# Patient Record
Sex: Female | Born: 1965 | Race: White | Hispanic: No | Marital: Married | Smoking: Never smoker
Health system: Southern US, Community
[De-identification: ages and names within clinical notes are randomized; demographics above are authoritative.]

## PROBLEM LIST (undated history)

## (undated) DIAGNOSIS — I10 Essential (primary) hypertension: Secondary | ICD-10-CM

## (undated) DIAGNOSIS — L259 Unspecified contact dermatitis, unspecified cause: Secondary | ICD-10-CM

## (undated) DIAGNOSIS — T4145XA Adverse effect of unspecified anesthetic, initial encounter: Secondary | ICD-10-CM

## (undated) DIAGNOSIS — Z8042 Family history of malignant neoplasm of prostate: Secondary | ICD-10-CM

## (undated) DIAGNOSIS — Z923 Personal history of irradiation: Secondary | ICD-10-CM

## (undated) DIAGNOSIS — L719 Rosacea, unspecified: Secondary | ICD-10-CM

## (undated) DIAGNOSIS — C801 Malignant (primary) neoplasm, unspecified: Secondary | ICD-10-CM

## (undated) DIAGNOSIS — R109 Unspecified abdominal pain: Secondary | ICD-10-CM

## (undated) DIAGNOSIS — N949 Unspecified condition associated with female genital organs and menstrual cycle: Secondary | ICD-10-CM

## (undated) DIAGNOSIS — M545 Low back pain, unspecified: Secondary | ICD-10-CM

## (undated) DIAGNOSIS — E782 Mixed hyperlipidemia: Secondary | ICD-10-CM

## (undated) DIAGNOSIS — F411 Generalized anxiety disorder: Secondary | ICD-10-CM

## (undated) DIAGNOSIS — Z808 Family history of malignant neoplasm of other organs or systems: Secondary | ICD-10-CM

## (undated) DIAGNOSIS — E7800 Pure hypercholesterolemia, unspecified: Secondary | ICD-10-CM

## (undated) DIAGNOSIS — Z9221 Personal history of antineoplastic chemotherapy: Secondary | ICD-10-CM

## (undated) DIAGNOSIS — K219 Gastro-esophageal reflux disease without esophagitis: Secondary | ICD-10-CM

## (undated) DIAGNOSIS — Z800 Family history of malignant neoplasm of digestive organs: Secondary | ICD-10-CM

## (undated) DIAGNOSIS — R319 Hematuria, unspecified: Secondary | ICD-10-CM

## (undated) DIAGNOSIS — T8859XA Other complications of anesthesia, initial encounter: Secondary | ICD-10-CM

## (undated) HISTORY — DX: Unspecified contact dermatitis, unspecified cause: L25.9

## (undated) HISTORY — PX: DIAGNOSTIC LAPAROSCOPY: SUR761

## (undated) HISTORY — DX: Family history of malignant neoplasm of prostate: Z80.42

## (undated) HISTORY — DX: Rosacea, unspecified: L71.9

## (undated) HISTORY — DX: Hematuria, unspecified: R31.9

## (undated) HISTORY — DX: Family history of malignant neoplasm of other organs or systems: Z80.8

## (undated) HISTORY — DX: Mixed hyperlipidemia: E78.2

## (undated) HISTORY — DX: Unspecified condition associated with female genital organs and menstrual cycle: N94.9

## (undated) HISTORY — DX: Essential (primary) hypertension: I10

## (undated) HISTORY — PX: MASTECTOMY: SHX3

## (undated) HISTORY — DX: Generalized anxiety disorder: F41.1

## (undated) HISTORY — PX: UTERINE FIBROID SURGERY: SHX826

## (undated) HISTORY — DX: Pure hypercholesterolemia, unspecified: E78.00

## (undated) HISTORY — PX: LAPAROSCOPIC APPENDECTOMY: SUR753

## (undated) HISTORY — PX: EYE SURGERY: SHX253

## (undated) HISTORY — DX: Family history of malignant neoplasm of digestive organs: Z80.0

## (undated) HISTORY — DX: Unspecified abdominal pain: R10.9

---

## 2002-03-14 ENCOUNTER — Ambulatory Visit (HOSPITAL_COMMUNITY): Admission: RE | Admit: 2002-03-14 | Discharge: 2002-03-14 | Payer: Self-pay | Admitting: Obstetrics and Gynecology

## 2002-03-14 ENCOUNTER — Encounter (INDEPENDENT_AMBULATORY_CARE_PROVIDER_SITE_OTHER): Payer: Self-pay | Admitting: Specialist

## 2003-01-18 ENCOUNTER — Other Ambulatory Visit: Admission: RE | Admit: 2003-01-18 | Discharge: 2003-01-18 | Payer: Self-pay | Admitting: Obstetrics and Gynecology

## 2003-12-27 ENCOUNTER — Inpatient Hospital Stay (HOSPITAL_COMMUNITY): Admission: AD | Admit: 2003-12-27 | Discharge: 2003-12-29 | Payer: Self-pay | Admitting: Obstetrics and Gynecology

## 2003-12-30 ENCOUNTER — Inpatient Hospital Stay (HOSPITAL_COMMUNITY): Admission: AD | Admit: 2003-12-30 | Discharge: 2004-01-03 | Payer: Self-pay | Admitting: Obstetrics and Gynecology

## 2003-12-31 ENCOUNTER — Encounter (INDEPENDENT_AMBULATORY_CARE_PROVIDER_SITE_OTHER): Payer: Self-pay | Admitting: Specialist

## 2004-01-19 ENCOUNTER — Other Ambulatory Visit: Admission: RE | Admit: 2004-01-19 | Discharge: 2004-01-19 | Payer: Self-pay | Admitting: Obstetrics and Gynecology

## 2005-02-26 ENCOUNTER — Other Ambulatory Visit: Admission: RE | Admit: 2005-02-26 | Discharge: 2005-02-26 | Payer: Self-pay | Admitting: Obstetrics and Gynecology

## 2006-01-27 ENCOUNTER — Observation Stay (HOSPITAL_COMMUNITY): Admission: EM | Admit: 2006-01-27 | Discharge: 2006-01-28 | Payer: Self-pay | Admitting: Emergency Medicine

## 2006-01-27 ENCOUNTER — Encounter (INDEPENDENT_AMBULATORY_CARE_PROVIDER_SITE_OTHER): Payer: Self-pay | Admitting: *Deleted

## 2009-10-25 ENCOUNTER — Emergency Department (HOSPITAL_BASED_OUTPATIENT_CLINIC_OR_DEPARTMENT_OTHER): Admission: EM | Admit: 2009-10-25 | Discharge: 2009-10-25 | Payer: Self-pay | Admitting: Emergency Medicine

## 2010-08-18 ENCOUNTER — Encounter: Payer: Self-pay | Admitting: Emergency Medicine

## 2010-11-13 ENCOUNTER — Ambulatory Visit
Admission: RE | Admit: 2010-11-13 | Discharge: 2010-11-13 | Disposition: A | Discharge status: Home / Self Care | Payer: BC Managed Care – PPO | Source: Ambulatory Visit | Attending: Family Medicine | Admitting: Family Medicine

## 2010-11-13 ENCOUNTER — Other Ambulatory Visit: Payer: Self-pay | Admitting: Family Medicine

## 2010-11-13 DIAGNOSIS — S161XXA Strain of muscle, fascia and tendon at neck level, initial encounter: Secondary | ICD-10-CM

## 2010-12-13 NOTE — Op Note (Signed)
Claire Guzman, Claire Guzman                      ACCOUNT NO.:  [id number]   MEDICAL RECORD NO.:  [id number]                   PATIENT TYPE:  INP   LOCATION:  9305                                 FACILITY:  [facility]   PHYSICIAN:  Randye Lobo, M.D.                DATE OF BIRTH:  11/28/1965   DATE OF PROCEDURE:  12/31/2003  DATE OF DISCHARGE:                                 OPERATIVE REPORT   PREOPERATIVE DIAGNOSES:  1. Intrauterine gestation at 32 plus six weeks.  2. Preterm labor, failed tocolysis.  3. Homero Fellers breech presentation.  4. Chronic hypertension.  5. Oligohydramnios.   POSTOPERATIVE DIAGNOSES:  1. Intrauterine gestation at 32 plus six weeks.  2. Preterm labor, failed tocolysis.  3. Homero Fellers breech presentation.  4. Chronic hypertension.  5. Oligohydramnios.   PROCEDURE:  Primary low segment transverse cesarean section, with T incision  extension both superiorly and inferiorly.   SURGEON:  Randye Lobo, M.D.   ANESTHESIA:  Spinal with __________, serofluorane inhalation, 3% Nesacaine  intraperitoneal bath.   IV FLUIDS:  2200 cc Ringer's lactate.   ESTIMATED BLOOD LOSS:  1000 cc.   URINE OUTPUT:  900 cc.   COMPLICATIONS:  None.   INDICATIONS FOR PROCEDURE:  The patient is a 45 year old gravida 3, para 0-0-  2-0 Caucasian female who was admitted on December 30, 2003 with uterine  contractions.  The patient had been recently discharged from the [facility] on December 29, 2003, at which time she had treatment for  preterm labor and a course of betamethasone.  The patient's antepartum  course had also been significant for chronic hypertension, for which she was  receiving Aldomet and she also had advanced maternal age status; declining  amniocentesis.  On admission, her cervix was noted to be closed and she was  having contractions every 4-10 mins.  The patient did receive terbutaline  subcutaneously and then oral Procardia, which did not lessen her  contractions.  She was therefore admitted for magnesium sulfate.  The  patient did have an OB ultrasound documenting an estimated fetal weight of  2180 to 2306 g, consistent with a 90-95th percentile.  The amniotic fluid  index was noted to be 7.9, and the fetal presentation was noted to be frank  breech.  The patient's group B strep culture from her prior admission was  noted to be negative.   The patient was treated with magnesium sulfate and Stadol for control of her  contractions.  At one point the magnesium sulfate needed to be decreased due  to the patient's respiratory symptoms.  The patient was also treated with  ampicillin intravenously for antibiotic prophylaxis due to the preterm  status.   The patient's contractions did stabilize, but returned again on December 31, 2003; she developed bloody show.  The patient was checked and her cervix was  noted to be 3-4 cm dilated and 100% effaced,  with the membranes intact.  The  breech presentation was confirmed by ultrasound examination.  The patient  was diagnosed with preterm labor, which had failed tocolysis.  A plan was  then made to proceed at this time with a primary cesarean section -- after  the risks, benefits and alternatives were discussed with the patient and her  husband.  They chose to proceed.   FINDINGS:  A viable female infant was delivered at 67.  The position was  frank breech.  The Apgar's were 6 at one minute and 8 at five minutes.  The  weight was later noted to be 4 pounds 1 ounce.  The amniotic fluid was  clear.  The uterus, tubes and ovaries were normal.  The lower uterine  segment was noted to be thick and the baby had the breech low in the pelvis.  The baby was escorted to the newborn nursery in vigorous condition, and did  not require intubation.  The cord pH was 7.31.   The patient will need cesarean section and avoidance of labor for future  deliveries.   SPECIMENS:  The placenta was sent to  pathology.   DESCRIPTION OF PROCEDURE:  The patient was reidentified and her antenatal  _________ was taken down to the operating room.  The patient did receive a  spinal anesthetic and was then placed in the supine position with a left  lateral tilt.  The abdomen was sterilely prepped and a Foley catheter was  placed in the bladder.  She was sterilely draped.   A Pfannenstiel skin incision was created sharply with the scalpel.  This was  carried down to the fascia using monopolar cautery.  The fascia was incised  in the midline with a scalpel, and the incision was extended bilaterally  using the Mayo scissors.  Hemostasis was created in the subcutaneous tissue  with monopolar cautery.  Then the rectus muscles were dissected off of the  overlying fascia, and the rectus muscles were then sharply divided in the  midline.  The parietoperitoneum was grasped with hemostat clamps and was  entered sharply.  The incision was extended cranially and caudally, and a  small extension was also created in the peritoneum off to the right hand  side.   The lower uterine segment was exposed with a bladder retractor, and a  bladder flap was created.  A transverse lower uterine segment incision was  performed sharply with a scalpel.  The myometrium was noted to be quite  thick in this region.  The uterine cavity was entered bluntly and the  incision was extended bilaterally in an upward fashion, using the bandage  scissors.  A hand was inserted through the uterine incision and the breech  was noted to be low, and the uterus was contracting around the fetus.  The  uterine incision was extended superiorly in a T-shaped fashion, using the  bandage scissors.  This did not provide adequate uterine relaxation, and so  the uterine incision was extended inferiorly similarly with the T-incision,  while nitroglycerin 400 mcg sublingually were administered by the anesthesia team.  The breech was then elevated up  through the uterine incision and each  of the legs were delivered, followed by each of the arms which were rotated  across the torso.  The head was delivered last.  The nares and mouth were  suctioned and the cord was doubly clamped and cut; the newborn was carried  over to the awaiting pediatricians.  A cord  pH and cord blood were obtained,  and the patient did receive Ancef 1 g intravenously.  The placenta was  manually extracted and was sent to pathology.   The uterus was exteriorized for its closure.  The superior T-extension was  closed with a running, locked suture of #1 chromic.  The inferior T  extension was similarly closed with a running, locked closure of #1 chromic.  The transverse incision was closed, again with a running, locked suture of  #1 chromic.  The T-incision superiorly and the transverse portion of the  incisions were each closed with a double-layered closure of #1 chromic in an  embrocating fashion.  This provided good hemostasis.   When an attempt was made to return the uterus to the peritoneal cavity, the  patient's rectus muscles were very contracted making it difficult to do  this.  The rectus muscles bilaterally needed to be partially divided with  the monopolar cautery, to gain additional access and ultimately serofluorane  was also administered as inhalation.  The uterus was successfully returned  to the peritoneal cavity.  The patient was having some discomfort and the  __________ spinal was supplemented with 3% Nesacaine as an intraperitoneal  bath.   Blood and clots were suctioned from the peritoneal cavity, and the abdomen  was then closed.  The peritoneum was closed with a running suture of 3-0  Vicryl.  The rectus muscles were reapproximated with figure-of-eight and  simple sutures of #1 chromic.  The rectus muscles were then brought together  in the midline with interrupted sutures of #1 chromic.   The fascia was closed as a running suture of 0  Vicryl.  The subcutaneous  tissue was irrigated and suctioned, and made hemostatic with monopolar  cautery.  Interrupted sutures of 3-0 plain were placed in the subcutaneous  layer, and the skin was closed with staples.  A sterile pressure bandage was  placed over this.   The patient was escorted to the recovery room in stable and awake condition.  There were no complications to the procedure.  All sponge, needle and  instrument counts were correct.                                               Randye Lobo, M.D.    BES/MEDQ  D:  12/31/2003  T:  01/01/2004  Job:  [id number]

## 2010-12-13 NOTE — Discharge Summary (Signed)
Claire Guzman, Claire Guzman                      ACCOUNT NO.:  [id number]   MEDICAL RECORD NO.:  [id number]                   PATIENT TYPE:  INP   LOCATION:  9157                                 FACILITY:  [facility]   PHYSICIAN:  Randye Lobo, M.D.                DATE OF BIRTH:  Nov 17, 1965   DATE OF ADMISSION:  12/27/2003  DATE OF DISCHARGE:  12/29/2003                                 DISCHARGE SUMMARY   FINAL DIAGNOSES:  1. Intrauterine pregnancy at [redacted] weeks gestation.  2. Preterm labor.  3. Chronic hypertension.  4. Rubella nonimmune.  5. Advanced maternal age.   This 45 year old G3 P0-0-2-0 presents at [redacted] weeks gestation to the office  with lower abdominal and back pain.  On exam contractions were noted to be  about every 3-4 minutes and the cervix was about fingertip dilated, 80%  effaced, at a -1 station.  The patient at this point was sent to the [facility] for further evaluation.  She was started on subcu terbutaline and  IV fluids.  At this point the patient was still contracting.  She was  admitted for IV magnesium sulfate, betamethasone protocol, and to be  monitored.  The patient also has a history of chronic hypertension.  Blood  pressure upon admission was 165/90.  Group B strep cultures were obtained at  this point.  The patient was started on IV magnesium sulfate 2 g/hour and  Stadol for pain control.  By hospital day #2 the patient's contractions had  slowed down and she was able to be weaned off her magnesium sulfate.  She  was started on oral terbutaline 2.5 mg.  Blood pressures were all normal at  this point.  She had a good fetal heart rate with accelerations and she was  felt ready for discharge.  She had received her two doses of betamethasone.  She was sent home on hospital day #3 with her terbutaline 2.5 mg one q.4h.,  told to continue her usual Aldomet 250 t.i.d., preterm labor precautions  were reviewed.  She was to follow up in the office in 4 days; of  course, to  call with any increased contractions, pain, or any other complaints.   LABORATORY DATA ON DISCHARGE:  The patient had a hemoglobin of 11.4, white  blood cell count of 13.3.     Leilani Able, P.A.-C.                Randye Lobo, M.D.    MB/MEDQ  D:  01/22/2004  T:  01/22/2004  Job:  [id number]

## 2010-12-13 NOTE — Op Note (Signed)
NAMEESLI, CLEMENTS                      ACCOUNT NO.:  [id number]   MEDICAL RECORD NO.:  [id number]                   PATIENT TYPE:  AMB   LOCATION:  SDC                                  FACILITY:  [facility]   PHYSICIAN:  Mark E. Dareen Piano, M.D.              DATE OF BIRTH:  Dec 14, 1965   DATE OF PROCEDURE:  03/14/2002  DATE OF DISCHARGE:                                 OPERATIVE REPORT   PREOPERATIVE DIAGNOSES:  1. Dysmenorrhea.  2. Pelvic pain.  3. Dyspareunia.  4. Menorrhagia.  5. History of infertility.   POSTOPERATIVE DIAGNOSES:  1. Dysmenorrhea.  2. Pelvic pain.  3. Dyspareunia.  4. Menorrhagia.  5. History of infertility.  6. Endometriosis.   PROCEDURES:  1. Hysteroscopy.  2. Dilation and curettage.  3. Diagnostic laparoscopy with laser ablation of endometriosis and removal     of peritoneal implants.  4. Chromotubation.   ANESTHESIA:  General endotracheal.   SURGEON:  Mark E. Dareen Piano, M.D.   Threasa HeadsMarland Kitchen  Henderson Cloud.   MEDICATIONS:  Antibiotic, Ancef 1g.   ESTIMATED BLOOD LOSS:  Minimal.   COMPLICATIONS:  None.   SPECIMENS:  Peritoneum with endometriosis of anterior cul-de-sac sent to  pathology.   FINDINGS:  The patient had normal-appearing liver and gallbladder.  The  appendix appeared to be normal.  In the anterior cul-de-sac there were two  areas of endometriosis.  There was also endometriosis on the anterior fundal  uterine surface.  Fallopian tubes are normal bilaterally.  The ovaries were  normal bilaterally.  The patient had endometriosis in her posterior cul-de-  sac, mostly involving her left uterosacral ligament and pelvic side wall.  The right pelvic side wall appeared to have no evidence of endometriosis.  The ureters were seen on both sides and appear to be functioning normally.  The spleen was not visualized.   PROCEDURE:  The patient was taken to the operating room where she was placed  in a dorsal supine position and general anesthetic  was administered without  complications.  She was then placed in a dorsal lithotomy position and  prepped with Hibiclens.  Her bladder was straight drained with a red rubber  catheter.  A single-tooth tenaculum was applied to the anterior cervical  lip.  The patient was draped in the usual fashion for this procedure.  A  sterile speculum was placed in the vagina.  The cervical os was then  serially dilated to a 59 [place].  The hysteroscope was then advanced through  the endocervical canal which appeared to be normal and entering the uterine  cavity both ostia were visualized.  There was no evidence of any adhesions  or fibroids.  The uterus appeared to be somewhat arcuate; however, no septum  or findings consistent with a bicornuate uterus were identified.  At this  point, the scope was removed and dilation curettage was performed with  specimen sent to pathology.  Endometrial curettings were  obtained.  At this  point, attention was then turned to the umbilicus where the umbilicus was  injected with 0.25% Marcaine.  A vertical skin incision was made.  A Veress  needle was placed in the peritoneal cavity.  Unacceptable pressures were  obtained when the gas was turned on, therefore, an open laparoscopy was  performed.  The fascia was grasped and entered with the Mayos.  Parietal  peritoneum was entered sharply, 0 Vicryl sutures were placed in the fascia,  and the Hasson cannula then placed in the peritoneal cavity.  Three liters  of carbon dioxide was then placed in the peritoneal cavity.  The patient  then had scope placed and findings were noted as above.  At this point, the  patient was then placed in Trendelenburg.  A 5-mm port was placed in the  suprapubic region under direct visualization.  The YAG laser was then set up  at 15 watts continuous and the scalpel tip used to treat the area in the  anterior cul-de-sac.  The peritoneum was grasped and circumscribed with the  laser.  The  specimen was then handed off.  The implants on the serosal  surface of the fundus of the uterus were then treated also with the laser.  Next, attention was then turned to the posterior cul-de-sac where the left  pelvic side wall and uterosacral ligaments were treated with the round tip  at 15 watts.  The endometriosis was all vaporized in these areas.  There was  a small peritoneal window near the right uterosacral ligaments.  No obvious  endometriosis was seen in this area.  The left ovary was also somewhat  difficult to flip to see the undersurface.  However, no adhesions or  endometriosis were identified.  At this point, the procedure was concluded,  instruments removed, the pneumoperitoneum was released, and the fascia was  closed with interrupted 0 Vicryl sutures.  The skin was closed with 4-0  Vicryl suture.  Suprapubic site was closed with Dermabond.  At this point,  the patient was extubated and taken to the recovery room in stable  condition.  Sponge, instrument, lap, and needle counts were correct x1.  The  patient was discharged to home.  She will be sent home with Tylox to take  p.r.n.  She will be expected to follow up in the office in three weeks.                                                  Mark E. Dareen Piano, M.D.    MEA/MEDQ  D:  03/14/2002  T:  03/15/2002  Job:  [id number]

## 2010-12-13 NOTE — Op Note (Signed)
Claire Guzman, Claire Guzman            ACCOUNT NO.:  [id number]   MEDICAL RECORD NO.:  [id number]          PATIENT TYPE:  INP   LOCATION:  0098                         [facility]ACILITY:  [facility]   PHYSICIAN:  Maisie Fus A. Cornett, M.D.DATE OF BIRTH:  [birth year]   DATE OF PROCEDURE:  01/27/2006  DATE OF DISCHARGE:                                 OPERATIVE REPORT   PREOPERATIVE DIAGNOSIS:  Acute appendicitis.   POSTOPERATIVE DIAGNOSIS:  Acute appendicitis.   PROCEDURE:  Laparoscopic appendectomy.   SURGEON:  Harriette Bouillon, MD   ANESTHESIA:  General endotracheal anesthesia with 10 mL of 0.25% Sensorcaine  local with epinephrine.   ESTIMATED BLOOD LOSS:  20 mL.   SPECIMEN:  Appendix.   INDICATION FOR PROCEDURE:  The patient is a 45 year old female with right  lower quadrant pain.  CT scan revealed appendicitis.  I recommend a  laparoscopic appendectomy after examining her and reviewing her studies and  she agreed to proceed.  Risks were discussed as well as potential  complications and long-term outcomes and recovery expectations.  She voiced  her understanding and agreed to proceed.   DESCRIPTION OF PROCEDURE:  The patient was brought to the operating room and  placed supine.  After induction of general endotracheal anesthesia, the  abdomen was prepped and draped in a sterile fashion.  A 1-cm supraumbilical  incision was made.  Dissection was carried down to her fascia.  Her fascia  was opened with a scalpel blade.  I placed my finger through the fascia and  pushed through the peritoneal lining into the abdominal cavity.  A  pursestring suture of 0 Vicryl was placed and a 12-mm Hasson cannula was  placed under direct vision.  Pneumoperitoneum was created at 15 mmHg with  CO2 and a laparoscope was placed.  Laparoscopy was performed, no evidence of  solid or hollow organ injury with insertion of this port.  An 11-mm port was  placed in the right mid-abdomen under direct vision with local  anesthesia.  A 5-mm port was placed in between the umbilicus and pubic symphysis under  direct vision.  The cecum was identified and the patient was placed in  reversed Trendelenburg and rolled to her left.  The appendix was identified  and showed signs of early acute appendicitis.  The tip of the appendix was  grasped and the mesentery of the appendix was taken down with the harmonic  scalpel all way down to the base at this junction of the appendix and cecum.  A GIA 45 stapling device was then placed across the base of the appendix,  fired and the appendix was amputated and it was placed in an EndoCatch bag  and passed off the field.  The base the appendix was examined and found to  be hemostatic and the staples were in place with good closure of the  appendiceal base.  Irrigation was used and was suctioned out.  No signs of  some hollow or solid organ injury at this point in time.  Ports were  subsequently removed with no signs of port site bleeding and the CO2 was  allowed to  escape.  The umbilical port was then closed with a pursestring  suture of 0 Vicryl;  4-0 Monocryl was used to close all skin incisions.  All  final counts of sponge, needle and instruments were found be correct at this  portion of the  case.  The appendix was sent to Pathology for evaluation.  Steri-Strips and  dry dressings were applied.  The patient tolerated the procedure well and  was taken to Recovery in satisfactory condition.  All counts were found to  be correct x2.      Thomas A. Cornett, M.D.  Electronically Signed     TAC/MEDQ  D:  01/27/2006  T:  01/27/2006  Job:  [id number]

## 2010-12-13 NOTE — Discharge Summary (Signed)
Claire Guzman, Claire Guzman                      ACCOUNT NO.:  [id number]   MEDICAL RECORD NO.:  [id number]                   PATIENT TYPE:  INP   LOCATION:  9305                                 FACILITY:  [facility]   PHYSICIAN:  Malva Limes, M.D.                 DATE OF BIRTH:  11-26-1965   DATE OF ADMISSION:  12/30/2003  DATE OF DISCHARGE:  01/03/2004                                 DISCHARGE SUMMARY   FINAL DIAGNOSES:  1. Intrauterine pregnancy at 32 and six-sevenths weeks gestation.  2. Preterm labor.  3. Failed tocolysis.  4. Homero Fellers breech presentation.  5. Chronic hypertension.  6. Oligohydramnios.  7. Advanced maternal age.   PROCEDURE:  Primary low transverse cesarean section with T incision  extension both superiorly and inferiorly.  Surgeon:  Dr. Conley Simmonds.  Complications:  None.   This 45 year old G3 P0-0-2-0 presents on December 30, 2003 with contractions.  The patient had been recently discharged from the hospital on June 3 where  she was treated with preterm labor and had received already a course of  betamethasone.  The patient's antepartum course had also been complicated  for her chronic hypertension which was stable on Aldomet 250 mg t.i.d.  The  patient also had advanced maternal age but did decline amniocentesis.  The  patient also had a nonimmune rubella status, declining amniocentesis.  Upon  admission the patient's cervix was noted to be closed but she was  contracting every 4-10 minutes.  She received subcu terbutaline then oral  Procardia which did not help her contractions and therefore she was admitted  for magnesium sulfate. The patient did have an OB ultrasound which did show  a fetal weight consistent with the 90-95th percentile for this dating and  also had an AFI of 7.9.  Fetal presentation was also shown to be frank  breech.  The patient had a negative group B strep culture which had just  been performed at her last hospital admission.  She was treated  with  magnesium sulfate and Stadol for contractions but at one point the magnesium  sulfate had to be decreased secondary to the patient's respiratory symptoms.  The patient was also started on ampicillin IV for antibiotic prophylaxis.  The patient's contractions stabilized but returned again on June 5 and she  also developed some bloody show.  Upon checking her cervix it was about 3-4  cm, 100% effaced, and breech presentation was confirmed again by ultrasound.  The patient was diagnosed with preterm labor which had failed tocolysis and  at this point a decision was made to proceed with a cesarean section.  The  patient was taken to the operating room by Dr. Conley Simmonds where a primary  low transverse cesarean section was performed with the delivery of a 4-pound  1-ounce female infant with Apgars of 6 and 8.  During delivery the breech  presentation was noted to be low and  the uterus was contracting around the  fetus.  At this point the uterine incision was extended superiorly in a T  fashion and this did not provide adequate uterine relaxation so it was also  incised and extended inferiorly similarly with a T incision.  At this point  the legs were able to be delivered followed by the arms and the torso and  the head last.  The baby was handed off to the pediatrician team waiting and  the rest of the delivery went without complications.  The patient's  postoperative course was benign.  She did have some postoperative anemia and  was started on iron.  A discussion was held with the patient and her husband  regarding the T incision performed at the cesarean section, that all future  pregnancies would need to be by a cesarean section.  The patient was felt  ready for discharge on postoperative day #3.  She was sent home on a regular  diet, told to decrease activities, told to continue her prenatal vitamins  and iron, was given Tylox one to two q.4h. as needed for pain, was to follow  up in  the office in 4 weeks, of course was to call with any increased pain  or fevers.   LABORATORY DATA ON DISCHARGE:  The patient had a hemoglobin of 8.6, a white  blood cell count of 14.5.     Leilani Able, P.A.-C.                Malva Limes, M.D.    MB/MEDQ  D:  01/22/2004  T:  01/22/2004  Job:  [id number]

## 2010-12-13 NOTE — Consult Note (Signed)
NAMENICLOE, FRONTERA            ACCOUNT NO.:  [id number]   MEDICAL RECORD NO.:  [id number]          PATIENT TYPE:  EMS   LOCATION:  ED                           FACILITY:  [facility]   PHYSICIAN:  Thomas A. Cornett, M.D.DATE OF BIRTH:  02/09/1966   DATE OF CONSULTATION:  01/27/2006  DATE OF DISCHARGE:                                   CONSULTATION   CHIEF COMPLAINT:  Abdominal pain.   HISTORY OF PRESENT ILLNESS:  The patient is a 45 year old female with a 1-  day history of periumbilical, now right lower quadrant abdominal pain.  The  pain is a 7/10 in intensity with no radiation.  She has discomfort in her  right lower quadrant when you press on her left lower quadrant.  It is  associated with nausea, no vomiting.  She was seen earlier today by her  gynecologist and an ultrasound revealed fluid in her pelvis and a left  ovarian cyst.  However, she came to the emergency room though because her  pain worsened and she also had an elevation in her white count.  I was asked  to see the patient at the request of Dr. Denton Lank on consultation for  abdominal pain.   PAST MEDICAL HISTORY:  Hypertension.   PAST SURGICAL HISTORY:  C-section and laparoscopy.   ALLERGIES:  None.   FAMILY HISTORY:  Positive for hypertension.   MEDICATIONS:  Toprol XL 50 mg a day.   DRUG ALLERGIES:  None.   SOCIAL HISTORY:  Married.  No tobacco use.   REVIEW OF SYSTEMS:  Negative times 10; otherwise, as above.   PHYSICAL EXAMINATION:  Temperature 98.4, pulse 82, blood pressure 129/79,  respiratory rate 20.  GENERAL APPEARANCE:  White female in no apparent distress.  HEENT:  Extraocular movements are intact.  Pupils are equal, round, and  reactive to light.  Oropharynx is clear.  No evidence of scleral icterus.  NECK:  Supple and nontender without mass or thyromegaly.  No JVD.  CHEST:  Clear to auscultation.  Chest wall motion is normal.  CARDIOVASCULAR:  Regular rate and rhythm without rub, murmur, or  gallop.  Pulses and extremities are 2+ and extremities are warm.  ABDOMEN:  Tender at McBurney's point with rebound and guarding.  No mass.  Otherwise, the abdomen is soft.  EXTREMITIES:  Muscle tone is normal.  Range of motion is normal.  NEUROLOGICAL EXAMINATION:  Motor and sensory function are grossly intact.  Cranial nerves 2-12 are grossly intact.   LABORATORY STUDIES:  Abdominal CT scan reveals a roughly 2-cm left ovarian  cyst.  There is thickening and dilatation of the appendix consistent with  early appendicitis.  White count is 13,000.  Urine pregnancy test is  negative.  Hemoglobin is 13, hematocrit is 37, platelet count is 224,000.  Sodium 137, potassium 3.3, chloride 105, carbon dioxide 24, glucose 115,  BUN, creatinine 0.6.  Urinalysis is otherwise negative.   IMPRESSION:  Acute appendicitis.   PLAN:  I recommended a laparoscopic appendectomy with her for this.  She  understands and agrees to proceed.  A consent was obtained and risks  were  discussed including bleeding, infection, injury to small bowel, large bowel,  and other intra-abdominal organs.  Risks also were discussed of injury to  abdominal wall with bleeding and hernia formation.  She has no questions and  understands the need for possible conversion.  We will do this as soon as  possible.      Thomas A. Cornett, M.D.  Electronically Signed     TAC/MEDQ  D:  01/27/2006  T:  01/27/2006  Job:  [id number]   cc:   Trudi Ida. Denton Lank, M.D.  Fax: [phone]

## 2011-05-20 ENCOUNTER — Emergency Department (HOSPITAL_COMMUNITY)
Admission: EM | Admit: 2011-05-20 | Discharge: 2011-05-20 | Disposition: A | Discharge status: Home / Self Care | Payer: BC Managed Care – PPO | Attending: Emergency Medicine | Admitting: Emergency Medicine

## 2011-05-20 ENCOUNTER — Emergency Department (HOSPITAL_COMMUNITY): Payer: BC Managed Care – PPO

## 2011-05-20 DIAGNOSIS — M549 Dorsalgia, unspecified: Secondary | ICD-10-CM | POA: Insufficient documentation

## 2011-05-20 DIAGNOSIS — Z79899 Other long term (current) drug therapy: Secondary | ICD-10-CM | POA: Insufficient documentation

## 2011-05-20 DIAGNOSIS — M4120 Other idiopathic scoliosis, site unspecified: Secondary | ICD-10-CM | POA: Insufficient documentation

## 2011-05-20 DIAGNOSIS — I10 Essential (primary) hypertension: Secondary | ICD-10-CM | POA: Insufficient documentation

## 2011-05-20 LAB — CBC
MCH: 31.6 pg (ref 26.0–34.0)
MCV: 88.5 fL (ref 78.0–100.0)
RDW: 11.9 % (ref 11.5–15.5)
WBC: 8.6 10*3/uL (ref 4.0–10.5)

## 2011-05-20 LAB — URINALYSIS, ROUTINE W REFLEX MICROSCOPIC
Ketones, ur: NEGATIVE mg/dL
Leukocytes, UA: NEGATIVE
Protein, ur: NEGATIVE mg/dL
Specific Gravity, Urine: 1.008 (ref 1.005–1.030)
pH: 5.5 (ref 5.0–8.0)

## 2011-05-20 LAB — URINE MICROSCOPIC-ADD ON

## 2011-05-20 LAB — BASIC METABOLIC PANEL
CO2: 26 mEq/L (ref 19–32)
Creatinine, Ser: 0.54 mg/dL (ref 0.50–1.10)
GFR calc non Af Amer: >90 mL/min (ref >90)
Glucose, Bld: 97 mg/dL (ref 70–99)
Potassium: 3.3 mEq/L — ABNORMAL LOW (ref 3.5–5.1)
Sodium: 139 mEq/L (ref 135–145)

## 2011-05-20 LAB — DIFFERENTIAL
Basophils Relative: 0 % (ref 0–1)
Lymphs Abs: 2.5 10*3/uL (ref 0.7–4.0)
Neutro Abs: 5.6 10*3/uL (ref 1.7–7.7)
Neutrophils Relative %: 65 % (ref 43–77)

## 2012-05-10 ENCOUNTER — Other Ambulatory Visit: Payer: Self-pay | Admitting: Family Medicine

## 2012-05-10 DIAGNOSIS — R109 Unspecified abdominal pain: Secondary | ICD-10-CM

## 2012-05-17 ENCOUNTER — Ambulatory Visit
Admission: RE | Admit: 2012-05-17 | Discharge: 2012-05-17 | Disposition: A | Discharge status: Home / Self Care | Payer: BC Managed Care – PPO | Source: Ambulatory Visit | Attending: Family Medicine | Admitting: Family Medicine

## 2012-05-17 DIAGNOSIS — R109 Unspecified abdominal pain: Secondary | ICD-10-CM

## 2013-03-16 ENCOUNTER — Emergency Department (HOSPITAL_COMMUNITY)
Admission: EM | Admit: 2013-03-16 | Discharge: 2013-03-16 | Disposition: A | Discharge status: Home / Self Care | Payer: Self-pay | Attending: Emergency Medicine | Admitting: Emergency Medicine

## 2013-03-16 ENCOUNTER — Encounter (HOSPITAL_COMMUNITY): Payer: Self-pay

## 2013-03-16 ENCOUNTER — Emergency Department (HOSPITAL_COMMUNITY): Payer: Self-pay

## 2013-03-16 DIAGNOSIS — Z9889 Other specified postprocedural states: Secondary | ICD-10-CM | POA: Insufficient documentation

## 2013-03-16 DIAGNOSIS — R51 Headache: Secondary | ICD-10-CM | POA: Insufficient documentation

## 2013-03-16 DIAGNOSIS — F411 Generalized anxiety disorder: Secondary | ICD-10-CM | POA: Insufficient documentation

## 2013-03-16 DIAGNOSIS — R319 Hematuria, unspecified: Secondary | ICD-10-CM | POA: Insufficient documentation

## 2013-03-16 DIAGNOSIS — N898 Other specified noninflammatory disorders of vagina: Secondary | ICD-10-CM | POA: Insufficient documentation

## 2013-03-16 DIAGNOSIS — Z79899 Other long term (current) drug therapy: Secondary | ICD-10-CM | POA: Insufficient documentation

## 2013-03-16 DIAGNOSIS — F329 Major depressive disorder, single episode, unspecified: Secondary | ICD-10-CM | POA: Insufficient documentation

## 2013-03-16 DIAGNOSIS — I10 Essential (primary) hypertension: Secondary | ICD-10-CM | POA: Insufficient documentation

## 2013-03-16 DIAGNOSIS — Z3202 Encounter for pregnancy test, result negative: Secondary | ICD-10-CM | POA: Insufficient documentation

## 2013-03-16 DIAGNOSIS — R011 Cardiac murmur, unspecified: Secondary | ICD-10-CM | POA: Insufficient documentation

## 2013-03-16 DIAGNOSIS — J3489 Other specified disorders of nose and nasal sinuses: Secondary | ICD-10-CM | POA: Insufficient documentation

## 2013-03-16 DIAGNOSIS — F3289 Other specified depressive episodes: Secondary | ICD-10-CM | POA: Insufficient documentation

## 2013-03-16 DIAGNOSIS — R109 Unspecified abdominal pain: Secondary | ICD-10-CM

## 2013-03-16 DIAGNOSIS — K5900 Constipation, unspecified: Secondary | ICD-10-CM | POA: Insufficient documentation

## 2013-03-16 DIAGNOSIS — Z8742 Personal history of other diseases of the female genital tract: Secondary | ICD-10-CM | POA: Insufficient documentation

## 2013-03-16 DIAGNOSIS — R1031 Right lower quadrant pain: Secondary | ICD-10-CM | POA: Insufficient documentation

## 2013-03-16 LAB — URINE MICROSCOPIC-ADD ON

## 2013-03-16 LAB — CBC WITH DIFFERENTIAL/PLATELET
Basophils Absolute: 0 10*3/uL (ref 0.0–0.1)
Basophils Relative: 0 % (ref 0–1)
Eosinophils Absolute: 0.1 10*3/uL (ref 0.0–0.7)
Eosinophils Relative: 2 % (ref 0–5)
HCT: 37.9 % (ref 36.0–46.0)
Hemoglobin: 13.7 g/dL (ref 12.0–15.0)
Lymphocytes Relative: 26 % (ref 12–46)
Lymphs Abs: 2.3 10*3/uL (ref 0.7–4.0)
MCH: 32.1 pg (ref 26.0–34.0)
MCHC: 36.1 g/dL — ABNORMAL HIGH (ref 30.0–36.0)
MCV: 88.8 fL (ref 78.0–100.0)
Monocytes Absolute: 0.6 10*3/uL (ref 0.1–1.0)
Monocytes Relative: 7 % (ref 3–12)
Neutro Abs: 5.9 10*3/uL (ref 1.7–7.7)
Neutrophils Relative %: 66 % (ref 43–77)
Platelets: 212 10*3/uL (ref 150–400)
RBC: 4.27 MIL/uL (ref 3.87–5.11)
RDW: 12.3 % (ref 11.5–15.5)
WBC: 9 10*3/uL (ref 4.0–10.5)

## 2013-03-16 LAB — WET PREP, GENITAL
Clue Cells Wet Prep HPF POC: NONE SEEN
Trich, Wet Prep: NONE SEEN
Yeast Wet Prep HPF POC: NONE SEEN

## 2013-03-16 LAB — POCT I-STAT, CHEM 8
Calcium, Ion: 1.09 mmol/L — ABNORMAL LOW (ref 1.12–1.23)
Chloride: 105 mEq/L (ref 96–112)
Creatinine, Ser: 0.7 mg/dL (ref 0.50–1.10)
Glucose, Bld: 105 mg/dL — ABNORMAL HIGH (ref 70–99)
Potassium: 3.3 mEq/L — ABNORMAL LOW (ref 3.5–5.1)

## 2013-03-16 LAB — URINALYSIS, ROUTINE W REFLEX MICROSCOPIC
Glucose, UA: NEGATIVE mg/dL
Ketones, ur: NEGATIVE mg/dL
Nitrite: NEGATIVE
Specific Gravity, Urine: 1.017 (ref 1.005–1.030)
Urobilinogen, UA: 0.2 mg/dL (ref 0.0–1.0)

## 2013-03-16 MED ORDER — MORPHINE SULFATE 4 MG/ML IJ SOLN
4.0000 mg | Freq: Once | INTRAMUSCULAR | Status: AC
  Administered 2013-03-16: 4 mg via INTRAVENOUS
  Filled 2013-03-16: qty 1

## 2013-03-16 NOTE — ED Provider Notes (Signed)
Medical screening examination/treatment/procedure(s) were conducted as a shared visit with non-physician practitioner(s) and myself.  I personally evaluated the patient during the encounter and agree with physical exam and plan of care.  Patient is a 47 year old female with a history of prior ovarian cysts, hypertension who presents to the emergency department with acute onset of right-sided pelvic pain that started at 1:30 today. She reports she was seen by her primary care physician he reports there was microscopic blood in her urine sent her to the ED. She states that her pain is worse with movement. She ports feels different than her prior ovarian cyst. She denies any fever, nausea, vomiting or diarrhea. No dysuria or gross hematuria. No vaginal bleeding or discharge. On exam, patient is tender to palpation in the right pelvic region and suprapubically. There is no peritoneal signs. Labs are unremarkable. Urinalysis to show a small amount of blood. Will perform pelvic exam and transvaginal ultrasound.  10:34 PM  Pt feels better, asking to eat.  He has been hemodynamically stable. Her urine does show a small amount of hemoglobin but no other sign of infection. Transvaginal ultrasound showed uterine fibroids. Pelvic exam is otherwise unremarkable. Will likely have patient followup in the ED or with her PCP in 24 hours for reevaluation.  Able to tolerate po.  Layla Maw Jahking Lesser, DO 03/16/13 2316

## 2013-03-16 NOTE — ED Notes (Signed)
Pt c/o RLQ pain since 1:30, states saw PCP, states blood in urine, no n/v/d or urinary difficulty

## 2013-03-16 NOTE — Progress Notes (Signed)
Patient reports her pcp is Dr. Docia Chuck of [facility].

## 2013-03-16 NOTE — ED Provider Notes (Signed)
CSN: [id number]     Arrival date & time 03/16/13  1734 History     First MD Initiated Contact with Patient 03/16/13 1834     Chief Complaint  Patient presents with  . Abdominal Pain    HPI  Claire Guzman is a 47 year old female with a PMH of ovarian cysts, endometriosis, anxiety and HTN who presents to the ED for evaluation of abdominal pain.  Patient states that 1:30 PM today she suddenly developed right lower quadrant abdominal pain.  She states that she was at work at the time and her pain has been getting progressively worse since then. She states that her pain radiates to the middle lower abdomen. She describes her pain as a cramping sensation. Her pain is constant with intermittent increases in pain.  She states that movement makes her pain worse.  Resting improves her pain.  She tried taking a suppository because she has been constipated.  She had a bowel movement with no relief in her pain.  She also took a Sudafed last night for a recent sinus infection.  She states she also has a headache due to her sinus infection.  Her pain currently is a 5/10 but was previously a 10/10.  She denies any fever, chills, change in appetite/activity, nausea, emesis, diarrhea, vaginal discharge, vaginal bleeding, dysuria, hematuria, back pain, or vaginal pain.  She states she is due for her menstrual period tomorrow.  Previous abdominal surgeries include a laparoscopic surgery for endometriosis, appendectomy, and cesarean section.  She was seen by her PCP today for a sinus infection and was sent to the ED for her abdominal pain.  He did a urine sample, which was positive for hematuria.     History reviewed. No pertinent past medical history. Past Surgical History  Procedure Laterality Date  . Cesarean section    . Appendectomy     No family history on file. History  Substance Use Topics  . Smoking status: Never Smoker   . Smokeless tobacco: Not on file  . Alcohol Use: No   OB History   Grav  Para Term Preterm Abortions TAB SAB Ect Mult Living                 Review of Systems  Constitutional: Negative for fever, chills, activity change, appetite change and fatigue.  HENT: Positive for congestion and sinus pressure. Negative for ear pain, nosebleeds, sore throat, rhinorrhea, neck pain and neck stiffness.   Eyes: Negative for visual disturbance.  Respiratory: Negative for cough and shortness of breath.   Cardiovascular: Negative for chest pain, palpitations and leg swelling.  Gastrointestinal: Positive for abdominal pain and constipation. Negative for nausea, vomiting, diarrhea, blood in stool and anal bleeding.  Endocrine: Negative for polyuria.  Genitourinary: Negative for dysuria, hematuria, flank pain, vaginal bleeding, vaginal discharge, difficulty urinating, genital sores, vaginal pain and pelvic pain.  Musculoskeletal: Negative for back pain.  Skin: Negative for wound.  Neurological: Positive for headaches. Negative for dizziness, syncope, weakness and light-headedness.    Allergies  Codeine and Tramadol  Home Medications   Current Outpatient Rx  Name  Route  Sig  Dispense  Refill  . ALPRAZolam (XANAX) 0.25 MG tablet   Oral   Take 0.25 mg by mouth at bedtime as needed for sleep or anxiety.         Marland Kitchen atenolol (TENORMIN) 50 MG tablet   Oral   Take 50 mg by mouth at bedtime.         Marland Kitchen  lisinopril (PRINIVIL,ZESTRIL) 5 MG tablet   Oral   Take 5 mg by mouth daily as needed.         . norgestimate-ethinyl estradiol (ORTHO-CYCLEN,SPRINTEC,PREVIFEM) 0.25-35 MG-MCG tablet   Oral   Take 1 tablet by mouth daily.         . pseudoephedrine (SUDAFED) 30 MG tablet   Oral   Take 30 mg by mouth every 4 (four) hours as needed for congestion.          BP 153/62  Pulse 72  Temp(Src) 98.4 F (36.9 C) (Oral)  SpO2 100%  LMP 02/14/2013  Filed Vitals:   03/16/13 1753 03/16/13 2136  BP: 153/62 122/66  Pulse: 72 87  Temp: 98.4 F (36.9 C) 98.8 F (37.1 C)   TempSrc: Oral Oral  Resp:  20  SpO2: 100% 97%    Physical Exam  Nursing note and vitals reviewed. Constitutional: She is oriented to person, place, and time. She appears well-developed and well-nourished. No distress.  HENT:  Head: Normocephalic and atraumatic.  Right Ear: External ear normal.  Left Ear: External ear normal.  Nose: Nose normal.  Mouth/Throat: Oropharynx is clear and moist. No oropharyngeal exudate.  Eyes: Conjunctivae are normal. Pupils are equal, round, and reactive to light. Right eye exhibits no discharge. Left eye exhibits no discharge.  Neck: Normal range of motion. Neck supple.  Cardiovascular: Normal rate, regular rhythm and intact distal pulses.  Exam reveals no gallop and no friction rub.   Murmur heard. Grade 1 holosystolic murmur  Pulmonary/Chest: Effort normal and breath sounds normal. No respiratory distress. She has no wheezes. She has no rales. She exhibits no tenderness.  Abdominal: Soft. Bowel sounds are normal. She exhibits no distension and no mass. There is tenderness. There is guarding. There is no rebound.  RLQ and middle lower abdominal pain to palpation  Genitourinary: Vaginal discharge found.  No CMT.  Minimal amount of thin clear discharge present in the vaginal vault.  Right adnexal tenderness.  No left adnexal tenderness.    Musculoskeletal: Normal range of motion. She exhibits no edema and no tenderness.  Neurological: She is alert and oriented to person, place, and time.  Skin: Skin is warm and dry. She is not diaphoretic.    ED Course   Procedures (including critical care time)  Labs Reviewed  WET PREP, GENITAL - Abnormal; Notable for the following:    WBC, Wet Prep HPF POC FEW (*)    All other components within normal limits  CBC WITH DIFFERENTIAL - Abnormal; Notable for the following:    MCHC 36.1 (*)    All other components within normal limits  URINALYSIS, ROUTINE W REFLEX MICROSCOPIC - Abnormal; Notable for the following:     Hgb urine dipstick SMALL (*)    All other components within normal limits  URINE MICROSCOPIC-ADD ON - Abnormal; Notable for the following:    Bacteria, UA FEW (*)    All other components within normal limits  POCT I-STAT, CHEM 8 - Abnormal; Notable for the following:    Potassium 3.3 (*)    Glucose, Bld 105 (*)    Calcium, Ion 1.09 (*)    All other components within normal limits  GC/CHLAMYDIA PROBE AMP  URINE CULTURE  POCT PREGNANCY, URINE   Results for orders placed during the hospital encounter of 03/16/13  WET PREP, GENITAL      Result Value Range   Yeast Wet Prep HPF POC NONE SEEN  NONE SEEN   Trich, Wet  Prep NONE SEEN  NONE SEEN   Clue Cells Wet Prep HPF POC NONE SEEN  NONE SEEN   WBC, Wet Prep HPF POC FEW (*) NONE SEEN  CBC WITH DIFFERENTIAL      Result Value Range   WBC 9.0  4.0 - 10.5 K/uL   RBC 4.27  3.87 - 5.11 MIL/uL   Hemoglobin 13.7  12.0 - 15.0 g/dL   HCT 09.8  11.9 - 14.7 %   MCV 88.8  78.0 - 100.0 fL   MCH 32.1  26.0 - 34.0 pg   MCHC 36.1 (*) 30.0 - 36.0 g/dL   RDW 82.9  56.2 - 13.0 %   Platelets 212  150 - 400 K/uL   Neutrophils Relative % 66  43 - 77 %   Neutro Abs 5.9  1.7 - 7.7 K/uL   Lymphocytes Relative 26  12 - 46 %   Lymphs Abs 2.3  0.7 - 4.0 K/uL   Monocytes Relative 7  3 - 12 %   Monocytes Absolute 0.6  0.1 - 1.0 K/uL   Eosinophils Relative 2  0 - 5 %   Eosinophils Absolute 0.1  0.0 - 0.7 K/uL   Basophils Relative 0  0 - 1 %   Basophils Absolute 0.0  0.0 - 0.1 K/uL  URINALYSIS, ROUTINE W REFLEX MICROSCOPIC      Result Value Range   Color, Urine YELLOW  YELLOW   APPearance CLEAR  CLEAR   Specific Gravity, Urine 1.017  1.005 - 1.030   pH 7.0  5.0 - 8.0   Glucose, UA NEGATIVE  NEGATIVE mg/dL   Hgb urine dipstick SMALL (*) NEGATIVE   Bilirubin Urine NEGATIVE  NEGATIVE   Ketones, ur NEGATIVE  NEGATIVE mg/dL   Protein, ur NEGATIVE  NEGATIVE mg/dL   Urobilinogen, UA 0.2  0.0 - 1.0 mg/dL   Nitrite NEGATIVE  NEGATIVE   Leukocytes, UA  NEGATIVE  NEGATIVE  URINE MICROSCOPIC-ADD ON      Result Value Range   Squamous Epithelial / LPF RARE  RARE   WBC, UA 0-2  <3 WBC/hpf   RBC / HPF 0-2  <3 RBC/hpf   Bacteria, UA FEW (*) RARE  POCT I-STAT, CHEM 8      Result Value Range   Sodium 141  135 - 145 mEq/L   Potassium 3.3 (*) 3.5 - 5.1 mEq/L   Chloride 105  96 - 112 mEq/L   BUN 9  6 - 23 mg/dL   Creatinine, Ser 8.65  0.50 - 1.10 mg/dL   Glucose, Bld 784 (*) 70 - 99 mg/dL   Calcium, Ion 6.96 (*) 1.12 - 1.23 mmol/L   TCO2 24  0 - 100 mmol/L   Hemoglobin 13.3  12.0 - 15.0 g/dL   HCT 29.5  28.4 - 13.2 %  POCT PREGNANCY, URINE      Result Value Range   Preg Test, Ur NEGATIVE  NEGATIVE       [place] Art/Ven Flow Abd Pelv Doppler (Final result)  Result time: 03/16/13 22:08:56    Final result by Rad Results In Interface (03/16/13 22:08:56)    Narrative:   *RADIOLOGY REPORT*  Clinical Data: Abdominal pain.  TRANSABDOMINAL AND TRANSVAGINAL ULTRASOUND OF PELVIS DOPPLER ULTRASOUND OF OVARIES  Technique: Both transabdominal and transvaginal ultrasound examinations of the pelvis were performed. Transabdominal technique was performed for global imaging of the pelvis including uterus, ovaries, adnexal regions, and pelvic cul-de-sac.  It was necessary to proceed with endovaginal exam following the  transabdominal exam to visualize the ovaries.  Color and duplex Doppler ultrasound was utilized to evaluate blood flow to the ovaries.  Comparison: CT abdomen and pelvis 01/26/2006.  FINDINGS  Uterus: The uterus is anteverted and measures about 8 x 3.8 x 5.2 cm. There is a focal hypoechoic lesion in the posterior subserosal region of the uterine fundus consistent with fibroid. This measures about 12 mm diameter. There are small Nabothian cysts in the cervix.  Endometrium: Endometrial stripe thickness is normal, measuring about 4.6 mm. No abnormal endometrial fluid collections are visualized.  Right ovary: Right ovary measures  3.4 x 2.4 x 2.2 cm. No abnormal adnexal masses. Flow is demonstrated in the right ovary on color flow Doppler imaging.  Left ovary: Left ovary measures 3.4 x 2.2 x 2.6 cm. Visualization is somewhat limited due to adjacent bowel loops. No abnormal adnexal masses are demonstrated. Flow is demonstrated in the left ovary on color flow Doppler imaging.  Pulsed Doppler evaluation demonstrates normal low-resistance arterial and venous waveforms in both ovaries.  No free pelvic fluid collections.  IMPRESSION:  Uterine fibroid. Uterus and ovaries are otherwise unremarkable.  No sonographic evidence for ovarian torsion.   Original Report Authenticated By: Burman Nieves, M.D.     No results found. 1. Abdominal pain     MDM  MARNAE MADANI is a 47 year old female with a PMH of ovarian cysts, endometriosis, anxiety and HTN who presents to the ED for evaluation of abdominal pain.  Urine pregnancy, urinalysis, i-STAT 8, and CBC ordered to further evaluate.    Rechecks  7:40 PM = spoke with patient who states that she has no allergic or adverse reactions to morphine. She states "I got a lot of it when I was delivering my child." 8:37 PM = patient states that she feels much better after morphine.  GC and wet mount sent after pelvic.   9:40 PM = Patient resting comfortably.  States pain is well controlled.  Asking for ice chips which were not given until scan results are back.   10:45 PM = Repeat abdominal exam reveals improved tenderness in the RLQ and middle lower abdomen, however, mild tenderness is present.  States "I feel much better" and declines CT scan at this time.   11:10 PM = Patient able to get dressed and walk around the room without difficulty or increased pain.   11:15 PM = Patient eating crackers and soda with no increase in pain.  Feels she is ready for discharge.    Etiology of abdominal pain unclear at this time.  Ultrasound performed in the emergency department was  negative for ovarian torsion or cysts.  Urine will be sent for culture. Urine negative for urinary tract infection at this time.  Wet mount negative.  Gonorrhea and Chlamydia cultures pending. The patient had improvements in her abdominal pain throughout her ED visit.  She states that she felt well enough for discharge and declined CT scan at this time.  Patient was able to tolerate oral food and fluids before discharge.  Patient will be driven home by her husband who is present in the emergency room and will help observe her overnight.  Patient was instructed to rest and drink fluids.  She was instructed return to the emergency department if she has any repeated emesis, worsening/change in her abdominal pain, fever, chest pain, shortness of breath, or other concerns. She is instructed to call her primary care provider tomorrow for a follow-up visit.  Patient was  in agreement with discharge and plan.  Patient will be discharged home in the care of her husband.    Final impressions: 1. Abdominal pain    Claire Ee Jamaurie Bernier PA-C   Jillyn [place], [place] 03/17/13 0210

## 2013-03-17 ENCOUNTER — Telehealth (HOSPITAL_COMMUNITY): Payer: Self-pay | Admitting: Emergency Medicine

## 2013-03-18 LAB — URINE CULTURE

## 2013-03-19 ENCOUNTER — Telehealth (HOSPITAL_COMMUNITY): Payer: Self-pay | Admitting: Emergency Medicine

## 2013-08-08 ENCOUNTER — Other Ambulatory Visit: Payer: Self-pay | Admitting: Obstetrics and Gynecology

## 2014-02-08 ENCOUNTER — Encounter: Payer: Self-pay | Admitting: *Deleted

## 2014-09-07 ENCOUNTER — Other Ambulatory Visit: Payer: Self-pay | Admitting: Obstetrics and Gynecology

## 2014-09-08 LAB — CYTOLOGY - PAP

## 2015-09-27 ENCOUNTER — Other Ambulatory Visit: Payer: Self-pay | Admitting: Obstetrics and Gynecology

## 2015-09-28 LAB — CYTOLOGY - PAP

## 2015-10-29 DIAGNOSIS — M79605 Pain in left leg: Secondary | ICD-10-CM | POA: Diagnosis not present

## 2015-10-29 DIAGNOSIS — S7012XA Contusion of left thigh, initial encounter: Secondary | ICD-10-CM | POA: Diagnosis not present

## 2015-10-31 DIAGNOSIS — M79652 Pain in left thigh: Secondary | ICD-10-CM | POA: Diagnosis not present

## 2015-11-01 ENCOUNTER — Other Ambulatory Visit: Payer: Self-pay | Admitting: Family Medicine

## 2015-11-01 ENCOUNTER — Ambulatory Visit
Admission: RE | Admit: 2015-11-01 | Discharge: 2015-11-01 | Disposition: A | Discharge status: Home / Self Care | Payer: BLUE CROSS/BLUE SHIELD | Source: Ambulatory Visit | Attending: Family Medicine | Admitting: Family Medicine

## 2015-11-01 DIAGNOSIS — M179 Osteoarthritis of knee, unspecified: Secondary | ICD-10-CM | POA: Diagnosis not present

## 2015-11-01 DIAGNOSIS — M25562 Pain in left knee: Secondary | ICD-10-CM

## 2015-11-07 DIAGNOSIS — M25562 Pain in left knee: Secondary | ICD-10-CM | POA: Diagnosis not present

## 2015-11-21 DIAGNOSIS — D225 Melanocytic nevi of trunk: Secondary | ICD-10-CM | POA: Diagnosis not present

## 2015-11-21 DIAGNOSIS — L821 Other seborrheic keratosis: Secondary | ICD-10-CM | POA: Diagnosis not present

## 2015-11-21 DIAGNOSIS — D485 Neoplasm of uncertain behavior of skin: Secondary | ICD-10-CM | POA: Diagnosis not present

## 2015-11-21 DIAGNOSIS — L812 Freckles: Secondary | ICD-10-CM | POA: Diagnosis not present

## 2015-12-07 DIAGNOSIS — F419 Anxiety disorder, unspecified: Secondary | ICD-10-CM | POA: Diagnosis not present

## 2015-12-07 DIAGNOSIS — R002 Palpitations: Secondary | ICD-10-CM | POA: Diagnosis not present

## 2016-01-03 DIAGNOSIS — N390 Urinary tract infection, site not specified: Secondary | ICD-10-CM | POA: Diagnosis not present

## 2016-01-03 DIAGNOSIS — R309 Painful micturition, unspecified: Secondary | ICD-10-CM | POA: Diagnosis not present

## 2016-01-04 ENCOUNTER — Encounter: Payer: Self-pay | Admitting: Internal Medicine

## 2016-01-04 ENCOUNTER — Ambulatory Visit (INDEPENDENT_AMBULATORY_CARE_PROVIDER_SITE_OTHER): Payer: BLUE CROSS/BLUE SHIELD | Admitting: Internal Medicine

## 2016-01-04 VITALS — BP 136/82 | HR 63 | Ht 64.0 in | Wt 150.2 lb

## 2016-01-04 DIAGNOSIS — R002 Palpitations: Secondary | ICD-10-CM

## 2016-01-04 NOTE — Progress Notes (Signed)
Cardiology Office Note   Date:  01/04/2016   ID:  Claire Guzman, DOB [birth year], MRN [id number]  PCP:  Lujean Amel, MD  Cardiologist:   Dorris Carnes, MD    Pt referred by Dr Dorthy Cooler for palpitations    History of Present Illness: Claire Guzman is a 50 y.o. female with a history of palpitations   Pt says episodes are worse when lies down.  Heart skips then gets going   Episodes may her want to cough   A little better over the fast few wks    Walks dog  Gets a little more tired than usual Had heart palpitations in 20s   Dx HTn at 30   Giving out a little more than usual Memory is terrible       Outpatient Prescriptions Prior to Visit  Medication Sig Dispense Refill  . ALPRAZolam (XANAX) 0.25 MG tablet Take 0.25 mg by mouth at bedtime as needed for sleep or anxiety.    Marland Kitchen atenolol (TENORMIN) 50 MG tablet Take 50 mg by mouth at bedtime.    . norgestimate-ethinyl estradiol (ORTHO-CYCLEN,SPRINTEC,PREVIFEM) 0.25-35 MG-MCG tablet Take 1 tablet by mouth daily.    Marland Kitchen lisinopril (PRINIVIL,ZESTRIL) 5 MG tablet Take 5 mg by mouth daily as needed. Reported on 01/04/2016    . pseudoephedrine (SUDAFED) 30 MG tablet Take 30 mg by mouth every 4 (four) hours as needed for congestion. Reported on 01/04/2016     No facility-administered medications prior to visit.     Allergies:   Codeine; Metrogel; Other; Paxil; Prednisone; Tramadol; and Zomig   Past Medical History  Diagnosis Date  . Essential hypertension, benign   . Mixed hyperlipidemia   . Abdominal pain, unspecified site   . Contact dermatitis and other eczema, due to unspecified cause   . Unspecified symptom associated with female genital organs   . Hematuria, unspecified   . Anxiety state, unspecified   . Rosacea     Past Surgical History  Procedure Laterality Date  . Cesarean section    . Laparoscopic appendectomy       Social History:  The patient  reports that she has never smoked. She does not have any  smokeless tobacco history on file. She reports that she does not drink alcohol.   Family History:  The patient's family history includes Cancer in her paternal grandfather; Heart attack in her maternal grandfather and maternal grandmother; Heart failure in her mother; Stroke in her paternal grandmother.    ROS:  Please see the history of present illness. All other systems are reviewed and  Negative to the above problem except as noted.    PHYSICAL EXAM: VS:  BP 136/82 mmHg  Pulse 63  Ht 5\' 4"  (1.626 m)  Wt 150 lb 3.2 oz (68.13 kg)  BMI 25.77 kg/m2  GEN: Well nourished, well developed, in no acute distress HEENT: normal Neck: no JVD, carotid bruits, or masses Cardiac: RRR; no murmurs, rubs, or gallops,no edema  Respiratory:  clear to auscultation bilaterally, normal work of breathing GI: soft, nontender, nondistended, + BS  No hepatomegaly  MS: no deformity Moving all extremities   Skin: warm and dry, no rash Neuro:  Strength and sensation are intact Psych: euthymic mood, full affect   EKG:  EKG is ordered today.  SR 63 bpm     Lipid Panel No results found for: CHOL, TRIG, HDL, CHOLHDL, VLDL, LDLCALC, LDLDIRECT    Wt Readings from Last 3 Encounters:  01/04/16 150 lb 3.2 oz (  68.13 kg)      ASSESSMENT AND PLAN:  1  Palpitations Will set up for event monitor  Get echo  2  Fatigue / SOB  Set up for echo  Keep doing activites as toelrated     Signed, Dorris Carnes, MD  01/04/2016 12:07 PM    [name], [name], [place]  [address] Phone: [phone]; Fax: [phone]

## 2016-01-04 NOTE — Patient Instructions (Signed)
Medication Instructions:  The current medical regimen is effective;  continue present plan and medications.  Testing/Procedures: Your physician has requested that you have an echocardiogram. Echocardiography is a painless test that uses sound waves to create images of your heart. It provides your doctor with information about the size and shape of your heart and how well your heart's chambers and valves are working. This procedure takes approximately one hour. There are no restrictions for this procedure.  Your physician has recommended that you wear an event monitor for 30 days. Event monitors are medical devices that record the heart's electrical activity. Doctors most often us these monitors to diagnose arrhythmias. Arrhythmias are problems with the speed or rhythm of the heartbeat. The monitor is a small, portable device. You can wear one while you do your normal daily activities. This is usually used to diagnose what is causing palpitations/syncope (passing out).  Follow-Up: Follow up will be based on the results of the above testing.  Thank you for choosing [facility]!!     

## 2016-01-07 NOTE — Progress Notes (Signed)
OV note faxed to Dr. Dorthy Cooler on 01/05/16 accordin to routing history.

## 2016-01-17 DIAGNOSIS — I809 Phlebitis and thrombophlebitis of unspecified site: Secondary | ICD-10-CM | POA: Diagnosis not present

## 2016-01-18 ENCOUNTER — Other Ambulatory Visit: Payer: Self-pay | Admitting: Family Medicine

## 2016-01-18 DIAGNOSIS — I809 Phlebitis and thrombophlebitis of unspecified site: Secondary | ICD-10-CM

## 2016-01-22 ENCOUNTER — Telehealth: Payer: Self-pay | Admitting: Internal Medicine

## 2016-01-22 NOTE — Telephone Encounter (Signed)
Pt wants to reschedule echo test for tomorrow please.

## 2016-01-23 ENCOUNTER — Other Ambulatory Visit (HOSPITAL_COMMUNITY): Payer: BLUE CROSS/BLUE SHIELD

## 2016-02-12 ENCOUNTER — Other Ambulatory Visit: Payer: Self-pay

## 2016-02-12 ENCOUNTER — Ambulatory Visit (HOSPITAL_COMMUNITY): Payer: BLUE CROSS/BLUE SHIELD | Attending: Internal Medicine

## 2016-02-12 DIAGNOSIS — E785 Hyperlipidemia, unspecified: Secondary | ICD-10-CM | POA: Insufficient documentation

## 2016-02-12 DIAGNOSIS — R002 Palpitations: Secondary | ICD-10-CM | POA: Diagnosis not present

## 2016-02-12 DIAGNOSIS — I119 Hypertensive heart disease without heart failure: Secondary | ICD-10-CM | POA: Diagnosis not present

## 2016-02-12 DIAGNOSIS — Z8249 Family history of ischemic heart disease and other diseases of the circulatory system: Secondary | ICD-10-CM | POA: Insufficient documentation

## 2016-02-12 MED ORDER — PERFLUTREN LIPID MICROSPHERE
1.0000 mL | INTRAVENOUS | Status: AC | PRN
  Administered 2016-02-12: 1 mL via INTRAVENOUS

## 2016-02-20 DIAGNOSIS — Z0000 Encounter for general adult medical examination without abnormal findings: Secondary | ICD-10-CM | POA: Diagnosis not present

## 2016-02-20 DIAGNOSIS — Z13220 Encounter for screening for lipoid disorders: Secondary | ICD-10-CM | POA: Diagnosis not present

## 2016-02-29 ENCOUNTER — Ambulatory Visit (INDEPENDENT_AMBULATORY_CARE_PROVIDER_SITE_OTHER): Payer: BLUE CROSS/BLUE SHIELD | Admitting: Podiatry

## 2016-02-29 ENCOUNTER — Encounter: Payer: Self-pay | Admitting: Podiatry

## 2016-02-29 ENCOUNTER — Ambulatory Visit (INDEPENDENT_AMBULATORY_CARE_PROVIDER_SITE_OTHER): Payer: BLUE CROSS/BLUE SHIELD

## 2016-02-29 VITALS — BP 140/80 | HR 67 | Resp 16 | Ht 64.0 in | Wt 152.0 lb

## 2016-02-29 DIAGNOSIS — M79672 Pain in left foot: Secondary | ICD-10-CM

## 2016-02-29 DIAGNOSIS — M79671 Pain in right foot: Secondary | ICD-10-CM

## 2016-02-29 DIAGNOSIS — M779 Enthesopathy, unspecified: Secondary | ICD-10-CM | POA: Diagnosis not present

## 2016-02-29 DIAGNOSIS — M2040 Other hammer toe(s) (acquired), unspecified foot: Secondary | ICD-10-CM

## 2016-02-29 MED ORDER — TRIAMCINOLONE ACETONIDE 10 MG/ML IJ SUSP
10.0000 mg | Freq: Once | INTRAMUSCULAR | Status: AC
  Administered 2016-02-29: 10 mg

## 2016-02-29 NOTE — Progress Notes (Signed)
   Subjective:    Patient ID: Claire Guzman, female    DOB: 1965-09-28, 50 y.o.   MRN: [id number]  HPI Chief Complaint  Patient presents with  . Foot Pain    Left foot; plantar forefoot; x2 months  . Toe Pain    Bilateral; 2nd toes going over 3rd toes      Review of Systems  Musculoskeletal: Positive for arthralgias and gait problem.  All other systems reviewed and are negative.      Objective:   Physical Exam        Assessment & Plan:

## 2016-03-02 NOTE — Progress Notes (Signed)
Subjective:     Patient ID: Claire Guzman, female   DOB: Nov 23, 1965, 50 y.o.   MRN: [id number]  HPI patient has developed pain in the left forefoot and states there is been movement of the toe left over right second digit   Review of Systems  All other systems reviewed and are negative.      Objective:   Physical Exam  Constitutional: She is oriented to person, place, and time.  Cardiovascular: Intact distal pulses.   Musculoskeletal: Normal range of motion.  Neurological: She is oriented to person, place, and time.  Skin: Skin is warm.  Nursing note and vitals reviewed.  neurovascular status intact muscle strength adequate range of motion within normal limits with patient found to have discomfort in the left second metatarsophalangeal joint with fluid buildup and moderate dorsal and medial dislocation of the second digit. The right also has mild dislocation but not to the same degree and there is found to be good digital perfusion and patient is well oriented 3     Assessment:     Inflammatory changes consistent with capsulitis second MPJ left with possibility for flexor plate injury    Plan:     H&P condition reviewed and explained. At this point I wanted to focus on the second MPJ and I did a careful forefoot injection and then after appropriate numbness I aspirated the second MPJ getting out of small amount of clear fluid and injected with a half cc of dexamethasone Kenalog and applied thick plantar padding. May require other treatment depending on response  X-rays of both feet indicates there is slight abnormal appearance to the second digit with slight medial and dorsal dislocation of the digit at the MPJ

## 2016-03-07 ENCOUNTER — Ambulatory Visit: Payer: BLUE CROSS/BLUE SHIELD | Admitting: Podiatry

## 2016-03-14 ENCOUNTER — Encounter: Payer: Self-pay | Admitting: Podiatry

## 2016-03-14 ENCOUNTER — Ambulatory Visit (INDEPENDENT_AMBULATORY_CARE_PROVIDER_SITE_OTHER): Payer: BLUE CROSS/BLUE SHIELD | Admitting: Podiatry

## 2016-03-14 DIAGNOSIS — M779 Enthesopathy, unspecified: Secondary | ICD-10-CM

## 2016-03-14 DIAGNOSIS — M2040 Other hammer toe(s) (acquired), unspecified foot: Secondary | ICD-10-CM | POA: Diagnosis not present

## 2016-03-14 MED ORDER — DICLOFENAC SODIUM 75 MG PO TBEC: 75.0000 mg | DELAYED_RELEASE_TABLET | Freq: Two times a day (BID) | ORAL | 2 refills | Status: DC

## 2016-03-17 NOTE — Progress Notes (Signed)
Subjective:     Patient ID: Claire Guzman, female   DOB: 1965/09/22, 50 y.o.   MRN: [id number]  Patient presents stating that she is improving but still having some discomfort     Review of Systems     Objective:   Physical Exam Neurovascular status intact muscle strength adequate range of motion within normal limits with patient found to have continued discomfort in the offending area with moderate pain when I palpated    Assessment:     Tendinitis still present but improved    Plan:     Advised on physical therapy discussed digital deformity and possibility for correction one point in the future and utilized soft-type shoes to try to keep pressure off. Reappoint as needed

## 2016-04-10 ENCOUNTER — Ambulatory Visit: Payer: BLUE CROSS/BLUE SHIELD | Admitting: Podiatry

## 2016-04-22 DIAGNOSIS — B009 Herpesviral infection, unspecified: Secondary | ICD-10-CM | POA: Diagnosis not present

## 2016-04-22 DIAGNOSIS — R399 Unspecified symptoms and signs involving the genitourinary system: Secondary | ICD-10-CM | POA: Diagnosis not present

## 2016-05-21 DIAGNOSIS — F411 Generalized anxiety disorder: Secondary | ICD-10-CM | POA: Diagnosis not present

## 2016-05-21 DIAGNOSIS — R110 Nausea: Secondary | ICD-10-CM | POA: Diagnosis not present

## 2016-05-21 DIAGNOSIS — R42 Dizziness and giddiness: Secondary | ICD-10-CM | POA: Diagnosis not present

## 2016-05-31 ENCOUNTER — Other Ambulatory Visit: Payer: Self-pay | Admitting: Podiatry

## 2016-06-02 NOTE — Telephone Encounter (Signed)
Pt needs an appt prior to future refills. 

## 2016-07-01 ENCOUNTER — Encounter: Payer: Self-pay | Admitting: Podiatry

## 2016-07-01 ENCOUNTER — Ambulatory Visit (INDEPENDENT_AMBULATORY_CARE_PROVIDER_SITE_OTHER): Payer: BLUE CROSS/BLUE SHIELD | Admitting: Podiatry

## 2016-07-01 ENCOUNTER — Ambulatory Visit (INDEPENDENT_AMBULATORY_CARE_PROVIDER_SITE_OTHER): Payer: BLUE CROSS/BLUE SHIELD

## 2016-07-01 ENCOUNTER — Other Ambulatory Visit: Payer: Self-pay | Admitting: Podiatry

## 2016-07-01 VITALS — BP 146/88 | HR 60 | Resp 14

## 2016-07-01 DIAGNOSIS — R52 Pain, unspecified: Secondary | ICD-10-CM

## 2016-07-01 DIAGNOSIS — M79671 Pain in right foot: Secondary | ICD-10-CM | POA: Diagnosis not present

## 2016-07-01 DIAGNOSIS — S93401A Sprain of unspecified ligament of right ankle, initial encounter: Secondary | ICD-10-CM | POA: Diagnosis not present

## 2016-07-01 NOTE — Progress Notes (Signed)
   Subjective:    Patient ID: Claire Guzman, female    DOB: 27-Sep-1965, 50 y.o.   MRN: [id number]  HPI    This patient presents today describing a one-day history of twisting the right foot and ankle when walking down the steps. She describes catching herself and did not actually fall. She said that the time the injury the foot and ankle was not uncomfortable, however, later on that night she noticed some discomfort around the lateral ankle and lower leg. Patient has existing pain medicine, hydrocodone which she took which did reduce the discomfort. She does recall some several months ago when walking twisting the right ankle, however, the symptoms resolved almost immediately after the injury  Patient has a pending appointment with Dr. Paulla Dolly for pain in the left forefoot in around the second MPJ area. 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      Objective:   Physical Exam  Orientated 3  Vascular: No calf edema or calf tenderness bilaterally No peripheral edema bilaterally DP and PT pulses 2/4 bilaterally Capillary reflex immediate bilaterally  Neurological: Ankle reflex equal and reactive bilaterally Vibratory sensation intact bilaterally Sensation to 10 g monofilament wire intact bilaterally  Dermatological: No open skin lesions bilaterally No ecchymosis, erythema bilaterally  Musculoskeletal: Palpable tenderness anterior talofibular ligament right. No pain or crepitus on range of motion of ankles bilaterally Mild tenderness on inversion of subtalar joint right Manual motor testing: Dorsi flexion, plantar flexion, inversion, eversion 5/5 bilaterally  X-ray examination right ankle dated 07/01/2016  Intact bony structure without fracture and/or dislocation Ankle  mortise appears adequate No increased soft tissue density  Radiographic impression: No acute bony abnormality noted in the weightbearing x-ray of the right ankle dated 07/01/2016       Assessment & Plan:   Assessment: Right lateral ankle sprain  Plan: Reviewed the results of x-ray exam today informed patient that she has a sprain in the lateral right ankle. Dispensed ankle stabilizer to wear and right ankle in athletic style shoe and ongoing daily basis until all symptoms resolve  Follow-up for left forefoot pain with Dr. Paulla Dolly at patient's request

## 2016-07-01 NOTE — Patient Instructions (Signed)
Please wear the ankle stabilizer on your right ankle in an athletic style shoe on ongoing continuous daily basis until all the pain ends. Reduce your standing walking if possible next 1-3 days   Ankle Sprain Introduction An ankle sprain is a stretch or tear in one of the tough tissues (ligaments) in your ankle. Follow these instructions at home:  Rest your ankle.  Take over-the-counter and prescription medicines only as told by your doctor.  For 2-3 days, keep your ankle higher than the level of your heart (elevated) as much as possible.  If directed, put ice on the area:  Put ice in a plastic bag.  Place a towel between your skin and the bag.  Leave the ice on for 20 minutes, 2-3 times a day.  If you were given a brace:  Wear it as told.  Take it off to shower or bathe.  Try not to move your ankle much, but wiggle your toes from time to time. This helps to prevent swelling.  If you were given an elastic bandage (dressing):  Take it off when you shower or bathe.  Try not to move your ankle much, but wiggle your toes from time to time. This helps to prevent swelling.  Adjust the bandage to make it more comfortable if it feels too tight.  Loosen the bandage if you lose feeling in your foot, your foot tingles, or your foot gets cold and blue.  If you have crutches, use them as told by your doctor. Continue to use them until you can walk without feeling pain in your ankle. Contact a doctor if:  Your bruises or swelling are quickly getting worse.  Your pain does not get better after you take medicine. Get help right away if:  You cannot feel your toes or foot.  Your toes or your foot looks blue.  You have very bad pain that gets worse. This information is not intended to replace advice given to you by your health care provider. Make sure you discuss any questions you have with your health care provider. Document Released: 12/31/2007 Document Revised: 12/20/2015  Document Reviewed: 02/13/2015  2017 Elsevier

## 2016-07-02 ENCOUNTER — Ambulatory Visit: Payer: BLUE CROSS/BLUE SHIELD | Admitting: Podiatry

## 2016-07-31 ENCOUNTER — Other Ambulatory Visit: Payer: Self-pay | Admitting: Podiatry

## 2016-08-06 DIAGNOSIS — M5481 Occipital neuralgia: Secondary | ICD-10-CM | POA: Diagnosis not present

## 2016-10-09 ENCOUNTER — Other Ambulatory Visit: Payer: Self-pay | Admitting: Obstetrics and Gynecology

## 2016-10-09 DIAGNOSIS — Z124 Encounter for screening for malignant neoplasm of cervix: Secondary | ICD-10-CM | POA: Diagnosis not present

## 2016-10-09 DIAGNOSIS — Z1231 Encounter for screening mammogram for malignant neoplasm of breast: Secondary | ICD-10-CM | POA: Diagnosis not present

## 2016-10-09 DIAGNOSIS — Z01419 Encounter for gynecological examination (general) (routine) without abnormal findings: Secondary | ICD-10-CM | POA: Diagnosis not present

## 2016-10-09 DIAGNOSIS — Z6826 Body mass index (BMI) 26.0-26.9, adult: Secondary | ICD-10-CM | POA: Diagnosis not present

## 2016-10-09 DIAGNOSIS — Z113 Encounter for screening for infections with a predominantly sexual mode of transmission: Secondary | ICD-10-CM | POA: Diagnosis not present

## 2016-10-11 LAB — CYTOLOGY - PAP

## 2016-11-26 DIAGNOSIS — R350 Frequency of micturition: Secondary | ICD-10-CM | POA: Diagnosis not present

## 2016-11-26 DIAGNOSIS — N951 Menopausal and female climacteric states: Secondary | ICD-10-CM | POA: Diagnosis not present

## 2016-11-26 DIAGNOSIS — R5382 Chronic fatigue, unspecified: Secondary | ICD-10-CM | POA: Diagnosis not present

## 2017-03-25 DIAGNOSIS — N926 Irregular menstruation, unspecified: Secondary | ICD-10-CM | POA: Diagnosis not present

## 2017-03-26 DIAGNOSIS — R102 Pelvic and perineal pain: Secondary | ICD-10-CM | POA: Diagnosis not present

## 2017-03-26 DIAGNOSIS — Z6826 Body mass index (BMI) 26.0-26.9, adult: Secondary | ICD-10-CM | POA: Diagnosis not present

## 2017-04-11 DIAGNOSIS — S9032XA Contusion of left foot, initial encounter: Secondary | ICD-10-CM | POA: Diagnosis not present

## 2017-05-21 DIAGNOSIS — N840 Polyp of corpus uteri: Secondary | ICD-10-CM | POA: Diagnosis not present

## 2017-06-17 DIAGNOSIS — J069 Acute upper respiratory infection, unspecified: Secondary | ICD-10-CM | POA: Diagnosis not present

## 2017-06-17 DIAGNOSIS — J029 Acute pharyngitis, unspecified: Secondary | ICD-10-CM | POA: Diagnosis not present

## 2017-06-24 DIAGNOSIS — I10 Essential (primary) hypertension: Secondary | ICD-10-CM | POA: Diagnosis not present

## 2017-06-24 DIAGNOSIS — R05 Cough: Secondary | ICD-10-CM | POA: Diagnosis not present

## 2017-06-24 DIAGNOSIS — F321 Major depressive disorder, single episode, moderate: Secondary | ICD-10-CM | POA: Diagnosis not present

## 2017-07-16 ENCOUNTER — Ambulatory Visit
Admission: RE | Admit: 2017-07-16 | Discharge: 2017-07-16 | Disposition: A | Discharge status: Home / Self Care | Payer: BLUE CROSS/BLUE SHIELD | Source: Ambulatory Visit | Attending: Family Medicine | Admitting: Family Medicine

## 2017-07-16 ENCOUNTER — Other Ambulatory Visit: Payer: Self-pay | Admitting: Family Medicine

## 2017-07-16 DIAGNOSIS — R05 Cough: Secondary | ICD-10-CM

## 2017-07-16 DIAGNOSIS — R059 Cough, unspecified: Secondary | ICD-10-CM

## 2017-07-27 DIAGNOSIS — J392 Other diseases of pharynx: Secondary | ICD-10-CM | POA: Diagnosis not present

## 2017-07-27 DIAGNOSIS — R05 Cough: Secondary | ICD-10-CM | POA: Diagnosis not present

## 2017-07-27 DIAGNOSIS — F458 Other somatoform disorders: Secondary | ICD-10-CM | POA: Diagnosis not present

## 2017-07-27 DIAGNOSIS — K219 Gastro-esophageal reflux disease without esophagitis: Secondary | ICD-10-CM | POA: Diagnosis not present

## 2017-08-13 DIAGNOSIS — K219 Gastro-esophageal reflux disease without esophagitis: Secondary | ICD-10-CM | POA: Diagnosis not present

## 2017-08-13 DIAGNOSIS — J029 Acute pharyngitis, unspecified: Secondary | ICD-10-CM | POA: Diagnosis not present

## 2017-10-09 DIAGNOSIS — M65342 Trigger finger, left ring finger: Secondary | ICD-10-CM | POA: Diagnosis not present

## 2018-01-20 DIAGNOSIS — I10 Essential (primary) hypertension: Secondary | ICD-10-CM | POA: Diagnosis not present

## 2018-01-20 DIAGNOSIS — Z0000 Encounter for general adult medical examination without abnormal findings: Secondary | ICD-10-CM | POA: Diagnosis not present

## 2018-01-20 DIAGNOSIS — F411 Generalized anxiety disorder: Secondary | ICD-10-CM | POA: Diagnosis not present

## 2018-02-24 DIAGNOSIS — Z1231 Encounter for screening mammogram for malignant neoplasm of breast: Secondary | ICD-10-CM | POA: Diagnosis not present

## 2018-02-24 DIAGNOSIS — Z124 Encounter for screening for malignant neoplasm of cervix: Secondary | ICD-10-CM | POA: Diagnosis not present

## 2018-02-24 DIAGNOSIS — Z6828 Body mass index (BMI) 28.0-28.9, adult: Secondary | ICD-10-CM | POA: Diagnosis not present

## 2018-02-24 DIAGNOSIS — Z01419 Encounter for gynecological examination (general) (routine) without abnormal findings: Secondary | ICD-10-CM | POA: Diagnosis not present

## 2018-02-26 DIAGNOSIS — E876 Hypokalemia: Secondary | ICD-10-CM | POA: Diagnosis not present

## 2018-02-26 DIAGNOSIS — R42 Dizziness and giddiness: Secondary | ICD-10-CM | POA: Diagnosis not present

## 2018-03-02 ENCOUNTER — Other Ambulatory Visit: Payer: Self-pay | Admitting: Obstetrics and Gynecology

## 2018-03-02 DIAGNOSIS — N6310 Unspecified lump in the right breast, unspecified quadrant: Secondary | ICD-10-CM

## 2018-03-04 ENCOUNTER — Other Ambulatory Visit: Payer: Self-pay | Admitting: Obstetrics and Gynecology

## 2018-03-04 ENCOUNTER — Ambulatory Visit
Admission: RE | Admit: 2018-03-04 | Discharge: 2018-03-04 | Disposition: A | Discharge status: Home / Self Care | Payer: BLUE CROSS/BLUE SHIELD | Source: Ambulatory Visit | Attending: Obstetrics and Gynecology | Admitting: Obstetrics and Gynecology

## 2018-03-04 DIAGNOSIS — N6310 Unspecified lump in the right breast, unspecified quadrant: Secondary | ICD-10-CM

## 2018-03-04 DIAGNOSIS — N6312 Unspecified lump in the right breast, upper inner quadrant: Secondary | ICD-10-CM | POA: Diagnosis not present

## 2018-03-04 DIAGNOSIS — R922 Inconclusive mammogram: Secondary | ICD-10-CM | POA: Diagnosis not present

## 2018-03-04 DIAGNOSIS — R599 Enlarged lymph nodes, unspecified: Secondary | ICD-10-CM

## 2018-03-08 ENCOUNTER — Ambulatory Visit
Admission: RE | Admit: 2018-03-08 | Discharge: 2018-03-08 | Disposition: A | Discharge status: Home / Self Care | Payer: BLUE CROSS/BLUE SHIELD | Source: Ambulatory Visit | Attending: Obstetrics and Gynecology | Admitting: Obstetrics and Gynecology

## 2018-03-08 DIAGNOSIS — R599 Enlarged lymph nodes, unspecified: Secondary | ICD-10-CM

## 2018-03-08 DIAGNOSIS — C50211 Malignant neoplasm of upper-inner quadrant of right female breast: Secondary | ICD-10-CM | POA: Diagnosis not present

## 2018-03-08 DIAGNOSIS — N6310 Unspecified lump in the right breast, unspecified quadrant: Secondary | ICD-10-CM

## 2018-03-08 DIAGNOSIS — N6312 Unspecified lump in the right breast, upper inner quadrant: Secondary | ICD-10-CM | POA: Diagnosis not present

## 2018-03-08 DIAGNOSIS — R590 Localized enlarged lymph nodes: Secondary | ICD-10-CM | POA: Diagnosis not present

## 2018-03-08 DIAGNOSIS — C773 Secondary and unspecified malignant neoplasm of axilla and upper limb lymph nodes: Secondary | ICD-10-CM | POA: Diagnosis not present

## 2018-03-09 ENCOUNTER — Telehealth: Payer: Self-pay | Admitting: Hematology

## 2018-03-09 NOTE — Telephone Encounter (Signed)
Patient returned call to confirm afternoon [facility] appointment for 8/21, packet will be emailed to patient

## 2018-03-10 ENCOUNTER — Encounter: Payer: Self-pay | Admitting: *Deleted

## 2018-03-10 DIAGNOSIS — Z170 Estrogen receptor positive status [ER+]: Secondary | ICD-10-CM | POA: Insufficient documentation

## 2018-03-10 DIAGNOSIS — C50211 Malignant neoplasm of upper-inner quadrant of right female breast: Secondary | ICD-10-CM

## 2018-03-14 ENCOUNTER — Encounter: Payer: Self-pay | Admitting: General Surgery

## 2018-03-15 NOTE — Progress Notes (Addendum)
[place]   [phone] Fax:([phone]) [place] Note   Patient Care Team: Lujean Amel, MD as PCP - General (Family Medicine) Fanny Skates, MD as Consulting Physician (General Surgery) Truitt Merle, MD as Consulting Physician (Hematology) Eppie Gibson, MD as Attending Physician (Radiation Oncology) 03/17/2018  CHIEF COMPLAINTS/PURPOSE OF CONSULTATION:  Newly diagnosed stage IB breast cancer  Oncology History   Cancer Staging Malignant neoplasm of upper-inner quadrant of right breast in female, estrogen receptor positive ([place]) Staging form: Breast, AJCC 8th Edition - Clinical stage from 03/08/2018: Stage IB (cT2, cN1, cM0, G3, ER+, PR+, HER2+) - Signed by Truitt Merle, MD on 03/16/2018       Malignant neoplasm of upper-inner quadrant of right breast in female, estrogen receptor positive ([place])   02/24/2018 Mammogram    screening mammogram on 02/24/2018 that was benign    03/04/2018 Mammogram    diagnostic mammogram on 03/04/2018 due to a palpable mass on her right breast. Diagnostic mammogram, breast US revealed and biopsy revealed suspicious mass in the right breast at 2:30 at the palpable site of concern and one suspicious right axillary LN.    03/08/2018 Cancer Staging    Staging form: Breast, AJCC 8th Edition - Clinical stage from 03/08/2018: Stage IB (cT2, cN1, cM0, G3, ER+, PR+, HER2+) - Signed by Truitt Merle, MD on 03/16/2018    03/08/2018 Receptors her2    Her2 positive, ER 90% PR 5% and Ki67 60%    03/08/2018 Pathology Results    Diagnosis 1. Breast, right, needle core biopsy, 2:30 - INVASIVE DUCTAL CARCINOMA, GRADE 3. - LYMPHOVASCULAR SPACE INVOLVEMENT BY TUMOR. 2. Lymph node, needle/core biopsy, right axilla - METASTATIC CARCINOMA INVOLVING SCANT LYMPHOID TISSUE.    03/10/2018 Initial Diagnosis    Malignant neoplasm of upper-inner quadrant of right breast in female, estrogen receptor positive ([place])     HISTORY OF PRESENTING  ILLNESS:  Claire Guzman 52 y.o. female is here because of newly diagnosed stage IB breast cancer. She had a screening mammogram on 02/24/2018 that was benign. She later had a diagnostic mammogram on 03/04/2018 due to a palpable mass on her right breast. Diagnostic mammogram, breast US revealed and biopsy revealed suspicious mass in the right breast at 2:30 at the palpable site of concern and one suspicious right axillary LN. Biopsy revealed invasive breast cancer with metastasis to one LN.  Today, she is here with her family members. She is feeling well. She complains of smelling smoke and ammonia at home. She denies having sinus problems. She also complains of dizziness that's been there for some time. She denies headaches, or change in vision. She has tinnitus, that she thinks is getting worse.  She denies new breast symptoms. She denies pain or weight changes. No abdmonial pain or discomfort.  She has HTN for which she takes medications. She takes Effexor for depression and anxiety.   Grandfather had colon cancer. Father had prostate. Mother had melanoma. Uncles had colon cancer.  She works in Press [employer].    GYN HISTORY  Menarchal: 15 LMP: 02/15/2018, irregular Contraceptive: She is on Levonorgestrel-ethinyl estradiol. HRT: None GP: 1 child, age 70 at first delivery    MEDICAL HISTORY:  Past Medical History:  Diagnosis Date  . Abdominal pain, unspecified site   . Anxiety state, unspecified   . Contact dermatitis and other eczema, due to unspecified cause   . Essential hypertension, benign   . Hematuria, unspecified   . Hypertension   . Mixed  hyperlipidemia   . Rosacea   . Unspecified symptom associated with female genital organs     SURGICAL HISTORY: Past Surgical History:  Procedure Laterality Date  . CESAREAN SECTION    . LAPAROSCOPIC APPENDECTOMY      SOCIAL HISTORY: Social History   Socioeconomic History  . Marital status: Married    Spouse name: Not on file  .  Number of children: Not on file  . Years of education: Not on file  . Highest education level: Not on file  Occupational History  . Not on file  Social Needs  . Financial resource strain: Not on file  . Food insecurity:    Worry: Not on file    Inability: Not on file  . Transportation needs:    Medical: Not on file    Non-medical: Not on file  Tobacco Use  . Smoking status: Never Smoker  . Smokeless tobacco: Never Used  Substance and Sexual Activity  . Alcohol use: No  . Drug use: Never  . Sexual activity: Not on file  Lifestyle  . Physical activity:    Days per week: Not on file    Minutes per session: Not on file  . Stress: Not on file  Relationships  . Social connections:    Talks on phone: Not on file    Gets together: Not on file    Attends religious service: Not on file    Active member of club or organization: Not on file    Attends meetings of clubs or organizations: Not on file    Relationship status: Not on file  . Intimate partner violence:    Fear of current or ex partner: Not on file    Emotionally abused: Not on file    Physically abused: Not on file    Forced sexual activity: Not on file  Other Topics Concern  . Not on file  Social History Narrative  . Not on file    FAMILY HISTORY: Family History  Problem Relation Age of Onset  . Heart failure Mother   . Melanoma Mother   . Prostate cancer Father 70  . Heart attack Maternal Grandmother   . Heart attack Maternal Grandfather   . Colon cancer Maternal Grandfather   . Stroke Paternal Grandmother   . Cancer Paternal Grandfather   . Hypertension Unknown   . Colon cancer Other   . Colon cancer Other     ALLERGIES:  is allergic to codeine; metrogel [metronidazole]; other; paxil [paroxetine hcl]; prednisone; tramadol; and zomig [zolmitriptan].  MEDICATIONS:  Current Outpatient Medications  Medication Sig Dispense Refill  . ALPRAZolam (XANAX) 0.25 MG tablet Take 0.25 mg by mouth at bedtime as  needed for sleep or anxiety.    Marland Kitchen atenolol (TENORMIN) 50 MG tablet Take 50 mg by mouth at bedtime.    . diclofenac (VOLTAREN) 75 MG EC tablet TAKE 1 TABLET(75 MG) BY MOUTH TWICE DAILY 50 tablet 0  . levonorgestrel-ethinyl estradiol (VIENVA) 0.1-20 MG-MCG tablet Take 1 tablet by mouth daily.     No current facility-administered medications for this visit.     REVIEW OF SYSTEMS:   Constitutional: Denies fevers, chills or abnormal night sweats Eyes: Denies blurriness of vision, double vision or watery eyes Ears, nose, mouth, throat, and face: Denies mucositis or sore throat Respiratory: Denies cough, dyspnea or wheezes Cardiovascular: Denies palpitation, chest discomfort or lower extremity swelling Gastrointestinal:  Denies nausea, heartburn or change in bowel habits Skin: Denies abnormal skin rashes Lymphatics: Denies new  lymphadenopathy or easy bruising Neurological:Denies numbness, tingling or new weaknesses Breast: (+) palpable mass in her right breast Behavioral/Psych: Mood is stable, no new changes  All other systems were reviewed with the patient and are negative.  PHYSICAL EXAMINATION:  ECOG PERFORMANCE STATUS: 0 - Asymptomatic  Vitals:   03/17/18 1230  BP: 123/62  Pulse: 63  Resp: 18  Temp: 98.4 F (36.9 C)  SpO2: 99%   Filed Weights   03/17/18 1230  Weight: 163 lb 6.4 oz (74.1 kg)    GENERAL:alert, no distress and comfortable SKIN: skin color, texture, turgor are normal, no rashes or significant lesions EYES: normal, conjunctiva are pink and non-injected, sclera clear OROPHARYNX:no exudate, no erythema and lips, buccal mucosa, and tongue normal  NECK: supple, thyroid normal size, non-tender, without nodularity LYMPH:  no palpable lymphadenopathy in the cervical or inguinal (+) 1.5 cm right axillary LN LUNGS: clear to auscultation and percussion with normal breathing effort HEART: regular rate & rhythm and no murmurs and no lower extremity edema ABDOMEN:abdomen  soft, non-tender and normal bowel sounds Musculoskeletal:no cyanosis of digits and no clubbing  Breast: (+) biopsy site healing well. 2.5cm x 5cm in 3 o'clock position lump on her right breast. No nipple retraction or discharge. No skin changes. PSYCH: alert & oriented x 3 with fluent speech NEURO: no focal motor/sensory deficits  LABORATORY DATA:  I have reviewed the data as listed CBC Latest Ref Rng & Units 03/17/2018 03/16/2013 03/16/2013  WBC 3.9 - 10.3 K/uL 6.3 - 9.0  Hemoglobin 11.6 - 15.9 g/dL 13.6 13.3 13.7  Hematocrit 34.8 - 46.6 % 39.5 39.0 37.9  Platelets 145 - 400 K/uL 226 - 212    CMP Latest Ref Rng & Units 03/17/2018 03/16/2013 05/20/2011  Glucose 70 - 99 mg/dL 95 105(H) 97  BUN 6 - 20 mg/dL _0 Creatinine 0.44 - 1.00 mg/dL 0.70 0.70 0.54  Sodium 135 - 145 mmol/L 142 141 139  Potassium 3.5 - 5.1 mmol/L 3.3(L) 3.3(L) 3.3(L)  Chloride 98 - 111 mmol/L 105 105 104  CO2 22 - 32 mmol/L 29 - 26  Calcium 8.9 - 10.3 mg/dL 9.0 - 9.5  Total Protein 6.5 - 8.1 g/dL 7.3 - -  Total Bilirubin 0.3 - 1.2 mg/dL 0.5 - -  Alkaline Phos 38 - 126 U/L 84 - -  AST 15 - 41 U/L 17 - -  ALT 0 - 44 U/L 11 - -    PATHOLOGY:   03/08/2018 Surgical Pathology ADDITIONAL INFORMATION: 1. PROGNOSTIC INDICATORS Results: IMMUNOHISTOCHEMICAL AND MORPHOMETRIC ANALYSIS PERFORMED MANUALLY The tumor cells are positive for Her2 (3+). Estrogen Receptor: 90%, POSITIVE, STRONG STAINING INTENSITY Progesterone Receptor: 5%, POSITIVE, STRONG STAINING INTENSITY Proliferation Marker Ki67: 60% REFERENCE RANGE ESTROGEN RECEPTOR NEGATIVE 0% POSITIVE =>1% REFERENCE RANGE PROGESTERONE RECEPTOR NEGATIVE 0% POSITIVE =>1% All controls stained appropriately Diagnosis 1. Breast, right, needle core biopsy, 2:30 - INVASIVE DUCTAL CARCINOMA, GRADE 3. - LYMPHOVASCULAR SPACE INVOLVEMENT BY TUMOR. 2. Lymph node, needle/core biopsy, right axilla - METASTATIC CARCINOMA INVOLVING SCANT LYMPHOID TISSUE. Synoptic  Report 1. Breast prognostic profile will be performed. Dr. Vic Ripper agrees. Called to [facility] on 03/09/18. (JDP:gt, 03/09/18) Specimen Comment 1. Formalin 1:30 PM; right breast mass 2. Axillary adenopathy Specimen(s) Obtained: 1. Breast, right, needle core biopsy, 2:30 2. Lymph node, needle/core biopsy, right axilla Specimen Clinical Information 1. R/O IMC 2. R/O axillary metastasis Gross 1. Received in formalin (time in formalin 1:30 p.m., cold ischemic time unknown) are two cores of  soft tan yellow tissue measuring 1.3 and 1.8 cm in length and each 0.2 cm in diameter. The specimen is submitted in toto. 2. Received in formalin (time in formalin 1:30 p.m., cold ischemic time unknown) is a 1.3 x 0.2 cm core of soft hemorrhagic tissue and a separate 0.2 cm soft tan yellow tissue fragment. The specimen is submitted in toto. (GP:gt, 03/09/18) Stain(s) used in Diagnosis: The following stain(s) were used in diagnosing the case: ER-ACIS, PR-ACIS, Her2 by IHC, KI-67-ACIS. The control(s) stained appropriately.   RADIOGRAPHIC STUDIES: I have personally reviewed the radiological images as listed and agreed with the findings in the report.   03/04/2018 Diagnostic Mammogram and Breast US IMPRESSION: 1. There is a suspicious mass in the right breast at 2:30 at the palpable site of concern.  2.  There is 1 suspicious lymph node in the right axilla.  02/24/2018 Screening Mammogram   ASSESSMENT & PLAN:  Claire Guzman is a 51 y.o. lovely female with a history of   1.Malignant neoplasm of upper-inner quadrant of right breast in female, invasive ductal carcinoma, cT2N1M0 stage IB, ER+/PR+/HER2+, G3  -She presented with a palpable right breast mass on exam.  She had had a screening mammogram on 02/24/2018 that was negative Breast US revealed a and biopsy revealed a 2.1 cm mass right breast, and one suspicious right axillary LN. -Biopsy revealed invasive breast cancer with  metastasis to one LN. -Immunohistochemistry revealed that the tumor is Her2 positive, ER 90% PR 5% and Ki67 60%. -We discussed the risk of cancer recurrence after completed surgical resection.  Due to her positive lymph nodes, HER-2 positive disease, her risk of recurrence is high.  I recommend neoadjuvant or adjuvant chemotherapy with TCHP (docetaxel, carboplatin, Herceptin and pejeta every 3 weeks for total of 6 weeks, followed by maintenance Herceptin and Perjeta or adjuvant TDM1). -We discussed the benefits of neoadjuvant chemotherapy, which can potentially downstage her breast tumor and lymph nodes, she may be a candidate for targeted lymph node dissection after neoadjuvant chemotherapy.  We are able to tell if she is responding to chemo or not if we do it before surgery, and if she has significant residual disease after neoadjuvant chemotherapy, she will be a candidate for adjuvant TDM 1.  -Dr. Dalbert Batman and I all feel neoadjuvant is a better approach for her disease, and patient is agreeable. --Chemotherapy consent: Side effects including but does not not limited to, fatigue, nausea, vomiting, diarrhea, hair loss, neuropathy, fluid retention, renal and kidney dysfunction, neutropenic fever, needed for blood transfusion, bleeding, reversible congestive heart failure, were discussed with patient in great detail. She agrees to proceed. -The goal of chemotherapy is curative. -She was seen by radiation oncologist Dr. Isidore Moos today, she will need adjuvant breast and axillary radiation. -I recommend MRI before and after chemotherapy to evaluate her response to chemotherapy. -Due to her node positive disease, I recommend CT chest, abdomen and pelvis with contrast, and a bone scan for staging. -Due to her abnormal smelling lately, I recommend a brain MRI with and without contrast to rule out brain metastasis. -She will start neoadjuvant chemo TCHP every 3 weeks in 2 weeks.  -port placement by Dr. Dalbert Batman in the  next 2 weeks -I will schedule for chemo class in 1-2 weeks. -Labs, flush, f/u and chemo in week of 9/4 -CT CAP W contrast, bone scan and brain MRI in 1-2 weeks -She is on birth control pill, I recommend her stop.  Contraceptive options were discussed with patient  and her husband, I recommend him to use condom -The risk of infertility from chemotherapy were discussed with patient, she does not plan to have more children.  2.Genetics  -She has no family history of breast cancer, due to her relatively young age of breast cancer, she is a candidate for genetic testing.  She is interested, will refer her.  3. Anxiety  -She has history of anxiety, currently on Xanax as needed  4. HTN -She will continue atenolol.  We we will monitor her blood pressure closely during the chemotherapy, and adjust her medication as needed.  Plan: -MRI before and after neoadjuvant chemotherapy with TCHP  -CT CAP w contrast, bone scan and brain MRI in 1-2 weeks. -Echo in 1-2 weeks -Lab, flush, f/u and chemo in  With TCHP week of 9/4 -Genetics referral -I will call in Zofran, Compazine, EMLA cream, and dexamethasone for her.  She had skin rash and itchiness from prednisone in the past, I will reduce her dexamethasone to 4 mg twice daily, the day before, and daily to for 2 days after chemotherapy.  All questions were answered. The patient knows to call the clinic with any problems, questions or concerns. I spent 55 minutes counseling the patient face to face. The total time spent in the appointment was 70 minutes and more than 50% was on counseling.     Truitt Merle, MD 03/17/2018 5:48 PM  I, Carmon Sails am acting as scribe for Dr. Truitt Merle.  I have reviewed the above documentation for accuracy and completeness, and I agree with the above.

## 2018-03-16 ENCOUNTER — Encounter: Payer: Self-pay | Admitting: Genetics

## 2018-03-17 ENCOUNTER — Other Ambulatory Visit: Payer: Self-pay | Admitting: Hematology

## 2018-03-17 ENCOUNTER — Other Ambulatory Visit: Payer: Self-pay | Admitting: General Surgery

## 2018-03-17 ENCOUNTER — Ambulatory Visit: Payer: BLUE CROSS/BLUE SHIELD | Admitting: Physical Therapy

## 2018-03-17 ENCOUNTER — Ambulatory Visit
Admission: RE | Admit: 2018-03-17 | Discharge: 2018-03-17 | Disposition: A | Discharge status: Home / Self Care | Payer: BLUE CROSS/BLUE SHIELD | Source: Ambulatory Visit | Attending: Radiation Oncology | Admitting: Radiation Oncology

## 2018-03-17 ENCOUNTER — Other Ambulatory Visit: Payer: Self-pay | Admitting: *Deleted

## 2018-03-17 ENCOUNTER — Other Ambulatory Visit: Payer: Self-pay

## 2018-03-17 ENCOUNTER — Inpatient Hospital Stay: Payer: BLUE CROSS/BLUE SHIELD

## 2018-03-17 ENCOUNTER — Encounter: Payer: Self-pay | Admitting: Physical Therapy

## 2018-03-17 ENCOUNTER — Ambulatory Visit: Payer: BLUE CROSS/BLUE SHIELD | Attending: General Surgery | Admitting: Physical Therapy

## 2018-03-17 ENCOUNTER — Inpatient Hospital Stay: Payer: BLUE CROSS/BLUE SHIELD | Attending: Hematology | Admitting: Hematology

## 2018-03-17 ENCOUNTER — Encounter: Payer: Self-pay | Admitting: Hematology

## 2018-03-17 ENCOUNTER — Encounter: Payer: Self-pay | Admitting: Radiation Oncology

## 2018-03-17 VITALS — BP 123/62 | HR 63 | Temp 98.4°F | Resp 18 | Ht 64.0 in | Wt 163.4 lb

## 2018-03-17 DIAGNOSIS — C773 Secondary and unspecified malignant neoplasm of axilla and upper limb lymph nodes: Secondary | ICD-10-CM

## 2018-03-17 DIAGNOSIS — Z800 Family history of malignant neoplasm of digestive organs: Secondary | ICD-10-CM | POA: Diagnosis not present

## 2018-03-17 DIAGNOSIS — I10 Essential (primary) hypertension: Secondary | ICD-10-CM | POA: Diagnosis not present

## 2018-03-17 DIAGNOSIS — Z170 Estrogen receptor positive status [ER+]: Secondary | ICD-10-CM | POA: Insufficient documentation

## 2018-03-17 DIAGNOSIS — C50211 Malignant neoplasm of upper-inner quadrant of right female breast: Secondary | ICD-10-CM

## 2018-03-17 DIAGNOSIS — F458 Other somatoform disorders: Secondary | ICD-10-CM | POA: Diagnosis not present

## 2018-03-17 DIAGNOSIS — Z808 Family history of malignant neoplasm of other organs or systems: Secondary | ICD-10-CM | POA: Diagnosis not present

## 2018-03-17 DIAGNOSIS — Z793 Long term (current) use of hormonal contraceptives: Secondary | ICD-10-CM | POA: Diagnosis not present

## 2018-03-17 DIAGNOSIS — R293 Abnormal posture: Secondary | ICD-10-CM | POA: Diagnosis not present

## 2018-03-17 DIAGNOSIS — F329 Major depressive disorder, single episode, unspecified: Secondary | ICD-10-CM | POA: Diagnosis not present

## 2018-03-17 DIAGNOSIS — Z9049 Acquired absence of other specified parts of digestive tract: Secondary | ICD-10-CM | POA: Diagnosis not present

## 2018-03-17 DIAGNOSIS — Z8042 Family history of malignant neoplasm of prostate: Secondary | ICD-10-CM | POA: Diagnosis not present

## 2018-03-17 LAB — CMP (CANCER CENTER ONLY)
ALBUMIN: 3.9 g/dL (ref 3.5–5.0)
ALK PHOS: 84 U/L (ref 38–126)
ALT: 11 U/L (ref 0–44)
AST: 17 U/L (ref 15–41)
Anion gap: 8 (ref 5–15)
BILIRUBIN TOTAL: 0.5 mg/dL (ref 0.3–1.2)
BUN: 10 mg/dL (ref 6–20)
CO2: 29 mmol/L (ref 22–32)
Calcium: 9 mg/dL (ref 8.9–10.3)
Chloride: 105 mmol/L (ref 98–111)
Creatinine: 0.7 mg/dL (ref 0.44–1.00)
GFR, Est AFR Am: >60 mL/min (ref >60)
GFR, Estimated: >60 mL/min (ref >60)
GLUCOSE: 95 mg/dL (ref 70–99)
Potassium: 3.3 mmol/L — ABNORMAL LOW (ref 3.5–5.1)
SODIUM: 142 mmol/L (ref 135–145)
TOTAL PROTEIN: 7.3 g/dL (ref 6.5–8.1)

## 2018-03-17 LAB — CBC WITH DIFFERENTIAL (CANCER CENTER ONLY)
BASOS ABS: 0 10*3/uL (ref 0.0–0.1)
BASOS PCT: 1 %
Eosinophils Absolute: 0.1 10*3/uL (ref 0.0–0.5)
Eosinophils Relative: 2 %
HEMATOCRIT: 39.5 % (ref 34.8–46.6)
HEMOGLOBIN: 13.6 g/dL (ref 11.6–15.9)
Lymphocytes Relative: 28 %
Lymphs Abs: 1.8 10*3/uL (ref 0.9–3.3)
MCH: 31.6 pg (ref 25.1–34.0)
MCHC: 34.3 g/dL (ref 31.5–36.0)
MCV: 91.9 fL (ref 79.5–101.0)
Monocytes Absolute: 0.5 10*3/uL (ref 0.1–0.9)
Monocytes Relative: 8 %
NEUTROS ABS: 3.9 10*3/uL (ref 1.5–6.5)
NEUTROS PCT: 61 %
Platelet Count: 226 10*3/uL (ref 145–400)
RBC: 4.3 MIL/uL (ref 3.70–5.45)
RDW: 12.4 % (ref 11.2–14.5)
WBC Count: 6.3 10*3/uL (ref 3.9–10.3)

## 2018-03-17 MED ORDER — LIDOCAINE-PRILOCAINE 2.5-2.5 % EX CREA: TOPICAL_CREAM | CUTANEOUS | 3 refills | Status: DC

## 2018-03-17 MED ORDER — PROCHLORPERAZINE MALEATE 10 MG PO TABS: 10.0000 mg | ORAL_TABLET | Freq: Four times a day (QID) | ORAL | 1 refills | Status: DC | PRN

## 2018-03-17 MED ORDER — DEXAMETHASONE 4 MG PO TABS: 4.0000 mg | ORAL_TABLET | Freq: Two times a day (BID) | ORAL | 1 refills | Status: DC

## 2018-03-17 MED ORDER — ONDANSETRON HCL 8 MG PO TABS: 8.0000 mg | ORAL_TABLET | Freq: Two times a day (BID) | ORAL | 1 refills | Status: DC | PRN

## 2018-03-17 NOTE — Therapy (Signed)
[place], [place], [address] Phone: [phone]   Fax:  [phone]  Physical Therapy Evaluation  Patient Details  Name: Claire Guzman MRN: [id number] Date of Birth: 1966-02-25 Referring Provider: Dr. Fanny Skates   Encounter Date: 03/17/2018  PT End of Session - 03/17/18 1448    Visit Number  1    Number of Visits  1    PT Start Time  1350    PT Stop Time  1425    PT Time Calculation (min)  35 min    Activity Tolerance  Patient tolerated treatment well    Behavior During Therapy  [facility] for tasks assessed/performed       Past Medical History:  Diagnosis Date  . Abdominal pain, unspecified site   . Anxiety state, unspecified   . Contact dermatitis and other eczema, due to unspecified cause   . Essential hypertension, benign   . Hematuria, unspecified   . Hypertension   . Mixed hyperlipidemia   . Rosacea   . Unspecified symptom associated with female genital organs     Past Surgical History:  Procedure Laterality Date  . CESAREAN SECTION    . LAPAROSCOPIC APPENDECTOMY      There were no vitals filed for this visit.   Subjective Assessment - 03/17/18 1442    Subjective  Patient reports she is here today to be seen by her medical team for her newly diagnosed right breast cancer.    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 It measures 2.1 cm and is located in the upper inner quadrant with a Ki67 of 60%. She has a known positive axillary node.    Patient Stated Goals  Reduce lymphedema risk and learn post op shoulder ROM HEP    Currently in Pain?  No/denies         [facility] PT Assessment - 03/17/18 0001      Assessment   Medical Diagnosis  Right breast cancer    Referring Provider  Dr. Fanny Skates    Onset Date/Surgical Date  03/04/18    Hand  Dominance  Right    Prior Therapy  none      Precautions   Precautions  Other (comment)    Precaution Comments  active cancer      Restrictions   Weight Bearing Restrictions  No      Balance Screen   Has the patient fallen in the past 6 months  No    Has the patient had a decrease in activity level because of a fear of falling?   No    Is the patient reluctant to leave their home because of a fear of falling?   No      Home Environment   Living Environment  Private residence    Living Arrangements  Spouse/significant other;Children   Husband and 93 y.o. daughter   Available Help at Discharge  Family      Prior Function   Level of Independence  Independent    Vocation  Full time employment    Set designer at [employer]; lifts, carries heavy coats    Leisure  She walks her dogs several times per day      Cognition   Overall Cognitive Status  Within Functional Limits for tasks assessed      Posture/Postural Control  Posture/Postural Control  Postural limitations    Postural Limitations  Rounded Shoulders;Forward head      ROM / Strength   AROM / PROM / Strength  AROM;Strength      AROM   AROM Assessment Site  Shoulder;Cervical    Right/Left Shoulder  Right;Left    Right Shoulder Extension  50 Degrees    Right Shoulder Flexion  146 Degrees    Right Shoulder ABduction  155 Degrees    Right Shoulder Internal Rotation  51 Degrees    Right Shoulder External Rotation  86 Degrees    Left Shoulder Extension  48 Degrees    Left Shoulder Flexion  135 Degrees    Left Shoulder ABduction  147 Degrees    Left Shoulder Internal Rotation  50 Degrees    Left Shoulder External Rotation  90 Degrees    Cervical Flexion  WNL    Cervical Extension  25% limited    Cervical - Right Side Bend  WNL    Cervical - Left Side Bend  WNL    Cervical - Right Rotation  WNL    Cervical - Left Rotation  WNL      Strength   Overall Strength  Within functional limits for tasks  performed        LYMPHEDEMA/ONCOLOGY QUESTIONNAIRE - 03/17/18 1446      Type   Cancer Type  Right breast cancer      Lymphedema Assessments   Lymphedema Assessments  Upper extremities      Right Upper Extremity Lymphedema   10 cm Proximal to Olecranon Process  29.8 cm    Olecranon Process  26.1 cm    10 cm Proximal to Ulnar Styloid Process  25.2 cm    Just Proximal to Ulnar Styloid Process  18.5 cm    Across Hand at [employer]  19.2 cm    At [place] of 2nd Digit  6.9 cm      Left Upper Extremity Lymphedema   10 cm Proximal to Olecranon Process  29.6 cm    Olecranon Process  26.3 cm    10 cm Proximal to Ulnar Styloid Process  24.5 cm    Just Proximal to Ulnar Styloid Process  18.3 cm    Across Hand at [employer]  18.6 cm    At [place] of 2nd Digit  6.8 cm             Objective measurements completed on examination: See above findings.         Patient was instructed today in a home exercise program today for post op shoulder range of motion. These included active assist shoulder flexion in sitting, scapular retraction, wall walking with shoulder abduction, and hands behind head external rotation.  She was encouraged to do these twice a day, holding 3 seconds and repeating 5 times when permitted by her physician.         PT Education - 03/17/18 1448    Education Details  Lymphedema risk reduction and post op shoulder ROM HEP    Person(s) Educated  Patient;Spouse;Parent(s)    Methods  Demonstration;Handout    Comprehension  Returned demonstration;Verbalized understanding           Breast Clinic Goals - 03/17/18 1454      Patient will be able to verbalize understanding of pertinent lymphedema risk reduction practices relevant to her diagnosis specifically related to skin care.   Time  1    Period  Days  Status  Achieved      Patient will be able to return demonstrate and/or verbalize understanding of the post-op home exercise program related to  regaining shoulder range of motion.   Time  1    Period  Days    Status  Achieved      Patient will be able to verbalize understanding of the importance of attending the postoperative After Breast Cancer Class for further lymphedema risk reduction education and therapeutic exercise.   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 It measures 2.1 cm and is located in the upper inner quadrant with a Ki67 of 60%. She has a known positive axillary node. Her multidisciplinary medical team met prior to her assessments to determine a recommended treatment plan. She is planning to have neoadjuvant chemotherapy followed by a breast surgery (mastectomy or lumpectomy) with targeted axillary node dissection, radiation, and anti-estrogen therapy. She will benefit from post op PT to regain shoulder ROM and reduce lymphedema risk.    History and Personal Factors relevant to plan of care:  Unknown extent of disease; positive axillary node; anxiety    Clinical Presentation  Evolving    Clinical Presentation due to:  Unknown extent of disease until staging scans are performed    Clinical Decision Making  Moderate    Rehab Potential  Excellent    Clinical Impairments Affecting Rehab Potential  None    PT Frequency  One time visit    PT Treatment/Interventions  ADLs/Self Care Home Management;Therapeutic exercise;Patient/family education    PT Next Visit Plan  Will reassess post op if MD refers her    PT Home Exercise Plan  Post op shoulder ROM HEP    Consulted and Agree with Plan of Care  Patient;Family member/caregiver    Family Member Consulted  Husband and mother       Patient will benefit from skilled therapeutic intervention in order to improve the following deficits and impairments:  Postural dysfunction, Decreased range of  motion, Decreased strength, Impaired UE functional use, Pain, Decreased knowledge of precautions  Visit Diagnosis: Malignant neoplasm of upper-inner quadrant of right breast in female, estrogen receptor positive ([place]) - Plan: PT plan of care cert/re-cert  Abnormal posture - Plan: PT plan of care cert/re-cert   Patient will follow up at outpatient cancer rehab 3-4 weeks following surgery.  If the patient requires physical therapy at that time, a specific plan will be dictated and sent to the referring physician for approval. The patient was educated today on appropriate basic range of motion exercises to begin post operatively and the importance of attending the After Breast Cancer class following surgery.  Patient was educated today on lymphedema risk reduction practices as it pertains to recommendations that will benefit the patient immediately following surgery.  She verbalized good understanding.    Problem List Patient Active Problem List   Diagnosis Date Noted  . Malignant neoplasm of upper-inner quadrant of right breast in female, estrogen receptor positive ([place]) 03/10/2018   Annia Friendly, PT 03/17/18 2:57 PM  [place], [place], [address] Phone: [phone]   Fax:  [phone]  Name: Claire Guzman MRN: [id number] Date of Birth: 03/26/1966

## 2018-03-17 NOTE — Progress Notes (Signed)
START ON PATHWAY REGIMEN - Breast     A cycle is every 21 days:     Pertuzumab      Pertuzumab      Trastuzumab-xxxx      Trastuzumab-xxxx      Carboplatin      Docetaxel   **Always confirm dose/schedule in your pharmacy ordering system**  Patient Characteristics: Preoperative or Nonsurgical Candidate (Clinical Staging), Neoadjuvant Therapy followed by Surgery, Invasive Disease, Chemotherapy, HER2 Positive, ER Positive Therapeutic Status: Preoperative or Nonsurgical Candidate (Clinical Staging) AJCC M Category: cM0 AJCC Grade: G3 Breast Surgical Plan: Neoadjuvant Therapy followed by Surgery ER Status: Positive (+) AJCC 8 Stage Grouping: IB HER2 Status: Positive (+) AJCC T Category: cT2 AJCC N Category: cN1 PR Status: Positive (+) Intent of Therapy: Curative Intent, Discussed with Patient 

## 2018-03-17 NOTE — Patient Instructions (Signed)

## 2018-03-17 NOTE — Progress Notes (Addendum)
Radiation Oncology         [phone] ________________________________  Initial inpatient Consultation  Name: Claire Guzman MRN: [id number]  Date: 03/17/2018  DOB: [birth year]  [id number], [place]ib[place], MD  Fanny Skates, MD   REFERRING PHYSICIAN: Fanny Skates, MD  DIAGNOSIS:    ICD-10-CM   1. Malignant neoplasm of upper-inner quadrant of right breast in female, estrogen receptor positive ([place]) C50.211    Z17.0   Cancer Staging Malignant neoplasm of upper-inner quadrant of right breast in female, estrogen receptor positive ([place]) Staging form: Breast, AJCC 8th Edition - Clinical stage from 03/08/2018: Stage IB (cT2, cN1, cM0, G3, ER+, PR+, HER2+) - Signed by Truitt Merle, MD on 03/16/2018   CHIEF COMPLAINT: Here to discuss management of right breast cancer  HISTORY OF PRESENT ILLNESS::Claire Guzman is a 52 y.o. female who presented with right breast palpable mass upon a physical exam by her gynecologist, but due to posterior position of the mass, no breast abnormality was appreciated on mammography.  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  ER status: 90% positive; PR status 5% positive, Her2 status positive; Grade 3.  These findings were noted in the right breast at 2:30.  The right axillary node was positive for metastatic carcinoma   On review of systems she reports no symptoms other than the palpable breast mass  She has been caring for her husband since he had a traumatic accident  PREVIOUS RADIATION THERAPY: No  PAST MEDICAL HISTORY:  has a past medical history of Abdominal pain, unspecified site, Anxiety state, unspecified, Contact dermatitis and other eczema, due to unspecified cause, Essential hypertension, benign, Hematuria, unspecified, Hypertension, Mixed hyperlipidemia, Rosacea, and Unspecified symptom associated with female genital organs.     PAST SURGICAL HISTORY: Past Surgical History:  Procedure Laterality Date  . CESAREAN SECTION    . LAPAROSCOPIC APPENDECTOMY      FAMILY HISTORY: family history includes Cancer in her paternal grandfather; Colon cancer in her maternal grandfather, other, and other; Heart attack in her maternal grandfather and maternal grandmother; Heart failure in her mother; Hypertension in her unknown relative; Melanoma in her mother; Prostate cancer (age of onset: 54) in her father; Stroke in her paternal grandmother.  SOCIAL HISTORY:  reports that she has never smoked. She has never used smokeless tobacco. She reports that she does not drink alcohol or use drugs.  ALLERGIES: Codeine; Metrogel [metronidazole]; Other; Paxil [paroxetine hcl]; Prednisone; Tramadol; and Zomig [zolmitriptan]  MEDICATIONS:  Current Outpatient Medications  Medication Sig Dispense Refill  . ALPRAZolam (XANAX) 0.25 MG tablet Take 0.25 mg by mouth at bedtime as needed for sleep or anxiety.    Marland Kitchen atenolol (TENORMIN) 50 MG tablet Take 50 mg by mouth at bedtime.    Marland Kitchen dexamethasone (DECADRON) 4 MG tablet Take 1 tablet (4 mg total) by mouth 2 (two) times daily. Start the day before Taxotere. Take once the day after, then 2 times a day x 2d. 30 tablet 1  . diclofenac (VOLTAREN) 75 MG EC tablet TAKE 1 TABLET(75 MG) BY MOUTH TWICE DAILY 50 tablet 0  . levonorgestrel-ethinyl estradiol (VIENVA) 0.1-20 MG-MCG tablet Take 1 tablet by mouth daily.    Marland Kitchen lidocaine-prilocaine (EMLA) cream Apply to affected area once 30 g 3  . ondansetron (ZOFRAN) 8 MG tablet Take 1 tablet (8 mg total) by mouth 2 (two) times daily as needed (Nausea or  vomiting). Begin 4 days after chemotherapy. 30 tablet 1  . prochlorperazine (COMPAZINE) 10 MG tablet Take 1 tablet (10 mg total) by mouth every 6 (six) hours as needed (Nausea or vomiting). 30 tablet 1   No current facility-administered medications for this encounter.     REVIEW OF SYSTEMS: A 10+ POINT REVIEW OF  SYSTEMS WAS OBTAINED including neurology, dermatology, psychiatry, cardiac, respiratory, lymph, extremities, GI, GU, Musculoskeletal, constitutional, breasts, reproductive, HEENT.  All pertinent positives are noted in the HPI.  All others are negative.   PHYSICAL EXAM:  Vitals with BMI 03/17/2018  Height 5' 4"   Weight 163 lbs 6 oz  BMI 03.70  Systolic 488  Diastolic 62  Pulse 63  Respirations 18   General: Alert and oriented, in no acute distress HEENT: Head is normocephalic. Extraocular movements are intact. Oropharynx is clear. Neck: Neck is supple, no palpable cervical or supraclavicular lymphadenopathy. Heart: Regular in rate and rhythm with no murmurs, rubs, or gallops. Chest: Clear to auscultation bilaterally, with no rhonchi, wheezes, or rales. Abdomen: Soft, nontender, nondistended, with no rigidity or guarding. Extremities: No cyanosis or edema. Lymphatics: see Neck Exam Skin: No concerning lesions. Musculoskeletal: symmetric strength and muscle tone throughout. Neurologic: Cranial nerves II through XII are grossly intact. No obvious focalities. Speech is fluent. Coordination is intact. Psychiatric: Judgment and insight are intact. Affect is appropriate. Breasts: Large palpable mass at the 3 o'clock position of the right breast.  This is fairly central and several centimeters in dimension, likely enlarged in part due to biopsy. No other palpable masses appreciated in the breasts or axillae    ECOG = 0  0 - Asymptomatic (Fully active, able to carry on all predisease activities without restriction)  1 - Symptomatic but completely ambulatory (Restricted in physically strenuous activity but ambulatory and able to carry out work of a light or sedentary nature. For example, light housework, office work)  2 - Symptomatic, <50% in bed during the day (Ambulatory and capable of all self care but unable to carry out any work activities. Up and about more than 50% of waking hours)  3 -  Symptomatic, >50% in bed, but not bedbound (Capable of only limited self-care, confined to bed or chair 50% or more of waking hours)  4 - Bedbound (Completely disabled. Cannot carry on any self-care. Totally confined to bed or chair)  5 - Death   Eustace Pen MM, Creech RH, Tormey DC, et al. [phone]). "Toxicity and response criteria of the [facility]". [place] Oncol. 5 (6): 649-55   LABORATORY DATA:  Lab Results  Component Value Date   WBC 6.3 03/17/2018   HGB 13.6 03/17/2018   HCT 39.5 03/17/2018   MCV 91.9 03/17/2018   PLT 226 03/17/2018   CMP     Component Value Date/Time   NA 142 03/17/2018 1217   K 3.3 (L) 03/17/2018 1217   CL 105 03/17/2018 1217   CO2 29 03/17/2018 1217   GLUCOSE 95 03/17/2018 1217   BUN 10 03/17/2018 1217   CREATININE 0.70 03/17/2018 1217   CALCIUM 9.0 03/17/2018 1217   PROT 7.3 03/17/2018 1217   ALBUMIN 3.9 03/17/2018 1217   AST 17 03/17/2018 1217   ALT 11 03/17/2018 1217   ALKPHOS 84 03/17/2018 1217   BILITOT 0.5 03/17/2018 1217   GFRNONAA >60 03/17/2018 1217   GFRAA >60 03/17/2018 1217         RADIOGRAPHY: US Breast Ltd Uni Right Inc Axilla  Result Date: 03/04/2018  CLINICAL DATA:  52 year old female presenting for evaluation of a palpable lump in the right breast. EXAM: DIGITAL DIAGNOSTIC RIGHT MAMMOGRAM WITH CAD AND TOMO ULTRASOUND RIGHT BREAST COMPARISON:  Previous exam(s). ACR Breast Density Category c: The breast tissue is heterogeneously dense, which may obscure small masses. FINDINGS: No suspicious mammographic findings are seen on the spot compression tomosynthesis images through the palpable area of concern. Mammographic images were processed with CAD. On physical exam, there is a firm palpable mass deep and slightly medial to the right nipple. Ultrasound of the right breast at 2:30, 1 cm from the nipple demonstrates an oval hypoechoic mass with indistinct margins measuring 2.0 x 1.6 x 2.1 cm. This is abutting the chest  wall. There is a thickened lymph node in the right axilla with cortex measuring approximately 6 mm. IMPRESSION: 1. There is a suspicious mass in the right breast at 2:30 at the palpable site of concern. 2.  There is 1 suspicious lymph node in the right axilla. RECOMMENDATION: 1. Ultrasound guided biopsy is recommended for the right breast mass and for the right axillary lymph node. This has been scheduled for 03/08/2018 at 12:45 p.m. 2. If the pathology results indicate malignancy, MRI should be considered to evaluate for involvement of the chest wall given the far posterior positioning of the mass. I have discussed the findings and recommendations with the patient. Results were also provided in writing at the conclusion of the visit. If applicable, a reminder letter will be sent to the patient regarding the next appointment. BI-RADS CATEGORY  4: Suspicious. Electronically Signed   By: Ammie Ferrier M.D.   On: 03/04/2018 11:00   Mm Diag Breast Tomo Uni Right  Result Date: 03/04/2018 CLINICAL DATA:  52 year old female presenting for evaluation of a palpable lump in the right breast. EXAM: DIGITAL DIAGNOSTIC RIGHT MAMMOGRAM WITH CAD AND TOMO ULTRASOUND RIGHT BREAST COMPARISON:  Previous exam(s). ACR Breast Density Category c: The breast tissue is heterogeneously dense, which may obscure small masses. FINDINGS: No suspicious mammographic findings are seen on the spot compression tomosynthesis images through the palpable area of concern. Mammographic images were processed with CAD. On physical exam, there is a firm palpable mass deep and slightly medial to the right nipple. Ultrasound of the right breast at 2:30, 1 cm from the nipple demonstrates an oval hypoechoic mass with indistinct margins measuring 2.0 x 1.6 x 2.1 cm. This is abutting the chest wall. There is a thickened lymph node in the right axilla with cortex measuring approximately 6 mm. IMPRESSION: 1. There is a suspicious mass in the right breast at  2:30 at the palpable site of concern. 2.  There is 1 suspicious lymph node in the right axilla. RECOMMENDATION: 1. Ultrasound guided biopsy is recommended for the right breast mass and for the right axillary lymph node. This has been scheduled for 03/08/2018 at 12:45 p.m. 2. If the pathology results indicate malignancy, MRI should be considered to evaluate for involvement of the chest wall given the far posterior positioning of the mass. I have discussed the findings and recommendations with the patient. Results were also provided in writing at the conclusion of the visit. If applicable, a reminder letter will be sent to the patient regarding the next appointment. BI-RADS CATEGORY  4: Suspicious. Electronically Signed   By: Ammie Ferrier M.D.   On: 03/04/2018 11:00   [place] Axillary Node Core Biopsy Right  Addendum Date: 03/10/2018   ADDENDUM REPORT: 03/09/2018 15:37 ADDENDUM: Pathology revealed GRADE III INVASIVE  DUCTAL CARCINOMA, LYMPHOVASCULAR SPACE INVOLVEMENT BY TUMOR of the Right breast, 2:30 o'clock. METASTATIC CARCINOMA INVOLVING SCANT LYMPHOID TISSUE of the Right axilla. This was found to be concordant by Dr. Lillia Mountain. Pathology results were discussed with the patient by telephone. The patient reported doing well after the biopsies with tenderness at the sites. Post biopsy instructions and care were reviewed and questions were answered. The patient was encouraged to call [facility] for any additional concerns. The patient was referred to [facility] on March 17, 2018. Bilateral breast MRI should be considered to evaluate for involvement of the chest wall given the far posterior positioning of the mass and density. Pathology results reported by Terie Purser, RN on 03/09/2018. Electronically Signed   By: Lillia Mountain M.D.   On: 03/09/2018 15:37   Result Date: 03/10/2018 CLINICAL DATA:  Palpable mass  in the 2:30 region of the right breast and right axillary adenopathy. EXAM: ULTRASOUND GUIDED CORE NEEDLE BIOPSIES OF A RIGHT AXILLARY NODE AND A MASS IN THE 2:30 REGION OF THE RIGHT BREAST COMPARISON:  Previous exam(s). FINDINGS: I met with the patient and we discussed the procedure of ultrasound-guided biopsy, including benefits and alternatives. We discussed the high likelihood of a successful procedure. We discussed the risks of the procedure, including infection, bleeding, tissue injury, clip migration, and inadequate sampling. Informed written consent was given. The usual time-out protocol was performed immediately prior to the procedure. Using sterile technique and 1% lidocaine and 1% lidocaine with epinephrine is local anesthetic, under direct ultrasound visualization, a 14 gauge spring-loaded device was used to perform biopsy of a mass in the 2:30 region of the right breast using a medial to lateral approach. At the conclusion of the procedure a ribbon shaped tissue marker clip was deployed into the biopsy cavity. Follow up 2 view mammogram was performed and dictated separately. I met with the patient and we discussed the procedure of ultrasound-guided biopsy, including benefits and alternatives. We discussed the high likelihood of a successful procedure. We discussed the risks of the procedure, including infection, bleeding, tissue injury, clip migration, and inadequate sampling. Informed written consent was given. The usual time-out protocol was performed immediately prior to the procedure. Using sterile technique and 1% lidocaine and 1% lidocaine with epinephrine as local anesthetic, under direct ultrasound visualization, a 14 gauge spring-loaded device was used to perform biopsy of a right axillary lymph node using a lateral to medial approach. At the conclusion of the procedure a HydroMARK tissue marker clip was deployed into the biopsy cavity. Follow up 2 view mammogram was performed and dictated  separately. IMPRESSION: Ultrasound guided biopsies of the right breast and right axilla. No apparent complications. Electronically Signed: By: Lillia Mountain M.D. On: 03/08/2018 13:51   Mm Clip Placement Right  Result Date: 03/08/2018 CLINICAL DATA:  Status post ultrasound-guided core biopsies of a mass in the right breast at 2:30 and a right axillary lymph node. EXAM: DIAGNOSTIC RIGHT MAMMOGRAM POST ULTRASOUND BIOPSIES OF THE RIGHT BREAST AND RIGHT AXILLA COMPARISON:  Previous exam(s). FINDINGS: Mammographic images were obtained following ultrasound guided biopsies of a mass in the 2:30 region of the right breast and right axilla. Mammographic images do not show the clip in the mass at 2:30 or in the axilla likely secondary to the far posterior locations of both biopsied areas. IMPRESSION: Status post ultrasound-guided core biopsies of a mass in the 2:30 region of the right  breast and right axilla with pathology pending. Final Assessment: Post Procedure Mammograms for Marker Placement Electronically Signed   By: Lillia Mountain M.D.   On: 03/08/2018 14:15   [place] Rt Breast Bx W Loc Dev 1st Lesion Img Bx Spec US Guide  Addendum Date: 03/10/2018   ADDENDUM REPORT: 03/09/2018 15:37 ADDENDUM: Pathology revealed GRADE III INVASIVE DUCTAL CARCINOMA, LYMPHOVASCULAR SPACE INVOLVEMENT BY TUMOR of the Right breast, 2:30 o'clock. METASTATIC CARCINOMA INVOLVING SCANT LYMPHOID TISSUE of the Right axilla. This was found to be concordant by Dr. Lillia Mountain. Pathology results were discussed with the patient by telephone. The patient reported doing well after the biopsies with tenderness at the sites. Post biopsy instructions and care were reviewed and questions were answered. The patient was encouraged to call [facility] for any additional concerns. The patient was referred to [facility] at [facility] on March 17, 2018. Bilateral breast MRI  should be considered to evaluate for involvement of the chest wall given the far posterior positioning of the mass and density. Pathology results reported by Terie Purser, RN on 03/09/2018. Electronically Signed   By: Lillia Mountain M.D.   On: 03/09/2018 15:37   Result Date: 03/10/2018 CLINICAL DATA:  Palpable mass in the 2:30 region of the right breast and right axillary adenopathy. EXAM: ULTRASOUND GUIDED CORE NEEDLE BIOPSIES OF A RIGHT AXILLARY NODE AND A MASS IN THE 2:30 REGION OF THE RIGHT BREAST COMPARISON:  Previous exam(s). FINDINGS: I met with the patient and we discussed the procedure of ultrasound-guided biopsy, including benefits and alternatives. We discussed the high likelihood of a successful procedure. We discussed the risks of the procedure, including infection, bleeding, tissue injury, clip migration, and inadequate sampling. Informed written consent was given. The usual time-out protocol was performed immediately prior to the procedure. Using sterile technique and 1% lidocaine and 1% lidocaine with epinephrine is local anesthetic, under direct ultrasound visualization, a 14 gauge spring-loaded device was used to perform biopsy of a mass in the 2:30 region of the right breast using a medial to lateral approach. At the conclusion of the procedure a ribbon shaped tissue marker clip was deployed into the biopsy cavity. Follow up 2 view mammogram was performed and dictated separately. I met with the patient and we discussed the procedure of ultrasound-guided biopsy, including benefits and alternatives. We discussed the high likelihood of a successful procedure. We discussed the risks of the procedure, including infection, bleeding, tissue injury, clip migration, and inadequate sampling. Informed written consent was given. The usual time-out protocol was performed immediately prior to the procedure. Using sterile technique and 1% lidocaine and 1% lidocaine with epinephrine as local anesthetic, under  direct ultrasound visualization, a 14 gauge spring-loaded device was used to perform biopsy of a right axillary lymph node using a lateral to medial approach. At the conclusion of the procedure a HydroMARK tissue marker clip was deployed into the biopsy cavity. Follow up 2 view mammogram was performed and dictated separately. IMPRESSION: Ultrasound guided biopsies of the right breast and right axilla. No apparent complications. Electronically Signed: By: Lillia Mountain M.D. On: 03/08/2018 13:51      IMPRESSION/PLAN: Right breast cancer with lymph node involvement  She will undergo an MRI to further stage her breasts.  She will also undergo systemic staging scans  She will undergo Port-A-Cath placement and genetic counseling.  The consensus by her breast cancer team is that she will benefit from neoadjuvant  chemotherapy.  Breast conserving surgery will follow if feasible, otherwise mastectomy.  It was a pleasure meeting the patient today. We discussed the risks, benefits, and side effects of radiotherapy. I recommend radiotherapy to the right breast and regional nodes to reduce her risk of locoregional recurrence by 2/3.  We discussed that radiation would take approximately 6-7 weeks to complete and that I would give the patient a few weeks to heal following surgery before starting treatment planning. We spoke about acute effects including skin irritation and fatigue as well as much less common late effects including internal organ injury or irritation. We spoke about the latest technology that is used to minimize the risk of late effects for patients undergoing radiotherapy to the breast or chest wall. No guarantees of treatment were given. The patient is enthusiastic about proceeding with treatment. I look forward to participating in the patient's care.  I will await her referral back to me for postoperative follow-up and eventual CT simulation/treatment  planning.  __________________________________________   Eppie Gibson, MD

## 2018-03-18 ENCOUNTER — Encounter: Payer: Self-pay | Admitting: General Practice

## 2018-03-18 NOTE — Progress Notes (Signed)
[facility] Psychosocial Distress Screening Spiritual Care  Met with Claire Guzman and husband Claire Guzman in [facility] to introduce [facility] team/resources, reviewing distress screen per protocol.  The patient scored a 5 on the Psychosocial Distress Thermometer which indicates moderate distress. Also assessed for distress and other psychosocial needs.   ONCBCN DISTRESS SCREENING 03/18/2018  Screening Type Initial Screening  Distress experienced in past week (1-10) 5  Family Problem type Other (comment)  Referral to support programs Yes    Claire Guzman is understandably feeling anxiety about facing chemo and long tx process. She Designer[employer] referral for Nutritional therapist. Encouraged her to consider Breast Cancer Support Group and other programming resources as means for coping, making meaning, and enjoying creative distraction.   Follow up needed: Yes.  Claire Guzman also welcomes f/u in infusion, so I plan to connect with her when she is on campus. She also knows to contact Team anytime, but please page if immediate needs arise or circumstances change. Thank you.   [place], [place], [facility] Pager [phone] Voicemail [phone]

## 2018-03-19 ENCOUNTER — Telehealth: Payer: Self-pay

## 2018-03-19 ENCOUNTER — Telehealth: Payer: Self-pay | Admitting: *Deleted

## 2018-03-19 ENCOUNTER — Encounter: Payer: Self-pay | Admitting: *Deleted

## 2018-03-19 NOTE — Telephone Encounter (Signed)
She is currently scheduled to start chemo 9/10, I will see if we can move her chemo up. Thanks   Truitt Merle MD

## 2018-03-19 NOTE — Telephone Encounter (Signed)
Patient calls stating she received a call from Dr. Darrel Hoover office for appointment for her [facility] placement for 9/9.  She is concerned because no appointments have been made to start her chemotherapy. She thought it was going to start the week prior.   Explained to her I will review with Dr. Burr Medico and call her back.   Her [phone]

## 2018-03-19 NOTE — Telephone Encounter (Signed)
Spoke to pt concerning Claire Guzman from 8.21.19. Denies questions or concerns regarding dx or treatment care plan. Encourage pt to call with needs. Discussed placed orders for pt to start chemo on 9/10 after port placement on 9/9. Received verbal understanding.

## 2018-03-20 ENCOUNTER — Ambulatory Visit (HOSPITAL_COMMUNITY)
Admission: RE | Admit: 2018-03-20 | Discharge: 2018-03-20 | Disposition: A | Discharge status: Home / Self Care | Payer: BLUE CROSS/BLUE SHIELD | Source: Ambulatory Visit | Attending: Hematology | Admitting: Hematology

## 2018-03-20 DIAGNOSIS — Z170 Estrogen receptor positive status [ER+]: Secondary | ICD-10-CM | POA: Insufficient documentation

## 2018-03-20 DIAGNOSIS — C50211 Malignant neoplasm of upper-inner quadrant of right female breast: Secondary | ICD-10-CM | POA: Insufficient documentation

## 2018-03-20 DIAGNOSIS — C50919 Malignant neoplasm of unspecified site of unspecified female breast: Secondary | ICD-10-CM | POA: Diagnosis not present

## 2018-03-20 MED ORDER — GADOBENATE DIMEGLUMINE 529 MG/ML IV SOLN
15.0000 mL | Freq: Once | INTRAVENOUS | Status: AC | PRN
  Administered 2018-03-20: 15 mL via INTRAVENOUS

## 2018-03-22 ENCOUNTER — Encounter: Payer: Self-pay | Admitting: Licensed Clinical Social Worker

## 2018-03-22 ENCOUNTER — Telehealth: Payer: Self-pay | Admitting: Hematology

## 2018-03-22 ENCOUNTER — Telehealth: Payer: Self-pay

## 2018-03-22 NOTE — Telephone Encounter (Signed)
Patient has been added to infusion log for treatment week of 9/3/ patient has been notified as well. Per 8/26 staff message

## 2018-03-22 NOTE — Telephone Encounter (Signed)
-----   Message from Truitt Merle, MD sent at 03/21/2018 10:52 AM EDT ----- Please let pt know the MRI result, thanks   Truitt Merle  03/21/2018

## 2018-03-22 NOTE — Telephone Encounter (Signed)
Spoke with patient per Dr. Burr Medico notified her MRI Brain results are negative, patient verbalized an understanding, and appreciated my call.

## 2018-03-23 ENCOUNTER — Telehealth: Payer: Self-pay

## 2018-03-23 ENCOUNTER — Encounter: Payer: Self-pay | Admitting: Licensed Clinical Social Worker

## 2018-03-23 ENCOUNTER — Inpatient Hospital Stay: Payer: BLUE CROSS/BLUE SHIELD | Admitting: *Deleted

## 2018-03-23 ENCOUNTER — Inpatient Hospital Stay (HOSPITAL_BASED_OUTPATIENT_CLINIC_OR_DEPARTMENT_OTHER): Payer: BLUE CROSS/BLUE SHIELD | Admitting: Licensed Clinical Social Worker

## 2018-03-23 ENCOUNTER — Inpatient Hospital Stay: Payer: BLUE CROSS/BLUE SHIELD

## 2018-03-23 DIAGNOSIS — Z800 Family history of malignant neoplasm of digestive organs: Secondary | ICD-10-CM

## 2018-03-23 DIAGNOSIS — Z8042 Family history of malignant neoplasm of prostate: Secondary | ICD-10-CM

## 2018-03-23 DIAGNOSIS — C50211 Malignant neoplasm of upper-inner quadrant of right female breast: Secondary | ICD-10-CM

## 2018-03-23 DIAGNOSIS — Z170 Estrogen receptor positive status [ER+]: Secondary | ICD-10-CM

## 2018-03-23 DIAGNOSIS — Z808 Family history of malignant neoplasm of other organs or systems: Secondary | ICD-10-CM

## 2018-03-23 NOTE — Telephone Encounter (Signed)
Spoke with patient denies fever or chills, right breast discomfort, is taking Ibuprofen, per Cira Rue NP instructed her to use warm compresses to right breast 3 to 4 times a day for 10 to 15 minutes at a time, call back if she develops fever, chills, reddish in the breast.   Patient verbalized an understanding.

## 2018-03-23 NOTE — Progress Notes (Signed)
REFERRING PROVIDER: Truitt Merle, MD [place], [place] [address]  PRIMARY PROVIDER:  Lujean Amel, MD  PRIMARY REASON FOR VISIT:  1. Malignant neoplasm of upper-inner quadrant of right breast in female, estrogen receptor positive ([place])   2. Family history of colon cancer   3. Family history of prostate cancer   4. Family history of melanoma      HISTORY OF PRESENT ILLNESS:   Claire Guzman, a 52 y.o. female, was seen for a [place] cancer genetics consultation at the request of Claire Guzman due to a personal and family history of cancer.  Claire Guzman presents to clinic today to discuss the possibility of a hereditary predisposition to cancer, genetic testing, and to further clarify her future cancer risks, as well as potential cancer risks for family members.   In 2019, at the age of 36, Claire Guzman was diagnosed with right breast cancer, IDC, ER+/PR+/Her2+. This is being treated with neoadjuvant chemotherapy, surgery to follow.  CANCER HISTORY:  Oncology History   Cancer Staging Malignant neoplasm of upper-inner quadrant of right breast in female, estrogen receptor positive ([place]) Staging form: Breast, AJCC 8th Edition - Clinical stage from 03/08/2018: Stage IB (cT2, cN1, cM0, G3, ER+, PR+, HER2+) - Signed by Claire Merle, MD on 03/16/2018       Malignant neoplasm of upper-inner quadrant of right breast in female, estrogen receptor positive ([place])   02/24/2018 Mammogram    screening mammogram on 02/24/2018 that was benign    03/04/2018 Mammogram    diagnostic mammogram on 03/04/2018 due to a palpable mass on her right breast. Diagnostic mammogram, breast US revealed and biopsy revealed suspicious mass in the right breast at 2:30 at the palpable site of concern and one suspicious right axillary LN.    03/08/2018 Cancer Staging    Staging form: Breast, AJCC 8th Edition - Clinical stage from 03/08/2018: Stage IB (cT2, cN1, cM0, G3, ER+, PR+, HER2+) - Signed by Claire Merle, MD on  03/16/2018    03/08/2018 Receptors her2    Her2 positive, ER 90% PR 5% and Ki67 60%    03/08/2018 Pathology Results    Diagnosis 1. Breast, right, needle core biopsy, 2:30 - INVASIVE DUCTAL CARCINOMA, GRADE 3. - LYMPHOVASCULAR SPACE INVOLVEMENT BY TUMOR. 2. Lymph node, needle/core biopsy, right axilla - METASTATIC CARCINOMA INVOLVING SCANT LYMPHOID TISSUE.    03/10/2018 Initial Diagnosis    Malignant neoplasm of upper-inner quadrant of right breast in female, estrogen receptor positive ([place])    03/30/2018 -  Chemotherapy    The patient had PALONOSETRON HCL INJECTION 0.25 MG/5ML, 0.25 mg, Intravenous,  Once, 0 of 6 cycles pegfilgrastim-cbqv (UDENYCA) injection 6 mg, 6 mg, Subcutaneous, Once, 0 of 6 cycles trastuzumab (HERCEPTIN) 588 mg in sodium chloride 0.9 % 250 mL chemo infusion, 8 mg/kg, Intravenous,  Once, 0 of 6 cycles CARBOplatin (PARAPLATIN) in sodium chloride 0.9 % 100 mL chemo infusion, , Intravenous,  Once, 0 of 6 cycles DOCEtaxel (TAXOTERE) 140 mg in sodium chloride 0.9 % 250 mL chemo infusion, 75 mg/m2, Intravenous,  Once, 0 of 6 cycles pertuzumab (PERJETA) 840 mg in sodium chloride 0.9 % 250 mL chemo infusion, 840 mg, Intravenous, Once, 0 of 6 cycles FOSAPREPITANT 150MG + DEXAMETHASONE INFUSION [facility], , Intravenous,  Once, 0 of 6 cycles  for chemotherapy treatment.       HORMONAL RISK FACTORS:  Menarche was at age 48.  First live birth at age 2.  OCP use for approximately 30 years.  Ovaries intact:  yes.  Hysterectomy: no.   Menopausal status: premenopausal.  HRT use: 0 years. Colonoscopy: no; not examined. Mammogram within the last year: yes. Number of breast biopsies: 1.  Past Medical History:  Diagnosis Date  . Abdominal pain, unspecified site   . Anxiety state, unspecified   . Contact dermatitis and other eczema, due to unspecified cause   . Essential hypertension, benign   . Family history of colon cancer   . Family history of melanoma   . Family history of  prostate cancer   . Hematuria, unspecified   . Hypertension   . Mixed hyperlipidemia   . Rosacea   . Unspecified symptom associated with female genital organs     Past Surgical History:  Procedure Laterality Date  . CESAREAN SECTION    . LAPAROSCOPIC APPENDECTOMY      Social History   Socioeconomic History  . Marital status: Married    Spouse name: Not on file  . Number of children: Not on file  . Years of education: Not on file  . Highest education level: Not on file  Occupational History  . Not on file  Social Needs  . Financial resource strain: Not on file  . Food insecurity:    Worry: Not on file    Inability: Not on file  . Transportation needs:    Medical: Not on file    Non-medical: Not on file  Tobacco Use  . Smoking status: Never Smoker  . Smokeless tobacco: Never Used  Substance and Sexual Activity  . Alcohol use: No  . Drug use: Never  . Sexual activity: Not on file  Lifestyle  . Physical activity:    Days per week: Not on file    Minutes per session: Not on file  . Stress: Not on file  Relationships  . Social connections:    Talks on phone: Not on file    Gets together: Not on file    Attends religious service: Not on file    Active member of club or organization: Not on file    Attends meetings of clubs or organizations: Not on file    Relationship status: Not on file  Other Topics Concern  . Not on file  Social History Narrative  . Not on file     FAMILY HISTORY:  We obtained a detailed, 4-generation family history.  Significant diagnoses are listed below: Family History  Problem Relation Age of Onset  . Heart failure Mother   . Melanoma Mother 63       has had several removed over the past ten years  . Prostate cancer Father 39  . Heart attack Maternal Grandmother   . Heart attack Maternal Grandfather        possible cancer, unknown type  . Stroke Paternal Grandmother   . Colon cancer Paternal Grandfather        dx upper 20s  .  Hypertension Unknown   . Heart Problems Maternal Aunt        d.60  . Heart Problems Maternal Uncle        d.60  . Colon cancer Paternal Aunt        dx 55s  . Colon cancer Paternal Uncle        dx 55s   Claire Guzman has one daughter, age 58. She has a sister, age 82, who has a son age 29.   Ms. Colledge's father was diagnosed with prostate cancer at age 5, he is living at 53  and doing well. He has two sisters, and two brothers. One of his sisters was diagnosed with colon cancer in her 51's and passed away in her 92's. His other sister is living in her 31's. One of his brothers was diagnosed with colon cancer in his 80's and passed away at 72. Ms. Novoa has 8 paternal cousins, no cancer history. Ms. Brundidge's paternal grandfather was diagnosed with colon cancer in his upper 1's and passed away in his upper 110's. Her paternal grandmother died in her 4's of a stroke.   Ms. Convery's mother has had several melanomas since age 78. These have been located on her head, neck, face and back. She is living at age 39. She has a sister and a brother, both passed away at age 64 of heart issues. Ms. Scherger has 7 maternal cousins. One of them she believes had cancer and died in her 40's but she does not know what type of cancer. The other cousins are cancer free. Ms. Soden's maternal grandfather also possibly had cancer but she does not know what type, he died in his 56's. Ms. Genova's maternal grandmother died at 74.  Ms. Bade is unaware of previous family history of genetic testing for hereditary cancer risks. Patient's maternal ancestors are of [place] descent, and paternal ancestors are of [place] descent. There is no reported Ashkenazi Jewish ancestry. There is no known consanguinity.  GENETIC COUNSELING ASSESSMENT: Claire Guzman is a 52 y.o. female with a personal and family history which is somewhat suggestive of a Hereditary Cancer Predisposition Syndrome. We, therefore, discussed and  recommended the following at today's visit.   DISCUSSION: We discussed that about 5-10% of breast cancer cases are hereditary with most cases due to BRCA mutations.  Other genes associated with hereditary breast cancer cases include ATM, CHEK2 and PALB2.  We reviewed the characteristics, features and inheritance patterns of hereditary cancer syndromes. We also discussed genetic testing, including the appropriate family members to test, the process of testing, insurance coverage and turn-around-time for results. We discussed the implications of a negative, positive and/or variant of uncertain significant result. We recommended (patient) pursue genetic testing for the [facility] Common Hereditary Cancers Panel.  The Common Hereditary Cancers Panel offered by [facility] includes sequencing and/or deletion duplication testing of the following 47 genes: APC, ATM, AXIN2, BARD1, BMPR1A, BRCA1, BRCA2, BRIP1, CDH1, CDKN2A (p14ARF), CDKN2A (p16INK4a), CKD4, CHEK2, CTNNA1, DICER1, EPCAM (Deletion/duplication testing only), GREM1 (promoter region deletion/duplication testing only), KIT, MEN1, MLH1, MSH2, MSH3, MSH6, MUTYH, NBN, NF1, NHTL1, PALB2, PDGFRA, PMS2, POLD1, POLE, PTEN, RAD50, RAD51C, RAD51D, SDHB, SDHC, SDHD, SMAD4, SMARCA4. STK11, TP53, TSC1, TSC2, and VHL.  The following genes were evaluated for sequence changes only: SDHA and HOXB13 c.251G>A variant only.  We discussed that if she is found to have a mutation in one of these genes, it may impact surgical decisions, and alter future medical management recommendations such as increased cancer screenings and consideration of risk reducing surgeries.  A positive result could also have implications for the patient's family members.  A Negative result would mean we were unable to identify a hereditary component to her cancer, but does not rule out the possibility of a hereditary basis for her cancer.  There could be mutations that are undetectable by current technology,  or in genes not yet tested or identified to increase cancer risk.    We discussed the potential to find a Variant of Uncertain Significance or VUS.  These are variants that have not yet  been identified as pathogenic or benign, and it is unknown if this variant is associated with increased cancer risk or if this is a normal finding.  Most VUS's are reclassified to benign or likely benign.   It should not be used to make medical management decisions. With time, we suspect the lab will determine the significance of any VUS's identified if any.   Based on Ms. Costen's personal and family history of cancer, she meets medical criteria for genetic testing. Given her three paternal relatives with colon cancer, she meets Lynch syndrome criteria for testing. Despite that she meets criteria, she may still have an out of pocket cost. The lab will let her know what her out of pocket cost is, if any. Ms. Montalban expressed that her family is low on money at the moment, and said she may wish to cancel it depending on how much it costs and pursue testing at a later time.  PLAN: After considering the risks, benefits, and limitations, Ms. Biswas  provided informed consent to pursue genetic testing and the blood sample was sent to [facility] for analysis of the Common Hereditary Cancers Panel. Results should be available within approximately 2-3 weeks' time, at which point they will be disclosed by telephone to Ms. Fodera, as will any additional recommendations warranted by these results. Ms. Mote will receive a summary of her genetic counseling visit and a copy of her results once available. This information will also be available in Epic. We encouraged Ms. Choo to remain in contact with cancer genetics annually so that we can continuously update the family history and inform her of any changes in cancer genetics and testing that may be of benefit for her family. Ms. Lehrmann's questions were answered to her  satisfaction today. Our contact information was provided should additional questions or concerns arise.  Based on Ms. Dung's family history, we recommended her paternal relatives have genetic counseling and testing as they meet Lynch syndrome criteria as well. Ms. Khatib will let us know if we can be of any assistance in coordinating genetic counseling and/or testing for this family member.   Lastly, we encouraged Ms. Beier to remain in contact with cancer genetics annually so that we can continuously update the family history and inform her of any changes in cancer genetics and testing that may be of benefit for this family.   Ms.  Laningham's questions were answered to her satisfaction today. Our contact information was provided should additional questions or concerns arise. Thank you for the referral and allowing [place] to share in the care of your patient.   Epimenio Foot, MS Genetic Counselor [email or web address] Phone: [phone]  The patient was seen for a total of 40 minutes in face-to-face genetic counseling.This patient was discussed with Drs. Magrinat, Lindi Adie and/or Burr Guzman who agrees with the above.

## 2018-03-23 NOTE — Telephone Encounter (Signed)
Patient calls stating that since last night she has been having intermittent sharp pains in right breast.  Attempted to call back back left voice message for return call.

## 2018-03-24 ENCOUNTER — Telehealth: Payer: Self-pay

## 2018-03-24 ENCOUNTER — Encounter: Payer: Self-pay | Admitting: *Deleted

## 2018-03-24 ENCOUNTER — Inpatient Hospital Stay: Payer: BLUE CROSS/BLUE SHIELD

## 2018-03-24 ENCOUNTER — Other Ambulatory Visit: Payer: Self-pay | Admitting: Hematology

## 2018-03-24 ENCOUNTER — Encounter (HOSPITAL_COMMUNITY)
Admission: RE | Admit: 2018-03-24 | Discharge: 2018-03-24 | Disposition: A | Discharge status: Home / Self Care | Payer: BLUE CROSS/BLUE SHIELD | Source: Ambulatory Visit | Attending: Hematology | Admitting: Hematology

## 2018-03-24 ENCOUNTER — Ambulatory Visit (HOSPITAL_COMMUNITY)
Admission: RE | Admit: 2018-03-24 | Discharge: 2018-03-24 | Disposition: A | Discharge status: Home / Self Care | Payer: BLUE CROSS/BLUE SHIELD | Source: Ambulatory Visit | Attending: Hematology | Admitting: Hematology

## 2018-03-24 ENCOUNTER — Other Ambulatory Visit: Payer: Self-pay | Admitting: General Surgery

## 2018-03-24 DIAGNOSIS — E785 Hyperlipidemia, unspecified: Secondary | ICD-10-CM | POA: Diagnosis not present

## 2018-03-24 DIAGNOSIS — Z170 Estrogen receptor positive status [ER+]: Secondary | ICD-10-CM

## 2018-03-24 DIAGNOSIS — I10 Essential (primary) hypertension: Secondary | ICD-10-CM | POA: Diagnosis not present

## 2018-03-24 DIAGNOSIS — C50211 Malignant neoplasm of upper-inner quadrant of right female breast: Secondary | ICD-10-CM

## 2018-03-24 DIAGNOSIS — C50919 Malignant neoplasm of unspecified site of unspecified female breast: Secondary | ICD-10-CM | POA: Diagnosis not present

## 2018-03-24 MED ORDER — POTASSIUM CHLORIDE CRYS ER 20 MEQ PO TBCR: 20.0000 meq | EXTENDED_RELEASE_TABLET | Freq: Two times a day (BID) | ORAL | 1 refills | Status: DC

## 2018-03-24 MED ORDER — TECHNETIUM TC 99M MEDRONATE IV KIT
20.0000 | PACK | Freq: Once | INTRAVENOUS | Status: AC | PRN
  Administered 2018-03-24: 19 via INTRAVENOUS

## 2018-03-24 MED ORDER — PERFLUTREN LIPID MICROSPHERE
INTRAVENOUS | Status: AC
  Filled 2018-03-24: qty 10

## 2018-03-24 MED ORDER — IOHEXOL 300 MG/ML  SOLN
100.0000 mL | Freq: Once | INTRAMUSCULAR | Status: AC | PRN
  Administered 2018-03-24: 100 mL via INTRAVENOUS

## 2018-03-24 NOTE — Progress Notes (Signed)
  Echocardiogram 2D Echocardiogram has been performed.  Claire Guzman 03/24/2018, 11:20 AM

## 2018-03-24 NOTE — Telephone Encounter (Signed)
[facility] called back and stated patient has preop on 9/5 and port placement on 9/9. I requested they contact this patient again regarding these appointments. They will reach back out to the patient.

## 2018-03-24 NOTE — Progress Notes (Unsigned)
kcl

## 2018-03-24 NOTE — Telephone Encounter (Signed)
Called Dr. Darrel Hoover office spoke with Luellen Pucker regarding patient has not heard anything about when the port will be placed.   She will check into it and call the patient. I requested she leave me a message as well.

## 2018-03-25 ENCOUNTER — Encounter: Payer: Self-pay | Admitting: *Deleted

## 2018-03-25 ENCOUNTER — Ambulatory Visit (HOSPITAL_COMMUNITY)
Admission: RE | Admit: 2018-03-25 | Discharge: 2018-03-25 | Disposition: A | Discharge status: Home / Self Care | Payer: BLUE CROSS/BLUE SHIELD | Source: Ambulatory Visit | Attending: Hematology | Admitting: Hematology

## 2018-03-25 DIAGNOSIS — C50919 Malignant neoplasm of unspecified site of unspecified female breast: Secondary | ICD-10-CM | POA: Diagnosis not present

## 2018-03-25 DIAGNOSIS — C50211 Malignant neoplasm of upper-inner quadrant of right female breast: Secondary | ICD-10-CM

## 2018-03-25 DIAGNOSIS — N6313 Unspecified lump in the right breast, lower outer quadrant: Secondary | ICD-10-CM | POA: Diagnosis not present

## 2018-03-25 DIAGNOSIS — Z170 Estrogen receptor positive status [ER+]: Secondary | ICD-10-CM | POA: Diagnosis not present

## 2018-03-25 DIAGNOSIS — C773 Secondary and unspecified malignant neoplasm of axilla and upper limb lymph nodes: Secondary | ICD-10-CM | POA: Diagnosis not present

## 2018-03-25 DIAGNOSIS — N6311 Unspecified lump in the right breast, upper outer quadrant: Secondary | ICD-10-CM | POA: Diagnosis not present

## 2018-03-25 MED ORDER — GADOBENATE DIMEGLUMINE 529 MG/ML IV SOLN
15.0000 mL | Freq: Once | INTRAVENOUS | Status: AC | PRN
  Administered 2018-03-25: 15 mL via INTRAVENOUS

## 2018-03-26 ENCOUNTER — Other Ambulatory Visit: Payer: Self-pay | Admitting: Hematology

## 2018-03-26 ENCOUNTER — Telehealth: Payer: Self-pay | Admitting: Hematology

## 2018-03-26 ENCOUNTER — Ambulatory Visit: Payer: BLUE CROSS/BLUE SHIELD

## 2018-03-26 DIAGNOSIS — C50211 Malignant neoplasm of upper-inner quadrant of right female breast: Secondary | ICD-10-CM

## 2018-03-26 DIAGNOSIS — Z170 Estrogen receptor positive status [ER+]: Secondary | ICD-10-CM

## 2018-03-26 NOTE — Telephone Encounter (Signed)
I called patient, and discussed her recent echocardiogram, CT scan, bone scan, and bilateral breast MRI findings with her in detail.  She has noticed slightly growths of her right breast mass, no other new complaints.  She denies any significant abdominal pain, cramps, nausea, or other GI symptoms, but she has mild constipation.  CT scan showed possible colitis, she is asymptomatic. Will monitor clinically. No evidence of distant metastasis on the staging scan.    Discussed her breast MRI showed additional satellite lesions next to the known breast cancer in her right breast, and Dr. Dalbert Batman recommended additional biopsy under MRI.  She is agreeable, I placed her biopsy orders.  Plan to do the biopsy next week.  She is scheduled to start first cycle chemotherapy next Tuesday, and I will see her back a week after her chemo, to discuss her biopsy results, and for toxicity checkup.  She voiced good understanding about the plan, and knows to call me if she has further questions.  Truitt Merle  03/26/2018

## 2018-03-30 ENCOUNTER — Other Ambulatory Visit: Payer: Self-pay

## 2018-03-30 ENCOUNTER — Encounter: Payer: Self-pay | Admitting: *Deleted

## 2018-03-30 ENCOUNTER — Inpatient Hospital Stay: Payer: BLUE CROSS/BLUE SHIELD | Attending: Hematology

## 2018-03-30 ENCOUNTER — Inpatient Hospital Stay: Payer: BLUE CROSS/BLUE SHIELD

## 2018-03-30 ENCOUNTER — Other Ambulatory Visit: Payer: Self-pay | Admitting: Hematology

## 2018-03-30 VITALS — BP 148/78 | HR 65 | Temp 98.4°F | Resp 17

## 2018-03-30 DIAGNOSIS — Z5112 Encounter for antineoplastic immunotherapy: Secondary | ICD-10-CM | POA: Insufficient documentation

## 2018-03-30 DIAGNOSIS — Z5189 Encounter for other specified aftercare: Secondary | ICD-10-CM | POA: Insufficient documentation

## 2018-03-30 DIAGNOSIS — C50211 Malignant neoplasm of upper-inner quadrant of right female breast: Secondary | ICD-10-CM

## 2018-03-30 DIAGNOSIS — Z170 Estrogen receptor positive status [ER+]: Secondary | ICD-10-CM

## 2018-03-30 DIAGNOSIS — Z5111 Encounter for antineoplastic chemotherapy: Secondary | ICD-10-CM | POA: Insufficient documentation

## 2018-03-30 LAB — CMP (CANCER CENTER ONLY)
ALK PHOS: 86 U/L (ref 38–126)
ALT: 18 U/L (ref 0–44)
AST: 18 U/L (ref 15–41)
Albumin: 4.1 g/dL (ref 3.5–5.0)
Anion gap: 12 (ref 5–15)
BILIRUBIN TOTAL: 0.5 mg/dL (ref 0.3–1.2)
BUN: 13 mg/dL (ref 6–20)
CALCIUM: 9.4 mg/dL (ref 8.9–10.3)
CO2: 23 mmol/L (ref 22–32)
CREATININE: 0.73 mg/dL (ref 0.44–1.00)
Chloride: 106 mmol/L (ref 98–111)
GFR, Est AFR Am: >60 mL/min (ref >60)
GFR, Estimated: >60 mL/min (ref >60)
GLUCOSE: 149 mg/dL — AB (ref 70–99)
Potassium: 2.9 mmol/L — CL (ref 3.5–5.1)
SODIUM: 141 mmol/L (ref 135–145)
Total Protein: 7.3 g/dL (ref 6.5–8.1)

## 2018-03-30 LAB — CBC WITH DIFFERENTIAL (CANCER CENTER ONLY)
BASOS PCT: 0 %
Basophils Absolute: 0 10*3/uL (ref 0.0–0.1)
EOS ABS: 0 10*3/uL (ref 0.0–0.5)
Eosinophils Relative: 0 %
HCT: 39.6 % (ref 34.8–46.6)
Hemoglobin: 13.9 g/dL (ref 11.6–15.9)
Lymphocytes Relative: 6 %
Lymphs Abs: 0.9 10*3/uL (ref 0.9–3.3)
MCH: 31.9 pg (ref 25.1–34.0)
MCHC: 35.1 g/dL (ref 31.5–36.0)
MCV: 90.8 fL (ref 79.5–101.0)
MONO ABS: 0.4 10*3/uL (ref 0.1–0.9)
MONOS PCT: 3 %
Neutro Abs: 11.9 10*3/uL — ABNORMAL HIGH (ref 1.5–6.5)
Neutrophils Relative %: 91 %
Platelet Count: 236 10*3/uL (ref 145–400)
RBC: 4.36 MIL/uL (ref 3.70–5.45)
RDW: 12.3 % (ref 11.2–14.5)
WBC Count: 13.2 10*3/uL — ABNORMAL HIGH (ref 3.9–10.3)

## 2018-03-30 MED ORDER — SODIUM CHLORIDE 0.9 % IV SOLN
840.0000 mg | Freq: Once | INTRAVENOUS | Status: AC
  Administered 2018-03-30: 840 mg via INTRAVENOUS
  Filled 2018-03-30: qty 28

## 2018-03-30 MED ORDER — SODIUM CHLORIDE 0.9 % IV SOLN
Freq: Once | INTRAVENOUS | Status: AC
  Administered 2018-03-30: 17:00:00 via INTRAVENOUS
  Filled 2018-03-30: qty 5

## 2018-03-30 MED ORDER — POTASSIUM CHLORIDE CRYS ER 20 MEQ PO TBCR
EXTENDED_RELEASE_TABLET | ORAL | Status: AC
  Filled 2018-03-30: qty 1

## 2018-03-30 MED ORDER — SODIUM CHLORIDE 0.9 % IV SOLN
740.0000 mg | Freq: Once | INTRAVENOUS | Status: AC
  Administered 2018-03-30: 740 mg via INTRAVENOUS
  Filled 2018-03-30: qty 74

## 2018-03-30 MED ORDER — ACETAMINOPHEN 325 MG PO TABS
ORAL_TABLET | ORAL | Status: AC
  Filled 2018-03-30: qty 2

## 2018-03-30 MED ORDER — SODIUM CHLORIDE 0.9 % IV SOLN: 740.4000 mg | Freq: Once | INTRAVENOUS | Status: DC

## 2018-03-30 MED ORDER — PALONOSETRON HCL INJECTION 0.25 MG/5ML
0.2500 mg | Freq: Once | INTRAVENOUS | Status: AC
  Administered 2018-03-30: 0.25 mg via INTRAVENOUS

## 2018-03-30 MED ORDER — PALONOSETRON HCL INJECTION 0.25 MG/5ML
INTRAVENOUS | Status: AC
  Filled 2018-03-30: qty 5

## 2018-03-30 MED ORDER — POTASSIUM CHLORIDE CRYS ER 20 MEQ PO TBCR
20.0000 meq | EXTENDED_RELEASE_TABLET | Freq: Once | ORAL | Status: AC
  Administered 2018-03-30: 20 meq via ORAL

## 2018-03-30 MED ORDER — ACETAMINOPHEN 325 MG PO TABS
650.0000 mg | ORAL_TABLET | Freq: Once | ORAL | Status: AC
  Administered 2018-03-30: 650 mg via ORAL

## 2018-03-30 MED ORDER — POTASSIUM CHLORIDE 10 MEQ/100ML IV SOLN
10.0000 meq | Freq: Once | INTRAVENOUS | Status: AC
  Administered 2018-03-30: 10 meq via INTRAVENOUS
  Filled 2018-03-30: qty 100

## 2018-03-30 MED ORDER — TRASTUZUMAB CHEMO 150 MG IV SOLR
600.0000 mg | Freq: Once | INTRAVENOUS | Status: AC
  Administered 2018-03-30: 600 mg via INTRAVENOUS
  Filled 2018-03-30: qty 28.57

## 2018-03-30 MED ORDER — POTASSIUM CHLORIDE 10 MEQ/100ML IV SOLN
10.0000 meq | INTRAVENOUS | Status: AC
  Administered 2018-03-30: 10 meq via INTRAVENOUS
  Filled 2018-03-30: qty 100

## 2018-03-30 MED ORDER — DIPHENHYDRAMINE HCL 25 MG PO CAPS
ORAL_CAPSULE | ORAL | Status: AC
  Filled 2018-03-30: qty 2

## 2018-03-30 MED ORDER — SODIUM CHLORIDE 0.9 % IV SOLN
Freq: Once | INTRAVENOUS | Status: AC
  Administered 2018-03-30: 09:00:00 via INTRAVENOUS
  Filled 2018-03-30: qty 250

## 2018-03-30 MED ORDER — SODIUM CHLORIDE 0.9 % IV SOLN
75.0000 mg/m2 | Freq: Once | INTRAVENOUS | Status: AC
  Administered 2018-03-30: 140 mg via INTRAVENOUS
  Filled 2018-03-30: qty 14

## 2018-03-30 MED ORDER — DIPHENHYDRAMINE HCL 25 MG PO CAPS
50.0000 mg | ORAL_CAPSULE | Freq: Once | ORAL | Status: AC
  Administered 2018-03-30: 50 mg via ORAL

## 2018-03-30 NOTE — Progress Notes (Signed)
Informed Claire Cruz RN Infusion of orders for IV potassium and PO during her visit, instruct patient to increase potassium to twice daily.

## 2018-03-30 NOTE — Patient Instructions (Signed)
[facility] Discharge Instructions for Patients Receiving Chemotherapy  Today you received the following chemotherapy agents Taxol, Carbo, Herceptin, Perjeta  To help prevent nausea and vomiting after your treatment, we encourage you to take your nausea medication as prescribed.   If you develop nausea and vomiting that is not controlled by your nausea medication, call the clinic.   BELOW ARE SYMPTOMS THAT SHOULD BE REPORTED IMMEDIATELY:  *FEVER GREATER THAN 100.5 F  *CHILLS WITH OR WITHOUT FEVER  NAUSEA AND VOMITING THAT IS NOT CONTROLLED WITH YOUR NAUSEA MEDICATION  *UNUSUAL SHORTNESS OF BREATH  *UNUSUAL BRUISING OR BLEEDING  TENDERNESS IN MOUTH AND THROAT WITH OR WITHOUT PRESENCE OF ULCERS  *URINARY PROBLEMS  *BOWEL PROBLEMS  UNUSUAL RASH Items with * indicate a potential emergency and should be followed up as soon as possible.  Feel free to call the clinic should you have any questions or concerns. The clinic phone number is [phone].  Please show the [facility] at check-in to the Emergency Department and triage nurse.  (Herceptin) Trastuzumab injection for infusion What is this medicine? TRASTUZUMAB (tras TOO zoo mab) is a monoclonal antibody. It is used to treat breast cancer and stomach cancer. This medicine may be used for other purposes; ask your health care provider or pharmacist if you have questions. COMMON BRAND NAME(S): Herceptin What should I tell my health care provider before I take this medicine? They need to know if you have any of these conditions: -heart disease -heart failure -lung or breathing disease, like asthma -an unusual or allergic reaction to trastuzumab, benzyl alcohol, or other medications, foods, dyes, or preservatives -pregnant or trying to get pregnant -breast-feeding How should I use this medicine? This drug is given as an infusion into a vein. It is administered in a hospital or clinic by a specially  trained health care professional. Talk to your pediatrician regarding the use of this medicine in children. This medicine is not approved for use in children. Overdosage: If you think you have taken too much of this medicine contact a poison control center or emergency room at once. NOTE: This medicine is only for you. Do not share this medicine with others. What if I miss a dose? It is important not to miss a dose. Call your doctor or health care professional if you are unable to keep an appointment. What may interact with this medicine? This medicine may interact with the following medications: -certain types of chemotherapy, such as daunorubicin, doxorubicin, epirubicin, and idarubicin This list may not describe all possible interactions. Give your health care provider a list of all the medicines, herbs, non-prescription drugs, or dietary supplements you use. Also tell them if you smoke, drink alcohol, or use illegal drugs. Some items may interact with your medicine. What should I watch for while using this medicine? Visit your doctor for checks on your progress. Report any side effects. Continue your course of treatment even though you feel ill unless your doctor tells you to stop. Call your doctor or health care professional for advice if you get a fever, chills or sore throat, or other symptoms of a cold or flu. Do not treat yourself. Try to avoid being around people who are sick. You may experience fever, chills and shaking during your first infusion. These effects are usually mild and can be treated with other medicines. Report any side effects during the infusion to your health care professional. Fever and chills usually do not happen with later infusions. Do  not become pregnant while taking this medicine or for 7 months after stopping it. Women should inform their doctor if they wish to become pregnant or think they might be pregnant. Women of child-bearing potential will need to have a  negative pregnancy test before starting this medicine. There is a potential for serious side effects to an unborn child. Talk to your health care professional or pharmacist for more information. Do not breast-feed an infant while taking this medicine or for 7 months after stopping it. Women must use effective birth control with this medicine. What side effects may I notice from receiving this medicine? Side effects that you should report to your doctor or health care professional as soon as possible: -allergic reactions like skin rash, itching or hives, swelling of the face, lips, or tongue -chest pain or palpitations -cough -dizziness -feeling faint or lightheaded, falls -fever -general ill feeling or flu-like symptoms -signs of worsening heart failure like breathing problems; swelling in your legs and feet -unusually weak or tired Side effects that usually do not require medical attention (report to your doctor or health care professional if they continue or are bothersome): -bone pain -changes in taste -diarrhea -joint pain -nausea/vomiting -weight loss This list may not describe all possible side effects. Call your doctor for medical advice about side effects. You may report side effects to FDA at 1-800-FDA-1088. Where should I keep my medicine? This drug is given in a hospital or clinic and will not be stored at home. NOTE: This sheet is a summary. It may not cover all possible information. If you have questions about this medicine, talk to your doctor, pharmacist, or health care provider.  2018 Elsevier/Gold Standard (2016-07-08 14:37:52)  (Perjeta) Pertuzumab injection What is this medicine? PERTUZUMAB (per TOOZ ue mab) is a monoclonal antibody. It is used to treat breast cancer. This medicine may be used for other purposes; ask your health care provider or pharmacist if you have questions. COMMON BRAND NAME(S): PERJETA What should I tell my health care provider before I take  this medicine? They need to know if you have any of these conditions: -heart disease -heart failure -high blood pressure -history of irregular heart beat -recent or ongoing radiation therapy -an unusual or allergic reaction to pertuzumab, other medicines, foods, dyes, or preservatives -pregnant or trying to get pregnant -breast-feeding How should I use this medicine? This medicine is for infusion into a vein. It is given by a health care professional in a hospital or clinic setting. Talk to your pediatrician regarding the use of this medicine in children. Special care may be needed. Overdosage: If you think you have taken too much of this medicine contact a poison control center or emergency room at once. NOTE: This medicine is only for you. Do not share this medicine with others. What if I miss a dose? It is important not to miss your dose. Call your doctor or health care professional if you are unable to keep an appointment. What may interact with this medicine? Interactions are not expected. Give your health care provider a list of all the medicines, herbs, non-prescription drugs, or dietary supplements you use. Also tell them if you smoke, drink alcohol, or use illegal drugs. Some items may interact with your medicine. This list may not describe all possible interactions. Give your health care provider a list of all the medicines, herbs, non-prescription drugs, or dietary supplements you use. Also tell them if you smoke, drink alcohol, or use illegal drugs. Some items  may interact with your medicine. What should I watch for while using this medicine? Your condition will be monitored carefully while you are receiving this medicine. Report any side effects. Continue your course of treatment even though you feel ill unless your doctor tells you to stop. Do not become pregnant while taking this medicine or for 7 months after stopping it. Women should inform their doctor if they wish to become  pregnant or think they might be pregnant. Women of child-bearing potential will need to have a negative pregnancy test before starting this medicine. There is a potential for serious side effects to an unborn child. Talk to your health care professional or pharmacist for more information. Do not breast-feed an infant while taking this medicine or for 7 months after stopping it. Women must use effective birth control with this medicine. Call your doctor or health care professional for advice if you get a fever, chills or sore throat, or other symptoms of a cold or flu. Do not treat yourself. Try to avoid being around people who are sick. You may experience fever, chills, and headache during the infusion. Report any side effects during the infusion to your health care professional. What side effects may I notice from receiving this medicine? Side effects that you should report to your doctor or health care professional as soon as possible: -breathing problems -chest pain or palpitations -dizziness -feeling faint or lightheaded -fever or chills -skin rash, itching or hives -sore throat -swelling of the face, lips, or tongue -swelling of the legs or ankles -unusually weak or tired Side effects that usually do not require medical attention (report to your doctor or health care professional if they continue or are bothersome): -diarrhea -hair loss -nausea, vomiting -tiredness This list may not describe all possible side effects. Call your doctor for medical advice about side effects. You may report side effects to FDA at 1-800-FDA-1088. Where should I keep my medicine? This drug is given in a hospital or clinic and will not be stored at home. NOTE: This sheet is a summary. It may not cover all possible information. If you have questions about this medicine, talk to your doctor, pharmacist, or health care provider.  2018 Elsevier/Gold Standard (2015-08-16 12:08:50)  (Taxol) Paclitaxel  injection What is this medicine? PACLITAXEL (PAK li TAX el) is a chemotherapy drug. It targets fast dividing cells, like cancer cells, and causes these cells to die. This medicine is used to treat ovarian cancer, breast cancer, and other cancers. This medicine may be used for other purposes; ask your health care provider or pharmacist if you have questions. COMMON BRAND NAME(S): Onxol, Taxol What should I tell my health care provider before I take this medicine? They need to know if you have any of these conditions: -blood disorders -irregular heartbeat -infection (especially a virus infection such as chickenpox, cold sores, or herpes) -liver disease -previous or ongoing radiation therapy -an unusual or allergic reaction to paclitaxel, alcohol, polyoxyethylated castor oil, other chemotherapy agents, other medicines, foods, dyes, or preservatives -pregnant or trying to get pregnant -breast-feeding How should I use this medicine? This drug is given as an infusion into a vein. It is administered in a hospital or clinic by a specially trained health care professional. Talk to your pediatrician regarding the use of this medicine in children. Special care may be needed. Overdosage: If you think you have taken too much of this medicine contact a poison control center or emergency room at once. NOTE: This medicine is  only for you. Do not share this medicine with others. What if I miss a dose? It is important not to miss your dose. Call your doctor or health care professional if you are unable to keep an appointment. What may interact with this medicine? Do not take this medicine with any of the following medications: -disulfiram -metronidazole This medicine may also interact with the following medications: -cyclosporine -diazepam -ketoconazole -medicines to increase blood counts like filgrastim, pegfilgrastim, sargramostim -other chemotherapy drugs like cisplatin, doxorubicin, epirubicin,  etoposide, teniposide, vincristine -quinidine -testosterone -vaccines -verapamil Talk to your doctor or health care professional before taking any of these medicines: -acetaminophen -aspirin -ibuprofen -ketoprofen -naproxen This list may not describe all possible interactions. Give your health care provider a list of all the medicines, herbs, non-prescription drugs, or dietary supplements you use. Also tell them if you smoke, drink alcohol, or use illegal drugs. Some items may interact with your medicine. What should I watch for while using this medicine? Your condition will be monitored carefully while you are receiving this medicine. You will need important blood work done while you are taking this medicine. This medicine can cause serious allergic reactions. To reduce your risk you will need to take other medicine(s) before treatment with this medicine. If you experience allergic reactions like skin rash, itching or hives, swelling of the face, lips, or tongue, tell your doctor or health care professional right away. In some cases, you may be given additional medicines to help with side effects. Follow all directions for their use. This drug may make you feel generally unwell. This is not uncommon, as chemotherapy can affect healthy cells as well as cancer cells. Report any side effects. Continue your course of treatment even though you feel ill unless your doctor tells you to stop. Call your doctor or health care professional for advice if you get a fever, chills or sore throat, or other symptoms of a cold or flu. Do not treat yourself. This drug decreases your body's ability to fight infections. Try to avoid being around people who are sick. This medicine may increase your risk to bruise or bleed. Call your doctor or health care professional if you notice any unusual bleeding. Be careful brushing and flossing your teeth or using a toothpick because you may get an infection or bleed more  easily. If you have any dental work done, tell your dentist you are receiving this medicine. Avoid taking products that contain aspirin, acetaminophen, ibuprofen, naproxen, or ketoprofen unless instructed by your doctor. These medicines may hide a fever. Do not become pregnant while taking this medicine. Women should inform their doctor if they wish to become pregnant or think they might be pregnant. There is a potential for serious side effects to an unborn child. Talk to your health care professional or pharmacist for more information. Do not breast-feed an infant while taking this medicine. Men are advised not to father a child while receiving this medicine. This product may contain alcohol. Ask your pharmacist or healthcare provider if this medicine contains alcohol. Be sure to tell all healthcare providers you are taking this medicine. Certain medicines, like metronidazole and disulfiram, can cause an unpleasant reaction when taken with alcohol. The reaction includes flushing, headache, nausea, vomiting, sweating, and increased thirst. The reaction can last from 30 minutes to several hours. What side effects may I notice from receiving this medicine? Side effects that you should report to your doctor or health care professional as soon as possible: -allergic reactions like  skin rash, itching or hives, swelling of the face, lips, or tongue -low blood counts - This drug may decrease the number of white blood cells, red blood cells and platelets. You may be at increased risk for infections and bleeding. -signs of infection - fever or chills, cough, sore throat, pain or difficulty passing urine -signs of decreased platelets or bleeding - bruising, pinpoint red spots on the skin, black, tarry stools, nosebleeds -signs of decreased red blood cells - unusually weak or tired, fainting spells, lightheadedness -breathing problems -chest pain -high or low blood pressure -mouth sores -nausea and  vomiting -pain, swelling, redness or irritation at the injection site -pain, tingling, numbness in the hands or feet -slow or irregular heartbeat -swelling of the ankle, feet, hands Side effects that usually do not require medical attention (report to your doctor or health care professional if they continue or are bothersome): -bone pain -complete hair loss including hair on your head, underarms, pubic hair, eyebrows, and eyelashes -changes in the color of fingernails -diarrhea -loosening of the fingernails -loss of appetite -muscle or joint pain -red flush to skin -sweating This list may not describe all possible side effects. Call your doctor for medical advice about side effects. You may report side effects to FDA at 1-800-FDA-1088. Where should I keep my medicine? This drug is given in a hospital or clinic and will not be stored at home. NOTE: This sheet is a summary. It may not cover all possible information. If you have questions about this medicine, talk to your doctor, pharmacist, or health care provider.  2018 Elsevier/Gold Standard (2015-05-15 19:58:00)  (Carbo) Carboplatin injection What is this medicine? CARBOPLATIN (KAR boe pla tin) is a chemotherapy drug. It targets fast dividing cells, like cancer cells, and causes these cells to die. This medicine is used to treat ovarian cancer and many other cancers. This medicine may be used for other purposes; ask your health care provider or pharmacist if you have questions. COMMON BRAND NAME(S): Paraplatin What should I tell my health care provider before I take this medicine? They need to know if you have any of these conditions: -blood disorders -hearing problems -kidney disease -recent or ongoing radiation therapy -an unusual or allergic reaction to carboplatin, cisplatin, other chemotherapy, other medicines, foods, dyes, or preservatives -pregnant or trying to get pregnant -breast-feeding How should I use this  medicine? This drug is usually given as an infusion into a vein. It is administered in a hospital or clinic by a specially trained health care professional. Talk to your pediatrician regarding the use of this medicine in children. Special care may be needed. Overdosage: If you think you have taken too much of this medicine contact a poison control center or emergency room at once. NOTE: This medicine is only for you. Do not share this medicine with others. What if I miss a dose? It is important not to miss a dose. Call your doctor or health care professional if you are unable to keep an appointment. What may interact with this medicine? -medicines for seizures -medicines to increase blood counts like filgrastim, pegfilgrastim, sargramostim -some antibiotics like amikacin, gentamicin, neomycin, streptomycin, tobramycin -vaccines Talk to your doctor or health care professional before taking any of these medicines: -acetaminophen -aspirin -ibuprofen -ketoprofen -naproxen This list may not describe all possible interactions. Give your health care provider a list of all the medicines, herbs, non-prescription drugs, or dietary supplements you use. Also tell them if you smoke, drink alcohol, or use illegal drugs.  Some items may interact with your medicine. What should I watch for while using this medicine? Your condition will be monitored carefully while you are receiving this medicine. You will need important blood work done while you are taking this medicine. This drug may make you feel generally unwell. This is not uncommon, as chemotherapy can affect healthy cells as well as cancer cells. Report any side effects. Continue your course of treatment even though you feel ill unless your doctor tells you to stop. In some cases, you may be given additional medicines to help with side effects. Follow all directions for their use. Call your doctor or health care professional for advice if you get a  fever, chills or sore throat, or other symptoms of a cold or flu. Do not treat yourself. This drug decreases your body's ability to fight infections. Try to avoid being around people who are sick. This medicine may increase your risk to bruise or bleed. Call your doctor or health care professional if you notice any unusual bleeding. Be careful brushing and flossing your teeth or using a toothpick because you may get an infection or bleed more easily. If you have any dental work done, tell your dentist you are receiving this medicine. Avoid taking products that contain aspirin, acetaminophen, ibuprofen, naproxen, or ketoprofen unless instructed by your doctor. These medicines may hide a fever. Do not become pregnant while taking this medicine. Women should inform their doctor if they wish to become pregnant or think they might be pregnant. There is a potential for serious side effects to an unborn child. Talk to your health care professional or pharmacist for more information. Do not breast-feed an infant while taking this medicine. What side effects may I notice from receiving this medicine? Side effects that you should report to your doctor or health care professional as soon as possible: -allergic reactions like skin rash, itching or hives, swelling of the face, lips, or tongue -signs of infection - fever or chills, cough, sore throat, pain or difficulty passing urine -signs of decreased platelets or bleeding - bruising, pinpoint red spots on the skin, black, tarry stools, nosebleeds -signs of decreased red blood cells - unusually weak or tired, fainting spells, lightheadedness -breathing problems -changes in hearing -changes in vision -chest pain -high blood pressure -low blood counts - This drug may decrease the number of white blood cells, red blood cells and platelets. You may be at increased risk for infections and bleeding. -nausea and vomiting -pain, swelling, redness or irritation at the  injection site -pain, tingling, numbness in the hands or feet -problems with balance, talking, walking -trouble passing urine or change in the amount of urine Side effects that usually do not require medical attention (report to your doctor or health care professional if they continue or are bothersome): -hair loss -loss of appetite -metallic taste in the mouth or changes in taste This list may not describe all possible side effects. Call your doctor for medical advice about side effects. You may report side effects to FDA at 1-800-FDA-1088. Where should I keep my medicine? This drug is given in a hospital or clinic and will not be stored at home. NOTE: This sheet is a summary. It may not cover all possible information. If you have questions about this medicine, talk to your doctor, pharmacist, or health care provider.  2018 Elsevier/Gold Standard (2007-10-19 14:38:05)

## 2018-04-01 ENCOUNTER — Inpatient Hospital Stay: Payer: BLUE CROSS/BLUE SHIELD

## 2018-04-01 VITALS — BP 148/80 | HR 73 | Temp 98.5°F | Resp 18

## 2018-04-01 DIAGNOSIS — C50211 Malignant neoplasm of upper-inner quadrant of right female breast: Secondary | ICD-10-CM

## 2018-04-01 DIAGNOSIS — Z170 Estrogen receptor positive status [ER+]: Secondary | ICD-10-CM

## 2018-04-01 DIAGNOSIS — Z5111 Encounter for antineoplastic chemotherapy: Secondary | ICD-10-CM | POA: Diagnosis not present

## 2018-04-01 DIAGNOSIS — Z5112 Encounter for antineoplastic immunotherapy: Secondary | ICD-10-CM | POA: Diagnosis not present

## 2018-04-01 DIAGNOSIS — Z5189 Encounter for other specified aftercare: Secondary | ICD-10-CM | POA: Diagnosis not present

## 2018-04-01 MED ORDER — PEGFILGRASTIM-CBQV 6 MG/0.6ML ~~LOC~~ SOSY
PREFILLED_SYRINGE | SUBCUTANEOUS | Status: AC
  Filled 2018-04-01: qty 0.6

## 2018-04-01 MED ORDER — PEGFILGRASTIM-CBQV 6 MG/0.6ML ~~LOC~~ SOSY
6.0000 mg | PREFILLED_SYRINGE | Freq: Once | SUBCUTANEOUS | Status: AC
  Administered 2018-04-01: 6 mg via SUBCUTANEOUS

## 2018-04-01 NOTE — Progress Notes (Signed)
Pt.. tolerated injection well, No further problems or concerns noted. 

## 2018-04-01 NOTE — Patient Instructions (Signed)
Pegfilgrastim injection What is this medicine? PEGFILGRASTIM (PEG fil gra stim) is a long-acting granulocyte colony-stimulating factor that stimulates the growth of neutrophils, a type of white blood cell important in the body's fight against infection. It is used to reduce the incidence of fever and infection in patients with certain types of cancer who are receiving chemotherapy that affects the bone marrow, and to increase survival after being exposed to high doses of radiation. This medicine may be used for other purposes; ask your health care provider or pharmacist if you have questions. COMMON BRAND NAME(S): Neulasta What should I tell my health care provider before I take this medicine? They need to know if you have any of these conditions: -kidney disease -latex allergy -ongoing radiation therapy -sickle cell disease -skin reactions to acrylic adhesives (On-Body Injector only) -an unusual or allergic reaction to pegfilgrastim, filgrastim, other medicines, foods, dyes, or preservatives -pregnant or trying to get pregnant -breast-feeding How should I use this medicine? This medicine is for injection under the skin. If you get this medicine at home, you will be taught how to prepare and give the pre-filled syringe or how to use the On-body Injector. Refer to the patient Instructions for Use for detailed instructions. Use exactly as directed. Tell your healthcare provider immediately if you suspect that the On-body Injector may not have performed as intended or if you suspect the use of the On-body Injector resulted in a missed or partial dose. It is important that you put your used needles and syringes in a special sharps container. Do not put them in a trash can. If you do not have a sharps container, call your pharmacist or healthcare provider to get one. Talk to your pediatrician regarding the use of this medicine in children. While this drug may be prescribed for selected conditions,  precautions do apply. Overdosage: If you think you have taken too much of this medicine contact a poison control center or emergency room at once. NOTE: This medicine is only for you. Do not share this medicine with others. What if I miss a dose? It is important not to miss your dose. Call your doctor or health care professional if you miss your dose. If you miss a dose due to an On-body Injector failure or leakage, a new dose should be administered as soon as possible using a single prefilled syringe for manual use. What may interact with this medicine? Interactions have not been studied. Give your health care provider a list of all the medicines, herbs, non-prescription drugs, or dietary supplements you use. Also tell them if you smoke, drink alcohol, or use illegal drugs. Some items may interact with your medicine. This list may not describe all possible interactions. Give your health care provider a list of all the medicines, herbs, non-prescription drugs, or dietary supplements you use. Also tell them if you smoke, drink alcohol, or use illegal drugs. Some items may interact with your medicine. What should I watch for while using this medicine? You may need blood work done while you are taking this medicine. If you are going to need a MRI, CT scan, or other procedure, tell your doctor that you are using this medicine (On-Body Injector only). What side effects may I notice from receiving this medicine? Side effects that you should report to your doctor or health care professional as soon as possible: -allergic reactions like skin rash, itching or hives, swelling of the face, lips, or tongue -dizziness -fever -pain, redness, or irritation at site   where injected -pinpoint red spots on the skin -red or dark-brown urine -shortness of breath or breathing problems -stomach or side pain, or pain at the shoulder -swelling -tiredness -trouble passing urine or change in the amount of urine Side  effects that usually do not require medical attention (report to your doctor or health care professional if they continue or are bothersome): -bone pain -muscle pain This list may not describe all possible side effects. Call your doctor for medical advice about side effects. You may report side effects to FDA at 1-800-FDA-1088. Where should I keep my medicine? Keep out of the reach of children. Store pre-filled syringes in a refrigerator between 2 and 8 degrees C (36 and 46 degrees F). Do not freeze. Keep in carton to protect from light. Throw away this medicine if it is left out of the refrigerator for more than 48 hours. Throw away any unused medicine after the expiration date. NOTE: This sheet is a summary. It may not cover all possible information. If you have questions about this medicine, talk to your doctor, pharmacist, or health care provider.  2018 [employer] (2016-07-10 12:58:03)  

## 2018-04-02 ENCOUNTER — Telehealth: Payer: Self-pay | Admitting: *Deleted

## 2018-04-02 ENCOUNTER — Other Ambulatory Visit: Payer: Self-pay | Admitting: Hematology

## 2018-04-02 MED ORDER — HYDROCODONE-ACETAMINOPHEN 5-325 MG PO TABS: 1.0000 | ORAL_TABLET | Freq: Four times a day (QID) | ORAL | 0 refills | Status: DC | PRN

## 2018-04-02 NOTE — Telephone Encounter (Signed)
Spoke with patient she is not taking Claritin but will start.  She would like something stronger for pain that she could take at night to sleep Send into [place], [place]

## 2018-04-02 NOTE — Telephone Encounter (Signed)
This is probably related to the neulasta injection, Malachy Mood could you call her back and make sure she is taking Claritin, OK to use tylenol or ibuprofen,  and let me know if she needs other pain medication. Thanks   Truitt Merle MD

## 2018-04-02 NOTE — Telephone Encounter (Signed)
Claire Guzman [phone]) called reporting pain.  "Pain started Tuesday night to tailbone, extreme lower back and spine, down legs to front to my knees.  Hurts more with movement.  I'm walking kind of wobbly.  Trying to go to work now so I took Tylenol at 8:00 am.  Tramadol last night at bedtime and Xanax because Tramadol affects my sleep.  I did not take the Dexamethasone because it gives me the shakes and turns me red."  Expecting return call with any provider orders or instructions.  Confirmed preferred pharmacy as [facility], [id number].

## 2018-04-05 ENCOUNTER — Inpatient Hospital Stay: Payer: BLUE CROSS/BLUE SHIELD

## 2018-04-05 ENCOUNTER — Telehealth: Payer: Self-pay

## 2018-04-05 ENCOUNTER — Encounter: Payer: Self-pay | Admitting: Hematology

## 2018-04-05 ENCOUNTER — Other Ambulatory Visit: Payer: Self-pay

## 2018-04-05 ENCOUNTER — Encounter (HOSPITAL_BASED_OUTPATIENT_CLINIC_OR_DEPARTMENT_OTHER): Payer: Self-pay

## 2018-04-05 ENCOUNTER — Inpatient Hospital Stay (HOSPITAL_BASED_OUTPATIENT_CLINIC_OR_DEPARTMENT_OTHER): Payer: BLUE CROSS/BLUE SHIELD | Admitting: Hematology

## 2018-04-05 VITALS — BP 151/79 | HR 73 | Temp 98.7°F | Resp 16 | Ht 64.0 in | Wt 160.6 lb

## 2018-04-05 DIAGNOSIS — R918 Other nonspecific abnormal finding of lung field: Secondary | ICD-10-CM

## 2018-04-05 DIAGNOSIS — C50211 Malignant neoplasm of upper-inner quadrant of right female breast: Secondary | ICD-10-CM

## 2018-04-05 DIAGNOSIS — M25552 Pain in left hip: Secondary | ICD-10-CM

## 2018-04-05 DIAGNOSIS — Z170 Estrogen receptor positive status [ER+]: Secondary | ICD-10-CM

## 2018-04-05 DIAGNOSIS — F419 Anxiety disorder, unspecified: Secondary | ICD-10-CM

## 2018-04-05 DIAGNOSIS — Z5112 Encounter for antineoplastic immunotherapy: Secondary | ICD-10-CM | POA: Diagnosis not present

## 2018-04-05 DIAGNOSIS — R1310 Dysphagia, unspecified: Secondary | ICD-10-CM

## 2018-04-05 DIAGNOSIS — I10 Essential (primary) hypertension: Secondary | ICD-10-CM

## 2018-04-05 DIAGNOSIS — Z5111 Encounter for antineoplastic chemotherapy: Secondary | ICD-10-CM | POA: Diagnosis not present

## 2018-04-05 DIAGNOSIS — M25551 Pain in right hip: Secondary | ICD-10-CM

## 2018-04-05 DIAGNOSIS — R21 Rash and other nonspecific skin eruption: Secondary | ICD-10-CM

## 2018-04-05 DIAGNOSIS — R070 Pain in throat: Secondary | ICD-10-CM

## 2018-04-05 DIAGNOSIS — Z5189 Encounter for other specified aftercare: Secondary | ICD-10-CM | POA: Diagnosis not present

## 2018-04-05 LAB — CBC WITH DIFFERENTIAL (CANCER CENTER ONLY)
Basophils Absolute: 0 10*3/uL (ref 0.0–0.1)
Basophils Relative: 0 %
EOS PCT: 3 %
Eosinophils Absolute: 0.1 10*3/uL (ref 0.0–0.5)
HCT: 37.9 % (ref 34.8–46.6)
HEMOGLOBIN: 13.5 g/dL (ref 11.6–15.9)
LYMPHS ABS: 1.7 10*3/uL (ref 0.9–3.3)
LYMPHS PCT: 64 %
MCH: 32 pg (ref 25.1–34.0)
MCHC: 35.6 g/dL (ref 31.5–36.0)
MCV: 89.8 fL (ref 79.5–101.0)
Monocytes Absolute: 0.3 10*3/uL (ref 0.1–0.9)
Monocytes Relative: 13 %
Neutro Abs: 0.5 10*3/uL — ABNORMAL LOW (ref 1.5–6.5)
Neutrophils Relative %: 20 %
PLATELETS: 164 10*3/uL (ref 145–400)
RBC: 4.22 MIL/uL (ref 3.70–5.45)
RDW: 11.8 % (ref 11.2–14.5)
WBC: 2.7 10*3/uL — AB (ref 3.9–10.3)

## 2018-04-05 LAB — CMP (CANCER CENTER ONLY)
ALT: 17 U/L (ref 0–44)
AST: 17 U/L (ref 15–41)
Albumin: 3.9 g/dL (ref 3.5–5.0)
Alkaline Phosphatase: 81 U/L (ref 38–126)
Anion gap: 10 (ref 5–15)
BUN: 10 mg/dL (ref 6–20)
CHLORIDE: 100 mmol/L (ref 98–111)
CO2: 30 mmol/L (ref 22–32)
Calcium: 9.5 mg/dL (ref 8.9–10.3)
Creatinine: 0.7 mg/dL (ref 0.44–1.00)
GFR, Est AFR Am: >60 mL/min (ref >60)
Glucose, Bld: 101 mg/dL — ABNORMAL HIGH (ref 70–99)
Potassium: 3.3 mmol/L — ABNORMAL LOW (ref 3.5–5.1)
SODIUM: 140 mmol/L (ref 135–145)
Total Bilirubin: 0.7 mg/dL (ref 0.3–1.2)
Total Protein: 6.9 g/dL (ref 6.5–8.1)

## 2018-04-05 MED ORDER — ALPRAZOLAM 0.25 MG PO TABS: 0.2500 mg | ORAL_TABLET | Freq: Every evening | ORAL | 0 refills | Status: DC | PRN

## 2018-04-05 MED ORDER — TRAMADOL HCL 50 MG PO TABS: 50.0000 mg | ORAL_TABLET | Freq: Two times a day (BID) | ORAL | 0 refills | Status: DC | PRN

## 2018-04-05 NOTE — Telephone Encounter (Signed)
Spoke with patient per Dr. Burr Medico cancelling breast biopsy Dr. Dalbert Batman in agreement.  [facility] to cancel biopsy for 04/07/18.

## 2018-04-05 NOTE — Progress Notes (Signed)
[facility]   [phone] Fax:([phone]) [phone]   Clinic Follow Up Note   Patient Care Team: Lujean Amel, MD as PCP - General (Family Medicine) Fanny Skates, MD as Consulting Physician (General Surgery) Truitt Merle, MD as Consulting Physician (Hematology) Eppie Gibson, MD as Attending Physician (Radiation Oncology)   Date of Service:  04/05/2018  CHIEF COMPLAINTS:  F/u for stage IB breast cancer  Oncology History   Cancer Staging Malignant neoplasm of upper-inner quadrant of right breast in female, estrogen receptor positive ([place]) Staging form: Breast, AJCC 8th Edition - Clinical stage from 03/08/2018: Stage IB (cT2, cN1, cM0, G3, ER+, PR+, HER2+) - Signed by Truitt Merle, MD on 03/16/2018       Malignant neoplasm of upper-inner quadrant of right breast in female, estrogen receptor positive ([place])   02/24/2018 Mammogram    screening mammogram on 02/24/2018 that was benign    03/04/2018 Mammogram    diagnostic mammogram on 03/04/2018 due to a palpable mass on her right breast. Diagnostic mammogram, breast US revealed and biopsy revealed suspicious mass in the right breast at 2:30 at the palpable site of concern and one suspicious right axillary LN.    03/08/2018 Cancer Staging    Staging form: Breast, AJCC 8th Edition - Clinical stage from 03/08/2018: Stage IB (cT2, cN1, cM0, G3, ER+, PR+, HER2+) - Signed by Truitt Merle, MD on 03/16/2018    03/08/2018 Receptors her2    Her2 positive, ER 90% PR 5% and Ki67 60%    03/08/2018 Pathology Results    Diagnosis 1. Breast, right, needle core biopsy, 2:30 - INVASIVE DUCTAL CARCINOMA, GRADE 3. - LYMPHOVASCULAR SPACE INVOLVEMENT BY TUMOR. 2. Lymph node, needle/core biopsy, right axilla - METASTATIC CARCINOMA INVOLVING SCANT LYMPHOID TISSUE.    03/10/2018 Initial Diagnosis    Malignant neoplasm of upper-inner quadrant of right breast in female, estrogen receptor positive ([place])    03/20/2018 Imaging    MRI brain  03/20/18 IMPRESSION: No evidence of metastatic disease.  Normal appearance of brain. Slightly heterogeneous marrow pattern of the upper cervical spine, nonspecific but likely benign, especially in the setting of early stage disease.     03/24/2018 Echocardiogram    Baseline ECHO 03/24/18 Study Conclusions - Left ventricle: The cavity size was normal. Wall thickness was   increased in a pattern of mild LVH. Systolic function was normal.   The estimated ejection fraction was in the range of 60% to 65%.   Wall motion was normal; there were no regional wall motion   abnormalities. Doppler parameters are consistent with abnormal   left ventricular relaxation (grade 1 diastolic dysfunction). - Right ventricle: The cavity size was mildly dilated. Wall   thickness was normal.    03/25/2018 Imaging    MRI Breast B/l 03/25/18 IMPRESSION: 1. The patient's primary malignancy in the posterior right breast measures 3.2 x 2 x 2.6 cm with apparent invasion of the underlying pectoralis muscle. Numerous surrounding abnormal enhancing masses, consistent with satellite lesions, extend from 11 o'clock in the right breast into the inferolateral right breast with a total span of suspected disease measuring 4.5 x 4.6 x 6.4 cm in AP, transverse, and craniocaudal dimension. The suspected extent of disease involves the superior and inferolateral quadrants. The primary malignancy also extends just across midline into the superior medial quadrant. 2. No MRI evidence of malignancy in the left breast. 3. Known metastatic noted in the right axilla.    03/25/2018 Imaging    Whole body bone scan 03/25/18 IMPRESSION:  No scintigraphic evidence skeletal metastasis.    03/25/2018 Imaging    CT CAP W Contrast 03/25/18 IMPRESSION: 1. Mass within the deep aspect of the right breast along the pectoralis muscle compatible with primary breast malignancy. 2. There is suggestion of two additional possible masses within  the lateral aspect of the right breast versus nodular appearing breast tissue. Recommend dedicated evaluation with bilateral breast MRI. 3. Mildly thickened right axillary lymph node compatible with metastatic adenopathy, recently biopsied. 4. Multiple bilateral pulmonary nodules are indeterminate, potentially sequelae of prior infectious/inflammatory process. Recommend attention on follow-up. Consider follow-up CT in 6 months. 5. Portal venous gas is demonstrated within the left hepatic lobe. Additionally, there is wall thickening of the cecum and ascending colon with small amount of gas within the pericolonic vasculature. Constellation of findings is indeterminate in etiology however may be secondary to colitis at this location with considerations including infectious, inflammatory or ischemic etiologies. Correlate for recent procedure. If patient is not up-to-date for colonic screening, recommend further evaluation with colonoscopy to exclude the possibility of colonic mass within the cecum/ascending colon. 6. These results were called by telephone at the time of interpretation on 03/25/2018 at 9:31 am to Dr. Truitt Merle , who verbally acknowledged these results.    03/30/2018 -  Chemotherapy    Neoadjuvant TCHP every 3 weeks starting 03/30/18     HISTORY OF PRESENTING ILLNESS:  Claire Guzman 52 y.o. female is here because of newly diagnosed stage IB breast cancer. She had a screening mammogram on 02/24/2018 that was benign. She later had a diagnostic mammogram on 03/04/2018 due to a palpable mass on her right breast. Diagnostic mammogram, breast US revealed and biopsy revealed suspicious mass in the right breast at 2:30 at the palpable site of concern and one suspicious right axillary LN. Biopsy revealed invasive breast cancer with metastasis to one LN.  Today, she is here with her family members. She is feeling well. She complains of smelling smoke and ammonia at home. She denies having  sinus problems. She also complains of dizziness that's been there for some time. She denies headaches, or change in vision. She has tinnitus, that she thinks is getting worse.  She denies new breast symptoms. She denies pain or weight changes. No abdmonial pain or discomfort.  She has HTN for which she takes medications. She takes Effexor for depression and anxiety.   Grandfather had colon cancer. Father had prostate. Mother had melanoma. Uncles had colon cancer.  She works in Press [employer].    GYN HISTORY  Menarchal: 15 LMP: 02/15/2018, irregular Contraceptive: She is on Levonorgestrel-ethinyl estradiol. HRT: None GP: 1 child, age 41 at first delivery     CURRENT THERAPY  Neoadjuvant TCHP every 3 weeks starting 03/30/18   INTERVAL HISTORY  Claire Guzman is here for a follow up after first infusion of TCPH. She presents to the clinic today alone. She experienced bilateral hip pain that radiates to her legs after her first injection. The pain was very bad to the point where she was crying. She took Tramadol. She is also having a red rash on her back. Her scalp now is more sensitive. She denies being under the sun. She states that she uses EMLA cream.  She reports having throat pain and dysphagia. She is still able to eat and swallow, but it's becoming difficult. She complains of indigestion and abdominal heaviness. She gets constipated sometimes. No diarrhea. She feels dizzy in crowded places, but is not claustrophobic.  MEDICAL HISTORY:  Past Medical History:  Diagnosis Date  . Abdominal pain, unspecified site   . Anxiety state, unspecified   . Cancer [facility])    R breast cancer  . Contact dermatitis and other eczema, due to unspecified cause   . Essential hypertension, benign   . Family history of colon cancer   . Family history of melanoma   . Family history of prostate cancer   . GERD (gastroesophageal reflux disease)   . Hematuria, unspecified   . Hypertension   . Lower back  pain   . Mixed hyperlipidemia   . Rosacea   . Unspecified symptom associated with female genital organs     SURGICAL HISTORY: Past Surgical History:  Procedure Laterality Date  . CESAREAN SECTION    . DIAGNOSTIC LAPAROSCOPY    . LAPAROSCOPIC APPENDECTOMY    . UTERINE FIBROID SURGERY      SOCIAL HISTORY: Social History   Socioeconomic History  . Marital status: Married    Spouse name: Not on file  . Number of children: Not on file  . Years of education: Not on file  . Highest education level: Not on file  Occupational History  . Not on file  Social Needs  . Financial resource strain: Not on file  . Food insecurity:    Worry: Not on file    Inability: Not on file  . Transportation needs:    Medical: Not on file    Non-medical: Not on file  Tobacco Use  . Smoking status: Never Smoker  . Smokeless tobacco: Never Used  Substance and Sexual Activity  . Alcohol use: No  . Drug use: Never  . Sexual activity: Not on file  Lifestyle  . Physical activity:    Days per week: Not on file    Minutes per session: Not on file  . Stress: Not on file  Relationships  . Social connections:    Talks on phone: Not on file    Gets together: Not on file    Attends religious service: Not on file    Active member of club or organization: Not on file    Attends meetings of clubs or organizations: Not on file    Relationship status: Not on file  . Intimate partner violence:    Fear of current or ex partner: Not on file    Emotionally abused: Not on file    Physically abused: Not on file    Forced sexual activity: Not on file  Other Topics Concern  . Not on file  Social History Narrative  . Not on file    FAMILY HISTORY: Family History  Problem Relation Age of Onset  . Heart failure Mother   . Melanoma Mother 45       has had several removed over the past ten years  . Prostate cancer Father 74  . Heart attack Maternal Grandmother   . Heart attack Maternal Grandfather         possible cancer, unknown type  . Stroke Paternal Grandmother   . Colon cancer Paternal Grandfather        dx upper 38s  . Hypertension Unknown   . Heart Problems Maternal Aunt        d.60  . Heart Problems Maternal Uncle        d.60  . Colon cancer Paternal Aunt        dx 52s  . Colon cancer Paternal Uncle        dx  70s    ALLERGIES:  is allergic to metrogel [metronidazole]; other; paxil [paroxetine hcl]; prednisone; tramadol; and zomig [zolmitriptan].  MEDICATIONS:  Current Outpatient Medications  Medication Sig Dispense Refill  . ALPRAZolam (XANAX) 0.25 MG tablet Take 1 tablet (0.25 mg total) by mouth at bedtime as needed for sleep or anxiety. 30 tablet 0  . atenolol (TENORMIN) 50 MG tablet Take 50 mg by mouth at bedtime.    Marland Kitchen dexamethasone (DECADRON) 4 MG tablet Take 1 tablet (4 mg total) by mouth 2 (two) times daily. Start the day before Taxotere. Take once the day after, then 2 times a day x 2d. 30 tablet 1  . lidocaine-prilocaine (EMLA) cream Apply to affected area once 30 g 3  . omeprazole (PRILOSEC) 20 MG capsule Take 20 mg by mouth daily.    . ondansetron (ZOFRAN) 8 MG tablet Take 1 tablet (8 mg total) by mouth 2 (two) times daily as needed (Nausea or vomiting). Begin 4 days after chemotherapy. 30 tablet 1  . potassium chloride SA (K-DUR,KLOR-CON) 20 MEQ tablet Take 1 tablet (20 mEq total) by mouth 2 (two) times daily. 30 tablet 1  . prochlorperazine (COMPAZINE) 10 MG tablet Take 1 tablet (10 mg total) by mouth every 6 (six) hours as needed (Nausea or vomiting). 30 tablet 1  . traMADol (ULTRAM) 50 MG tablet Take 1 tablet (50 mg total) by mouth every 12 (twelve) hours as needed. 30 tablet 0  . HYDROcodone-acetaminophen (NORCO/VICODIN) 5-325 MG tablet Take 1 tablet by mouth every 6 (six) hours as needed for severe pain. (Patient not taking: Reported on 04/05/2018) 20 tablet 0   No current facility-administered medications for this visit.     REVIEW OF SYSTEMS:    Constitutional: Denies fevers, chills or abnormal night sweats (+) dizziness in crowded areas  Eyes: Denies blurriness of vision, double vision or watery eyes Ears, nose, mouth, throat, and face: Denies mucositis (+) soar throat Respiratory: Denies cough, dyspnea or wheezes Cardiovascular: Denies palpitation, chest discomfort or lower extremity swelling Gastrointestinal: No diarrhea  (+) nausea, GERD, constipation  Skin: (+) skin rash on back (+) sensitive scalp  Lymphatics: Denies new lymphadenopathy or easy bruising Neurological:Denies numbness, tingling or new weaknesses Breast: (+) palpable mass in her right breast MSK: (+) bilateral hip pain that radiates to legs Behavioral/Psych: Mood is stable, no new changes  All other systems were reviewed with the patient and are negative.  PHYSICAL EXAMINATION:  ECOG PERFORMANCE STATUS: 1  Vitals:   04/05/18 1415  BP: (!) 151/79  Pulse: 73  Resp: 16  Temp: 98.7 F (37.1 C)  SpO2: 100%   Filed Weights   04/05/18 1415  Weight: 160 lb 9.6 oz (72.8 kg)    GENERAL:alert, no distress and comfortable SKIN: skin color, texture, turgor are normal, (+) red rash on her back EYES: normal, conjunctiva are pink and non-injected, sclera clear OROPHARYNX:no exudate, no erythema and lips, buccal mucosa, and tongue normal  NECK: supple, thyroid normal size, non-tender, without nodularity LYMPH:  no palpable lymphadenopathy in the cervical or inguinal (+) 1.5 cm right axillary LN LUNGS: clear to auscultation and percussion with normal breathing effort HEART: regular rate & rhythm and no murmurs and no lower extremity edema ABDOMEN:abdomen soft, non-tender and normal bowel sounds Musculoskeletal:no cyanosis of digits and no clubbing  Breast: A papable mass 2.5cm x 5cm in 3 o'clock position on her right breast. No nipple retraction or discharge. No skin changes. PSYCH: alert & oriented x 3 with fluent speech NEURO:  no focal motor/sensory  deficits  LABORATORY DATA:  I have reviewed the data as listed CBC Latest Ref Rng & Units 04/05/2018 03/30/2018 03/17/2018  WBC 3.9 - 10.3 K/uL 2.7(L) 13.2(H) 6.3  Hemoglobin 11.6 - 15.9 g/dL 13.5 13.9 13.6  Hematocrit 34.8 - 46.6 % 37.9 39.6 39.5  Platelets 145 - 400 K/uL 164 236 226    CMP Latest Ref Rng & Units 04/05/2018 03/30/2018 03/17/2018  Glucose 70 - 99 mg/dL 101(H) 149(H) 95  BUN 6 - 20 mg/dL _0 Creatinine 0.44 - 1.00 mg/dL 0.70 0.73 0.70  Sodium 135 - 145 mmol/L 140 141 142  Potassium 3.5 - 5.1 mmol/L 3.3(L) 2.9(LL) 3.3(L)  Chloride 98 - 111 mmol/L 100 106 105  CO2 22 - 32 mmol/L _1 Calcium 8.9 - 10.3 mg/dL 9.5 9.4 9.0  Total Protein 6.5 - 8.1 g/dL 6.9 7.3 7.3  Total Bilirubin 0.3 - 1.2 mg/dL 0.7 0.5 0.5  Alkaline Phos 38 - 126 U/L 81 86 84  AST 15 - 41 U/L _2 ALT 0 - 44 U/L _3 PATHOLOGY:   03/08/2018 Surgical Pathology ADDITIONAL INFORMATION: 1. PROGNOSTIC INDICATORS Results: IMMUNOHISTOCHEMICAL AND MORPHOMETRIC ANALYSIS PERFORMED MANUALLY The tumor cells are positive for Her2 (3+). Estrogen Receptor: 90%, POSITIVE, STRONG STAINING INTENSITY Progesterone Receptor: 5%, POSITIVE, STRONG STAINING INTENSITY Proliferation Marker Ki67: 60% REFERENCE RANGE ESTROGEN RECEPTOR NEGATIVE 0% POSITIVE =>1% REFERENCE RANGE PROGESTERONE RECEPTOR NEGATIVE 0% POSITIVE =>1% All controls stained appropriately Diagnosis 1. Breast, right, needle core biopsy, 2:30 - INVASIVE DUCTAL CARCINOMA, GRADE 3. - LYMPHOVASCULAR SPACE INVOLVEMENT BY TUMOR. 2. Lymph node, needle/core biopsy, right axilla - METASTATIC CARCINOMA INVOLVING SCANT LYMPHOID TISSUE. Synoptic Report 1. Breast prognostic profile will be performed. Dr. Vic Ripper agrees. Called to [facility] on 03/09/18. (JDP:gt, 03/09/18) Specimen Comment 1. Formalin 1:30 PM; right breast mass 2. Axillary adenopathy Specimen(s) Obtained: 1. Breast, right, needle core biopsy, 2:30 2.  Lymph node, needle/core biopsy, right axilla Specimen Clinical Information 1. R/O IMC 2. R/O axillary metastasis Gross 1. Received in formalin (time in formalin 1:30 p.m., cold ischemic time unknown) are two cores of soft tan yellow tissue measuring 1.3 and 1.8 cm in length and each 0.2 cm in diameter. The specimen is submitted in toto. 2. Received in formalin (time in formalin 1:30 p.m., cold ischemic time unknown) is a 1.3 x 0.2 cm core of soft hemorrhagic tissue and a separate 0.2 cm soft tan yellow tissue fragment. The specimen is submitted in toto. (GP:gt, 03/09/18) Stain(s) used in Diagnosis: The following stain(s) were used in diagnosing the case: ER-ACIS, PR-ACIS, Her2 by IHC, KI-67-ACIS. The control(s) stained appropriately.   PROCEDURES  Baseline ECHO 03/24/18 Study Conclusions - Left ventricle: The cavity size was normal. Wall thickness was   increased in a pattern of mild LVH. Systolic function was normal.   The estimated ejection fraction was in the range of 60% to 65%.   Wall motion was normal; there were no regional wall motion   abnormalities. Doppler parameters are consistent with abnormal   left ventricular relaxation (grade 1 diastolic dysfunction). - Right ventricle: The cavity size was mildly dilated. Wall   thickness was normal.  RADIOGRAPHIC STUDIES: I have personally reviewed the radiological images as listed and agreed with the findings in the report.   MRI Breast B/l 03/25/18 IMPRESSION: 1. The patient's primary malignancy in the posterior right breast measures 3.2 x 2 x 2.6 cm with apparent  invasion of the underlying pectoralis muscle. Numerous surrounding abnormal enhancing masses, consistent with satellite lesions, extend from 11 o'clock in the right breast into the inferolateral right breast with a total span of suspected disease measuring 4.5 x 4.6 x 6.4 cm in AP, transverse, and craniocaudal dimension. The suspected extent of disease involves the  superior and inferolateral quadrants. The primary malignancy also extends just across midline into the superior medial quadrant. 2. No MRI evidence of malignancy in the left breast. 3. Known metastatic noted in the right axilla.    Whole body bone scan 03/25/18 IMPRESSION: No scintigraphic evidence skeletal metastasis.   CT CAP W Contrast 03/25/18 IMPRESSION: 1. Mass within the deep aspect of the right breast along the pectoralis muscle compatible with primary breast malignancy. 2. There is suggestion of two additional possible masses within the lateral aspect of the right breast versus nodular appearing breast tissue. Recommend dedicated evaluation with bilateral breast MRI. 3. Mildly thickened right axillary lymph node compatible with metastatic adenopathy, recently biopsied. 4. Multiple bilateral pulmonary nodules are indeterminate, potentially sequelae of prior infectious/inflammatory process. Recommend attention on follow-up. Consider follow-up CT in 6 months. 5. Portal venous gas is demonstrated within the left hepatic lobe. Additionally, there is wall thickening of the cecum and ascending colon with small amount of gas within the pericolonic vasculature. Constellation of findings is indeterminate in etiology however may be secondary to colitis at this location with considerations including infectious, inflammatory or ischemic etiologies. Correlate for recent procedure. If patient is not up-to-date for colonic screening, recommend further evaluation with colonoscopy to exclude the possibility of colonic mass within the cecum/ascending colon. 6. These results were called by telephone at the time of interpretation on 03/25/2018 at 9:31 am to Dr. Truitt Merle , who verbally acknowledged these results.    MRI brain 03/20/18 IMPRESSION: No evidence of metastatic disease.  Normal appearance of brain. Slightly heterogeneous marrow pattern of the upper cervical spine, nonspecific  but likely benign, especially in the setting of early stage disease.    03/04/2018 Diagnostic Mammogram and Breast US IMPRESSION: 1. There is a suspicious mass in the right breast at 2:30 at the palpable site of concern.  2.  There is 1 suspicious lymph node in the right axilla.  02/24/2018 Screening Mammogram   ASSESSMENT & PLAN:  Claire Guzman is a 52 y.o. lovely female with a history of   1.Malignant neoplasm of upper-inner quadrant of right breast in female, invasive ductal carcinoma, cT2N1M0 stage IB, ER+/PR+/HER2+, G3  -She previously presented with a palpable right breast mass on exam.  She had had a screening mammogram on 02/24/2018 that was negative Breast US revealed a and biopsy revealed a 2.1 cm mass right breast, and one suspicious right axillary LN. -Biopsy revealed invasive breast cancer with metastasis to one LN. -Immunohistochemistry revealed that the tumor is Her2 positive, ER 90% PR 5% and Ki67 60%. -We previously discussed the risk of cancer recurrence after completed surgical resection. Due to her positive lymph nodes, HER-2 positive disease, her risk of recurrence is high. I previously recommended neoadjuvant or adjuvant chemotherapy with TCHP (docetaxel, carboplatin, Herceptin and pejeta every 3 weeks for total of 6 weeks, followed by maintenance Herceptin and Perjeta or adjuvant (TDM1). --Her 02/2018 staging scans showed no evidence of distant metastasis, with multiple indeterminate pulmonary nodules, colonic wall thickning, we will follow-up on that.  -her breast MRI showed additional satellite masses spanning 4.5 x 4.6 x 6.4 cm in right breast, left breast is  benign. I reviewed this with pt today.  She is scheduled to have additional right breast mass biopsy this week.  However patient prefers mastectomy anyway, so I will cancel her additional biopsy.  Dr. Tito Dine agrees with it.  -Her baseline ECHO from 03/24/18 showed adequate EF at 60-65% -She started neoadjuvant  chemo TCHP every 3 weeks on 03/30/18 -She tolerated the chemotherapy moderately well, but is complaining of body aches, hip pin, skin rash, soar throat, and indigestion, overall manageable.  We reviewed the management of the side effects. -Labs reviewed, CBC showed Hg 13.5, WBC 2.7K with neutrophil 0.5, Platelets 164K. CMP showed K 3.3.  We reviewed precautions for neutropenic fever -port placement by Dr. Dalbert Batman on 04/13/18   2.Genetics  -She has no family history of breast cancer, due to her relatively young age of breast cancer, she is a candidate for genetic testing. She expressed interest and underwent testing on 03/23/18. Results are pending.   3. Anxiety  -She has history of anxiety, currently on Xanax as needed  4. HTN -She will continue atenolol. We we will monitor her blood pressure closely during the chemotherapy, and adjust her medication as needed.  5. Skin rash -Red rash on her back -Mainly related to Herceptin and pejeta -She denies being in the sun -Recommend her to use over-the-counter hydrocortisone cream, if gets worse, I will prescribe clindamycin gel  6. Body pain -Secondary to chemotherapy and Neulasta -She will use tramadol as needed for severe pain  7.  Bilateral small lung nodules -Her restaging CT scan showed bilateral multiple small lung nodules, 3 to 4 mm, indeterminate, likely benign.  She is a never smoker -Plan to repeat CT chest without contrast in 6 months  Plan: -I will cancel her additional right breast biopsy  -port placement on 04/13/2018 -Add f/u on 9/25, I will see her in infusion room    All questions were answered. The patient knows to call the clinic with any problems, questions or concerns. I spent 20 minutes counseling the patient face to face. The total time spent in the appointment was 25 minutes and more than 50% was on counseling.  Dierdre Searles Dweik am acting as scribe for Dr. Truitt Merle.  I have reviewed the above documentation for accuracy  and completeness, and I agree with the above.      Truitt Merle, MD 04/05/2018 5:38 PM

## 2018-04-05 NOTE — Telephone Encounter (Signed)
-----   Message from Truitt Merle, MD sent at 04/05/2018  3:57 PM EDT ----- Claire Guzman,  Could you let pt know that we will cancel her breast biopsy, Dr. Dalbert Batman has agreed. Please also call breast center to cancer the biopsy appointment  Thanks  Krista Blue

## 2018-04-06 ENCOUNTER — Ambulatory Visit: Payer: BLUE CROSS/BLUE SHIELD

## 2018-04-06 ENCOUNTER — Telehealth: Payer: Self-pay

## 2018-04-06 NOTE — Telephone Encounter (Signed)
Per Dr. Burr Medico instructed patient to use Imodium for diarrhea (as previously instructed), take Tramadol for pain, Dr. Burr Medico will probably decrease her dosage for next chemo treatment.   Patient verbalized an understanding and knows to call back if symptoms worsen.

## 2018-04-06 NOTE — Telephone Encounter (Signed)
Patient calls stating had a terrible night, severe diarrhea, body aches from hell, skin hurts, nose bleed (which has stopped).    Needs advice on what to do?  Her [phone]

## 2018-04-07 ENCOUNTER — Telehealth: Payer: Self-pay

## 2018-04-07 ENCOUNTER — Telehealth: Payer: Self-pay | Admitting: *Deleted

## 2018-04-07 ENCOUNTER — Other Ambulatory Visit: Payer: Self-pay | Admitting: Hematology

## 2018-04-07 ENCOUNTER — Other Ambulatory Visit: Payer: BLUE CROSS/BLUE SHIELD

## 2018-04-07 MED ORDER — DOXYCYCLINE HYCLATE 100 MG PO TABS: 100.0000 mg | ORAL_TABLET | Freq: Two times a day (BID) | ORAL | 0 refills | Status: DC

## 2018-04-07 MED ORDER — TRIAMCINOLONE ACETONIDE 0.5 % EX OINT: 1.0000 "application " | TOPICAL_OINTMENT | Freq: Two times a day (BID) | CUTANEOUS | 1 refills | Status: DC

## 2018-04-07 NOTE — Telephone Encounter (Signed)
Spoke to patient per Dr. Burr Medico, rash is also on neck, chest and back.  Dr. Burr Medico will send in antibiotic Doxycycline and high powered Hydrocortisone cream to [facility].  Instructed her to start using right away and call if symptoms do not improve.  Patient verbalized an understanding.

## 2018-04-07 NOTE — Telephone Encounter (Signed)
"  Claire Guzman ([phone])[id number] to notify Dr. Burr Medico the trash has progressed.  Clindamycin is not working.  Rash is on my scalp, canot put a brush to it.  Just washed hair but can't tell a difference yet.  I also have a spot on my left eye that's bothersome at night.  My mouth feels like it's all chewed up by a blender.  I see redness."     Recommended mouth rinse gargles.  Reports using Biotene.

## 2018-04-09 ENCOUNTER — Encounter (HOSPITAL_BASED_OUTPATIENT_CLINIC_OR_DEPARTMENT_OTHER)
Admission: RE | Admit: 2018-04-09 | Discharge: 2018-04-09 | Disposition: A | Discharge status: Home / Self Care | Payer: BLUE CROSS/BLUE SHIELD | Source: Ambulatory Visit | Attending: General Surgery | Admitting: General Surgery

## 2018-04-09 DIAGNOSIS — F419 Anxiety disorder, unspecified: Secondary | ICD-10-CM | POA: Insufficient documentation

## 2018-04-09 DIAGNOSIS — C50211 Malignant neoplasm of upper-inner quadrant of right female breast: Secondary | ICD-10-CM | POA: Diagnosis not present

## 2018-04-09 DIAGNOSIS — Z9049 Acquired absence of other specified parts of digestive tract: Secondary | ICD-10-CM | POA: Diagnosis not present

## 2018-04-09 DIAGNOSIS — I10 Essential (primary) hypertension: Secondary | ICD-10-CM | POA: Diagnosis not present

## 2018-04-09 DIAGNOSIS — Z01818 Encounter for other preprocedural examination: Secondary | ICD-10-CM | POA: Insufficient documentation

## 2018-04-09 DIAGNOSIS — Z808 Family history of malignant neoplasm of other organs or systems: Secondary | ICD-10-CM | POA: Insufficient documentation

## 2018-04-09 DIAGNOSIS — Z800 Family history of malignant neoplasm of digestive organs: Secondary | ICD-10-CM | POA: Insufficient documentation

## 2018-04-09 DIAGNOSIS — L719 Rosacea, unspecified: Secondary | ICD-10-CM | POA: Diagnosis not present

## 2018-04-09 DIAGNOSIS — Z8249 Family history of ischemic heart disease and other diseases of the circulatory system: Secondary | ICD-10-CM | POA: Insufficient documentation

## 2018-04-09 LAB — BASIC METABOLIC PANEL
Anion gap: 11 (ref 5–15)
BUN: 9 mg/dL (ref 6–20)
CALCIUM: 9 mg/dL (ref 8.9–10.3)
CO2: 27 mmol/L (ref 22–32)
CREATININE: 0.66 mg/dL (ref 0.44–1.00)
Chloride: 102 mmol/L (ref 98–111)
Glucose, Bld: 95 mg/dL (ref 70–99)
Potassium: 3.7 mmol/L (ref 3.5–5.1)
SODIUM: 140 mmol/L (ref 135–145)

## 2018-04-09 NOTE — H&P (Signed)
Claire Guzman Location: [facility] Patient #: [id number] DOB: [birth year] Undefined / Language: [name] / Race: White Female       History of Present Illness        This is a 52 year old female. She is seen in the [facility] today by Dr. Burr Medico, Dr. Isidore Moos, and me. She was referred by Dr. Miquel Dunn with the [facility] for evaluation and management of locally advanced cancer of the right breast. Claire Guzman is her gynecologist. Dr. Mechele Collin is her PCP. Her husband and other family members are with her today.      She has no prior breast problems. She and Dr. Ouida Sills felt a lump on the right side. She went for bilateral mammograms which were category 1. That did not correlate and so she had an ultrasound which showed a 2.1 cm mass in the right breast, 2:30 position, 1 cm for the next nipple, far posterior area. There was also a suspicious lymph node. 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 Positive lymphovascular invasion. The lymph node also had metastatic breast cancer. ER 90%. PR 5%, Ki-67 60%, HER-2 positive. She has done well but thinks the lump is a little bit larger       Past history reveals laparoscopic appendectomy, C-section, hypertension, rosacea, anxiety Family history reveals colon cancer in a paternal grandfather, paternal aunt, paternal uncle. Mother and father are living and reasonably well. No breast or ovarian cancer in the family. There is some prostate cancer in the family. Social history reveals she is married. They have a 52 year old daughter. Lives in [place]. Denies alcohol or tobacco. She works at [employer] for [employer].      All of [place] had a very long talk with her. Her tumor feels to be almost 3 or 4 cm in size and is very central just at the areolar margin right breast upper outer. It does not appear to be fixed to the chest wall or the skin. However, Her breasts are small. At this time we  could not do a lumpectomy. Because of the size of the tumor and the high-grade nature of the tumor I have advised neoadjuvant chemotherapy to be followed in a few months by definitive breast surgery. Most likely this will be a mastectomy but I will leave the door open for breast conservation. Following neoadjuvant therapy her axilla can be managed with a targeted axillary dissection.      I described the techniques of lumpectomy, mastectomy, targeted axillary dissection, immediate or delayed breast reconstruction. I have told her that she will need a Port-A-Cath and a discussed the indications details techniques and risks of that with her and her family. The cancer center asked that we leave the port accessed and they will try to start the chemotherapy the next day   Breast MRI is scheduled on August 29 She is referred for genetic counseling Staging scans will be scheduled Port-A-Cath insertion will be scheduled   Addendum Note Dr Dorise Bullion at [facility] recommend biopsy of the most superior satellite lesion seen on image 125 of the MRI. AND biopsy of the inferolateral satellite lesion seen on image 182 to prove extent of disease with MRI guidance. While I was OOT Dr. Burr Medico kindly called patient , discussed MRI and CT/bone scan Further biopsies to be scheduled Chemo to start 9/3 and biopsies soon thereafter and port soon thereafter.  Addendum Note Port a cath scheduled 04/13/2018.  Addendum Note She has decided  on mastectomy, so further biopsies have been cancelled. Genetic testing blood draw done 03/23/2018   Past Surgical History  Appendectomy  Breast Biopsy  Right. Cesarean Section - 1   Diagnostic Studies History  Colonoscopy  never Mammogram  1-3 years ago  Medication History  Medications Reconciled  Social History Caffeine use  Carbonated beverages, Coffee, Tea. No alcohol use  No drug use  Tobacco use  Never smoker.  Family History Heart Disease   Mother. Melanoma  Mother.  Pregnancy / Birth History  Age at menarche  75 years. Contraceptive History  Oral contraceptives. Gravida  2 Irregular periods  Maternal age  6-35 Para  1  Other Problems  General anesthesia - complications  High blood pressure     Review of Systems ( General Not Present- Appetite Loss, Chills, Fatigue, Fever, Night Sweats, Weight Gain and Weight Loss. HEENT Not Present- Earache, Hearing Loss, Hoarseness, Nose Bleed, Oral Ulcers, Ringing in the Ears, Seasonal Allergies, Sinus Pain, Sore Throat, Visual Disturbances, Wears glasses/contact lenses and Yellow Eyes. Respiratory Not Present- Bloody sputum, Chronic Cough, Difficulty Breathing, Snoring and Wheezing. Breast Present- Nipple Discharge. Not Present- Breast Mass, Breast Pain and Skin Changes. Cardiovascular Not Present- Chest Pain, Difficulty Breathing Lying Down, Leg Cramps, Palpitations, Rapid Heart Rate, Shortness of Breath and Swelling of Extremities. Gastrointestinal Present- Abdominal Pain and Constipation. Not Present- Bloating, Bloody Stool, Change in Bowel Habits, Chronic diarrhea, Difficulty Swallowing, Excessive gas, Gets full quickly at meals, Hemorrhoids, Indigestion, Nausea, Rectal Pain and Vomiting. Female Genitourinary Not Present- Frequency, Nocturia, Painful Urination, Pelvic Pain and Urgency. Musculoskeletal Present- Back Pain. Not Present- Joint Pain, Joint Stiffness, Muscle Pain, Muscle Weakness and Swelling of Extremities. Neurological Present- Fainting. Not Present- Decreased Memory, Headaches, Numbness, Seizures, Tingling, Tremor, Trouble walking and Weakness. Psychiatric Not Present- Anxiety, Bipolar, Change in Sleep Pattern, Depression, Fearful and Frequent crying. Endocrine Present- Heat Intolerance. Not Present- Cold Intolerance, Excessive Hunger, Hair Changes, Hot flashes and New Diabetes. Hematology Not Present- Blood Thinners, Easy Bruising, Excessive bleeding, Gland  problems, HIV and Persistent Infections.   Physical Exam General Mental Status-Alert. General Appearance-Consistent with stated age. Hydration-Well hydrated. Voice-Normal.  Head and Neck Head-normocephalic, atraumatic with no lesions or palpable masses. Trachea-midline. Thyroid Gland Characteristics - normal size and consistency.  Eye Eyeball - Bilateral-Extraocular movements intact. Sclera/Conjunctiva - Bilateral-No scleral icterus.  Chest and Lung Exam Chest and lung exam reveals -quiet, even and easy respiratory effort with no use of accessory muscles and on auscultation, normal breath sounds, no adventitious sounds and normal vocal resonance. Inspection Chest Wall - Normal. Back - normal.  Breast Note: There is a 3-1/2-4 cm mobile mass in the right breast starting at the areolar margin at the 2:30 position extending medially. There is some ecchymoses. Some of this may or may not be hematoma. There is no other mass in either breast. There is no palpable axillary mass. Breasts are small and anterior to posterior dimension is not that great.   Cardiovascular Cardiovascular examination reveals -normal heart sounds, regular rate and rhythm with no murmurs and normal pedal pulses bilaterally.  Abdomen Inspection Inspection of the abdomen reveals - No Hernias. Skin - Scar - Note: Laparoscopic trochars scars from appendectomy. Pfannenstiel incision. Palpation/Percussion Palpation and Percussion of the abdomen reveal - Soft, Non Tender, No Rebound tenderness, No Rigidity (guarding) and No hepatosplenomegaly. Auscultation Auscultation of the abdomen reveals - Bowel sounds normal.  Neurologic Neurologic evaluation reveals -alert and oriented x 3 with no impairment of recent or remote memory. Mental  Status-Normal.  Musculoskeletal Normal Exam - Left-Upper Extremity Strength Normal and Lower Extremity Strength Normal. Normal Exam - Right-Upper  Extremity Strength Normal and Lower Extremity Strength Normal.  Lymphatic Head & Neck  General Head & Neck Lymphatics: Bilateral - Description - Normal. Axillary  General Axillary Region: Bilateral - Description - Normal. Tenderness - Non Tender. Femoral & Inguinal  Generalized Femoral & Inguinal Lymphatics: Bilateral - Description - Normal. Tenderness - Non Tender.    Assessment & Plan  PRIMARY CANCER OF UPPER INNER QUADRANT OF RIGHT FEMALE BREAST (C50.211)    Your recent imaging studies and biopsies show a cancer in the right breast, upper inner quadrant and also cancer in one of your right axillary lymph nodes This is a high-grade invasive ductal carcinoma The tumor is estrogen receptor positive and HER-2 positive  The tumor is relatively large compared to the size of your breast We are unable to do a lumpectomy at this point in time due to the size of the tumor You might need a mastectomy regardless  You'll be scheduled for bilateral breast MRI so that we can better assess the extent of your tumor and to make sure that the chest wall muscles were not involved you'll be scheduled for genetic counseling  I believe that you would benefit from having chemotherapy first and surgery a few months from now That is called neoadjuvant chemotherapy We would need to insert a Port-A-Cath, and we discussed the indications, techniques and numerous risk of that surgery today  Dr. Burr Medico will schedule you for bone scan and CT scans and echocardiogram  You and Dr. Burr Medico have decided to go ahead with chemotherapy, so we will schedule you for Port-A-Cath insertion  PRIMARY BREAST CANCER WITH METASTASIS TO MOVABLE IPSILATERAL LEVEL 1 OR 2 AXILLARY LYMPH NODES (N1) (C50.919) HYPERTENSION, ESSENTIAL (I10) HISTORY OF LAPAROSCOPIC APPENDECTOMY (Z90.49) FAMILY HISTORY OF COLON CANCER (Z80.0)    Edsel Petrin. Dalbert Batman, M.D., [facility] General and Minimally invasive  Surgery Breast and Colorectal Surgery Office:   [phone] Pager:   [phone]

## 2018-04-09 NOTE — Progress Notes (Signed)
Ensure pre surgery drink given with instructions to complete by 0830 dos, pt verbalized understanding. 

## 2018-04-11 ENCOUNTER — Other Ambulatory Visit: Payer: Self-pay | Admitting: Hematology

## 2018-04-11 DIAGNOSIS — Z170 Estrogen receptor positive status [ER+]: Secondary | ICD-10-CM

## 2018-04-11 DIAGNOSIS — C50211 Malignant neoplasm of upper-inner quadrant of right female breast: Secondary | ICD-10-CM

## 2018-04-12 ENCOUNTER — Ambulatory Visit: Payer: Self-pay | Admitting: Licensed Clinical Social Worker

## 2018-04-12 ENCOUNTER — Telehealth: Payer: Self-pay | Admitting: Licensed Clinical Social Worker

## 2018-04-12 ENCOUNTER — Encounter: Payer: Self-pay | Admitting: Licensed Clinical Social Worker

## 2018-04-12 ENCOUNTER — Encounter (HOSPITAL_BASED_OUTPATIENT_CLINIC_OR_DEPARTMENT_OTHER): Payer: Self-pay

## 2018-04-12 DIAGNOSIS — Z1379 Encounter for other screening for genetic and chromosomal anomalies: Secondary | ICD-10-CM | POA: Insufficient documentation

## 2018-04-12 DIAGNOSIS — C50211 Malignant neoplasm of upper-inner quadrant of right female breast: Secondary | ICD-10-CM

## 2018-04-12 DIAGNOSIS — Z170 Estrogen receptor positive status [ER+]: Secondary | ICD-10-CM

## 2018-04-12 DIAGNOSIS — Z800 Family history of malignant neoplasm of digestive organs: Secondary | ICD-10-CM

## 2018-04-12 DIAGNOSIS — Z808 Family history of malignant neoplasm of other organs or systems: Secondary | ICD-10-CM

## 2018-04-12 DIAGNOSIS — Z8042 Family history of malignant neoplasm of prostate: Secondary | ICD-10-CM

## 2018-04-12 NOTE — Progress Notes (Signed)
HPI:  Claire Guzman was previously seen in the [place] clinic on 8/272019 due to a recent diagnosis of breast cancer, family history of colon cancer, and concerns regarding a hereditary predisposition to cancer. Please refer to our prior cancer genetics clinic note for more information regarding Claire Guzman's medical, social and family histories, and our assessment and recommendations, at the time. Claire Guzman's recent genetic test results were disclosed to her, as well as recommendations warranted by these results. These results and recommendations are discussed in more detail below.  CANCER HISTORY:  Oncology History   Cancer Staging Malignant neoplasm of upper-inner quadrant of right breast in female, estrogen receptor positive ([place]) Staging form: Breast, AJCC 8th Edition - Clinical stage from 03/08/2018: Stage IB (cT2, cN1, cM0, G3, ER+, PR+, HER2+) - Signed by Truitt Merle, MD on 03/16/2018       Malignant neoplasm of upper-inner quadrant of right breast in female, estrogen receptor positive ([place])   02/24/2018 Mammogram    screening mammogram on 02/24/2018 that was benign    03/04/2018 Mammogram    diagnostic mammogram on 03/04/2018 due to a palpable mass on her right breast. Diagnostic mammogram, breast US revealed and biopsy revealed suspicious mass in the right breast at 2:30 at the palpable site of concern and one suspicious right axillary LN.    03/08/2018 Cancer Staging    Staging form: Breast, AJCC 8th Edition - Clinical stage from 03/08/2018: Stage IB (cT2, cN1, cM0, G3, ER+, PR+, HER2+) - Signed by Truitt Merle, MD on 03/16/2018    03/08/2018 Receptors her2    Her2 positive, ER 90% PR 5% and Ki67 60%    03/08/2018 Pathology Results    Diagnosis 1. Breast, right, needle core biopsy, 2:30 - INVASIVE DUCTAL CARCINOMA, GRADE 3. - LYMPHOVASCULAR SPACE INVOLVEMENT BY TUMOR. 2. Lymph node, needle/core biopsy, right axilla - METASTATIC CARCINOMA INVOLVING SCANT LYMPHOID TISSUE.    03/10/2018 Initial Diagnosis    Malignant neoplasm of upper-inner quadrant of right breast in female, estrogen receptor positive ([place])    03/20/2018 Imaging    MRI brain 03/20/18 IMPRESSION: No evidence of metastatic disease.  Normal appearance of brain. Slightly heterogeneous marrow pattern of the upper cervical spine, nonspecific but likely benign, especially in the setting of early stage disease.     03/24/2018 Echocardiogram    Baseline ECHO 03/24/18 Study Conclusions - Left ventricle: The cavity size was normal. Wall thickness was   increased in a pattern of mild LVH. Systolic function was normal.   The estimated ejection fraction was in the range of 60% to 65%.   Wall motion was normal; there were no regional wall motion   abnormalities. Doppler parameters are consistent with abnormal   left ventricular relaxation (grade 1 diastolic dysfunction). - Right ventricle: The cavity size was mildly dilated. Wall   thickness was normal.    03/25/2018 Imaging    MRI Breast B/l 03/25/18 IMPRESSION: 1. The patient's primary malignancy in the posterior right breast measures 3.2 x 2 x 2.6 cm with apparent invasion of the underlying pectoralis muscle. Numerous surrounding abnormal enhancing masses, consistent with satellite lesions, extend from 11 o'clock in the right breast into the inferolateral right breast with a total span of suspected disease measuring 4.5 x 4.6 x 6.4 cm in AP, transverse, and craniocaudal dimension. The suspected extent of disease involves the superior and inferolateral quadrants. The primary malignancy also extends just across midline into the superior medial quadrant. 2. No MRI evidence of malignancy in  the left breast. 3. Known metastatic noted in the right axilla.    03/25/2018 Imaging    Whole body bone scan 03/25/18 IMPRESSION: No scintigraphic evidence skeletal metastasis.    03/25/2018 Imaging    CT CAP W Contrast 03/25/18 IMPRESSION: 1. Mass within the  deep aspect of the right breast along the pectoralis muscle compatible with primary breast malignancy. 2. There is suggestion of two additional possible masses within the lateral aspect of the right breast versus nodular appearing breast tissue. Recommend dedicated evaluation with bilateral breast MRI. 3. Mildly thickened right axillary lymph node compatible with metastatic adenopathy, recently biopsied. 4. Multiple bilateral pulmonary nodules are indeterminate, potentially sequelae of prior infectious/inflammatory process. Recommend attention on follow-up. Consider follow-up CT in 6 months. 5. Portal venous gas is demonstrated within the left hepatic lobe. Additionally, there is wall thickening of the cecum and ascending colon with small amount of gas within the pericolonic vasculature. Constellation of findings is indeterminate in etiology however may be secondary to colitis at this location with considerations including infectious, inflammatory or ischemic etiologies. Correlate for recent procedure. If patient is not up-to-date for colonic screening, recommend further evaluation with colonoscopy to exclude the possibility of colonic mass within the cecum/ascending colon. 6. These results were called by telephone at the time of interpretation on 03/25/2018 at 9:31 am to Dr. Truitt Merle , who verbally acknowledged these results.    03/30/2018 -  Chemotherapy    Neoadjuvant TCHP every 3 weeks starting 03/30/18     Genetic Testing    Negative genetic testing on the [facility] Common Hereditary Cancers Panel. The Common Hereditary Cancers Panel offered by [facility] includes sequencing and/or deletion duplication testing of the following 47 genes: APC, ATM, AXIN2, BARD1, BMPR1A, BRCA1, BRCA2, BRIP1, CDH1, CDKN2A (p14ARF), CDKN2A (p16INK4a), CKD4, CHEK2, CTNNA1, DICER1, EPCAM (Deletion/duplication testing only), GREM1 (promoter region deletion/duplication testing only), KIT, MEN1, MLH1, MSH2, MSH3, MSH6,  MUTYH, NBN, NF1, NHTL1, PALB2, PDGFRA, PMS2, POLD1, POLE, PTEN, RAD50, RAD51C, RAD51D, SDHB, SDHC, SDHD, SMAD4, SMARCA4. STK11, TP53, TSC1, TSC2, and VHL.  The following genes were evaluated for sequence changes only: SDHA and HOXB13 c.251G>A variant only.   Genetic testing did detect a Variant of Unknown Significance (VUS) in the POLD1 gene called c.985C>T (p.Pro329Ser). At this time, it is unknown if this variant is associated with increased cancer risk or if this is a normal finding, but most variants such as this get reclassified to being inconsequential. It should not be used to make medical management decisions.   The report date is 04/09/2018.      FAMILY HISTORY:  We obtained a detailed, 4-generation family history.  Significant diagnoses are listed below: Family History  Problem Relation Age of Onset  . Heart failure Mother   . Melanoma Mother 30       has had several removed over the past ten years  . Prostate cancer Father 38  . Heart attack Maternal Grandmother   . Heart attack Maternal Grandfather        possible cancer, unknown type  . Stroke Paternal Grandmother   . Colon cancer Paternal Grandfather        dx upper 80s  . Hypertension Unknown   . Heart Problems Maternal Aunt        d.60  . Heart Problems Maternal Uncle        d.60  . Colon cancer Paternal Aunt        dx 73s  . Colon cancer Paternal Uncle  dx 18s   Claire Guzman has one daughter, age 27. She has a sister, age 26, who has a son age 72.   Claire Guzman's father was diagnosed with prostate cancer at age 69, he is living at 76 and doing well. He has two sisters, and two brothers. One of his sisters was diagnosed with colon cancer in her 73's and passed away in her 51's. His other sister is living in her 32's. One of his brothers was diagnosed with colon cancer in his 41's and passed away at 60. Claire Guzman has 8 paternal cousins, no cancer history. Claire Guzman's paternal grandfather was diagnosed with  colon cancer in his upper 62's and passed away in his upper 40's. Her paternal grandmother died in her 23's of a stroke.   Claire Guzman's mother has had several melanomas since age 90. These have been located on her head, neck, face and back. She is living at age 54. She has a sister and a brother, both passed away at age 66 of heart issues. Claire Guzman has 7 maternal cousins. One of them she believes had cancer and died in her 61's but she does not know what type of cancer. The other cousins are cancer free. Claire Guzman's maternal grandfather also possibly had cancer but she does not know what type, he died in his 30's. Claire Guzman's maternal grandmother died at 79.  Claire Guzman is unaware of previous family history of genetic testing for hereditary cancer risks. Patient's maternal ancestors are of [place] descent, and paternal ancestors are of [place] descent. There is no reported Ashkenazi Jewish ancestry. There is no known consanguinity.  GENETIC TEST RESULTS: Genetic testing performed through [facility]'s Common Hereditary Cancers Panel reported out on 04/12/2018 showed no pathogenic mutations. The Common Hereditary Cancers Panel offered by [facility] includes sequencing and/or deletion duplication testing of the following 47 genes: APC, ATM, AXIN2, BARD1, BMPR1A, BRCA1, BRCA2, BRIP1, CDH1, CDKN2A (p14ARF), CDKN2A (p16INK4a), CKD4, CHEK2, CTNNA1, DICER1, EPCAM (Deletion/duplication testing only), GREM1 (promoter region deletion/duplication testing only), KIT, MEN1, MLH1, MSH2, MSH3, MSH6, MUTYH, NBN, NF1, NHTL1, PALB2, PDGFRA, PMS2, POLD1, POLE, PTEN, RAD50, RAD51C, RAD51D, SDHB, SDHC, SDHD, SMAD4, SMARCA4. STK11, TP53, TSC1, TSC2, and VHL.  The following genes were evaluated for sequence changes only: SDHA and HOXB13 c.251G>A variant only.  A variant of uncertain significance (VUS) in a gene called POLD1 was also noted.   The test report will be scanned into EPIC and will be located under the Molecular  Pathology section of the Results Review tab. A portion of the result report is included below for reference.    We discussed with Claire Guzman that because current genetic testing is not perfect, it is possible there may be a gene mutation in one of these genes that current testing cannot detect, but that chance is small.  We also discussed, that there could be another gene that has not yet been discovered, or that we have not yet tested, that is responsible for the cancer diagnoses in the family. It is also possible there is a hereditary cause for the cancer in the family that Claire Guzman did not inherit and therefore was not identified in her testing.  Therefore, it is important to remain in touch with cancer genetics in the future so that we can continue to offer Claire Guzman the most up to date genetic testing.   Regarding the VUS in POLD1: At this time, it is unknown if this variant is associated with increased cancer  risk or if this is a normal finding, but most variants such as this get reclassified to being inconsequential. It should not be used to make medical management decisions. With time, we suspect the lab will determine the significance of this variant, if any. If we do learn more about it, we will try to contact Claire Guzman to discuss it further. However, it is important to stay in touch with [place] periodically and keep the address and phone number up to date.  ADDITIONAL GENETIC TESTING: While Claire Guzman has had fairly extensive testing, there are other genes that are associated with increased cancer risk that can be analyzed. The laboratories that offer this testing look at these additional genes via a hereditary cancer gene panel. Should Claire Guzman wish to pursue additional genetic testing, we are happy to discuss and coordinate this testing, at any time.    CANCER SCREENING RECOMMENDATIONS: Claire Guzman's test result is considered negative (normal).  This means that we have not identified a  hereditary cause for her personal history of breast cancer and family history of colon cancer at this time.   This normal indicates that it is unlikely Ms. Muska has an increased risk of cancer due to a mutation in one of these genes. While reassuring, this does not definitively rule out a hereditary predisposition to cancer. It is still possible that there could be genetic mutations that are undetectable by current technology, or genetic mutations in genes that have not been tested or identified to increase cancer risk.  Therefore, it is recommended she continue to follow the cancer management and screening guidelines provided by her oncology and primary healthcare provider. An individual's cancer risk is not determined by genetic test results alone.  Overall cancer risk assessment includes additional factors such as personal medical history, family history, etc.  These should be used to make a personalized plan for cancer prevention and surveillance.    RECOMMENDATIONS FOR FAMILY MEMBERS:  Relatives in this family might be at some increased risk of developing cancer, over the general population risk, simply due to the family history of cancer.  We recommended women in this family have a yearly mammogram beginning at age 69, or 68 years younger than the earliest onset of cancer, an annual clinical breast exam, and perform monthly breast self-exams. Women in this family should also have a gynecological exam as recommended by their primary provider. All family members should have a colonoscopy by age 42 (or as directed by their doctors).  All family members should inform their physicians about the family history of cancer so their doctors can make the most appropriate screening recommendations for them.   It is also possible there is a hereditary cause for the cancer in Ms. Shamblin's family that she did not inherit and therefore was not identified in her.  We recommended her paternal cousins (the children of  her relatives with colon cancer history), have genetic counseling and testing. Ms. Bergevin will let us know if we can be of any assistance in coordinating genetic counseling and/or testing for these family members.    FOLLOW-UP: Lastly, we discussed with Ms. Lux that cancer genetics is a rapidly advancing field and it is possible that new genetic tests will be appropriate for her and/or her family members in the future. We encouraged her to remain in contact with cancer genetics on an annual basis so we can update her personal and family histories and let her know of advances in cancer genetics that may  benefit this family.   Our contact number was provided. Ms. Kanno's questions were answered to her satisfaction, and she knows she is welcome to call us at anytime with additional questions or concerns.   Epimenio Foot, MS Genetic Counselor [email or web address] Phone: [phone]

## 2018-04-12 NOTE — Telephone Encounter (Signed)
Revealed negative genetic testing.  Revealed that a VUS in POLD1 was identified.   This normal result is reassuring and indicates that it is unlikely Claire Guzman's cancer is due to a hereditary cause.  It is unlikely that there is an increased risk of another cancer due to a mutation in one of these genes.  However, genetic testing is not perfect, and cannot definitively rule out a hereditary cause.  It will be important for her to keep in contact with genetics to learn if any additional testing may be needed in the future.

## 2018-04-13 ENCOUNTER — Encounter (HOSPITAL_BASED_OUTPATIENT_CLINIC_OR_DEPARTMENT_OTHER): Payer: Self-pay

## 2018-04-13 ENCOUNTER — Ambulatory Visit (HOSPITAL_BASED_OUTPATIENT_CLINIC_OR_DEPARTMENT_OTHER): Payer: BLUE CROSS/BLUE SHIELD | Admitting: Anesthesiology

## 2018-04-13 ENCOUNTER — Ambulatory Visit (HOSPITAL_BASED_OUTPATIENT_CLINIC_OR_DEPARTMENT_OTHER)
Admission: RE | Admit: 2018-04-13 | Discharge: 2018-04-13 | Disposition: A | Discharge status: Home / Self Care | Payer: BLUE CROSS/BLUE SHIELD | Source: Ambulatory Visit | Attending: General Surgery | Admitting: General Surgery

## 2018-04-13 ENCOUNTER — Encounter (HOSPITAL_BASED_OUTPATIENT_CLINIC_OR_DEPARTMENT_OTHER)
Admission: RE | Disposition: A | Discharge status: Home / Self Care | Payer: Self-pay | Source: Ambulatory Visit | Attending: General Surgery

## 2018-04-13 ENCOUNTER — Other Ambulatory Visit: Payer: Self-pay

## 2018-04-13 ENCOUNTER — Ambulatory Visit (HOSPITAL_COMMUNITY): Payer: BLUE CROSS/BLUE SHIELD

## 2018-04-13 DIAGNOSIS — C50211 Malignant neoplasm of upper-inner quadrant of right female breast: Secondary | ICD-10-CM | POA: Insufficient documentation

## 2018-04-13 DIAGNOSIS — C50911 Malignant neoplasm of unspecified site of right female breast: Secondary | ICD-10-CM | POA: Diagnosis not present

## 2018-04-13 DIAGNOSIS — C773 Secondary and unspecified malignant neoplasm of axilla and upper limb lymph nodes: Secondary | ICD-10-CM | POA: Insufficient documentation

## 2018-04-13 DIAGNOSIS — Z452 Encounter for adjustment and management of vascular access device: Secondary | ICD-10-CM | POA: Diagnosis not present

## 2018-04-13 DIAGNOSIS — Z95828 Presence of other vascular implants and grafts: Secondary | ICD-10-CM

## 2018-04-13 DIAGNOSIS — Z170 Estrogen receptor positive status [ER+]: Secondary | ICD-10-CM | POA: Diagnosis not present

## 2018-04-13 DIAGNOSIS — Z79899 Other long term (current) drug therapy: Secondary | ICD-10-CM | POA: Diagnosis not present

## 2018-04-13 DIAGNOSIS — Z800 Family history of malignant neoplasm of digestive organs: Secondary | ICD-10-CM | POA: Insufficient documentation

## 2018-04-13 DIAGNOSIS — K219 Gastro-esophageal reflux disease without esophagitis: Secondary | ICD-10-CM | POA: Diagnosis not present

## 2018-04-13 DIAGNOSIS — Z419 Encounter for procedure for purposes other than remedying health state, unspecified: Secondary | ICD-10-CM

## 2018-04-13 DIAGNOSIS — F419 Anxiety disorder, unspecified: Secondary | ICD-10-CM | POA: Diagnosis not present

## 2018-04-13 DIAGNOSIS — I10 Essential (primary) hypertension: Secondary | ICD-10-CM | POA: Diagnosis not present

## 2018-04-13 HISTORY — DX: Gastro-esophageal reflux disease without esophagitis: K21.9

## 2018-04-13 HISTORY — DX: Malignant (primary) neoplasm, unspecified: C80.1

## 2018-04-13 HISTORY — DX: Low back pain: M54.5

## 2018-04-13 HISTORY — PX: PORTACATH PLACEMENT: SHX2246

## 2018-04-13 HISTORY — DX: Low back pain, unspecified: M54.50

## 2018-04-13 SURGERY — INSERTION, TUNNELED CENTRAL VENOUS DEVICE, WITH PORT
Anesthesia: General | Site: Chest | Laterality: Left

## 2018-04-13 MED ORDER — CHLORHEXIDINE GLUCONATE CLOTH 2 % EX PADS: 6.0000 | MEDICATED_PAD | Freq: Once | CUTANEOUS | Status: DC

## 2018-04-13 MED ORDER — PROMETHAZINE HCL 25 MG/ML IJ SOLN: 6.2500 mg | INTRAMUSCULAR | Status: DC | PRN

## 2018-04-13 MED ORDER — LACTATED RINGERS IV SOLN
INTRAVENOUS | Status: DC
  Administered 2018-04-13 (×2): via INTRAVENOUS

## 2018-04-13 MED ORDER — CELECOXIB 200 MG PO CAPS
200.0000 mg | ORAL_CAPSULE | ORAL | Status: AC
  Administered 2018-04-13: 200 mg via ORAL

## 2018-04-13 MED ORDER — PROPOFOL 10 MG/ML IV BOLUS
INTRAVENOUS | Status: DC | PRN
  Administered 2018-04-13: 150 mg via INTRAVENOUS

## 2018-04-13 MED ORDER — PROPOFOL 10 MG/ML IV BOLUS
INTRAVENOUS | Status: AC
  Filled 2018-04-13: qty 20

## 2018-04-13 MED ORDER — BUPIVACAINE-EPINEPHRINE (PF) 0.5% -1:200000 IJ SOLN
INTRAMUSCULAR | Status: DC | PRN
  Administered 2018-04-13: 10 mL

## 2018-04-13 MED ORDER — CELECOXIB 200 MG PO CAPS
ORAL_CAPSULE | ORAL | Status: AC
  Filled 2018-04-13: qty 1

## 2018-04-13 MED ORDER — ACETAMINOPHEN 500 MG PO TABS
1000.0000 mg | ORAL_TABLET | ORAL | Status: AC
  Administered 2018-04-13: 1000 mg via ORAL

## 2018-04-13 MED ORDER — HEPARIN SOD (PORK) LOCK FLUSH 100 UNIT/ML IV SOLN
INTRAVENOUS | Status: DC | PRN
  Administered 2018-04-13: 500 [IU]

## 2018-04-13 MED ORDER — ONDANSETRON HCL 4 MG/2ML IJ SOLN
INTRAMUSCULAR | Status: DC | PRN
  Administered 2018-04-13: 4 mg via INTRAVENOUS

## 2018-04-13 MED ORDER — DEXAMETHASONE SODIUM PHOSPHATE 10 MG/ML IJ SOLN
INTRAMUSCULAR | Status: AC
  Filled 2018-04-13: qty 4

## 2018-04-13 MED ORDER — ONDANSETRON HCL 4 MG/2ML IJ SOLN
INTRAMUSCULAR | Status: AC
  Filled 2018-04-13: qty 12

## 2018-04-13 MED ORDER — FENTANYL CITRATE (PF) 100 MCG/2ML IJ SOLN
INTRAMUSCULAR | Status: AC
  Filled 2018-04-13: qty 2

## 2018-04-13 MED ORDER — SCOPOLAMINE 1 MG/3DAYS TD PT72: 1.0000 | MEDICATED_PATCH | Freq: Once | TRANSDERMAL | Status: DC | PRN

## 2018-04-13 MED ORDER — ACETAMINOPHEN 500 MG PO TABS
ORAL_TABLET | ORAL | Status: AC
  Filled 2018-04-13: qty 2

## 2018-04-13 MED ORDER — HYDROCODONE-ACETAMINOPHEN 5-325 MG PO TABS: 1.0000 | ORAL_TABLET | Freq: Four times a day (QID) | ORAL | 0 refills | Status: DC | PRN

## 2018-04-13 MED ORDER — MIDAZOLAM HCL 2 MG/2ML IJ SOLN
1.0000 mg | INTRAMUSCULAR | Status: DC | PRN
  Administered 2018-04-13: 2 mg via INTRAVENOUS

## 2018-04-13 MED ORDER — MIDAZOLAM HCL 2 MG/2ML IJ SOLN
INTRAMUSCULAR | Status: AC
  Filled 2018-04-13: qty 2

## 2018-04-13 MED ORDER — FENTANYL CITRATE (PF) 100 MCG/2ML IJ SOLN
50.0000 ug | INTRAMUSCULAR | Status: DC | PRN
  Administered 2018-04-13: 100 ug via INTRAVENOUS

## 2018-04-13 MED ORDER — LIDOCAINE 2% (20 MG/ML) 5 ML SYRINGE
INTRAMUSCULAR | Status: AC
  Filled 2018-04-13: qty 25

## 2018-04-13 MED ORDER — CEFAZOLIN SODIUM-DEXTROSE 2-4 GM/100ML-% IV SOLN
INTRAVENOUS | Status: AC
  Filled 2018-04-13: qty 100

## 2018-04-13 MED ORDER — EPHEDRINE 5 MG/ML INJ
INTRAVENOUS | Status: AC
  Filled 2018-04-13: qty 20

## 2018-04-13 MED ORDER — GABAPENTIN 300 MG PO CAPS
300.0000 mg | ORAL_CAPSULE | ORAL | Status: AC
  Administered 2018-04-13: 300 mg via ORAL

## 2018-04-13 MED ORDER — CEFAZOLIN SODIUM-DEXTROSE 2-4 GM/100ML-% IV SOLN
2.0000 g | INTRAVENOUS | Status: AC
  Administered 2018-04-13: 2 g via INTRAVENOUS

## 2018-04-13 MED ORDER — FENTANYL CITRATE (PF) 100 MCG/2ML IJ SOLN
25.0000 ug | INTRAMUSCULAR | Status: DC | PRN
  Administered 2018-04-13: 50 ug via INTRAVENOUS

## 2018-04-13 MED ORDER — OXYCODONE HCL 5 MG PO TABS: 5.0000 mg | ORAL_TABLET | Freq: Once | ORAL | Status: DC | PRN

## 2018-04-13 MED ORDER — GABAPENTIN 300 MG PO CAPS
ORAL_CAPSULE | ORAL | Status: AC
  Filled 2018-04-13: qty 1

## 2018-04-13 MED ORDER — HEPARIN (PORCINE) IN NACL 2-0.9 UNITS/ML
INTRAMUSCULAR | Status: AC | PRN
  Administered 2018-04-13: 20 mL

## 2018-04-13 MED ORDER — LIDOCAINE HCL (CARDIAC) PF 100 MG/5ML IV SOSY
PREFILLED_SYRINGE | INTRAVENOUS | Status: DC | PRN
  Administered 2018-04-13: 60 mg via INTRAVENOUS

## 2018-04-13 MED ORDER — OXYCODONE HCL 5 MG/5ML PO SOLN: 5.0000 mg | Freq: Once | ORAL | Status: DC | PRN

## 2018-04-13 SURGICAL SUPPLY — 52 items
ADH SKN CLS APL DERMABOND .7 (GAUZE/BANDAGES/DRESSINGS) ×1
APL SKNCLS STERI-STRIP NONHPOA (GAUZE/BANDAGES/DRESSINGS)
BAG DECANTER FOR FLEXI CONT (MISCELLANEOUS) ×2 IMPLANT
BENZOIN TINCTURE PRP APPL 2/3 (GAUZE/BANDAGES/DRESSINGS) IMPLANT
BLADE HEX COATED 2.75 (ELECTRODE) ×2 IMPLANT
BLADE SURG 15 STRL LF DISP TIS (BLADE) ×1 IMPLANT
BLADE SURG 15 STRL SS (BLADE) ×2
CANISTER SUCT 1200ML W/VALVE (MISCELLANEOUS) IMPLANT
CHLORAPREP W/TINT 26ML (MISCELLANEOUS) ×2 IMPLANT
COVER BACK TABLE 60X90IN (DRAPES) ×2 IMPLANT
COVER MAYO STAND STRL (DRAPES) ×2 IMPLANT
COVER PROBE 5X48 (MISCELLANEOUS)
DECANTER SPIKE VIAL GLASS SM (MISCELLANEOUS) IMPLANT
DERMABOND ADVANCED (GAUZE/BANDAGES/DRESSINGS) ×1
DERMABOND ADVANCED .7 DNX12 (GAUZE/BANDAGES/DRESSINGS) ×1 IMPLANT
DRAPE C-ARM 42X72 X-RAY (DRAPES) ×2 IMPLANT
DRAPE LAPAROSCOPIC ABDOMINAL (DRAPES) ×2 IMPLANT
DRAPE UTILITY XL STRL (DRAPES) ×2 IMPLANT
DRSG TEGADERM 2-3/8X2-3/4 SM (GAUZE/BANDAGES/DRESSINGS) IMPLANT
DRSG TEGADERM 4X10 (GAUZE/BANDAGES/DRESSINGS) IMPLANT
DRSG TEGADERM 4X4.75 (GAUZE/BANDAGES/DRESSINGS) IMPLANT
ELECT REM PT RETURN 9FT ADLT (ELECTROSURGICAL) ×2
ELECTRODE REM PT RTRN 9FT ADLT (ELECTROSURGICAL) ×1 IMPLANT
GAUZE SPONGE 4X4 12PLY STRL LF (GAUZE/BANDAGES/DRESSINGS) IMPLANT
GLOVE BIO SURGEON STRL SZ7 (GLOVE) ×2 IMPLANT
GLOVE EUDERMIC 7 POWDERFREE (GLOVE) ×2 IMPLANT
GOWN STRL REUS W/ TWL LRG LVL3 (GOWN DISPOSABLE) ×1 IMPLANT
GOWN STRL REUS W/ TWL XL LVL3 (GOWN DISPOSABLE) ×1 IMPLANT
GOWN STRL REUS W/TWL LRG LVL3 (GOWN DISPOSABLE) ×2
GOWN STRL REUS W/TWL XL LVL3 (GOWN DISPOSABLE) ×2
IV CATH PLACEMENT UNIT 16 GA (IV SOLUTION) IMPLANT
IV KIT MINILOC 20X1 SAFETY (NEEDLE) IMPLANT
KIT CVR 48X5XPRB PLUP LF (MISCELLANEOUS) IMPLANT
KIT PORT POWER 8FR ISP CVUE (Port) ×2 IMPLANT
NEEDLE BLUNT 17GA (NEEDLE) IMPLANT
NEEDLE HYPO 22GX1.5 SAFETY (NEEDLE) ×2 IMPLANT
NEEDLE HYPO 25X1 1.5 SAFETY (NEEDLE) ×2 IMPLANT
PACK BASIN DAY SURGERY FS (CUSTOM PROCEDURE TRAY) ×2 IMPLANT
PENCIL BUTTON HOLSTER BLD 10FT (ELECTRODE) ×2 IMPLANT
SET SHEATH INTRODUCER 10FR (MISCELLANEOUS) IMPLANT
SHEATH COOK PEEL AWAY SET 9F (SHEATH) IMPLANT
SLEEVE SCD COMPRESS KNEE MED (MISCELLANEOUS) ×2 IMPLANT
STRIP CLOSURE SKIN 1/2X4 (GAUZE/BANDAGES/DRESSINGS) IMPLANT
SUT MNCRL AB 4-0 PS2 18 (SUTURE) ×2 IMPLANT
SUT PROLENE 2 0 CT2 30 (SUTURE) ×2 IMPLANT
SUT VICRYL 3-0 CR8 SH (SUTURE) ×2 IMPLANT
SYR 10ML LL (SYRINGE) ×2 IMPLANT
SYR 5ML LUER SLIP (SYRINGE) ×2 IMPLANT
TOWEL GREEN STERILE FF (TOWEL DISPOSABLE) ×4 IMPLANT
TOWEL OR NON WOVEN STRL DISP B (DISPOSABLE) ×2 IMPLANT
TUBE CONNECTING 20X1/4 (TUBING) IMPLANT
YANKAUER SUCT BULB TIP NO VENT (SUCTIONS) IMPLANT

## 2018-04-13 NOTE — Anesthesia Preprocedure Evaluation (Addendum)
Anesthesia Evaluation  Patient identified by MRN, date of birth, ID band Patient awake    Reviewed: Allergy & Precautions, NPO status , Patient's Chart, lab work & pertinent test results, reviewed documented beta blocker date and time   History of Anesthesia Complications Negative for: history of anesthetic complications  Airway Mallampati: II  TM Distance: <3 FB Neck ROM: Full    Dental  (+) Dental Advisory Given, Teeth Intact   Pulmonary neg pulmonary ROS,    breath sounds clear to auscultation       Cardiovascular hypertension, Pt. on medications and Pt. on home beta blockers (-) angina Rhythm:Regular Rate:Normal     Neuro/Psych Anxiety negative neurological ROS     GI/Hepatic Neg liver ROS, GERD  Medicated and Controlled,  Endo/Other  negative endocrine ROS  Renal/GU negative Renal ROS  negative genitourinary   Musculoskeletal  Low back pain    Abdominal   Peds  Hematology  Neutropenia    Anesthesia Other Findings   Reproductive/Obstetrics  Breast cancer                            Anesthesia Physical Anesthesia Plan  ASA: II  Anesthesia Plan: General   Post-op Pain Management:    Induction: Intravenous  PONV Risk Score and Plan: 3 and Treatment may vary due to age or medical condition, Ondansetron, Dexamethasone and Midazolam  Airway Management Planned: LMA  Additional Equipment: None  Intra-op Plan:   Post-operative Plan: Extubation in OR  Informed Consent: I have reviewed the patients History and Physical, chart, labs and discussed the procedure including the risks, benefits and alternatives for the proposed anesthesia with the patient or authorized representative who has indicated his/her understanding and acceptance.   Dental advisory given  Plan Discussed with: CRNA and Anesthesiologist  Anesthesia Plan Comments:        Anesthesia Quick Evaluation

## 2018-04-13 NOTE — Interval H&P Note (Signed)
History and Physical Interval Note:  04/13/2018 11:37 AM  Claire Guzman  has presented today for surgery, with the diagnosis of RIGHT BREAST CANCER  The various methods of treatment have been discussed with the patient and family. After consideration of risks, benefits and other options for treatment, the patient has consented to  Procedure(s): INSERTION PORT-A-CATH WITH [place] (N/A) as a surgical intervention .  The patient's history has been reviewed, patient examined, no change in status, stable for surgery.  I have reviewed the patient's chart and labs.  Questions were answered to the patient's satisfaction.     Adin Hector

## 2018-04-13 NOTE — Transfer of Care (Signed)
Immediate Anesthesia Transfer of Care Note  Patient: Claire Guzman  Procedure(s) Performed: INSERTION PORT-A-CATH WITH [place] (Left Chest)  Patient Location: PACU  Anesthesia Type:General  Level of Consciousness: drowsy and patient cooperative  Airway & Oxygen Therapy: Patient Spontanous Breathing and Patient connected to face mask oxygen  Post-op Assessment: Report given to RN and Post -op Vital signs reviewed and stable  Post vital signs: Reviewed and stable  Last Vitals:  Vitals Value Taken Time  BP 138/77 04/13/2018 12:54 PM  Temp    Pulse 89 04/13/2018 12:55 PM  Resp 15 04/13/2018 12:55 PM  SpO2 100 % 04/13/2018 12:55 PM  Vitals shown include unvalidated device data.  Last Pain:  Vitals:   04/13/18 1114  TempSrc: Oral         Complications: No apparent anesthesia complications

## 2018-04-13 NOTE — Op Note (Signed)
Patient Name:           Claire Guzman   Date of Surgery:        04/13/2018  Pre op Diagnosis:      Cancer right breast  Post op Diagnosis:    Cancer right breast  Procedure:                 Insertion PowerPort [place] 8 French tunneled venous vascular access device                                      Use of fluoroscopy for guidance and positioning  Surgeon:                     Edsel Petrin. Dalbert Batman, M.D., FACS  Assistant:                      OR staff  Operative Indications: This is a 52 year old female with recently diagnosed cancer of the right breast.  Dr. Burr Medico and Dr. Isidore Moos are involved in her care. She was referred by Dr. Miquel Dunn with the [facility] for evaluation and management of locally advanced cancer of the right breast. Freda Munro is her gynecologist. Dr. Mechele Collin is her PCP.        She and Dr. Ouida Sills felt a lump on the right side. She went for bilateral mammograms which were category 1. That did not correlate and so she had an ultrasound which showed a 2.1 cm mass in the right breast, 2:30 position, 1 cm for the next nipple, far posterior area. There was also a suspicious lymph node. 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 Positive lymphovascular invasion. The lymph node also had metastatic breast cancer. ER 90%. PR 5%, Ki-67 60%, HER-2 positive.       No breast or ovarian cancer in the family. Her tumor feels to be almost 3 or 4 cm in size and is very central just at the areolar margin right breast upper outer. It does not appear to be fixed to the chest wall or the skin. However, Her breasts are small. At this time we could not do a lumpectomy. Because of the size of the tumor and the high-grade nature of the tumor I have advised neoadjuvant chemotherapy to be followed in a few months by definitive breast surgery. Most likely this will be a mastectomy but I will leave the door open for breast conservation.  Following neoadjuvant therapy her axilla can be managed with a targeted axillary dissection.       Genetic testing is negative       She has already had one cycle of chemotherapy.       There were some satellite lesions that were going to be biopsied but she decided on mastectomy so no further biopsies are going to be done. I have told her that she will need a Port-A-Cath and a discussed the indications details techniques and risks of that with her and her family.  Operative Findings:       The port was placed through the left subclavian vein without difficulty.  C arm imaging following the procedure showed good positioning of the catheter tip in the superior vena cava near the right atrial junction.  The port and catheter flushed easily and had blood excellent blood return.  Procedure in  Detail:          Following the induction of general LMA anesthesia the patient was positioned with a small roll behind her shoulders and her arms tucked at her sides.  The neck and chest was prepped and draped in a sterile fashion.  Intravenous antibiotics were given.  Surgical timeout was performed.  1% Xylocaine with epinephrine was used as a local infiltration anesthetic.     A left subclavian venipuncture was performed.  Blood return was obtained with a single pass.  The wire threaded easily.  A small incision was made at the wire insertion site.  Using fluoroscopy the wire was seen to be in the superior vena cava in good position.  A transverse incision was made below the midpoint of the clavicle.  A subcutaneous pocket was created.  The catheter was passed from the wire insertion site to the port pocket site using a tunneling device.  I drew a template on the chest wall using fluoroscopy as a guide.  Using this template I measured the catheter and cut it 24 cm in length.  The catheter was secured to the port with the locking device and the port and catheter flushed with concentrated heparin.  The port was sutured to  the pectoralis fascia with Prolene sutures.  The dilator and peel-away sheath was inserted over the guidewire without difficulty.  The wire and dilator were removed.  The catheter was threaded through the peel-away sheath and the peel-away sheath removed.  I had excellent blood return and the catheter flushed well.  Fluoroscopy was performed and showed good positioning of the catheter through the subclavian vein and down into the superior vena cava near the right atrial junction.  There was no deformity of the catheter anywhere along its course.  I had excellent blood return.  The port and catheter was flushed with concentrated heparin.  The subcutaneous tissue was closed with 3-0 Vicryl sutures and the skin incisions closed with subcuticular 4-0 Monocryl and Dermabond.  The patient tolerated the procedure well and was taken to PACU in stable condition.  EBL 10 cc.  Counts correct.  Complications none.    Addendum: I logged onto the [employer] and reviewed her prescription medication history.     Edsel Petrin. Dalbert Batman, M.D., FACS General and Minimally Invasive Surgery Breast and Colorectal Surgery  04/13/2018 12:54 PM

## 2018-04-13 NOTE — Discharge Instructions (Signed)
PORT-A-CATH: POST OP INSTRUCTIONS  Always review your discharge instruction sheet given to you by the facility where your surgery was performed.   1. A prescription for pain medication may be given to you upon discharge. Take your pain medication as prescribed, if needed. If narcotic pain medicine is not needed, then you make take acetaminophen (Tylenol) or ibuprofen (Advil) as needed.  2. Take your usually prescribed medications unless otherwise directed. 3. If you need a refill on your pain medication, please contact our office. All narcotic pain medicine now requires a paper prescription.  Phoned in and fax refills are no longer allowed by law.  Prescriptions will not be filled after 5 pm or on weekends.  4. You should follow a light diet for the remainder of the day after your procedure. 5. Most patients will experience some mild swelling and/or bruising in the area of the incision. It may take several days to resolve. 6. It is common to experience some constipation if taking pain medication after surgery. Increasing fluid intake and taking a stool softener (such as Colace) will usually help or prevent this problem from occurring. A mild laxative (Milk of Magnesia or Miralax) should be taken according to package directions if there are no bowel movements after 48 hours.  7. Unless discharge instructions indicate otherwise, you may remove your bandages 48 hours after surgery, and you may shower at that time. You may have steri-strips (small white skin tapes) in place directly over the incision.  These strips should be left on the skin for 7-10 days.  If your surgeon used Dermabond (skin glue) on the incision, you may shower in 24 hours.  The glue will flake off over the next 2-3 weeks.  8. If your port is left accessed at the end of surgery (needle left in port), the dressing cannot get wet and should only by changed by a healthcare professional. When the port is no longer accessed (when the  needle has been removed), follow step 7.   9. ACTIVITIES:  Limit activity involving your arms for the next 72 hours. Do no strenuous exercise or activity for 1 week. You may drive when you are no longer taking prescription pain medication, you can comfortably wear a seatbelt, and you can maneuver your car. 10.You may need to see your doctor in the office for a follow-up appointment.  Please       check with your doctor.  11.When you receive a new Port-a-Cath, you will get a product guide and        ID card.  Please keep them in case you need them.  WHEN TO CALL YOUR DOCTOR [phone]): 1. Fever over 101.0 2. Chills 3. Continued bleeding from incision 4. Increased redness and tenderness at the site 5. Shortness of breath, difficulty breathing   The clinic staff is available to answer your questions during regular business hours. Please dont hesitate to call and ask to speak to one of the nurses or medical assistants for clinical concerns. If you have a medical emergency, go to the nearest emergency room or call 911.  A surgeon from [facility] is always on call at the hospital.     For further information, please visit [email or web address]    Call Dr. Darrel Hoover office and speak to his nurse.  She will schedule you for a preop a plastic surgical consultation.    Post Anesthesia Home Care Instructions  No TYLENOL until after 530pm 04/13/18.  Activity: Get plenty  of rest for the remainder of the day. A responsible individual must stay with you for 24 hours following the procedure.  For the next 24 hours, DO NOT: -Drive a car -Paediatric nurse -Drink alcoholic beverages -Take any medication unless instructed by your physician -Make any legal decisions or sign important papers.  Meals: Start with liquid foods such as gelatin or soup. Progress to regular foods as tolerated. Avoid greasy, spicy, heavy foods. If nausea and/or vomiting occur, drink only clear  liquids until the nausea and/or vomiting subsides. Call your physician if vomiting continues.  Special Instructions/Symptoms: Your throat may feel dry or sore from the anesthesia or the breathing tube placed in your throat during surgery. If this causes discomfort, gargle with warm salt water. The discomfort should disappear within 24 hours.  If you had a scopolamine patch placed behind your ear for the management of post- operative nausea and/or vomiting:  1. The medication in the patch is effective for 72 hours, after which it should be removed.  Wrap patch in a tissue and discard in the trash. Wash hands thoroughly with soap and water. 2. You may remove the patch earlier than 72 hours if you experience unpleasant side effects which may include dry mouth, dizziness or visual disturbances. 3. Avoid touching the patch. Wash your hands with soap and water after contact with the patch.

## 2018-04-13 NOTE — Anesthesia Procedure Notes (Signed)
Procedure Name: LMA Insertion Date/Time: 04/13/2018 12:10 PM Performed by: Signe Colt, CRNA Pre-anesthesia Checklist: Patient identified, Emergency Drugs available, Suction available and Patient being monitored Patient Re-evaluated:Patient Re-evaluated prior to induction Oxygen Delivery Method: Circle system utilized Preoxygenation: Pre-oxygenation with 100% oxygen Induction Type: IV induction Ventilation: Mask ventilation without difficulty LMA: LMA inserted LMA Size: 4.0 Number of attempts: 1 Airway Equipment and Method: Bite block Placement Confirmation: positive ETCO2 Tube secured with: Tape Dental Injury: Teeth and Oropharynx as per pre-operative assessment

## 2018-04-13 NOTE — Anesthesia Postprocedure Evaluation (Signed)
Anesthesia Post Note  Patient: Claire Guzman  Procedure(s) Performed: INSERTION PORT-A-CATH WITH [place] (Left Chest)     Patient location during evaluation: PACU Anesthesia Type: General Level of consciousness: awake and alert and oriented Pain management: pain level controlled Vital Signs Assessment: post-procedure vital signs reviewed and stable Respiratory status: spontaneous breathing, nonlabored ventilation and respiratory function stable Cardiovascular status: blood pressure returned to baseline and stable Postop Assessment: no apparent nausea or vomiting Anesthetic complications: no    Last Vitals:  Vitals:   04/13/18 1315 04/13/18 1330  BP: 137/73 (!) 144/103  Pulse: 75 80  Resp: 11 12  Temp:    SpO2: 100% 99%    Last Pain:  Vitals:   04/13/18 1300  TempSrc:   PainSc: 0-No pain                 Dorene Bruni A.

## 2018-04-15 ENCOUNTER — Encounter (HOSPITAL_BASED_OUTPATIENT_CLINIC_OR_DEPARTMENT_OTHER): Payer: Self-pay | Admitting: General Surgery

## 2018-04-17 ENCOUNTER — Other Ambulatory Visit: Payer: Self-pay | Admitting: Hematology

## 2018-04-17 DIAGNOSIS — Z170 Estrogen receptor positive status [ER+]: Secondary | ICD-10-CM

## 2018-04-17 DIAGNOSIS — C50211 Malignant neoplasm of upper-inner quadrant of right female breast: Secondary | ICD-10-CM

## 2018-04-17 NOTE — Progress Notes (Signed)
[place]   Telephone:([phone]) [phone] [phone]) [phone]   Clinic Follow Up Note   Patient Care Team: Lujean Amel, MD as PCP - General (Family Medicine) Fanny Skates, MD as Consulting Physician (General Surgery) Truitt Merle, MD as Consulting Physician (Hematology) Eppie Gibson, MD as Attending Physician (Radiation Oncology)   Date of Service:  04/21/2018  CHIEF COMPLAINTS:  F/u for stage IB breast cancer  Oncology History   Cancer Staging Malignant neoplasm of upper-inner quadrant of right breast in female, estrogen receptor positive (Early) Staging form: Breast, AJCC 8th Edition - Clinical stage from 03/08/2018: Stage IB (cT2, cN1, cM0, G3, ER+, PR+, HER2+) - Signed by Truitt Merle, MD on 03/16/2018       Malignant neoplasm of upper-inner quadrant of right breast in female, estrogen receptor positive ([place])   02/24/2018 Mammogram    screening mammogram on 02/24/2018 that was benign    03/04/2018 Mammogram    diagnostic mammogram on 03/04/2018 due to a palpable mass on her right breast. Diagnostic mammogram, breast US revealed and biopsy revealed suspicious mass in the right breast at 2:30 at the palpable site of concern and one suspicious right axillary LN.    03/08/2018 Cancer Staging    Staging form: Breast, AJCC 8th Edition - Clinical stage from 03/08/2018: Stage IB (cT2, cN1, cM0, G3, ER+, PR+, HER2+) - Signed by Truitt Merle, MD on 03/16/2018    03/08/2018 Receptors her2    Her2 positive, ER 90% PR 5% and Ki67 60%    03/08/2018 Pathology Results    Diagnosis 1. Breast, right, needle core biopsy, 2:30 - INVASIVE DUCTAL CARCINOMA, GRADE 3. - LYMPHOVASCULAR SPACE INVOLVEMENT BY TUMOR. 2. Lymph node, needle/core biopsy, right axilla - METASTATIC CARCINOMA INVOLVING SCANT LYMPHOID TISSUE.    03/10/2018 Initial Diagnosis    Malignant neoplasm of upper-inner quadrant of right breast in female, estrogen receptor positive ([place])    03/20/2018 Imaging    MRI brain  03/20/18 IMPRESSION: No evidence of metastatic disease.  Normal appearance of brain. Slightly heterogeneous marrow pattern of the upper cervical spine, nonspecific but likely benign, especially in the setting of early stage disease.     03/24/2018 Echocardiogram    Baseline ECHO 03/24/18 Study Conclusions - Left ventricle: The cavity size was normal. Wall thickness was   increased in a pattern of mild LVH. Systolic function was normal.   The estimated ejection fraction was in the range of 60% to 65%.   Wall motion was normal; there were no regional wall motion   abnormalities. Doppler parameters are consistent with abnormal   left ventricular relaxation (grade 1 diastolic dysfunction). - Right ventricle: The cavity size was mildly dilated. Wall   thickness was normal.    03/25/2018 Imaging    MRI Breast B/l 03/25/18 IMPRESSION: 1. The patient's primary malignancy in the posterior right breast measures 3.2 x 2 x 2.6 cm with apparent invasion of the underlying pectoralis muscle. Numerous surrounding abnormal enhancing masses, consistent with satellite lesions, extend from 11 o'clock in the right breast into the inferolateral right breast with a total span of suspected disease measuring 4.5 x 4.6 x 6.4 cm in AP, transverse, and craniocaudal dimension. The suspected extent of disease involves the superior and inferolateral quadrants. The primary malignancy also extends just across midline into the superior medial quadrant. 2. No MRI evidence of malignancy in the left breast. 3. Known metastatic noted in the right axilla.    03/25/2018 Imaging    Whole body bone scan 03/25/18 IMPRESSION:  No scintigraphic evidence skeletal metastasis.    03/25/2018 Imaging    CT CAP W Contrast 03/25/18 IMPRESSION: 1. Mass within the deep aspect of the right breast along the pectoralis muscle compatible with primary breast malignancy. 2. There is suggestion of two additional possible masses within  the lateral aspect of the right breast versus nodular appearing breast tissue. Recommend dedicated evaluation with bilateral breast MRI. 3. Mildly thickened right axillary lymph node compatible with metastatic adenopathy, recently biopsied. 4. Multiple bilateral pulmonary nodules are indeterminate, potentially sequelae of prior infectious/inflammatory process. Recommend attention on follow-up. Consider follow-up CT in 6 months. 5. Portal venous gas is demonstrated within the left hepatic lobe. Additionally, there is wall thickening of the cecum and ascending colon with small amount of gas within the pericolonic vasculature. Constellation of findings is indeterminate in etiology however may be secondary to colitis at this location with considerations including infectious, inflammatory or ischemic etiologies. Correlate for recent procedure. If patient is not up-to-date for colonic screening, recommend further evaluation with colonoscopy to exclude the possibility of colonic mass within the cecum/ascending colon. 6. These results were called by telephone at the time of interpretation on 03/25/2018 at 9:31 am to Dr. Truitt Merle , who verbally acknowledged these results.    03/30/2018 -  Chemotherapy    Neoadjuvant TCHP every 3 weeks starting 03/30/18     Genetic Testing    Negative genetic testing on the [facility] Common Hereditary Cancers Panel. The Common Hereditary Cancers Panel offered by [facility] includes sequencing and/or deletion duplication testing of the following 47 genes: APC, ATM, AXIN2, BARD1, BMPR1A, BRCA1, BRCA2, BRIP1, CDH1, CDKN2A (p14ARF), CDKN2A (p16INK4a), CKD4, CHEK2, CTNNA1, DICER1, EPCAM (Deletion/duplication testing only), GREM1 (promoter region deletion/duplication testing only), KIT, MEN1, MLH1, MSH2, MSH3, MSH6, MUTYH, NBN, NF1, NHTL1, PALB2, PDGFRA, PMS2, POLD1, POLE, PTEN, RAD50, RAD51C, RAD51D, SDHB, SDHC, SDHD, SMAD4, SMARCA4. STK11, TP53, TSC1, TSC2, and VHL.  The following  genes were evaluated for sequence changes only: SDHA and HOXB13 c.251G>A variant only.   Genetic testing did detect a Variant of Unknown Significance (VUS) in the POLD1 gene called c.985C>T (p.Pro329Ser). At this time, it is unknown if this variant is associated with increased cancer risk or if this is a normal finding, but most variants such as this get reclassified to being inconsequential. It should not be used to make medical management decisions.   The report date is 04/09/2018.     HISTORY OF PRESENTING ILLNESS:  Claire Guzman 52 y.o. female is here because of newly diagnosed stage IB breast cancer. She had a screening mammogram on 02/24/2018 that was benign. She later had a diagnostic mammogram on 03/04/2018 due to a palpable mass on her right breast. Diagnostic mammogram, breast US revealed and biopsy revealed suspicious mass in the right breast at 2:30 at the palpable site of concern and one suspicious right axillary LN. Biopsy revealed invasive breast cancer with metastasis to one LN.  Today, she is here with her family members. She is feeling well. She complains of smelling smoke and ammonia at home. She denies having sinus problems. She also complains of dizziness that's been there for some time. She denies headaches, or change in vision. She has tinnitus, that she thinks is getting worse.  She denies new breast symptoms. She denies pain or weight changes. No abdmonial pain or discomfort.  She has HTN for which she takes medications. She takes Effexor for depression and anxiety.   Grandfather had colon cancer. Father had prostate. Mother had  melanoma. Uncles had colon cancer.  She works in Press [employer].    GYN HISTORY  Menarchal: 15 LMP: 02/15/2018, irregular Contraceptive: She is on Levonorgestrel-ethinyl estradiol. HRT: None GP: 1 child, age 27 at first delivery     CURRENT THERAPY: Neoadjuvant TCHP every 3 weeks starting 03/30/18, plan for 6 cycles    INTERVAL HISTORY   Claire Guzman is here for a follow up. Her genetic testing revealed no pathologic mutations. I saw the patient at [facility] with Dr. Dalbert Batman and Dr. Isidore Moos on 04/09/2018. Today, she is here at the infusion room with her family member.  She feels burning at the port site. She wants a lower dose as she thinks she cannot handle higher doses. She states that her skin was peeling at her chest, back, shoulders, scalp and face. She tried antibiotics and hydrocodone cream for relief. She also had generalized malaise and diarrhea. She also started having memory and vision loss.   She can still feel her breast lump. She also complains of having gum pain and bleeding. She reports that she recently had an episode dysuria, urinary incontinence, and incomplete emptying of the bladder.  She relates that to some of the drinks she had.  It is becoming hard for her to work full time and she has taken.   MEDICAL HISTORY:  Past Medical History:  Diagnosis Date  . Abdominal pain, unspecified site   . Anxiety state, unspecified   . Cancer [facility])    R breast cancer  . Contact dermatitis and other eczema, due to unspecified cause   . Essential hypertension, benign   . Family history of colon cancer   . Family history of melanoma   . Family history of prostate cancer   . GERD (gastroesophageal reflux disease)   . Hematuria, unspecified   . Hypertension   . Lower back pain   . Mixed hyperlipidemia   . Rosacea   . Unspecified symptom associated with female genital organs     SURGICAL HISTORY: Past Surgical History:  Procedure Laterality Date  . CESAREAN SECTION    . DIAGNOSTIC LAPAROSCOPY    . LAPAROSCOPIC APPENDECTOMY    . PORTACATH PLACEMENT Left 04/13/2018   Procedure: INSERTION PORT-A-CATH WITH [place];  Surgeon: Fanny Skates, MD;  Location: [facility];  Service: General;  Laterality: Left;  . UTERINE FIBROID SURGERY      SOCIAL HISTORY: Social History   Socioeconomic History  .  Marital status: Married    Spouse name: Not on file  . Number of children: Not on file  . Years of education: Not on file  . Highest education level: Not on file  Occupational History  . Not on file  Social Needs  . Financial resource strain: Not on file  . Food insecurity:    Worry: Not on file    Inability: Not on file  . Transportation needs:    Medical: Not on file    Non-medical: Not on file  Tobacco Use  . Smoking status: Never Smoker  . Smokeless tobacco: Never Used  Substance and Sexual Activity  . Alcohol use: No  . Drug use: Never  . Sexual activity: Not on file  Lifestyle  . Physical activity:    Days per week: Not on file    Minutes per session: Not on file  . Stress: Not on file  Relationships  . Social connections:    Talks on phone: Not on file    Gets together: Not on  file    Attends religious service: Not on file    Active member of club or organization: Not on file    Attends meetings of clubs or organizations: Not on file    Relationship status: Not on file  . Intimate partner violence:    Fear of current or ex partner: Not on file    Emotionally abused: Not on file    Physically abused: Not on file    Forced sexual activity: Not on file  Other Topics Concern  . Not on file  Social History Narrative  . Not on file    FAMILY HISTORY: Family History  Problem Relation Age of Onset  . Heart failure Mother   . Melanoma Mother 75       has had several removed over the past ten years  . Prostate cancer Father 69  . Heart attack Maternal Grandmother   . Heart attack Maternal Grandfather        possible cancer, unknown type  . Stroke Paternal Grandmother   . Colon cancer Paternal Grandfather        dx upper 22s  . Hypertension Unknown   . Heart Problems Maternal Aunt        d.60  . Heart Problems Maternal Uncle        d.60  . Colon cancer Paternal Aunt        dx 14s  . Colon cancer Paternal Uncle        dx 68s    ALLERGIES:  is allergic  to metrogel [metronidazole]; other; paxil [paroxetine hcl]; prednisone; tramadol; and zomig [zolmitriptan].  MEDICATIONS:  Current Outpatient Medications  Medication Sig Dispense Refill  . ALPRAZolam (XANAX) 0.5 MG tablet Take 1 tablet (0.5 mg total) by mouth at bedtime as needed for anxiety. 30 tablet 0  . atenolol (TENORMIN) 50 MG tablet Take 50 mg by mouth at bedtime.    Marland Kitchen dexamethasone (DECADRON) 4 MG tablet Take 1 tablet (4 mg total) by mouth 2 (two) times daily. Start the day before Taxotere. Take once the day after, then 2 times a day x 2d. 30 tablet 1  . doxycycline (VIBRA-TABS) 100 MG tablet Take 1 tablet (100 mg total) by mouth 2 (two) times daily. 60 tablet 0  . HYDROcodone-acetaminophen (NORCO) 5-325 MG tablet Take 1-2 tablets by mouth every 6 (six) hours as needed for moderate pain or severe pain. 20 tablet 0  . HYDROcodone-acetaminophen (NORCO/VICODIN) 5-325 MG tablet Take 1 tablet by mouth every 6 (six) hours as needed for severe pain. (Patient not taking: Reported on 04/05/2018) 20 tablet 0  . lidocaine-prilocaine (EMLA) cream Apply to affected area once 30 g 3  . omeprazole (PRILOSEC) 20 MG capsule Take 20 mg by mouth daily.    . ondansetron (ZOFRAN) 8 MG tablet Take 1 tablet (8 mg total) by mouth 2 (two) times daily as needed (Nausea or vomiting). Begin 4 days after chemotherapy. 30 tablet 1  . potassium chloride SA (K-DUR,KLOR-CON) 20 MEQ tablet Take 1 tablet (20 mEq total) by mouth 2 (two) times daily. 30 tablet 1  . prochlorperazine (COMPAZINE) 10 MG tablet TAKE 1 TABLET BY MOUTH EVERY 6 HOURS AS NEEDED FOR NAUSEA OR VOMITING 30 tablet 0  . traMADol (ULTRAM) 50 MG tablet Take 1 tablet (50 mg total) by mouth every 12 (twelve) hours as needed. 30 tablet 0  . triamcinolone ointment (KENALOG) 0.5 % Apply 1 application topically 2 (two) times daily. 30 g 1  . venlafaxine (EFFEXOR) 37.5  MG tablet Take 37.5 mg by mouth 2 (two) times daily.     No current facility-administered  medications for this visit.     REVIEW OF SYSTEMS:  Constitutional: Denies fevers, chills or abnormal night sweats (+) hair loss Eyes: Denies blurriness of vision, double vision or watery eyes Ears, nose, mouth, throat, and face: (+) mucositis, gum pain and bleeding Respiratory: Denies cough, dyspnea or wheezes Cardiovascular: Denies palpitation, chest discomfort or lower extremity swelling Gastrointestinal: No constipation or blood in stool (+) diarrhea GU: (+) episode dysuria, urinary incontinence, and incomplete emptying of the bladder.  Skin: (+) skin rash on back, shoulders, scalp, and face, near resolved now  Lymphatics: Denies new lymphadenopathy or easy bruising Neurological:Denies numbness, tingling or new weaknesses Breast: (+) palpable mass in her right breast MSK: No new joint pain or myalgia Behavioral/Psych: Mood is stable, no new changes  All other systems were reviewed with the patient and are negative.  PHYSICAL EXAMINATION:  ECOG PERFORMANCE STATUS: 1 Initals: BP: 146/73 Pulse 71 RR 18 GENERAL:alert, no distress and comfortable SKIN: skin color, texture, turgor are normal, (+) mild diffuse skin erythema on her back, shoulder, upper chest, no other significant skin rashes. EYES: normal, conjunctiva are pink and non-injected, sclera clear OROPHARYNX:no exudate, no erythema and lips, buccal mucosa, and tongue normal  NECK: supple, thyroid normal size, non-tender, without nodularity LYMPH:  no palpable lymphadenopathy in the cervical or inguinal  LUNGS: clear to auscultation and percussion with normal breathing effort HEART: regular rate & rhythm and no murmurs and no lower extremity edema ABDOMEN:abdomen soft, non-tender and normal bowel sounds Musculoskeletal:no cyanosis of digits and no clubbing  Breast: deferred today PSYCH: alert & oriented x 3 with fluent speech NEURO: no focal motor/sensory deficits  LABORATORY DATA:  I have reviewed the data as listed CBC  Latest Ref Rng & Units 04/21/2018 04/05/2018 03/30/2018  WBC 3.9 - 10.3 K/uL 6.9 2.7(L) 13.2(H)  Hemoglobin 11.6 - 15.9 g/dL 12.8 13.5 13.9  Hematocrit 34.8 - 46.6 % 36.8 37.9 39.6  Platelets 145 - 400 K/uL 225 164 236    CMP Latest Ref Rng & Units 04/21/2018 04/09/2018 04/05/2018  Glucose 70 - 99 mg/dL 124(H) 95 101(H)  BUN 6 - 20 mg/dL '10 9 10  '$ Creatinine 0.44 - 1.00 mg/dL 0.70 0.66 0.70  Sodium 135 - 145 mmol/L 143 140 140  Potassium 3.5 - 5.1 mmol/L 3.2(L) 3.7 3.3(L)  Chloride 98 - 111 mmol/L 109 102 100  CO2 22 - 32 mmol/L '22 27 30  '$ Calcium 8.9 - 10.3 mg/dL 9.2 9.0 9.5  Total Protein 6.5 - 8.1 g/dL 7.0 - 6.9  Total Bilirubin 0.3 - 1.2 mg/dL 0.5 - 0.7  Alkaline Phos 38 - 126 U/L 109 - 81  AST 15 - 41 U/L 27 - 17  ALT 0 - 44 U/L 28 - 17    PATHOLOGY:   03/17/2018 Molecular Pathology   03/08/2018 Surgical Pathology ADDITIONAL INFORMATION: 1. PROGNOSTIC INDICATORS Results: IMMUNOHISTOCHEMICAL AND MORPHOMETRIC ANALYSIS PERFORMED MANUALLY The tumor cells are positive for Her2 (3+). Estrogen Receptor: 90%, POSITIVE, STRONG STAINING INTENSITY Progesterone Receptor: 5%, POSITIVE, STRONG STAINING INTENSITY Proliferation Marker Ki67: 60% REFERENCE RANGE ESTROGEN RECEPTOR NEGATIVE 0% POSITIVE =>1% REFERENCE RANGE PROGESTERONE RECEPTOR NEGATIVE 0% POSITIVE =>1% All controls stained appropriately Diagnosis 1. Breast, right, needle core biopsy, 2:30 - INVASIVE DUCTAL CARCINOMA, GRADE 3. - LYMPHOVASCULAR SPACE INVOLVEMENT BY TUMOR. 2. Lymph node, needle/core biopsy, right axilla - METASTATIC CARCINOMA INVOLVING SCANT LYMPHOID TISSUE. Synoptic Report 1. Breast prognostic  profile will be performed. Dr. Vic Ripper agrees. Called to [facility] on 03/09/18. (JDP:gt, 03/09/18) Specimen Comment 1. Formalin 1:30 PM; right breast mass 2. Axillary adenopathy Specimen(s) Obtained: 1. Breast, right, needle core biopsy, 2:30 2. Lymph node, needle/core biopsy, right  axilla Specimen Clinical Information 1. R/O IMC 2. R/O axillary metastasis Gross 1. Received in formalin (time in formalin 1:30 p.m., cold ischemic time unknown) are two cores of soft tan yellow tissue measuring 1.3 and 1.8 cm in length and each 0.2 cm in diameter. The specimen is submitted in toto. 2. Received in formalin (time in formalin 1:30 p.m., cold ischemic time unknown) is a 1.3 x 0.2 cm core of soft hemorrhagic tissue and a separate 0.2 cm soft tan yellow tissue fragment. The specimen is submitted in toto. (GP:gt, 03/09/18) Stain(s) used in Diagnosis: The following stain(s) were used in diagnosing the case: ER-ACIS, PR-ACIS, Her2 by IHC, KI-67-ACIS. The control(s) stained appropriately.   PROCEDURES  Baseline ECHO 03/24/18 Study Conclusions - Left ventricle: The cavity size was normal. Wall thickness was   increased in a pattern of mild LVH. Systolic function was normal.   The estimated ejection fraction was in the range of 60% to 65%.   Wall motion was normal; there were no regional wall motion   abnormalities. Doppler parameters are consistent with abnormal   left ventricular relaxation (grade 1 diastolic dysfunction). - Right ventricle: The cavity size was mildly dilated. Wall   thickness was normal.  RADIOGRAPHIC STUDIES: I have personally reviewed the radiological images as listed and agreed with the findings in the report.  MRI Breast B/l 03/25/18 IMPRESSION: 1. The patient's primary malignancy in the posterior right breast measures 3.2 x 2 x 2.6 cm with apparent invasion of the underlying pectoralis muscle. Numerous surrounding abnormal enhancing masses, consistent with satellite lesions, extend from 11 o'clock in the right breast into the inferolateral right breast with a total span of suspected disease measuring 4.5 x 4.6 x 6.4 cm in AP, transverse, and craniocaudal dimension. The suspected extent of disease involves the superior and inferolateral quadrants.  The primary malignancy also extends just across midline into the superior medial quadrant. 2. No MRI evidence of malignancy in the left breast. 3. Known metastatic noted in the right axilla.  Whole body bone scan 03/25/18 IMPRESSION: No scintigraphic evidence skeletal metastasis.  CT CAP W Contrast 03/25/18 IMPRESSION: 1. Mass within the deep aspect of the right breast along the pectoralis muscle compatible with primary breast malignancy. 2. There is suggestion of two additional possible masses within the lateral aspect of the right breast versus nodular appearing breast tissue. Recommend dedicated evaluation with bilateral breast MRI. 3. Mildly thickened right axillary lymph node compatible with metastatic adenopathy, recently biopsied. 4. Multiple bilateral pulmonary nodules are indeterminate, potentially sequelae of prior infectious/inflammatory process. Recommend attention on follow-up. Consider follow-up CT in 6 months. 5. Portal venous gas is demonstrated within the left hepatic lobe. Additionally, there is wall thickening of the cecum and ascending colon with small amount of gas within the pericolonic vasculature. Constellation of findings is indeterminate in etiology however may be secondary to colitis at this location with considerations including infectious, inflammatory or ischemic etiologies. Correlate for recent procedure. If patient is not up-to-date for colonic screening, recommend further evaluation with colonoscopy to exclude the possibility of colonic mass within the cecum/ascending colon. 6. These results were called by telephone at the time of interpretation on 03/25/2018 at 9:31 am to Dr. Truitt Merle , who  verbally acknowledged these results.  MRI brain 03/20/18 IMPRESSION: No evidence of metastatic disease.  Normal appearance of brain. Slightly heterogeneous marrow pattern of the upper cervical spine, nonspecific but likely benign, especially in the setting of  early stage disease.  03/04/2018 Diagnostic Mammogram and Breast US IMPRESSION: 1. There is a suspicious mass in the right breast at 2:30 at the palpable site of concern.  2.  There is 1 suspicious lymph node in the right axilla.  02/24/2018 Screening Mammogram   ASSESSMENT & PLAN:  Claire Guzman is a 52 y.o. lovely female with a history of   1.Malignant neoplasm of upper-inner quadrant of right breast in female, invasive ductal carcinoma, cT2N1M0 stage IB, ER+/PR+/HER2+, G3  -She previously presented with a palpable right breast mass on exam.  She had had a screening mammogram on 02/24/2018 that was negative Breast US revealed a and biopsy revealed a 2.1 cm mass right breast, and one suspicious right axillary LN. -Biopsy revealed invasive breast cancer with metastasis to one LN. -Immunohistochemistry revealed that the tumor is Her2 positive, ER 90% PR 5% and Ki67 60%. -her risk of recurrence is high. I previously recommended neoadjuvant or adjuvant chemotherapy with TCHP (docetaxel, carboplatin, Herceptin and pejeta every 3 weeks for total of 6 weeks, followed by maintenance Herceptin and Perjeta or adjuvant (TDM1). --Her 02/2018 staging scans showed no evidence of distant metastasis, with multiple indeterminate pulmonary nodules, colonic wall thickning, we will follow-up on that.  -her breast MRI showed additional satellite masses spanning 4.5 x 4.6 x 6.4 cm in right breast, left breast is benign. I reviewed this with pt previously.  She is scheduled to have additional right breast mass biopsy this week.  However patient prefers mastectomy anyway, so I canceled her additional biopsy.  Dr. Tito Dine agreed with it.  -Her baseline ECHO from 03/24/18 showed adequate EF at 60-65% -She started neoadjuvant chemo TCHP every 3 weeks on 03/30/18 -She tolerated the first cycle chemotherapy poorly with significant body aches, hip pain, skin rash, soar throat, and indigestion, and severe diarrhea which  improved with Imodium.  She has recovered very well.  We again reviewed the management of the side effects. -Labs reviewed, CBC is WNLs. CMP showed K 3.2.  -Due to her tolerance issue, I will decrease her carboplatin to AUC 5, and continue the current full dose of docetaxel, Herceptin and pejeta -I advised her to use antibiotics and hydro cream for relief. I advised her to use imodium for her diarrhea.  -Lab, flush and f/u with Lacie or me in a week    2.Genetics  -She has no family history of breast cancer, due to her relatively young age of breast cancer, she is a candidate for genetic testing. She expressed interest -Genetic testing performed through [facility]'s Common Hereditary Cancers Panel reported out on 04/12/2018 showed no pathogenic mutations. The Common Hereditary Cancers Panel offered by [facility] includes sequencing and/or deletion duplication testing of the following 47 genes: APC, ATM, AXIN2, BARD1, BMPR1A, BRCA1, BRCA2, BRIP1, CDH1, CDKN2A (p14ARF), CDKN2A (p16INK4a), CKD4, CHEK2, CTNNA1, DICER1, EPCAM (Deletion/duplication testing only), GREM1 (promoter region deletion/duplication testing only), KIT, MEN1, MLH1, MSH2, MSH3, MSH6, MUTYH, NBN, NF1, NHTL1, PALB2, PDGFRA, PMS2, POLD1, POLE, PTEN, RAD50, RAD51C, RAD51D, SDHB, SDHC, SDHD, SMAD4, SMARCA4. STK11, TP53, TSC1, TSC2, and VHL.  The following genes were evaluated for sequence changes only: SDHA and HOXB13 c.251G>A variant only. A variant of uncertain significance (VUS) in a gene called POLD1 was also noted.   3. Anxiety  -  She has history of anxiety, currently on Xanax as needed  4. HTN -She will continue atenolol. We we will monitor her blood pressure closely during the chemotherapy, and adjust her medication as needed.  5. Skin rash -Red rash on her back, upper chest, face and scalp, likely related to Herceptin and pejeta -She denies being in the sun -She tried over-the-counter hydrocortisone cream, did not help much, I called  in doxycycline, which cleared the rash. -She still complains of skin rash and peeling on her back, shoulder, face and scalp. Resolved now    6. Body pain -Secondary to chemotherapy and Neulasta -She will use tramadol and norco as needed for severe pain  7.  Bilateral small lung nodules -Her restaging CT scan showed bilateral multiple small lung nodules, 3 to 4 mm, indeterminate, likely benign.  She is a never smoker -Plan to repeat CT chest without contrast in 6 months  Plan: -I will refill Xanax -She has recovered well from cycle 1 chemotherapy, her labs reviewed, adequate for treatment, will proceed second cycle 2 TCH P today, with reduced couple dose to AUC 5 -Lab, flush and f/u with Lacie or me next Thursday or Friday or 10/7 -cycle 3 chemo in 3 weeks    All questions were answered. The patient knows to call the clinic with any problems, questions or concerns. I spent 20 minutes counseling the patient face to face. The total time spent in the appointment was 25 minutes and more than 50% was on counseling.  Dierdre Searles Dweik am acting as scribe for Dr. Truitt Merle.  I have reviewed the above documentation for accuracy and completeness, and I agree with the above.      Truitt Merle, MD 04/21/2018

## 2018-04-21 ENCOUNTER — Inpatient Hospital Stay: Payer: BLUE CROSS/BLUE SHIELD

## 2018-04-21 ENCOUNTER — Inpatient Hospital Stay (HOSPITAL_BASED_OUTPATIENT_CLINIC_OR_DEPARTMENT_OTHER): Payer: BLUE CROSS/BLUE SHIELD | Admitting: Hematology

## 2018-04-21 ENCOUNTER — Encounter: Payer: Self-pay | Admitting: Hematology

## 2018-04-21 VITALS — BP 146/73 | HR 71 | Temp 98.0°F | Resp 18

## 2018-04-21 DIAGNOSIS — Z5111 Encounter for antineoplastic chemotherapy: Secondary | ICD-10-CM | POA: Diagnosis not present

## 2018-04-21 DIAGNOSIS — C50211 Malignant neoplasm of upper-inner quadrant of right female breast: Secondary | ICD-10-CM

## 2018-04-21 DIAGNOSIS — K1379 Other lesions of oral mucosa: Secondary | ICD-10-CM

## 2018-04-21 DIAGNOSIS — M791 Myalgia, unspecified site: Secondary | ICD-10-CM | POA: Diagnosis not present

## 2018-04-21 DIAGNOSIS — Z5112 Encounter for antineoplastic immunotherapy: Secondary | ICD-10-CM | POA: Diagnosis not present

## 2018-04-21 DIAGNOSIS — I10 Essential (primary) hypertension: Secondary | ICD-10-CM

## 2018-04-21 DIAGNOSIS — R918 Other nonspecific abnormal finding of lung field: Secondary | ICD-10-CM

## 2018-04-21 DIAGNOSIS — Z5189 Encounter for other specified aftercare: Secondary | ICD-10-CM | POA: Diagnosis not present

## 2018-04-21 DIAGNOSIS — Z170 Estrogen receptor positive status [ER+]: Secondary | ICD-10-CM

## 2018-04-21 DIAGNOSIS — F419 Anxiety disorder, unspecified: Secondary | ICD-10-CM

## 2018-04-21 DIAGNOSIS — R21 Rash and other nonspecific skin eruption: Secondary | ICD-10-CM

## 2018-04-21 LAB — CBC WITH DIFFERENTIAL (CANCER CENTER ONLY)
BASOS ABS: 0.1 10*3/uL (ref 0.0–0.1)
BASOS PCT: 1 %
EOS ABS: 0 10*3/uL (ref 0.0–0.5)
EOS PCT: 1 %
HEMATOCRIT: 36.8 % (ref 34.8–46.6)
Hemoglobin: 12.8 g/dL (ref 11.6–15.9)
Lymphocytes Relative: 22 %
Lymphs Abs: 1.5 10*3/uL (ref 0.9–3.3)
MCH: 31.8 pg (ref 25.1–34.0)
MCHC: 34.7 g/dL (ref 31.5–36.0)
MCV: 91.7 fL (ref 79.5–101.0)
Monocytes Absolute: 0.5 10*3/uL (ref 0.1–0.9)
Monocytes Relative: 7 %
NEUTROS ABS: 4.7 10*3/uL (ref 1.5–6.5)
Neutrophils Relative %: 69 %
PLATELETS: 225 10*3/uL (ref 145–400)
RBC: 4.02 MIL/uL (ref 3.70–5.45)
RDW: 12.8 % (ref 11.2–14.5)
WBC Count: 6.9 10*3/uL (ref 3.9–10.3)

## 2018-04-21 LAB — CMP (CANCER CENTER ONLY)
ALBUMIN: 3.8 g/dL (ref 3.5–5.0)
ALT: 28 U/L (ref 0–44)
ANION GAP: 12 (ref 5–15)
AST: 27 U/L (ref 15–41)
Alkaline Phosphatase: 109 U/L (ref 38–126)
BUN: 10 mg/dL (ref 6–20)
CHLORIDE: 109 mmol/L (ref 98–111)
CO2: 22 mmol/L (ref 22–32)
Calcium: 9.2 mg/dL (ref 8.9–10.3)
Creatinine: 0.7 mg/dL (ref 0.44–1.00)
GFR, Est AFR Am: >60 mL/min (ref >60)
GFR, Estimated: >60 mL/min (ref >60)
GLUCOSE: 124 mg/dL — AB (ref 70–99)
POTASSIUM: 3.2 mmol/L — AB (ref 3.5–5.1)
SODIUM: 143 mmol/L (ref 135–145)
Total Bilirubin: 0.5 mg/dL (ref 0.3–1.2)
Total Protein: 7 g/dL (ref 6.5–8.1)

## 2018-04-21 MED ORDER — SODIUM CHLORIDE 0.9 % IV SOLN
Freq: Once | INTRAVENOUS | Status: AC
  Administered 2018-04-21: 10:00:00 via INTRAVENOUS
  Filled 2018-04-21: qty 250

## 2018-04-21 MED ORDER — DIPHENHYDRAMINE HCL 25 MG PO CAPS
50.0000 mg | ORAL_CAPSULE | Freq: Once | ORAL | Status: AC
  Administered 2018-04-21: 50 mg via ORAL

## 2018-04-21 MED ORDER — SODIUM CHLORIDE 0.9% FLUSH
10.0000 mL | INTRAVENOUS | Status: DC | PRN
  Administered 2018-04-21: 10 mL
  Filled 2018-04-21: qty 10

## 2018-04-21 MED ORDER — HEPARIN SOD (PORK) LOCK FLUSH 100 UNIT/ML IV SOLN
500.0000 [IU] | Freq: Once | INTRAVENOUS | Status: AC | PRN
  Administered 2018-04-21: 500 [IU]
  Filled 2018-04-21: qty 5

## 2018-04-21 MED ORDER — DIPHENHYDRAMINE HCL 25 MG PO CAPS
ORAL_CAPSULE | ORAL | Status: AC
  Filled 2018-04-21: qty 2

## 2018-04-21 MED ORDER — PALONOSETRON HCL INJECTION 0.25 MG/5ML
INTRAVENOUS | Status: AC
  Filled 2018-04-21: qty 5

## 2018-04-21 MED ORDER — ACETAMINOPHEN 325 MG PO TABS
ORAL_TABLET | ORAL | Status: AC
  Filled 2018-04-21: qty 2

## 2018-04-21 MED ORDER — PALONOSETRON HCL INJECTION 0.25 MG/5ML
0.2500 mg | Freq: Once | INTRAVENOUS | Status: AC
  Administered 2018-04-21: 0.25 mg via INTRAVENOUS

## 2018-04-21 MED ORDER — SODIUM CHLORIDE 0.9 % IV SOLN
75.0000 mg/m2 | Freq: Once | INTRAVENOUS | Status: AC
  Administered 2018-04-21: 140 mg via INTRAVENOUS
  Filled 2018-04-21: qty 14

## 2018-04-21 MED ORDER — ACETAMINOPHEN 325 MG PO TABS
650.0000 mg | ORAL_TABLET | Freq: Once | ORAL | Status: AC
  Administered 2018-04-21: 650 mg via ORAL

## 2018-04-21 MED ORDER — SODIUM CHLORIDE 0.9 % IV SOLN
Freq: Once | INTRAVENOUS | Status: AC
  Administered 2018-04-21: 11:00:00 via INTRAVENOUS
  Filled 2018-04-21: qty 5

## 2018-04-21 MED ORDER — SODIUM CHLORIDE 0.9 % IV SOLN
420.0000 mg | Freq: Once | INTRAVENOUS | Status: AC
  Administered 2018-04-21: 420 mg via INTRAVENOUS
  Filled 2018-04-21: qty 14

## 2018-04-21 MED ORDER — ALPRAZOLAM 0.5 MG PO TABS: 0.5000 mg | ORAL_TABLET | Freq: Every evening | ORAL | 0 refills | Status: DC | PRN

## 2018-04-21 MED ORDER — TRASTUZUMAB CHEMO 150 MG IV SOLR
450.0000 mg | Freq: Once | INTRAVENOUS | Status: AC
  Administered 2018-04-21: 450 mg via INTRAVENOUS
  Filled 2018-04-21: qty 21.43

## 2018-04-21 MED ORDER — SODIUM CHLORIDE 0.9 % IV SOLN
611.5000 mg | Freq: Once | INTRAVENOUS | Status: AC
  Administered 2018-04-21: 610 mg via INTRAVENOUS
  Filled 2018-04-21: qty 61

## 2018-04-21 NOTE — Progress Notes (Signed)
Pt complained of itchiness in area of tegaderm that is covering her accessed port. Some redness in the area was noted. Pt state she did scratch the area some.  Tegaderm removed. Site cleaned with alcohol swabs and skin prep applied. Opsite applied to area.  Pt declined to have a physician look at skin area, stating she thinks it's because her skin is still healing in the area after her port was place.  Will continue to monitor patient.   Pt with K 3.2. Per Dr. Burr Medico, pt to double current home dose.

## 2018-04-21 NOTE — Patient Instructions (Signed)
[facility] Discharge Instructions for Patients Receiving Chemotherapy  Today you received the following chemotherapy agents Taxol, Carbo, Herceptin, Perjeta  To help prevent nausea and vomiting after your treatment, we encourage you to take your nausea medication as prescribed.   If you develop nausea and vomiting that is not controlled by your nausea medication, call the clinic.   BELOW ARE SYMPTOMS THAT SHOULD BE REPORTED IMMEDIATELY:  *FEVER GREATER THAN 100.5 F  *CHILLS WITH OR WITHOUT FEVER  NAUSEA AND VOMITING THAT IS NOT CONTROLLED WITH YOUR NAUSEA MEDICATION  *UNUSUAL SHORTNESS OF BREATH  *UNUSUAL BRUISING OR BLEEDING  TENDERNESS IN MOUTH AND THROAT WITH OR WITHOUT PRESENCE OF ULCERS  *URINARY PROBLEMS  *BOWEL PROBLEMS  UNUSUAL RASH Items with * indicate a potential emergency and should be followed up as soon as possible.  Feel free to call the clinic should you have any questions or concerns. The clinic phone number is [phone].  Please show the CHEMO ALERT CARD at check-in to the Emergency Department and triage nurse.   

## 2018-04-21 NOTE — Patient Instructions (Signed)
Implanted Port Home Guide An implanted port is a type of central line that is placed under the skin. Central lines are used to provide IV access when treatment or nutrition needs to be given through a person's veins. Implanted ports are used for long-term IV access. An implanted port may be placed because:  You need IV medicine that would be irritating to the small veins in your hands or arms.  You need long-term IV medicines, such as antibiotics.  You need IV nutrition for a long period.  You need frequent blood draws for lab tests.  You need dialysis.  Implanted ports are usually placed in the chest area, but they can also be placed in the upper arm, the abdomen, or the leg. An implanted port has two main parts:  Reservoir. The reservoir is round and will appear as a small, raised area under your skin. The reservoir is the part where a needle is inserted to give medicines or draw blood.  Catheter. The catheter is a thin, flexible tube that extends from the reservoir. The catheter is placed into a large vein. Medicine that is inserted into the reservoir goes into the catheter and then into the vein.  How will I care for my incision site? Do not get the incision site wet. Bathe or shower as directed by your health care provider. How is my port accessed? Special steps must be taken to access the port:  Before the port is accessed, a numbing cream can be placed on the skin. This helps numb the skin over the port site.  Your health care provider uses a sterile technique to access the port. ? Your health care provider must put on a mask and sterile gloves. ? The skin over your port is cleaned carefully with an antiseptic and allowed to dry. ? The port is gently pinched between sterile gloves, and a needle is inserted into the port.  Only "non-coring" port needles should be used to access the port. Once the port is accessed, a blood return should be checked. This helps ensure that the port  is in the vein and is not clogged.  If your port needs to remain accessed for a constant infusion, a clear (transparent) bandage will be placed over the needle site. The bandage and needle will need to be changed every week, or as directed by your health care provider.  Keep the bandage covering the needle clean and dry. Do not get it wet. Follow your health care provider's instructions on how to take a shower or bath while the port is accessed.  If your port does not need to stay accessed, no bandage is needed over the port.  What is flushing? Flushing helps keep the port from getting clogged. Follow your health care provider's instructions on how and when to flush the port. Ports are usually flushed with saline solution or a medicine called heparin. The need for flushing will depend on how the port is used.  If the port is used for intermittent medicines or blood draws, the port will need to be flushed: ? After medicines have been given. ? After blood has been drawn. ? As part of routine maintenance.  If a constant infusion is running, the port may not need to be flushed.  How long will my port stay implanted? The port can stay in for as long as your health care provider thinks it is needed. When it is time for the port to come out, surgery will be   done to remove it. The procedure is similar to the one performed when the port was put in. When should I seek immediate medical care? When you have an implanted port, you should seek immediate medical care if:  You notice a bad smell coming from the incision site.  You have swelling, redness, or drainage at the incision site.  You have more swelling or pain at the port site or the surrounding area.  You have a fever that is not controlled with medicine.  This information is not intended to replace advice given to you by your health care provider. Make sure you discuss any questions you have with your health care provider. Document  Released: 07/14/2005 Document Revised: 12/20/2015 Document Reviewed: 03/21/2013 Elsevier Interactive Patient Education  2017 [employer]  

## 2018-04-21 NOTE — Progress Notes (Signed)
MD keeping Herceptin at std dose. Kennith Center, Pharm.D., CPP 04/21/2018@10 :55 AM

## 2018-04-22 ENCOUNTER — Telehealth: Payer: Self-pay | Admitting: Hematology

## 2018-04-22 NOTE — Telephone Encounter (Signed)
Scheduled appt per 9/25 los - left message for patient with appt date and time.

## 2018-04-23 ENCOUNTER — Inpatient Hospital Stay: Payer: BLUE CROSS/BLUE SHIELD

## 2018-04-23 DIAGNOSIS — Z5189 Encounter for other specified aftercare: Secondary | ICD-10-CM | POA: Diagnosis not present

## 2018-04-23 DIAGNOSIS — C50211 Malignant neoplasm of upper-inner quadrant of right female breast: Secondary | ICD-10-CM | POA: Diagnosis not present

## 2018-04-23 DIAGNOSIS — Z5112 Encounter for antineoplastic immunotherapy: Secondary | ICD-10-CM | POA: Diagnosis not present

## 2018-04-23 DIAGNOSIS — Z5111 Encounter for antineoplastic chemotherapy: Secondary | ICD-10-CM | POA: Diagnosis not present

## 2018-04-23 DIAGNOSIS — Z170 Estrogen receptor positive status [ER+]: Secondary | ICD-10-CM

## 2018-04-23 MED ORDER — PEGFILGRASTIM-CBQV 6 MG/0.6ML ~~LOC~~ SOSY
6.0000 mg | PREFILLED_SYRINGE | Freq: Once | SUBCUTANEOUS | Status: AC
  Administered 2018-04-23: 6 mg via SUBCUTANEOUS

## 2018-04-23 MED ORDER — PEGFILGRASTIM-CBQV 6 MG/0.6ML ~~LOC~~ SOSY
PREFILLED_SYRINGE | SUBCUTANEOUS | Status: AC
  Filled 2018-04-23: qty 0.6

## 2018-04-23 NOTE — Patient Instructions (Signed)
Pegfilgrastim injection What is this medicine? PEGFILGRASTIM (PEG fil gra stim) is a long-acting granulocyte colony-stimulating factor that stimulates the growth of neutrophils, a type of white blood cell important in the body's fight against infection. It is used to reduce the incidence of fever and infection in patients with certain types of cancer who are receiving chemotherapy that affects the bone marrow, and to increase survival after being exposed to high doses of radiation. This medicine may be used for other purposes; ask your health care provider or pharmacist if you have questions. COMMON BRAND NAME(S): Neulasta What should I tell my health care provider before I take this medicine? They need to know if you have any of these conditions: -kidney disease -latex allergy -ongoing radiation therapy -sickle cell disease -skin reactions to acrylic adhesives (On-Body Injector only) -an unusual or allergic reaction to pegfilgrastim, filgrastim, other medicines, foods, dyes, or preservatives -pregnant or trying to get pregnant -breast-feeding How should I use this medicine? This medicine is for injection under the skin. If you get this medicine at home, you will be taught how to prepare and give the pre-filled syringe or how to use the On-body Injector. Refer to the patient Instructions for Use for detailed instructions. Use exactly as directed. Tell your healthcare provider immediately if you suspect that the On-body Injector may not have performed as intended or if you suspect the use of the On-body Injector resulted in a missed or partial dose. It is important that you put your used needles and syringes in a special sharps container. Do not put them in a trash can. If you do not have a sharps container, call your pharmacist or healthcare provider to get one. Talk to your pediatrician regarding the use of this medicine in children. While this drug may be prescribed for selected conditions,  precautions do apply. Overdosage: If you think you have taken too much of this medicine contact a poison control center or emergency room at once. NOTE: This medicine is only for you. Do not share this medicine with others. What if I miss a dose? It is important not to miss your dose. Call your doctor or health care professional if you miss your dose. If you miss a dose due to an On-body Injector failure or leakage, a new dose should be administered as soon as possible using a single prefilled syringe for manual use. What may interact with this medicine? Interactions have not been studied. Give your health care provider a list of all the medicines, herbs, non-prescription drugs, or dietary supplements you use. Also tell them if you smoke, drink alcohol, or use illegal drugs. Some items may interact with your medicine. This list may not describe all possible interactions. Give your health care provider a list of all the medicines, herbs, non-prescription drugs, or dietary supplements you use. Also tell them if you smoke, drink alcohol, or use illegal drugs. Some items may interact with your medicine. What should I watch for while using this medicine? You may need blood work done while you are taking this medicine. If you are going to need a MRI, CT scan, or other procedure, tell your doctor that you are using this medicine (On-Body Injector only). What side effects may I notice from receiving this medicine? Side effects that you should report to your doctor or health care professional as soon as possible: -allergic reactions like skin rash, itching or hives, swelling of the face, lips, or tongue -dizziness -fever -pain, redness, or irritation at site   where injected -pinpoint red spots on the skin -red or dark-brown urine -shortness of breath or breathing problems -stomach or side pain, or pain at the shoulder -swelling -tiredness -trouble passing urine or change in the amount of urine Side  effects that usually do not require medical attention (report to your doctor or health care professional if they continue or are bothersome): -bone pain -muscle pain This list may not describe all possible side effects. Call your doctor for medical advice about side effects. You may report side effects to FDA at 1-800-FDA-1088. Where should I keep my medicine? Keep out of the reach of children. Store pre-filled syringes in a refrigerator between 2 and 8 degrees C (36 and 46 degrees F). Do not freeze. Keep in carton to protect from light. Throw away this medicine if it is left out of the refrigerator for more than 48 hours. Throw away any unused medicine after the expiration date. NOTE: This sheet is a summary. It may not cover all possible information. If you have questions about this medicine, talk to your doctor, pharmacist, or health care provider.  2018 [employer] (2016-07-10 12:58:03)  

## 2018-04-25 ENCOUNTER — Other Ambulatory Visit: Payer: Self-pay | Admitting: Hematology

## 2018-04-26 ENCOUNTER — Ambulatory Visit: Payer: BLUE CROSS/BLUE SHIELD | Admitting: Hematology

## 2018-04-26 ENCOUNTER — Other Ambulatory Visit: Payer: BLUE CROSS/BLUE SHIELD

## 2018-04-27 ENCOUNTER — Ambulatory Visit: Payer: BLUE CROSS/BLUE SHIELD

## 2018-04-27 ENCOUNTER — Telehealth: Payer: Self-pay | Admitting: *Deleted

## 2018-04-27 NOTE — Telephone Encounter (Signed)
Received call from pt stating that her HR was running in high 90's to low 100's & she felt a little faint when she got up to go to to BR.  She is at work.  She was able to check BP there & had her to check sitting = 137/85 & P 101, standing = 128/80, P 107.  She reports eating & drinking some & no diarrhea.  Encouraged to try adding more fluids orally if able to drink & see if this helps but if not or more symptoms to call back.  Discussed with Cira Rue NP & she agreed with above but states to bring in for labs if not better to see if she is anemic.  Pt was in agreement.

## 2018-04-30 NOTE — Progress Notes (Signed)
[place]   Telephone:([phone]) [phone] Fax:(336) [phone]   Clinic Follow Up Note   Patient Care Team: Lujean Amel, MD as PCP - General (Family Medicine) Fanny Skates, MD as Consulting Physician (General Surgery) Truitt Merle, MD as Consulting Physician (Hematology) Eppie Gibson, MD as Attending Physician (Radiation Oncology)   Date of Service:  05/03/2018  CHIEF COMPLAINTS:  F/u for stage IB breast cancer  Oncology History   Cancer Staging Malignant neoplasm of upper-inner quadrant of right breast in female, estrogen receptor positive ([place]) Staging form: Breast, AJCC 8th Edition - Clinical stage from 03/08/2018: Stage IB (cT2, cN1, cM0, G3, ER+, PR+, HER2+) - Signed by Truitt Merle, MD on 03/16/2018       Malignant neoplasm of upper-inner quadrant of right breast in female, estrogen receptor positive ([place])   02/24/2018 Mammogram    screening mammogram on 02/24/2018 that was benign    03/04/2018 Mammogram    diagnostic mammogram on 03/04/2018 due to a palpable mass on her right breast. Diagnostic mammogram, breast US revealed and biopsy revealed suspicious mass in the right breast at 2:30 at the palpable site of concern and one suspicious right axillary LN.    03/08/2018 Cancer Staging    Staging form: Breast, AJCC 8th Edition - Clinical stage from 03/08/2018: Stage IB (cT2, cN1, cM0, G3, ER+, PR+, HER2+) - Signed by Truitt Merle, MD on 03/16/2018    03/08/2018 Receptors her2    Her2 positive, ER 90% PR 5% and Ki67 60%    03/08/2018 Pathology Results    Diagnosis 1. Breast, right, needle core biopsy, 2:30 - INVASIVE DUCTAL CARCINOMA, GRADE 3. - LYMPHOVASCULAR SPACE INVOLVEMENT BY TUMOR. 2. Lymph node, needle/core biopsy, right axilla - METASTATIC CARCINOMA INVOLVING SCANT LYMPHOID TISSUE.    03/10/2018 Initial Diagnosis    Malignant neoplasm of upper-inner quadrant of right breast in female, estrogen receptor positive ([place])    03/20/2018 Imaging    MRI brain  03/20/18 IMPRESSION: No evidence of metastatic disease.  Normal appearance of brain. Slightly heterogeneous marrow pattern of the upper cervical spine, nonspecific but likely benign, especially in the setting of early stage disease.     03/24/2018 Echocardiogram    Baseline ECHO 03/24/18 Study Conclusions - Left ventricle: The cavity size was normal. Wall thickness was   increased in a pattern of mild LVH. Systolic function was normal.   The estimated ejection fraction was in the range of 60% to 65%.   Wall motion was normal; there were no regional wall motion   abnormalities. Doppler parameters are consistent with abnormal   left ventricular relaxation (grade 1 diastolic dysfunction). - Right ventricle: The cavity size was mildly dilated. Wall   thickness was normal.    03/25/2018 Imaging    MRI Breast B/l 03/25/18 IMPRESSION: 1. The patient's primary malignancy in the posterior right breast measures 3.2 x 2 x 2.6 cm with apparent invasion of the underlying pectoralis muscle. Numerous surrounding abnormal enhancing masses, consistent with satellite lesions, extend from 11 o'clock in the right breast into the inferolateral right breast with a total span of suspected disease measuring 4.5 x 4.6 x 6.4 cm in AP, transverse, and craniocaudal dimension. The suspected extent of disease involves the superior and inferolateral quadrants. The primary malignancy also extends just across midline into the superior medial quadrant. 2. No MRI evidence of malignancy in the left breast. 3. Known metastatic noted in the right axilla.    03/25/2018 Imaging    Whole body bone scan 03/25/18 IMPRESSION:  No scintigraphic evidence skeletal metastasis.    03/25/2018 Imaging    CT CAP W Contrast 03/25/18 IMPRESSION: 1. Mass within the deep aspect of the right breast along the pectoralis muscle compatible with primary breast malignancy. 2. There is suggestion of two additional possible masses within  the lateral aspect of the right breast versus nodular appearing breast tissue. Recommend dedicated evaluation with bilateral breast MRI. 3. Mildly thickened right axillary lymph node compatible with metastatic adenopathy, recently biopsied. 4. Multiple bilateral pulmonary nodules are indeterminate, potentially sequelae of prior infectious/inflammatory process. Recommend attention on follow-up. Consider follow-up CT in 6 months. 5. Portal venous gas is demonstrated within the left hepatic lobe. Additionally, there is wall thickening of the cecum and ascending colon with small amount of gas within the pericolonic vasculature. Constellation of findings is indeterminate in etiology however may be secondary to colitis at this location with considerations including infectious, inflammatory or ischemic etiologies. Correlate for recent procedure. If patient is not up-to-date for colonic screening, recommend further evaluation with colonoscopy to exclude the possibility of colonic mass within the cecum/ascending colon. 6. These results were called by telephone at the time of interpretation on 03/25/2018 at 9:31 am to Dr. Truitt Merle , who verbally acknowledged these results.    03/30/2018 -  Chemotherapy    Neoadjuvant TCHP every 3 weeks starting 03/30/18     Genetic Testing    Negative genetic testing on the [facility] Common Hereditary Cancers Panel. The Common Hereditary Cancers Panel offered by [facility] includes sequencing and/or deletion duplication testing of the following 47 genes: APC, ATM, AXIN2, BARD1, BMPR1A, BRCA1, BRCA2, BRIP1, CDH1, CDKN2A (p14ARF), CDKN2A (p16INK4a), CKD4, CHEK2, CTNNA1, DICER1, EPCAM (Deletion/duplication testing only), GREM1 (promoter region deletion/duplication testing only), KIT, MEN1, MLH1, MSH2, MSH3, MSH6, MUTYH, NBN, NF1, NHTL1, PALB2, PDGFRA, PMS2, POLD1, POLE, PTEN, RAD50, RAD51C, RAD51D, SDHB, SDHC, SDHD, SMAD4, SMARCA4. STK11, TP53, TSC1, TSC2, and VHL.  The following  genes were evaluated for sequence changes only: SDHA and HOXB13 c.251G>A variant only.   Genetic testing did detect a Variant of Unknown Significance (VUS) in the POLD1 gene called c.985C>T (p.Pro329Ser). At this time, it is unknown if this variant is associated with increased cancer risk or if this is a normal finding, but most variants such as this get reclassified to being inconsequential. It should not be used to make medical management decisions.   The report date is 04/09/2018.     HISTORY OF PRESENTING ILLNESS:  Claire Guzman 52 y.o. female is here because of newly diagnosed stage IB breast cancer. She had a screening mammogram on 02/24/2018 that was benign. She later had a diagnostic mammogram on 03/04/2018 due to a palpable mass on her right breast. Diagnostic mammogram, breast US revealed and biopsy revealed suspicious mass in the right breast at 2:30 at the palpable site of concern and one suspicious right axillary LN. Biopsy revealed invasive breast cancer with metastasis to one LN.  Today, she is here with her family members. She is feeling well. She complains of smelling smoke and ammonia at home. She denies having sinus problems. She also complains of dizziness that's been there for some time. She denies headaches, or change in vision. She has tinnitus, that she thinks is getting worse.  She denies new breast symptoms. She denies pain or weight changes. No abdmonial pain or discomfort.  She has HTN for which she takes medications. She takes Effexor for depression and anxiety.   Grandfather had colon cancer. Father had prostate. Mother had  melanoma. Uncles had colon cancer.  She works in Press [employer].    GYN HISTORY  Menarchal: 15 LMP: 02/15/2018, irregular Contraceptive: She is on Levonorgestrel-ethinyl estradiol. HRT: None GP: 1 child, age 40 at first delivery     CURRENT THERAPY: Neoadjuvant TCHP every 3 weeks starting 03/30/18, plan for 6 cycles, carbo dose reduction from cycle  2 due to poor tolerance    INTERVAL HISTORY  Claire Guzman is here for a follow up. She called on 04/27/2018 complaining of feeling faint when she stood up, and she was advised to drink more fluids. She denied diarrhea at that time.  Today, she is here alone at the clinic for follow-up after her second cycle chemo. She feels better now. She had body pain and fatigue after her last injection. She experienced rash and  hand blisters after last chemo. She uses cortisol cream to relief her rash and blisters.  She denies diarrhea, and states that she's been having constipation. She also has nose bleeds.  She also complains of burning sensation in her eyes.   MEDICAL HISTORY:  Past Medical History:  Diagnosis Date  . Abdominal pain, unspecified site   . Anxiety state, unspecified   . Cancer [facility])    R breast cancer  . Contact dermatitis and other eczema, due to unspecified cause   . Essential hypertension, benign   . Family history of colon cancer   . Family history of melanoma   . Family history of prostate cancer   . GERD (gastroesophageal reflux disease)   . Hematuria, unspecified   . Hypertension   . Lower back pain   . Mixed hyperlipidemia   . Rosacea   . Unspecified symptom associated with female genital organs     SURGICAL HISTORY: Past Surgical History:  Procedure Laterality Date  . CESAREAN SECTION    . DIAGNOSTIC LAPAROSCOPY    . LAPAROSCOPIC APPENDECTOMY    . PORTACATH PLACEMENT Left 04/13/2018   Procedure: INSERTION PORT-A-CATH WITH [place];  Surgeon: Fanny Skates, MD;  Location: [facility];  Service: General;  Laterality: Left;  . UTERINE FIBROID SURGERY      SOCIAL HISTORY: Social History   Socioeconomic History  . Marital status: Married    Spouse name: Not on file  . Number of children: Not on file  . Years of education: Not on file  . Highest education level: Not on file  Occupational History  . Not on file  Social Needs  . Financial  resource strain: Not on file  . Food insecurity:    Worry: Not on file    Inability: Not on file  . Transportation needs:    Medical: Not on file    Non-medical: Not on file  Tobacco Use  . Smoking status: Never Smoker  . Smokeless tobacco: Never Used  Substance and Sexual Activity  . Alcohol use: No  . Drug use: Never  . Sexual activity: Not on file  Lifestyle  . Physical activity:    Days per week: Not on file    Minutes per session: Not on file  . Stress: Not on file  Relationships  . Social connections:    Talks on phone: Not on file    Gets together: Not on file    Attends religious service: Not on file    Active member of club or organization: Not on file    Attends meetings of clubs or organizations: Not on file    Relationship status: Not on file  .  Intimate partner violence:    Fear of current or ex partner: Not on file    Emotionally abused: Not on file    Physically abused: Not on file    Forced sexual activity: Not on file  Other Topics Concern  . Not on file  Social History Narrative  . Not on file    FAMILY HISTORY: Family History  Problem Relation Age of Onset  . Heart failure Mother   . Melanoma Mother 13       has had several removed over the past ten years  . Prostate cancer Father 28  . Heart attack Maternal Grandmother   . Heart attack Maternal Grandfather        possible cancer, unknown type  . Stroke Paternal Grandmother   . Colon cancer Paternal Grandfather        dx upper 57s  . Hypertension Unknown   . Heart Problems Maternal Aunt        d.60  . Heart Problems Maternal Uncle        d.60  . Colon cancer Paternal Aunt        dx 5s  . Colon cancer Paternal Uncle        dx 29s    ALLERGIES:  is allergic to metrogel [metronidazole]; other; paxil [paroxetine hcl]; prednisone; tramadol; and zomig [zolmitriptan].  MEDICATIONS:  Current Outpatient Medications  Medication Sig Dispense Refill  . ALPRAZolam (XANAX) 0.5 MG tablet Take 1  tablet (0.5 mg total) by mouth at bedtime as needed for anxiety. 30 tablet 0  . atenolol (TENORMIN) 50 MG tablet Take 50 mg by mouth at bedtime.    Marland Kitchen dexamethasone (DECADRON) 4 MG tablet Take 1 tablet (4 mg total) by mouth 2 (two) times daily. Start the day before Taxotere. Take once the day after, then 2 times a day x 2d. 30 tablet 1  . doxycycline (VIBRA-TABS) 100 MG tablet Take 1 tablet (100 mg total) by mouth 2 (two) times daily. 60 tablet 0  . HYDROcodone-acetaminophen (NORCO) 5-325 MG tablet Take 1-2 tablets by mouth every 6 (six) hours as needed for moderate pain or severe pain. 20 tablet 0  . HYDROcodone-acetaminophen (NORCO/VICODIN) 5-325 MG tablet Take 1 tablet by mouth every 6 (six) hours as needed for severe pain. 20 tablet 0  . lidocaine-prilocaine (EMLA) cream Apply to affected area once 30 g 3  . omeprazole (PRILOSEC) 20 MG capsule Take 1 capsule (20 mg total) by mouth 2 (two) times daily before a meal. 60 capsule 2  . ondansetron (ZOFRAN) 8 MG tablet Take 1 tablet (8 mg total) by mouth 2 (two) times daily as needed (Nausea or vomiting). Begin 4 days after chemotherapy. 30 tablet 1  . potassium chloride SA (K-DUR,KLOR-CON) 20 MEQ tablet TAKE 1 TABLET(20 MEQ) BY MOUTH TWICE DAILY 30 tablet 0  . prochlorperazine (COMPAZINE) 10 MG tablet TAKE 1 TABLET BY MOUTH EVERY 6 HOURS AS NEEDED FOR NAUSEA OR VOMITING 30 tablet 0  . traMADol (ULTRAM) 50 MG tablet Take 1 tablet (50 mg total) by mouth every 12 (twelve) hours as needed. 30 tablet 0  . triamcinolone ointment (KENALOG) 0.5 % Apply 1 application topically 2 (two) times daily. 30 g 1  . venlafaxine (EFFEXOR) 37.5 MG tablet Take 37.5 mg by mouth 2 (two) times daily.     No current facility-administered medications for this visit.     REVIEW OF SYSTEMS:  Constitutional: Denies fevers, chills or abnormal night sweats (+) hair loss Eyes: Denies blurriness  of vision, double vision or watery eyes (+) burning in her eyes Ears, nose, mouth,  throat, and face: (+) nose bleeds Respiratory: Denies cough, dyspnea or wheezes Cardiovascular: Denies palpitation, chest discomfort or lower extremity swelling Gastrointestinal: No constipation or blood in stool (+) constipation (+) heartburn (+) occasional nausea  GU: No new frequency, urgency, or dysuria Skin: (+) skin rash on back, shoulders, scalp, and face, near resolved now (+) blisters on hands Lymphatics: Denies new lymphadenopathy or easy bruising Neurological:Denies numbness, tingling or new weaknesses Breast: (+) palpable mass in her right breast MSK: No new joint pain or myalgia Behavioral/Psych: Mood is stable, no new changes  All other systems were reviewed with the patient and are negative.  PHYSICAL EXAMINATION:  ECOG PERFORMANCE STATUS: 1 Vitals:   05/03/18 1303  BP: 117/68  Pulse: 79  Resp: 18  Temp: 98.9 F (37.2 C)  TempSrc: Oral  SpO2: 98%  Weight: 162 lb 6.4 oz (73.7 kg)  Height: _0  (1.626 m)    GENERAL:alert, no distress and comfortable SKIN: skin color, texture, turgor are normal, (+) mild diffuse skin erythema on her back, shoulder, upper chest, no other significant skin rashes, improving (+) blisters on bilateral hands  EYES: normal, conjunctiva are pink and non-injected, sclera clear OROPHARYNX:no exudate, no erythema and lips, buccal mucosa, and tongue normal  NECK: supple, thyroid normal size, non-tender, without nodularity LYMPH:  no palpable lymphadenopathy in the cervical or inguinal  LUNGS: clear to auscultation and percussion with normal breathing effort HEART: regular rate & rhythm and no murmurs and no lower extremity edema ABDOMEN:abdomen soft, non-tender and normal bowel sounds Musculoskeletal:no cyanosis of digits and no clubbing  Breast: deferred today PSYCH: alert & oriented x 3 with fluent speech NEURO: no focal motor/sensory deficits  LABORATORY DATA:  I have reviewed the data as listed CBC Latest Ref Rng & Units 05/03/2018  04/21/2018 04/05/2018  WBC 3.9 - 10.3 K/uL 13.3(H) 6.9 2.7(L)  Hemoglobin 11.6 - 15.9 g/dL 11.2(L) 12.8 13.5  Hematocrit 34.8 - 46.6 % 31.9(L) 36.8 37.9  Platelets 145 - 400 K/uL 172 225 164    CMP Latest Ref Rng & Units 05/03/2018 04/21/2018 04/09/2018  Glucose 70 - 99 mg/dL 90 124(H) 95  BUN 6 - 20 mg/dL _1 Creatinine 0.44 - 1.00 mg/dL 0.64 0.70 0.66  Sodium 135 - 145 mmol/L 140 143 140  Potassium 3.5 - 5.1 mmol/L 3.7 3.2(L) 3.7  Chloride 98 - 111 mmol/L 105 109 102  CO2 22 - 32 mmol/L _2 Calcium 8.9 - 10.3 mg/dL 9.1 9.2 9.0  Total Protein 6.5 - 8.1 g/dL 6.8 7.0 -  Total Bilirubin 0.3 - 1.2 mg/dL 0.4 0.5 -  Alkaline Phos 38 - 126 U/L 138(H) 109 -  AST 15 - 41 U/L 23 27 -  ALT 0 - 44 U/L 15 28 -    PATHOLOGY:   03/17/2018 Molecular Pathology   03/08/2018 Surgical Pathology ADDITIONAL INFORMATION: 1. PROGNOSTIC INDICATORS Results: IMMUNOHISTOCHEMICAL AND MORPHOMETRIC ANALYSIS PERFORMED MANUALLY The tumor cells are positive for Her2 (3+). Estrogen Receptor: 90%, POSITIVE, STRONG STAINING INTENSITY Progesterone Receptor: 5%, POSITIVE, STRONG STAINING INTENSITY Proliferation Marker Ki67: 60% REFERENCE RANGE ESTROGEN RECEPTOR NEGATIVE 0% POSITIVE =>1% REFERENCE RANGE PROGESTERONE RECEPTOR NEGATIVE 0% POSITIVE =>1% All controls stained appropriately Diagnosis 1. Breast, right, needle core biopsy, 2:30 - INVASIVE DUCTAL CARCINOMA, GRADE 3. - LYMPHOVASCULAR SPACE INVOLVEMENT BY TUMOR. 2. Lymph node, needle/core biopsy, right axilla - METASTATIC CARCINOMA INVOLVING SCANT LYMPHOID TISSUE. Synoptic  Report 1. Breast prognostic profile will be performed. Dr. Vic Ripper agrees. Called to [facility] on 03/09/18. (JDP:gt, 03/09/18) Specimen Comment 1. Formalin 1:30 PM; right breast mass 2. Axillary adenopathy Specimen(s) Obtained: 1. Breast, right, needle core biopsy, 2:30 2. Lymph node, needle/core biopsy, right axilla Specimen Clinical  Information 1. R/O IMC 2. R/O axillary metastasis Gross 1. Received in formalin (time in formalin 1:30 p.m., cold ischemic time unknown) are two cores of soft tan yellow tissue measuring 1.3 and 1.8 cm in length and each 0.2 cm in diameter. The specimen is submitted in toto. 2. Received in formalin (time in formalin 1:30 p.m., cold ischemic time unknown) is a 1.3 x 0.2 cm core of soft hemorrhagic tissue and a separate 0.2 cm soft tan yellow tissue fragment. The specimen is submitted in toto. (GP:gt, 03/09/18) Stain(s) used in Diagnosis: The following stain(s) were used in diagnosing the case: ER-ACIS, PR-ACIS, Her2 by IHC, KI-67-ACIS. The control(s) stained appropriately.   PROCEDURES  Baseline ECHO 03/24/18 Study Conclusions - Left ventricle: The cavity size was normal. Wall thickness was   increased in a pattern of mild LVH. Systolic function was normal.   The estimated ejection fraction was in the range of 60% to 65%.   Wall motion was normal; there were no regional wall motion   abnormalities. Doppler parameters are consistent with abnormal   left ventricular relaxation (grade 1 diastolic dysfunction). - Right ventricle: The cavity size was mildly dilated. Wall   thickness was normal.  RADIOGRAPHIC STUDIES: I have personally reviewed the radiological images as listed and agreed with the findings in the report.  MRI Breast B/l 03/25/18 IMPRESSION: 1. The patient's primary malignancy in the posterior right breast measures 3.2 x 2 x 2.6 cm with apparent invasion of the underlying pectoralis muscle. Numerous surrounding abnormal enhancing masses, consistent with satellite lesions, extend from 11 o'clock in the right breast into the inferolateral right breast with a total span of suspected disease measuring 4.5 x 4.6 x 6.4 cm in AP, transverse, and craniocaudal dimension. The suspected extent of disease involves the superior and inferolateral quadrants. The primary malignancy also  extends just across midline into the superior medial quadrant. 2. No MRI evidence of malignancy in the left breast. 3. Known metastatic noted in the right axilla.  Whole body bone scan 03/25/18 IMPRESSION: No scintigraphic evidence skeletal metastasis.  CT CAP W Contrast 03/25/18 IMPRESSION: 1. Mass within the deep aspect of the right breast along the pectoralis muscle compatible with primary breast malignancy. 2. There is suggestion of two additional possible masses within the lateral aspect of the right breast versus nodular appearing breast tissue. Recommend dedicated evaluation with bilateral breast MRI. 3. Mildly thickened right axillary lymph node compatible with metastatic adenopathy, recently biopsied. 4. Multiple bilateral pulmonary nodules are indeterminate, potentially sequelae of prior infectious/inflammatory process. Recommend attention on follow-up. Consider follow-up CT in 6 months. 5. Portal venous gas is demonstrated within the left hepatic lobe. Additionally, there is wall thickening of the cecum and ascending colon with small amount of gas within the pericolonic vasculature. Constellation of findings is indeterminate in etiology however may be secondary to colitis at this location with considerations including infectious, inflammatory or ischemic etiologies. Correlate for recent procedure. If patient is not up-to-date for colonic screening, recommend further evaluation with colonoscopy to exclude the possibility of colonic mass within the cecum/ascending colon. 6. These results were called by telephone at the time of interpretation on 03/25/2018 at 9:31 am to Dr.  Truitt Merle , who verbally acknowledged these results.  MRI brain 03/20/18 IMPRESSION: No evidence of metastatic disease.  Normal appearance of brain. Slightly heterogeneous marrow pattern of the upper cervical spine, nonspecific but likely benign, especially in the setting of early stage  disease.  03/04/2018 Diagnostic Mammogram and Breast US IMPRESSION: 1. There is a suspicious mass in the right breast at 2:30 at the palpable site of concern.  2.  There is 1 suspicious lymph node in the right axilla.  02/24/2018 Screening Mammogram   ASSESSMENT & PLAN:  CAMYLLE WHICKER is a 52 y.o. lovely female with a history of   1.Malignant neoplasm of upper-inner quadrant of right breast in female, invasive ductal carcinoma, cT2N1M0 stage IB, ER+/PR+/HER2+, G3  -She previously presented with a palpable right breast mass on exam.  She had had a screening mammogram on 02/24/2018 that was negative Breast US revealed a and biopsy revealed a 2.1 cm mass right breast, and one suspicious right axillary LN. -Biopsy revealed invasive breast cancer with metastasis to one LN. -Immunohistochemistry revealed that the tumor is Her2 positive, ER 90% PR 5% and Ki67 60%. -her risk of recurrence is high. I previously recommended neoadjuvant or adjuvant chemotherapy with TCHP (docetaxel, carboplatin, Herceptin and pejeta every 3 weeks for total of 6 weeks, followed by maintenance Herceptin and Perjeta or adjuvant (TDM1). --Her 02/2018 staging scans showed no evidence of distant metastasis, with multiple indeterminate pulmonary nodules, colonic wall thickning, we will follow-up on that.  -her breast MRI showed additional satellite masses spanning 4.5 x 4.6 x 6.4 cm in right breast, left breast is benign. I reviewed this with pt previously.  She is scheduled to have additional right breast mass biopsy this week.  However patient prefers mastectomy anyway, so I canceled her additional biopsy.  Dr. Dalbert Batman agreed with it.  -Her baseline ECHO from 03/24/18 showed adequate EF at 60-65% -She started neoadjuvant chemo TCHP every 3 weeks on 03/30/18 -She tolerated the first cycle chemotherapy poorly with significant body aches, hip pain, skin rash, soar throat, and indigestion, and severe diarrhea which improved with  Imodium.  She has recovered very well.  We again reviewed the management of the side effects. -Due to her tolerance issue, I previously decreased her carboplatin to AUC 5 from cycle 2, and continue the current full dose of docetaxel, Herceptin and pejeta, she tolerated much better, with body aches for 2 days, mild dehydration, no significant nausea or diarrhea, overall much more tolerable. --Labs reviewed, CBC showed Hg 11.2 WBC 13.3 Platelet 172K. CMP pending.  No need IV fluids today. -Lab and f/u next week for cycle 3 chemo. Will keep the same dose as cycle 2    2.Genetics  -She has no family history of breast cancer, due to her relatively young age of breast cancer, she is a candidate for genetic testing. She expressed interest -Genetic testing performed through [facility]'s Common Hereditary Cancers Panel reported out on 04/12/2018 showed no pathogenic mutations. The Common Hereditary Cancers Panel offered by [facility] includes sequencing and/or deletion duplication testing of the following 47 genes: APC, ATM, AXIN2, BARD1, BMPR1A, BRCA1, BRCA2, BRIP1, CDH1, CDKN2A (p14ARF), CDKN2A (p16INK4a), CKD4, CHEK2, CTNNA1, DICER1, EPCAM (Deletion/duplication testing only), GREM1 (promoter region deletion/duplication testing only), KIT, MEN1, MLH1, MSH2, MSH3, MSH6, MUTYH, NBN, NF1, NHTL1, PALB2, PDGFRA, PMS2, POLD1, POLE, PTEN, RAD50, RAD51C, RAD51D, SDHB, SDHC, SDHD, SMAD4, SMARCA4. STK11, TP53, TSC1, TSC2, and VHL.  The following genes were evaluated for sequence changes only: SDHA and HOXB13 c.251G>A  variant only. A variant of uncertain significance (VUS) in a gene called POLD1 was also noted.   3. Anxiety  -She has history of anxiety, currently on Xanax as needed  4. HTN -She will continue atenolol. We we will monitor her blood pressure closely during the chemotherapy, and adjust her medication as needed.  5. Skin rash -Red rash on her back, upper chest, face and scalp, likely related to Herceptin and  pejeta -She denies being in the sun -She tried over-the-counter hydrocortisone cream, did not help much, I called in doxycycline, which cleared the rash. -Her rash has much improved after cycle 2, she has mild skin edema and peeling on her hands   6. Body pain -Secondary to chemotherapy and Neulasta -She will use tramadol and norco as needed for severe pain -Improved after chemo dose reduction from cycle 2  7.  Bilateral small lung nodules -Her restaging CT scan showed bilateral multiple small lung nodules, 3 to 4 mm, indeterminate, likely benign.  She is a never smoker -Plan to repeat CT chest without contrast in 6 months  Plan: -refilled omeprazole today -f/u in next week before cycle 3 chemo    All questions were answered. The patient knows to call the clinic with any problems, questions or concerns. I spent 15 minutes counseling the patient face to face. The total time spent in the appointment was 20 minutes and more than 50% was on counseling.  Dierdre Searles Dweik am acting as scribe for Dr. Truitt Merle.  I have reviewed the above documentation for accuracy and completeness, and I agree with the above.      Truitt Merle, MD 05/03/2018

## 2018-05-03 ENCOUNTER — Inpatient Hospital Stay: Payer: BLUE CROSS/BLUE SHIELD | Attending: Hematology | Admitting: Hematology

## 2018-05-03 ENCOUNTER — Encounter: Payer: Self-pay | Admitting: Hematology

## 2018-05-03 ENCOUNTER — Inpatient Hospital Stay: Payer: BLUE CROSS/BLUE SHIELD

## 2018-05-03 VITALS — BP 117/68 | HR 79 | Temp 98.9°F | Resp 18 | Ht 64.0 in | Wt 162.4 lb

## 2018-05-03 DIAGNOSIS — Z170 Estrogen receptor positive status [ER+]: Secondary | ICD-10-CM

## 2018-05-03 DIAGNOSIS — R21 Rash and other nonspecific skin eruption: Secondary | ICD-10-CM | POA: Diagnosis not present

## 2018-05-03 DIAGNOSIS — C773 Secondary and unspecified malignant neoplasm of axilla and upper limb lymph nodes: Secondary | ICD-10-CM

## 2018-05-03 DIAGNOSIS — C50211 Malignant neoplasm of upper-inner quadrant of right female breast: Secondary | ICD-10-CM | POA: Diagnosis not present

## 2018-05-03 DIAGNOSIS — R918 Other nonspecific abnormal finding of lung field: Secondary | ICD-10-CM

## 2018-05-03 DIAGNOSIS — I10 Essential (primary) hypertension: Secondary | ICD-10-CM | POA: Diagnosis not present

## 2018-05-03 DIAGNOSIS — R52 Pain, unspecified: Secondary | ICD-10-CM | POA: Diagnosis not present

## 2018-05-03 DIAGNOSIS — Z5112 Encounter for antineoplastic immunotherapy: Secondary | ICD-10-CM | POA: Insufficient documentation

## 2018-05-03 DIAGNOSIS — Z23 Encounter for immunization: Secondary | ICD-10-CM | POA: Insufficient documentation

## 2018-05-03 DIAGNOSIS — Z5189 Encounter for other specified aftercare: Secondary | ICD-10-CM | POA: Insufficient documentation

## 2018-05-03 DIAGNOSIS — R42 Dizziness and giddiness: Secondary | ICD-10-CM

## 2018-05-03 DIAGNOSIS — C9000 Multiple myeloma not having achieved remission: Secondary | ICD-10-CM

## 2018-05-03 DIAGNOSIS — Z5111 Encounter for antineoplastic chemotherapy: Secondary | ICD-10-CM | POA: Diagnosis not present

## 2018-05-03 DIAGNOSIS — F419 Anxiety disorder, unspecified: Secondary | ICD-10-CM

## 2018-05-03 LAB — CMP (CANCER CENTER ONLY)
ALK PHOS: 138 U/L — AB (ref 38–126)
ALT: 15 U/L (ref 0–44)
ANION GAP: 8 (ref 5–15)
AST: 23 U/L (ref 15–41)
Albumin: 3.7 g/dL (ref 3.5–5.0)
BILIRUBIN TOTAL: 0.4 mg/dL (ref 0.3–1.2)
BUN: 8 mg/dL (ref 6–20)
CALCIUM: 9.1 mg/dL (ref 8.9–10.3)
CO2: 27 mmol/L (ref 22–32)
Chloride: 105 mmol/L (ref 98–111)
Creatinine: 0.64 mg/dL (ref 0.44–1.00)
GFR, Estimated: >60 mL/min (ref >60)
Glucose, Bld: 90 mg/dL (ref 70–99)
POTASSIUM: 3.7 mmol/L (ref 3.5–5.1)
SODIUM: 140 mmol/L (ref 135–145)
TOTAL PROTEIN: 6.8 g/dL (ref 6.5–8.1)

## 2018-05-03 LAB — CBC WITH DIFFERENTIAL (CANCER CENTER ONLY)
BASOS ABS: 0.1 10*3/uL (ref 0.0–0.1)
BASOS PCT: 1 %
Eosinophils Absolute: 0 10*3/uL (ref 0.0–0.5)
Eosinophils Relative: 0 %
HEMATOCRIT: 31.9 % — AB (ref 34.8–46.6)
HEMOGLOBIN: 11.2 g/dL — AB (ref 11.6–15.9)
Lymphocytes Relative: 14 %
Lymphs Abs: 1.8 10*3/uL (ref 0.9–3.3)
MCH: 32 pg (ref 25.1–34.0)
MCHC: 35 g/dL (ref 31.5–36.0)
MCV: 91.4 fL (ref 79.5–101.0)
Monocytes Absolute: 0.8 10*3/uL (ref 0.1–0.9)
Monocytes Relative: 6 %
NEUTROS ABS: 10.5 10*3/uL — AB (ref 1.5–6.5)
NEUTROS PCT: 79 %
Platelet Count: 172 10*3/uL (ref 145–400)
RBC: 3.49 MIL/uL — AB (ref 3.70–5.45)
RDW: 13.3 % (ref 11.2–14.5)
WBC Count: 13.3 10*3/uL — ABNORMAL HIGH (ref 3.9–10.3)

## 2018-05-03 MED ORDER — SODIUM CHLORIDE 0.9% FLUSH
10.0000 mL | Freq: Once | INTRAVENOUS | Status: AC
  Administered 2018-05-03: 10 mL via INTRAVENOUS
  Filled 2018-05-03: qty 10

## 2018-05-03 MED ORDER — OMEPRAZOLE 20 MG PO CPDR: 20.0000 mg | DELAYED_RELEASE_CAPSULE | Freq: Two times a day (BID) | ORAL | 2 refills | Status: DC

## 2018-05-03 MED ORDER — HEPARIN SOD (PORK) LOCK FLUSH 100 UNIT/ML IV SOLN
500.0000 [IU] | Freq: Once | INTRAVENOUS | Status: AC
  Administered 2018-05-03: 500 [IU] via INTRAVENOUS
  Filled 2018-05-03: qty 5

## 2018-05-04 ENCOUNTER — Telehealth: Payer: Self-pay | Admitting: Hematology

## 2018-05-04 NOTE — Telephone Encounter (Signed)
No los 10/7 °

## 2018-05-11 ENCOUNTER — Inpatient Hospital Stay: Payer: BLUE CROSS/BLUE SHIELD

## 2018-05-11 ENCOUNTER — Encounter: Payer: Self-pay | Admitting: Nurse Practitioner

## 2018-05-11 ENCOUNTER — Inpatient Hospital Stay (HOSPITAL_BASED_OUTPATIENT_CLINIC_OR_DEPARTMENT_OTHER): Payer: BLUE CROSS/BLUE SHIELD | Admitting: Nurse Practitioner

## 2018-05-11 VITALS — BP 120/64 | HR 81 | Temp 98.3°F | Resp 18 | Ht 64.0 in | Wt 164.8 lb

## 2018-05-11 DIAGNOSIS — Z23 Encounter for immunization: Secondary | ICD-10-CM | POA: Diagnosis not present

## 2018-05-11 DIAGNOSIS — R42 Dizziness and giddiness: Secondary | ICD-10-CM | POA: Diagnosis not present

## 2018-05-11 DIAGNOSIS — Z170 Estrogen receptor positive status [ER+]: Secondary | ICD-10-CM

## 2018-05-11 DIAGNOSIS — F419 Anxiety disorder, unspecified: Secondary | ICD-10-CM | POA: Diagnosis not present

## 2018-05-11 DIAGNOSIS — C50211 Malignant neoplasm of upper-inner quadrant of right female breast: Secondary | ICD-10-CM

## 2018-05-11 DIAGNOSIS — C773 Secondary and unspecified malignant neoplasm of axilla and upper limb lymph nodes: Secondary | ICD-10-CM | POA: Diagnosis not present

## 2018-05-11 DIAGNOSIS — Z95828 Presence of other vascular implants and grafts: Secondary | ICD-10-CM

## 2018-05-11 DIAGNOSIS — R21 Rash and other nonspecific skin eruption: Secondary | ICD-10-CM | POA: Diagnosis not present

## 2018-05-11 DIAGNOSIS — R52 Pain, unspecified: Secondary | ICD-10-CM

## 2018-05-11 DIAGNOSIS — Z5189 Encounter for other specified aftercare: Secondary | ICD-10-CM | POA: Diagnosis not present

## 2018-05-11 DIAGNOSIS — Z5111 Encounter for antineoplastic chemotherapy: Secondary | ICD-10-CM | POA: Diagnosis not present

## 2018-05-11 DIAGNOSIS — I10 Essential (primary) hypertension: Secondary | ICD-10-CM

## 2018-05-11 DIAGNOSIS — R3915 Urgency of urination: Secondary | ICD-10-CM | POA: Diagnosis not present

## 2018-05-11 DIAGNOSIS — Z5112 Encounter for antineoplastic immunotherapy: Secondary | ICD-10-CM | POA: Diagnosis not present

## 2018-05-11 DIAGNOSIS — R918 Other nonspecific abnormal finding of lung field: Secondary | ICD-10-CM | POA: Diagnosis not present

## 2018-05-11 LAB — CMP (CANCER CENTER ONLY)
ALT: 15 U/L (ref 0–44)
AST: 21 U/L (ref 15–41)
Albumin: 3.4 g/dL — ABNORMAL LOW (ref 3.5–5.0)
Alkaline Phosphatase: 111 U/L (ref 38–126)
Anion gap: 8 (ref 5–15)
BUN: 9 mg/dL (ref 6–20)
CHLORIDE: 106 mmol/L (ref 98–111)
CO2: 28 mmol/L (ref 22–32)
CREATININE: 0.62 mg/dL (ref 0.44–1.00)
Calcium: 9.2 mg/dL (ref 8.9–10.3)
GFR, Est AFR Am: >60 mL/min (ref >60)
GFR, Estimated: >60 mL/min (ref >60)
GLUCOSE: 115 mg/dL — AB (ref 70–99)
POTASSIUM: 3.5 mmol/L (ref 3.5–5.1)
SODIUM: 142 mmol/L (ref 135–145)
Total Bilirubin: 0.4 mg/dL (ref 0.3–1.2)
Total Protein: 6.4 g/dL — ABNORMAL LOW (ref 6.5–8.1)

## 2018-05-11 LAB — CBC WITH DIFFERENTIAL (CANCER CENTER ONLY)
ABS IMMATURE GRANULOCYTES: 0.03 10*3/uL (ref 0.00–0.07)
BASOS ABS: 0 10*3/uL (ref 0.0–0.1)
Basophils Relative: 0 %
Eosinophils Absolute: 0 10*3/uL (ref 0.0–0.5)
Eosinophils Relative: 0 %
HCT: 31.1 % — ABNORMAL LOW (ref 36.0–46.0)
Hemoglobin: 10.9 g/dL — ABNORMAL LOW (ref 12.0–15.0)
IMMATURE GRANULOCYTES: 1 %
Lymphocytes Relative: 19 %
Lymphs Abs: 1.2 10*3/uL (ref 0.7–4.0)
MCH: 32.7 pg (ref 26.0–34.0)
MCHC: 35 g/dL (ref 30.0–36.0)
MCV: 93.4 fL (ref 80.0–100.0)
MONOS PCT: 11 %
Monocytes Absolute: 0.7 10*3/uL (ref 0.1–1.0)
NEUTROS ABS: 4.4 10*3/uL (ref 1.7–7.7)
NEUTROS PCT: 69 %
NRBC: 0 % (ref 0.0–0.2)
PLATELETS: 215 10*3/uL (ref 150–400)
RBC: 3.33 MIL/uL — AB (ref 3.87–5.11)
RDW: 15.3 % (ref 11.5–15.5)
WBC Count: 6.3 10*3/uL (ref 4.0–10.5)

## 2018-05-11 LAB — URINALYSIS, COMPLETE (UACMP) WITH MICROSCOPIC
BACTERIA UA: NONE SEEN
Bilirubin Urine: NEGATIVE
Glucose, UA: NEGATIVE mg/dL
Hgb urine dipstick: NEGATIVE
KETONES UR: NEGATIVE mg/dL
LEUKOCYTES UA: NEGATIVE
Nitrite: NEGATIVE
PROTEIN: NEGATIVE mg/dL
Specific Gravity, Urine: 1.006 (ref 1.005–1.030)
pH: 8 (ref 5.0–8.0)

## 2018-05-11 MED ORDER — DIPHENHYDRAMINE HCL 25 MG PO CAPS
ORAL_CAPSULE | ORAL | Status: AC
  Filled 2018-05-11: qty 2

## 2018-05-11 MED ORDER — TRASTUZUMAB CHEMO 150 MG IV SOLR
450.0000 mg | Freq: Once | INTRAVENOUS | Status: AC
  Administered 2018-05-11: 450 mg via INTRAVENOUS
  Filled 2018-05-11: qty 21.4

## 2018-05-11 MED ORDER — SODIUM CHLORIDE 0.9 % IV SOLN
Freq: Once | INTRAVENOUS | Status: AC
  Administered 2018-05-11: 12:00:00 via INTRAVENOUS
  Filled 2018-05-11: qty 250

## 2018-05-11 MED ORDER — SODIUM CHLORIDE 0.9% FLUSH
10.0000 mL | INTRAVENOUS | Status: DC | PRN
  Administered 2018-05-11: 10 mL
  Filled 2018-05-11: qty 10

## 2018-05-11 MED ORDER — INFLUENZA VAC SPLIT QUAD 0.5 ML IM SUSY
PREFILLED_SYRINGE | INTRAMUSCULAR | Status: AC
  Filled 2018-05-11: qty 0.5

## 2018-05-11 MED ORDER — SODIUM CHLORIDE 0.9 % IV SOLN
420.0000 mg | Freq: Once | INTRAVENOUS | Status: AC
  Administered 2018-05-11: 420 mg via INTRAVENOUS
  Filled 2018-05-11: qty 14

## 2018-05-11 MED ORDER — HEPARIN SOD (PORK) LOCK FLUSH 100 UNIT/ML IV SOLN
500.0000 [IU] | Freq: Once | INTRAVENOUS | Status: AC | PRN
  Administered 2018-05-11: 500 [IU]
  Filled 2018-05-11: qty 5

## 2018-05-11 MED ORDER — ACETAMINOPHEN 325 MG PO TABS
650.0000 mg | ORAL_TABLET | Freq: Once | ORAL | Status: AC
  Administered 2018-05-11: 650 mg via ORAL

## 2018-05-11 MED ORDER — SODIUM CHLORIDE 0.9 % IV SOLN
75.0000 mg/m2 | Freq: Once | INTRAVENOUS | Status: AC
  Administered 2018-05-11: 140 mg via INTRAVENOUS
  Filled 2018-05-11: qty 14

## 2018-05-11 MED ORDER — PALONOSETRON HCL INJECTION 0.25 MG/5ML
INTRAVENOUS | Status: AC
  Filled 2018-05-11: qty 5

## 2018-05-11 MED ORDER — PALONOSETRON HCL INJECTION 0.25 MG/5ML
0.2500 mg | Freq: Once | INTRAVENOUS | Status: AC
  Administered 2018-05-11: 0.25 mg via INTRAVENOUS

## 2018-05-11 MED ORDER — INFLUENZA VAC SPLIT QUAD 0.5 ML IM SUSY
0.5000 mL | PREFILLED_SYRINGE | Freq: Once | INTRAMUSCULAR | Status: AC
  Administered 2018-05-11: 0.5 mL via INTRAMUSCULAR

## 2018-05-11 MED ORDER — SODIUM CHLORIDE 0.9 % IV SOLN
611.5000 mg | Freq: Once | INTRAVENOUS | Status: AC
  Administered 2018-05-11: 610 mg via INTRAVENOUS
  Filled 2018-05-11: qty 61

## 2018-05-11 MED ORDER — DIPHENHYDRAMINE HCL 25 MG PO CAPS
50.0000 mg | ORAL_CAPSULE | Freq: Once | ORAL | Status: AC
  Administered 2018-05-11: 50 mg via ORAL

## 2018-05-11 MED ORDER — SODIUM CHLORIDE 0.9 % IV SOLN
Freq: Once | INTRAVENOUS | Status: AC
  Administered 2018-05-11: 12:00:00 via INTRAVENOUS
  Filled 2018-05-11: qty 5

## 2018-05-11 MED ORDER — ACETAMINOPHEN 325 MG PO TABS
ORAL_TABLET | ORAL | Status: AC
  Filled 2018-05-11: qty 2

## 2018-05-11 NOTE — Progress Notes (Addendum)
Claire Guzman  [phone] [phone]  Clinic Follow up Note   Patient Care Team: Lujean Amel, MD as PCP - General (Family Medicine) Fanny Skates, MD as Consulting Physician (General Surgery) Truitt Merle, MD as Consulting Physician (Hematology) Eppie Gibson, MD as Attending Physician (Radiation Oncology) 05/11/2018  SUMMARY OF ONCOLOGIC HISTORY: Oncology History   Cancer Staging Malignant neoplasm of upper-inner quadrant of right breast in female, estrogen receptor positive ([place]) Staging form: Breast, AJCC 8th Edition - Clinical stage from 03/08/2018: Stage IB (cT2, cN1, cM0, G3, ER+, PR+, HER2+) - Signed by Truitt Merle, MD on 03/16/2018       Malignant neoplasm of upper-inner quadrant of right breast in female, estrogen receptor positive ([place])   02/24/2018 Mammogram    screening mammogram on 02/24/2018 that was benign    03/04/2018 Mammogram    diagnostic mammogram on 03/04/2018 due to a palpable mass on her right breast. Diagnostic mammogram, breast US revealed and biopsy revealed suspicious mass in the right breast at 2:30 at the palpable site of concern and one suspicious right axillary LN.    03/08/2018 Cancer Staging    Staging form: Breast, AJCC 8th Edition - Clinical stage from 03/08/2018: Stage IB (cT2, cN1, cM0, G3, ER+, PR+, HER2+) - Signed by Truitt Merle, MD on 03/16/2018    03/08/2018 Receptors her2    Her2 positive, ER 90% PR 5% and Ki67 60%    03/08/2018 Pathology Results    Diagnosis 1. Breast, right, needle core biopsy, 2:30 - INVASIVE DUCTAL CARCINOMA, GRADE 3. - LYMPHOVASCULAR SPACE INVOLVEMENT BY TUMOR. 2. Lymph node, needle/core biopsy, right axilla - METASTATIC CARCINOMA INVOLVING SCANT LYMPHOID TISSUE.    03/10/2018 Initial Diagnosis    Malignant neoplasm of upper-inner quadrant of right breast in female, estrogen receptor positive ([place])    03/20/2018 Imaging    MRI brain 03/20/18 IMPRESSION: No evidence of metastatic  disease.  Normal appearance of brain. Slightly heterogeneous marrow pattern of the upper cervical spine, nonspecific but likely benign, especially in the setting of early stage disease.     03/24/2018 Echocardiogram    Baseline ECHO 03/24/18 Study Conclusions - Left ventricle: The cavity size was normal. Wall thickness was   increased in a pattern of mild LVH. Systolic function was normal.   The estimated ejection fraction was in the range of 60% to 65%.   Wall motion was normal; there were no regional wall motion   abnormalities. Doppler parameters are consistent with abnormal   left ventricular relaxation (grade 1 diastolic dysfunction). - Right ventricle: The cavity size was mildly dilated. Wall   thickness was normal.    03/25/2018 Imaging    MRI Breast B/l 03/25/18 IMPRESSION: 1. The patient's primary malignancy in the posterior right breast measures 3.2 x 2 x 2.6 cm with apparent invasion of the underlying pectoralis muscle. Numerous surrounding abnormal enhancing masses, consistent with satellite lesions, extend from 11 o'clock in the right breast into the inferolateral right breast with a total span of suspected disease measuring 4.5 x 4.6 x 6.4 cm in AP, transverse, and craniocaudal dimension. The suspected extent of disease involves the superior and inferolateral quadrants. The primary malignancy also extends just across midline into the superior medial quadrant. 2. No MRI evidence of malignancy in the left breast. 3. Known metastatic noted in the right axilla.    03/25/2018 Imaging    Whole body bone scan 03/25/18 IMPRESSION: No scintigraphic evidence skeletal metastasis.    03/25/2018 Imaging    CT  CAP W Contrast 03/25/18 IMPRESSION: 1. Mass within the deep aspect of the right breast along the pectoralis muscle compatible with primary breast malignancy. 2. There is suggestion of two additional possible masses within the lateral aspect of the right breast versus nodular  appearing breast tissue. Recommend dedicated evaluation with bilateral breast MRI. 3. Mildly thickened right axillary lymph node compatible with metastatic adenopathy, recently biopsied. 4. Multiple bilateral pulmonary nodules are indeterminate, potentially sequelae of prior infectious/inflammatory process. Recommend attention on follow-up. Consider follow-up CT in 6 months. 5. Portal venous gas is demonstrated within the left hepatic lobe. Additionally, there is wall thickening of the cecum and ascending colon with small amount of gas within the pericolonic vasculature. Constellation of findings is indeterminate in etiology however may be secondary to colitis at this location with considerations including infectious, inflammatory or ischemic etiologies. Correlate for recent procedure. If patient is not up-to-date for colonic screening, recommend further evaluation with colonoscopy to exclude the possibility of colonic mass within the cecum/ascending colon. 6. These results were called by telephone at the time of interpretation on 03/25/2018 at 9:31 am to Dr. Truitt Merle , who verbally acknowledged these results.    03/30/2018 -  Chemotherapy    Neoadjuvant TCHP every 3 weeks starting 03/30/18     Genetic Testing    Negative genetic testing on the [facility] Common Hereditary Cancers Panel. The Common Hereditary Cancers Panel offered by [facility] includes sequencing and/or deletion duplication testing of the following 47 genes: APC, ATM, AXIN2, BARD1, BMPR1A, BRCA1, BRCA2, BRIP1, CDH1, CDKN2A (p14ARF), CDKN2A (p16INK4a), CKD4, CHEK2, CTNNA1, DICER1, EPCAM (Deletion/duplication testing only), GREM1 (promoter region deletion/duplication testing only), KIT, MEN1, MLH1, MSH2, MSH3, MSH6, MUTYH, NBN, NF1, NHTL1, PALB2, PDGFRA, PMS2, POLD1, POLE, PTEN, RAD50, RAD51C, RAD51D, SDHB, SDHC, SDHD, SMAD4, SMARCA4. STK11, TP53, TSC1, TSC2, and VHL.  The following genes were evaluated for sequence changes only: SDHA  and HOXB13 c.251G>A variant only.   Genetic testing did detect a Variant of Unknown Significance (VUS) in the POLD1 gene called c.985C>T (p.Pro329Ser). At this time, it is unknown if this variant is associated with increased cancer risk or if this is a normal finding, but most variants such as this get reclassified to being inconsequential. It should not be used to make medical management decisions.   The report date is 04/09/2018.   CURRENT THERAPY: Neoadjuvant TCHP every 3 weeks starting 03/30/18, plan for 6 cycles, carbo dose reduction from cycle 2 due to poor tolerance   INTERVAL HISTORY: Claire Guzman returns for follow up and cycle 3 TCHP as scheduled. She completed cycle 2 on 9/25, with udenyca on 9/27. She feels cycle 2 went much better than cycle 1. She has moderate fatigue and bone pain day after injection for 1.5 days then recovers. She takes Claritin. She remains active, works, and cleans her home. She had less oral and hand/foot irritation after cycle 2. Her hands are still pink with some peeling and slightly swollen. Aloe more effective than anything else. She has occasional post prandial nausea, tries to avoid medication because it makes her drowsy. No vomiting. She fluctuates between constipation after treatment then diarrhea during 2nd week, resolves with imodium x1. She has urinary urgency with occasional incontinence. Denies frequency or dysuria. No clear hematuria, but urine is "peach" color. Denies foul odor.    MEDICAL HISTORY:  Past Medical History:  Diagnosis Date  . Abdominal pain, unspecified site   . Anxiety state, unspecified   . Cancer [facility])    R breast  cancer  . Contact dermatitis and other eczema, due to unspecified cause   . Essential hypertension, benign   . Family history of colon cancer   . Family history of melanoma   . Family history of prostate cancer   . GERD (gastroesophageal reflux disease)   . Hematuria, unspecified   . Hypertension   . Lower back pain    . Mixed hyperlipidemia   . Rosacea   . Unspecified symptom associated with female genital organs     SURGICAL HISTORY: Past Surgical History:  Procedure Laterality Date  . CESAREAN SECTION    . DIAGNOSTIC LAPAROSCOPY    . LAPAROSCOPIC APPENDECTOMY    . PORTACATH PLACEMENT Left 04/13/2018   Procedure: INSERTION PORT-A-CATH WITH [place];  Surgeon: Fanny Skates, MD;  Location: [facility];  Service: General;  Laterality: Left;  . UTERINE FIBROID SURGERY      I have reviewed the social history and family history with the patient and they are unchanged from previous note.  ALLERGIES:  is allergic to metrogel [metronidazole]; other; paxil [paroxetine hcl]; prednisone; tramadol; and zomig [zolmitriptan].  MEDICATIONS:  Current Outpatient Medications  Medication Sig Dispense Refill  . ALPRAZolam (XANAX) 0.5 MG tablet Take 1 tablet (0.5 mg total) by mouth at bedtime as needed for anxiety. 30 tablet 0  . atenolol (TENORMIN) 50 MG tablet Take 50 mg by mouth at bedtime.    Marland Kitchen HYDROcodone-acetaminophen (NORCO) 5-325 MG tablet Take 1-2 tablets by mouth every 6 (six) hours as needed for moderate pain or severe pain. 20 tablet 0  . omeprazole (PRILOSEC) 20 MG capsule Take 1 capsule (20 mg total) by mouth 2 (two) times daily before a meal. 60 capsule 2  . ondansetron (ZOFRAN) 8 MG tablet Take 1 tablet (8 mg total) by mouth 2 (two) times daily as needed (Nausea or vomiting). Begin 4 days after chemotherapy. 30 tablet 1  . potassium chloride SA (K-DUR,KLOR-CON) 20 MEQ tablet TAKE 1 TABLET(20 MEQ) BY MOUTH TWICE DAILY 30 tablet 0  . prochlorperazine (COMPAZINE) 10 MG tablet TAKE 1 TABLET BY MOUTH EVERY 6 HOURS AS NEEDED FOR NAUSEA OR VOMITING 30 tablet 0  . traMADol (ULTRAM) 50 MG tablet Take 1 tablet (50 mg total) by mouth every 12 (twelve) hours as needed. 30 tablet 0  . venlafaxine (EFFEXOR) 37.5 MG tablet Take 37.5 mg by mouth 2 (two) times daily.    Marland Kitchen dexamethasone (DECADRON) 4 MG tablet  Take 1 tablet (4 mg total) by mouth 2 (two) times daily. Start the day before Taxotere. Take once the day after, then 2 times a day x 2d. 30 tablet 1  . doxycycline (VIBRA-TABS) 100 MG tablet Take 1 tablet (100 mg total) by mouth 2 (two) times daily. 60 tablet 0  . HYDROcodone-acetaminophen (NORCO/VICODIN) 5-325 MG tablet Take 1 tablet by mouth every 6 (six) hours as needed for severe pain. 20 tablet 0  . lidocaine-prilocaine (EMLA) cream Apply to affected area once 30 g 3  . triamcinolone ointment (KENALOG) 0.5 % Apply 1 application topically 2 (two) times daily. 30 g 1   No current facility-administered medications for this visit.    Facility-Administered Medications Ordered in Other Visits  Medication Dose Route Frequency Provider Last Rate Last Dose  . CARBOplatin (PARAPLATIN) 610 mg in sodium chloride 0.9 % 250 mL chemo infusion  610 mg Intravenous Once Truitt Merle, MD      . DOCEtaxel (TAXOTERE) 140 mg in sodium chloride 0.9 % 250 mL chemo infusion  75  mg/m2 (Treatment Plan Recorded) Intravenous Once Truitt Merle, MD      . fosaprepitant (EMEND) 150 mg, dexamethasone (DECADRON) 12 mg in sodium chloride 0.9 % 145 mL IVPB   Intravenous Once Truitt Merle, MD      . heparin lock flush 100 unit/mL  500 Units Intracatheter Once PRN Truitt Merle, MD      . pertuzumab (PERJETA) 420 mg in sodium chloride 0.9 % 250 mL chemo infusion  420 mg Intravenous Once Truitt Merle, MD      . sodium chloride flush (NS) 0.9 % injection 10 mL  10 mL Intracatheter PRN Truitt Merle, MD      . trastuzumab (HERCEPTIN) 450 mg in sodium chloride 0.9 % 250 mL chemo infusion  450 mg Intravenous Once Truitt Merle, MD        PHYSICAL EXAMINATION: ECOG PERFORMANCE STATUS: 1 - Symptomatic but completely ambulatory  Vitals:   05/11/18 1031  BP: 120/64  Pulse: 81  Resp: 18  Temp: 98.3 F (36.8 C)  SpO2: 99%   Filed Weights   05/11/18 1031  Weight: 164 lb 12.8 oz (74.8 kg)    GENERAL:alert, no distress and comfortable SKIN: no  rashes or significant lesions. Mild erythema and peeling to palms EYES: sclera clear OROPHARYNX:no thrush or ulcers LYMPH:  no palpable cervical, supraclavicular, or axillary lymphadenopathy LUNGS: clear to auscultation with normal breathing effort HEART: regular rate & rhythm, no lower extremity edema ABDOMEN:abdomen soft, non-tender and normal bowel sounds Musculoskeletal:no cyanosis of digits and no clubbing  NEURO: alert & oriented x 3 with fluent speech, no focal motor/sensory deficits PAC without erythema Breast exam: inspection reveals symmetrical breasts without nipple discharge or inversion. No palpable mass in either breast or axilla  LABORATORY DATA:  I have reviewed the data as listed CBC Latest Ref Rng & Units 05/11/2018 05/03/2018 04/21/2018  WBC 4.0 - 10.5 K/uL 6.3 13.3(H) 6.9  Hemoglobin 12.0 - 15.0 g/dL 10.9(L) 11.2(L) 12.8  Hematocrit 36.0 - 46.0 % 31.1(L) 31.9(L) 36.8  Platelets 150 - 400 K/uL 215 172 225     CMP Latest Ref Rng & Units 05/11/2018 05/03/2018 04/21/2018  Glucose 70 - 99 mg/dL 115(H) 90 124(H)  BUN 6 - 20 mg/dL _0 Creatinine 0.44 - 1.00 mg/dL 0.62 0.64 0.70  Sodium 135 - 145 mmol/L 142 140 143  Potassium 3.5 - 5.1 mmol/L 3.5 3.7 3.2(L)  Chloride 98 - 111 mmol/L 106 105 109  CO2 22 - 32 mmol/L _1 Calcium 8.9 - 10.3 mg/dL 9.2 9.1 9.2  Total Protein 6.5 - 8.1 g/dL 6.4(L) 6.8 7.0  Total Bilirubin 0.3 - 1.2 mg/dL 0.4 0.4 0.5  Alkaline Phos 38 - 126 U/L 111 138(H) 109  AST 15 - 41 U/L _2 ALT 0 - 44 U/L _3 PATHOLOGY:   03/17/2018 Molecular Pathology   03/08/2018 Surgical Pathology ADDITIONAL INFORMATION: 1. PROGNOSTIC INDICATORS Results: IMMUNOHISTOCHEMICAL AND MORPHOMETRIC ANALYSIS PERFORMED MANUALLY The tumor cells are positive for Her2 (3+). Estrogen Receptor: 90%, POSITIVE, STRONG STAINING INTENSITY Progesterone Receptor: 5%, POSITIVE, STRONG STAINING INTENSITY Proliferation Marker Ki67: 60% REFERENCE RANGE  ESTROGEN RECEPTOR NEGATIVE 0% POSITIVE =>1% REFERENCE RANGE PROGESTERONE RECEPTOR NEGATIVE 0% POSITIVE =>1% All controls stained appropriately Diagnosis 1. Breast, right, needle core biopsy, 2:30 - INVASIVE DUCTAL CARCINOMA, GRADE 3. - LYMPHOVASCULAR SPACE INVOLVEMENT BY TUMOR. 2. Lymph node, needle/core biopsy, right axilla - METASTATIC CARCINOMA INVOLVING SCANT LYMPHOID TISSUE. Synoptic Report 1. Breast prognostic profile will be  performed. Dr. Vic Ripper agrees. Called to [facility] on 03/09/18. (JDP:gt, 03/09/18) Specimen Comment 1. Formalin 1:30 PM; right breast mass 2. Axillary adenopathy Specimen(s) Obtained: 1. Breast, right, needle core biopsy, 2:30 2. Lymph node, needle/core biopsy, right axilla Specimen Clinical Information 1. R/O IMC 2. R/O axillary metastasis Gross 1. Received in formalin (time in formalin 1:30 p.m., cold ischemic time unknown) are two cores of soft tan yellow tissue measuring 1.3 and 1.8 cm in length and each 0.2 cm in diameter. The specimen is submitted in toto. 2. Received in formalin (time in formalin 1:30 p.m., cold ischemic time unknown) is a 1.3 x 0.2 cm core of soft hemorrhagic tissue and a separate 0.2 cm soft tan yellow tissue fragment. The specimen is submitted in toto. (GP:gt, 03/09/18) Stain(s) used in Diagnosis: The following stain(s) were used in diagnosing the case: ER-ACIS, PR-ACIS, Her2 by IHC, KI-67-ACIS. The control(s) stained appropriately.    RADIOGRAPHIC STUDIES: I have personally reviewed the radiological images as listed and agreed with the findings in the report. No results found.   ASSESSMENT & PLAN: Claire Guzman is a 52 y.o. lovely female with a history of   1.Malignant neoplasm of upper-inner quadrant of right breast in female, invasive ductal carcinoma, cT2N1M0 stage IB, ER+/PR+/HER2+, G3  -Ms. Terwilliger appears stable. She completed cycle 2 neoadjuvant TCHP, she had better tolerance to  cycle 2 with reduced dose carboplatin to AUC 5. She is doing well today. Clinically, the right breast mass and axillary LN are not palpable on my exam, indicating good clinical response to treatment. Will continue to monitor.  -CBC and CMP reviewed, adequate to proceed with cycle 3 TCHP today at current dose.  -She will return for Udenyca on day 3, and in 3 weeks for cycle 4.  -she will receive flu vaccine today  2.Genetics  -She has no family history of breast cancer, due to her relatively young age of breast cancer, she is a candidate for genetic testing. She expressed interest -Genetic testing performed through[facility]'s Common Hereditary Cancers Panelreported out on 9/16/2019showed no pathogenic mutations.The Common Hereditary Cancers Panel offered by [facility] includes sequencing and/or deletion duplication testing of the following 47 genes: APC, ATM, AXIN2, BARD1, BMPR1A, BRCA1, BRCA2, BRIP1, CDH1, CDKN2A (p14ARF), CDKN2A (p16INK4a), CKD4, CHEK2, CTNNA1, DICER1, EPCAM (Deletion/duplication testing only), GREM1 (promoter region deletion/duplication testing only), KIT, MEN1, MLH1, MSH2, MSH3, MSH6, MUTYH, NBN, NF1, NHTL1, PALB2, PDGFRA, PMS2, POLD1, POLE, PTEN, RAD50, RAD51C, RAD51D, SDHB, SDHC, SDHD, SMAD4, SMARCA4. STK11, TP53, TSC1, TSC2, and VHL. The following genes were evaluated for sequence changes only: SDHA and HOXB13 c.251G>A variant only. A variant of uncertain significance (VUS) in a gene calledPOLD1was also noted.   3. Anxiety  -She has history of anxiety, currently on Xanax as needed  4. HTN -She will continue atenolol. We we will monitor her blood pressure closely during the chemotherapy, and adjust her medication as needed. -BP normal lately   5. Skin rash -Red rash on her back, upper chest, face and scalp, likely related to Herceptin and pejeta - she completed doxycycline, which cleared the rash. 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 Body pain -Secondary to chemotherapy and Neulasta -She will use tramadol and norco  as needed for severe pain -Improved after chemo dose reduction from cycle 2  7.  Bilateral small lung nodules -Her restaging CT scan showed bilateral multiple small lung nodules, 3 to 4 mm, indeterminate, likely benign.  She is a never smoker -Plan to repeat CT chest without contrast in 6 months  8. Urinary urgency  -she developed urinary urgency and intermittent incontinence, no frequency, dysuria, or frank hematuria -[place] today negative for UTI, I recommend Kegel exercises to strengthen pelvic floor  Plan: -Labs reviewed, counts adequate for treatment - proceed with Cycle 3 neoadjuvant TCHP at current doses -UA today negative for UTI, I recommend kegel exercises  -wear gloves to protect hands when appropriate at work -Flu vaccine today Claire Guzman on day 3 -F/u in 3 weeks with cycle 4   Orders Placed This Encounter  Procedures  . Urinalysis, Complete w Microscopic    Standing Status:   Future    Standing Expiration Date:   05/12/2019   All questions were answered. The patient knows to call the clinic with any problems, questions or concerns. No barriers to learning was detected.    Alla Feeling, NP 05/11/18

## 2018-05-11 NOTE — Patient Instructions (Signed)
[facility] Discharge Instructions for Patients Receiving Chemotherapy  Today you received the following chemotherapy agents Taxol, Carbo, Herceptin, Perjeta  To help prevent nausea and vomiting after your treatment, we encourage you to take your nausea medication as prescribed.   If you develop nausea and vomiting that is not controlled by your nausea medication, call the clinic.   BELOW ARE SYMPTOMS THAT SHOULD BE REPORTED IMMEDIATELY:  *FEVER GREATER THAN 100.5 F  *CHILLS WITH OR WITHOUT FEVER  NAUSEA AND VOMITING THAT IS NOT CONTROLLED WITH YOUR NAUSEA MEDICATION  *UNUSUAL SHORTNESS OF BREATH  *UNUSUAL BRUISING OR BLEEDING  TENDERNESS IN MOUTH AND THROAT WITH OR WITHOUT PRESENCE OF ULCERS  *URINARY PROBLEMS  *BOWEL PROBLEMS  UNUSUAL RASH Items with * indicate a potential emergency and should be followed up as soon as possible.  Feel free to call the clinic should you have any questions or concerns. The clinic phone number is [phone].  Please show the CHEMO ALERT CARD at check-in to the Emergency Department and triage nurse.   

## 2018-05-12 ENCOUNTER — Telehealth: Payer: Self-pay | Admitting: Hematology

## 2018-05-12 NOTE — Telephone Encounter (Signed)
Appt scheduled patient notified per 10/15 los

## 2018-05-13 ENCOUNTER — Inpatient Hospital Stay: Payer: BLUE CROSS/BLUE SHIELD

## 2018-05-13 DIAGNOSIS — Z170 Estrogen receptor positive status [ER+]: Secondary | ICD-10-CM | POA: Diagnosis not present

## 2018-05-13 DIAGNOSIS — F419 Anxiety disorder, unspecified: Secondary | ICD-10-CM | POA: Diagnosis not present

## 2018-05-13 DIAGNOSIS — C50211 Malignant neoplasm of upper-inner quadrant of right female breast: Secondary | ICD-10-CM

## 2018-05-13 DIAGNOSIS — R42 Dizziness and giddiness: Secondary | ICD-10-CM | POA: Diagnosis not present

## 2018-05-13 DIAGNOSIS — Z5112 Encounter for antineoplastic immunotherapy: Secondary | ICD-10-CM | POA: Diagnosis not present

## 2018-05-13 DIAGNOSIS — I10 Essential (primary) hypertension: Secondary | ICD-10-CM | POA: Diagnosis not present

## 2018-05-13 DIAGNOSIS — R918 Other nonspecific abnormal finding of lung field: Secondary | ICD-10-CM | POA: Diagnosis not present

## 2018-05-13 DIAGNOSIS — Z23 Encounter for immunization: Secondary | ICD-10-CM | POA: Diagnosis not present

## 2018-05-13 DIAGNOSIS — Z5111 Encounter for antineoplastic chemotherapy: Secondary | ICD-10-CM | POA: Diagnosis not present

## 2018-05-13 DIAGNOSIS — R21 Rash and other nonspecific skin eruption: Secondary | ICD-10-CM | POA: Diagnosis not present

## 2018-05-13 DIAGNOSIS — R52 Pain, unspecified: Secondary | ICD-10-CM | POA: Diagnosis not present

## 2018-05-13 DIAGNOSIS — Z5189 Encounter for other specified aftercare: Secondary | ICD-10-CM | POA: Diagnosis not present

## 2018-05-13 DIAGNOSIS — C773 Secondary and unspecified malignant neoplasm of axilla and upper limb lymph nodes: Secondary | ICD-10-CM | POA: Diagnosis not present

## 2018-05-13 MED ORDER — PEGFILGRASTIM-CBQV 6 MG/0.6ML ~~LOC~~ SOSY
6.0000 mg | PREFILLED_SYRINGE | Freq: Once | SUBCUTANEOUS | Status: AC
  Administered 2018-05-13: 6 mg via SUBCUTANEOUS

## 2018-05-13 MED ORDER — PEGFILGRASTIM-CBQV 6 MG/0.6ML ~~LOC~~ SOSY
PREFILLED_SYRINGE | SUBCUTANEOUS | Status: AC
  Filled 2018-05-13: qty 0.6

## 2018-05-13 NOTE — Patient Instructions (Signed)
Pegfilgrastim injection What is this medicine? PEGFILGRASTIM (PEG fil gra stim) is a long-acting granulocyte colony-stimulating factor that stimulates the growth of neutrophils, a type of white blood cell important in the body's fight against infection. It is used to reduce the incidence of fever and infection in patients with certain types of cancer who are receiving chemotherapy that affects the bone marrow, and to increase survival after being exposed to high doses of radiation. This medicine may be used for other purposes; ask your health care provider or pharmacist if you have questions. COMMON BRAND NAME(S): Neulasta What should I tell my health care provider before I take this medicine? They need to know if you have any of these conditions: -kidney disease -latex allergy -ongoing radiation therapy -sickle cell disease -skin reactions to acrylic adhesives (On-Body Injector only) -an unusual or allergic reaction to pegfilgrastim, filgrastim, other medicines, foods, dyes, or preservatives -pregnant or trying to get pregnant -breast-feeding How should I use this medicine? This medicine is for injection under the skin. If you get this medicine at home, you will be taught how to prepare and give the pre-filled syringe or how to use the On-body Injector. Refer to the patient Instructions for Use for detailed instructions. Use exactly as directed. Tell your healthcare provider immediately if you suspect that the On-body Injector may not have performed as intended or if you suspect the use of the On-body Injector resulted in a missed or partial dose. It is important that you put your used needles and syringes in a special sharps container. Do not put them in a trash can. If you do not have a sharps container, call your pharmacist or healthcare provider to get one. Talk to your pediatrician regarding the use of this medicine in children. While this drug may be prescribed for selected conditions,  precautions do apply. Overdosage: If you think you have taken too much of this medicine contact a poison control center or emergency room at once. NOTE: This medicine is only for you. Do not share this medicine with others. What if I miss a dose? It is important not to miss your dose. Call your doctor or health care professional if you miss your dose. If you miss a dose due to an On-body Injector failure or leakage, a new dose should be administered as soon as possible using a single prefilled syringe for manual use. What may interact with this medicine? Interactions have not been studied. Give your health care provider a list of all the medicines, herbs, non-prescription drugs, or dietary supplements you use. Also tell them if you smoke, drink alcohol, or use illegal drugs. Some items may interact with your medicine. This list may not describe all possible interactions. Give your health care provider a list of all the medicines, herbs, non-prescription drugs, or dietary supplements you use. Also tell them if you smoke, drink alcohol, or use illegal drugs. Some items may interact with your medicine. What should I watch for while using this medicine? You may need blood work done while you are taking this medicine. If you are going to need a MRI, CT scan, or other procedure, tell your doctor that you are using this medicine (On-Body Injector only). What side effects may I notice from receiving this medicine? Side effects that you should report to your doctor or health care professional as soon as possible: -allergic reactions like skin rash, itching or hives, swelling of the face, lips, or tongue -dizziness -fever -pain, redness, or irritation at site   where injected -pinpoint red spots on the skin -red or dark-brown urine -shortness of breath or breathing problems -stomach or side pain, or pain at the shoulder -swelling -tiredness -trouble passing urine or change in the amount of urine Side  effects that usually do not require medical attention (report to your doctor or health care professional if they continue or are bothersome): -bone pain -muscle pain This list may not describe all possible side effects. Call your doctor for medical advice about side effects. You may report side effects to FDA at 1-800-FDA-1088. Where should I keep my medicine? Keep out of the reach of children. Store pre-filled syringes in a refrigerator between 2 and 8 degrees C (36 and 46 degrees F). Do not freeze. Keep in carton to protect from light. Throw away this medicine if it is left out of the refrigerator for more than 48 hours. Throw away any unused medicine after the expiration date. NOTE: This sheet is a summary. It may not cover all possible information. If you have questions about this medicine, talk to your doctor, pharmacist, or health care provider.  2018 [employer] (2016-07-10 12:58:03)  

## 2018-05-17 ENCOUNTER — Other Ambulatory Visit: Payer: BLUE CROSS/BLUE SHIELD

## 2018-05-17 ENCOUNTER — Ambulatory Visit: Payer: BLUE CROSS/BLUE SHIELD | Admitting: Nurse Practitioner

## 2018-05-17 DIAGNOSIS — C50211 Malignant neoplasm of upper-inner quadrant of right female breast: Secondary | ICD-10-CM | POA: Diagnosis not present

## 2018-05-17 DIAGNOSIS — Z170 Estrogen receptor positive status [ER+]: Secondary | ICD-10-CM | POA: Diagnosis not present

## 2018-05-18 ENCOUNTER — Ambulatory Visit: Payer: BLUE CROSS/BLUE SHIELD

## 2018-05-24 ENCOUNTER — Telehealth: Payer: Self-pay

## 2018-05-24 ENCOUNTER — Other Ambulatory Visit: Payer: Self-pay | Admitting: Hematology

## 2018-05-24 MED ORDER — BACLOFEN 5 MG PO TABS: 5.0000 mg | ORAL_TABLET | Freq: Three times a day (TID) | ORAL | 1 refills | Status: DC | PRN

## 2018-05-24 NOTE — Telephone Encounter (Signed)
Patient calls stating she has been having bilateral eye twitching for several weeks. Wants advice on what she should do.  Is it coming from her treatments?  Advised patient I will speak with Dr. Burr Medico and call her back.

## 2018-05-24 NOTE — Telephone Encounter (Signed)
I called her back, and suggested warm compress on her eyes, and also call the baclofen 5 to 10 mg every 8 hours as needed, she will try at night.  Potential side effects were discussed, voiced good understanding.  Truitt Merle MD

## 2018-05-25 DIAGNOSIS — C773 Secondary and unspecified malignant neoplasm of axilla and upper limb lymph nodes: Secondary | ICD-10-CM | POA: Diagnosis not present

## 2018-05-25 DIAGNOSIS — Z800 Family history of malignant neoplasm of digestive organs: Secondary | ICD-10-CM | POA: Diagnosis not present

## 2018-05-25 DIAGNOSIS — C50919 Malignant neoplasm of unspecified site of unspecified female breast: Secondary | ICD-10-CM | POA: Diagnosis not present

## 2018-05-25 DIAGNOSIS — C50211 Malignant neoplasm of upper-inner quadrant of right female breast: Secondary | ICD-10-CM | POA: Diagnosis not present

## 2018-05-27 ENCOUNTER — Telehealth: Payer: Self-pay

## 2018-05-27 ENCOUNTER — Telehealth: Payer: Self-pay | Admitting: Hematology

## 2018-05-27 ENCOUNTER — Other Ambulatory Visit: Payer: Self-pay | Admitting: Hematology

## 2018-05-27 MED ORDER — FUROSEMIDE 20 MG PO TABS: 20.0000 mg | ORAL_TABLET | Freq: Every day | ORAL | 1 refills | Status: DC

## 2018-05-27 NOTE — Telephone Encounter (Signed)
I called patient back, she reports swelling in her hands and feet for the past 3 weeks, likely related to chemotherapy and or dexamethasone, I will call in furosemide 20 mg daily for her, and she will take potassium chloride 20 mEq daily along with Lasix.  She is scheduled to see [place] back for chemo next week.  Truitt Merle  05/27/2018

## 2018-05-27 NOTE — Telephone Encounter (Signed)
Patient calls stating she has concerns.  Her fingers and hands are swollen for 2 to 3 weeks now and lower extremities as well.  Her finger nails are turning dark.  She would like Dr. Burr Medico to call her at [phone].

## 2018-05-31 NOTE — Progress Notes (Signed)
[place]   [phone] [phone]   Clinic Follow Up Note   Patient Care Team: Lujean Amel, MD as PCP - General (Family Medicine) Fanny Skates, MD as Consulting Physician (General Surgery) Truitt Merle, MD as Consulting Physician (Hematology) Eppie Gibson, MD as Attending Physician (Radiation Oncology)   Date of Service:  06/02/2018  CHIEF COMPLAINTS:  F/u for stage IB breast cancer  Oncology History   Cancer Staging Malignant neoplasm of upper-inner quadrant of right breast in female, estrogen receptor positive ([place]) Staging form: Breast, AJCC 8th Edition - Clinical stage from 03/08/2018: Stage IB (cT2, cN1, cM0, G3, ER+, PR+, HER2+) - Signed by Truitt Merle, MD on 03/16/2018       Malignant neoplasm of upper-inner quadrant of right breast in female, estrogen receptor positive ([place])   02/24/2018 Mammogram    screening mammogram on 02/24/2018 that was benign    03/04/2018 Mammogram    diagnostic mammogram on 03/04/2018 due to a palpable mass on her right breast. Diagnostic mammogram, breast US revealed and biopsy revealed suspicious mass in the right breast at 2:30 at the palpable site of concern and one suspicious right axillary LN.    03/08/2018 Cancer Staging    Staging form: Breast, AJCC 8th Edition - Clinical stage from 03/08/2018: Stage IB (cT2, cN1, cM0, G3, ER+, PR+, HER2+) - Signed by Truitt Merle, MD on 03/16/2018    03/08/2018 Receptors her2    Her2 positive, ER 90% PR 5% and Ki67 60%    03/08/2018 Pathology Results    Diagnosis 1. Breast, right, needle core biopsy, 2:30 - INVASIVE DUCTAL CARCINOMA, GRADE 3. - LYMPHOVASCULAR SPACE INVOLVEMENT BY TUMOR. 2. Lymph node, needle/core biopsy, right axilla - METASTATIC CARCINOMA INVOLVING SCANT LYMPHOID TISSUE.    03/10/2018 Initial Diagnosis    Malignant neoplasm of upper-inner quadrant of right breast in female, estrogen receptor positive ([place])    03/20/2018 Imaging    MRI brain  03/20/18 IMPRESSION: No evidence of metastatic disease.  Normal appearance of brain. Slightly heterogeneous marrow pattern of the upper cervical spine, nonspecific but likely benign, especially in the setting of early stage disease.     03/24/2018 Echocardiogram    Baseline ECHO 03/24/18 Study Conclusions - Left ventricle: The cavity size was normal. Wall thickness was   increased in a pattern of mild LVH. Systolic function was normal.   The estimated ejection fraction was in the range of 60% to 65%.   Wall motion was normal; there were no regional wall motion   abnormalities. Doppler parameters are consistent with abnormal   left ventricular relaxation (grade 1 diastolic dysfunction). - Right ventricle: The cavity size was mildly dilated. Wall   thickness was normal.    03/25/2018 Imaging    MRI Breast B/l 03/25/18 IMPRESSION: 1. The patient's primary malignancy in the posterior right breast measures 3.2 x 2 x 2.6 cm with apparent invasion of the underlying pectoralis muscle. Numerous surrounding abnormal enhancing masses, consistent with satellite lesions, extend from 11 o'clock in the right breast into the inferolateral right breast with a total span of suspected disease measuring 4.5 x 4.6 x 6.4 cm in AP, transverse, and craniocaudal dimension. The suspected extent of disease involves the superior and inferolateral quadrants. The primary malignancy also extends just across midline into the superior medial quadrant. 2. No MRI evidence of malignancy in the left breast. 3. Known metastatic noted in the right axilla.    03/25/2018 Imaging    Whole body bone scan 03/25/18 IMPRESSION:  No scintigraphic evidence skeletal metastasis.    03/25/2018 Imaging    CT CAP W Contrast 03/25/18 IMPRESSION: 1. Mass within the deep aspect of the right breast along the pectoralis muscle compatible with primary breast malignancy. 2. There is suggestion of two additional possible masses within  the lateral aspect of the right breast versus nodular appearing breast tissue. Recommend dedicated evaluation with bilateral breast MRI. 3. Mildly thickened right axillary lymph node compatible with metastatic adenopathy, recently biopsied. 4. Multiple bilateral pulmonary nodules are indeterminate, potentially sequelae of prior infectious/inflammatory process. Recommend attention on follow-up. Consider follow-up CT in 6 months. 5. Portal venous gas is demonstrated within the left hepatic lobe. Additionally, there is wall thickening of the cecum and ascending colon with small amount of gas within the pericolonic vasculature. Constellation of findings is indeterminate in etiology however may be secondary to colitis at this location with considerations including infectious, inflammatory or ischemic etiologies. Correlate for recent procedure. If patient is not up-to-date for colonic screening, recommend further evaluation with colonoscopy to exclude the possibility of colonic mass within the cecum/ascending colon. 6. These results were called by telephone at the time of interpretation on 03/25/2018 at 9:31 am to Dr. Truitt Merle , who verbally acknowledged these results.    03/30/2018 -  Chemotherapy    Neoadjuvant TCHP every 3 weeks starting 03/30/18     Genetic Testing    Negative genetic testing on the [facility] Common Hereditary Cancers Panel. The Common Hereditary Cancers Panel offered by [facility] includes sequencing and/or deletion duplication testing of the following 47 genes: APC, ATM, AXIN2, BARD1, BMPR1A, BRCA1, BRCA2, BRIP1, CDH1, CDKN2A (p14ARF), CDKN2A (p16INK4a), CKD4, CHEK2, CTNNA1, DICER1, EPCAM (Deletion/duplication testing only), GREM1 (promoter region deletion/duplication testing only), KIT, MEN1, MLH1, MSH2, MSH3, MSH6, MUTYH, NBN, NF1, NHTL1, PALB2, PDGFRA, PMS2, POLD1, POLE, PTEN, RAD50, RAD51C, RAD51D, SDHB, SDHC, SDHD, SMAD4, SMARCA4. STK11, TP53, TSC1, TSC2, and VHL.  The following  genes were evaluated for sequence changes only: SDHA and HOXB13 c.251G>A variant only.   Genetic testing did detect a Variant of Unknown Significance (VUS) in the POLD1 gene called c.985C>T (p.Pro329Ser). At this time, it is unknown if this variant is associated with increased cancer risk or if this is a normal finding, but most variants such as this get reclassified to being inconsequential. It should not be used to make medical management decisions.   The report date is 04/09/2018.     HISTORY OF PRESENTING ILLNESS:  Claire Guzman 52 y.o. female is here because of newly diagnosed stage IB breast cancer. She had a screening mammogram on 02/24/2018 that was benign. She later had a diagnostic mammogram on 03/04/2018 due to a palpable mass on her right breast. Diagnostic mammogram, breast US revealed and biopsy revealed suspicious mass in the right breast at 2:30 at the palpable site of concern and one suspicious right axillary LN. Biopsy revealed invasive breast cancer with metastasis to one LN.  Today, she is here with her family members. She is feeling well. She complains of smelling smoke and ammonia at home. She denies having sinus problems. She also complains of dizziness that's been there for some time. She denies headaches, or change in vision. She has tinnitus, that she thinks is getting worse.  She denies new breast symptoms. She denies pain or weight changes. No abdmonial pain or discomfort.  She has HTN for which she takes medications. She takes Effexor for depression and anxiety.   Grandfather had colon cancer. Father had prostate. Mother had  melanoma. Uncles had colon cancer.  She works in Press [employer].    GYN HISTORY  Menarchal: 15 LMP: 02/15/2018, irregular Contraceptive: She is on Levonorgestrel-ethinyl estradiol. HRT: None GP: 1 child, age 28 at first delivery     CURRENT THERAPY: Neoadjuvant TCHP every 3 weeks starting 03/30/18, plan for 6 cycles, carbo dose reduction from cycle  2 due to poor tolerance    INTERVAL HISTORY  Claire Guzman is here for a follow up. She previously reported swelling in her hands and feet and bilateral eyelid twitching.  Today, she is here alone. She states that the water pill didn't relief her symptoms.  She states that she gets short of breath when she climbs stairs, and finds it difficult to raise her legs. She also experiences fatigue with chemotherapy. Her job is very physically demanding, and her employer is informed and aware of her condition. She sometimes experienced abdominal cramping. She has constipation alternating with diarrhea with her nausea medication. She feels down. She has bone pain after treatment that she treats with tramadol. She doesn't take steroids, and states that her rash has improved, but she still has residuals.    MEDICAL HISTORY:  Past Medical History:  Diagnosis Date  . Abdominal pain, unspecified site   . Anxiety state, unspecified   . Cancer [facility])    R breast cancer  . Contact dermatitis and other eczema, due to unspecified cause   . Essential hypertension, benign   . Family history of colon cancer   . Family history of melanoma   . Family history of prostate cancer   . GERD (gastroesophageal reflux disease)   . Hematuria, unspecified   . Hypertension   . Lower back pain   . Mixed hyperlipidemia   . Rosacea   . Unspecified symptom associated with female genital organs     SURGICAL HISTORY: Past Surgical History:  Procedure Laterality Date  . CESAREAN SECTION    . DIAGNOSTIC LAPAROSCOPY    . LAPAROSCOPIC APPENDECTOMY    . PORTACATH PLACEMENT Left 04/13/2018   Procedure: INSERTION PORT-A-CATH WITH [place];  Surgeon: Fanny Skates, MD;  Location: [place];  Service: General;  Laterality: Left;  . UTERINE FIBROID SURGERY      SOCIAL HISTORY: Social History   Socioeconomic History  . Marital status: Married    Spouse name: Not on file  . Number of children: Not on file   . Years of education: Not on file  . Highest education level: Not on file  Occupational History  . Not on file  Social Needs  . Financial resource strain: Not on file  . Food insecurity:    Worry: Not on file    Inability: Not on file  . Transportation needs:    Medical: Not on file    Non-medical: Not on file  Tobacco Use  . Smoking status: Never Smoker  . Smokeless tobacco: Never Used  Substance and Sexual Activity  . Alcohol use: No  . Drug use: Never  . Sexual activity: Not on file  Lifestyle  . Physical activity:    Days per week: Not on file    Minutes per session: Not on file  . Stress: Not on file  Relationships  . Social connections:    Talks on phone: Not on file    Gets together: Not on file    Attends religious service: Not on file    Active member of club or organization: Not on file    Attends  meetings of clubs or organizations: Not on file    Relationship status: Not on file  . Intimate partner violence:    Fear of current or ex partner: Not on file    Emotionally abused: Not on file    Physically abused: Not on file    Forced sexual activity: Not on file  Other Topics Concern  . Not on file  Social History Narrative  . Not on file    FAMILY HISTORY: Family History  Problem Relation Age of Onset  . Heart failure Mother   . Melanoma Mother 54       has had several removed over the past ten years  . Prostate cancer Father 40  . Heart attack Maternal Grandmother   . Heart attack Maternal Grandfather        possible cancer, unknown type  . Stroke Paternal Grandmother   . Colon cancer Paternal Grandfather        dx upper 34s  . Hypertension Unknown   . Heart Problems Maternal Aunt        d.60  . Heart Problems Maternal Uncle        d.60  . Colon cancer Paternal Aunt        dx 30s  . Colon cancer Paternal Uncle        dx 74s    ALLERGIES:  is allergic to metrogel [metronidazole]; other; paxil [paroxetine hcl]; prednisone; tramadol; and  zomig [zolmitriptan].  MEDICATIONS:  Current Outpatient Medications  Medication Sig Dispense Refill  . ALPRAZolam (XANAX) 0.5 MG tablet Take 1 tablet (0.5 mg total) by mouth at bedtime as needed for anxiety. 30 tablet 0  . atenolol (TENORMIN) 50 MG tablet Take 50 mg by mouth at bedtime.    . Baclofen 5 MG TABS Take 5-10 mg by mouth every 8 (eight) hours as needed (eye lid spasm). 30 tablet 1  . dexamethasone (DECADRON) 4 MG tablet Take 1 tablet (4 mg total) by mouth 2 (two) times daily. Start the day before Taxotere. Take once the day after, then 2 times a day x 2d. 30 tablet 1  . doxycycline (VIBRA-TABS) 100 MG tablet Take 1 tablet (100 mg total) by mouth 2 (two) times daily. 60 tablet 0  . furosemide (LASIX) 20 MG tablet Take 1 tablet (20 mg total) by mouth daily. 20 tablet 1  . HYDROcodone-acetaminophen (NORCO) 5-325 MG tablet Take 1-2 tablets by mouth every 6 (six) hours as needed for moderate pain or severe pain. 20 tablet 0  . HYDROcodone-acetaminophen (NORCO/VICODIN) 5-325 MG tablet Take 1 tablet by mouth every 6 (six) hours as needed for severe pain. 20 tablet 0  . lidocaine-prilocaine (EMLA) cream Apply to affected area once 30 g 3  . omeprazole (PRILOSEC) 20 MG capsule Take 1 capsule (20 mg total) by mouth 2 (two) times daily before a meal. 60 capsule 2  . ondansetron (ZOFRAN) 8 MG tablet Take 1 tablet (8 mg total) by mouth 2 (two) times daily as needed (Nausea or vomiting). Begin 4 days after chemotherapy. 30 tablet 1  . potassium chloride SA (K-DUR,KLOR-CON) 20 MEQ tablet TAKE 1 TABLET(20 MEQ) BY MOUTH TWICE DAILY 30 tablet 0  . prochlorperazine (COMPAZINE) 10 MG tablet TAKE 1 TABLET BY MOUTH EVERY 6 HOURS AS NEEDED FOR NAUSEA OR VOMITING 30 tablet 0  . traMADol (ULTRAM) 50 MG tablet Take 1 tablet (50 mg total) by mouth every 12 (twelve) hours as needed. 30 tablet 0  . triamcinolone ointment (KENALOG) 0.5 %  Apply 1 application topically 2 (two) times daily. 30 g 1  . venlafaxine  (EFFEXOR) 37.5 MG tablet Take 37.5 mg by mouth 2 (two) times daily.     No current facility-administered medications for this visit.    Facility-Administered Medications Ordered in Other Visits  Medication Dose Route Frequency Provider Last Rate Last Dose  . sodium chloride flush (NS) 0.9 % injection 10 mL  10 mL Intracatheter PRN Truitt Merle, MD   10 mL at 06/02/18 1529    REVIEW OF SYSTEMS:  Constitutional: Denies fevers, chills or abnormal night sweats  Eyes: Denies blurriness of vision, double vision or watery eyes  Ears, nose, mouth, throat, and face: No new complaints Respiratory: Denies cough, dyspnea or wheezes Cardiovascular: Denies palpitation, chest discomfort or lower extremity swelling Gastrointestinal: No constipation or blood in stool (+) constipation alternates with diarrhea (+) abdominal cramping (+) occasional nausea  GU: No new frequency, urgency, or dysuria Skin: (+) skin rash on back, shoulders, scalp, and face, near resolved now (+) blisters on hands, improved (+) dark fingernails Lymphatics: Denies new lymphadenopathy or easy bruising Neurological:Denies numbness, tingling or new weaknesses Breast: No new palpable masses in breast or skin changes  MSK: No new joint pain or myalgia Behavioral/Psych: Mood is stable, no new changes  All other systems were reviewed with the patient and are negative.  PHYSICAL EXAMINATION:  ECOG PERFORMANCE STATUS: 1 Vitals:   06/02/18 1028  BP: 140/62  Pulse: 92  Resp: 16  Temp: 97.8 F (36.6 C)  TempSrc: Oral  SpO2: 100%  Weight: 166 lb 3.2 oz (75.4 kg)  Height: '5\' 4"'$  (1.626 m)    GENERAL:alert, no distress and comfortable SKIN: skin color, texture, turgor are normal, (+) mild diffuse skin erythema on her back, shoulder, upper chest, no other significant skin rashes, improving (+) blisters on bilateral hands, improving (+) dark fingernails EYES: normal, conjunctiva are pink and non-injected, sclera clear OROPHARYNX:no  exudate, no erythema and lips, buccal mucosa, and tongue normal  NECK: supple, thyroid normal size, non-tender, without nodularity LYMPH:  no palpable lymphadenopathy in the cervical or inguinal  LUNGS: clear to auscultation and percussion with normal breathing effort HEART: regular rate & rhythm and no murmurs and no lower extremity edema ABDOMEN:abdomen soft, non-tender and normal bowel sounds Musculoskeletal:no cyanosis of digits and no clubbing  Breast: no palpable masses or ski nchanges PSYCH: alert & oriented x 3 with fluent speech NEURO: no focal motor/sensory deficits  LABORATORY DATA:  I have reviewed the data as listed CBC Latest Ref Rng & Units 06/02/2018 05/11/2018 05/03/2018  WBC 4.0 - 10.5 K/uL 4.3 6.3 13.3(H)  Hemoglobin 12.0 - 15.0 g/dL 11.1(L) 10.9(L) 11.2(L)  Hematocrit 36.0 - 46.0 % 32.6(L) 31.1(L) 31.9(L)  Platelets 150 - 400 K/uL 163 215 172    CMP Latest Ref Rng & Units 06/02/2018 05/11/2018 05/03/2018  Glucose 70 - 99 mg/dL 135(H) 115(H) 90  BUN 6 - 20 mg/dL '9 9 8  '$ Creatinine 0.44 - 1.00 mg/dL 0.66 0.62 0.64  Sodium 135 - 145 mmol/L 142 142 140  Potassium 3.5 - 5.1 mmol/L 3.4(L) 3.5 3.7  Chloride 98 - 111 mmol/L 106 106 105  CO2 22 - 32 mmol/L '27 28 27  '$ Calcium 8.9 - 10.3 mg/dL 8.9 9.2 9.1  Total Protein 6.5 - 8.1 g/dL 6.5 6.4(L) 6.8  Total Bilirubin 0.3 - 1.2 mg/dL 0.4 0.4 0.4  Alkaline Phos 38 - 126 U/L 95 111 138(H)  AST 15 - 41 U/L 22 21 23  ALT 0 - 44 U/L '17 15 15    '$ PATHOLOGY:   03/17/2018 Molecular Pathology   03/08/2018 Surgical Pathology ADDITIONAL INFORMATION: 1. PROGNOSTIC INDICATORS Results: IMMUNOHISTOCHEMICAL AND MORPHOMETRIC ANALYSIS PERFORMED MANUALLY The tumor cells are positive for Her2 (3+). Estrogen Receptor: 90%, POSITIVE, STRONG STAINING INTENSITY Progesterone Receptor: 5%, POSITIVE, STRONG STAINING INTENSITY Proliferation Marker Ki67: 60% REFERENCE RANGE ESTROGEN RECEPTOR NEGATIVE 0% POSITIVE =>1% REFERENCE RANGE  PROGESTERONE RECEPTOR NEGATIVE 0% POSITIVE =>1% All controls stained appropriately Diagnosis 1. Breast, right, needle core biopsy, 2:30 - INVASIVE DUCTAL CARCINOMA, GRADE 3. - LYMPHOVASCULAR SPACE INVOLVEMENT BY TUMOR. 2. Lymph node, needle/core biopsy, right axilla - METASTATIC CARCINOMA INVOLVING SCANT LYMPHOID TISSUE. Synoptic Report 1. Breast prognostic profile will be performed. Dr. Vic Ripper agrees. Called to [facility] on 03/09/18. (JDP:gt, 03/09/18) Specimen Comment 1. Formalin 1:30 PM; right breast mass 2. Axillary adenopathy Specimen(s) Obtained: 1. Breast, right, needle core biopsy, 2:30 2. Lymph node, needle/core biopsy, right axilla Specimen Clinical Information 1. R/O IMC 2. R/O axillary metastasis Gross 1. Received in formalin (time in formalin 1:30 p.m., cold ischemic time unknown) are two cores of soft tan yellow tissue measuring 1.3 and 1.8 cm in length and each 0.2 cm in diameter. The specimen is submitted in toto. 2. Received in formalin (time in formalin 1:30 p.m., cold ischemic time unknown) is a 1.3 x 0.2 cm core of soft hemorrhagic tissue and a separate 0.2 cm soft tan yellow tissue fragment. The specimen is submitted in toto. (GP:gt, 03/09/18) Stain(s) used in Diagnosis: The following stain(s) were used in diagnosing the case: ER-ACIS, PR-ACIS, Her2 by IHC, KI-67-ACIS. The control(s) stained appropriately.   PROCEDURES  Baseline ECHO 03/24/18 Study Conclusions - Left ventricle: The cavity size was normal. Wall thickness was   increased in a pattern of mild LVH. Systolic function was normal.   The estimated ejection fraction was in the range of 60% to 65%.   Wall motion was normal; there were no regional wall motion   abnormalities. Doppler parameters are consistent with abnormal   left ventricular relaxation (grade 1 diastolic dysfunction). - Right ventricle: The cavity size was mildly dilated. Wall   thickness was  normal.  RADIOGRAPHIC STUDIES: I have personally reviewed the radiological images as listed and agreed with the findings in the report.  MRI Breast B/l 03/25/18 IMPRESSION: 1. The patient's primary malignancy in the posterior right breast measures 3.2 x 2 x 2.6 cm with apparent invasion of the underlying pectoralis muscle. Numerous surrounding abnormal enhancing masses, consistent with satellite lesions, extend from 11 o'clock in the right breast into the inferolateral right breast with a total span of suspected disease measuring 4.5 x 4.6 x 6.4 cm in AP, transverse, and craniocaudal dimension. The suspected extent of disease involves the superior and inferolateral quadrants. The primary malignancy also extends just across midline into the superior medial quadrant. 2. No MRI evidence of malignancy in the left breast. 3. Known metastatic noted in the right axilla.  Whole body bone scan 03/25/18 IMPRESSION: No scintigraphic evidence skeletal metastasis.  CT CAP W Contrast 03/25/18 IMPRESSION: 1. Mass within the deep aspect of the right breast along the pectoralis muscle compatible with primary breast malignancy. 2. There is suggestion of two additional possible masses within the lateral aspect of the right breast versus nodular appearing breast tissue. Recommend dedicated evaluation with bilateral breast MRI. 3. Mildly thickened right axillary lymph node compatible with metastatic adenopathy, recently biopsied. 4. Multiple bilateral pulmonary nodules are indeterminate, potentially sequelae of  prior infectious/inflammatory process. Recommend attention on follow-up. Consider follow-up CT in 6 months. 5. Portal venous gas is demonstrated within the left hepatic lobe. Additionally, there is wall thickening of the cecum and ascending colon with small amount of gas within the pericolonic vasculature. Constellation of findings is indeterminate in etiology however may be secondary to  colitis at this location with considerations including infectious, inflammatory or ischemic etiologies. Correlate for recent procedure. If patient is not up-to-date for colonic screening, recommend further evaluation with colonoscopy to exclude the possibility of colonic mass within the cecum/ascending colon. 6. These results were called by telephone at the time of interpretation on 03/25/2018 at 9:31 am to Dr. Truitt Merle , who verbally acknowledged these results.  MRI brain 03/20/18 IMPRESSION: No evidence of metastatic disease.  Normal appearance of brain. Slightly heterogeneous marrow pattern of the upper cervical spine, nonspecific but likely benign, especially in the setting of early stage disease.  03/04/2018 Diagnostic Mammogram and Breast US IMPRESSION: 1. There is a suspicious mass in the right breast at 2:30 at the palpable site of concern.  2.  There is 1 suspicious lymph node in the right axilla.  02/24/2018 Screening Mammogram   ASSESSMENT & PLAN:  Claire Guzman is a 52 y.o. lovely female with a history of   1.Malignant neoplasm of upper-inner quadrant of right breast in female, invasive ductal carcinoma, cT2N1M0 stage IB, ER+/PR+/HER2+, G3  -She previously presented with a palpable right breast mass on exam.  She had had a screening mammogram on 02/24/2018 that was negative Breast US revealed a and biopsy revealed a 2.1 cm mass right breast, and one suspicious right axillary LN. -Biopsy revealed invasive breast cancer with metastasis to one LN. -Immunohistochemistry revealed that the tumor is Her2 positive, ER 90% PR 5% and Ki67 60%. -her risk of recurrence is high. I previously recommended neoadjuvant or adjuvant chemotherapy with TCHP (docetaxel, carboplatin, Herceptin and pejeta every 3 weeks for total of 6 weeks, followed by maintenance Herceptin and Perjeta or adjuvant (TDM1). --Her 02/2018 staging scans showed no evidence of distant metastasis, with multiple  indeterminate pulmonary nodules, colonic wall thickning, we will follow-up on that.  -her breast MRI showed additional satellite masses spanning 4.5 x 4.6 x 6.4 cm in right breast, left breast is benign. I reviewed this with pt previously.  She is scheduled to have additional right breast mass biopsy this week.  However patient prefers mastectomy anyway, so I canceled her additional biopsy.  Dr. Dalbert Batman agreed with it.  -Her baseline ECHO from 03/24/18 showed adequate EF at 60-65% -She started neoadjuvant chemo TCHP every 3 weeks on 03/30/18 -She tolerated the first cycle chemotherapy poorly with significant body aches, hip pain, skin rash, soar throat, and indigestion, and severe diarrhea which improved with Imodium.  She has recovered very well.  We again reviewed the management of the side effects. -Due to her tolerance issue, I previously decreased her carboplatin to AUC 5 from cycle 2, and continue the current full dose of docetaxel, Herceptin and pejeta, she tolerated much better, with body aches for 2 days, mild dehydration, no significant nausea or diarrhea, overall much more tolerable. She has persistent body pain, fatigue, and now developed edema on her hands and lower extremities, eye twitches, etc. but she is still able to function well at home, and works most the time. --Labs reviewed, CBC showed Hg 11.1 WBC 4.3 Platelet 163K. CMP showed K 3.4.  Adequate for treatment, will proceed to cycle 4 TCH P  at same dose. -Plan to complete a total of 6 cycle neoadjuvant chemo, followed by maintenance Herceptin and pejeta. -If she has significant residual disease after neoadjuvant chemo, will consider adjuvant Kadcyla -We reviewed the time course of surgery, radiation, and adjuvant antiestrogen and HER-2 antibody therapy today, she voiced good understanding. -she is not taking dexa pre and post chemo due to the concerns of side effects   2.Genetics  -She has no family history of breast cancer, due to her  relatively young age of breast cancer, she is a candidate for genetic testing. She expressed interest -Genetic testing performed through [facility]'s Common Hereditary Cancers Panel reported out on 04/12/2018 showed no pathogenic mutations. The Common Hereditary Cancers Panel offered by [facility] includes sequencing and/or deletion duplication testing of the following 47 genes: APC, ATM, AXIN2, BARD1, BMPR1A, BRCA1, BRCA2, BRIP1, CDH1, CDKN2A (p14ARF), CDKN2A (p16INK4a), CKD4, CHEK2, CTNNA1, DICER1, EPCAM (Deletion/duplication testing only), GREM1 (promoter region deletion/duplication testing only), KIT, MEN1, MLH1, MSH2, MSH3, MSH6, MUTYH, NBN, NF1, NHTL1, PALB2, PDGFRA, PMS2, POLD1, POLE, PTEN, RAD50, RAD51C, RAD51D, SDHB, SDHC, SDHD, SMAD4, SMARCA4. STK11, TP53, TSC1, TSC2, and VHL.  The following genes were evaluated for sequence changes only: SDHA and HOXB13 c.251G>A variant only. A variant of uncertain significance (VUS) in a gene called POLD1 was also noted.   3. Anxiety  -She has history of anxiety, currently on Xanax as needed  4. HTN -She will continue atenolol. We we will monitor her blood pressure closely during the chemotherapy, and adjust her medication as needed.  5. Skin rash -Red rash on her back, upper chest, face and scalp, likely related to Herceptin and pejeta -She denies being in the sun -She tried over-the-counter hydrocortisone cream, did not help much, I called in doxycycline, which cleared the rash. -Her rash has much improved after cycle 2, she has mild skin edema and peeling on her hands   6. Body pain -Secondary to chemotherapy and Neulasta -She will use tramadol and norco as needed for severe pain -Improved after chemo dose reduction from cycle 2  7.  Bilateral small lung nodules -Her restaging CT scan showed bilateral multiple small lung nodules, 3 to 4 mm, indeterminate, likely benign.  She is a never smoker -Plan to repeat CT chest without contrast in 5 months  8.  Hand and leg edema -Mild, and symmetric, likely related to steroids and chemo, unlikely DVT. -She tried Lasix 20 mg daily to twice daily, did not help her much. -I encouraged her to use compression stockings during the day  9. Eye lid twitching -Likely related to chemotherapy, she tried baclofen, which helps some  Plan: -labs reviewed, good to continue with cycle 4 TCHP  today -f/u in 3 week before cycle 5 chemo    All questions were answered. The patient knows to call the clinic with any problems, questions or concerns. I spent 25 minutes counseling the patient face to face. The total time spent in the appointment was 30 minutes and more than 50% was on counseling.  Dierdre Searles Dweik am acting as scribe for Dr. Truitt Merle.  I have reviewed the above documentation for accuracy and completeness, and I agree with the above.      Truitt Merle, MD 06/02/2018

## 2018-06-01 ENCOUNTER — Other Ambulatory Visit: Payer: Self-pay | Admitting: Hematology

## 2018-06-01 DIAGNOSIS — C50211 Malignant neoplasm of upper-inner quadrant of right female breast: Secondary | ICD-10-CM

## 2018-06-01 DIAGNOSIS — Z170 Estrogen receptor positive status [ER+]: Secondary | ICD-10-CM

## 2018-06-02 ENCOUNTER — Inpatient Hospital Stay: Payer: BLUE CROSS/BLUE SHIELD

## 2018-06-02 ENCOUNTER — Inpatient Hospital Stay: Payer: BLUE CROSS/BLUE SHIELD | Attending: Hematology

## 2018-06-02 ENCOUNTER — Inpatient Hospital Stay (HOSPITAL_BASED_OUTPATIENT_CLINIC_OR_DEPARTMENT_OTHER): Payer: BLUE CROSS/BLUE SHIELD | Admitting: Hematology

## 2018-06-02 ENCOUNTER — Telehealth: Payer: Self-pay | Admitting: Hematology

## 2018-06-02 ENCOUNTER — Encounter: Payer: Self-pay | Admitting: Hematology

## 2018-06-02 VITALS — BP 140/62 | HR 92 | Temp 97.8°F | Resp 16 | Ht 64.0 in | Wt 166.2 lb

## 2018-06-02 DIAGNOSIS — C50211 Malignant neoplasm of upper-inner quadrant of right female breast: Secondary | ICD-10-CM | POA: Diagnosis not present

## 2018-06-02 DIAGNOSIS — R21 Rash and other nonspecific skin eruption: Secondary | ICD-10-CM | POA: Insufficient documentation

## 2018-06-02 DIAGNOSIS — M79604 Pain in right leg: Secondary | ICD-10-CM | POA: Insufficient documentation

## 2018-06-02 DIAGNOSIS — R110 Nausea: Secondary | ICD-10-CM | POA: Diagnosis not present

## 2018-06-02 DIAGNOSIS — K30 Functional dyspepsia: Secondary | ICD-10-CM | POA: Diagnosis not present

## 2018-06-02 DIAGNOSIS — R600 Localized edema: Secondary | ICD-10-CM

## 2018-06-02 DIAGNOSIS — Z5189 Encounter for other specified aftercare: Secondary | ICD-10-CM | POA: Insufficient documentation

## 2018-06-02 DIAGNOSIS — R918 Other nonspecific abnormal finding of lung field: Secondary | ICD-10-CM | POA: Insufficient documentation

## 2018-06-02 DIAGNOSIS — F419 Anxiety disorder, unspecified: Secondary | ICD-10-CM

## 2018-06-02 DIAGNOSIS — I10 Essential (primary) hypertension: Secondary | ICD-10-CM | POA: Insufficient documentation

## 2018-06-02 DIAGNOSIS — Z170 Estrogen receptor positive status [ER+]: Secondary | ICD-10-CM

## 2018-06-02 DIAGNOSIS — R609 Edema, unspecified: Secondary | ICD-10-CM | POA: Insufficient documentation

## 2018-06-02 DIAGNOSIS — Z95828 Presence of other vascular implants and grafts: Secondary | ICD-10-CM

## 2018-06-02 DIAGNOSIS — Z5112 Encounter for antineoplastic immunotherapy: Secondary | ICD-10-CM | POA: Diagnosis not present

## 2018-06-02 DIAGNOSIS — L609 Nail disorder, unspecified: Secondary | ICD-10-CM | POA: Insufficient documentation

## 2018-06-02 DIAGNOSIS — R253 Fasciculation: Secondary | ICD-10-CM | POA: Insufficient documentation

## 2018-06-02 DIAGNOSIS — M79605 Pain in left leg: Secondary | ICD-10-CM | POA: Diagnosis not present

## 2018-06-02 DIAGNOSIS — Z5111 Encounter for antineoplastic chemotherapy: Secondary | ICD-10-CM | POA: Insufficient documentation

## 2018-06-02 DIAGNOSIS — Z1379 Encounter for other screening for genetic and chromosomal anomalies: Secondary | ICD-10-CM

## 2018-06-02 LAB — CMP (CANCER CENTER ONLY)
ALBUMIN: 3.5 g/dL (ref 3.5–5.0)
ALK PHOS: 95 U/L (ref 38–126)
ALT: 17 U/L (ref 0–44)
ANION GAP: 9 (ref 5–15)
AST: 22 U/L (ref 15–41)
BUN: 9 mg/dL (ref 6–20)
CO2: 27 mmol/L (ref 22–32)
Calcium: 8.9 mg/dL (ref 8.9–10.3)
Chloride: 106 mmol/L (ref 98–111)
Creatinine: 0.66 mg/dL (ref 0.44–1.00)
GFR, Est AFR Am: >60 mL/min (ref >60)
GFR, Estimated: >60 mL/min (ref >60)
Glucose, Bld: 135 mg/dL — ABNORMAL HIGH (ref 70–99)
POTASSIUM: 3.4 mmol/L — AB (ref 3.5–5.1)
SODIUM: 142 mmol/L (ref 135–145)
Total Bilirubin: 0.4 mg/dL (ref 0.3–1.2)
Total Protein: 6.5 g/dL (ref 6.5–8.1)

## 2018-06-02 LAB — CBC WITH DIFFERENTIAL (CANCER CENTER ONLY)
Abs Immature Granulocytes: 0.01 10*3/uL (ref 0.00–0.07)
BASOS ABS: 0 10*3/uL (ref 0.0–0.1)
Basophils Relative: 1 %
Eosinophils Absolute: 0 10*3/uL (ref 0.0–0.5)
Eosinophils Relative: 0 %
HEMATOCRIT: 32.6 % — AB (ref 36.0–46.0)
HEMOGLOBIN: 11.1 g/dL — AB (ref 12.0–15.0)
IMMATURE GRANULOCYTES: 0 %
LYMPHS ABS: 1.2 10*3/uL (ref 0.7–4.0)
LYMPHS PCT: 28 %
MCH: 33.7 pg (ref 26.0–34.0)
MCHC: 34 g/dL (ref 30.0–36.0)
MCV: 99.1 fL (ref 80.0–100.0)
Monocytes Absolute: 0.4 10*3/uL (ref 0.1–1.0)
Monocytes Relative: 9 %
NEUTROS PCT: 62 %
NRBC: 0 % (ref 0.0–0.2)
Neutro Abs: 2.7 10*3/uL (ref 1.7–7.7)
Platelet Count: 163 10*3/uL (ref 150–400)
RBC: 3.29 MIL/uL — AB (ref 3.87–5.11)
RDW: 16.6 % — AB (ref 11.5–15.5)
WBC: 4.3 10*3/uL (ref 4.0–10.5)

## 2018-06-02 MED ORDER — SODIUM CHLORIDE 0.9% FLUSH
10.0000 mL | INTRAVENOUS | Status: DC | PRN
  Administered 2018-06-02: 10 mL
  Filled 2018-06-02: qty 10

## 2018-06-02 MED ORDER — SODIUM CHLORIDE 0.9 % IV SOLN
611.5000 mg | Freq: Once | INTRAVENOUS | Status: AC
  Administered 2018-06-02: 610 mg via INTRAVENOUS
  Filled 2018-06-02: qty 61

## 2018-06-02 MED ORDER — SODIUM CHLORIDE 0.9 % IV SOLN
Freq: Once | INTRAVENOUS | Status: AC
  Administered 2018-06-02: 12:00:00 via INTRAVENOUS
  Filled 2018-06-02: qty 5

## 2018-06-02 MED ORDER — PALONOSETRON HCL INJECTION 0.25 MG/5ML
INTRAVENOUS | Status: AC
  Filled 2018-06-02: qty 5

## 2018-06-02 MED ORDER — DIPHENHYDRAMINE HCL 25 MG PO CAPS
ORAL_CAPSULE | ORAL | Status: AC
  Filled 2018-06-02: qty 2

## 2018-06-02 MED ORDER — SODIUM CHLORIDE 0.9 % IV SOLN
420.0000 mg | Freq: Once | INTRAVENOUS | Status: AC
  Administered 2018-06-02: 420 mg via INTRAVENOUS
  Filled 2018-06-02: qty 14

## 2018-06-02 MED ORDER — ACETAMINOPHEN 325 MG PO TABS
ORAL_TABLET | ORAL | Status: AC
  Filled 2018-06-02: qty 2

## 2018-06-02 MED ORDER — HEPARIN SOD (PORK) LOCK FLUSH 100 UNIT/ML IV SOLN
500.0000 [IU] | Freq: Once | INTRAVENOUS | Status: AC | PRN
  Administered 2018-06-02: 500 [IU]
  Filled 2018-06-02: qty 5

## 2018-06-02 MED ORDER — TRASTUZUMAB CHEMO 150 MG IV SOLR
450.0000 mg | Freq: Once | INTRAVENOUS | Status: AC
  Administered 2018-06-02: 450 mg via INTRAVENOUS
  Filled 2018-06-02: qty 21.43

## 2018-06-02 MED ORDER — PALONOSETRON HCL INJECTION 0.25 MG/5ML
0.2500 mg | Freq: Once | INTRAVENOUS | Status: AC
  Administered 2018-06-02: 0.25 mg via INTRAVENOUS

## 2018-06-02 MED ORDER — SODIUM CHLORIDE 0.9 % IV SOLN
75.0000 mg/m2 | Freq: Once | INTRAVENOUS | Status: AC
  Administered 2018-06-02: 140 mg via INTRAVENOUS
  Filled 2018-06-02: qty 14

## 2018-06-02 MED ORDER — ACETAMINOPHEN 325 MG PO TABS
650.0000 mg | ORAL_TABLET | Freq: Once | ORAL | Status: AC
  Administered 2018-06-02: 650 mg via ORAL

## 2018-06-02 MED ORDER — DIPHENHYDRAMINE HCL 25 MG PO CAPS
50.0000 mg | ORAL_CAPSULE | Freq: Once | ORAL | Status: AC
  Administered 2018-06-02: 50 mg via ORAL

## 2018-06-02 MED ORDER — SODIUM CHLORIDE 0.9 % IV SOLN
Freq: Once | INTRAVENOUS | Status: AC
  Administered 2018-06-02: 11:00:00 via INTRAVENOUS
  Filled 2018-06-02: qty 250

## 2018-06-02 NOTE — Telephone Encounter (Signed)
Verified appointment change with patient per Doctor request to move 12/18 appointments to 12/27 per los 11/6

## 2018-06-02 NOTE — Patient Instructions (Signed)
[facility] Discharge Instructions for Patients Receiving Chemotherapy  Today you received the following chemotherapy agents Taxol, Carbo, Herceptin, Perjeta  To help prevent nausea and vomiting after your treatment, we encourage you to take your nausea medication as prescribed.   If you develop nausea and vomiting that is not controlled by your nausea medication, call the clinic.   BELOW ARE SYMPTOMS THAT SHOULD BE REPORTED IMMEDIATELY:  *FEVER GREATER THAN 100.5 F  *CHILLS WITH OR WITHOUT FEVER  NAUSEA AND VOMITING THAT IS NOT CONTROLLED WITH YOUR NAUSEA MEDICATION  *UNUSUAL SHORTNESS OF BREATH  *UNUSUAL BRUISING OR BLEEDING  TENDERNESS IN MOUTH AND THROAT WITH OR WITHOUT PRESENCE OF ULCERS  *URINARY PROBLEMS  *BOWEL PROBLEMS  UNUSUAL RASH Items with * indicate a potential emergency and should be followed up as soon as possible.  Feel free to call the clinic should you have any questions or concerns. The clinic phone number is [phone].  Please show the CHEMO ALERT CARD at check-in to the Emergency Department and triage nurse.   

## 2018-06-03 LAB — CANCER ANTIGEN 27.29: CA 27.29: 47.6 U/mL — ABNORMAL HIGH (ref 0.0–38.6)

## 2018-06-04 ENCOUNTER — Inpatient Hospital Stay: Payer: BLUE CROSS/BLUE SHIELD

## 2018-06-04 VITALS — BP 122/66 | HR 83 | Temp 97.7°F | Resp 16

## 2018-06-04 DIAGNOSIS — R609 Edema, unspecified: Secondary | ICD-10-CM | POA: Diagnosis not present

## 2018-06-04 DIAGNOSIS — I10 Essential (primary) hypertension: Secondary | ICD-10-CM | POA: Diagnosis not present

## 2018-06-04 DIAGNOSIS — R21 Rash and other nonspecific skin eruption: Secondary | ICD-10-CM | POA: Diagnosis not present

## 2018-06-04 DIAGNOSIS — C50211 Malignant neoplasm of upper-inner quadrant of right female breast: Secondary | ICD-10-CM | POA: Diagnosis not present

## 2018-06-04 DIAGNOSIS — M79605 Pain in left leg: Secondary | ICD-10-CM | POA: Diagnosis not present

## 2018-06-04 DIAGNOSIS — Z5112 Encounter for antineoplastic immunotherapy: Secondary | ICD-10-CM | POA: Diagnosis not present

## 2018-06-04 DIAGNOSIS — Z5111 Encounter for antineoplastic chemotherapy: Secondary | ICD-10-CM | POA: Diagnosis not present

## 2018-06-04 DIAGNOSIS — L609 Nail disorder, unspecified: Secondary | ICD-10-CM | POA: Diagnosis not present

## 2018-06-04 DIAGNOSIS — Z170 Estrogen receptor positive status [ER+]: Secondary | ICD-10-CM

## 2018-06-04 DIAGNOSIS — M79604 Pain in right leg: Secondary | ICD-10-CM | POA: Diagnosis not present

## 2018-06-04 DIAGNOSIS — R253 Fasciculation: Secondary | ICD-10-CM | POA: Diagnosis not present

## 2018-06-04 DIAGNOSIS — R918 Other nonspecific abnormal finding of lung field: Secondary | ICD-10-CM | POA: Diagnosis not present

## 2018-06-04 DIAGNOSIS — R110 Nausea: Secondary | ICD-10-CM | POA: Diagnosis not present

## 2018-06-04 DIAGNOSIS — K30 Functional dyspepsia: Secondary | ICD-10-CM | POA: Diagnosis not present

## 2018-06-04 DIAGNOSIS — Z5189 Encounter for other specified aftercare: Secondary | ICD-10-CM | POA: Diagnosis not present

## 2018-06-04 DIAGNOSIS — F419 Anxiety disorder, unspecified: Secondary | ICD-10-CM | POA: Diagnosis not present

## 2018-06-04 MED ORDER — PEGFILGRASTIM-CBQV 6 MG/0.6ML ~~LOC~~ SOSY
6.0000 mg | PREFILLED_SYRINGE | Freq: Once | SUBCUTANEOUS | Status: AC
  Administered 2018-06-04: 6 mg via SUBCUTANEOUS

## 2018-06-04 MED ORDER — PEGFILGRASTIM-CBQV 6 MG/0.6ML ~~LOC~~ SOSY
PREFILLED_SYRINGE | SUBCUTANEOUS | Status: AC
  Filled 2018-06-04: qty 0.6

## 2018-06-09 ENCOUNTER — Other Ambulatory Visit: Payer: Self-pay | Admitting: Hematology

## 2018-06-14 ENCOUNTER — Telehealth: Payer: Self-pay

## 2018-06-14 ENCOUNTER — Other Ambulatory Visit: Payer: Self-pay | Admitting: Nurse Practitioner

## 2018-06-14 DIAGNOSIS — Z170 Estrogen receptor positive status [ER+]: Secondary | ICD-10-CM

## 2018-06-14 DIAGNOSIS — C50211 Malignant neoplasm of upper-inner quadrant of right female breast: Secondary | ICD-10-CM

## 2018-06-14 MED ORDER — HYDROCODONE-ACETAMINOPHEN 5-325 MG PO TABS: 1.0000 | ORAL_TABLET | Freq: Four times a day (QID) | ORAL | 0 refills | Status: DC | PRN

## 2018-06-14 NOTE — Telephone Encounter (Signed)
Patient calls requesting refill be sent in for Hydrocodone.  Tramadol is not helping.

## 2018-06-21 ENCOUNTER — Inpatient Hospital Stay (HOSPITAL_BASED_OUTPATIENT_CLINIC_OR_DEPARTMENT_OTHER): Payer: BLUE CROSS/BLUE SHIELD | Admitting: Nurse Practitioner

## 2018-06-21 ENCOUNTER — Telehealth: Payer: Self-pay

## 2018-06-21 ENCOUNTER — Encounter: Payer: Self-pay | Admitting: Nurse Practitioner

## 2018-06-21 VITALS — BP 128/61 | HR 92 | Temp 98.1°F | Resp 17 | Ht 64.0 in | Wt 166.4 lb

## 2018-06-21 DIAGNOSIS — R609 Edema, unspecified: Secondary | ICD-10-CM | POA: Diagnosis not present

## 2018-06-21 DIAGNOSIS — R21 Rash and other nonspecific skin eruption: Secondary | ICD-10-CM

## 2018-06-21 DIAGNOSIS — F419 Anxiety disorder, unspecified: Secondary | ICD-10-CM

## 2018-06-21 DIAGNOSIS — Z5111 Encounter for antineoplastic chemotherapy: Secondary | ICD-10-CM | POA: Diagnosis not present

## 2018-06-21 DIAGNOSIS — Z170 Estrogen receptor positive status [ER+]: Secondary | ICD-10-CM

## 2018-06-21 DIAGNOSIS — C50211 Malignant neoplasm of upper-inner quadrant of right female breast: Secondary | ICD-10-CM | POA: Diagnosis not present

## 2018-06-21 DIAGNOSIS — Z5112 Encounter for antineoplastic immunotherapy: Secondary | ICD-10-CM | POA: Diagnosis not present

## 2018-06-21 DIAGNOSIS — L609 Nail disorder, unspecified: Secondary | ICD-10-CM | POA: Diagnosis not present

## 2018-06-21 DIAGNOSIS — R253 Fasciculation: Secondary | ICD-10-CM | POA: Diagnosis not present

## 2018-06-21 DIAGNOSIS — R110 Nausea: Secondary | ICD-10-CM

## 2018-06-21 DIAGNOSIS — R918 Other nonspecific abnormal finding of lung field: Secondary | ICD-10-CM | POA: Diagnosis not present

## 2018-06-21 DIAGNOSIS — K30 Functional dyspepsia: Secondary | ICD-10-CM

## 2018-06-21 DIAGNOSIS — I10 Essential (primary) hypertension: Secondary | ICD-10-CM

## 2018-06-21 DIAGNOSIS — M79605 Pain in left leg: Secondary | ICD-10-CM | POA: Diagnosis not present

## 2018-06-21 DIAGNOSIS — Z5189 Encounter for other specified aftercare: Secondary | ICD-10-CM | POA: Diagnosis not present

## 2018-06-21 DIAGNOSIS — M79604 Pain in right leg: Secondary | ICD-10-CM

## 2018-06-21 MED ORDER — CYCLOBENZAPRINE HCL 5 MG PO TABS: 5.0000 mg | ORAL_TABLET | Freq: Three times a day (TID) | ORAL | 0 refills | Status: DC | PRN

## 2018-06-21 MED ORDER — CEPHALEXIN 500 MG PO CAPS: 500.0000 mg | ORAL_CAPSULE | Freq: Three times a day (TID) | ORAL | 0 refills | Status: DC

## 2018-06-21 NOTE — Telephone Encounter (Signed)
Patient called after hours nurse with c/o fingernails had already been turning black, now middle finger started oozing pus on Saturday, has tried soaking her hand, using peroxide, neosporin ointment, still oozing, patient will come in today for 1:30 appointment with Cira Rue NP. High priority scheduling message was sent.

## 2018-06-21 NOTE — Progress Notes (Signed)
Claire Guzman  [phone] [phone]  Clinic Follow up Note   Patient Care Team: Lujean Amel, MD as PCP - General (Family Medicine) Fanny Skates, MD as Consulting Physician (General Surgery) Truitt Merle, MD as Consulting Physician (Hematology) Eppie Gibson, MD as Attending Physician (Radiation Oncology) 06/21/2018  SUMMARY OF ONCOLOGIC HISTORY: Oncology History   Cancer Staging Malignant neoplasm of upper-inner quadrant of right breast in female, estrogen receptor positive ([place]) Staging form: Breast, AJCC 8th Edition - Clinical stage from 03/08/2018: Stage IB (cT2, cN1, cM0, G3, ER+, PR+, HER2+) - Signed by Truitt Merle, MD on 03/16/2018       Malignant neoplasm of upper-inner quadrant of right breast in female, estrogen receptor positive ([place])   02/24/2018 Mammogram    screening mammogram on 02/24/2018 that was benign    03/04/2018 Mammogram    diagnostic mammogram on 03/04/2018 due to a palpable mass on her right breast. Diagnostic mammogram, breast US revealed and biopsy revealed suspicious mass in the right breast at 2:30 at the palpable site of concern and one suspicious right axillary LN.    03/08/2018 Cancer Staging    Staging form: Breast, AJCC 8th Edition - Clinical stage from 03/08/2018: Stage IB (cT2, cN1, cM0, G3, ER+, PR+, HER2+) - Signed by Truitt Merle, MD on 03/16/2018    03/08/2018 Receptors her2    Her2 positive, ER 90% PR 5% and Ki67 60%    03/08/2018 Pathology Results    Diagnosis 1. Breast, right, needle core biopsy, 2:30 - INVASIVE DUCTAL CARCINOMA, GRADE 3. - LYMPHOVASCULAR SPACE INVOLVEMENT BY TUMOR. 2. Lymph node, needle/core biopsy, right axilla - METASTATIC CARCINOMA INVOLVING SCANT LYMPHOID TISSUE.    03/10/2018 Initial Diagnosis    Malignant neoplasm of upper-inner quadrant of right breast in female, estrogen receptor positive ([place])    03/20/2018 Imaging    MRI brain 03/20/18 IMPRESSION: No evidence of metastatic  disease.  Normal appearance of brain. Slightly heterogeneous marrow pattern of the upper cervical spine, nonspecific but likely benign, especially in the setting of early stage disease.     03/24/2018 Echocardiogram    Baseline ECHO 03/24/18 Study Conclusions - Left ventricle: The cavity size was normal. Wall thickness was   increased in a pattern of mild LVH. Systolic function was normal.   The estimated ejection fraction was in the range of 60% to 65%.   Wall motion was normal; there were no regional wall motion   abnormalities. Doppler parameters are consistent with abnormal   left ventricular relaxation (grade 1 diastolic dysfunction). - Right ventricle: The cavity size was mildly dilated. Wall   thickness was normal.    03/25/2018 Imaging    MRI Breast B/l 03/25/18 IMPRESSION: 1. The patient's primary malignancy in the posterior right breast measures 3.2 x 2 x 2.6 cm with apparent invasion of the underlying pectoralis muscle. Numerous surrounding abnormal enhancing masses, consistent with satellite lesions, extend from 11 o'clock in the right breast into the inferolateral right breast with a total span of suspected disease measuring 4.5 x 4.6 x 6.4 cm in AP, transverse, and craniocaudal dimension. The suspected extent of disease involves the superior and inferolateral quadrants. The primary malignancy also extends just across midline into the superior medial quadrant. 2. No MRI evidence of malignancy in the left breast. 3. Known metastatic noted in the right axilla.    03/25/2018 Imaging    Whole body bone scan 03/25/18 IMPRESSION: No scintigraphic evidence skeletal metastasis.    03/25/2018 Imaging    CT  CAP W Contrast 03/25/18 IMPRESSION: 1. Mass within the deep aspect of the right breast along the pectoralis muscle compatible with primary breast malignancy. 2. There is suggestion of two additional possible masses within the lateral aspect of the right breast versus nodular  appearing breast tissue. Recommend dedicated evaluation with bilateral breast MRI. 3. Mildly thickened right axillary lymph node compatible with metastatic adenopathy, recently biopsied. 4. Multiple bilateral pulmonary nodules are indeterminate, potentially sequelae of prior infectious/inflammatory process. Recommend attention on follow-up. Consider follow-up CT in 6 months. 5. Portal venous gas is demonstrated within the left hepatic lobe. Additionally, there is wall thickening of the cecum and ascending colon with small amount of gas within the pericolonic vasculature. Constellation of findings is indeterminate in etiology however may be secondary to colitis at this location with considerations including infectious, inflammatory or ischemic etiologies. Correlate for recent procedure. If patient is not up-to-date for colonic screening, recommend further evaluation with colonoscopy to exclude the possibility of colonic mass within the cecum/ascending colon. 6. These results were called by telephone at the time of interpretation on 03/25/2018 at 9:31 am to Dr. Truitt Merle , who verbally acknowledged these results.    03/30/2018 -  Chemotherapy    Neoadjuvant TCHP every 3 weeks starting 03/30/18     Genetic Testing    Negative genetic testing on the [facility] Common Hereditary Cancers Panel. The Common Hereditary Cancers Panel offered by [facility] includes sequencing and/or deletion duplication testing of the following 47 genes: APC, ATM, AXIN2, BARD1, BMPR1A, BRCA1, BRCA2, BRIP1, CDH1, CDKN2A (p14ARF), CDKN2A (p16INK4a), CKD4, CHEK2, CTNNA1, DICER1, EPCAM (Deletion/duplication testing only), GREM1 (promoter region deletion/duplication testing only), KIT, MEN1, MLH1, MSH2, MSH3, MSH6, MUTYH, NBN, NF1, NHTL1, PALB2, PDGFRA, PMS2, POLD1, POLE, PTEN, RAD50, RAD51C, RAD51D, SDHB, SDHC, SDHD, SMAD4, SMARCA4. STK11, TP53, TSC1, TSC2, and VHL.  The following genes were evaluated for sequence changes only: SDHA  and HOXB13 c.251G>A variant only.   Genetic testing did detect a Variant of Unknown Significance (VUS) in the POLD1 gene called c.985C>T (p.Pro329Ser). At this time, it is unknown if this variant is associated with increased cancer risk or if this is a normal finding, but most variants such as this get reclassified to being inconsequential. It should not be used to make medical management decisions.   The report date is 04/09/2018.   CURRENT THERAPY: Neoadjuvant TCHP every 3 weeks starting 03/30/18, plan for 6 cycles, carbo dose reduction from cycle 2 due to poor tolerance   INTERVAL HISTORY: Claire Guzman presents for symptom management visit. She completed cycle 4 neoadjuvant TCHP on 11/6 with Udenyca on 11/8. She notes this cycle was worse in terms of side effects. She has had nail darkening and tenderness progressing since last cycle, recently 5 days ago she developed redness, tenderness, and pus draining from right 2nd finger. She has tried topical antibiotic ointment, water and peroxide soaks. The pain and sensitivity make it difficult to perform tasks, do laundry, grocery shop, get dressed etc. She denies neuropathy. She continues to note eye twitching, she took baclofen at night for 3 nights and did not improve; she finds this very aggravating. She has frequent nausea and indigestion, nearly daily in the afternoon. She takes compazine 1-2 tabs PRN; she hesitates to take zofran due to fear of constipation, which she has for a few days after chemo. She alternates with diarrhea, but resolves after 1 tab of imodium. She has moderate body pain, especially in her legs. She rarely takes norco or tramadol due to  side effects, she is able to do most activities and remains active. Pain is exacerbated by Udenyca. She was fired from her job last week before her family went on vacation. She is stressed about finances.    MEDICAL HISTORY:  Past Medical History:  Diagnosis Date  . Abdominal pain, unspecified site    . Anxiety state, unspecified   . Cancer [facility])    R breast cancer  . Contact dermatitis and other eczema, due to unspecified cause   . Essential hypertension, benign   . Family history of colon cancer   . Family history of melanoma   . Family history of prostate cancer   . GERD (gastroesophageal reflux disease)   . Hematuria, unspecified   . Hypertension   . Lower back pain   . Mixed hyperlipidemia   . Rosacea   . Unspecified symptom associated with female genital organs     SURGICAL HISTORY: Past Surgical History:  Procedure Laterality Date  . CESAREAN SECTION    . DIAGNOSTIC LAPAROSCOPY    . LAPAROSCOPIC APPENDECTOMY    . PORTACATH PLACEMENT Left 04/13/2018   Procedure: INSERTION PORT-A-CATH WITH [place];  Surgeon: Fanny Skates, MD;  Location: [facility];  Service: General;  Laterality: Left;  . UTERINE FIBROID SURGERY      I have reviewed the social history and family history with the patient and they are unchanged from previous note.  ALLERGIES:  is allergic to metrogel [metronidazole]; other; paxil [paroxetine hcl]; prednisone; tramadol; and zomig [zolmitriptan].  MEDICATIONS:  Current Outpatient Medications  Medication Sig Dispense Refill  . ALPRAZolam (XANAX) 0.5 MG tablet Take 1 tablet (0.5 mg total) by mouth at bedtime as needed for anxiety. 30 tablet 0  . atenolol (TENORMIN) 50 MG tablet Take 50 mg by mouth at bedtime.    Marland Kitchen omeprazole (PRILOSEC) 20 MG capsule Take 1 capsule (20 mg total) by mouth 2 (two) times daily before a meal. 60 capsule 2  . potassium chloride SA (K-DUR,KLOR-CON) 20 MEQ tablet TAKE 1 TABLET(20 MEQ) BY MOUTH TWICE DAILY 30 tablet 0  . prochlorperazine (COMPAZINE) 10 MG tablet TAKE 1 TABLET BY MOUTH EVERY 6 HOURS AS NEEDED FOR NAUSEA OR VOMITING 30 tablet 0  . traMADol (ULTRAM) 50 MG tablet Take 1 tablet (50 mg total) by mouth every 12 (twelve) hours as needed. 30 tablet 0  . venlafaxine (EFFEXOR) 37.5 MG tablet Take 37.5 mg by  mouth 2 (two) times daily.    . Baclofen 5 MG TABS TAKE 1 TO 2 TABLETS BY MOUTH EVERY 8 HOURS AS NEEDED FOR EYE LID SPASMS 30 tablet 0  . cephALEXin (KEFLEX) 500 MG capsule Take 1 capsule (500 mg total) by mouth 3 (three) times daily. 21 capsule 0  . cyclobenzaprine (FLEXERIL) 5 MG tablet Take 1 tablet (5 mg total) by mouth 3 (three) times daily as needed for muscle spasms. 30 tablet 0  . dexamethasone (DECADRON) 4 MG tablet Take 1 tablet (4 mg total) by mouth 2 (two) times daily. Start the day before Taxotere. Take once the day after, then 2 times a day x 2d. 30 tablet 1  . doxycycline (VIBRA-TABS) 100 MG tablet Take 1 tablet (100 mg total) by mouth 2 (two) times daily. (Patient not taking: Reported on 06/21/2018) 60 tablet 0  . furosemide (LASIX) 20 MG tablet Take 1 tablet (20 mg total) by mouth daily. (Patient not taking: Reported on 06/21/2018) 20 tablet 1  . HYDROcodone-acetaminophen (NORCO) 5-325 MG tablet Take 1-2 tablets by  mouth every 6 (six) hours as needed for moderate pain or severe pain. 20 tablet 0  . lidocaine-prilocaine (EMLA) cream Apply to affected area once 30 g 3  . ondansetron (ZOFRAN) 8 MG tablet Take 1 tablet (8 mg total) by mouth 2 (two) times daily as needed (Nausea or vomiting). Begin 4 days after chemotherapy. (Patient not taking: Reported on 06/21/2018) 30 tablet 1  . triamcinolone ointment (KENALOG) 0.5 % Apply 1 application topically 2 (two) times daily. (Patient not taking: Reported on 06/21/2018) 30 g 1   No current facility-administered medications for this visit.     PHYSICAL EXAMINATION: ECOG PERFORMANCE STATUS: 1 - Symptomatic but completely ambulatory  Vitals:   06/21/18 1340  BP: 128/61  Pulse: 92  Resp: 17  Temp: 98.1 F (36.7 C)  SpO2: 98%   Filed Weights   06/21/18 1340  Weight: 166 lb 6.4 oz (75.5 kg)    GENERAL:alert, no distress and comfortable SKIN:  no rashes or significant lesions.  EYES:  sclera clear OROPHARYNX:no thrush or ulcers    LYMPH:  no palpable cervical or supraclavicular lymphadenopathy LUNGS: clear to auscultation with normal breathing effort HEART: regular rate & rhythm, no lower extremity edema ABDOMEN:abdomen soft, non-tender and normal bowel sounds Musculoskeletal: Hyperpigmentation to nailbeds. Mild erythema and edema to right hand 2nd finger with clear drainage. Generalized swelling to digits  NEURO: alert & oriented x 3 with fluent speech, no focal motor/sensory deficits per tuning fork exam PAC without erythema  Breast exam: no palpable mass in either breast or axilla that I could appreciate   LABORATORY DATA:  I have reviewed the data as listed CBC Latest Ref Rng & Units 06/02/2018 05/11/2018 05/03/2018  WBC 4.0 - 10.5 K/uL 4.3 6.3 13.3(H)  Hemoglobin 12.0 - 15.0 g/dL 11.1(L) 10.9(L) 11.2(L)  Hematocrit 36.0 - 46.0 % 32.6(L) 31.1(L) 31.9(L)  Platelets 150 - 400 K/uL 163 215 172     CMP Latest Ref Rng & Units 06/02/2018 05/11/2018 05/03/2018  Glucose 70 - 99 mg/dL 135(H) 115(H) 90  BUN 6 - 20 mg/dL 9 9 8   Creatinine 0.44 - 1.00 mg/dL 0.66 0.62 0.64  Sodium 135 - 145 mmol/L 142 142 140  Potassium 3.5 - 5.1 mmol/L 3.4(L) 3.5 3.7  Chloride 98 - 111 mmol/L 106 106 105  CO2 22 - 32 mmol/L 27 28 27   Calcium 8.9 - 10.3 mg/dL 8.9 9.2 9.1  Total Protein 6.5 - 8.1 g/dL 6.5 6.4(L) 6.8  Total Bilirubin 0.3 - 1.2 mg/dL 0.4 0.4 0.4  Alkaline Phos 38 - 126 U/L 95 111 138(H)  AST 15 - 41 U/L 22 21 23   ALT 0 - 44 U/L 17 15 15    PATHOLOGY:   03/17/2018 Molecular Pathology   03/08/2018 Surgical Pathology ADDITIONAL INFORMATION: 1. PROGNOSTIC INDICATORS Results: IMMUNOHISTOCHEMICAL AND MORPHOMETRIC ANALYSIS PERFORMED MANUALLY The tumor cells are positive for Her2 (3+). Estrogen Receptor: 90%, POSITIVE, STRONG STAINING INTENSITY Progesterone Receptor: 5%, POSITIVE, STRONG STAINING INTENSITY Proliferation Marker Ki67: 60% REFERENCE RANGE ESTROGEN RECEPTOR NEGATIVE 0% POSITIVE =>1% REFERENCE RANGE  PROGESTERONE RECEPTOR NEGATIVE 0% POSITIVE =>1% All controls stained appropriately Diagnosis 1. Breast, right, needle core biopsy, 2:30 - INVASIVE DUCTAL CARCINOMA, GRADE 3. - LYMPHOVASCULAR SPACE INVOLVEMENT BY TUMOR. 2. Lymph node, needle/core biopsy, right axilla - METASTATIC CARCINOMA INVOLVING SCANT LYMPHOID TISSUE. Synoptic Report 1. Breast prognostic profile will be performed. Dr. Vic Ripper agrees. Called to [facility] on 03/09/18. (JDP:gt, 03/09/18) Specimen Comment 1. Formalin 1:30 PM; right breast mass 2. Axillary  adenopathy Specimen(s) Obtained: 1. Breast, right, needle core biopsy, 2:30 2. Lymph node, needle/core biopsy, right axilla Specimen Clinical Information 1. R/O IMC 2. R/O axillary metastasis Gross 1. Received in formalin (time in formalin 1:30 p.m., cold ischemic time unknown) are two cores of soft tan yellow tissue measuring 1.3 and 1.8 cm in length and each 0.2 cm in diameter. The specimen is submitted in toto. 2. Received in formalin (time in formalin 1:30 p.m., cold ischemic time unknown) is a 1.3 x 0.2 cm core of soft hemorrhagic tissue and a separate 0.2 cm soft tan yellow tissue fragment. The specimen is submitted in toto. (GP:gt, 03/09/18) Stain(s) used in Diagnosis: The following stain(s) were used in diagnosing the case: ER-ACIS, PR-ACIS, Her2 by IHC, KI-67-ACIS. The control(s) stained appropriately.    RADIOGRAPHIC STUDIES: I have personally reviewed the radiological images as listed and agreed with the findings in the report. No results found.   ASSESSMENT & PLAN: Claire Guzman is a 52 y.o. lovely female with a history of   1.Malignant neoplasm of upper-inner quadrant of right breast in female, invasive ductal carcinoma, cT2N1M0 stage IB, ER+/PR+/HER2+, G3  -She began neoadjuvant TCHP. Due to poor tolerance and side effects carboplatin was reduced to AUC 5 from cycle 2. She tolerates treatment moderately well, with  body pain, eye twitching, mild edema, nausea, and nail toxicity.  -we reviewed symptom management; I recommend to increase anti-emetics PRN and add zofran as needed. See below for additional recommendations  -she has recovered fairly well.  -Her breast mass is not palpable, she has clinically responded to chemotherapy  -she will return in 2 days for lab and cycle 5 TCHP; I reviewed toxicities with Dr. Burr Medico, who may recommend dose reduction -f/u in 3 weeks with cycle 6  2.Genetics  -She has no family history of breast cancer, due to her relatively young age of breast cancer, she is a candidate for genetic testing. She expressed interest -Genetic testing performed through[facility]'s Common Hereditary Cancers Panelreported out on 9/16/2019showed no pathogenic mutations.The Common Hereditary Cancers Panel offered by [facility] includes sequencing and/or deletion duplication testing of the following 47 genes: APC, ATM, AXIN2, BARD1, BMPR1A, BRCA1, BRCA2, BRIP1, CDH1, CDKN2A (p14ARF), CDKN2A (p16INK4a), CKD4, CHEK2, CTNNA1, DICER1, EPCAM (Deletion/duplication testing only), GREM1 (promoter region deletion/duplication testing only), KIT, MEN1, MLH1, MSH2, MSH3, MSH6, MUTYH, NBN, NF1, NHTL1, PALB2, PDGFRA, PMS2, POLD1, POLE, PTEN, RAD50, RAD51C, RAD51D, SDHB, SDHC, SDHD, SMAD4, SMARCA4. STK11, TP53, TSC1, TSC2, and VHL. The following genes were evaluated for sequence changes only: SDHA and HOXB13 c.251G>A variant only. A variant of uncertain significance (VUS) in a gene calledPOLD1was also noted.   3. Anxiety  -She has history of anxiety, currently on Xanax as needed  4. HTN -She will continue atenolol. We we will monitor her blood pressure closely during the chemotherapy, and adjust her medication as needed. -BP in good range lately   5. Skin rash -Red rash on her back, upper chest, face and scalp, likely related to Herceptin and pejeta -She denies being in the sun -She tried over-the-counter  hydrocortisone cream, did not help much, She took doxycycline, which cleared the rash. -Her rash has much improved after cycle 2, she has mild skin edema and peeling on her hands -edema and peeling are stable   6. Body pain -Secondary to chemotherapy and Neulasta -She has tramadol and norco as needed for severe pain; she does not take often due to side effects -pain is moderate, but tolerable; she is able to  remain active   7.  Bilateral small lung nodules -Her restaging CT scan showed bilateral multiple small lung nodules, 3 to 4 mm, indeterminate, likely benign.  She is a never smoker -Plan to repeat CT chest without contrast in the future  8. Hand and leg edema -Mild, and symmetric, likely related to steroids and chemo, unlikely DVT. -She tried Lasix 20 mg daily to twice daily, did not help her much; she does not take anymore   9. Eye lid twitching -Likely related to chemotherapy, she tried baclofen, which does not help much -I prescribed flexeril 5 mg TID PRN for her to try, I recommend first dose at night at home, as it may cause drowsiness   10. Nail toxicity  -she tried water and peroxide soaks with topical antimicrobial ointment -she developed redness, swelling, and drainage from right hand 2nd finger after cycle 4 -I encouraged her to continue topical therapy. I prescribed keflex TID x1 week -I recommend cryotherapy to nails during chemo if she can tolerate   11. Social/financial needs -she was recently fired from her job, she is stressed about finances. I referred her to SW today to discuss financial options   Plan: -Return for lab and cycle 5 TCHP 11/27 -For nail toxicity: continue soaks and topical therapy, begin keflex 1 tab TID x1 week (Rx sent) -For eye twitching: stop baclofen, try flexeril PRN (Rx sent) -For nausea: continue compazine PRN, add zofran PRN and alternate  -Referral to SW -F/u in 3 weeks with cycle 6    Orders Placed This Encounter    Procedures  . Ambulatory referral to Social Work    Referral Priority:   Routine    Referral Type:   Consultation    Referral Reason:   Specialty Services Required    Number of Visits Requested:   1   All questions were answered. The patient knows to call the clinic with any problems, questions or concerns. No barriers to learning was detected. I spent 20 minutes counseling the patient face to face. The total time spent in the appointment was 25 minutes and more than 50% was on counseling and review of test results     Alla Feeling, NP 06/21/18

## 2018-06-22 ENCOUNTER — Telehealth: Payer: Self-pay

## 2018-06-22 NOTE — Telephone Encounter (Signed)
Spoke with patient concerning canceling her appointments with Dolores Hoose 11/27 and keeping the rest of her schedule appointment. Per 11/25 los

## 2018-06-23 ENCOUNTER — Ambulatory Visit: Payer: BLUE CROSS/BLUE SHIELD | Admitting: Hematology

## 2018-06-23 ENCOUNTER — Encounter: Payer: Self-pay | Admitting: Hematology

## 2018-06-23 ENCOUNTER — Inpatient Hospital Stay: Payer: BLUE CROSS/BLUE SHIELD

## 2018-06-23 VITALS — BP 124/62 | HR 83 | Temp 98.4°F | Resp 16

## 2018-06-23 DIAGNOSIS — R110 Nausea: Secondary | ICD-10-CM | POA: Diagnosis not present

## 2018-06-23 DIAGNOSIS — C50211 Malignant neoplasm of upper-inner quadrant of right female breast: Secondary | ICD-10-CM

## 2018-06-23 DIAGNOSIS — L609 Nail disorder, unspecified: Secondary | ICD-10-CM | POA: Diagnosis not present

## 2018-06-23 DIAGNOSIS — Z170 Estrogen receptor positive status [ER+]: Secondary | ICD-10-CM

## 2018-06-23 DIAGNOSIS — K30 Functional dyspepsia: Secondary | ICD-10-CM | POA: Diagnosis not present

## 2018-06-23 DIAGNOSIS — M79605 Pain in left leg: Secondary | ICD-10-CM | POA: Diagnosis not present

## 2018-06-23 DIAGNOSIS — Z5111 Encounter for antineoplastic chemotherapy: Secondary | ICD-10-CM | POA: Diagnosis not present

## 2018-06-23 DIAGNOSIS — Z5189 Encounter for other specified aftercare: Secondary | ICD-10-CM | POA: Diagnosis not present

## 2018-06-23 DIAGNOSIS — R253 Fasciculation: Secondary | ICD-10-CM | POA: Diagnosis not present

## 2018-06-23 DIAGNOSIS — R21 Rash and other nonspecific skin eruption: Secondary | ICD-10-CM | POA: Diagnosis not present

## 2018-06-23 DIAGNOSIS — F419 Anxiety disorder, unspecified: Secondary | ICD-10-CM | POA: Diagnosis not present

## 2018-06-23 DIAGNOSIS — Z95828 Presence of other vascular implants and grafts: Secondary | ICD-10-CM

## 2018-06-23 DIAGNOSIS — R918 Other nonspecific abnormal finding of lung field: Secondary | ICD-10-CM | POA: Diagnosis not present

## 2018-06-23 DIAGNOSIS — R609 Edema, unspecified: Secondary | ICD-10-CM | POA: Diagnosis not present

## 2018-06-23 DIAGNOSIS — I10 Essential (primary) hypertension: Secondary | ICD-10-CM | POA: Diagnosis not present

## 2018-06-23 DIAGNOSIS — Z5112 Encounter for antineoplastic immunotherapy: Secondary | ICD-10-CM | POA: Diagnosis not present

## 2018-06-23 DIAGNOSIS — M79604 Pain in right leg: Secondary | ICD-10-CM | POA: Diagnosis not present

## 2018-06-23 LAB — CBC WITH DIFFERENTIAL (CANCER CENTER ONLY)
Abs Immature Granulocytes: 0.01 K/uL (ref 0.00–0.07)
Basophils Absolute: 0 K/uL (ref 0.0–0.1)
Basophils Relative: 1 %
Eosinophils Absolute: 0 K/uL (ref 0.0–0.5)
Eosinophils Relative: 0 %
HCT: 30.6 % — ABNORMAL LOW (ref 36.0–46.0)
Hemoglobin: 10.3 g/dL — ABNORMAL LOW (ref 12.0–15.0)
Immature Granulocytes: 0 %
Lymphocytes Relative: 27 %
Lymphs Abs: 1.2 K/uL (ref 0.7–4.0)
MCH: 34.4 pg — ABNORMAL HIGH (ref 26.0–34.0)
MCHC: 33.7 g/dL (ref 30.0–36.0)
MCV: 102.3 fL — ABNORMAL HIGH (ref 80.0–100.0)
Monocytes Absolute: 0.4 K/uL (ref 0.1–1.0)
Monocytes Relative: 10 %
Neutro Abs: 2.7 K/uL (ref 1.7–7.7)
Neutrophils Relative %: 62 %
Platelet Count: 172 K/uL (ref 150–400)
RBC: 2.99 MIL/uL — ABNORMAL LOW (ref 3.87–5.11)
RDW: 16.1 % — ABNORMAL HIGH (ref 11.5–15.5)
WBC Count: 4.4 K/uL (ref 4.0–10.5)
nRBC: 0 % (ref 0.0–0.2)

## 2018-06-23 LAB — CMP (CANCER CENTER ONLY)
ALT: 12 U/L (ref 0–44)
AST: 19 U/L (ref 15–41)
Albumin: 3.3 g/dL — ABNORMAL LOW (ref 3.5–5.0)
Alkaline Phosphatase: 90 U/L (ref 38–126)
Anion gap: 10 (ref 5–15)
BUN: 10 mg/dL (ref 6–20)
CO2: 26 mmol/L (ref 22–32)
Calcium: 8.9 mg/dL (ref 8.9–10.3)
Chloride: 107 mmol/L (ref 98–111)
Creatinine: 0.61 mg/dL (ref 0.44–1.00)
GFR, Est AFR Am: >60 mL/min (ref >60)
GFR, Estimated: >60 mL/min (ref >60)
Glucose, Bld: 108 mg/dL — ABNORMAL HIGH (ref 70–99)
Potassium: 3.3 mmol/L — ABNORMAL LOW (ref 3.5–5.1)
Sodium: 143 mmol/L (ref 135–145)
Total Bilirubin: 0.4 mg/dL (ref 0.3–1.2)
Total Protein: 5.9 g/dL — ABNORMAL LOW (ref 6.5–8.1)

## 2018-06-23 MED ORDER — ACETAMINOPHEN 325 MG PO TABS
ORAL_TABLET | ORAL | Status: AC
  Filled 2018-06-23: qty 2

## 2018-06-23 MED ORDER — SODIUM CHLORIDE 0.9 % IV SOLN
60.0000 mg/m2 | Freq: Once | INTRAVENOUS | Status: AC
  Administered 2018-06-23: 110 mg via INTRAVENOUS
  Filled 2018-06-23: qty 11

## 2018-06-23 MED ORDER — DIPHENHYDRAMINE HCL 25 MG PO CAPS
50.0000 mg | ORAL_CAPSULE | Freq: Once | ORAL | Status: AC
  Administered 2018-06-23: 50 mg via ORAL

## 2018-06-23 MED ORDER — SODIUM CHLORIDE 0.9% FLUSH
10.0000 mL | INTRAVENOUS | Status: DC | PRN
  Administered 2018-06-23: 10 mL
  Filled 2018-06-23: qty 10

## 2018-06-23 MED ORDER — SODIUM CHLORIDE 0.9 % IV SOLN
Freq: Once | INTRAVENOUS | Status: AC
  Administered 2018-06-23: 11:00:00 via INTRAVENOUS
  Filled 2018-06-23: qty 5

## 2018-06-23 MED ORDER — TRASTUZUMAB CHEMO 150 MG IV SOLR
450.0000 mg | Freq: Once | INTRAVENOUS | Status: AC
  Administered 2018-06-23: 450 mg via INTRAVENOUS
  Filled 2018-06-23: qty 21.43

## 2018-06-23 MED ORDER — ACETAMINOPHEN 325 MG PO TABS
650.0000 mg | ORAL_TABLET | Freq: Once | ORAL | Status: AC
  Administered 2018-06-23: 650 mg via ORAL

## 2018-06-23 MED ORDER — SODIUM CHLORIDE 0.9 % IV SOLN
Freq: Once | INTRAVENOUS | Status: AC
  Administered 2018-06-23: 11:00:00 via INTRAVENOUS
  Filled 2018-06-23: qty 250

## 2018-06-23 MED ORDER — PALONOSETRON HCL INJECTION 0.25 MG/5ML
INTRAVENOUS | Status: AC
  Filled 2018-06-23: qty 5

## 2018-06-23 MED ORDER — PALONOSETRON HCL INJECTION 0.25 MG/5ML
0.2500 mg | Freq: Once | INTRAVENOUS | Status: AC
  Administered 2018-06-23: 0.25 mg via INTRAVENOUS

## 2018-06-23 MED ORDER — SODIUM CHLORIDE 0.9 % IV SOLN
420.0000 mg | Freq: Once | INTRAVENOUS | Status: AC
  Administered 2018-06-23: 420 mg via INTRAVENOUS
  Filled 2018-06-23: qty 14

## 2018-06-23 MED ORDER — SODIUM CHLORIDE 0.9 % IV SOLN
550.0000 mg | Freq: Once | INTRAVENOUS | Status: AC
  Administered 2018-06-23: 550 mg via INTRAVENOUS
  Filled 2018-06-23: qty 55

## 2018-06-23 MED ORDER — HEPARIN SOD (PORK) LOCK FLUSH 100 UNIT/ML IV SOLN
500.0000 [IU] | Freq: Once | INTRAVENOUS | Status: AC | PRN
  Administered 2018-06-23: 500 [IU]
  Filled 2018-06-23: qty 5

## 2018-06-23 MED ORDER — DIPHENHYDRAMINE HCL 25 MG PO CAPS
ORAL_CAPSULE | ORAL | Status: AC
  Filled 2018-06-23: qty 2

## 2018-06-23 NOTE — Progress Notes (Signed)
Received call from RN regarding patient with financial concerns.  Went to infusion to introduce myself as Arboriculturist and to provide my card to contact me on Mon 06/28/18 to discuss what may be available and make appointment for what to bring if eligible to apply. She verbalized understanding.

## 2018-06-23 NOTE — Patient Instructions (Signed)
[facility] Discharge Instructions for Patients Receiving Chemotherapy  Today you received the following chemotherapy agents Taxol, Carbo, Herceptin, Perjeta  To help prevent nausea and vomiting after your treatment, we encourage you to take your nausea medication as prescribed.   If you develop nausea and vomiting that is not controlled by your nausea medication, call the clinic.   BELOW ARE SYMPTOMS THAT SHOULD BE REPORTED IMMEDIATELY:  *FEVER GREATER THAN 100.5 F  *CHILLS WITH OR WITHOUT FEVER  NAUSEA AND VOMITING THAT IS NOT CONTROLLED WITH YOUR NAUSEA MEDICATION  *UNUSUAL SHORTNESS OF BREATH  *UNUSUAL BRUISING OR BLEEDING  TENDERNESS IN MOUTH AND THROAT WITH OR WITHOUT PRESENCE OF ULCERS  *URINARY PROBLEMS  *BOWEL PROBLEMS  UNUSUAL RASH Items with * indicate a potential emergency and should be followed up as soon as possible.  Feel free to call the clinic should you have any questions or concerns. The clinic phone number is [phone].  Please show the [facility] at check-in to the Emergency Department and triage nurse.

## 2018-06-23 NOTE — Addendum Note (Signed)
Addended by: Truitt Merle on: 06/23/2018 10:52 AM   Modules accepted: Orders

## 2018-06-24 LAB — CANCER ANTIGEN 27.29: CAN 27.29: 42.3 U/mL — AB (ref 0.0–38.6)

## 2018-06-25 ENCOUNTER — Telehealth: Payer: Self-pay | Admitting: Nurse Practitioner

## 2018-06-25 ENCOUNTER — Inpatient Hospital Stay: Payer: BLUE CROSS/BLUE SHIELD

## 2018-06-25 DIAGNOSIS — C50211 Malignant neoplasm of upper-inner quadrant of right female breast: Secondary | ICD-10-CM

## 2018-06-25 DIAGNOSIS — L609 Nail disorder, unspecified: Secondary | ICD-10-CM | POA: Diagnosis not present

## 2018-06-25 DIAGNOSIS — M79604 Pain in right leg: Secondary | ICD-10-CM | POA: Diagnosis not present

## 2018-06-25 DIAGNOSIS — Z5111 Encounter for antineoplastic chemotherapy: Secondary | ICD-10-CM | POA: Diagnosis not present

## 2018-06-25 DIAGNOSIS — R609 Edema, unspecified: Secondary | ICD-10-CM | POA: Diagnosis not present

## 2018-06-25 DIAGNOSIS — Z5189 Encounter for other specified aftercare: Secondary | ICD-10-CM | POA: Diagnosis not present

## 2018-06-25 DIAGNOSIS — Z170 Estrogen receptor positive status [ER+]: Secondary | ICD-10-CM

## 2018-06-25 DIAGNOSIS — R918 Other nonspecific abnormal finding of lung field: Secondary | ICD-10-CM | POA: Diagnosis not present

## 2018-06-25 DIAGNOSIS — Z5112 Encounter for antineoplastic immunotherapy: Secondary | ICD-10-CM | POA: Diagnosis not present

## 2018-06-25 DIAGNOSIS — R21 Rash and other nonspecific skin eruption: Secondary | ICD-10-CM | POA: Diagnosis not present

## 2018-06-25 DIAGNOSIS — F419 Anxiety disorder, unspecified: Secondary | ICD-10-CM | POA: Diagnosis not present

## 2018-06-25 DIAGNOSIS — I10 Essential (primary) hypertension: Secondary | ICD-10-CM | POA: Diagnosis not present

## 2018-06-25 DIAGNOSIS — M79605 Pain in left leg: Secondary | ICD-10-CM | POA: Diagnosis not present

## 2018-06-25 DIAGNOSIS — R253 Fasciculation: Secondary | ICD-10-CM | POA: Diagnosis not present

## 2018-06-25 DIAGNOSIS — R110 Nausea: Secondary | ICD-10-CM | POA: Diagnosis not present

## 2018-06-25 DIAGNOSIS — K30 Functional dyspepsia: Secondary | ICD-10-CM | POA: Diagnosis not present

## 2018-06-25 MED ORDER — PEGFILGRASTIM-CBQV 6 MG/0.6ML ~~LOC~~ SOSY
PREFILLED_SYRINGE | SUBCUTANEOUS | Status: AC
  Filled 2018-06-25: qty 0.6

## 2018-06-25 MED ORDER — PEGFILGRASTIM-CBQV 6 MG/0.6ML ~~LOC~~ SOSY
6.0000 mg | PREFILLED_SYRINGE | Freq: Once | SUBCUTANEOUS | Status: AC
  Administered 2018-06-25: 6 mg via SUBCUTANEOUS

## 2018-06-25 NOTE — Telephone Encounter (Signed)
Called regarding 12/5

## 2018-06-25 NOTE — Patient Instructions (Signed)
Pegfilgrastim injection What is this medicine? PEGFILGRASTIM (PEG fil gra stim) is a long-acting granulocyte colony-stimulating factor that stimulates the growth of neutrophils, a type of white blood cell important in the body's fight against infection. It is used to reduce the incidence of fever and infection in patients with certain types of cancer who are receiving chemotherapy that affects the bone marrow, and to increase survival after being exposed to high doses of radiation. This medicine may be used for other purposes; ask your health care provider or pharmacist if you have questions. COMMON BRAND NAME(S): Neulasta What should I tell my health care provider before I take this medicine? They need to know if you have any of these conditions: -kidney disease -latex allergy -ongoing radiation therapy -sickle cell disease -skin reactions to acrylic adhesives (On-Body Injector only) -an unusual or allergic reaction to pegfilgrastim, filgrastim, other medicines, foods, dyes, or preservatives -pregnant or trying to get pregnant -breast-feeding How should I use this medicine? This medicine is for injection under the skin. If you get this medicine at home, you will be taught how to prepare and give the pre-filled syringe or how to use the On-body Injector. Refer to the patient Instructions for Use for detailed instructions. Use exactly as directed. Tell your healthcare provider immediately if you suspect that the On-body Injector may not have performed as intended or if you suspect the use of the On-body Injector resulted in a missed or partial dose. It is important that you put your used needles and syringes in a special sharps container. Do not put them in a trash can. If you do not have a sharps container, call your pharmacist or healthcare provider to get one. Talk to your pediatrician regarding the use of this medicine in children. While this drug may be prescribed for selected conditions,  precautions do apply. Overdosage: If you think you have taken too much of this medicine contact a poison control center or emergency room at once. NOTE: This medicine is only for you. Do not share this medicine with others. What if I miss a dose? It is important not to miss your dose. Call your doctor or health care professional if you miss your dose. If you miss a dose due to an On-body Injector failure or leakage, a new dose should be administered as soon as possible using a single prefilled syringe for manual use. What may interact with this medicine? Interactions have not been studied. Give your health care provider a list of all the medicines, herbs, non-prescription drugs, or dietary supplements you use. Also tell them if you smoke, drink alcohol, or use illegal drugs. Some items may interact with your medicine. This list may not describe all possible interactions. Give your health care provider a list of all the medicines, herbs, non-prescription drugs, or dietary supplements you use. Also tell them if you smoke, drink alcohol, or use illegal drugs. Some items may interact with your medicine. What should I watch for while using this medicine? You may need blood work done while you are taking this medicine. If you are going to need a MRI, CT scan, or other procedure, tell your doctor that you are using this medicine (On-Body Injector only). What side effects may I notice from receiving this medicine? Side effects that you should report to your doctor or health care professional as soon as possible: -allergic reactions like skin rash, itching or hives, swelling of the face, lips, or tongue -dizziness -fever -pain, redness, or irritation at site   where injected -pinpoint red spots on the skin -red or dark-brown urine -shortness of breath or breathing problems -stomach or side pain, or pain at the shoulder -swelling -tiredness -trouble passing urine or change in the amount of urine Side  effects that usually do not require medical attention (report to your doctor or health care professional if they continue or are bothersome): -bone pain -muscle pain This list may not describe all possible side effects. Call your doctor for medical advice about side effects. You may report side effects to FDA at 1-800-FDA-1088. Where should I keep my medicine? Keep out of the reach of children. Store pre-filled syringes in a refrigerator between 2 and 8 degrees C (36 and 46 degrees F). Do not freeze. Keep in carton to protect from light. Throw away this medicine if it is left out of the refrigerator for more than 48 hours. Throw away any unused medicine after the expiration date. NOTE: This sheet is a summary. It may not cover all possible information. If you have questions about this medicine, talk to your doctor, pharmacist, or health care provider.  2018 [employer] (2016-07-10 12:58:03)  

## 2018-06-30 ENCOUNTER — Telehealth: Payer: Self-pay | Admitting: Hematology

## 2018-06-30 ENCOUNTER — Telehealth: Payer: Self-pay

## 2018-06-30 NOTE — Telephone Encounter (Signed)
Patient called to cancel her appointments for 12/5, stating her nails are not infected. Has a lot going on.  Scheduling message was sent to cancel and she will keep her next already scheduled appointments.

## 2018-06-30 NOTE — Telephone Encounter (Signed)
Cancelled appt per 12/4 sch message- per patient no reschedule wanted at this time - wants to keep appt as is on 12/27

## 2018-07-01 ENCOUNTER — Other Ambulatory Visit: Payer: BLUE CROSS/BLUE SHIELD

## 2018-07-01 ENCOUNTER — Ambulatory Visit: Payer: BLUE CROSS/BLUE SHIELD | Admitting: Nurse Practitioner

## 2018-07-05 ENCOUNTER — Other Ambulatory Visit: Payer: Self-pay | Admitting: Nurse Practitioner

## 2018-07-05 DIAGNOSIS — C50211 Malignant neoplasm of upper-inner quadrant of right female breast: Secondary | ICD-10-CM

## 2018-07-05 DIAGNOSIS — Z170 Estrogen receptor positive status [ER+]: Secondary | ICD-10-CM

## 2018-07-09 ENCOUNTER — Other Ambulatory Visit: Payer: Self-pay

## 2018-07-09 DIAGNOSIS — C50211 Malignant neoplasm of upper-inner quadrant of right female breast: Secondary | ICD-10-CM

## 2018-07-09 DIAGNOSIS — Z170 Estrogen receptor positive status [ER+]: Secondary | ICD-10-CM

## 2018-07-09 MED ORDER — FUROSEMIDE 20 MG PO TABS: 20.0000 mg | ORAL_TABLET | Freq: Every day | ORAL | 1 refills | Status: DC

## 2018-07-13 ENCOUNTER — Telehealth: Payer: Self-pay | Admitting: *Deleted

## 2018-07-13 NOTE — Telephone Encounter (Signed)
Pt states the thumb on her left hand is oozing clear reddish exudate, middle finger is red,  swollen and hurting. States the nail is black and she is taking pain meds. Sandi Mealy, PA listened to voicemail- will see patient at 0900 tomorrow.

## 2018-07-14 ENCOUNTER — Encounter: Payer: Self-pay | Admitting: Medical

## 2018-07-14 ENCOUNTER — Other Ambulatory Visit: Payer: BLUE CROSS/BLUE SHIELD

## 2018-07-14 ENCOUNTER — Ambulatory Visit: Payer: BLUE CROSS/BLUE SHIELD

## 2018-07-14 ENCOUNTER — Inpatient Hospital Stay: Payer: BLUE CROSS/BLUE SHIELD | Attending: Hematology | Admitting: Medical

## 2018-07-14 ENCOUNTER — Ambulatory Visit: Payer: BLUE CROSS/BLUE SHIELD | Admitting: Nurse Practitioner

## 2018-07-14 ENCOUNTER — Encounter: Payer: Self-pay | Admitting: General Practice

## 2018-07-14 ENCOUNTER — Other Ambulatory Visit: Payer: Self-pay | Admitting: *Deleted

## 2018-07-14 VITALS — BP 136/77 | HR 85 | Temp 98.4°F | Resp 18 | Ht 64.0 in | Wt 162.7 lb

## 2018-07-14 DIAGNOSIS — C50211 Malignant neoplasm of upper-inner quadrant of right female breast: Secondary | ICD-10-CM

## 2018-07-14 DIAGNOSIS — Z5112 Encounter for antineoplastic immunotherapy: Secondary | ICD-10-CM | POA: Diagnosis not present

## 2018-07-14 DIAGNOSIS — Z5189 Encounter for other specified aftercare: Secondary | ICD-10-CM | POA: Diagnosis not present

## 2018-07-14 DIAGNOSIS — F419 Anxiety disorder, unspecified: Secondary | ICD-10-CM | POA: Diagnosis not present

## 2018-07-14 DIAGNOSIS — Z5111 Encounter for antineoplastic chemotherapy: Secondary | ICD-10-CM | POA: Diagnosis not present

## 2018-07-14 DIAGNOSIS — Z170 Estrogen receptor positive status [ER+]: Secondary | ICD-10-CM

## 2018-07-14 DIAGNOSIS — L609 Nail disorder, unspecified: Secondary | ICD-10-CM | POA: Diagnosis not present

## 2018-07-14 DIAGNOSIS — M791 Myalgia, unspecified site: Secondary | ICD-10-CM | POA: Diagnosis not present

## 2018-07-14 MED ORDER — ALPRAZOLAM 0.5 MG PO TABS: 0.5000 mg | ORAL_TABLET | Freq: Every evening | ORAL | 0 refills | Status: DC | PRN

## 2018-07-14 MED ORDER — CEPHALEXIN 500 MG PO CAPS: 500.0000 mg | ORAL_CAPSULE | Freq: Three times a day (TID) | ORAL | 0 refills | Status: DC

## 2018-07-14 NOTE — Progress Notes (Signed)
Patient was brought in by Van to discuss financial concerns she had. ° °Previously met with patient and provided my card to make appointment to discuss. Advised patient she may apply for the one-time $1000 [employer] grant to assist with personal expenses. Advised her what is needed to apply and she may bring on 07/23/18 and I will see her in infusion. Patient verbalized understanding. ° °Discussed insurance ded/OOP which she states she has met for this year but renews in January. Advised her we may apply for copay assistance if available for her treatment drugs if needed. ° °She had questions regarding applying for disability. Advised her I would send message to Social workers concerning this and other options for assistance with medical bills. She verbalized understanding.     ° °She has my card for any additional financial questions or concerns.  °

## 2018-07-14 NOTE — Progress Notes (Signed)
[facility] CSW Progress Note  Request received from [place], Estate manager/land agent, to talk w patient about applying for disability and other resources to address financial toxicity.  Reviewed chart, called patient and discussed the process of applying for disability.  Patient is currently out of work due to cancer treatment after completing treatment regimen.  CSW also discussed other options for limited financial support including [place], [place].  Mailed packet w applications and information on these resources as well as information about the [place].  Will ask CSW Wallis Bamberg to follow up w patient on 12/27 when patient returns for infusion.  Edwyna Shell, LCSW Clinical Social Worker Phone:  [phone]

## 2018-07-15 ENCOUNTER — Telehealth: Payer: Self-pay | Admitting: Hematology

## 2018-07-15 NOTE — Telephone Encounter (Signed)
LB moved 12/27 f/u to [facility] and adjusted associated appointments. Spoke with patient.

## 2018-07-16 ENCOUNTER — Other Ambulatory Visit: Payer: Self-pay | Admitting: Medical

## 2018-07-16 ENCOUNTER — Telehealth: Payer: Self-pay | Admitting: *Deleted

## 2018-07-16 ENCOUNTER — Ambulatory Visit: Payer: BLUE CROSS/BLUE SHIELD

## 2018-07-16 MED ORDER — MUPIROCIN 2 % EX OINT: TOPICAL_OINTMENT | CUTANEOUS | 1 refills | Status: DC

## 2018-07-16 NOTE — Telephone Encounter (Signed)
Scheduled and confirmed breast MRI for 12/28 at 8:45am. Pt inform her thumbs are still the same and that her middle finger has "burst open and is oozing and bleeding". Informed pt that I would let [place] PA know. Denies further needs at this time.

## 2018-07-16 NOTE — Progress Notes (Signed)
[facility] Progress Note   KATTIA SELLEY [id number] 12-06-1965 52 y.o.  LUPE HANDLEY is managed by Dr. Truitt Merle  Actively treated with chemotherapy/immunotherapy/hormonal therapy: yes  Current Therapy: Carboplatin, docetaxel, pertuzumab, trastuzumab with the Udenyca support  Last Treated: 06/23/2018 (cycle 5, day 1)  Assessment: Plan:    Nail disorder - Plan: cephALEXin (KEFLEX) 500 MG capsule  Malignant neoplasm of upper-inner quadrant of right breast in female, estrogen receptor positive ([place])  Anxiety - Plan: ALPRAZolam (XANAX) 0.5 MG tablet  Myalgia   Nail toxicity: Patient was given a prescription for Keflex 500 mg p.o. 3 times daily x7 days.  She was told that she could continue to soak her nails.  I suggested that she use warm salt water soaks.  She had been told that she could use hydrogen peroxide soaks also.  ER positive malignant neoplasm of the right breast: The patient is status post cycle 5, day 1 of carboplatin, docetaxel, pertuzumab, trastuzumab with the Udenyca support which was dosed on 06/23/2018.  Anxiety: The patient was given a refill of Xanax 0.5 mg p.o. nightly as needed.  Myalgia: The patient was instructed that she could take Motrin 400 mg 2-3 times daily as long as her platelet counts are stable.  Please see After Visit Summary for patient specific instructions.  Future Appointments  Date Time Provider [place]  07/23/2018  8:30 AM [facility] 2 [facility] None  07/23/2018  8:45 AM [facility] [place] FLUSH [facility] None  07/23/2018  9:15 AM Truitt Merle, MD [facility] None  07/23/2018 10:15 AM [facility] INFUSION [facility] None  07/24/2018  9:00 AM [facility] 1 [facility]  07/24/2018  2:00 PM [facility] [place] FLUSH [facility] None    No orders of the defined types were placed in this encounter.      Subjective:   Patient ID:  MACHELL WIRTHLIN is a 52 y.o. (DOB 10/07/1965) female.  Chief Complaint:   Chief Complaint  Patient presents with  . Edema    Face, hands, and feet  . Hand Pain    Black finger nails; oozing; painful    HPI ERILYN PEARMAN is a 52 year old female with a history of a stage Ib, ER positive/PR positive/HER-2/neu positive malignant neoplasm of the right breast who is managed by Dr. Stefani Dama.  She is status post cycle 5, day 1 of carboplatin, docetaxel, pertuzumab, trastuzumab with the Udenyca support which was dosed on 06/23/2018.  She presents to the office today with continued nail toxicity.  Several of her nails are black and are oozing foul-smelling material.  She reports having muscle aches in her arms and legs.  She has swelling of her face, hands, and feet.  She request a refill of her Xanax.  She reports that she is recently lost her job.  She states that her husband continues to rebuild his job after being involved in a serious motor vehicle accident earlier this year having been out of work for an extended period.  Medications: I have reviewed the patient's current medications.  Allergies:  Allergies  Allergen Reactions  . Metrogel [Metronidazole]     Burned her face  . Other     Dental Cement--tongue and cheek burning  . Paxil [Paroxetine Hcl]     Lethargic/felt bad   . Prednisone     Turns skin red, heart racing  . Tramadol Itching    But does tolerate  . Zomig [Zolmitriptan]     Elevated blood pressure  Past Medical History:  Diagnosis Date  . Abdominal pain, unspecified site   . Anxiety state, unspecified   . Cancer [facility])    R breast cancer  . Contact dermatitis and other eczema, due to unspecified cause   . Essential hypertension, benign   . Family history of colon cancer   . Family history of melanoma   . Family history of prostate cancer   . GERD (gastroesophageal reflux disease)   . Hematuria, unspecified   . Hypertension   . Lower back pain   . Mixed hyperlipidemia   . Rosacea   . Unspecified symptom associated with  female genital organs     Past Surgical History:  Procedure Laterality Date  . CESAREAN SECTION    . DIAGNOSTIC LAPAROSCOPY    . LAPAROSCOPIC APPENDECTOMY    . PORTACATH PLACEMENT Left 04/13/2018   Procedure: INSERTION PORT-A-CATH WITH [place];  Surgeon: Fanny Skates, MD;  Location: [facility];  Service: General;  Laterality: Left;  . UTERINE FIBROID SURGERY      Family History  Problem Relation Age of Onset  . Heart failure Mother   . Melanoma Mother 6       has had several removed over the past ten years  . Prostate cancer Father 68  . Heart attack Maternal Grandmother   . Heart attack Maternal Grandfather        possible cancer, unknown type  . Stroke Paternal Grandmother   . Colon cancer Paternal Grandfather        dx upper 50s  . Hypertension Other   . Heart Problems Maternal Aunt        d.60  . Heart Problems Maternal Uncle        d.60  . Colon cancer Paternal Aunt        dx 21s  . Colon cancer Paternal Uncle        dx 66s    Social History   Socioeconomic History  . Marital status: Married    Spouse name: Not on file  . Number of children: Not on file  . Years of education: Not on file  . Highest education level: Not on file  Occupational History  . Not on file  Social Needs  . Financial resource strain: Not on file  . Food insecurity:    Worry: Not on file    Inability: Not on file  . Transportation needs:    Medical: Not on file    Non-medical: Not on file  Tobacco Use  . Smoking status: Never Smoker  . Smokeless tobacco: Never Used  Substance and Sexual Activity  . Alcohol use: No  . Drug use: Never  . Sexual activity: Not on file  Lifestyle  . Physical activity:    Days per week: Not on file    Minutes per session: Not on file  . Stress: Not on file  Relationships  . Social connections:    Talks on phone: Not on file    Gets together: Not on file    Attends religious service: Not on file    Active member of club or  organization: Not on file    Attends meetings of clubs or organizations: Not on file    Relationship status: Not on file  . Intimate partner violence:    Fear of current or ex partner: Not on file    Emotionally abused: Not on file    Physically abused: Not on file    Forced sexual  activity: Not on file  Other Topics Concern  . Not on file  Social History Narrative  . Not on file    Past Medical History, Surgical history, Social history, and Family history were reviewed and updated as appropriate.   Please see review of systems for further details on the patient's review from today.   Review of Systems:  Review of Systems  Constitutional: Negative for chills, diaphoresis and fever.  HENT: Negative for trouble swallowing and voice change.   Respiratory: Negative for cough, chest tightness, shortness of breath and wheezing.   Cardiovascular: Positive for leg swelling. Negative for chest pain and palpitations.  Gastrointestinal: Negative for abdominal pain, constipation, diarrhea, nausea and vomiting.  Musculoskeletal: Positive for myalgias. Negative for back pain.  Skin:       Hyperpigmented nails with nail avulsions and drainage from the nail bed.  Neurological: Negative for dizziness, light-headedness and headaches.  Psychiatric/Behavioral: Positive for sleep disturbance. The patient is nervous/anxious.     Objective:   Physical Exam:  BP 136/77 (BP Location: Right Arm, Patient Position: Sitting)   Pulse 85   Temp 98.4 F (36.9 C) (Oral)   Resp 18   Ht 5' 4"  (1.626 m)   Wt 162 lb 11.2 oz (73.8 kg)   SpO2 100%   BMI 27.93 kg/m  ECOG: 0  Physical Exam Constitutional:      General: She is not in acute distress.    Appearance: She is not diaphoretic.  HENT:     Head: Normocephalic and atraumatic.  Cardiovascular:     Rate and Rhythm: Normal rate and regular rhythm.     Heart sounds: Normal heart sounds. No murmur. No friction rub. No gallop.   Pulmonary:      Effort: Pulmonary effort is normal. No respiratory distress.     Breath sounds: Normal breath sounds. No wheezing or rales.  Musculoskeletal:        General: Swelling (Swelling of the hands bilaterally.) present.     Comments: Calves are bilaterally equal at 37-1/2 cm at 10 cm distal to the inferior pole of the patella.  Skin:    General: Skin is warm and dry.     Findings: No erythema or rash.     Comments: Nails are hyperpigmented and are avulsing.  No exudate was noted.  There is mild erythema and swelling along the medial edge of the left third finger.  Neurological:     Mental Status: She is alert.     Lab Review:     Component Value Date/Time   NA 143 06/23/2018 0941   K 3.3 (L) 06/23/2018 0941   CL 107 06/23/2018 0941   CO2 26 06/23/2018 0941   GLUCOSE 108 (H) 06/23/2018 0941   BUN 10 06/23/2018 0941   CREATININE 0.61 06/23/2018 0941   CALCIUM 8.9 06/23/2018 0941   PROT 5.9 (L) 06/23/2018 0941   ALBUMIN 3.3 (L) 06/23/2018 0941   AST 19 06/23/2018 0941   ALT 12 06/23/2018 0941   ALKPHOS 90 06/23/2018 0941   BILITOT 0.4 06/23/2018 0941   GFRNONAA >60 06/23/2018 0941   GFRAA >60 06/23/2018 0941       Component Value Date/Time   WBC 4.4 06/23/2018 0941   WBC 9.0 03/16/2013 1810   RBC 2.99 (L) 06/23/2018 0941   HGB 10.3 (L) 06/23/2018 0941   HCT 30.6 (L) 06/23/2018 0941   PLT 172 06/23/2018 0941   MCV 102.3 (H) 06/23/2018 0941   MCH 34.4 (H) 06/23/2018 7591  MCHC 33.7 06/23/2018 0941   RDW 16.1 (H) 06/23/2018 0941   LYMPHSABS 1.2 06/23/2018 0941   MONOABS 0.4 06/23/2018 0941   EOSABS 0.0 06/23/2018 0941   BASOSABS 0.0 06/23/2018 0941   -------------------------------  Imaging from last 24 hours (if applicable):  Radiology interpretation: No results found.

## 2018-07-16 NOTE — Telephone Encounter (Signed)
Called pt and informed Claire Guzman will call in a prescription for topical abx. Received verbal understanding. Denies further needs.

## 2018-07-18 ENCOUNTER — Other Ambulatory Visit: Payer: Self-pay | Admitting: Hematology

## 2018-07-22 DIAGNOSIS — C50211 Malignant neoplasm of upper-inner quadrant of right female breast: Secondary | ICD-10-CM | POA: Diagnosis not present

## 2018-07-22 DIAGNOSIS — Z170 Estrogen receptor positive status [ER+]: Secondary | ICD-10-CM | POA: Diagnosis not present

## 2018-07-22 NOTE — Progress Notes (Signed)
[place]   [phone] [phone]   Clinic Follow up Note   Patient Care Team: Lujean Amel, MD as PCP - General (Family Medicine) Fanny Skates, MD as Consulting Physician (General Surgery) Truitt Merle, MD as Consulting Physician (Hematology) Eppie Gibson, MD as Attending Physician (Radiation Oncology)  Date of Service:  07/23/2018  CHIEF COMPLAINT: F/u for right breast cancer  SUMMARY OF ONCOLOGIC HISTORY: Oncology History   Cancer Staging Malignant neoplasm of upper-inner quadrant of right breast in female, estrogen receptor positive ([name]) Staging form: Breast, AJCC 8th Edition - Clinical stage from 03/08/2018: Stage IB (cT2, cN1, cM0, G3, ER+, PR+, HER2+) - Signed by Truitt Merle, MD on 03/16/2018       Malignant neoplasm of upper-inner quadrant of right breast in female, estrogen receptor positive ([name])   02/24/2018 Mammogram    screening mammogram on 02/24/2018 that was benign    03/04/2018 Mammogram    diagnostic mammogram on 03/04/2018 due to a palpable mass on her right breast. Diagnostic mammogram, breast US revealed and biopsy revealed suspicious mass in the right breast at 2:30 at the palpable site of concern and one suspicious right axillary LN.    03/08/2018 Cancer Staging    Staging form: Breast, AJCC 8th Edition - Clinical stage from 03/08/2018: Stage IB (cT2, cN1, cM0, G3, ER+, PR+, HER2+) - Signed by Truitt Merle, MD on 03/16/2018    03/08/2018 Receptors her2    Her2 positive, ER 90% PR 5% and Ki67 60%    03/08/2018 Pathology Results    Diagnosis 1. Breast, right, needle core biopsy, 2:30 - INVASIVE DUCTAL CARCINOMA, GRADE 3. - LYMPHOVASCULAR SPACE INVOLVEMENT BY TUMOR. 2. Lymph node, needle/core biopsy, right axilla - METASTATIC CARCINOMA INVOLVING SCANT LYMPHOID TISSUE.    03/10/2018 Initial Diagnosis    Malignant neoplasm of upper-inner quadrant of right breast in female, estrogen receptor positive ([name])    03/20/2018 Imaging     MRI brain 03/20/18 IMPRESSION: No evidence of metastatic disease.  Normal appearance of brain. Slightly heterogeneous marrow pattern of the upper cervical spine, nonspecific but likely benign, especially in the setting of early stage disease.     03/24/2018 Echocardiogram    Baseline ECHO 03/24/18 Study Conclusions - Left ventricle: The cavity size was normal. Wall thickness was   increased in a pattern of mild LVH. Systolic function was normal.   The estimated ejection fraction was in the range of 60% to 65%.   Wall motion was normal; there were no regional wall motion   abnormalities. Doppler parameters are consistent with abnormal   left ventricular relaxation (grade 1 diastolic dysfunction). - Right ventricle: The cavity size was mildly dilated. Wall   thickness was normal.    03/25/2018 Imaging    MRI Breast B/l 03/25/18 IMPRESSION: 1. The patient's primary malignancy in the posterior right breast measures 3.2 x 2 x 2.6 cm with apparent invasion of the underlying pectoralis muscle. Numerous surrounding abnormal enhancing masses, consistent with satellite lesions, extend from 11 o'clock in the right breast into the inferolateral right breast with a total span of suspected disease measuring 4.5 x 4.6 x 6.4 cm in AP, transverse, and craniocaudal dimension. The suspected extent of disease involves the superior and inferolateral quadrants. The primary malignancy also extends just across midline into the superior medial quadrant. 2. No MRI evidence of malignancy in the left breast. 3. Known metastatic noted in the right axilla.    03/25/2018 Imaging    Whole body bone scan 03/25/18  IMPRESSION: No scintigraphic evidence skeletal metastasis.    03/25/2018 Imaging    CT CAP W Contrast 03/25/18 IMPRESSION: 1. Mass within the deep aspect of the right breast along the pectoralis muscle compatible with primary breast malignancy. 2. There is suggestion of two additional possible masses  within the lateral aspect of the right breast versus nodular appearing breast tissue. Recommend dedicated evaluation with bilateral breast MRI. 3. Mildly thickened right axillary lymph node compatible with metastatic adenopathy, recently biopsied. 4. Multiple bilateral pulmonary nodules are indeterminate, potentially sequelae of prior infectious/inflammatory process. Recommend attention on follow-up. Consider follow-up CT in 6 months. 5. Portal venous gas is demonstrated within the left hepatic lobe. Additionally, there is wall thickening of the cecum and ascending colon with small amount of gas within the pericolonic vasculature. Constellation of findings is indeterminate in etiology however may be secondary to colitis at this location with considerations including infectious, inflammatory or ischemic etiologies. Correlate for recent procedure. If patient is not up-to-date for colonic screening, recommend further evaluation with colonoscopy to exclude the possibility of colonic mass within the cecum/ascending colon. 6. These results were called by telephone at the time of interpretation on 03/25/2018 at 9:31 am to Dr. Truitt Merle , who verbally acknowledged these results.    03/30/2018 -  Chemotherapy    Neoadjuvant TCHP every 3 weeks starting 03/30/18. Completed 6 cycles of TCHP on 07/23/18.  Continued with maintenance Herceptin/Perjeta q3weeks starting 08/13/17 to complete 1 year.      Genetic Testing    Negative genetic testing on the [employer] Common Hereditary Cancers Panel. The Common Hereditary Cancers Panel offered by [facility] includes sequencing and/or deletion duplication testing of the following 47 genes: APC, ATM, AXIN2, BARD1, BMPR1A, BRCA1, BRCA2, BRIP1, CDH1, CDKN2A (p14ARF), CDKN2A (p16INK4a), CKD4, CHEK2, CTNNA1, DICER1, EPCAM (Deletion/duplication testing only), GREM1 (promoter region deletion/duplication testing only), KIT, MEN1, MLH1, MSH2, MSH3, MSH6, MUTYH, NBN, NF1, NHTL1,  PALB2, PDGFRA, PMS2, POLD1, POLE, PTEN, RAD50, RAD51C, RAD51D, SDHB, SDHC, SDHD, SMAD4, SMARCA4. STK11, TP53, TSC1, TSC2, and VHL.  The following genes were evaluated for sequence changes only: SDHA and HOXB13 c.251G>A variant only.   Genetic testing did detect a Variant of Unknown Significance (VUS) in the POLD1 gene called c.985C>T (p.Pro329Ser). At this time, it is unknown if this variant is associated with increased cancer risk or if this is a normal finding, but most variants such as this get reclassified to being inconsequential. It should not be used to make medical management decisions.   The report date is 04/09/2018.      CURRENT THERAPY:  Neoadjuvant TCHP every 3 weeks starting 03/30/18, plan for 6 cycles, carbo dose reduction from cycle 2 due to poor tolerance  INTERVAL HISTORY:  YAZMYNE SARA is here for a follow up by herself. She notes she has a cold which started last night with draining into her throat. She had a vagal reaction with port access in clinic today.   She notes tolerating last treatment moderately well with muscle soreness. Her MRI breast is tomorrow. She prefers mastectomy and reconstruction.  She notes she was fired from her job due to her lower availability. She has spoken to Financial staff who will help her with grants for medical bill coverage.   REVIEW OF SYSTEMS:   Constitutional: Denies fevers, chills or abnormal weight loss Eyes: Denies blurriness of vision Ears, nose, mouth, throat, and face: Denies mucositis or sore throat Respiratory: Denies dyspnea or wheezes (+) cold symptoms  Cardiovascular: Denies palpitation, chest discomfort  or lower extremity swelling MSK: (+) Muscle soreness Gastrointestinal:  Denies nausea, heartburn or change in bowel habits Skin: Denies abnormal skin rashes Lymphatics: Denies new lymphadenopathy or easy bruising Neurological:Denies numbness, tingling or new weaknesses Behavioral/Psych: Mood is stable, no new changes    All other systems were reviewed with the patient and are negative.  MEDICAL HISTORY:  Past Medical History:  Diagnosis Date  . Abdominal pain, unspecified site   . Anxiety state, unspecified   . Cancer [facility])    R breast cancer  . Contact dermatitis and other eczema, due to unspecified cause   . Essential hypertension, benign   . Family history of colon cancer   . Family history of melanoma   . Family history of prostate cancer   . GERD (gastroesophageal reflux disease)   . Hematuria, unspecified   . Hypertension   . Lower back pain   . Mixed hyperlipidemia   . Rosacea   . Unspecified symptom associated with female genital organs     SURGICAL HISTORY: Past Surgical History:  Procedure Laterality Date  . CESAREAN SECTION    . DIAGNOSTIC LAPAROSCOPY    . LAPAROSCOPIC APPENDECTOMY    . PORTACATH PLACEMENT Left 04/13/2018   Procedure: INSERTION PORT-A-CATH WITH [place];  Surgeon: Fanny Skates, MD;  Location: [place];  Service: General;  Laterality: Left;  . UTERINE FIBROID SURGERY      I have reviewed the social history and family history with the patient and they are unchanged from previous note.  ALLERGIES:  is allergic to metrogel [metronidazole]; other; paxil [paroxetine hcl]; prednisone; tramadol; and zomig [zolmitriptan].  MEDICATIONS:  Current Outpatient Medications  Medication Sig Dispense Refill  . ALPRAZolam (XANAX) 0.5 MG tablet Take 1 tablet (0.5 mg total) by mouth at bedtime as needed for anxiety. 30 tablet 0  . atenolol (TENORMIN) 50 MG tablet Take 50 mg by mouth at bedtime.    . Baclofen 5 MG TABS TAKE 1 TO 2 TABLETS BY MOUTH EVERY 8 HOURS AS NEEDED FOR EYE LID SPASMS 30 tablet 0  . cephALEXin (KEFLEX) 500 MG capsule Take 1 capsule (500 mg total) by mouth 3 (three) times daily. 21 capsule 0  . cyclobenzaprine (FLEXERIL) 5 MG tablet Take 1 tablet (5 mg total) by mouth 3 (three) times daily as needed for muscle spasms. 30 tablet 0  .  dexamethasone (DECADRON) 4 MG tablet Take 1 tablet (4 mg total) by mouth 2 (two) times daily. Start the day before Taxotere. Take once the day after, then 2 times a day x 2d. 30 tablet 1  . doxycycline (VIBRA-TABS) 100 MG tablet Take 1 tablet (100 mg total) by mouth 2 (two) times daily. 60 tablet 0  . furosemide (LASIX) 20 MG tablet Take 1 tablet (20 mg total) by mouth daily. 20 tablet 1  . HYDROcodone-acetaminophen (NORCO) 5-325 MG tablet Take 1-2 tablets by mouth every 6 (six) hours as needed for moderate pain or severe pain. 20 tablet 0  . lidocaine-prilocaine (EMLA) cream Apply to affected area once 30 g 3  . mupirocin ointment (BACTROBAN) 2 % Apply to affected area 3 times daily until better 30 g 1  . omeprazole (PRILOSEC) 20 MG capsule Take 1 capsule (20 mg total) by mouth 2 (two) times daily before a meal. 60 capsule 2  . ondansetron (ZOFRAN) 8 MG tablet Take 1 tablet (8 mg total) by mouth 2 (two) times daily as needed (Nausea or vomiting). Begin 4 days after chemotherapy. 30 tablet 1  .  potassium chloride SA (K-DUR,KLOR-CON) 20 MEQ tablet TAKE 1 TABLET(20 MEQ) BY MOUTH TWICE DAILY 30 tablet 0  . prochlorperazine (COMPAZINE) 10 MG tablet TAKE 1 TABLET BY MOUTH EVERY 6 HOURS AS NEEDED FOR NAUSEA OR VOMITING 30 tablet 2  . traMADol (ULTRAM) 50 MG tablet Take 1 tablet (50 mg total) by mouth every 12 (twelve) hours as needed. 30 tablet 0  . triamcinolone ointment (KENALOG) 0.5 % Apply 1 application topically 2 (two) times daily. 30 g 1  . venlafaxine (EFFEXOR) 37.5 MG tablet Take 37.5 mg by mouth 2 (two) times daily.     No current facility-administered medications for this visit.     PHYSICAL EXAMINATION: ECOG PERFORMANCE STATUS: 1 - Symptomatic but completely ambulatory  Vitals:   07/23/18 1024  BP: 126/80  Pulse: 87  Resp: 18  SpO2: 100%   There were no vitals filed for this visit.  GENERAL:alert, no distress and comfortable SKIN: skin color, texture, turgor are normal, no rashes  or significant lesions EYES: normal, Conjunctiva are pink and non-injected, sclera clear OROPHARYNX:no exudate, no erythema and lips, buccal mucosa, and tongue normal (+) tiny white patch on right and left side of throat.  NECK: supple, thyroid normal size, non-tender, without nodularity LYMPH:  no palpable lymphadenopathy in the cervical, axillary or inguinal LUNGS: clear to auscultation and percussion with normal breathing effort HEART: regular rate & rhythm and no murmurs and no lower extremity edema ABDOMEN:abdomen soft, non-tender and normal bowel sounds Musculoskeletal:no cyanosis of digits and no clubbing  NEURO: alert & oriented x 3 with fluent speech, no focal motor/sensory deficits  LABORATORY DATA:  I have reviewed the data as listed CBC Latest Ref Rng & Units 07/23/2018 06/23/2018 06/02/2018  WBC 4.0 - 10.5 K/uL 6.3 4.4 4.3  Hemoglobin 12.0 - 15.0 g/dL 11.4(L) 10.3(L) 11.1(L)  Hematocrit 36.0 - 46.0 % 33.5(L) 30.6(L) 32.6(L)  Platelets 150 - 400 K/uL 132(L) 172 163     CMP Latest Ref Rng & Units 07/23/2018 06/23/2018 06/02/2018  Glucose 70 - 99 mg/dL 107(H) 108(H) 135(H)  BUN 6 - 20 mg/dL _0 Creatinine 0.44 - 1.00 mg/dL 0.65 0.61 0.66  Sodium 135 - 145 mmol/L 142 143 142  Potassium 3.5 - 5.1 mmol/L 2.9(LL) 3.3(L) 3.4(L)  Chloride 98 - 111 mmol/L 107 107 106  CO2 22 - 32 mmol/L _1 Calcium 8.9 - 10.3 mg/dL 9.2 8.9 8.9  Total Protein 6.5 - 8.1 g/dL 6.6 5.9(L) 6.5  Total Bilirubin 0.3 - 1.2 mg/dL 0.7 0.4 0.4  Alkaline Phos 38 - 126 U/L 102 90 95  AST 15 - 41 U/L _2 ALT 0 - 44 U/L _3 RADIOGRAPHIC STUDIES: I have personally reviewed the radiological images as listed and agreed with the findings in the report. No results found.   ASSESSMENT & PLAN:  Claire Guzman is a 52 y.o. female with    1.Malignant neoplasm of upper-inner quadrant of right breast in female, invasive ductal carcinoma, cT2N1M0 stage IB, ER+/PR+/HER2+, G3  -She  was diagnosed in 02/2018. Given the size of her mass and LN involvement, which predicts high risk of recurrence, I recommended Neoadjuvant chemo TCHP. She started on 03/30/18.  -She has tolerated moderately well with skin rash and body aches. Due to her tolerance issue, I previously decreased her carboplatin to AUC 5 from cycle 2. -Lab reviewed, adequate for treatment, will proceed last cycle TC P today -She will  proceed with Breast MRI tomorrow to evaluate response to treatment before proceeding with surgery. She prefers mastectomy with reconstruction.  -Will continue with maintenance Herceptin and Perjeta to complete 1 year of treatment. -If she has significant residual disease after neoadjuvant chemo, will consider adjuvant Kadcyla -She had vagal response to [place] accessing today, She was given IV Fluids in clinic. BP normalized and she recovered well  -She was recently fired from her job and has no income. She will meet with our financial office for financial assistance  -Labs reviewed, Hg mild at 11.4, plt at 132, CMp and Ca 27/29 still pending. Overall adequate to proceed with last cycle TCHP today.  -F/u in 3 weeks  -she is scheduled to see Dr. Dalbert Batman next week  2. Genetics  -Genetic testing performed through[facility]'s Common Hereditary Cancers Panelreported out on 9/16/2019showed no pathogenic mutations.   3. Anxiety  -She has history of anxiety, currently on Xanax as needed -Stable  4. HTN -She will continue atenolol. We will monitor her blood pressure closely during the chemotherapy, and adjust her medication as needed.  5. Skin rash -Red rash on her back, upper chest, face and scalp, likely related to Herceptin and Perjeta -On over-the-counter hydrocortisone cream and doxycycline, which cleared the rash.   6. Body pain -Secondary to chemotherapy and Neulasta -She will use tramadol and norco as needed for severe pain -Improved after chemo dose reduction from cycle 2.  Should resolve after she completes chemo  7. Bilateral small lung nodules -Her staging CT scan showed bilateral multiple small lung nodules, 3 to 4 mm, indeterminate, likely benign.  She is a never smoker -Plan to monitor repeat CT chest without contrast in 3-6 months   8. Hand and leg edema -Mild, and symmetric, likely related to steroids and chemo, unlikely DVT. -She tried Lasix 20 mg daily to twice daily, did not help her much. -I encouraged her to use compression stockings during the day -overall improved   9. Cold Symptoms, afebrile -She has tiny white patches in back of throat.  We will swab for strep, if positive, I will call in antibiotics, if negative then will monitor clinically     Plan: -Labs reviewed and adequate to proceed with last cycle TCHP today  -Lab, flush, f/u and Herceptin/Perjeta in 3 weeks  -breast MRI tomorrow and f/u with surgeon next week     No problem-specific Assessment & Plan notes found for this encounter.   No orders of the defined types were placed in this encounter.  All questions were answered. The patient knows to call the clinic with any problems, questions or concerns. No barriers to learning was detected. I spent 20 minutes counseling the patient face to face. The total time spent in the appointment was 25 minutes and more than 50% was on counseling and review of test results     Truitt Merle, MD 07/23/2018   I, Joslyn Devon, am acting as scribe for Truitt Merle, MD.   I have reviewed the above documentation for accuracy and completeness, and I agree with the above.

## 2018-07-23 ENCOUNTER — Telehealth: Payer: Self-pay | Admitting: Hematology

## 2018-07-23 ENCOUNTER — Other Ambulatory Visit: Payer: BLUE CROSS/BLUE SHIELD

## 2018-07-23 ENCOUNTER — Inpatient Hospital Stay: Payer: BLUE CROSS/BLUE SHIELD

## 2018-07-23 ENCOUNTER — Other Ambulatory Visit: Payer: Self-pay | Admitting: Medical

## 2018-07-23 ENCOUNTER — Inpatient Hospital Stay (HOSPITAL_BASED_OUTPATIENT_CLINIC_OR_DEPARTMENT_OTHER): Payer: BLUE CROSS/BLUE SHIELD | Admitting: Hematology

## 2018-07-23 ENCOUNTER — Ambulatory Visit: Payer: BLUE CROSS/BLUE SHIELD | Admitting: Nurse Practitioner

## 2018-07-23 ENCOUNTER — Ambulatory Visit: Payer: BLUE CROSS/BLUE SHIELD

## 2018-07-23 VITALS — BP 126/80 | HR 87 | Resp 18

## 2018-07-23 DIAGNOSIS — Z5189 Encounter for other specified aftercare: Secondary | ICD-10-CM | POA: Diagnosis not present

## 2018-07-23 DIAGNOSIS — Z5112 Encounter for antineoplastic immunotherapy: Secondary | ICD-10-CM | POA: Diagnosis not present

## 2018-07-23 DIAGNOSIS — Z170 Estrogen receptor positive status [ER+]: Secondary | ICD-10-CM

## 2018-07-23 DIAGNOSIS — F419 Anxiety disorder, unspecified: Secondary | ICD-10-CM

## 2018-07-23 DIAGNOSIS — C50211 Malignant neoplasm of upper-inner quadrant of right female breast: Secondary | ICD-10-CM

## 2018-07-23 DIAGNOSIS — L609 Nail disorder, unspecified: Secondary | ICD-10-CM | POA: Diagnosis not present

## 2018-07-23 DIAGNOSIS — I10 Essential (primary) hypertension: Secondary | ICD-10-CM | POA: Diagnosis not present

## 2018-07-23 DIAGNOSIS — J029 Acute pharyngitis, unspecified: Secondary | ICD-10-CM

## 2018-07-23 DIAGNOSIS — R609 Edema, unspecified: Secondary | ICD-10-CM

## 2018-07-23 DIAGNOSIS — M791 Myalgia, unspecified site: Secondary | ICD-10-CM | POA: Diagnosis not present

## 2018-07-23 DIAGNOSIS — R918 Other nonspecific abnormal finding of lung field: Secondary | ICD-10-CM | POA: Diagnosis not present

## 2018-07-23 DIAGNOSIS — R21 Rash and other nonspecific skin eruption: Secondary | ICD-10-CM

## 2018-07-23 DIAGNOSIS — Z5111 Encounter for antineoplastic chemotherapy: Secondary | ICD-10-CM | POA: Diagnosis not present

## 2018-07-23 LAB — CBC WITH DIFFERENTIAL (CANCER CENTER ONLY)
Abs Immature Granulocytes: 0.01 K/uL (ref 0.00–0.07)
Basophils Absolute: 0 K/uL (ref 0.0–0.1)
Basophils Relative: 0 %
Eosinophils Absolute: 0.2 K/uL (ref 0.0–0.5)
Eosinophils Relative: 2 %
HCT: 33.5 % — ABNORMAL LOW (ref 36.0–46.0)
Hemoglobin: 11.4 g/dL — ABNORMAL LOW (ref 12.0–15.0)
Immature Granulocytes: 0 %
Lymphocytes Relative: 12 %
Lymphs Abs: 0.8 K/uL (ref 0.7–4.0)
MCH: 34.8 pg — ABNORMAL HIGH (ref 26.0–34.0)
MCHC: 34 g/dL (ref 30.0–36.0)
MCV: 102.1 fL — ABNORMAL HIGH (ref 80.0–100.0)
Monocytes Absolute: 0.4 K/uL (ref 0.1–1.0)
Monocytes Relative: 7 %
Neutro Abs: 5 K/uL (ref 1.7–7.7)
Neutrophils Relative %: 79 %
Platelet Count: 132 K/uL — ABNORMAL LOW (ref 150–400)
RBC: 3.28 MIL/uL — ABNORMAL LOW (ref 3.87–5.11)
RDW: 12.6 % (ref 11.5–15.5)
WBC Count: 6.3 K/uL (ref 4.0–10.5)
nRBC: 0 % (ref 0.0–0.2)

## 2018-07-23 LAB — CMP (CANCER CENTER ONLY)
ALT: 14 U/L (ref 0–44)
AST: 18 U/L (ref 15–41)
Albumin: 3.7 g/dL (ref 3.5–5.0)
Alkaline Phosphatase: 102 U/L (ref 38–126)
Anion gap: 9 (ref 5–15)
BUN: 10 mg/dL (ref 6–20)
CO2: 26 mmol/L (ref 22–32)
Calcium: 9.2 mg/dL (ref 8.9–10.3)
Chloride: 107 mmol/L (ref 98–111)
Creatinine: 0.65 mg/dL (ref 0.44–1.00)
GFR, Est AFR Am: >60 mL/min
GFR, Estimated: >60 mL/min
Glucose, Bld: 107 mg/dL — ABNORMAL HIGH (ref 70–99)
Potassium: 2.9 mmol/L — CL (ref 3.5–5.1)
Sodium: 142 mmol/L (ref 135–145)
Total Bilirubin: 0.7 mg/dL (ref 0.3–1.2)
Total Protein: 6.6 g/dL (ref 6.5–8.1)

## 2018-07-23 LAB — GROUP A STREP BY PCR: Group A Strep by PCR: NOT DETECTED

## 2018-07-23 MED ORDER — SODIUM CHLORIDE 0.9 % IV SOLN
420.0000 mg | Freq: Once | INTRAVENOUS | Status: AC
  Administered 2018-07-23: 420 mg via INTRAVENOUS
  Filled 2018-07-23: qty 14

## 2018-07-23 MED ORDER — ONDANSETRON HCL 8 MG PO TABS: 8.0000 mg | ORAL_TABLET | Freq: Two times a day (BID) | ORAL | 1 refills | Status: DC | PRN

## 2018-07-23 MED ORDER — POTASSIUM CHLORIDE CRYS ER 20 MEQ PO TBCR
EXTENDED_RELEASE_TABLET | ORAL | Status: AC
  Filled 2018-07-23: qty 1

## 2018-07-23 MED ORDER — SODIUM CHLORIDE 0.9 % IV SOLN
580.9250 mg | Freq: Once | INTRAVENOUS | Status: AC
  Administered 2018-07-23: 580 mg via INTRAVENOUS
  Filled 2018-07-23: qty 58

## 2018-07-23 MED ORDER — SODIUM CHLORIDE 0.9 % IV SOLN
Freq: Once | INTRAVENOUS | Status: AC
  Administered 2018-07-23: 11:00:00 via INTRAVENOUS
  Filled 2018-07-23: qty 250

## 2018-07-23 MED ORDER — SODIUM CHLORIDE 0.9 % IV SOLN
Freq: Once | INTRAVENOUS | Status: AC
  Administered 2018-07-23: 11:00:00 via INTRAVENOUS
  Filled 2018-07-23: qty 5

## 2018-07-23 MED ORDER — TRASTUZUMAB CHEMO 150 MG IV SOLR
450.0000 mg | Freq: Once | INTRAVENOUS | Status: AC
  Administered 2018-07-23: 450 mg via INTRAVENOUS
  Filled 2018-07-23: qty 21.43

## 2018-07-23 MED ORDER — POTASSIUM CHLORIDE CRYS ER 20 MEQ PO TBCR
20.0000 meq | EXTENDED_RELEASE_TABLET | Freq: Once | ORAL | Status: AC
  Administered 2018-07-23: 20 meq via ORAL

## 2018-07-23 MED ORDER — ACETAMINOPHEN 325 MG PO TABS
ORAL_TABLET | ORAL | Status: AC
  Filled 2018-07-23: qty 2

## 2018-07-23 MED ORDER — SODIUM CHLORIDE 0.9% FLUSH
10.0000 mL | INTRAVENOUS | Status: DC | PRN
  Administered 2018-07-23: 10 mL
  Filled 2018-07-23: qty 10

## 2018-07-23 MED ORDER — DIPHENHYDRAMINE HCL 25 MG PO CAPS
ORAL_CAPSULE | ORAL | Status: AC
  Filled 2018-07-23: qty 2

## 2018-07-23 MED ORDER — HEPARIN SOD (PORK) LOCK FLUSH 100 UNIT/ML IV SOLN
500.0000 [IU] | Freq: Once | INTRAVENOUS | Status: AC | PRN
  Administered 2018-07-23: 500 [IU]
  Filled 2018-07-23: qty 5

## 2018-07-23 MED ORDER — SODIUM CHLORIDE 0.9 % IV SOLN
70.0000 mg/m2 | Freq: Once | INTRAVENOUS | Status: AC
  Administered 2018-07-23: 130 mg via INTRAVENOUS
  Filled 2018-07-23: qty 13

## 2018-07-23 MED ORDER — PROCHLORPERAZINE MALEATE 10 MG PO TABS: ORAL_TABLET | ORAL | 2 refills | Status: DC

## 2018-07-23 MED ORDER — ACETAMINOPHEN 325 MG PO TABS
650.0000 mg | ORAL_TABLET | Freq: Once | ORAL | Status: AC
  Administered 2018-07-23: 650 mg via ORAL

## 2018-07-23 MED ORDER — PALONOSETRON HCL INJECTION 0.25 MG/5ML
0.2500 mg | Freq: Once | INTRAVENOUS | Status: AC
  Administered 2018-07-23: 0.25 mg via INTRAVENOUS

## 2018-07-23 MED ORDER — PALONOSETRON HCL INJECTION 0.25 MG/5ML
INTRAVENOUS | Status: AC
  Filled 2018-07-23: qty 5

## 2018-07-23 MED ORDER — DIPHENHYDRAMINE HCL 25 MG PO CAPS
50.0000 mg | ORAL_CAPSULE | Freq: Once | ORAL | Status: AC
  Administered 2018-07-23: 50 mg via ORAL

## 2018-07-23 MED ORDER — TRAMADOL HCL 50 MG PO TABS: 50.0000 mg | ORAL_TABLET | Freq: Two times a day (BID) | ORAL | 0 refills | Status: DC | PRN

## 2018-07-23 NOTE — Patient Instructions (Signed)
CONGRATULATIONS ON YOUR LAST CHEMO!!!!  [facility] Discharge Instructions for Patients Receiving Chemotherapy  Today you received the following chemotherapy agents Taxol, Carbo, Herceptin, Perjeta  To help prevent nausea and vomiting after your treatment, we encourage you to take your nausea medication as prescribed.   If you develop nausea and vomiting that is not controlled by your nausea medication, call the clinic.   BELOW ARE SYMPTOMS THAT SHOULD BE REPORTED IMMEDIATELY:  *FEVER GREATER THAN 100.5 F  *CHILLS WITH OR WITHOUT FEVER  NAUSEA AND VOMITING THAT IS NOT CONTROLLED WITH YOUR NAUSEA MEDICATION  *UNUSUAL SHORTNESS OF BREATH  *UNUSUAL BRUISING OR BLEEDING  TENDERNESS IN MOUTH AND THROAT WITH OR WITHOUT PRESENCE OF ULCERS  *URINARY PROBLEMS  *BOWEL PROBLEMS  UNUSUAL RASH Items with * indicate a potential emergency and should be followed up as soon as possible.  Feel free to call the clinic should you have any questions or concerns. The clinic phone number is [phone].  Please show the [place] at check-in to the Emergency Department and triage nurse.

## 2018-07-23 NOTE — Telephone Encounter (Signed)
No los per 12/27.

## 2018-07-24 ENCOUNTER — Telehealth: Payer: Self-pay | Admitting: Hematology

## 2018-07-24 ENCOUNTER — Encounter: Payer: Self-pay | Admitting: Hematology

## 2018-07-24 ENCOUNTER — Ambulatory Visit (HOSPITAL_COMMUNITY)
Admission: RE | Admit: 2018-07-24 | Discharge: 2018-07-24 | Disposition: A | Discharge status: Home / Self Care | Payer: BLUE CROSS/BLUE SHIELD | Source: Ambulatory Visit | Attending: Hematology | Admitting: Hematology

## 2018-07-24 ENCOUNTER — Inpatient Hospital Stay: Payer: BLUE CROSS/BLUE SHIELD

## 2018-07-24 VITALS — BP 145/75 | HR 92 | Temp 98.2°F | Resp 17

## 2018-07-24 DIAGNOSIS — Z170 Estrogen receptor positive status [ER+]: Secondary | ICD-10-CM

## 2018-07-24 DIAGNOSIS — Z5111 Encounter for antineoplastic chemotherapy: Secondary | ICD-10-CM | POA: Diagnosis not present

## 2018-07-24 DIAGNOSIS — L609 Nail disorder, unspecified: Secondary | ICD-10-CM | POA: Diagnosis not present

## 2018-07-24 DIAGNOSIS — F419 Anxiety disorder, unspecified: Secondary | ICD-10-CM | POA: Diagnosis not present

## 2018-07-24 DIAGNOSIS — C773 Secondary and unspecified malignant neoplasm of axilla and upper limb lymph nodes: Secondary | ICD-10-CM | POA: Diagnosis not present

## 2018-07-24 DIAGNOSIS — Z5189 Encounter for other specified aftercare: Secondary | ICD-10-CM | POA: Diagnosis not present

## 2018-07-24 DIAGNOSIS — M791 Myalgia, unspecified site: Secondary | ICD-10-CM | POA: Diagnosis not present

## 2018-07-24 DIAGNOSIS — N6489 Other specified disorders of breast: Secondary | ICD-10-CM | POA: Diagnosis not present

## 2018-07-24 DIAGNOSIS — C50211 Malignant neoplasm of upper-inner quadrant of right female breast: Secondary | ICD-10-CM | POA: Diagnosis not present

## 2018-07-24 DIAGNOSIS — Z5112 Encounter for antineoplastic immunotherapy: Secondary | ICD-10-CM | POA: Diagnosis not present

## 2018-07-24 LAB — CANCER ANTIGEN 27.29: CAN 27.29: 34.9 U/mL (ref 0.0–38.6)

## 2018-07-24 MED ORDER — PEGFILGRASTIM-CBQV 6 MG/0.6ML ~~LOC~~ SOSY
6.0000 mg | PREFILLED_SYRINGE | Freq: Once | SUBCUTANEOUS | Status: AC
  Administered 2018-07-24: 6 mg via SUBCUTANEOUS

## 2018-07-24 MED ORDER — PEGFILGRASTIM-CBQV 6 MG/0.6ML ~~LOC~~ SOSY
PREFILLED_SYRINGE | SUBCUTANEOUS | Status: AC
  Filled 2018-07-24: qty 0.6

## 2018-07-24 MED ORDER — GADOBUTROL 1 MMOL/ML IV SOLN
7.5000 mL | Freq: Once | INTRAVENOUS | Status: AC | PRN
  Administered 2018-07-24: 7 mL via INTRAVENOUS

## 2018-07-24 NOTE — Patient Instructions (Signed)
Pegfilgrastim injection  What is this medicine?  PEGFILGRASTIM (PEG fil gra stim) is a long-acting granulocyte colony-stimulating factor that stimulates the growth of neutrophils, a type of white blood cell important in the body's fight against infection. It is used to reduce the incidence of fever and infection in patients with certain types of cancer who are receiving chemotherapy that affects the bone marrow, and to increase survival after being exposed to high doses of radiation.  This medicine may be used for other purposes; ask your health care provider or pharmacist if you have questions.  COMMON BRAND NAME(S): Fulphila, Neulasta, UDENYCA  What should I tell my health care provider before I take this medicine?  They need to know if you have any of these conditions:  -kidney disease  -latex allergy  -ongoing radiation therapy  -sickle cell disease  -skin reactions to acrylic adhesives (On-Body Injector only)  -an unusual or allergic reaction to pegfilgrastim, filgrastim, other medicines, foods, dyes, or preservatives  -pregnant or trying to get pregnant  -breast-feeding  How should I use this medicine?  This medicine is for injection under the skin. If you get this medicine at home, you will be taught how to prepare and give the pre-filled syringe or how to use the On-body Injector. Refer to the patient Instructions for Use for detailed instructions. Use exactly as directed. Tell your healthcare provider immediately if you suspect that the On-body Injector may not have performed as intended or if you suspect the use of the On-body Injector resulted in a missed or partial dose.  It is important that you put your used needles and syringes in a special sharps container. Do not put them in a trash can. If you do not have a sharps container, call your pharmacist or healthcare provider to get one.  Talk to your pediatrician regarding the use of this medicine in children. While this drug may be prescribed for  selected conditions, precautions do apply.  Overdosage: If you think you have taken too much of this medicine contact a poison control center or emergency room at once.  NOTE: This medicine is only for you. Do not share this medicine with others.  What if I miss a dose?  It is important not to miss your dose. Call your doctor or health care professional if you miss your dose. If you miss a dose due to an On-body Injector failure or leakage, a new dose should be administered as soon as possible using a single prefilled syringe for manual use.  What may interact with this medicine?  Interactions have not been studied.  Give your health care provider a list of all the medicines, herbs, non-prescription drugs, or dietary supplements you use. Also tell them if you smoke, drink alcohol, or use illegal drugs. Some items may interact with your medicine.  This list may not describe all possible interactions. Give your health care provider a list of all the medicines, herbs, non-prescription drugs, or dietary supplements you use. Also tell them if you smoke, drink alcohol, or use illegal drugs. Some items may interact with your medicine.  What should I watch for while using this medicine?  You may need blood work done while you are taking this medicine.  If you are going to need a MRI, CT scan, or other procedure, tell your doctor that you are using this medicine (On-Body Injector only).  What side effects may I notice from receiving this medicine?  Side effects that you should report to   your doctor or health care professional as soon as possible:  -allergic reactions like skin rash, itching or hives, swelling of the face, lips, or tongue  -back pain  -dizziness  -fever  -pain, redness, or irritation at site where injected  -pinpoint red spots on the skin  -red or dark-brown urine  -shortness of breath or breathing problems  -stomach or side pain, or pain at the shoulder  -swelling  -tiredness  -trouble passing urine or  change in the amount of urine  Side effects that usually do not require medical attention (report to your doctor or health care professional if they continue or are bothersome):  -bone pain  -muscle pain  This list may not describe all possible side effects. Call your doctor for medical advice about side effects. You may report side effects to FDA at 1-800-FDA-1088.  Where should I keep my medicine?  Keep out of the reach of children.  If you are using this medicine at home, you will be instructed on how to store it. Throw away any unused medicine after the expiration date on the label.  NOTE: This sheet is a summary. It may not cover all possible information. If you have questions about this medicine, talk to your doctor, pharmacist, or health care provider.   2019 Elsevier/Gold Standard (2017-10-19 16:57:08)

## 2018-07-24 NOTE — Telephone Encounter (Signed)
I called patient and left a message on her cell phone.  Her tonsil swab was negative for strep, given no fever, no leukocytosis, I will hold on antibiotics for now.  This is likely viral infection.  I told her to call us back if she spikes fever or symptoms getting worse.  [name]  07/24/2018

## 2018-07-26 ENCOUNTER — Other Ambulatory Visit: Payer: Self-pay

## 2018-07-26 ENCOUNTER — Telehealth: Payer: Self-pay

## 2018-07-26 DIAGNOSIS — J029 Acute pharyngitis, unspecified: Secondary | ICD-10-CM

## 2018-07-26 MED ORDER — AMOXICILLIN-POT CLAVULANATE 875-125 MG PO TABS: 1.0000 | ORAL_TABLET | Freq: Two times a day (BID) | ORAL | 0 refills | Status: DC

## 2018-07-26 NOTE — Telephone Encounter (Signed)
Spoke with patient informed her per Dr. Burr Medico will call in Augmentin 875 mg take one twice a day for 7 days,  If no better in 2 days call back and we will have her seen [facility], patient verbalized an understanding. Script sent in.

## 2018-07-26 NOTE — Telephone Encounter (Signed)
Received after hours message patient complaint of fever, cough and aching, Spoke with patient she did not go to ED or urgent care as recommended.  Fever this morning was 99.8 none in the last few hours.  Has been taking Motrin for fever and body aches.  Coughing taking Robitussin.  Denies sore throat.

## 2018-07-26 NOTE — Progress Notes (Signed)
Aug

## 2018-07-29 ENCOUNTER — Ambulatory Visit (HOSPITAL_COMMUNITY)
Admission: RE | Admit: 2018-07-29 | Discharge: 2018-07-29 | Disposition: A | Discharge status: Home / Self Care | Payer: BLUE CROSS/BLUE SHIELD | Source: Ambulatory Visit | Attending: Medical | Admitting: Medical

## 2018-07-29 ENCOUNTER — Other Ambulatory Visit: Payer: Self-pay | Admitting: Medical

## 2018-07-29 ENCOUNTER — Telehealth: Payer: Self-pay

## 2018-07-29 DIAGNOSIS — R05 Cough: Secondary | ICD-10-CM

## 2018-07-29 DIAGNOSIS — R059 Cough, unspecified: Secondary | ICD-10-CM

## 2018-07-29 MED ORDER — LEVOFLOXACIN 500 MG PO TABS: 500.0000 mg | ORAL_TABLET | Freq: Every day | ORAL | 0 refills | Status: DC

## 2018-07-29 MED ORDER — HYDROCODONE-HOMATROPINE 5-1.5 MG/5ML PO SYRP: 5.0000 mL | ORAL_SOLUTION | Freq: Four times a day (QID) | ORAL | 0 refills | Status: DC | PRN

## 2018-07-29 NOTE — Progress Notes (Signed)
These results were called to [name] and were reviewed with her . Her questions were answered. She expressed understanding.  Her antibiotic was changed from Augmentin to Levaquin.  Additionally she was given a prescription for Hycodan.  Lucianne Lei

## 2018-07-29 NOTE — Telephone Encounter (Signed)
Spoke to patient to let her know per Sandi Mealy PA that chest x-ray shows pneumonia, he will send in something for her cough and Levaquin to there pharmacy as she feels like the Augmentin has not helped.  She verbalized an understanding.

## 2018-07-29 NOTE — Telephone Encounter (Signed)
Patient calls with continued complaint of cough, low grade fever, still taking Augmentin, consulted with Sandi Mealy PA will order Chest X-ray and call the patient with the results.  Patient verbalized an understanding.

## 2018-07-30 ENCOUNTER — Telehealth: Payer: Self-pay | Admitting: Hematology

## 2018-07-30 NOTE — Telephone Encounter (Signed)
Spoke with patient re 1/17 appointments

## 2018-08-03 ENCOUNTER — Telehealth: Payer: Self-pay | Admitting: *Deleted

## 2018-08-03 NOTE — Telephone Encounter (Signed)
Notified that it does take time for cough to resolve. To call us if she gets worse or has fever.

## 2018-08-03 NOTE — Telephone Encounter (Signed)
Pt left a message stating she is still "having trouble, cough with deep breath. When I use incentive spirometer, I can only get it 1/2 of the way up"   1 antibiotic left, wants to know if it is just going to take time to feel better.

## 2018-08-05 ENCOUNTER — Other Ambulatory Visit: Payer: Self-pay | Admitting: General Surgery

## 2018-08-05 DIAGNOSIS — C50211 Malignant neoplasm of upper-inner quadrant of right female breast: Secondary | ICD-10-CM | POA: Diagnosis not present

## 2018-08-05 DIAGNOSIS — C50919 Malignant neoplasm of unspecified site of unspecified female breast: Secondary | ICD-10-CM | POA: Diagnosis not present

## 2018-08-05 DIAGNOSIS — C773 Secondary and unspecified malignant neoplasm of axilla and upper limb lymph nodes: Secondary | ICD-10-CM | POA: Diagnosis not present

## 2018-08-05 DIAGNOSIS — Z170 Estrogen receptor positive status [ER+]: Secondary | ICD-10-CM

## 2018-08-05 DIAGNOSIS — Z800 Family history of malignant neoplasm of digestive organs: Secondary | ICD-10-CM | POA: Diagnosis not present

## 2018-08-10 ENCOUNTER — Other Ambulatory Visit: Payer: Self-pay | Admitting: Hematology

## 2018-08-10 DIAGNOSIS — C50211 Malignant neoplasm of upper-inner quadrant of right female breast: Secondary | ICD-10-CM

## 2018-08-10 DIAGNOSIS — Z170 Estrogen receptor positive status [ER+]: Secondary | ICD-10-CM

## 2018-08-11 ENCOUNTER — Other Ambulatory Visit: Payer: Self-pay | Admitting: Hematology

## 2018-08-11 ENCOUNTER — Other Ambulatory Visit: Payer: Self-pay | Admitting: General Surgery

## 2018-08-11 DIAGNOSIS — C50211 Malignant neoplasm of upper-inner quadrant of right female breast: Secondary | ICD-10-CM

## 2018-08-11 DIAGNOSIS — Z170 Estrogen receptor positive status [ER+]: Secondary | ICD-10-CM

## 2018-08-11 NOTE — Progress Notes (Signed)
[facility]   [phone]) [phone] [phone]   Clinic Follow up Note   Patient Care Team: Lujean Amel, MD as PCP - General (Family Medicine) Fanny Skates, MD as Consulting Physician (General Surgery) Truitt Merle, MD as Consulting Physician (Hematology) Eppie Gibson, MD as Attending Physician (Radiation Oncology)  Date of Service:  08/13/2018  CHIEF COMPLAINT: F/u for right breast cancer  SUMMARY OF ONCOLOGIC HISTORY: Oncology History   Cancer Staging Malignant neoplasm of upper-inner quadrant of right breast in female, estrogen receptor positive ([place]) Staging form: Breast, AJCC 8th Edition - Clinical stage from 03/08/2018: Stage IB (cT2, cN1, cM0, G3, ER+, PR+, HER2+) - Signed by Truitt Merle, MD on 03/16/2018       Malignant neoplasm of upper-inner quadrant of right breast in female, estrogen receptor positive ([place])   02/24/2018 Mammogram    screening mammogram on 02/24/2018 that was benign    03/04/2018 Mammogram    diagnostic mammogram on 03/04/2018 due to a palpable mass on her right breast. Diagnostic mammogram, breast US revealed and biopsy revealed suspicious mass in the right breast at 2:30 at the palpable site of concern and one suspicious right axillary LN.    03/08/2018 Cancer Staging    Staging form: Breast, AJCC 8th Edition - Clinical stage from 03/08/2018: Stage IB (cT2, cN1, cM0, G3, ER+, PR+, HER2+) - Signed by Truitt Merle, MD on 03/16/2018    03/08/2018 Receptors her2    Her2 positive, ER 90% PR 5% and Ki67 60%    03/08/2018 Pathology Results    Diagnosis 1. Breast, right, needle core biopsy, 2:30 - INVASIVE DUCTAL CARCINOMA, GRADE 3. - LYMPHOVASCULAR SPACE INVOLVEMENT BY TUMOR. 2. Lymph node, needle/core biopsy, right axilla - METASTATIC CARCINOMA INVOLVING SCANT LYMPHOID TISSUE.    03/10/2018 Initial Diagnosis    Malignant neoplasm of upper-inner quadrant of right breast in female, estrogen receptor positive ([place])    03/20/2018 Imaging      MRI brain 03/20/18 IMPRESSION: No evidence of metastatic disease.  Normal appearance of brain. Slightly heterogeneous marrow pattern of the upper cervical spine, nonspecific but likely benign, especially in the setting of early stage disease.     03/24/2018 Echocardiogram    Baseline ECHO 03/24/18 Study Conclusions - Left ventricle: The cavity size was normal. Wall thickness was   increased in a pattern of mild LVH. Systolic function was normal.   The estimated ejection fraction was in the range of 60% to 65%.   Wall motion was normal; there were no regional wall motion   abnormalities. Doppler parameters are consistent with abnormal   left ventricular relaxation (grade 1 diastolic dysfunction). - Right ventricle: The cavity size was mildly dilated. Wall   thickness was normal.    03/25/2018 Imaging    MRI Breast B/l 03/25/18 IMPRESSION: 1. The patient's primary malignancy in the posterior right breast measures 3.2 x 2 x 2.6 cm with apparent invasion of the underlying pectoralis muscle. Numerous surrounding abnormal enhancing masses, consistent with satellite lesions, extend from 11 o'clock in the right breast into the inferolateral right breast with a total span of suspected disease measuring 4.5 x 4.6 x 6.4 cm in AP, transverse, and craniocaudal dimension. The suspected extent of disease involves the superior and inferolateral quadrants. The primary malignancy also extends just across midline into the superior medial quadrant. 2. No MRI evidence of malignancy in the left breast. 3. Known metastatic noted in the right axilla.    03/25/2018 Imaging    Whole body bone scan  03/25/18 IMPRESSION: No scintigraphic evidence skeletal metastasis.    03/25/2018 Imaging    CT CAP W Contrast 03/25/18 IMPRESSION: 1. Mass within the deep aspect of the right breast along the pectoralis muscle compatible with primary breast malignancy. 2. There is suggestion of two additional possible masses  within the lateral aspect of the right breast versus nodular appearing breast tissue. Recommend dedicated evaluation with bilateral breast MRI. 3. Mildly thickened right axillary lymph node compatible with metastatic adenopathy, recently biopsied. 4. Multiple bilateral pulmonary nodules are indeterminate, potentially sequelae of prior infectious/inflammatory process. Recommend attention on follow-up. Consider follow-up CT in 6 months. 5. Portal venous gas is demonstrated within the left hepatic lobe. Additionally, there is wall thickening of the cecum and ascending colon with small amount of gas within the pericolonic vasculature. Constellation of findings is indeterminate in etiology however may be secondary to colitis at this location with considerations including infectious, inflammatory or ischemic etiologies. Correlate for recent procedure. If patient is not up-to-date for colonic screening, recommend further evaluation with colonoscopy to exclude the possibility of colonic mass within the cecum/ascending colon. 6. These results were called by telephone at the time of interpretation on 03/25/2018 at 9:31 am to Dr. Truitt Merle , who verbally acknowledged these results.    03/30/2018 -  Chemotherapy    Neoadjuvant TCHP every 3 weeks starting 03/30/18. Completed 6 cycles of TCHP on 07/23/18.  Continued with maintenance Herceptin/Perjeta q3weeks starting 08/13/17 to complete 1 year.     04/09/2018 Genetic Testing    Negative genetic testing on the [facility] Common Hereditary Cancers Panel. The Common Hereditary Cancers Panel offered by [facility] includes sequencing and/or deletion duplication testing of the following 47 genes: APC, ATM, AXIN2, BARD1, BMPR1A, BRCA1, BRCA2, BRIP1, CDH1, CDKN2A (p14ARF), CDKN2A (p16INK4a), CKD4, CHEK2, CTNNA1, DICER1, EPCAM (Deletion/duplication testing only), GREM1 (promoter region deletion/duplication testing only), KIT, MEN1, MLH1, MSH2, MSH3, MSH6, MUTYH, NBN, NF1,  NHTL1, PALB2, PDGFRA, PMS2, POLD1, POLE, PTEN, RAD50, RAD51C, RAD51D, SDHB, SDHC, SDHD, SMAD4, SMARCA4. STK11, TP53, TSC1, TSC2, and VHL.  The following genes were evaluated for sequence changes only: SDHA and HOXB13 c.251G>A variant only.   Genetic testing did detect a Variant of Unknown Significance (VUS) in the POLD1 gene called c.985C>T (p.Pro329Ser). At this time, it is unknown if this variant is associated with increased cancer risk or if this is a normal finding, but most variants such as this get reclassified to being inconsequential. It should not be used to make medical management decisions.   The report date is 04/09/2018.    07/24/2018 Breast MRI    IMPRESSION: 1. Resolution of the enhancing mass (known cancer) and small satellite lesions previously seen in the right breast following chemotherapy.  2. The metastatic lymph node in the right axilla has decreased in size.  3.  No MRI evidence of left breast malignancy.      CURRENT THERAPY: Maintenance Herceptin/Perjeta q3weeks to complete 1 year treatment  INTERVAL HISTORY:  Claire Guzman is here for a follow up post neoaduvant chemo. She presents to the clinic today by herself.  She tolerated her last cycle neoadjuvant chemotherapy moderately well overall, still has some sinus drainage, no fever or cough.  Her fatigue and appetite issue improved.  She still quite stressed due to her recent loss of job and financial stress.  She is scheduled for breast surgery on 2/18.   REVIEW OF SYSTEMS:   Constitutional: Denies fevers, chills or abnormal weight loss, (+) fatigue  Eyes: Denies blurriness of  vision Ears, nose, mouth, throat, and face: Denies mucositis or sore throat, (+) sinus drainage  Respiratory: Denies cough, dyspnea or wheezes Cardiovascular: Denies palpitation, chest discomfort or lower extremity swelling Gastrointestinal:  Denies nausea, heartburn or change in bowel habits Skin: Denies abnormal skin  rashes Lymphatics: Denies new lymphadenopathy or easy bruising Neurological:Denies numbness, tingling or new weaknesses Behavioral/Psych: Mood is stable, no new changes  All other systems were reviewed with the patient and are negative.  MEDICAL HISTORY:  Past Medical History:  Diagnosis Date  . Abdominal pain, unspecified site   . Anxiety state, unspecified   . Cancer [facility])    R breast cancer  . Contact dermatitis and other eczema, due to unspecified cause   . Essential hypertension, benign   . Family history of colon cancer   . Family history of melanoma   . Family history of prostate cancer   . GERD (gastroesophageal reflux disease)   . Hematuria, unspecified   . Hypertension   . Lower back pain   . Mixed hyperlipidemia   . Rosacea   . Unspecified symptom associated with female genital organs     SURGICAL HISTORY: Past Surgical History:  Procedure Laterality Date  . CESAREAN SECTION    . DIAGNOSTIC LAPAROSCOPY    . LAPAROSCOPIC APPENDECTOMY    . PORTACATH PLACEMENT Left 04/13/2018   Procedure: INSERTION PORT-A-CATH WITH [place];  Surgeon: Fanny Skates, MD;  Location: [facility];  Service: General;  Laterality: Left;  . UTERINE FIBROID SURGERY      I have reviewed the social history and family history with the patient and they are unchanged from previous note.  ALLERGIES:  is allergic to metrogel [metronidazole]; other; paxil [paroxetine hcl]; prednisone; tramadol; and zomig [zolmitriptan].  MEDICATIONS:  Current Outpatient Medications  Medication Sig Dispense Refill  . ALPRAZolam (XANAX) 0.5 MG tablet Take 1 tablet (0.5 mg total) by mouth at bedtime as needed for anxiety. 30 tablet 0  . atenolol (TENORMIN) 50 MG tablet Take 50 mg by mouth at bedtime.    . Baclofen 5 MG TABS TAKE 1 TO 2 TABLETS BY MOUTH EVERY 8 HOURS AS NEEDED FOR EYE LID SPASMS 30 tablet 0  . cyclobenzaprine (FLEXERIL) 5 MG tablet Take 1 tablet (5 mg total) by mouth 3 (three) times daily  as needed for muscle spasms. 30 tablet 0  . furosemide (LASIX) 20 MG tablet Take 1 tablet (20 mg total) by mouth daily. 20 tablet 1  . HYDROcodone-acetaminophen (NORCO) 5-325 MG tablet Take 1-2 tablets by mouth every 6 (six) hours as needed for moderate pain or severe pain. 20 tablet 0  . HYDROcodone-homatropine (HYCODAN) 5-1.5 MG/5ML syrup Take 5 mLs by mouth every 6 (six) hours as needed for cough. 120 mL 0  . levofloxacin (LEVAQUIN) 500 MG tablet Take 1 tablet (500 mg total) by mouth daily. 7 tablet 0  . mupirocin ointment (BACTROBAN) 2 % Apply to affected area 3 times daily until better 30 g 1  . omeprazole (PRILOSEC) 20 MG capsule Take 1 capsule (20 mg total) by mouth 2 (two) times daily before a meal. 60 capsule 2  . potassium chloride SA (K-DUR,KLOR-CON) 20 MEQ tablet TAKE 1 TABLET(20 MEQ) BY MOUTH TWICE DAILY 30 tablet 0  . traMADol (ULTRAM) 50 MG tablet Take 1 tablet (50 mg total) by mouth every 12 (twelve) hours as needed. 30 tablet 0  . triamcinolone ointment (KENALOG) 0.5 % Apply 1 application topically 2 (two) times daily. 30 g 1  .  venlafaxine (EFFEXOR) 37.5 MG tablet Take 1 tablet (37.5 mg total) by mouth 2 (two) times daily. 30 tablet 3   No current facility-administered medications for this visit.     PHYSICAL EXAMINATION: ECOG PERFORMANCE STATUS: 1 - Symptomatic but completely ambulatory  Vitals:   08/13/18 0946  BP: 120/72  Pulse: 99  Resp: 18  Temp: 98.3 F (36.8 C)  SpO2: 99%   Filed Weights   08/13/18 0946  Weight: 152 lb 3.2 oz (69 kg)    GENERAL:alert, no distress and comfortable SKIN: skin color, texture, turgor are normal, no rashes or significant lesions EYES: normal, Conjunctiva are pink and non-injected, sclera clear OROPHARYNX:no exudate, no erythema and lips, buccal mucosa, and tongue normal  NECK: supple, thyroid normal size, non-tender, without nodularity LYMPH:  no palpable lymphadenopathy in the cervical, axillary or inguinal LUNGS: clear to  auscultation and percussion with normal breathing effort HEART: regular rate & rhythm and no murmurs and no lower extremity edema ABDOMEN:abdomen soft, non-tender and normal bowel sounds Musculoskeletal:no cyanosis of digits and no clubbing  NEURO: alert & oriented x 3 with fluent speech, no focal motor/sensory deficits  LABORATORY DATA:  I have reviewed the data as listed CBC Latest Ref Rng & Units 08/13/2018 07/23/2018 06/23/2018  WBC 4.0 - 10.5 K/uL 3.6(L) 6.3 4.4  Hemoglobin 12.0 - 15.0 g/dL 11.6(L) 11.4(L) 10.3(L)  Hematocrit 36.0 - 46.0 % 32.7(L) 33.5(L) 30.6(L)  Platelets 150 - 400 K/uL 129(L) 132(L) 172     CMP Latest Ref Rng & Units 08/13/2018 07/23/2018 06/23/2018  Glucose 70 - 99 mg/dL 98 107(H) 108(H)  BUN 6 - 20 mg/dL 7 10 10   Creatinine 0.44 - 1.00 mg/dL 0.58 0.65 0.61  Sodium 135 - 145 mmol/L 143 142 143  Potassium 3.5 - 5.1 mmol/L 3.2(L) 2.9(LL) 3.3(L)  Chloride 98 - 111 mmol/L 106 107 107  CO2 22 - 32 mmol/L 27 26 26   Calcium 8.9 - 10.3 mg/dL 9.3 9.2 8.9  Total Protein 6.5 - 8.1 g/dL 7.1 6.6 5.9(L)  Total Bilirubin 0.3 - 1.2 mg/dL 0.4 0.7 0.4  Alkaline Phos 38 - 126 U/L 90 102 90  AST 15 - 41 U/L 20 18 19   ALT 0 - 44 U/L 13 14 12       RADIOGRAPHIC STUDIES: I have personally reviewed the radiological images as listed and agreed with the findings in the report. No results found.   ASSESSMENT & PLAN:  Claire Guzman is a 53 y.o. female with   1.Malignant neoplasm of upper-inner quadrant of right breast in female, invasive ductal carcinoma, cT2N1M0 stage IB, ER+/PR+/HER2+, G3  -She was diagnosed in 02/2018. Given the size of her mass and LN involvement, which predicts high risk of recurrence. She completed neoadjuvant chemo of 6 cycles TCHP.  -We discussed her MRI Breast from 07/24/18 which shows excellent response.  She will proceed with breast surgery on September 14, 2018. -We will continue maintenance Herceptin and Perjeta, plan for a total of 1 year  therapy.  It depends on her surgical pathology findings, if she has significant residual disease, I would switch Herceptin and Perjeta to Kadcyla as adjuvant therapy. -She would benefit from adjuvant radiation. ---Giving her strongly ER and PR positivity of the tumor cells, and her pre-menopause status, I recommend adjuvant endocrine therapy with tamoxifen. The potential side effects, which includes but not limited to, hot flash, skin and vaginal dryness, slightly increased risk of cardiovascular disease and cataract, small risk of thrombosis and endometrial cancer, were  discussed with her in great details. Preventive strategies for thrombosis, such as being physically active, using compression stocks, avoid cigarette smoking, etc., were reviewed with her. I also recommend her to follow-up with her gynecologist once a year, and watch for vaginal spotting or bleeding, as a clinically sign of endometrial cancer, etc. She voiced good understanding, and agrees to proceed. Will start after her surgery or when she completes radiation -Alternative adjuvant endocrine therapy, such as aromatase inhibitor and overexpression were discussed with her also. However she is 32, likely close to menopause, I do not strongly feel this is necessary. -We also reviewed the breast cancer surveillance, including annual mammogram, and physical exam.  -Lab reviewed, adequate for treatment, she will proceed Herceptin Perjeta today, and continue every 3 weeks. -I will see her back in 6 weeks, and to review her surgical pathology.   2. Genetics showed no pathogenic mutations.   3. Anxiety  -She has history of anxiety, currently on Xanax as needed -Stable   4. HTN -She will continue atenolol.  -BP is well controlled   5. Bilateral small lung nodules -Her staging CT scan showed bilateral multiple small lung nodules, 3 to 4 mm, indeterminate, likely benign. She is a never smoker -Plan to monitor repeat CT chest without  contrast in 3-6 months   6. Hand and leg edema -Mild, and symmetric, likely related to steroids and chemo, unlikely DVT. -She tried Lasix 20 mg dailyto twice daily,did not help her much. -I encouraged her to use compression stockingsduring the day -overall improved  7. Sinus infection vs allergy  -she has been having sinus congestion for the past 3 weeks, with most clear discharge, likely allergy -continue allergy meds     Plan: -Lab reviewed, will proceed with Herceptin and Herceptin today, and continue every 3 weeks -f/u in 6 weeks -breast surgery on 2/18    No problem-specific Assessment & Plan notes found for this encounter.   No orders of the defined types were placed in this encounter.  All questions were answered. The patient knows to call the clinic with any problems, questions or concerns. No barriers to learning was detected. I spent 20 minutes counseling the patient face to face. The total time spent in the appointment was 25 minutes and more than 50% was on counseling and review of test results     Truitt Merle, MD 08/13/2018   I, Joslyn Devon, am acting as scribe for Truitt Merle, MD.   I have reviewed the above documentation for accuracy and completeness, and I agree with the above.

## 2018-08-13 ENCOUNTER — Inpatient Hospital Stay: Payer: BLUE CROSS/BLUE SHIELD

## 2018-08-13 ENCOUNTER — Encounter: Payer: Self-pay | Admitting: Hematology

## 2018-08-13 ENCOUNTER — Inpatient Hospital Stay: Payer: BLUE CROSS/BLUE SHIELD | Attending: Hematology | Admitting: Hematology

## 2018-08-13 VITALS — BP 120/72 | HR 99 | Temp 98.3°F | Resp 18 | Ht 64.0 in | Wt 152.2 lb

## 2018-08-13 DIAGNOSIS — Z5112 Encounter for antineoplastic immunotherapy: Secondary | ICD-10-CM | POA: Insufficient documentation

## 2018-08-13 DIAGNOSIS — C50211 Malignant neoplasm of upper-inner quadrant of right female breast: Secondary | ICD-10-CM

## 2018-08-13 DIAGNOSIS — F419 Anxiety disorder, unspecified: Secondary | ICD-10-CM

## 2018-08-13 DIAGNOSIS — I10 Essential (primary) hypertension: Secondary | ICD-10-CM

## 2018-08-13 DIAGNOSIS — Z170 Estrogen receptor positive status [ER+]: Secondary | ICD-10-CM

## 2018-08-13 DIAGNOSIS — R911 Solitary pulmonary nodule: Secondary | ICD-10-CM | POA: Diagnosis not present

## 2018-08-13 DIAGNOSIS — R600 Localized edema: Secondary | ICD-10-CM

## 2018-08-13 DIAGNOSIS — Z95828 Presence of other vascular implants and grafts: Secondary | ICD-10-CM

## 2018-08-13 DIAGNOSIS — J3489 Other specified disorders of nose and nasal sinuses: Secondary | ICD-10-CM | POA: Diagnosis not present

## 2018-08-13 DIAGNOSIS — Z808 Family history of malignant neoplasm of other organs or systems: Secondary | ICD-10-CM

## 2018-08-13 DIAGNOSIS — Z800 Family history of malignant neoplasm of digestive organs: Secondary | ICD-10-CM

## 2018-08-13 DIAGNOSIS — Z5111 Encounter for antineoplastic chemotherapy: Secondary | ICD-10-CM | POA: Insufficient documentation

## 2018-08-13 DIAGNOSIS — Z8042 Family history of malignant neoplasm of prostate: Secondary | ICD-10-CM

## 2018-08-13 LAB — CMP (CANCER CENTER ONLY)
ALT: 13 U/L (ref 0–44)
AST: 20 U/L (ref 15–41)
Albumin: 3.8 g/dL (ref 3.5–5.0)
Alkaline Phosphatase: 90 U/L (ref 38–126)
Anion gap: 10 (ref 5–15)
BUN: 7 mg/dL (ref 6–20)
CHLORIDE: 106 mmol/L (ref 98–111)
CO2: 27 mmol/L (ref 22–32)
Calcium: 9.3 mg/dL (ref 8.9–10.3)
Creatinine: 0.58 mg/dL (ref 0.44–1.00)
GFR, Est AFR Am: >60 mL/min (ref >60)
Glucose, Bld: 98 mg/dL (ref 70–99)
Potassium: 3.2 mmol/L — ABNORMAL LOW (ref 3.5–5.1)
Sodium: 143 mmol/L (ref 135–145)
Total Bilirubin: 0.4 mg/dL (ref 0.3–1.2)
Total Protein: 7.1 g/dL (ref 6.5–8.1)

## 2018-08-13 LAB — CBC WITH DIFFERENTIAL (CANCER CENTER ONLY)
Abs Immature Granulocytes: 0.01 10*3/uL (ref 0.00–0.07)
Basophils Absolute: 0 10*3/uL (ref 0.0–0.1)
Basophils Relative: 1 %
Eosinophils Absolute: 0 10*3/uL (ref 0.0–0.5)
Eosinophils Relative: 0 %
HCT: 32.7 % — ABNORMAL LOW (ref 36.0–46.0)
Hemoglobin: 11.6 g/dL — ABNORMAL LOW (ref 12.0–15.0)
Immature Granulocytes: 0 %
Lymphocytes Relative: 33 %
Lymphs Abs: 1.2 10*3/uL (ref 0.7–4.0)
MCH: 35.2 pg — AB (ref 26.0–34.0)
MCHC: 35.5 g/dL (ref 30.0–36.0)
MCV: 99.1 fL (ref 80.0–100.0)
MONO ABS: 0.4 10*3/uL (ref 0.1–1.0)
Monocytes Relative: 10 %
Neutro Abs: 2 10*3/uL (ref 1.7–7.7)
Neutrophils Relative %: 56 %
Platelet Count: 129 10*3/uL — ABNORMAL LOW (ref 150–400)
RBC: 3.3 MIL/uL — AB (ref 3.87–5.11)
RDW: 12.8 % (ref 11.5–15.5)
WBC Count: 3.6 10*3/uL — ABNORMAL LOW (ref 4.0–10.5)
nRBC: 0 % (ref 0.0–0.2)

## 2018-08-13 MED ORDER — HEPARIN SOD (PORK) LOCK FLUSH 100 UNIT/ML IV SOLN
500.0000 [IU] | Freq: Once | INTRAVENOUS | Status: AC | PRN
  Administered 2018-08-13: 500 [IU]
  Filled 2018-08-13: qty 5

## 2018-08-13 MED ORDER — DIPHENHYDRAMINE HCL 25 MG PO CAPS
ORAL_CAPSULE | ORAL | Status: AC
  Filled 2018-08-13: qty 2

## 2018-08-13 MED ORDER — ACETAMINOPHEN 325 MG PO TABS
ORAL_TABLET | ORAL | Status: AC
  Filled 2018-08-13: qty 2

## 2018-08-13 MED ORDER — ALPRAZOLAM 0.5 MG PO TABS: 0.5000 mg | ORAL_TABLET | Freq: Every evening | ORAL | 0 refills | Status: DC | PRN

## 2018-08-13 MED ORDER — SODIUM CHLORIDE 0.9% FLUSH
10.0000 mL | INTRAVENOUS | Status: DC | PRN
  Administered 2018-08-13: 10 mL
  Filled 2018-08-13: qty 10

## 2018-08-13 MED ORDER — DIPHENHYDRAMINE HCL 25 MG PO CAPS
50.0000 mg | ORAL_CAPSULE | Freq: Once | ORAL | Status: AC
  Administered 2018-08-13: 50 mg via ORAL

## 2018-08-13 MED ORDER — ACETAMINOPHEN 325 MG PO TABS
650.0000 mg | ORAL_TABLET | Freq: Once | ORAL | Status: AC
  Administered 2018-08-13: 650 mg via ORAL

## 2018-08-13 MED ORDER — SODIUM CHLORIDE 0.9 % IV SOLN
420.0000 mg | Freq: Once | INTRAVENOUS | Status: AC
  Administered 2018-08-13: 420 mg via INTRAVENOUS
  Filled 2018-08-13: qty 14

## 2018-08-13 MED ORDER — SODIUM CHLORIDE 0.9 % IV SOLN
Freq: Once | INTRAVENOUS | Status: AC
  Administered 2018-08-13: 11:00:00 via INTRAVENOUS
  Filled 2018-08-13: qty 250

## 2018-08-13 MED ORDER — TRASTUZUMAB CHEMO 150 MG IV SOLR
450.0000 mg | Freq: Once | INTRAVENOUS | Status: AC
  Administered 2018-08-13: 450 mg via INTRAVENOUS
  Filled 2018-08-13: qty 21.43

## 2018-08-13 MED ORDER — VENLAFAXINE HCL 37.5 MG PO TABS: 37.5000 mg | ORAL_TABLET | Freq: Two times a day (BID) | ORAL | 3 refills | Status: DC

## 2018-08-13 NOTE — Progress Notes (Signed)
Per Dr. Burr Medico okay for patient to received treatment today with echocardiogram scheduled for next week.

## 2018-08-13 NOTE — Patient Instructions (Signed)
[facility] Discharge Instructions for Patients Receiving Chemotherapy  Today you received the following chemotherapy agents :  Herceptin, Perjeta.  To help prevent nausea and vomiting after your treatment, we encourage you to take your nausea medication as prescribed.   If you develop nausea and vomiting that is not controlled by your nausea medication, call the clinic.   BELOW ARE SYMPTOMS THAT SHOULD BE REPORTED IMMEDIATELY:  *FEVER GREATER THAN 100.5 F  *CHILLS WITH OR WITHOUT FEVER  NAUSEA AND VOMITING THAT IS NOT CONTROLLED WITH YOUR NAUSEA MEDICATION  *UNUSUAL SHORTNESS OF BREATH  *UNUSUAL BRUISING OR BLEEDING  TENDERNESS IN MOUTH AND THROAT WITH OR WITHOUT PRESENCE OF ULCERS  *URINARY PROBLEMS  *BOWEL PROBLEMS  UNUSUAL RASH Items with * indicate a potential emergency and should be followed up as soon as possible.  Feel free to call the clinic should you have any questions or concerns. The clinic phone number is [phone].  Please show the CHEMO ALERT CARD at check-in to the Emergency Department and triage nurse.   

## 2018-08-13 NOTE — Patient Instructions (Signed)

## 2018-08-14 LAB — CANCER ANTIGEN 27.29: CA 27.29: 46.1 U/mL — ABNORMAL HIGH (ref 0.0–38.6)

## 2018-08-16 ENCOUNTER — Telehealth: Payer: Self-pay | Admitting: Hematology

## 2018-08-16 NOTE — Telephone Encounter (Signed)
Scheduled appts per 01/17 los. ° °Printed and mailed calendar. °

## 2018-08-17 ENCOUNTER — Ambulatory Visit (HOSPITAL_COMMUNITY)
Admission: RE | Admit: 2018-08-17 | Discharge: 2018-08-17 | Disposition: A | Discharge status: Home / Self Care | Payer: BLUE CROSS/BLUE SHIELD | Source: Ambulatory Visit | Attending: Hematology | Admitting: Hematology

## 2018-08-17 ENCOUNTER — Other Ambulatory Visit: Payer: Self-pay | Admitting: Hematology

## 2018-08-17 ENCOUNTER — Telehealth: Payer: Self-pay

## 2018-08-17 DIAGNOSIS — Z170 Estrogen receptor positive status [ER+]: Secondary | ICD-10-CM | POA: Insufficient documentation

## 2018-08-17 DIAGNOSIS — C50211 Malignant neoplasm of upper-inner quadrant of right female breast: Secondary | ICD-10-CM

## 2018-08-17 NOTE — Progress Notes (Signed)
  Echocardiogram 2D Echocardiogram has been performed.  Claire Guzman 08/17/2018, 12:11 PM

## 2018-08-17 NOTE — Telephone Encounter (Signed)
Spoke with patient regarding lab results potassium low, instructed per Dr. Burr Medico to increase to twice daily for 3 days, she verbalized an understanding.  Also she reports an episode of rectal bleeding on Sunday with a bowel movement.  Per Dr. Burr Medico instructed her to report any further episodes of rectal bleeding.  Observe for now.  She verbalized an understanding.

## 2018-09-03 ENCOUNTER — Inpatient Hospital Stay: Payer: BLUE CROSS/BLUE SHIELD

## 2018-09-03 ENCOUNTER — Inpatient Hospital Stay: Payer: BLUE CROSS/BLUE SHIELD | Attending: Hematology

## 2018-09-03 VITALS — BP 156/68 | HR 82 | Temp 98.1°F | Resp 18

## 2018-09-03 DIAGNOSIS — Z79899 Other long term (current) drug therapy: Secondary | ICD-10-CM | POA: Insufficient documentation

## 2018-09-03 DIAGNOSIS — C50211 Malignant neoplasm of upper-inner quadrant of right female breast: Secondary | ICD-10-CM

## 2018-09-03 DIAGNOSIS — Z95828 Presence of other vascular implants and grafts: Secondary | ICD-10-CM

## 2018-09-03 DIAGNOSIS — Z9011 Acquired absence of right breast and nipple: Secondary | ICD-10-CM | POA: Insufficient documentation

## 2018-09-03 DIAGNOSIS — C773 Secondary and unspecified malignant neoplasm of axilla and upper limb lymph nodes: Secondary | ICD-10-CM | POA: Diagnosis not present

## 2018-09-03 DIAGNOSIS — I10 Essential (primary) hypertension: Secondary | ICD-10-CM | POA: Insufficient documentation

## 2018-09-03 DIAGNOSIS — Z5112 Encounter for antineoplastic immunotherapy: Secondary | ICD-10-CM | POA: Diagnosis not present

## 2018-09-03 DIAGNOSIS — R600 Localized edema: Secondary | ICD-10-CM | POA: Insufficient documentation

## 2018-09-03 DIAGNOSIS — Z170 Estrogen receptor positive status [ER+]: Secondary | ICD-10-CM

## 2018-09-03 DIAGNOSIS — Z9221 Personal history of antineoplastic chemotherapy: Secondary | ICD-10-CM | POA: Diagnosis not present

## 2018-09-03 DIAGNOSIS — R918 Other nonspecific abnormal finding of lung field: Secondary | ICD-10-CM | POA: Diagnosis not present

## 2018-09-03 DIAGNOSIS — F419 Anxiety disorder, unspecified: Secondary | ICD-10-CM | POA: Diagnosis not present

## 2018-09-03 LAB — CBC WITH DIFFERENTIAL (CANCER CENTER ONLY)
Abs Immature Granulocytes: 0.01 10*3/uL (ref 0.00–0.07)
Basophils Absolute: 0 10*3/uL (ref 0.0–0.1)
Basophils Relative: 0 %
EOS ABS: 0.1 10*3/uL (ref 0.0–0.5)
Eosinophils Relative: 3 %
HCT: 34.6 % — ABNORMAL LOW (ref 36.0–46.0)
Hemoglobin: 12.3 g/dL (ref 12.0–15.0)
Immature Granulocytes: 0 %
Lymphocytes Relative: 29 %
Lymphs Abs: 1.4 10*3/uL (ref 0.7–4.0)
MCH: 34.3 pg — ABNORMAL HIGH (ref 26.0–34.0)
MCHC: 35.5 g/dL (ref 30.0–36.0)
MCV: 96.4 fL (ref 80.0–100.0)
Monocytes Absolute: 0.3 10*3/uL (ref 0.1–1.0)
Monocytes Relative: 6 %
Neutro Abs: 2.9 10*3/uL (ref 1.7–7.7)
Neutrophils Relative %: 62 %
Platelet Count: 130 10*3/uL — ABNORMAL LOW (ref 150–400)
RBC: 3.59 MIL/uL — ABNORMAL LOW (ref 3.87–5.11)
RDW: 11.9 % (ref 11.5–15.5)
WBC: 4.7 10*3/uL (ref 4.0–10.5)
nRBC: 0 % (ref 0.0–0.2)

## 2018-09-03 LAB — CMP (CANCER CENTER ONLY)
ALK PHOS: 106 U/L (ref 38–126)
ALT: 14 U/L (ref 0–44)
AST: 19 U/L (ref 15–41)
Albumin: 4.1 g/dL (ref 3.5–5.0)
Anion gap: 11 (ref 5–15)
BUN: 11 mg/dL (ref 6–20)
CALCIUM: 9.7 mg/dL (ref 8.9–10.3)
CO2: 24 mmol/L (ref 22–32)
Chloride: 108 mmol/L (ref 98–111)
Creatinine: 0.7 mg/dL (ref 0.44–1.00)
GFR, Est AFR Am: >60 mL/min (ref >60)
GFR, Estimated: >60 mL/min (ref >60)
Glucose, Bld: 144 mg/dL — ABNORMAL HIGH (ref 70–99)
Potassium: 3.4 mmol/L — ABNORMAL LOW (ref 3.5–5.1)
Sodium: 143 mmol/L (ref 135–145)
TOTAL PROTEIN: 7.2 g/dL (ref 6.5–8.1)
Total Bilirubin: 0.8 mg/dL (ref 0.3–1.2)

## 2018-09-03 MED ORDER — TRASTUZUMAB CHEMO 150 MG IV SOLR
450.0000 mg | Freq: Once | INTRAVENOUS | Status: AC
  Administered 2018-09-03: 450 mg via INTRAVENOUS
  Filled 2018-09-03: qty 21.43

## 2018-09-03 MED ORDER — SODIUM CHLORIDE 0.9 % IV SOLN
Freq: Once | INTRAVENOUS | Status: AC
  Administered 2018-09-03: 09:00:00 via INTRAVENOUS
  Filled 2018-09-03: qty 250

## 2018-09-03 MED ORDER — DIPHENHYDRAMINE HCL 25 MG PO CAPS
50.0000 mg | ORAL_CAPSULE | Freq: Once | ORAL | Status: AC
  Administered 2018-09-03: 50 mg via ORAL

## 2018-09-03 MED ORDER — SODIUM CHLORIDE 0.9 % IV SOLN
420.0000 mg | Freq: Once | INTRAVENOUS | Status: AC
  Administered 2018-09-03: 420 mg via INTRAVENOUS
  Filled 2018-09-03: qty 14

## 2018-09-03 MED ORDER — ACETAMINOPHEN 325 MG PO TABS
650.0000 mg | ORAL_TABLET | Freq: Once | ORAL | Status: AC
  Administered 2018-09-03: 650 mg via ORAL

## 2018-09-03 MED ORDER — SODIUM CHLORIDE 0.9% FLUSH
10.0000 mL | INTRAVENOUS | Status: DC | PRN
  Administered 2018-09-03: 10 mL
  Filled 2018-09-03: qty 10

## 2018-09-03 MED ORDER — HEPARIN SOD (PORK) LOCK FLUSH 100 UNIT/ML IV SOLN
500.0000 [IU] | Freq: Once | INTRAVENOUS | Status: AC | PRN
  Administered 2018-09-03: 500 [IU]
  Filled 2018-09-03: qty 5

## 2018-09-03 MED ORDER — DIPHENHYDRAMINE HCL 25 MG PO CAPS
ORAL_CAPSULE | ORAL | Status: AC
  Filled 2018-09-03: qty 2

## 2018-09-03 MED ORDER — ACETAMINOPHEN 325 MG PO TABS
ORAL_TABLET | ORAL | Status: AC
  Filled 2018-09-03: qty 2

## 2018-09-03 NOTE — Patient Instructions (Signed)
[facility] Discharge Instructions for Patients Receiving Chemotherapy  Today you received the following chemotherapy agents :  Herceptin, Perjeta.  To help prevent nausea and vomiting after your treatment, we encourage you to take your nausea medication as prescribed.   If you develop nausea and vomiting that is not controlled by your nausea medication, call the clinic.   BELOW ARE SYMPTOMS THAT SHOULD BE REPORTED IMMEDIATELY:  *FEVER GREATER THAN 100.5 F  *CHILLS WITH OR WITHOUT FEVER  NAUSEA AND VOMITING THAT IS NOT CONTROLLED WITH YOUR NAUSEA MEDICATION  *UNUSUAL SHORTNESS OF BREATH  *UNUSUAL BRUISING OR BLEEDING  TENDERNESS IN MOUTH AND THROAT WITH OR WITHOUT PRESENCE OF ULCERS  *URINARY PROBLEMS  *BOWEL PROBLEMS  UNUSUAL RASH Items with * indicate a potential emergency and should be followed up as soon as possible.  Feel free to call the clinic should you have any questions or concerns. The clinic phone number is [phone].  Please show the CHEMO ALERT CARD at check-in to the Emergency Department and triage nurse.   

## 2018-09-03 NOTE — Pre-Procedure Instructions (Signed)
Claire Guzman  09/03/2018      [id number] - [place], [place] - [address] OF [place] [address] [place] [address] Phone: [phone] Fax: [phone]    Your procedure is scheduled on February 18  Report to [facility] Admitting at 0600 A.M.  Call this number if you have problems the morning of surgery:  [phone]   Remember:  Do not eat after midnight.  You may drink clear liquids until 0500 am .  Clear liquids allowed JKD:TOIZT, Juice (non-citric and without pulp), Carbonated beverages, Clear Tea, Black Coffee only and Gatorade    Take these medicines the morning of surgery with A SIP OF WATER  ALPRAZolam (XANAX)  atenolol (TENORMIN) the night before surgery HYDROcodone-acetaminophen (NORCO) omeprazole (PRILOSEC) venlafaxine (EFFEXOR)   7 days prior to surgery STOP taking any Aspirin (unless otherwise instructed by your surgeon), Aleve, Naproxen, Ibuprofen, Motrin, Advil, Goody's, BC's, all herbal medications, fish oil, and all vitamins.     Do not wear jewelry, make-up or nail polish.  Do not wear lotions, powders, or perfumes, or deodorant.  Do not shave 48 hours prior to surgery.    Do not bring valuables to the hospital.  [facility] is not responsible for any belongings or valuables.  Contacts, dentures or bridgework may not be worn into surgery.  Leave your suitcase in the car.  After surgery it may be brought to your room.  For patients admitted to the hospital, discharge time will be determined by your treatment team.  Patients discharged the day of surgery will not be allowed to drive home.    Special instructions:   [facility]- Preparing For Surgery  Before surgery, you can play an important role. Because skin is not sterile, your skin needs to be as free of germs as possible. You can reduce the number of germs on your skin by washing with CHG (chlorahexidine gluconate) Soap  before surgery.  CHG is an antiseptic cleaner which kills germs and bonds with the skin to continue killing germs even after washing.    Oral Hygiene is also important to reduce your risk of infection.  Remember - BRUSH YOUR TEETH THE MORNING OF SURGERY WITH YOUR REGULAR TOOTHPASTE  Please do not use if you have an allergy to CHG or antibacterial soaps. If your skin becomes reddened/irritated stop using the CHG.  Do not shave (including legs and underarms) for at least 48 hours prior to first CHG shower. It is OK to shave your face.  Please follow these instructions carefully.   1. Shower the NIGHT BEFORE SURGERY and the MORNING OF SURGERY with CHG.   2. If you chose to wash your hair, wash your hair first as usual with your normal shampoo.  3. After you shampoo, rinse your hair and body thoroughly to remove the shampoo.  4. Use CHG as you would any other liquid soap. You can apply CHG directly to the skin and wash gently with a scrungie or a clean washcloth.   5. Apply the CHG Soap to your body ONLY FROM THE NECK DOWN.  Do not use on open wounds or open sores. Avoid contact with your eyes, ears, mouth and genitals (private parts). Wash Face and genitals (private parts)  with your normal soap.  6. Wash thoroughly, paying special attention to the area where your surgery will be performed.  7. Thoroughly rinse your body  with warm water from the neck down.  8. DO NOT shower/wash with your normal soap after using and rinsing off the CHG Soap.  9. Pat yourself dry with a CLEAN TOWEL.  10. Wear CLEAN PAJAMAS to bed the night before surgery, wear comfortable clothes the morning of surgery  11. Place CLEAN SHEETS on your bed the night of your first shower and DO NOT SLEEP WITH PETS.    Day of Surgery:  Do not apply any deodorants/lotions.  Please wear clean clothes to the hospital/surgery center.   Remember to brush your teeth WITH YOUR REGULAR TOOTHPASTE.    Please read over the  following fact sheets that you were given.

## 2018-09-04 LAB — CANCER ANTIGEN 27.29: CA 27.29: 33.7 U/mL (ref 0.0–38.6)

## 2018-09-06 ENCOUNTER — Other Ambulatory Visit: Payer: Self-pay

## 2018-09-06 ENCOUNTER — Encounter (HOSPITAL_COMMUNITY)
Admission: RE | Admit: 2018-09-06 | Discharge: 2018-09-06 | Disposition: A | Discharge status: Home / Self Care | Payer: BLUE CROSS/BLUE SHIELD | Source: Ambulatory Visit | Attending: General Surgery | Admitting: General Surgery

## 2018-09-06 ENCOUNTER — Encounter (HOSPITAL_COMMUNITY): Payer: Self-pay | Admitting: *Deleted

## 2018-09-06 DIAGNOSIS — Z01812 Encounter for preprocedural laboratory examination: Secondary | ICD-10-CM | POA: Diagnosis not present

## 2018-09-06 HISTORY — DX: Other complications of anesthesia, initial encounter: T88.59XA

## 2018-09-06 HISTORY — DX: Adverse effect of unspecified anesthetic, initial encounter: T41.45XA

## 2018-09-06 LAB — COMPREHENSIVE METABOLIC PANEL
ALT: 18 U/L (ref 0–44)
AST: 24 U/L (ref 15–41)
Albumin: 4 g/dL (ref 3.5–5.0)
Alkaline Phosphatase: 84 U/L (ref 38–126)
Anion gap: 11 (ref 5–15)
BUN: 10 mg/dL (ref 6–20)
CO2: 25 mmol/L (ref 22–32)
CREATININE: 0.58 mg/dL (ref 0.44–1.00)
Calcium: 9.3 mg/dL (ref 8.9–10.3)
Chloride: 107 mmol/L (ref 98–111)
GFR calc Af Amer: >60 mL/min (ref >60)
GFR calc non Af Amer: >60 mL/min (ref >60)
Glucose, Bld: 106 mg/dL — ABNORMAL HIGH (ref 70–99)
Potassium: 3.5 mmol/L (ref 3.5–5.1)
Sodium: 143 mmol/L (ref 135–145)
Total Bilirubin: 0.7 mg/dL (ref 0.3–1.2)
Total Protein: 7 g/dL (ref 6.5–8.1)

## 2018-09-06 LAB — CBC WITH DIFFERENTIAL/PLATELET
Abs Immature Granulocytes: 0.02 10*3/uL (ref 0.00–0.07)
BASOS ABS: 0 10*3/uL (ref 0.0–0.1)
Basophils Relative: 0 %
Eosinophils Absolute: 0.2 10*3/uL (ref 0.0–0.5)
Eosinophils Relative: 4 %
HCT: 35.5 % — ABNORMAL LOW (ref 36.0–46.0)
Hemoglobin: 11.9 g/dL — ABNORMAL LOW (ref 12.0–15.0)
Immature Granulocytes: 0 %
Lymphocytes Relative: 31 %
Lymphs Abs: 1.8 10*3/uL (ref 0.7–4.0)
MCH: 32.6 pg (ref 26.0–34.0)
MCHC: 33.5 g/dL (ref 30.0–36.0)
MCV: 97.3 fL (ref 80.0–100.0)
Monocytes Absolute: 0.4 10*3/uL (ref 0.1–1.0)
Monocytes Relative: 8 %
Neutro Abs: 3.3 10*3/uL (ref 1.7–7.7)
Neutrophils Relative %: 57 %
Platelets: 137 10*3/uL — ABNORMAL LOW (ref 150–400)
RBC: 3.65 MIL/uL — ABNORMAL LOW (ref 3.87–5.11)
RDW: 11.7 % (ref 11.5–15.5)
WBC: 5.7 10*3/uL (ref 4.0–10.5)
nRBC: 0 % (ref 0.0–0.2)

## 2018-09-06 LAB — POCT PREGNANCY, URINE: Preg Test, Ur: NEGATIVE

## 2018-09-06 NOTE — Progress Notes (Signed)
PCP - [place] Cardiologist - denies  Chest x-ray - 07/29/18 EKG - 04/09/18 Stress Test - denies ECHO - 08/17/18 Cardiac Cath - denies   Anesthesia review: yes, convulsions after appendic surgery in 2007, spoke to [place] PA  Patient denies shortness of breath, fever, cough and chest pain at PAT appointment   Patient verbalized understanding of instructions that were given to them at the PAT appointment. Patient was also instructed that they will need to review over the PAT instructions again at home before surgery.

## 2018-09-07 NOTE — Progress Notes (Signed)
Anesthesia Chart Review:  Case:  [id number] Date/Time:  09/14/18 0745   Procedures:      RIGHT NIPPLE SPARING MASTECTOMY WITH TARGETED DEEP RIGHT AXILLARY LYMPH NODE BIOPSY WITH RADIOACTIVE SEED LOCALIZATION AND RIGHT AXILLARY SENTINEL LYMPH NODE BIOPSY AND POSSIBLE COMPLETION AXILLARY LYMPH NODE DISSECTION INJECT BLUE DYE RIGHT BREAST (N/A Breast)     BREAST RECONSTRUCTION WITH PLACEMENT OF TISSUE EXPANDER AND ALLODERM (Right )   Anesthesia type:  General   Pre-op diagnosis:  RIGHT BREAST CANCER   Location:  [place] OR ROOM 02 / [place] OR   Surgeon:  Fanny Skates, MD; Irene Limbo, MD      DISCUSSION: Patient is a 53 year old female scheduled for the above procedure.   History includes never smoker, HTN, HLD, GERD, right breast cancer (diagnosed 02/2018, invasive ductal carcinoma, cT2N1M0 stage 1B; s/p chemotherapy 03/30/18-07/23/18 with maintenance Hercpetin/Perjeta Q 3 weeks, starting 08/13/17), Port-a-cath 04/13/18. She was treated for right PNA 07/29/18.   Patient reported "convulsions" in PACU following 01/27/06 appendectomy. 01/27/06 anesthesia records obtained. Post-op anesthesia note on 01/27/06 at 4:20 PM noted patient alert, stable, no anesthesia complications. Post-op anesthesia note on 01/27/06 at 7:50 AM also noted patient to be alert, no problems. [facility] could not locate a discharge summary from this encounter. There is no neurology consult or cerebral imaging from that admission and appears to have been discharged on 01/28/06. No reported issues with previous or subsequent surgeries (hysteroscopy 2003, c-section 2005, Port-a-cath 2019). Question if she had postanesthetic shivering.  She denied SOB, cough, fever, and chest pain at PAT RN visit. History of convulsion (vs shivering) following 2007 appendectomy reviewed with anesthesiologist Nolon Nations, MD. I was unable to obtain any documentation that provides more specific details, but no complications per anesthesia follow-up and was  discharged home the following day. If no acute changes it is anticipated that she can proceed as planned. Pregnancy test negative--done at PAT since she is scheduled for radioactive seed implant on 09/13/18.    VS: BP 127/77   Pulse 90   Temp 36.7 C   Resp 20   Ht 5\' 4"  (1.626 m)   Wt 69.8 kg   LMP 02/03/2018 Comment: due to chemo therapy  SpO2 98%   BMI 26.42 kg/m     PROVIDERS: Koirala, Dibas, MD is PCP  Truitt Merle, MD is HEM-ONC. Eppie Gibson, MD is RAD-ONC. She is not followed by cardiology routinely, but saw Dorris Carnes, MD on 01/04/16 for evaluation of palpitations (since age 45's) and had a normal echo and EKG. Event monitor was never done.     LABS: Labs reviewed: Acceptable for surgery. She had a negative urine pregnancy test at PAT (needed prior to day of surgery given pending seed implant). (all labs ordered are listed, but only abnormal results are displayed)  Labs Reviewed  CBC WITH DIFFERENTIAL/PLATELET - Abnormal; Notable for the following components:      Result Value   RBC 3.65 (*)    Hemoglobin 11.9 (*)    HCT 35.5 (*)    Platelets 137 (*)    All other components within normal limits  COMPREHENSIVE METABOLIC PANEL - Abnormal; Notable for the following components:   Glucose, Bld 106 (*)    All other components within normal limits  POCT PREGNANCY, URINE    IMAGES: CXR 07/29/18 (ordered by Sandi Mealy, PA-C): IMPRESSION: - New right upper lobe airspace opacity is noted concerning for pneumonia. - Rounded densities is seen overlying both lung bases most  consistent with overlying nipple shadow, but repeat radiograph nipple markers is recommended for confirmation and to rule out pulmonary nodules.  CT Chest/ab/pelvis 03/24/18 (ordered by Truitt Merle, MD): IMPRESSION: 1. Mass within the deep aspect of the right breast along the pectoralis muscle compatible with primary breast malignancy. 2. There is suggestion of two additional possible masses within  the lateral aspect of the right breast versus nodular appearing breast tissue. Recommend dedicated evaluation with bilateral breast MRI. 3. Mildly thickened right axillary lymph node compatible with metastatic adenopathy, recently biopsied. 4. Multiple bilateral pulmonary nodules are indeterminate, potentially sequelae of prior infectious/inflammatory process. Recommend attention on follow-up. Consider follow-up CT in 6 months. 5. Portal venous gas is demonstrated within the left hepatic lobe. Additionally, there is wall thickening of the cecum and ascending colon with small amount of gas within the pericolonic vasculature. Constellation of findings is indeterminate in etiology however may be secondary to colitis at this location with considerations including infectious, inflammatory or ischemic etiologies. Correlate for recent procedure. If patient is not up-to-date for colonic screening, recommend further evaluation with colonoscopy to exclude the possibility of colonic mass within the cecum/ascending colon. (As of 08/13/18, Dr. Burr Medico plans to repeat CT chest in 3-6 months.)  MRI brain 03/20/18 (ordered by Truitt Merle, MD): IMPRESSION: - No evidence of metastatic disease.  Normal appearance of brain. - Slightly heterogeneous marrow pattern of the upper cervical spine, nonspecific but likely benign, especially in the setting of early stage disease.   EKG: 04/09/18: NSR   CV: Echo 08/17/18: Study Conclusions - Left ventricle: The cavity size was normal. Systolic function was   normal. The estimated ejection fraction was in the range of 60%   to 65%. Wall motion was normal; there were no regional wall   motion abnormalities. Doppler parameters are consistent with   abnormal left ventricular relaxation (grade 1 diastolic   dysfunction). Doppler parameters are consistent with   indeterminate ventricular filling pressure. - Aortic valve: Transvalvular velocity was within the normal  range.   There was no stenosis. There was no regurgitation. Valve area   (VTI): 2.04 cm^2. Valve area (Vmax): 2.28 cm^2. Valve area   (Vmean): 2.06 cm^2. - Mitral valve: Transvalvular velocity was within the normal range.   There was no evidence for stenosis. There was trivial   regurgitation. - Right ventricle: The cavity size was normal. Wall thickness was   normal. Systolic function was normal. - Tricuspid valve: There was trivial regurgitation. - Pulmonary arteries: Systolic pressure was within the normal   range. - Global longitudinal strain -20.2% (normal).   Past Medical History:  Diagnosis Date  . Abdominal pain, unspecified site   . Anxiety state, unspecified   . Cancer [facility])    R breast cancer  . Complication of anesthesia    convulsions - when she has appendectomy about 12 years ago at [facility]  . Contact dermatitis and other eczema, due to unspecified cause   . Essential hypertension, benign   . Family history of colon cancer   . Family history of melanoma   . Family history of prostate cancer   . GERD (gastroesophageal reflux disease)   . Hematuria, unspecified   . Hypertension   . Lower back pain   . Mixed hyperlipidemia   . Rosacea   . Unspecified symptom associated with female genital organs     Past Surgical History:  Procedure Laterality Date  . CESAREAN SECTION    . DIAGNOSTIC LAPAROSCOPY    .  EYE SURGERY     lasix  . LAPAROSCOPIC APPENDECTOMY    . PORTACATH PLACEMENT Left 04/13/2018   Procedure: INSERTION PORT-A-CATH WITH [place];  Surgeon: Fanny Skates, MD;  Location: [place];  Service: General;  Laterality: Left;  . UTERINE FIBROID SURGERY      MEDICATIONS: . ALPRAZolam (XANAX) 0.5 MG tablet  . atenolol (TENORMIN) 50 MG tablet  . Baclofen 5 MG TABS  . cyclobenzaprine (FLEXERIL) 5 MG tablet  . furosemide (LASIX) 20 MG tablet  . HYDROcodone-acetaminophen (NORCO) 5-325 MG tablet  . HYDROcodone-homatropine (HYCODAN) 5-1.5  MG/5ML syrup  . levofloxacin (LEVAQUIN) 500 MG tablet  . mupirocin ointment (BACTROBAN) 2 %  . omeprazole (PRILOSEC) 20 MG capsule  . potassium chloride SA (K-DUR,KLOR-CON) 20 MEQ tablet  . traMADol (ULTRAM) 50 MG tablet  . triamcinolone ointment (KENALOG) 0.5 %  . venlafaxine (EFFEXOR) 37.5 MG tablet   No current facility-administered medications for this encounter.   She is not currently taking Baclofen, Flexeril, Lasix, Hycodan, Levofloxacin, Bactroban, KCl, Kenalog.  Myra Gianotti, PA-C Surgical Short Stay/Anesthesiology [facility] Phone [phone] [facility] Phone [phone] 09/08/2018 10:58 PM

## 2018-09-08 NOTE — Anesthesia Preprocedure Evaluation (Addendum)
Anesthesia Evaluation  Patient identified by MRN, date of birth, ID band Patient awake    Reviewed: Allergy & Precautions, H&P , NPO status , Patient's Chart, lab work & pertinent test results, reviewed documented beta blocker date and time   Airway Mallampati: II  TM Distance: >3 FB Neck ROM: Full    Dental no notable dental hx. (+) Teeth Intact, Dental Advisory Given   Pulmonary neg pulmonary ROS,    Pulmonary exam normal breath sounds clear to auscultation       Cardiovascular Exercise Tolerance: Good hypertension, Pt. on medications and Pt. on home beta blockers  Rhythm:Regular Rate:Normal     Neuro/Psych Anxiety negative neurological ROS     GI/Hepatic Neg liver ROS, GERD  Medicated and Controlled,  Endo/Other  negative endocrine ROS  Renal/GU negative Renal ROS  negative genitourinary   Musculoskeletal   Abdominal   Peds  Hematology negative hematology ROS (+)   Anesthesia Other Findings   Reproductive/Obstetrics negative OB ROS                           Anesthesia Physical Anesthesia Plan  ASA: II  Anesthesia Plan: General   Post-op Pain Management:  Regional for Post-op pain   Induction: Intravenous  PONV Risk Score and Plan: 4 or greater and Ondansetron, Dexamethasone and Midazolam  Airway Management Planned: Oral ETT  Additional Equipment:   Intra-op Plan:   Post-operative Plan: Extubation in OR  Informed Consent: I have reviewed the patients History and Physical, chart, labs and discussed the procedure including the risks, benefits and alternatives for the proposed anesthesia with the patient or authorized representative who has indicated his/her understanding and acceptance.     Dental advisory given  Plan Discussed with: CRNA  Anesthesia Plan Comments: (PAT note written by Myra Gianotti, PA-C. )       Anesthesia Quick Evaluation

## 2018-09-12 NOTE — H&P (Signed)
Claire Guzman Location: [facility] Patient #: [id number] DOB: [birth year] Married / Language: English / Race: White Female        History of Present Illness      . This is a very pleasant 53 year old female.. She is here following neoadjuvant chemotherapy to plan definitive surgery for her right breast cancer. She is followed by Dr. Burr Medico, Dr. Isidore Moos, Dr. Freda Munro, and Dr. Mechele Collin.      Dr. Ouida Sills felt a lump on the right breast and mammograms were unremarkable but ultrasound showed a 2.1 cm mass in the right breast upper inner quadrant. This was far posterior area. A suspicious lymph node was also noted. Image guided biopsy showed a grade 3 invasive ductal carcinoma. Lymph node also had metastatic cancer. ER 90%. PR 5%. Ki-67 60%. HER-2 positive. Genetic testing negative MRI on August 2019 showed a 3.2 cm mass which was abutting the muscle. Numerous other satellite masses were noted. Port-A-Cath was inserted on April 13, 2018. She preferred mastectomy without further biopsies. Staging scans were negative. Restaging CT showed bilateral multiple tiny lung nodules. Indeterminate. Follow-up in 6 months has been planned. She has seen Dr. Iran Planas who thinks she is a good candidate for either nipple sparing or skin sparing mastectomy. Her breasts are small. The patient prefers nipple sparing mastectomy after extensive discussion.       She tolerated her chemotherapy. End of treatment MRI on 07/24/18 shows complete radiologic response in the breast and the right axillary lymph node has decreased in size. This was always a solitary lymph node.      We had a long talk about skin sparing versus nipple sparing mastectomy. She knows that there is slight increased risk of skin necrosis and nipple necrosis with nipple sparing mastectomy. Her breasts are small, low BMI, non-smoker... and she should be a good candidate. She prefers NSM. She understands that the  nipple will be insensate and will lose erectile function. I discussed the indications, details, techniques, and numerous risk of surgery with the patient and her husband. She is aware the risk of bleeding, infection, skin necrosis, chronic pain, revisional surgery, arm swelling, sensory deficit of arm, shoulder disability. She does she will need drainage. She knows she will need to be hospitalized overnight. She understands all these issues. All of her questions were answered. She agrees with this plan. Case has been discussed in breast conference and consensus achieved as above.      Plan: Scheduled for right nipple sparing mastectomy, inject blue dye, targeted axillary dissection, sentinel lymph node mapping and biopsy She does not want contralateral prophylactic mastectomy and I agree In terms of management of the axilla, we will place a radioactive seed in the targeted node and do a frozen section on that. If frozen section is positive for cancer we will proceed with level I and level II axillary lymph node dissection in hopes of avoiding a second operation in the setting of immediate reconstruction Port will be left in so she may complete 1 year of Herceptin chemotherapy      Past Surgical History  Appendectomy   Diagnostic Studies History  Colonoscopy  never Mammogram  within last year 1-3 years ago Pap Smear  1-5 years ago  Allergies  Codeine Sulfate *ANALGESICS - OPIOID*  Itching, Nausea, Vomiting. Paxil *ANTIDEPRESSANTS*  Lethargic/felt bad PredniSONE (Pak) *CORTICOSTEROIDS*  Turns skin red, heart racing TraMADol HCl *ANALGESICS - OPIOID*  Itching. Zomig *MIGRAINE PRODUCTS*  Elevated blood pressure Allergies Reconciled  Medication History  Xanax (0.25MG Tablet, Oral) Active. Atenolol (50MG Tablet, Oral) Active. Voltaren (75MG Tablet DR, Oral) Active. Lisinopril (10MG Tablet, Oral) Active. Norgestimate-Eth Estradiol (0.25-35MG-MCG Tablet, Oral)  Active. HYDROcodone-Acetaminophen (5-325MG Tablet, Oral) Active. Medications Reconciled  Social History  Alcohol use  Remotely quit alcohol use. No drug use  Tobacco use  Never smoker.  Family History  Hypertension  Sister. Prostate Cancer  Father.  Pregnancy / Birth History  Age at menarche  32 years, 87 years. Contraceptive History  Oral contraceptives. Gravida  3 2 Irregular periods  Maternal age  75-20 31-35 Para  1  Other Problems  Anxiety Disorder  Back Pain  Breast Cancer  Gastroesophageal Reflux Disease  Lump In Breast     Review of Systems  General Not Present- Appetite Loss, Chills, Fatigue, Fever, Night Sweats, Weight Gain and Weight Loss. Skin Not Present- Change in Wart/Mole, Dryness, Hives, Jaundice, New Lesions, Non-Healing Wounds, Rash and Ulcer. HEENT Present- Visual Disturbances. Not Present- Earache, Hearing Loss, Hoarseness, Nose Bleed, Oral Ulcers, Ringing in the Ears, Seasonal Allergies, Sinus Pain, Sore Throat, Wears glasses/contact lenses and Yellow Eyes. Respiratory Not Present- Bloody sputum, Chronic Cough, Difficulty Breathing, Snoring and Wheezing. Breast Not Present- Breast Mass, Breast Pain, Nipple Discharge and Skin Changes. Cardiovascular Not Present- Chest Pain, Difficulty Breathing Lying Down, Leg Cramps, Palpitations, Rapid Heart Rate, Shortness of Breath and Swelling of Extremities. Gastrointestinal Present- Change in Bowel Habits. Not Present- Abdominal Pain, Bloating, Bloody Stool, Chronic diarrhea, Constipation, Difficulty Swallowing, Excessive gas, Gets full quickly at meals, Hemorrhoids, Indigestion, Nausea, Rectal Pain and Vomiting. Neurological Not Present- Decreased Memory, Fainting, Headaches, Numbness, Seizures, Tingling, Tremor, Trouble walking and Weakness.  Vitals  Weight: 121.8 lb Height: 62in Body Surface Area: 1.55 m Body Mass Index: 22.28 kg/m  Temp.: 97.76F(Temporal)  Pulse: 81 (Regular)   P.OX: 98% (Room air) BP: 108/70 (Sitting, Left Arm, Standard)       Physical Exam  General Mental Status-Alert. General Appearance-Not in acute distress. Build & Nutrition-Well nourished. Posture-Normal posture. Gait-Normal.  Head and Neck Head-normocephalic, atraumatic with no lesions or palpable masses. Trachea-midline. Thyroid Gland Characteristics - normal size and consistency and no palpable nodules.  Chest and Lung Exam Chest and lung exam reveals -on auscultation, normal breath sounds, no adventitious sounds and normal vocal resonance. Note: Port site looks good, left side   Breast Note: Breasts are small. Skin is healthy. No palpable mass in either breast. No skin change. No palpable axillary adenopathy.   Cardiovascular Cardiovascular examination reveals -normal heart sounds, regular rate and rhythm with no murmurs and femoral artery auscultation bilaterally reveals normal pulses, no bruits, no thrills.  Abdomen Inspection Inspection of the abdomen reveals - No Hernias. Palpation/Percussion Palpation and Percussion of the abdomen reveal - Soft, Non Tender, No Rigidity (guarding), No hepatosplenomegaly and No Palpable abdominal masses.  Neurologic Neurologic evaluation reveals -alert and oriented x 3 with no impairment of recent or remote memory, normal attention span and ability to concentrate, normal sensation and normal coordination.  Musculoskeletal Normal Exam - Bilateral-Upper Extremity Strength Normal and Lower Extremity Strength Normal.    Assessment & Plan  PRIMARY CANCER OF UPPER INNER QUADRANT OF RIGHT FEMALE BREAST (C50.211)    You have had a very good response to the neoadjuvant chemotherapy your MRI shows no abnormality in the breast. There is still a chance of isolated cancer cells within the breast, however The solitary lymph node under your right arm is smaller Your genetic testing is negative, which is good  news  We talked about skin sparing mastectomy, nipple sparing mastectomy, targeted axillary dissection, possible axillary lymph node dissection. you and your husband are aware of the pros and cons of the different surgical techniques   Our plan is to proceed with right nipple sparing mastectomy, targeted axillary dissection with sentinel lymph node biopsy, frozen section, possible completion axillary lymph node dissection if there is cancer in the lymph nodes, and immediate implant-based reconstruction by Dr. Iran Planas The port will be left in because you will need to complete 1 year of Herceptin chemotherapy We do not plan any surgery on the left breast you will need to be admitted to the hospital, probably for just one night I discussed the indications, techniques, and risk of the surgery in detail with you and your husband  Eventually you will likely receive radiation therapy and antiestrogen therapy.   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 Dalbert Batman, M.D., [facility] General and Minimally invasive Surgery Breast and Colorectal Surgery Office:   [phone] Pager:   [phone]

## 2018-09-13 ENCOUNTER — Ambulatory Visit
Admission: RE | Admit: 2018-09-13 | Discharge: 2018-09-13 | Disposition: A | Discharge status: Home / Self Care | Payer: BLUE CROSS/BLUE SHIELD | Source: Ambulatory Visit | Attending: Hematology | Admitting: Hematology

## 2018-09-13 ENCOUNTER — Ambulatory Visit
Admission: RE | Admit: 2018-09-13 | Discharge: 2018-09-13 | Disposition: A | Discharge status: Home / Self Care | Payer: BLUE CROSS/BLUE SHIELD | Source: Ambulatory Visit | Attending: General Surgery | Admitting: General Surgery

## 2018-09-13 ENCOUNTER — Inpatient Hospital Stay: Admission: RE | Admit: 2018-09-13 | Payer: BLUE CROSS/BLUE SHIELD | Source: Ambulatory Visit

## 2018-09-13 DIAGNOSIS — Z170 Estrogen receptor positive status [ER+]: Secondary | ICD-10-CM

## 2018-09-13 DIAGNOSIS — C50211 Malignant neoplasm of upper-inner quadrant of right female breast: Secondary | ICD-10-CM

## 2018-09-13 DIAGNOSIS — C50912 Malignant neoplasm of unspecified site of left female breast: Secondary | ICD-10-CM | POA: Diagnosis not present

## 2018-09-13 DIAGNOSIS — R590 Localized enlarged lymph nodes: Secondary | ICD-10-CM | POA: Diagnosis not present

## 2018-09-14 ENCOUNTER — Encounter (HOSPITAL_COMMUNITY): Payer: Self-pay | Admitting: Certified Registered Nurse Anesthetist

## 2018-09-14 ENCOUNTER — Encounter (HOSPITAL_COMMUNITY)
Admission: RE | Disposition: A | Discharge status: Home / Self Care | Payer: Self-pay | Source: Home / Self Care | Attending: Plastic Surgery

## 2018-09-14 ENCOUNTER — Encounter (HOSPITAL_COMMUNITY)
Admission: RE | Admit: 2018-09-14 | Discharge: 2018-09-14 | Disposition: A | Discharge status: Home / Self Care | Payer: BLUE CROSS/BLUE SHIELD | Source: Ambulatory Visit | Attending: General Surgery | Admitting: General Surgery

## 2018-09-14 ENCOUNTER — Ambulatory Visit
Admission: RE | Admit: 2018-09-14 | Discharge: 2018-09-14 | Disposition: A | Discharge status: Home / Self Care | Payer: BLUE CROSS/BLUE SHIELD | Source: Ambulatory Visit | Attending: General Surgery | Admitting: General Surgery

## 2018-09-14 ENCOUNTER — Ambulatory Visit (HOSPITAL_COMMUNITY): Payer: BLUE CROSS/BLUE SHIELD | Admitting: Certified Registered Nurse Anesthetist

## 2018-09-14 ENCOUNTER — Observation Stay (HOSPITAL_COMMUNITY)
Admission: RE | Admit: 2018-09-14 | Discharge: 2018-09-15 | Disposition: A | Discharge status: Home / Self Care | Payer: BLUE CROSS/BLUE SHIELD | Attending: Plastic Surgery | Admitting: Plastic Surgery

## 2018-09-14 ENCOUNTER — Ambulatory Visit (HOSPITAL_COMMUNITY): Payer: BLUE CROSS/BLUE SHIELD | Admitting: Vascular Surgery

## 2018-09-14 DIAGNOSIS — Z800 Family history of malignant neoplasm of digestive organs: Secondary | ICD-10-CM | POA: Diagnosis not present

## 2018-09-14 DIAGNOSIS — C773 Secondary and unspecified malignant neoplasm of axilla and upper limb lymph nodes: Secondary | ICD-10-CM | POA: Diagnosis not present

## 2018-09-14 DIAGNOSIS — F419 Anxiety disorder, unspecified: Secondary | ICD-10-CM | POA: Insufficient documentation

## 2018-09-14 DIAGNOSIS — Z170 Estrogen receptor positive status [ER+]: Secondary | ICD-10-CM | POA: Insufficient documentation

## 2018-09-14 DIAGNOSIS — Z8249 Family history of ischemic heart disease and other diseases of the circulatory system: Secondary | ICD-10-CM | POA: Insufficient documentation

## 2018-09-14 DIAGNOSIS — Z886 Allergy status to analgesic agent status: Secondary | ICD-10-CM | POA: Insufficient documentation

## 2018-09-14 DIAGNOSIS — K219 Gastro-esophageal reflux disease without esophagitis: Secondary | ICD-10-CM | POA: Diagnosis not present

## 2018-09-14 DIAGNOSIS — Z888 Allergy status to other drugs, medicaments and biological substances status: Secondary | ICD-10-CM | POA: Insufficient documentation

## 2018-09-14 DIAGNOSIS — Z9221 Personal history of antineoplastic chemotherapy: Secondary | ICD-10-CM | POA: Diagnosis not present

## 2018-09-14 DIAGNOSIS — Z885 Allergy status to narcotic agent status: Secondary | ICD-10-CM | POA: Insufficient documentation

## 2018-09-14 DIAGNOSIS — Z79899 Other long term (current) drug therapy: Secondary | ICD-10-CM | POA: Diagnosis not present

## 2018-09-14 DIAGNOSIS — Z881 Allergy status to other antibiotic agents status: Secondary | ICD-10-CM | POA: Insufficient documentation

## 2018-09-14 DIAGNOSIS — Z791 Long term (current) use of non-steroidal anti-inflammatories (NSAID): Secondary | ICD-10-CM | POA: Insufficient documentation

## 2018-09-14 DIAGNOSIS — C50211 Malignant neoplasm of upper-inner quadrant of right female breast: Secondary | ICD-10-CM | POA: Diagnosis not present

## 2018-09-14 DIAGNOSIS — C50911 Malignant neoplasm of unspecified site of right female breast: Secondary | ICD-10-CM | POA: Diagnosis present

## 2018-09-14 DIAGNOSIS — Z8042 Family history of malignant neoplasm of prostate: Secondary | ICD-10-CM | POA: Diagnosis not present

## 2018-09-14 DIAGNOSIS — N6011 Diffuse cystic mastopathy of right breast: Secondary | ICD-10-CM | POA: Diagnosis not present

## 2018-09-14 DIAGNOSIS — I10 Essential (primary) hypertension: Secondary | ICD-10-CM | POA: Insufficient documentation

## 2018-09-14 DIAGNOSIS — G8918 Other acute postprocedural pain: Secondary | ICD-10-CM | POA: Diagnosis not present

## 2018-09-14 DIAGNOSIS — R590 Localized enlarged lymph nodes: Secondary | ICD-10-CM | POA: Diagnosis not present

## 2018-09-14 HISTORY — PX: BREAST RECONSTRUCTION WITH PLACEMENT OF TISSUE EXPANDER AND ALLODERM: SHX6805

## 2018-09-14 HISTORY — PX: MASTECTOMY WITH RADIOACTIVE SEED GUIDED EXCISION AND AXILLARY SENTINEL LYMPH NODE BIOPSY: SHX6736

## 2018-09-14 LAB — POCT PREGNANCY, URINE: Preg Test, Ur: NEGATIVE

## 2018-09-14 SURGERY — MASTECTOMY WITH RADIOACTIVE SEED GUIDED EXCISION AND AXILLARY SENTINEL LYMPH NODE BIOPSY
Anesthesia: General | Site: Breast | Laterality: Right

## 2018-09-14 MED ORDER — CEFAZOLIN SODIUM-DEXTROSE 1-4 GM/50ML-% IV SOLN
1.0000 g | Freq: Three times a day (TID) | INTRAVENOUS | Status: AC
  Administered 2018-09-14 – 2018-09-15 (×3): 1 g via INTRAVENOUS
  Filled 2018-09-14 (×3): qty 50

## 2018-09-14 MED ORDER — CEFAZOLIN SODIUM-DEXTROSE 2-4 GM/100ML-% IV SOLN
2.0000 g | INTRAVENOUS | Status: AC
  Administered 2018-09-14: 2 g via INTRAVENOUS
  Filled 2018-09-14: qty 100

## 2018-09-14 MED ORDER — EPHEDRINE 5 MG/ML INJ
INTRAVENOUS | Status: AC
  Filled 2018-09-14: qty 10

## 2018-09-14 MED ORDER — MIDAZOLAM HCL 2 MG/2ML IJ SOLN
INTRAMUSCULAR | Status: AC
  Administered 2018-09-14: 2 mg via INTRAVENOUS
  Filled 2018-09-14: qty 2

## 2018-09-14 MED ORDER — LACTATED RINGERS IV SOLN
INTRAVENOUS | Status: DC | PRN
  Administered 2018-09-14 (×2): via INTRAVENOUS

## 2018-09-14 MED ORDER — CHLORHEXIDINE GLUCONATE CLOTH 2 % EX PADS: 6.0000 | MEDICATED_PAD | Freq: Once | CUTANEOUS | Status: DC

## 2018-09-14 MED ORDER — CELECOXIB 200 MG PO CAPS
200.0000 mg | ORAL_CAPSULE | ORAL | Status: AC
  Administered 2018-09-14: 200 mg via ORAL
  Filled 2018-09-14: qty 1

## 2018-09-14 MED ORDER — ONDANSETRON 4 MG PO TBDP: 4.0000 mg | ORAL_TABLET | Freq: Four times a day (QID) | ORAL | Status: DC | PRN

## 2018-09-14 MED ORDER — MIDAZOLAM HCL 2 MG/2ML IJ SOLN
2.0000 mg | Freq: Once | INTRAMUSCULAR | Status: AC
  Administered 2018-09-14: 2 mg via INTRAVENOUS

## 2018-09-14 MED ORDER — TECHNETIUM TC 99M SULFUR COLLOID FILTERED
1.0000 | Freq: Once | INTRAVENOUS | Status: AC | PRN
  Administered 2018-09-14: 1 via INTRADERMAL

## 2018-09-14 MED ORDER — HYDROMORPHONE HCL 1 MG/ML IJ SOLN
0.2500 mg | INTRAMUSCULAR | Status: DC | PRN
  Administered 2018-09-14 (×3): 0.5 mg via INTRAVENOUS

## 2018-09-14 MED ORDER — ONDANSETRON HCL 4 MG/2ML IJ SOLN
INTRAMUSCULAR | Status: AC
  Filled 2018-09-14: qty 2

## 2018-09-14 MED ORDER — SODIUM CHLORIDE 0.9 % IV SOLN
Freq: Once | INTRAVENOUS | Status: DC
  Filled 2018-09-14: qty 1

## 2018-09-14 MED ORDER — KCL IN DEXTROSE-NACL 20-5-0.45 MEQ/L-%-% IV SOLN
INTRAVENOUS | Status: DC
  Administered 2018-09-14: 15:00:00 via INTRAVENOUS
  Filled 2018-09-14 (×2): qty 1000

## 2018-09-14 MED ORDER — HYDROCODONE-ACETAMINOPHEN 5-325 MG PO TABS
1.0000 | ORAL_TABLET | ORAL | Status: DC | PRN
  Administered 2018-09-14: 1 via ORAL
  Administered 2018-09-15 (×2): 2 via ORAL
  Filled 2018-09-14 (×3): qty 2

## 2018-09-14 MED ORDER — FENTANYL CITRATE (PF) 100 MCG/2ML IJ SOLN
INTRAMUSCULAR | Status: AC
  Administered 2018-09-14: 100 ug via INTRAVENOUS
  Filled 2018-09-14: qty 2

## 2018-09-14 MED ORDER — HYDROMORPHONE HCL 1 MG/ML IJ SOLN
INTRAMUSCULAR | Status: AC
  Filled 2018-09-14: qty 1

## 2018-09-14 MED ORDER — SODIUM CHLORIDE (PF) 0.9 % IJ SOLN
INTRAVENOUS | Status: DC | PRN
  Administered 2018-09-14: 5 mL

## 2018-09-14 MED ORDER — SODIUM CHLORIDE (PF) 0.9 % IJ SOLN
INTRAMUSCULAR | Status: AC
  Filled 2018-09-14: qty 10

## 2018-09-14 MED ORDER — HYDROCODONE-ACETAMINOPHEN 5-325 MG PO TABS: 1.0000 | ORAL_TABLET | ORAL | 0 refills | Status: DC | PRN

## 2018-09-14 MED ORDER — SUGAMMADEX SODIUM 200 MG/2ML IV SOLN
INTRAVENOUS | Status: DC | PRN
  Administered 2018-09-14: 200 mg via INTRAVENOUS

## 2018-09-14 MED ORDER — KETOROLAC TROMETHAMINE 30 MG/ML IJ SOLN
30.0000 mg | Freq: Four times a day (QID) | INTRAMUSCULAR | Status: AC
  Administered 2018-09-14 – 2018-09-15 (×3): 30 mg via INTRAVENOUS
  Filled 2018-09-14 (×2): qty 1

## 2018-09-14 MED ORDER — 0.9 % SODIUM CHLORIDE (POUR BTL) OPTIME
TOPICAL | Status: DC | PRN
  Administered 2018-09-14: 1000 mL

## 2018-09-14 MED ORDER — FENTANYL CITRATE (PF) 250 MCG/5ML IJ SOLN
INTRAMUSCULAR | Status: AC
  Filled 2018-09-14: qty 5

## 2018-09-14 MED ORDER — CYCLOBENZAPRINE HCL 5 MG PO TABS: 5.0000 mg | ORAL_TABLET | Freq: Three times a day (TID) | ORAL | 0 refills | Status: DC | PRN

## 2018-09-14 MED ORDER — TRAMADOL HCL 50 MG PO TABS: 50.0000 mg | ORAL_TABLET | Freq: Four times a day (QID) | ORAL | Status: DC | PRN

## 2018-09-14 MED ORDER — BUPIVACAINE HCL (PF) 0.5 % IJ SOLN
INTRAMUSCULAR | Status: DC | PRN
  Administered 2018-09-14: 20 mL

## 2018-09-14 MED ORDER — CYCLOBENZAPRINE HCL 5 MG PO TABS
5.0000 mg | ORAL_TABLET | Freq: Three times a day (TID) | ORAL | Status: DC | PRN
  Administered 2018-09-14: 5 mg via ORAL
  Filled 2018-09-14: qty 1

## 2018-09-14 MED ORDER — ENOXAPARIN SODIUM 40 MG/0.4ML ~~LOC~~ SOLN
40.0000 mg | SUBCUTANEOUS | Status: DC
  Administered 2018-09-15: 40 mg via SUBCUTANEOUS
  Filled 2018-09-14: qty 0.4

## 2018-09-14 MED ORDER — PROPOFOL 10 MG/ML IV BOLUS
INTRAVENOUS | Status: AC
  Filled 2018-09-14: qty 40

## 2018-09-14 MED ORDER — SODIUM CHLORIDE 0.9 % IV SOLN
INTRAVENOUS | Status: DC | PRN
  Administered 2018-09-14: 30 ug/min via INTRAVENOUS

## 2018-09-14 MED ORDER — FENTANYL CITRATE (PF) 100 MCG/2ML IJ SOLN
INTRAMUSCULAR | Status: DC | PRN
  Administered 2018-09-14: 100 ug via INTRAVENOUS
  Administered 2018-09-14: 50 ug via INTRAVENOUS
  Administered 2018-09-14 (×4): 25 ug via INTRAVENOUS

## 2018-09-14 MED ORDER — DEXAMETHASONE SODIUM PHOSPHATE 10 MG/ML IJ SOLN
INTRAMUSCULAR | Status: DC | PRN
  Administered 2018-09-14: 10 mg via INTRAVENOUS

## 2018-09-14 MED ORDER — METHYLENE BLUE 0.5 % INJ SOLN
INTRAVENOUS | Status: AC
  Filled 2018-09-14: qty 10

## 2018-09-14 MED ORDER — PHENYLEPHRINE 40 MCG/ML (10ML) SYRINGE FOR IV PUSH (FOR BLOOD PRESSURE SUPPORT)
PREFILLED_SYRINGE | INTRAVENOUS | Status: AC
  Filled 2018-09-14: qty 10

## 2018-09-14 MED ORDER — ROCURONIUM BROMIDE 50 MG/5ML IV SOSY
PREFILLED_SYRINGE | INTRAVENOUS | Status: DC | PRN
  Administered 2018-09-14: 50 mg via INTRAVENOUS

## 2018-09-14 MED ORDER — VENLAFAXINE HCL 37.5 MG PO TABS
37.5000 mg | ORAL_TABLET | Freq: Two times a day (BID) | ORAL | Status: DC
  Administered 2018-09-14 – 2018-09-15 (×3): 37.5 mg via ORAL
  Filled 2018-09-14 (×3): qty 1

## 2018-09-14 MED ORDER — ONDANSETRON HCL 4 MG/2ML IJ SOLN
INTRAMUSCULAR | Status: DC | PRN
  Administered 2018-09-14 (×2): 4 mg via INTRAVENOUS

## 2018-09-14 MED ORDER — KETOROLAC TROMETHAMINE 30 MG/ML IJ SOLN
INTRAMUSCULAR | Status: AC
  Filled 2018-09-14: qty 1

## 2018-09-14 MED ORDER — PROPOFOL 10 MG/ML IV BOLUS
INTRAVENOUS | Status: DC | PRN
  Administered 2018-09-14: 130 mg via INTRAVENOUS

## 2018-09-14 MED ORDER — HYDROMORPHONE HCL 1 MG/ML IJ SOLN
0.5000 mg | INTRAMUSCULAR | Status: DC | PRN
  Administered 2018-09-14: 0.5 mg via INTRAVENOUS
  Filled 2018-09-14: qty 1

## 2018-09-14 MED ORDER — BUPIVACAINE-EPINEPHRINE 0.25% -1:200000 IJ SOLN
INTRAMUSCULAR | Status: DC | PRN
  Administered 2018-09-14: 5 mL

## 2018-09-14 MED ORDER — BUPIVACAINE LIPOSOME 1.3 % IJ SUSP
INTRAMUSCULAR | Status: DC | PRN
  Administered 2018-09-14: 10 mL

## 2018-09-14 MED ORDER — ACETAMINOPHEN 500 MG PO TABS
1000.0000 mg | ORAL_TABLET | ORAL | Status: AC
  Administered 2018-09-14: 1000 mg via ORAL
  Filled 2018-09-14: qty 2

## 2018-09-14 MED ORDER — LIDOCAINE 2% (20 MG/ML) 5 ML SYRINGE
INTRAMUSCULAR | Status: AC
  Filled 2018-09-14: qty 5

## 2018-09-14 MED ORDER — EPHEDRINE SULFATE-NACL 50-0.9 MG/10ML-% IV SOSY
PREFILLED_SYRINGE | INTRAVENOUS | Status: DC | PRN
  Administered 2018-09-14 (×2): 5 mg via INTRAVENOUS

## 2018-09-14 MED ORDER — ONDANSETRON HCL 4 MG/2ML IJ SOLN: 4.0000 mg | Freq: Four times a day (QID) | INTRAMUSCULAR | Status: DC | PRN

## 2018-09-14 MED ORDER — BUPIVACAINE-EPINEPHRINE (PF) 0.25% -1:200000 IJ SOLN
INTRAMUSCULAR | Status: AC
  Filled 2018-09-14: qty 30

## 2018-09-14 MED ORDER — LIDOCAINE 2% (20 MG/ML) 5 ML SYRINGE
INTRAMUSCULAR | Status: DC | PRN
  Administered 2018-09-14: 60 mg via INTRAVENOUS

## 2018-09-14 MED ORDER — PANTOPRAZOLE SODIUM 40 MG PO TBEC
40.0000 mg | DELAYED_RELEASE_TABLET | Freq: Every day | ORAL | Status: DC
  Administered 2018-09-14 – 2018-09-15 (×2): 40 mg via ORAL
  Filled 2018-09-14 (×2): qty 1

## 2018-09-14 MED ORDER — ATENOLOL 50 MG PO TABS
50.0000 mg | ORAL_TABLET | Freq: Every day | ORAL | Status: DC
  Administered 2018-09-14: 50 mg via ORAL
  Filled 2018-09-14: qty 1

## 2018-09-14 MED ORDER — GABAPENTIN 300 MG PO CAPS
300.0000 mg | ORAL_CAPSULE | ORAL | Status: AC
  Administered 2018-09-14: 300 mg via ORAL
  Filled 2018-09-14: qty 1

## 2018-09-14 MED ORDER — FENTANYL CITRATE (PF) 100 MCG/2ML IJ SOLN
100.0000 ug | Freq: Once | INTRAMUSCULAR | Status: AC
  Administered 2018-09-14: 100 ug via INTRAVENOUS

## 2018-09-14 MED ORDER — SULFAMETHOXAZOLE-TRIMETHOPRIM 800-160 MG PO TABS: 1.0000 | ORAL_TABLET | Freq: Two times a day (BID) | ORAL | 0 refills | Status: DC

## 2018-09-14 MED ORDER — ALPRAZOLAM 0.5 MG PO TABS
0.5000 mg | ORAL_TABLET | Freq: Every evening | ORAL | Status: DC | PRN
  Administered 2018-09-14: 0.5 mg via ORAL
  Filled 2018-09-14: qty 1

## 2018-09-14 MED ORDER — DEXAMETHASONE SODIUM PHOSPHATE 10 MG/ML IJ SOLN
INTRAMUSCULAR | Status: AC
  Filled 2018-09-14: qty 1

## 2018-09-14 MED ORDER — ROCURONIUM BROMIDE 50 MG/5ML IV SOSY
PREFILLED_SYRINGE | INTRAVENOUS | Status: AC
  Filled 2018-09-14: qty 5

## 2018-09-14 MED ORDER — PHENYLEPHRINE 40 MCG/ML (10ML) SYRINGE FOR IV PUSH (FOR BLOOD PRESSURE SUPPORT)
PREFILLED_SYRINGE | INTRAVENOUS | Status: DC | PRN
  Administered 2018-09-14 (×5): 80 ug via INTRAVENOUS

## 2018-09-14 MED ORDER — SODIUM CHLORIDE 0.9 % IV SOLN
Freq: Once | INTRAVENOUS | Status: AC
  Administered 2018-09-14: 500 mL
  Filled 2018-09-14: qty 1

## 2018-09-14 SURGICAL SUPPLY — 64 items
ADH SKN CLS APL DERMABOND .7 (GAUZE/BANDAGES/DRESSINGS) ×2
ALLOGRAFT PERF 16X20 1.6+/-0.4 (Tissue) ×1 IMPLANT
APPLIER CLIP 11 MED OPEN (CLIP) ×2
APR CLP MED 11 20 MLT OPN (CLIP) ×1
BAG DECANTER FOR FLEXI CONT (MISCELLANEOUS) ×2 IMPLANT
BINDER BREAST LRG (GAUZE/BANDAGES/DRESSINGS) IMPLANT
BINDER BREAST XLRG (GAUZE/BANDAGES/DRESSINGS) ×2 IMPLANT
CANISTER SUCT 3000ML PPV (MISCELLANEOUS) ×2 IMPLANT
CHLORAPREP W/TINT 26ML (MISCELLANEOUS) ×3 IMPLANT
CLIP APPLIE 11 MED OPEN (CLIP) IMPLANT
COVER SURGICAL LIGHT HANDLE (MISCELLANEOUS) ×2 IMPLANT
COVER WAND RF STERILE (DRAPES) ×2 IMPLANT
DERMABOND ADVANCED (GAUZE/BANDAGES/DRESSINGS) ×2
DERMABOND ADVANCED .7 DNX12 (GAUZE/BANDAGES/DRESSINGS) ×2 IMPLANT
DEVICE DISSECT PLASMABLAD 3.0S (MISCELLANEOUS) IMPLANT
DRAIN CHANNEL 15F RND FF W/TCR (WOUND CARE) ×1 IMPLANT
DRAIN CHANNEL 19F RND (DRAIN) ×1 IMPLANT
DRAPE HALF SHEET 40X57 (DRAPES) ×2 IMPLANT
DRAPE ORTHO SPLIT 77X108 STRL (DRAPES) ×4
DRAPE SURG ORHT 6 SPLT 77X108 (DRAPES) ×2 IMPLANT
DRAPE WARM FLUID 44X44 (DRAPE) ×2 IMPLANT
DRSG PAD ABDOMINAL 8X10 ST (GAUZE/BANDAGES/DRESSINGS) ×4 IMPLANT
DRSG TEGADERM 4X4.75 (GAUZE/BANDAGES/DRESSINGS) ×7 IMPLANT
ELECT BLADE 4.0 EZ CLEAN MEGAD (MISCELLANEOUS) ×2
ELECT BLADE 6.5 EXT (BLADE) ×1 IMPLANT
ELECT CAUTERY BLADE 6.4 (BLADE) ×2 IMPLANT
ELECT COATED BLADE 2.86 ST (ELECTRODE) ×2 IMPLANT
ELECT REM PT RETURN 9FT ADLT (ELECTROSURGICAL) ×4
ELECTRODE BLDE 4.0 EZ CLN MEGD (MISCELLANEOUS) ×1 IMPLANT
ELECTRODE REM PT RTRN 9FT ADLT (ELECTROSURGICAL) ×1 IMPLANT
EVACUATOR SILICONE 100CC (DRAIN) ×1 IMPLANT
GLOVE BIO SURGEON STRL SZ 6 (GLOVE) ×6 IMPLANT
GOWN STRL REUS W/ TWL LRG LVL3 (GOWN DISPOSABLE) ×2 IMPLANT
GOWN STRL REUS W/TWL LRG LVL3 (GOWN DISPOSABLE) ×4
KIT BASIN OR (CUSTOM PROCEDURE TRAY) ×2 IMPLANT
KIT TURNOVER KIT B (KITS) ×2 IMPLANT
LIGHT WAVEGUIDE WIDE FLAT (MISCELLANEOUS) ×1 IMPLANT
MARKER SKIN DUAL TIP RULER LAB (MISCELLANEOUS) ×2 IMPLANT
NS IRRIG 1000ML POUR BTL (IV SOLUTION) ×4 IMPLANT
PACK GENERAL/GYN (CUSTOM PROCEDURE TRAY) ×2 IMPLANT
PAD ARMBOARD 7.5X6 YLW CONV (MISCELLANEOUS) ×2 IMPLANT
PIN SAFETY STERILE (MISCELLANEOUS) ×2 IMPLANT
PLASMABLADE 3.0S (MISCELLANEOUS) ×2
SET ASEPTIC TRANSFER (MISCELLANEOUS) ×2 IMPLANT
SOL PREP POV-IOD 4OZ 10% (MISCELLANEOUS) ×2 IMPLANT
STAPLER VISISTAT 35W (STAPLE) ×2 IMPLANT
SUT CHROMIC 4 0 PS 2 18 (SUTURE) ×2 IMPLANT
SUT ETHILON 2 0 FS 18 (SUTURE) ×1 IMPLANT
SUT MNCRL AB 4-0 PS2 18 (SUTURE) ×2 IMPLANT
SUT SILK 2 0 SH (SUTURE) ×1 IMPLANT
SUT VIC AB 0 CT2 27 (SUTURE) ×3 IMPLANT
SUT VIC AB 3-0 SH 27 (SUTURE) ×2
SUT VIC AB 3-0 SH 27X BRD (SUTURE) IMPLANT
SUT VIC AB 3-0 SH 8-18 (SUTURE) ×1 IMPLANT
SUT VIC AB 4-0 PS2 18 (SUTURE) ×1 IMPLANT
SUT VICRYL 4-0 PS2 18IN ABS (SUTURE) IMPLANT
SUT VLOC 180 0 24IN GS25 (SUTURE) ×1 IMPLANT
SYR BULB IRRIGATION 50ML (SYRINGE) ×3 IMPLANT
SYRINGE 60CC LL (MISCELLANEOUS) ×1 IMPLANT
TISSUE EXPANDER FV 300CC ×1 IMPLANT
TOWEL OR 17X24 6PK STRL BLUE (TOWEL DISPOSABLE) ×2 IMPLANT
TOWEL OR 17X26 10 PK STRL BLUE (TOWEL DISPOSABLE) ×2 IMPLANT
TRAY FOLEY MTR SLVR 16FR STAT (SET/KITS/TRAYS/PACK) ×1 IMPLANT
TUBE CONNECTING 12X1/4 (SUCTIONS) ×2 IMPLANT

## 2018-09-14 NOTE — H&P (Addendum)
Subjective:     Patient ID: Claire Guzman is a 53 y.o. female.  HPI  Patient of Drs. Dalbert Batman and Burr Medico here for right mastectomy with reconstruction. Presented with palpable right breast mass (less than month after negative screening MMG). Diagnostic MMG/US with right breast mass at 2:30, 1 cmfn measuring 2.0 x 1.6 x 2.1 cm, abutting the chest wall. There was one thickened LN. Biopsy demonstrated triple positive IDC,+ LVI, LN + for metastatic disease.  MRI showed primary malignancy in the posterior right breast 3.2 x 2 x 2.6 cm with apparent invasion of the underlying pectoralis muscle. Numerous surrounding satellite lesions, with a total span of suspected disease measuring 4.5 x 4.6 x 6.4 cm. Known metastatic node noted in the right axilla.  Completed neoadjuvant chemotherapy, final MRI with complete pathologic response. She will receive adjuvant radiation. Experienced avulsion nails and drainage with chemotherapy.  Genetics VUS in the POLD1. Staging scans showed bilateral multiple small lung nodules, 3 to 4 mm, indeterminate, likely benign, plan repeat CT chest in 6 months.   Current 38 B Wt stable. Has lost job and finances are major concern. Lives with husband and teenage daughter.    Objective:   Physical Exam  Constitutional: She is oriented to person, place, and time.  Cardiovascular: Normal rate, regular rhythm and normal heart sounds.  Pulmonary/Chest: Effort normal and breath sounds normal.  Abdominal: Soft.  Lymphadenopathy:    She has no axillary adenopathy.  Neurological: She is alert and oriented to person, place, and time.  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 Reviewed  will be asensate and not stimulate. Reviewed risks mastectomy flap necrosis requiring additional surgery.  We reviewed the increased risk of complications associated with radiation and reconstruction. Autologous tissue as part of reconstruction would reduce this risk. One option to delay reconstruction- discussed 6 months post XRT as starting process of reconstruction. In this delayed setting could elect for DIEP vs LD + TE. Reviewed the benefit of flap with regards to aesthetics and wound healing would be diminished if she pursued this in immediate setting- reviewed this again as she reports her husband asking why everything cannot be done at once, or if anyway to avoid flap. Discussed could elect for implant exchange alone in future without flap, but flap surgery would always be part of discussion.  She could elect for TE placement at time of mastectomy. The benefit of this  if she was candidate for NSM this would preserve soft tissue envelope. If expander in place, should anticipate this would be in place for several months during adjuvant treatment and appr 6 months post XRT.   Reviewed use of acellular dermis in reconstruction, cadaveric source, incorporation over several weeks, risk that if has seroma or infection can act as additional nidus for infection if not incorporated. Reviewed prepectoral vs sub pectoral reconstruction. Discussed with patient benefit ofprepectoral positionis no animation deformity, may be less pain. Some literature to suggest same or improved changed with RT in prepectoral/ADM setting. Risk may be more visible rippling over upper poles, greater need of ADM. Discussed any type reconstruction also risks long  term displacement implant and visible rippling. Plan prepectoral placement.  Additional risks including but not limited to bleeding, hematoma, infection, damage to deeper structures, DVT/PE, cardiopulmonary complications reviewed.  Reviewed drains, hospital stay,  post op limitations and visits.    Irene Limbo, MD [facility] Plastic & Reconstructive Surgery [phone], pin [phone]

## 2018-09-14 NOTE — Anesthesia Procedure Notes (Signed)
Anesthesia Regional Block: Pectoralis block   Pre-Anesthetic Checklist: ,, timeout performed, Correct Patient, Correct Site, Correct Laterality, Correct Procedure, Correct Position, site marked, Risks and benefits discussed, pre-op evaluation,  At surgeon's request and post-op pain management  Laterality: Right  Prep: Maximum Sterile Barrier Precautions used, chloraprep       Needles:  Injection technique: Single-shot  Needle Type: Echogenic Stimulator Needle     Needle Length: 9cm  Needle Gauge: 21     Additional Needles:   Procedures:,,,, ultrasound used (permanent image in chart),,,,  Narrative:  Start time: 09/14/2018 6:52 AM End time: 09/14/2018 7:02 AM Injection made incrementally with aspirations every 5 mL. Anesthesiologist: Roderic Palau, MD  Additional Notes: 2% Lidocaine skin wheel.

## 2018-09-14 NOTE — Op Note (Signed)
Operative Note   DATE OF OPERATION: 2.18.20  LOCATION:  Main OR-observation  SURGICAL DIVISION: Plastic Surgery  PREOPERATIVE DIAGNOSES:  1. Right breast cancer ER+ multicentric 2. Neoadjuvant chemotherapy  POSTOPERATIVE DIAGNOSES:  same  PROCEDURE:  1. Right breast reconstruction with tissue expander 2. Acellular dermis (Alloderm) for breast reconstruction 300 cm2  SURGEON: Irene Limbo MD MBA  ASSISTANT: L Smail RNFA student  ANESTHESIA:  General.   EBL: 100 ml for entire procedure  COMPLICATIONS: None immediate.   INDICATIONS FOR PROCEDURE:  The patient, Claire Guzman, is a 53 y.o. female born on 1966-06-25, is here for immediate prepectoral expander acellular dermis breast reconstruction following right nipple sparing mastectomy.    FINDINGS: Natrelle 133S FV-11-T 300 ml tissue expander placed, initial fill volume 180 ml air. SN [id number]  DESCRIPTION OF PROCEDURE:  The patient was marked with the patient in the preoperative area to mark sternal notch, chest midline, anterior axillary lines and inframammary folds. The patient's operative site was prepped and draped in a sterile fashion. A time out was performed and all information was confirmed to be correct. I assisted in mastectomy in retraction and exposure.   Following completion of mastectomy, cavity was irrigated with solution containing Ancef, gentamicin, and bacitracin. Hemostasis was ensured. Laterally the mastectomy flap over posterior axillary line was advanced anteriorly and the subcutaneous tissue and superficial fascia was secured to pectoralis muscle and acellular dermis with 0-vicryl.  A 19 Fr drain was placed in subcutaneous position laterally and a 15 Fr drain placed along inframammary fold. Each secured to skin with 2-0 nylon. Cavity irrigated with Betadine. The tissue expander was prepared on back table prior in insertion. The expander was filled with air to 180 ml air.Perforated acellular  dermiswasdraped over anterior surface expander. The ADM was then secured to itself over posterior surface of expander with 4-0 chromic. Redundant folds acellular dermis excised so that the ADM lay flat without folds over air filled expander.The expander was secured tofascia over lateral sternal borderwith a 0 vicryl. Thelateral tab wasalso secured to pectoralis muscle with 0-vicryl. The ADM was secured to pectoralis muscle and chest wall along inferior border at inframammary foldwith 0 V-lock suture.Skin closure completedwith 3-0 vicryl in fascial layer and 4-0 vicryl in dermis. Skin closure completed with 4-0 monocryl subcuticular and tissue adhesive.  The mastectomy flap was redraped so that NAC was symmetric from midline and sternal notch with left breast. Tegaderm dressings applied followed bydry dressing,breast binder.  The patient was allowed to wake from anesthesia, extubated and taken to the recovery room in satisfactory condition.   SPECIMENS: none  DRAINS: 15 and 19 Fr JP in right reconstructed breast  Irene Limbo, MD [facility] Plastic & Reconstructive Surgery [phone], pin [phone]

## 2018-09-14 NOTE — Interval H&P Note (Signed)
History and Physical Interval Note:  09/14/2018 7:44 AM  Claire Guzman  has presented today for surgery, with the diagnosis of RIGHT BREAST CANCER  The various methods of treatment have been discussed with the patient and family. After consideration of risks, benefits and other options for treatment, the patient has consented to  Procedure(s): RIGHT NIPPLE SPARING MASTECTOMY WITH TARGETED DEEP RIGHT AXILLARY LYMPH NODE BIOPSY WITH RADIOACTIVE SEED LOCALIZATION AND RIGHT AXILLARY SENTINEL LYMPH NODE BIOPSY, FROZEN SECTION AND POSSIBLE COMPLETION AXILLARY LYMPH NODE DISSECTION INJECT BLUE DYE RIGHT BREAST (N/A) BREAST RECONSTRUCTION WITH PLACEMENT OF TISSUE EXPANDER AND ALLODERM (Right) as a surgical intervention .  The patient's history has been reviewed, patient examined, no change in status, stable for surgery.  I have reviewed the patient's chart and labs.  Questions were answered to the patient's satisfaction.     Adin Hector

## 2018-09-14 NOTE — Anesthesia Postprocedure Evaluation (Signed)
Anesthesia Post Note  Patient: Claire Guzman  Procedure(s) Performed: RIGHT NIPPLE SPARING MASTECTOMY WITH TARGETED DEEP RIGHT AXILLARY LYMPH NODE BIOPSY WITH RADIOACTIVE SEED LOCALIZATION AND RIGHT AXILLARY SENTINEL LYMPH NODE BIOPSY, FROZEN SECTION AND POSSIBLE COMPLETION AXILLARY LYMPH NODE DISSECTION INJECT BLUE DYE RIGHT BREAST (Right Breast) BREAST RECONSTRUCTION WITH PLACEMENT OF TISSUE EXPANDER AND ALLODERM (Right Breast)     Patient location during evaluation: PACU Anesthesia Type: General and Regional Level of consciousness: awake and alert Pain management: pain level controlled Vital Signs Assessment: post-procedure vital signs reviewed and stable Respiratory status: spontaneous breathing, nonlabored ventilation and respiratory function stable Cardiovascular status: blood pressure returned to baseline and stable Postop Assessment: no apparent nausea or vomiting Anesthetic complications: no    Last Vitals:  Vitals:   09/14/18 1221 09/14/18 1222  BP:  138/66  Pulse: 80 68  Resp: 16 11  Temp:    SpO2: 100% 100%    Last Pain:  Vitals:   09/14/18 1200  TempSrc:   PainSc: 9                  Chiara Coltrin,W. EDMOND

## 2018-09-14 NOTE — Transfer of Care (Signed)
Immediate Anesthesia Transfer of Care Note  Patient: Claire Guzman  Procedure(s) Performed: RIGHT NIPPLE SPARING MASTECTOMY WITH TARGETED DEEP RIGHT AXILLARY LYMPH NODE BIOPSY WITH RADIOACTIVE SEED LOCALIZATION AND RIGHT AXILLARY SENTINEL LYMPH NODE BIOPSY, FROZEN SECTION AND POSSIBLE COMPLETION AXILLARY LYMPH NODE DISSECTION INJECT BLUE DYE RIGHT BREAST (Right Breast) BREAST RECONSTRUCTION WITH PLACEMENT OF TISSUE EXPANDER AND ALLODERM (Right Breast)  Patient Location: PACU  Anesthesia Type:GA combined with regional for post-op pain  Level of Consciousness: awake, alert  and oriented  Airway & Oxygen Therapy: Patient Spontanous Breathing and Patient connected to face mask oxygen  Post-op Assessment: Report given to RN and Post -op Vital signs reviewed and stable  Post vital signs: Reviewed and stable  Last Vitals:  Vitals Value Taken Time  BP 116/62 09/14/2018 11:37 AM  Temp    Pulse 86 09/14/2018 11:37 AM  Resp 16 09/14/2018 11:37 AM  SpO2 100 % 09/14/2018 11:37 AM  Vitals shown include unvalidated device data.  Last Pain:  Vitals:   09/14/18 0711  TempSrc:   PainSc: 0-No pain      Patients Stated Pain Goal: 4 (22/44/97 5300)  Complications: No apparent anesthesia complications

## 2018-09-14 NOTE — Op Note (Signed)
Patient Name:           Claire Guzman   Date of Surgery:        09/14/2018  Pre op Diagnosis:      Multifocal cancer right breast                                       Right axillary lymph node metastasis                                       Status post neoadjuvant chemotherapy  Post op Diagnosis:    Same  Procedure:                 Inject blue dye right breast                                      Targeted right axillary deep lymph node biopsy with radioactive seed localization                                       Frozen section                                       Right axillary sentinel lymph node mapping and biopsy                                        Right nipple sparing mastectomy                                        Right nipple biopsy  Surgeon:                     Edsel Petrin. Dalbert Batman, M.D., FACS  Assistant:                     Dr.  Iran Planas   Indication for Assistant: Complex exposure and technique  Operative Indications:   This is a very pleasant 53 year old female who is brought to the operating room following neoadjuvant therapy for definitive surgical management of her right breast cancer... She is followed by Dr. Burr Medico, Dr. Isidore Moos, Dr. Iran Planas, Dr. Freda Munro, and Dr. Mechele Collin.      Dr. Ouida Sills felt a lump on the right breast and mammograms were unremarkable but ultrasound showed a 2.1 cm mass in the right breast upper inner quadrant. This was far posterior area. A suspicious lymph node was also noted. Image guided biopsy showed a grade 3 invasive ductal carcinoma. Lymph node also had metastatic cancer. ER 90%. PR 5%. Ki-67 60%. HER-2 positive. Genetic testing negative MRI on August 2019 showed a 3.2 cm mass which was abutting the muscle. Numerous other satellite masses were noted. Port-A-Cath was inserted on April 13, 2018. She preferred mastectomy without further biopsies. Staging scans were negative. Restaging CT showed bilateral multiple tiny  lung  nodules. Indeterminate. Follow-up in 6 months has been planned. She has seen Dr. Iran Planas who thinks she is a good candidate for either nipple sparing or skin sparing mastectomy. Her breasts are small. The patient prefers nipple sparing mastectomy after extensive discussion.       She tolerated her chemotherapy. End of treatment MRI on 07/24/18 shows complete radiologic response in the breast and the right axillary lymph node has decreased in size. This was always a solitary lymph node. I discussed the indications, details, techniques, and numerous risk of surgery with the patient and her husband. . She agrees with this plan.      Scheduled for right nipple sparing mastectomy, inject blue dye, targeted axillary dissection, sentinel lymph node mapping and biopsy She does not want contralateral prophylactic mastectomy and I agree In terms of management of the axilla, we will place a radioactive seed in the targeted node and do a frozen section on that. If frozen section is positive for cancer we will proceed with level I and level II axillary lymph node dissection in hopes of avoiding a second operation in the setting of immediate reconstruction Port will be left in so she may complete 1 year of Herceptin chemotherapy  Operative Findings:       I identified and radiographically confirmed the targeted right axillary node, confirming placement of the radioactive seed.  Frozen section of this targeted node was negative for cancer cells.  I performed a sentinel lymph node mapping and biopsy in the right axilla and probably remove 3 or 4 other nodes.  There was no palpable abnormality in the right axilla.  Right nipple sparing mastectomy was performed in the usual fashion.  When I took the dissection up to the upper inner quadrant there was no palpable mass posteriorly or anteriorly.  Thus there was no gross palpable tumor within the breast.  Procedure in Detail:          Technetium nuclear  medicine  injection and right pectoral block was performed in the holding area.  The patient was taken to the operating room and underwent general endotracheal anesthesia.  Intravenous antibiotics were given.  Surgical timeout was performed.  Following alcohol prep I injected 5 cc of dilute methylene blue into the upper outer quadrant of the right breast and massaged the breast for a few minutes.  The entire neck, both chest walls and upper abdomen were then prepped and draped in a sterile fashion.      Using the neoprobe I found that I could hear the targeted right axillary node.  I made a transverse incision in the right axilla at the hairline.  Dissection was carried down through the clavipectoral fascia.  Using the neoprobe I identified the targeted node with the radioactive seed.  This was removed.  Specimen radiograph confirmed the presence of the marker clip and the seed.  This single lymph node was sent to the pathology lab and Dr. Vicente Males stated that the frozen section was negative for cancer cells.  The neoprobe was then sent to the technetium 99 setting, and I found 3 or 4 other sentinel lymph nodes that had blue dye and radioactivity in them and sent them as a separate specimen.  That was all that we found.  Hemostasis in the right axilla was achieved with electrocautery and metal clips.  The wound was irrigated.  Hemostasis was excellent.  The clavipectoral fascia was closed with interrupted 3-0 Vicryl and the skin closed with a running subcuticular  4-0 Monocryl      I made a transverse incision at the right inframammary crease starting about 4 or 5 cm lateral to the midline.  This incision was about 12 cm in length.  I took the dissection down to the fascia and then dissected the posterior breast tissue off of the anterior rectus sheath and the pectoralis major and minor muscles.  I took the posterior dissection all the way up to the infraclavicular area, laterally I took it out to the latissimus  dorsi muscle.  Medially I took it to the parasternal area.  I then  dissected the anterior skin envelope off of the breast parenchyma this was done with plasma blade and facelift scissors.  I was very careful to avoid thin dissection to preserve vascularity.  Using a knife I took a biopsy of the nipple posteriorly and sent that as a separate specimen.  Using a silk suture I marked the retroareolar breast tissue for the pathologist.  I carried the stent dissection up to the superior pole of the breast and then dissected the breast tissue from medial to lateral.  The specimen was removed.  The axillary tail was marked with a silk suture for the pathologist.  The specimen was sent to the lab.  Hemostasis was excellent and achieved with electrocautery.  The wound was irrigated.     Estimated blood loss at this point in the case was 100 cc or less.  Counts correct.  Complications none.  Control of the operative procedure was turned over to Dr. Iran Planas who will dictate the reconstructive procedure separately.     Edsel Petrin. Dalbert Batman, M.D., FACS General and Minimally Invasive Surgery Breast and Colorectal Surgery  09/14/2018 10:33 AM

## 2018-09-14 NOTE — Anesthesia Procedure Notes (Signed)
Procedure Name: Intubation Date/Time: 09/14/2018 8:07 AM Performed by: Genelle Bal, CRNA Pre-anesthesia Checklist: Patient identified, Emergency Drugs available, Suction available and Patient being monitored Patient Re-evaluated:Patient Re-evaluated prior to induction Oxygen Delivery Method: Circle system utilized Preoxygenation: Pre-oxygenation with 100% oxygen Induction Type: IV induction Ventilation: Mask ventilation without difficulty and Oral airway inserted - appropriate to patient size Laryngoscope Size: Glidescope and 3 Grade View: Grade I Tube type: Oral Tube size: 7.0 mm Number of attempts: 1 Airway Equipment and Method: Stylet,  Oral airway,  Video-laryngoscopy and Bougie stylet Placement Confirmation: ETT inserted through vocal cords under direct vision,  positive ETCO2 and breath sounds checked- equal and bilateral Secured at: 20 cm Tube secured with: Tape Dental Injury: Teeth and Oropharynx as per pre-operative assessment  Difficulty Due To: Difficulty was unanticipated and Difficult Airway- due to anterior larynx Future Recommendations: Recommend- induction with short-acting agent, and alternative techniques readily available Comments: DL x1 Mil 2 by Alford Highland CRNA, grade IV view, unsuccessful attempt with bougie stylet, Glidescope #3 x1, grade I view, ATOI w/ 7.0 OETT, +BBS/etCo2, secured @ 20cm @ lip with tape.

## 2018-09-15 ENCOUNTER — Encounter (HOSPITAL_COMMUNITY): Payer: Self-pay | Admitting: General Surgery

## 2018-09-15 DIAGNOSIS — Z885 Allergy status to narcotic agent status: Secondary | ICD-10-CM | POA: Diagnosis not present

## 2018-09-15 DIAGNOSIS — Z791 Long term (current) use of non-steroidal anti-inflammatories (NSAID): Secondary | ICD-10-CM | POA: Diagnosis not present

## 2018-09-15 DIAGNOSIS — C773 Secondary and unspecified malignant neoplasm of axilla and upper limb lymph nodes: Secondary | ICD-10-CM | POA: Diagnosis not present

## 2018-09-15 DIAGNOSIS — Z8042 Family history of malignant neoplasm of prostate: Secondary | ICD-10-CM | POA: Diagnosis not present

## 2018-09-15 DIAGNOSIS — K219 Gastro-esophageal reflux disease without esophagitis: Secondary | ICD-10-CM | POA: Diagnosis not present

## 2018-09-15 DIAGNOSIS — Z170 Estrogen receptor positive status [ER+]: Secondary | ICD-10-CM | POA: Diagnosis not present

## 2018-09-15 DIAGNOSIS — Z9221 Personal history of antineoplastic chemotherapy: Secondary | ICD-10-CM | POA: Diagnosis not present

## 2018-09-15 DIAGNOSIS — F419 Anxiety disorder, unspecified: Secondary | ICD-10-CM | POA: Diagnosis not present

## 2018-09-15 DIAGNOSIS — Z888 Allergy status to other drugs, medicaments and biological substances status: Secondary | ICD-10-CM | POA: Diagnosis not present

## 2018-09-15 DIAGNOSIS — Z800 Family history of malignant neoplasm of digestive organs: Secondary | ICD-10-CM | POA: Diagnosis not present

## 2018-09-15 DIAGNOSIS — C50211 Malignant neoplasm of upper-inner quadrant of right female breast: Secondary | ICD-10-CM | POA: Diagnosis not present

## 2018-09-15 DIAGNOSIS — Z8249 Family history of ischemic heart disease and other diseases of the circulatory system: Secondary | ICD-10-CM | POA: Diagnosis not present

## 2018-09-15 DIAGNOSIS — Z881 Allergy status to other antibiotic agents status: Secondary | ICD-10-CM | POA: Diagnosis not present

## 2018-09-15 DIAGNOSIS — Z886 Allergy status to analgesic agent status: Secondary | ICD-10-CM | POA: Diagnosis not present

## 2018-09-15 DIAGNOSIS — I10 Essential (primary) hypertension: Secondary | ICD-10-CM | POA: Diagnosis not present

## 2018-09-15 DIAGNOSIS — Z79899 Other long term (current) drug therapy: Secondary | ICD-10-CM | POA: Diagnosis not present

## 2018-09-15 NOTE — Progress Notes (Signed)
Patient discharged to home with instructions. 

## 2018-09-15 NOTE — Discharge Summary (Signed)
Physician Discharge Summary  Patient ID: Claire Guzman MRN: [id number] DOB/AGE: 52-Mar-1967 52 y.o.  Admit date: 09/14/2018 Discharge date: 09/15/2018  Admission Diagnoses: Right breast cancer  Discharge Diagnoses:  Active Problems:   Breast cancer, right [facility])  Discharged Condition: stable  Hospital Course: Post operatively patient did well ambulating without assist, tolerating diet and pain controlled on oral medication. Instructed on incision and drain care.  Treatments: surgery: right nipple sparing mastectomy sentinel node right breast reconstruction with tissue expander acellular dermis 2.18.20  Discharge Exam: Blood pressure 133/78, pulse 73, temperature 98.2 F (36.8 C), temperature source Oral, resp. rate 16, height 5\' 4"  (1.626 m), weight 68.7 kg, SpO2 98 %. Incision/Wound: Tegaderms in place chest soft drains serosanguinous incisions intact skin flaps viable  Disposition: Discharge disposition: 01-Home or Self Care       Discharge Instructions    Call MD for:  redness, tenderness, or signs of infection (pain, swelling, bleeding, redness, odor or green/yellow discharge around incision site)   Complete by:  As directed    Call MD for:  temperature >100.5   Complete by:  As directed    Discharge instructions   Complete by:  As directed    Ok to remove dressings and shower am 2.20.20. Soap and water ok, pat Tegaderms dry. Do not remove Tegaderms. No creams or ointments over incisions. Do not let drains dangle in shower, attach to lanyard or similar.Strip and record drains twice daily and bring log to clinic visit.  Breast binder or soft compression bra all other times.  Ok to raise arms above shoulders for bathing and dressing.  No house yard work or exercise until cleared by MD.   Recommend Miralax or Dulcolax as needed for constipation. Recommend ibuprofen with meals as directed to aid with pain control.   Driving Restrictions   Complete by:  As directed    No driving for 2 weeks then no driving if taking narcotics   Lifting restrictions   Complete by:  As directed    No lifting > 5 lbs until cleared by MD   Resume previous diet   Complete by:  As directed      Allergies as of 09/15/2018      Reactions   Metrogel [metronidazole]    Burned her face   Other    Dental Cement--tongue and cheek burning   Paxil [paroxetine Hcl]    Lethargic/felt bad   Prednisone    Turns skin red, heart racing   Zomig [zolmitriptan]    Elevated blood pressure      Medication List    STOP taking these medications   Baclofen 5 MG Tabs   mupirocin ointment 2 % Commonly known as:  BACTROBAN   traMADol 50 MG tablet Commonly known as:  ULTRAM   triamcinolone ointment 0.5 % Commonly known as:  KENALOG     TAKE these medications   ALPRAZolam 0.5 MG tablet Commonly known as:  XANAX Take 1 tablet (0.5 mg total) by mouth at bedtime as needed for anxiety.   atenolol 50 MG tablet Commonly known as:  TENORMIN Take 50 mg by mouth at bedtime.   cyclobenzaprine 5 MG tablet Commonly known as:  FLEXERIL Take 1 tablet (5 mg total) by mouth 3 (three) times daily as needed for muscle spasms.   HYDROcodone-acetaminophen 5-325 MG tablet Commonly known as:  NORCO Take 1-2 tablets by mouth every 4 (four) hours as needed for moderate pain or severe pain. What changed:  when to  take this   omeprazole 20 MG capsule Commonly known as:  PRILOSEC Take 1 capsule (20 mg total) by mouth 2 (two) times daily before a meal.   potassium chloride SA 20 MEQ tablet Commonly known as:  K-DUR,KLOR-CON TAKE 1 TABLET(20 MEQ) BY MOUTH TWICE DAILY What changed:  See the new instructions.   sulfamethoxazole-trimethoprim 800-160 MG tablet Commonly known as:  BACTRIM DS,SEPTRA DS Take 1 tablet by mouth 2 (two) times daily.   venlafaxine 37.5 MG tablet Commonly known as:  EFFEXOR Take 1 tablet (37.5 mg total) by mouth 2 (two) times daily.      Follow-up Information     Irene Limbo, MD In 1 week.   Specialty:  Plastic Surgery Why:  as scheduled Contact information: [address] [place] [address] [phone]        Fanny Skates, MD. Schedule an appointment as soon as possible for a visit in 3 week(s).   Specialty:  General Surgery Why:  Call and make an appointment to see Dr. Dalbert Batman in 2-1/2 to 3 weeks.  Sooner if there are any questions. Contact information: [address] [place] [address] [phone]           Signed: Irene Limbo 09/15/2018, 7:31 AM

## 2018-09-15 NOTE — Discharge Instructions (Signed)
Dr. Dalbert Batman will call the pathology report to you as soon as it is available. Most likely this will be completed by the end of this week.

## 2018-09-15 NOTE — Progress Notes (Signed)
1 Day Post-Op  Subjective: Alert and stable.  Having some incisional pain but controlled with analgesics.  Ambulated to bathroom.  No difficulty voiding.  Tolerating light diet. Total drainage 205 overnight.  Serosanguineous.    Objective: Vital signs in last 24 hours: Temp:  [97 F (36.1 C)-98.4 F (36.9 C)] 98.2 F (36.8 C) (02/19 0513) Pulse Rate:  [67-91] 73 (02/19 0513) Resp:  [9-22] 16 (02/19 0513) BP: (116-142)/(53-80) 133/78 (02/19 0513) SpO2:  [91 %-100 %] 98 % (02/19 0513) Weight:  [68.7 kg-69.8 kg] 68.7 kg (02/18 1430) Last BM Date: 09/14/18  Intake/Output from previous day: 02/18 0701 - 02/19 0700 In: 2390 [P.O.:840; I.V.:1400] Out: 2805 [Urine:2500; Drains:205; Blood:100] Intake/Output this shift: Total I/O In: 480 [P.O.:480] Out: 1590 [Urine:1500; Drains:90]  General appearance: Alert.  Pleasant.  No distress.  Skin warm and dry mental status normal Resp: clear to auscultation bilaterally Chest wall: no tenderness, Right nipple sparing mastectomy skin flaps look good.  Minimal ecchymoses.  No hematoma.  Axillary incision also looks good.  No axillary fluid or hematoma.  Lab Results:  Results for orders placed or performed during the hospital encounter of 09/14/18 (from the past 24 hour(s))  Pregnancy, urine POC     Status: None   Collection Time: 09/14/18  6:26 AM  Result Value Ref Range   Preg Test, Ur NEGATIVE NEGATIVE     Studies/Results: Nm Sentinel Node Inj-no Rpt (breast)  Result Date: 09/14/2018 Sulfur colloid was injected by the nuclear medicine technologist for melanoma sentinel node.   Mm Breast Surgical Specimen  Result Date: 09/14/2018 CLINICAL DATA:  Patient status post targeted right axillary node dissection. EXAM: SPECIMEN RADIOGRAPH OF THE RIGHT BREAST COMPARISON:  Previous exam(s). FINDINGS: Status post excision of the right axilla. The radioactive seed and biopsy marker clip are present, completely intact, and were marked for pathology.  IMPRESSION: Specimen radiograph of the right axilla. Electronically Signed   By: Lovey Newcomer M.D.   On: 09/14/2018 08:57    . atenolol  50 mg Oral QHS  . enoxaparin (LOVENOX) injection  40 mg Subcutaneous Q24H  . pantoprazole  40 mg Oral Daily  . venlafaxine  37.5 mg Oral BID     Assessment/Plan: s/p Procedure(s): RIGHT NIPPLE SPARING MASTECTOMY WITH TARGETED DEEP RIGHT AXILLARY LYMPH NODE BIOPSY WITH RADIOACTIVE SEED LOCALIZATION AND RIGHT AXILLARY SENTINEL LYMPH NODE BIOPSY, FROZEN SECTION AND POSSIBLE COMPLETION AXILLARY LYMPH NODE DISSECTION INJECT BLUE DYE RIGHT BREAST BREAST RECONSTRUCTION WITH PLACEMENT OF TISSUE EXPANDER AND ALLODERM  POD #1. Stable. Operative events and findings discussed with patient Wound care drain care and discharge planning per Dr. Iran Planas Quite possibly she can be discharged today if the morning goes well. I told her I would call the pathology report to her hopefully by the end of the week Follow-up with me in 3 weeks She already has follow-up arranged with oncology at the cancer center   @PROBHOSP @  LOS: 0 days    Claire Guzman 09/15/2018  . .prob

## 2018-09-22 DIAGNOSIS — Z9011 Acquired absence of right breast and nipple: Secondary | ICD-10-CM | POA: Insufficient documentation

## 2018-09-22 NOTE — Progress Notes (Signed)
Claire Guzman   [phone] Fax:([phone]) [phone]   Clinic Follow up Note   Patient Care Team: Lujean Amel, MD as PCP - General (Family Medicine) Fanny Skates, MD as Consulting Physician (General Surgery) Truitt Merle, MD as Consulting Physician (Hematology) Eppie Gibson, MD as Attending Physician (Radiation Oncology)  Date of Service:  09/24/2018  CHIEF COMPLAINT: F/u for right breast cancer  SUMMARY OF ONCOLOGIC HISTORY: Oncology History   Cancer Staging Malignant neoplasm of upper-inner quadrant of right breast in female, estrogen receptor positive ([place]) Staging form: Breast, AJCC 8th Edition - Clinical stage from 03/08/2018: Stage IB (cT2, cN1, cM0, G3, ER+, PR+, HER2+) - Signed by Truitt Merle, MD on 03/16/2018       Malignant neoplasm of upper-inner quadrant of right breast in female, estrogen receptor positive ([place])   02/24/2018 Mammogram    screening mammogram on 02/24/2018 that was benign    03/04/2018 Mammogram    diagnostic mammogram on 03/04/2018 due to a palpable mass on her right breast. Diagnostic mammogram, breast US revealed and biopsy revealed suspicious mass in the right breast at 2:30 at the palpable site of concern and one suspicious right axillary LN.    03/08/2018 Cancer Staging    Staging form: Breast, AJCC 8th Edition - Clinical stage from 03/08/2018: Stage IB (cT2, cN1, cM0, G3, ER+, PR+, HER2+) - Signed by Truitt Merle, MD on 03/16/2018    03/08/2018 Receptors her2    Her2 positive, ER 90% PR 5% and Ki67 60%    03/08/2018 Pathology Results    Diagnosis 1. Breast, right, needle core biopsy, 2:30 - INVASIVE DUCTAL CARCINOMA, GRADE 3. - LYMPHOVASCULAR SPACE INVOLVEMENT BY TUMOR. 2. Lymph node, needle/core biopsy, right axilla - METASTATIC CARCINOMA INVOLVING SCANT LYMPHOID TISSUE.    03/10/2018 Initial Diagnosis    Malignant neoplasm of upper-inner quadrant of right breast in female, estrogen receptor positive ([place])    03/20/2018 Imaging      MRI brain 03/20/18 IMPRESSION: No evidence of metastatic disease.  Normal appearance of brain. Slightly heterogeneous marrow pattern of the upper cervical spine, nonspecific but likely benign, especially in the setting of early stage disease.     03/24/2018 Echocardiogram    Baseline ECHO 03/24/18 Study Conclusions - Left ventricle: The cavity size was normal. Wall thickness was   increased in a pattern of mild LVH. Systolic function was normal.   The estimated ejection fraction was in the range of 60% to 65%.   Wall motion was normal; there were no regional wall motion   abnormalities. Doppler parameters are consistent with abnormal   left ventricular relaxation (grade 1 diastolic dysfunction). - Right ventricle: The cavity size was mildly dilated. Wall   thickness was normal.    03/25/2018 Imaging    MRI Breast B/l 03/25/18 IMPRESSION: 1. The patient's primary malignancy in the posterior right breast measures 3.2 x 2 x 2.6 cm with apparent invasion of the underlying pectoralis muscle. Numerous surrounding abnormal enhancing masses, consistent with satellite lesions, extend from 11 o'clock in the right breast into the inferolateral right breast with a total span of suspected disease measuring 4.5 x 4.6 x 6.4 cm in AP, transverse, and craniocaudal dimension. The suspected extent of disease involves the superior and inferolateral quadrants. The primary malignancy also extends just across midline into the superior medial quadrant. 2. No MRI evidence of malignancy in the left breast. 3. Known metastatic noted in the right axilla.    03/25/2018 Imaging    Whole body bone scan  03/25/18 IMPRESSION: No scintigraphic evidence skeletal metastasis.    03/25/2018 Imaging    CT CAP W Contrast 03/25/18 IMPRESSION: 1. Mass within the deep aspect of the right breast along the pectoralis muscle compatible with primary breast malignancy. 2. There is suggestion of two additional possible masses  within the lateral aspect of the right breast versus nodular appearing breast tissue. Recommend dedicated evaluation with bilateral breast MRI. 3. Mildly thickened right axillary lymph node compatible with metastatic adenopathy, recently biopsied. 4. Multiple bilateral pulmonary nodules are indeterminate, potentially sequelae of prior infectious/inflammatory process. Recommend attention on follow-up. Consider follow-up CT in 6 months. 5. Portal venous gas is demonstrated within the left hepatic lobe. Additionally, there is wall thickening of the cecum and ascending colon with small amount of gas within the pericolonic vasculature. Constellation of findings is indeterminate in etiology however may be secondary to colitis at this location with considerations including infectious, inflammatory or ischemic etiologies. Correlate for recent procedure. If patient is not up-to-date for colonic screening, recommend further evaluation with colonoscopy to exclude the possibility of colonic mass within the cecum/ascending colon. 6. These results were called by telephone at the time of interpretation on 03/25/2018 at 9:31 am to Dr. Truitt Merle , who verbally acknowledged these results.    03/30/2018 -  Chemotherapy    Neoadjuvant TCHP every 3 weeks starting 03/30/18. Completed 6 cycles of TCHP on 07/23/18.  Continued with maintenance Herceptin/Perjeta q3weeks to complete 1 year.     04/09/2018 Genetic Testing    Negative genetic testing on the [employer] Common Hereditary Cancers Panel. The Common Hereditary Cancers Panel offered by [facility] includes sequencing and/or deletion duplication testing of the following 47 genes: APC, ATM, AXIN2, BARD1, BMPR1A, BRCA1, BRCA2, BRIP1, CDH1, CDKN2A (p14ARF), CDKN2A (p16INK4a), CKD4, CHEK2, CTNNA1, DICER1, EPCAM (Deletion/duplication testing only), GREM1 (promoter region deletion/duplication testing only), KIT, MEN1, MLH1, MSH2, MSH3, MSH6, MUTYH, NBN, NF1, NHTL1, PALB2,  PDGFRA, PMS2, POLD1, POLE, PTEN, RAD50, RAD51C, RAD51D, SDHB, SDHC, SDHD, SMAD4, SMARCA4. STK11, TP53, TSC1, TSC2, and VHL.  The following genes were evaluated for sequence changes only: SDHA and HOXB13 c.251G>A variant only.   Genetic testing did detect a Variant of Unknown Significance (VUS) in the POLD1 gene called c.985C>T (p.Pro329Ser). At this time, it is unknown if this variant is associated with increased cancer risk or if this is a normal finding, but most variants such as this get reclassified to being inconsequential. It should not be used to make medical management decisions.   The report date is 04/09/2018.    07/24/2018 Breast MRI    IMPRESSION: 1. Resolution of the enhancing mass (known cancer) and small satellite lesions previously seen in the right breast following chemotherapy.  2. The metastatic lymph node in the right axilla has decreased in size.  3.  No MRI evidence of left breast malignancy.    09/14/2018 Surgery    RIGHT NIPPLE SPARING MASTECTOMY WITH TARGETED DEEP RIGHT AXILLARY LYMPH NODE BIOPSY WITH RADIOACTIVE SEED LOCALIZATION AND RIGHT AXILLARY SENTINEL LYMPH NODE BIOPSY by Dr. Dalbert Batman  09/14/18     09/14/2018 Pathology Results    Diagnosis 1. Lymph node, sentinel, biopsy, right axillary with radioactive seed - ONE OF ONE LYMPH NODES NEGATIVE FOR CARCINOMA (0/1). - MILD TREATMENT EFFECT. - BIOPSY SITE. 2. Lymph node, sentinel, biopsy, right axillary - ONE OF ONE LYMPH NODES NEGATIVE FOR CARCINOMA (0/1). 3. Lymph node, sentinel, biopsy, right - ONE OF ONE LYMPH NODES NEGATIVE FOR CARCINOMA (0/1). 4. Lymph node, sentinel, biopsy, right -  ONE OF ONE LYMPH NODES NEGATIVE FOR CARCINOMA (0/1). 5. Lymph node, sentinel, biopsy, right - ONE OF ONE LYMPH NODES NEGATIVE FOR CARCINOMA (0/1). 6. Lymph node, sentinel, biopsy, right - ONE OF ONE LYMPH NODES NEGATIVE FOR CARCINOMA (0/1). 7. Lymph node, sentinel, biopsy, right - ONE OF ONE LYMPH NODES NEGATIVE FOR  CARCINOMA (0/1). 8. Lymph node, sentinel, biopsy, right - ONE OF ONE LYMPH NODES NEGATIVE FOR CARCINOMA (0/1). 9. Lymph node, sentinel, biopsy, right - ONE OF ONE LYMPH NODES NEGATIVE FOR CARCINOMA (0/1). 10. Lymph node, sentinel, biopsy, right - ONE OF ONE LYMPH NODES NEGATIVE FOR CARCINOMA (0/1). 11. Lymph node, sentinel, biopsy, right - ONE OF ONE LYMPH NODES NEGATIVE FOR CARCINOMA (0/1). 12. Lymph node, sentinel, biopsy, right - ONE OF ONE LYMPH NODES NEGATIVE FOR CARCINOMA (0/1). 13. Nipple Biopsy, right - BENIGN NIPPLE TISSUE. 14. Breast, simple mastectomy, right - NO RESIDUAL CARCINOMA IDENTIFIED. - TREATMENT EFFECT. - FIBROCYSTIC CHANGE. - ONE OF ONE LYMPH NODES NEGATIVE FOR CARCINOMA (0/1). - SEE ONCOLOGY TABLE.    09/14/2018 Cancer Staging    Staging form: Breast, AJCC 8th Edition - Pathologic stage from 09/14/2018: No Stage Recommended (ypT0, pN0, cM0, GX, ER: Not Assessed, PR: Not Assessed, HER2: Not Assessed) - Signed by Truitt Merle, MD on 09/23/2018      CURRENT THERAPY:  Maintenance Herceptin/Perjeta q3weeks to complete 1 year treatment in 02/2019  INTERVAL HISTORY:  Claire Guzman is here for a follow up of right breast cancer post mastectomy. She presents to the clinic today with a family member. She notes her surgery went well. She has 1 draining tube remaining and she has been sore. She notes her nails are discolored but doing well.     REVIEW OF SYSTEMS:   Constitutional: Denies fevers, chills or abnormal weight loss Eyes: Denies blurriness of vision Ears, nose, mouth, throat, and face: Denies mucositis or sore throat Respiratory: Denies cough, dyspnea or wheezes Cardiovascular: Denies palpitation, chest discomfort or lower extremity swelling Gastrointestinal:  Denies nausea, heartburn or change in bowel habits Skin: Denies abnormal skin rashes (+) Nail discoloration  Lymphatics: Denies new lymphadenopathy or easy bruising Neurological:Denies numbness,  tingling or new weaknesses Behavioral/Psych: Mood is stable, no new changes  Breast; (+) Right breast soreness All other systems were reviewed with the patient and are negative.  MEDICAL HISTORY:  Past Medical History:  Diagnosis Date  . Abdominal pain, unspecified site   . Anxiety state, unspecified   . Cancer [facility])    R breast cancer  . Complication of anesthesia    convulsions - when she has appendectomy about 12 years ago at [facility]  . Contact dermatitis and other eczema, due to unspecified cause   . Essential hypertension, benign   . Family history of colon cancer   . Family history of melanoma   . Family history of prostate cancer   . GERD (gastroesophageal reflux disease)   . Hematuria, unspecified   . Hypertension   . Lower back pain   . Mixed hyperlipidemia   . Rosacea   . Unspecified symptom associated with female genital organs     SURGICAL HISTORY: Past Surgical History:  Procedure Laterality Date  . BREAST RECONSTRUCTION WITH PLACEMENT OF TISSUE EXPANDER AND ALLODERM Right 09/14/2018   Procedure: BREAST RECONSTRUCTION WITH PLACEMENT OF TISSUE EXPANDER AND ALLODERM;  Surgeon: Irene Limbo, MD;  Location: [place];  Service: Plastics;  Laterality: Right;  . CESAREAN SECTION    . DIAGNOSTIC LAPAROSCOPY    .  EYE SURGERY     lasix  . LAPAROSCOPIC APPENDECTOMY    . MASTECTOMY WITH RADIOACTIVE SEED GUIDED EXCISION AND AXILLARY SENTINEL LYMPH NODE BIOPSY Right 09/14/2018   Procedure: RIGHT NIPPLE SPARING MASTECTOMY WITH TARGETED DEEP RIGHT AXILLARY LYMPH NODE BIOPSY WITH RADIOACTIVE SEED LOCALIZATION AND RIGHT AXILLARY SENTINEL LYMPH NODE BIOPSY, FROZEN SECTION AND POSSIBLE COMPLETION AXILLARY LYMPH NODE DISSECTION INJECT BLUE DYE RIGHT BREAST;  Surgeon: Fanny Skates, MD;  Location: [place];  Service: General;  Laterality: Right;  . PORTACATH PLACEMENT Left 04/13/2018   Procedure: INSERTION PORT-A-CATH WITH [place];  Surgeon: Fanny Skates, MD;  Location: [place];  Service: General;  Laterality: Left;  . UTERINE FIBROID SURGERY      I have reviewed the social history and family history with the patient and they are unchanged from previous note.  ALLERGIES:  is allergic to metrogel [metronidazole]; other; paxil [paroxetine hcl]; prednisone; and zomig [zolmitriptan].  MEDICATIONS:  Current Outpatient Medications  Medication Sig Dispense Refill  . ALPRAZolam (XANAX) 0.5 MG tablet Take 1 tablet (0.5 mg total) by mouth at bedtime as needed for anxiety. 30 tablet 0  . atenolol (TENORMIN) 50 MG tablet Take 50 mg by mouth at bedtime.    . cyclobenzaprine (FLEXERIL) 5 MG tablet Take 1 tablet (5 mg total) by mouth 3 (three) times daily as needed for muscle spasms. 30 tablet 0  . HYDROcodone-acetaminophen (NORCO) 5-325 MG tablet Take 1-2 tablets by mouth every 4 (four) hours as needed for moderate pain or severe pain. 40 tablet 0  . omeprazole (PRILOSEC) 20 MG capsule Take 1 capsule (20 mg total) by mouth 2 (two) times daily before a meal. 60 capsule 2  . potassium chloride SA (K-DUR,KLOR-CON) 20 MEQ tablet TAKE 1 TABLET(20 MEQ) BY MOUTH TWICE DAILY (Patient taking differently: Take 20 mEq by mouth 2 (two) times daily. ) 30 tablet 0  . sulfamethoxazole-trimethoprim (BACTRIM DS,SEPTRA DS) 800-160 MG tablet Take 1 tablet by mouth 2 (two) times daily. 12 tablet 0  . venlafaxine (EFFEXOR) 37.5 MG tablet Take 1 tablet (37.5 mg total) by mouth 2 (two) times daily. 30 tablet 3   No current facility-administered medications for this visit.    Facility-Administered Medications Ordered in Other Visits  Medication Dose Route Frequency Provider Last Rate Last Dose  . sodium chloride flush (NS) 0.9 % injection 10 mL  10 mL Intracatheter PRN Truitt Merle, MD   10 mL at 09/24/18 1309    PHYSICAL EXAMINATION: ECOG PERFORMANCE STATUS: 2 - Symptomatic, <50% confined to bed  Vitals:   09/24/18 1007  BP: (!) 119/58  Pulse: 85  Resp: 18  Temp: 98.1 F (36.7  C)  SpO2: 98%   Filed Weights   09/24/18 1007  Weight: 156 lb 3.2 oz (70.9 kg)    GENERAL:alert, no distress and comfortable SKIN: skin color, texture, turgor are normal, no rashes or significant lesions EYES: normal, Conjunctiva are pink and non-injected, sclera clear OROPHARYNX:no exudate, no erythema and lips, buccal mucosa, and tongue normal  NECK: supple, thyroid normal size, non-tender, without nodularity LYMPH:  no palpable lymphadenopathy in the cervical, axillary or inguinal LUNGS: clear to auscultation and percussion with normal breathing effort HEART: regular rate & rhythm and no murmurs and no lower extremity edema ABDOMEN:abdomen soft, non-tender and normal bowel sounds Musculoskeletal:no cyanosis of digits and no clubbing  NEURO: alert & oriented x 3 with fluent speech, no focal motor/sensory deficits BREAST: S/p right mastectomy: Tissue expander in place, Draining tube in place,  surgical incisions healing well.   LABORATORY DATA:  I have reviewed the data as listed CBC Latest Ref Rng & Units 09/24/2018 09/06/2018 09/03/2018  WBC 4.0 - 10.5 K/uL 5.4 5.7 4.7  Hemoglobin 12.0 - 15.0 g/dL 11.0(L) 11.9(L) 12.3  Hematocrit 36.0 - 46.0 % 31.3(L) 35.5(L) 34.6(L)  Platelets 150 - 400 K/uL 201 137(L) 130(L)     CMP Latest Ref Rng & Units 09/24/2018 09/06/2018 09/03/2018  Glucose 70 - 99 mg/dL 112(H) 106(H) 144(H)  BUN 6 - 20 mg/dL 11 10 11   Creatinine 0.44 - 1.00 mg/dL 0.67 0.58 0.70  Sodium 135 - 145 mmol/L 142 143 143  Potassium 3.5 - 5.1 mmol/L 3.5 3.5 3.4(L)  Chloride 98 - 111 mmol/L 104 107 108  CO2 22 - 32 mmol/L 26 25 24   Calcium 8.9 - 10.3 mg/dL 9.8 9.3 9.7  Total Protein 6.5 - 8.1 g/dL 7.3 7.0 7.2  Total Bilirubin 0.3 - 1.2 mg/dL 0.5 0.7 0.8  Alkaline Phos 38 - 126 U/L 118 84 106  AST 15 - 41 U/L 20 24 19   ALT 0 - 44 U/L 10 18 14       RADIOGRAPHIC STUDIES: I have personally reviewed the radiological images as listed and agreed with the findings in the  report. No results found.   ASSESSMENT & PLAN:  CELITA ARON is a 53 y.o. female with   1.Malignant neoplasm of upper-inner quadrant of right breast in female, invasive ductal carcinoma, cT2N1M0 stage IB, ER+/PR+/HER2+, G3, ypT0N0 -She was diagnosed in 02/2018. Given the size of her mass and LN involvement, which predicts highrisk of recurrence, she received neoadjuvant chemoof 6 cycles TCHP.  -She underwent right breast mastectomy and axillary lymph node dissection on 09/14/18. We discussed her pathology results which shows completes pathological response, no residual tumor, or lymph nodes negative.    -I discussed that complete pathological response predict good outcome, her risk of recurrence is small. -She will continue her Herceptin and Perjeta (due to node positive disease at diagnosis) to complete 1 year of treatment in 02/2019. She is tolerating well with mild diarrhea.  -Given positive lymph nodes prior to surgery she will still benefit from adjuvant postmastectomy radiation to reduce risk of local recurrence.  -She plans to proceed with breast reconstruction with Dr. Iran Planas after radiation.  -Given her ER/PR positive disease she will proceed with anti-estrogen therapy after radiation. Will discuss further at next visit.  -Labs reviewed, CBC WNL except mild anemia with hg at 11, CMP WNL. Overall adequate to proceed with Herceptin/Perjeta today and every 3 weeks.  -f/u in 6 weeks.    2. Genetics showed no pathogenic mutations.   3. Anxiety  -She has history of anxiety, currently on Xanax as needed -Stable   4. HTN -She will continue atenolol.  -BP is well controlled, 119/58 today (09/24/18)   5. Bilateral small lung nodules -Her staging CT scan showed bilateral multiple small lung nodules, 3 to 4 mm, indeterminate, likely benign. She is a never smoker -Plan tomonitorrepeat CT chest without contrastin 3-6 months  6. Hand and leg edema  -Mild, and  symmetric, likely related to steroids and chemo, unlikely DVT. -She tried Lasix 20 mg dailyto twice daily,did not help her much. -I encouraged her to use compression stockingsduring the day -overall improved     Plan: -Lab reviewed, will proceed with Herceptin/Perjeta today, and continue every 3 weeks -Lab, flush, F/u and treatment in 6 weeks -rad/onc referral    No problem-specific Assessment &  Plan notes found for this encounter.   No orders of the defined types were placed in this encounter.  All questions were answered. The patient knows to call the clinic with any problems, questions or concerns. No barriers to learning was detected. I spent 20 minutes counseling the patient face to face. The total time spent in the appointment was 25 minutes and more than 50% was on counseling and review of test results     Truitt Merle, MD 09/24/2018   I, Joslyn Devon, am acting as scribe for Truitt Merle, MD.   I have reviewed the above documentation for accuracy and completeness, and I agree with the above.

## 2018-09-24 ENCOUNTER — Inpatient Hospital Stay: Payer: BLUE CROSS/BLUE SHIELD

## 2018-09-24 ENCOUNTER — Encounter: Payer: Self-pay | Admitting: Hematology

## 2018-09-24 ENCOUNTER — Inpatient Hospital Stay (HOSPITAL_BASED_OUTPATIENT_CLINIC_OR_DEPARTMENT_OTHER): Payer: BLUE CROSS/BLUE SHIELD | Admitting: Hematology

## 2018-09-24 ENCOUNTER — Telehealth: Payer: Self-pay | Admitting: Hematology

## 2018-09-24 VITALS — BP 140/79 | HR 80 | Temp 98.0°F | Resp 18

## 2018-09-24 VITALS — BP 119/58 | HR 85 | Temp 98.1°F | Resp 18 | Ht 64.0 in | Wt 156.2 lb

## 2018-09-24 DIAGNOSIS — R600 Localized edema: Secondary | ICD-10-CM | POA: Diagnosis not present

## 2018-09-24 DIAGNOSIS — Z9221 Personal history of antineoplastic chemotherapy: Secondary | ICD-10-CM | POA: Diagnosis not present

## 2018-09-24 DIAGNOSIS — Z170 Estrogen receptor positive status [ER+]: Secondary | ICD-10-CM | POA: Diagnosis not present

## 2018-09-24 DIAGNOSIS — I10 Essential (primary) hypertension: Secondary | ICD-10-CM | POA: Diagnosis not present

## 2018-09-24 DIAGNOSIS — C50211 Malignant neoplasm of upper-inner quadrant of right female breast: Secondary | ICD-10-CM

## 2018-09-24 DIAGNOSIS — F419 Anxiety disorder, unspecified: Secondary | ICD-10-CM | POA: Diagnosis not present

## 2018-09-24 DIAGNOSIS — Z9011 Acquired absence of right breast and nipple: Secondary | ICD-10-CM

## 2018-09-24 DIAGNOSIS — Z5112 Encounter for antineoplastic immunotherapy: Secondary | ICD-10-CM | POA: Diagnosis not present

## 2018-09-24 DIAGNOSIS — Z95828 Presence of other vascular implants and grafts: Secondary | ICD-10-CM

## 2018-09-24 DIAGNOSIS — Z79899 Other long term (current) drug therapy: Secondary | ICD-10-CM

## 2018-09-24 DIAGNOSIS — C773 Secondary and unspecified malignant neoplasm of axilla and upper limb lymph nodes: Secondary | ICD-10-CM | POA: Diagnosis not present

## 2018-09-24 DIAGNOSIS — R918 Other nonspecific abnormal finding of lung field: Secondary | ICD-10-CM

## 2018-09-24 LAB — CBC WITH DIFFERENTIAL (CANCER CENTER ONLY)
Abs Immature Granulocytes: 0.02 10*3/uL (ref 0.00–0.07)
BASOS ABS: 0 10*3/uL (ref 0.0–0.1)
Basophils Relative: 0 %
Eosinophils Absolute: 0.2 10*3/uL (ref 0.0–0.5)
Eosinophils Relative: 3 %
HCT: 31.3 % — ABNORMAL LOW (ref 36.0–46.0)
Hemoglobin: 11 g/dL — ABNORMAL LOW (ref 12.0–15.0)
Immature Granulocytes: 0 %
Lymphocytes Relative: 25 %
Lymphs Abs: 1.4 10*3/uL (ref 0.7–4.0)
MCH: 33.7 pg (ref 26.0–34.0)
MCHC: 35.1 g/dL (ref 30.0–36.0)
MCV: 96 fL (ref 80.0–100.0)
Monocytes Absolute: 0.4 10*3/uL (ref 0.1–1.0)
Monocytes Relative: 7 %
NRBC: 0 % (ref 0.0–0.2)
Neutro Abs: 3.4 10*3/uL (ref 1.7–7.7)
Neutrophils Relative %: 65 %
Platelet Count: 201 10*3/uL (ref 150–400)
RBC: 3.26 MIL/uL — ABNORMAL LOW (ref 3.87–5.11)
RDW: 11.8 % (ref 11.5–15.5)
WBC Count: 5.4 10*3/uL (ref 4.0–10.5)

## 2018-09-24 LAB — CMP (CANCER CENTER ONLY)
ALT: 10 U/L (ref 0–44)
AST: 20 U/L (ref 15–41)
Albumin: 3.8 g/dL (ref 3.5–5.0)
Alkaline Phosphatase: 118 U/L (ref 38–126)
Anion gap: 12 (ref 5–15)
BUN: 11 mg/dL (ref 6–20)
CO2: 26 mmol/L (ref 22–32)
Calcium: 9.8 mg/dL (ref 8.9–10.3)
Chloride: 104 mmol/L (ref 98–111)
Creatinine: 0.67 mg/dL (ref 0.44–1.00)
GFR, Estimated: >60 mL/min (ref >60)
Glucose, Bld: 112 mg/dL — ABNORMAL HIGH (ref 70–99)
Potassium: 3.5 mmol/L (ref 3.5–5.1)
SODIUM: 142 mmol/L (ref 135–145)
Total Bilirubin: 0.5 mg/dL (ref 0.3–1.2)
Total Protein: 7.3 g/dL (ref 6.5–8.1)

## 2018-09-24 MED ORDER — HEPARIN SOD (PORK) LOCK FLUSH 100 UNIT/ML IV SOLN
500.0000 [IU] | Freq: Once | INTRAVENOUS | Status: AC | PRN
  Administered 2018-09-24: 500 [IU]
  Filled 2018-09-24: qty 5

## 2018-09-24 MED ORDER — DIPHENHYDRAMINE HCL 25 MG PO CAPS
50.0000 mg | ORAL_CAPSULE | Freq: Once | ORAL | Status: AC
  Administered 2018-09-24: 50 mg via ORAL

## 2018-09-24 MED ORDER — SODIUM CHLORIDE 0.9% FLUSH
10.0000 mL | INTRAVENOUS | Status: DC | PRN
  Administered 2018-09-24: 10 mL
  Filled 2018-09-24: qty 10

## 2018-09-24 MED ORDER — TRASTUZUMAB CHEMO 150 MG IV SOLR
450.0000 mg | Freq: Once | INTRAVENOUS | Status: AC
  Administered 2018-09-24: 450 mg via INTRAVENOUS
  Filled 2018-09-24: qty 21.43

## 2018-09-24 MED ORDER — DIPHENHYDRAMINE HCL 25 MG PO CAPS
ORAL_CAPSULE | ORAL | Status: AC
  Filled 2018-09-24: qty 2

## 2018-09-24 MED ORDER — SODIUM CHLORIDE 0.9 % IV SOLN
Freq: Once | INTRAVENOUS | Status: AC
  Administered 2018-09-24: 11:00:00 via INTRAVENOUS
  Filled 2018-09-24: qty 250

## 2018-09-24 MED ORDER — SODIUM CHLORIDE 0.9 % IV SOLN
420.0000 mg | Freq: Once | INTRAVENOUS | Status: AC
  Administered 2018-09-24: 420 mg via INTRAVENOUS
  Filled 2018-09-24: qty 14

## 2018-09-24 MED ORDER — ACETAMINOPHEN 325 MG PO TABS
650.0000 mg | ORAL_TABLET | Freq: Once | ORAL | Status: AC
  Administered 2018-09-24: 650 mg via ORAL

## 2018-09-24 MED ORDER — ACETAMINOPHEN 325 MG PO TABS
ORAL_TABLET | ORAL | Status: AC
  Filled 2018-09-24: qty 2

## 2018-09-24 NOTE — Patient Instructions (Signed)
[facility] Discharge Instructions for Patients Receiving Chemotherapy  Today you received the following chemotherapy agents :  Herceptin, Perjeta.  To help prevent nausea and vomiting after your treatment, we encourage you to take your nausea medication as prescribed.   If you develop nausea and vomiting that is not controlled by your nausea medication, call the clinic.   BELOW ARE SYMPTOMS THAT SHOULD BE REPORTED IMMEDIATELY:  *FEVER GREATER THAN 100.5 F  *CHILLS WITH OR WITHOUT FEVER  NAUSEA AND VOMITING THAT IS NOT CONTROLLED WITH YOUR NAUSEA MEDICATION  *UNUSUAL SHORTNESS OF BREATH  *UNUSUAL BRUISING OR BLEEDING  TENDERNESS IN MOUTH AND THROAT WITH OR WITHOUT PRESENCE OF ULCERS  *URINARY PROBLEMS  *BOWEL PROBLEMS  UNUSUAL RASH Items with * indicate a potential emergency and should be followed up as soon as possible.  Feel free to call the clinic should you have any questions or concerns. The clinic phone number is [phone].  Please show the CHEMO ALERT CARD at check-in to the Emergency Department and triage nurse.   

## 2018-09-24 NOTE — Progress Notes (Signed)
Refused to stay for 30 minute observation after Perjeta. Vital signs obtained and d/ced home.

## 2018-09-24 NOTE — Telephone Encounter (Signed)
Scheduled per los, declined printout  ° °

## 2018-10-12 ENCOUNTER — Encounter: Payer: Self-pay | Admitting: Physical Therapy

## 2018-10-12 ENCOUNTER — Other Ambulatory Visit: Payer: Self-pay

## 2018-10-12 ENCOUNTER — Ambulatory Visit: Payer: BLUE CROSS/BLUE SHIELD | Attending: Plastic Surgery | Admitting: Physical Therapy

## 2018-10-12 DIAGNOSIS — R293 Abnormal posture: Secondary | ICD-10-CM | POA: Insufficient documentation

## 2018-10-12 DIAGNOSIS — M25611 Stiffness of right shoulder, not elsewhere classified: Secondary | ICD-10-CM | POA: Diagnosis not present

## 2018-10-12 DIAGNOSIS — M25511 Pain in right shoulder: Secondary | ICD-10-CM | POA: Insufficient documentation

## 2018-10-12 DIAGNOSIS — Z483 Aftercare following surgery for neoplasm: Secondary | ICD-10-CM | POA: Insufficient documentation

## 2018-10-12 NOTE — Therapy (Signed)
[place], [place], [address] Phone: [phone]   Fax:  [phone]  Physical Therapy Evaluation  Patient Details  Name: Claire Guzman MRN: [id number] Date of Birth: [birth year] Referring Provider (PT): Dr. Iran Planas    Encounter Date: 10/12/2018  PT End of Session - 10/12/18 1818    Visit Number  1    Number of Visits  9    PT Start Time  1300    PT Stop Time  1345    PT Time Calculation (min)  45 min    Activity Tolerance  Patient tolerated treatment well    Behavior During Therapy  [facility] for tasks assessed/performed       Past Medical History:  Diagnosis Date  . Abdominal pain, unspecified site   . Anxiety state, unspecified   . Cancer [facility])    R breast cancer  . Complication of anesthesia    convulsions - when she has appendectomy about 12 years ago at [facility]  . Contact dermatitis and other eczema, due to unspecified cause   . Essential hypertension, benign   . Family history of colon cancer   . Family history of melanoma   . Family history of prostate cancer   . GERD (gastroesophageal reflux disease)   . Hematuria, unspecified   . Hypertension   . Lower back pain   . Mixed hyperlipidemia   . Rosacea   . Unspecified symptom associated with female genital organs     Past Surgical History:  Procedure Laterality Date  . BREAST RECONSTRUCTION WITH PLACEMENT OF TISSUE EXPANDER AND ALLODERM Right 09/14/2018   Procedure: BREAST RECONSTRUCTION WITH PLACEMENT OF TISSUE EXPANDER AND ALLODERM;  Surgeon: Irene Limbo, MD;  Location: [place];  Service: Plastics;  Laterality: Right;  . CESAREAN SECTION    . DIAGNOSTIC LAPAROSCOPY    . EYE SURGERY     lasix  . LAPAROSCOPIC APPENDECTOMY    . MASTECTOMY WITH RADIOACTIVE SEED GUIDED EXCISION AND AXILLARY SENTINEL LYMPH NODE BIOPSY Right 09/14/2018   Procedure: RIGHT NIPPLE SPARING MASTECTOMY WITH TARGETED DEEP RIGHT AXILLARY LYMPH NODE  BIOPSY WITH RADIOACTIVE SEED LOCALIZATION AND RIGHT AXILLARY SENTINEL LYMPH NODE BIOPSY, FROZEN SECTION AND POSSIBLE COMPLETION AXILLARY LYMPH NODE DISSECTION INJECT BLUE DYE RIGHT BREAST;  Surgeon: Fanny Skates, MD;  Location: [place];  Service: General;  Laterality: Right;  . PORTACATH PLACEMENT Left 04/13/2018   Procedure: INSERTION PORT-A-CATH WITH [place];  Surgeon: Fanny Skates, MD;  Location: [place];  Service: General;  Laterality: Left;  . UTERINE FIBROID SURGERY      There were no vitals filed for this visit.   Subjective Assessment - 10/12/18 1309    Subjective  pt states it is prickly feeling under her arm and she is having pain in her shoulder joint when she raises her arm to 90 and has pain at drain tube sites, and its difficult to wear a bra all day  She is down to one pain pill a day if that.  She still has difficult putting on a coat  she says that she has some diffuse joint pain     Pertinent History  Pt diagnosed with breast cancer 02/24/2018 ( HER2+, ER90%, PR5%, [id number]%) she underwent neoadjuvant chemotherapy and had nipple sparing mastectomy with targeted axillary node biopsy with immediate expander tissue expander 09/24/2018  She will be having radiation that may be starting in mid April     Patient Stated Goals  To get her  shoulder range of motion back     Currently in Pain?  No/denies         [facility] PT Assessment - 10/12/18 0001      Assessment   Medical Diagnosis  right breast cancer     Referring Provider (PT)  Dr. Iran Planas     Onset Date/Surgical Date  02/24/18    Hand Dominance  Right      Precautions   Precautions  None      Restrictions   Weight Bearing Restrictions  No      Balance Screen   Has the patient fallen in the past 6 months  No    Has the patient had a decrease in activity level because of a fear of falling?   No    Is the patient reluctant to leave their home because of a fear of falling?   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 Pt states she had some difficulty with  nipple healing     Quick DASH   45.45      Sensation   Light Touch  Not tested      Coordination   Gross Motor Movements are Fluid and Coordinated  Yes      Posture/Postural Control   Posture/Postural Control  Postural limitations    Postural Limitations  Rounded Shoulders;Forward head      ROM / Strength   AROM / PROM / Strength  AROM      AROM   Overall AROM   Deficits    Overall AROM Comments  limited by pain from sugery     Right Shoulder Extension  60 Degrees    Right Shoulder Flexion  137 Degrees    Right Shoulder ABduction  110 Degrees    Right Shoulder Internal Rotation  60 Degrees    Right Shoulder External Rotation  90 Degrees        LYMPHEDEMA/ONCOLOGY QUESTIONNAIRE - 10/12/18 1327      Right Upper Extremity Lymphedema   10 cm Proximal to Olecranon Process  29 cm    Olecranon Process  26 cm    15 cm Proximal to Ulnar Styloid Process  26.3 cm    10 cm Proximal to Ulnar Styloid Process  24.5 cm    Just Proximal to Ulnar Styloid Process  18 cm    Across Hand at [employer]  19.5 cm    At [place] of 2nd Digit  7 cm      Left Upper Extremity Lymphedema   10 cm Proximal to  Olecranon Process  29.5 cm    Olecranon Process  26.5 cm    15 cm Proximal to Ulnar Styloid Process  26 cm    10 cm Proximal to Ulnar Styloid Process  24.9 cm    Just Proximal to Ulnar Styloid Process  18 cm    Across Hand at [employer]  19.1 cm    At [place] of 2nd Digit  7 cm          Quick Dash - 10/12/18 0001    Open a tight or new jar  Mild difficulty    Do heavy household chores (wash walls, wash floors)  Moderate difficulty    Carry a shopping bag or briefcase  Mild difficulty    Wash your back  Severe difficulty    Use a knife to cut food  Mild difficulty    Recreational activities in which you take some force or impact through your arm, shoulder, or hand (golf, hammering, tennis)  Unable    During the past week, to what extent has your arm, shoulder or hand problem interfered with your normal social activities with family, friends, neighbors, or groups?  Modererately    During the past week, to what extent has your arm, shoulder or hand problem limited your work or other regular daily activities  Modererately    Arm, shoulder, or hand pain.  Moderate    Tingling (pins and needles) in your arm, shoulder, or hand  Moderate    Difficulty Sleeping  No difficulty    DASH Score  45.45 %        Objective measurements completed on examination: See above findings.      [facility] Adult PT Treatment/Exercise - 10/12/18 0001      Exercises   Exercises  Shoulder      Shoulder Exercises: Supine   Other Supine Exercises  instructed in supine dowel shoulder flexion, abduction, external rotation and butterfly stretch              PT Education - 10/12/18 1817    Education Details  shoulder stretches     Person(s) Educated  Patient    Methods  Explanation;Demonstration;Handout    Comprehension  Verbalized understanding;Returned demonstration          PT Long Term Goals - 10/12/18 1824      PT LONG TERM GOAL #1   Title  Pt will increase right shouldre abduction to >  135 degrees so that she can easily receive radiation therapy     Baseline  110    Time  4    Period  Weeks    Status  New      PT LONG TERM GOAL #2   Title  Pt will be independent in a home program for range of motion and strength    Time  4    Period  Weeks    Status  New      PT LONG TERM GOAL #3   Title  Pt will  verbalize lymphedema risk reduction precautions     Time  4    Period  Weeks    Status  New             Plan - 10/12/18 1819    Clinical Impression Statement  53 yo female about 1 month post mastectomy with targeted sentinel node removed who is preparing to received radiation.  She has limited right shoulder ROM and strength and no signs of lymphedema  Personal Factors and Comorbidities  Comorbidity 1;Past/Current Experience;Finances    Comorbidities  previous chemotherapy , recently lost her job and is getting ready to start a new job     Examination-Activity Limitations  Reach Overhead;Dressing    Examination-Participation Restrictions  Personal Finances    Stability/Clinical Decision Making  Evolving/Moderate complexity   will have radiation    Clinical Decision Making  Moderate    Rehab Potential  Good    PT Frequency  2x / week    PT Duration  4 weeks    PT Treatment/Interventions  ADLs/Self Care Home Management;Therapeutic activities;Therapeutic exercise;Neuromuscular re-education;Patient/family education;Manual techniques;Passive range of motion    PT Next Visit Plan  P/AA/AROM to right shoulder with manual techniques as needed, upgrad HEP as needed     PT Home Exercise Plan  supine dowel exercise     Consulted and Agree with Plan of Care  Patient       Patient will benefit from skilled therapeutic intervention in order to improve the following deficits and impairments:  Decreased activity tolerance, Decreased knowledge of precautions, Decreased knowledge of use of DME, Decreased strength, Increased fascial restricitons, Pain, Postural dysfunction,  Impaired UE functional use, Impaired perceived functional ability, Decreased range of motion  Visit Diagnosis: Aftercare following surgery for neoplasm - Plan: PT plan of care cert/re-cert  Abnormal posture - Plan: PT plan of care cert/re-cert  Stiffness of right shoulder joint - Plan: PT plan of care cert/re-cert  Acute pain of right shoulder - Plan: PT plan of care cert/re-cert     Problem List Patient Active Problem List   Diagnosis Date Noted  . Breast cancer, right ([place]) 09/14/2018  . Port-A-Cath in place 05/11/2018  . Genetic testing 04/12/2018  . Family history of prostate cancer   . Family history of colon cancer   . Family history of melanoma   . Malignant neoplasm of upper-inner quadrant of right breast in female, estrogen receptor positive ([place]) 03/10/2018   Donato Heinz. Owens Shark PT  [place] 10/12/2018, 6:27 PM  [place], [place], [address] Phone: [phone]   Fax:  [phone]  Name: STEPHENY CANAL MRN: [id number] Date of Birth: [birth year]

## 2018-10-12 NOTE — Patient Instructions (Signed)

## 2018-10-14 ENCOUNTER — Encounter: Payer: Self-pay | Admitting: Physical Therapy

## 2018-10-14 ENCOUNTER — Other Ambulatory Visit: Payer: Self-pay

## 2018-10-14 ENCOUNTER — Ambulatory Visit: Payer: BLUE CROSS/BLUE SHIELD | Admitting: Physical Therapy

## 2018-10-14 DIAGNOSIS — Z483 Aftercare following surgery for neoplasm: Secondary | ICD-10-CM | POA: Diagnosis not present

## 2018-10-14 DIAGNOSIS — R293 Abnormal posture: Secondary | ICD-10-CM | POA: Diagnosis not present

## 2018-10-14 DIAGNOSIS — M25611 Stiffness of right shoulder, not elsewhere classified: Secondary | ICD-10-CM | POA: Diagnosis not present

## 2018-10-14 DIAGNOSIS — M25511 Pain in right shoulder: Secondary | ICD-10-CM

## 2018-10-14 NOTE — Patient Instructions (Signed)
Deep Effective Breath   Standing, sitting, or laying down, place both hands on the belly. Take a deep breath IN, expanding the belly; then breath OUT, contracting the belly. Repeat __5__ times. Do __2-3__ sessions per day and before your self massage.  http://gt2.exer.us/866     Copyright  VHI. All rights reserved.  Axilla to Inguinal Nodes - Sweep   On involved side, make 10 circles at groin at panty line, then pump (stretching skin downwards) for 15 minutes from armpit along side of trunk to outer hip, making your other pathway. It may be helpful to lie on your side when you do this. Do __2_ time per day. Then repeat circles in groin.   Copyright  VHI. All rights reserved.

## 2018-10-14 NOTE — Therapy (Signed)
[place], [place], [address] Phone: [phone]   Fax:  [phone]  Physical Therapy Treatment  Patient Details  Name: Claire Guzman MRN: [id number] Date of Birth: [birth year] Referring Provider (PT): Dr. Iran Planas    Encounter Date: 10/14/2018  PT End of Session - 10/14/18 1203    Visit Number  2    Number of Visits  9    PT Start Time  0939    PT Stop Time  1015    PT Time Calculation (min)  36 min    Activity Tolerance  Patient tolerated treatment well    Behavior During Therapy  [facility] for tasks assessed/performed       Past Medical History:  Diagnosis Date  . Abdominal pain, unspecified site   . Anxiety state, unspecified   . Cancer [facility])    R breast cancer  . Complication of anesthesia    convulsions - when she has appendectomy about 12 years ago [facility]  . Contact dermatitis and other eczema, due to unspecified cause   . Essential hypertension, benign   . Family history of colon cancer   . Family history of melanoma   . Family history of prostate cancer   . GERD (gastroesophageal reflux disease)   . Hematuria, unspecified   . Hypertension   . Lower back pain   . Mixed hyperlipidemia   . Rosacea   . Unspecified symptom associated with female genital organs     Past Surgical History:  Procedure Laterality Date  . BREAST RECONSTRUCTION WITH PLACEMENT OF TISSUE EXPANDER AND ALLODERM Right 09/14/2018   Procedure: BREAST RECONSTRUCTION WITH PLACEMENT OF TISSUE EXPANDER AND ALLODERM;  Surgeon: Irene Limbo, MD;  Location: [place];  Service: Plastics;  Laterality: Right;  . CESAREAN SECTION    . DIAGNOSTIC LAPAROSCOPY    . EYE SURGERY     lasix  . LAPAROSCOPIC APPENDECTOMY    . MASTECTOMY WITH RADIOACTIVE SEED GUIDED EXCISION AND AXILLARY SENTINEL LYMPH NODE BIOPSY Right 09/14/2018   Procedure: RIGHT NIPPLE SPARING MASTECTOMY WITH TARGETED DEEP RIGHT AXILLARY LYMPH NODE  BIOPSY WITH RADIOACTIVE SEED LOCALIZATION AND RIGHT AXILLARY SENTINEL LYMPH NODE BIOPSY, FROZEN SECTION AND POSSIBLE COMPLETION AXILLARY LYMPH NODE DISSECTION INJECT BLUE DYE RIGHT BREAST;  Surgeon: Fanny Skates, MD;  Location: [place];  Service: General;  Laterality: Right;  . PORTACATH PLACEMENT Left 04/13/2018   Procedure: INSERTION PORT-A-CATH WITH [place];  Surgeon: Fanny Skates, MD;  Location: [place];  Service: General;  Laterality: Left;  . UTERINE FIBROID SURGERY      There were no vitals filed for this visit.  Subjective Assessment - 10/14/18 0940    Subjective  I still can not lie on that side. I went walking yesterday with my dog and she pulls and it does fine. I have to do radiation after I finish healing.     Pertinent History  Pt diagnosed with breast cancer 02/24/2018 ( HER2+, ER90%, PR5%, Ki6760%) she underwent neoadjuvant chemotherapy and had nipple sparing mastectomy with targeted axillary node biopsy with immediate expander tissue expander 09/24/2018  She will be having radiation that may be starting in mid April     Patient Stated Goals  To get her shoulder range of motion back     Currently in Pain?  No/denies    Pain Score  0-No pain         [facility] PT Assessment - 10/14/18 0001      AROM  Right Shoulder Flexion  135 Degrees    Right Shoulder ABduction  145 Degrees                   [facility] Adult PT Treatment/Exercise - 10/14/18 0001      Manual Therapy   Manual Therapy  Manual Lymphatic Drainage (MLD);Passive ROM    Manual Lymphatic Drainage (MLD)  in supine: short neck, 5 diaphragmatic breaths, right inguinal nodes and establishment of axillo inguinal pathway then to L s/l to focus on area of swelling in right lateral trunk just inferior to right axilla    Passive ROM  to right shoulder in direction of flexion, abduction and ER                  PT Long Term Goals - 10/12/18 1824      PT LONG TERM GOAL #1   Title  Pt will  increase right shouldre abduction to > 135 degrees so that she can easily receive radiation therapy     Baseline  110    Time  4    Period  Weeks    Status  New      PT LONG TERM GOAL #2   Title  Pt will be independent in a home program for range of motion and strength    Time  4    Period  Weeks    Status  New      PT LONG TERM GOAL #3   Title  Pt will  verbalize lymphedema risk reduction precautions     Time  4    Period  Weeks    Status  New            Plan - 10/14/18 1204    Clinical Impression Statement  Pt continues to demonstrate tightness in right shoulder with abduction and flexion. her abduction ROM has improved greatly since her last session. Educated pt about importance of continuing her exercises over the next two week while [facility] is closed due to coronavirus. Pt is having swelling in right lateral trunk at inferior axilla. Educated pt in self MLD today to help with swelling.     Stability/Clinical Decision Making  Evolving/Moderate complexity    Rehab Potential  Good    PT Frequency  2x / week    PT Duration  4 weeks    PT Treatment/Interventions  ADLs/Self Care Home Management;Therapeutic activities;Therapeutic exercise;Neuromuscular re-education;Patient/family education;Manual techniques;Passive range of motion    PT Next Visit Plan  make chip pack, assess swelling in inferior axilla, P/AA/AROM to right shoulder with manual techniques as needed, upgrad HEP as needed     PT Home Exercise Plan  supine dowel exercise, self MLD    Consulted and Agree with Plan of Care  Patient       Patient will benefit from skilled therapeutic intervention in order to improve the following deficits and impairments:  Decreased activity tolerance, Decreased knowledge of precautions, Decreased knowledge of use of DME, Decreased strength, Increased fascial restricitons, Pain, Postural dysfunction, Impaired UE functional use, Impaired perceived functional ability, Decreased  range of motion  Visit Diagnosis: Aftercare following surgery for neoplasm  Stiffness of right shoulder joint  Acute pain of right shoulder     Problem List Patient Active Problem List   Diagnosis Date Noted  . Breast cancer, right ([place]) 09/14/2018  . Port-A-Cath in place 05/11/2018  . Genetic testing 04/12/2018  . Family history of prostate cancer   . Family history  of colon cancer   . Family history of melanoma   . Malignant neoplasm of upper-inner quadrant of right breast in female, estrogen receptor positive ([place]) 03/10/2018    Allyson Sabal [facility] 10/14/2018, 12:08 PM  [place], [place], [address] Phone: [phone]   Fax:  [phone]  Name: Claire Guzman MRN: [id number] Date of Birth: Jan 27, 1966  Manus Gunning, PT 10/14/18 12:08 PM

## 2018-10-15 ENCOUNTER — Inpatient Hospital Stay: Payer: BLUE CROSS/BLUE SHIELD

## 2018-10-15 ENCOUNTER — Inpatient Hospital Stay: Payer: BLUE CROSS/BLUE SHIELD | Attending: Hematology

## 2018-10-15 ENCOUNTER — Other Ambulatory Visit: Payer: Self-pay

## 2018-10-15 VITALS — BP 133/87 | HR 73 | Temp 98.2°F | Resp 18

## 2018-10-15 DIAGNOSIS — Z5112 Encounter for antineoplastic immunotherapy: Secondary | ICD-10-CM | POA: Insufficient documentation

## 2018-10-15 DIAGNOSIS — C50211 Malignant neoplasm of upper-inner quadrant of right female breast: Secondary | ICD-10-CM

## 2018-10-15 DIAGNOSIS — Z5111 Encounter for antineoplastic chemotherapy: Secondary | ICD-10-CM | POA: Insufficient documentation

## 2018-10-15 DIAGNOSIS — Z95828 Presence of other vascular implants and grafts: Secondary | ICD-10-CM

## 2018-10-15 DIAGNOSIS — Z170 Estrogen receptor positive status [ER+]: Secondary | ICD-10-CM

## 2018-10-15 LAB — CMP (CANCER CENTER ONLY)
ALK PHOS: 107 U/L (ref 38–126)
ALT: 13 U/L (ref 0–44)
AST: 18 U/L (ref 15–41)
Albumin: 4 g/dL (ref 3.5–5.0)
Anion gap: 14 (ref 5–15)
BILIRUBIN TOTAL: 0.4 mg/dL (ref 0.3–1.2)
BUN: 14 mg/dL (ref 6–20)
CO2: 22 mmol/L (ref 22–32)
Calcium: 9.3 mg/dL (ref 8.9–10.3)
Chloride: 106 mmol/L (ref 98–111)
Creatinine: 0.75 mg/dL (ref 0.44–1.00)
GFR, Est AFR Am: >60 mL/min (ref >60)
GFR, Estimated: >60 mL/min (ref >60)
Glucose, Bld: 136 mg/dL — ABNORMAL HIGH (ref 70–99)
Potassium: 3.2 mmol/L — ABNORMAL LOW (ref 3.5–5.1)
Sodium: 142 mmol/L (ref 135–145)
Total Protein: 7.5 g/dL (ref 6.5–8.1)

## 2018-10-15 LAB — CBC WITH DIFFERENTIAL (CANCER CENTER ONLY)
Abs Immature Granulocytes: 0.01 10*3/uL (ref 0.00–0.07)
Basophils Absolute: 0 10*3/uL (ref 0.0–0.1)
Basophils Relative: 1 %
EOS ABS: 0.4 10*3/uL (ref 0.0–0.5)
EOS PCT: 7 %
HCT: 34 % — ABNORMAL LOW (ref 36.0–46.0)
Hemoglobin: 12 g/dL (ref 12.0–15.0)
Immature Granulocytes: 0 %
Lymphocytes Relative: 40 %
Lymphs Abs: 2 10*3/uL (ref 0.7–4.0)
MCH: 33.6 pg (ref 26.0–34.0)
MCHC: 35.3 g/dL (ref 30.0–36.0)
MCV: 95.2 fL (ref 80.0–100.0)
MONO ABS: 0.3 10*3/uL (ref 0.1–1.0)
Monocytes Relative: 6 %
Neutro Abs: 2.3 10*3/uL (ref 1.7–7.7)
Neutrophils Relative %: 46 %
Platelet Count: 164 10*3/uL (ref 150–400)
RBC: 3.57 MIL/uL — ABNORMAL LOW (ref 3.87–5.11)
RDW: 11.9 % (ref 11.5–15.5)
WBC Count: 5 10*3/uL (ref 4.0–10.5)
nRBC: 0 % (ref 0.0–0.2)

## 2018-10-15 MED ORDER — ACETAMINOPHEN 325 MG PO TABS
650.0000 mg | ORAL_TABLET | Freq: Once | ORAL | Status: AC
  Administered 2018-10-15: 650 mg via ORAL

## 2018-10-15 MED ORDER — SODIUM CHLORIDE 0.9% FLUSH
10.0000 mL | INTRAVENOUS | Status: DC | PRN
  Administered 2018-10-15: 10 mL
  Filled 2018-10-15: qty 10

## 2018-10-15 MED ORDER — SODIUM CHLORIDE 0.9 % IV SOLN
420.0000 mg | Freq: Once | INTRAVENOUS | Status: AC
  Administered 2018-10-15: 420 mg via INTRAVENOUS
  Filled 2018-10-15: qty 14

## 2018-10-15 MED ORDER — ACETAMINOPHEN 325 MG PO TABS
ORAL_TABLET | ORAL | Status: AC
  Filled 2018-10-15: qty 2

## 2018-10-15 MED ORDER — DIPHENHYDRAMINE HCL 25 MG PO CAPS
50.0000 mg | ORAL_CAPSULE | Freq: Once | ORAL | Status: AC
  Administered 2018-10-15: 50 mg via ORAL

## 2018-10-15 MED ORDER — SODIUM CHLORIDE 0.9 % IV SOLN
Freq: Once | INTRAVENOUS | Status: AC
  Administered 2018-10-15: 09:00:00 via INTRAVENOUS
  Filled 2018-10-15: qty 250

## 2018-10-15 MED ORDER — DIPHENHYDRAMINE HCL 25 MG PO CAPS
ORAL_CAPSULE | ORAL | Status: AC
  Filled 2018-10-15: qty 2

## 2018-10-15 MED ORDER — HEPARIN SOD (PORK) LOCK FLUSH 100 UNIT/ML IV SOLN
500.0000 [IU] | Freq: Once | INTRAVENOUS | Status: AC | PRN
  Administered 2018-10-15: 500 [IU]
  Filled 2018-10-15: qty 5

## 2018-10-15 MED ORDER — TRASTUZUMAB CHEMO 150 MG IV SOLR
450.0000 mg | Freq: Once | INTRAVENOUS | Status: AC
  Administered 2018-10-15: 450 mg via INTRAVENOUS
  Filled 2018-10-15: qty 21.43

## 2018-10-15 NOTE — Patient Instructions (Signed)
Coronavirus (COVID-19) Are you at risk?  Are you at risk for the Coronavirus (COVID-19)?  To be considered HIGH RISK for Coronavirus (COVID-19), you have to meet the following criteria:  . Traveled to [place], [place], [place], [place] or [place]; or in the [place] to [place], [place], [place], or [place]; and have fever, cough, and shortness of breath within the last 2 weeks of travel OR . Been in close contact with a person diagnosed with COVID-19 within the last 2 weeks and have fever, cough, and shortness of breath . IF YOU DO NOT MEET THESE CRITERIA, YOU ARE CONSIDERED LOW RISK FOR COVID-19.  What to do if you are HIGH RISK for COVID-19?  . If you are having a medical emergency, call 911. . Seek medical care right away. Before you go to a doctor's office, urgent care or emergency department, call ahead and tell them about your recent travel, contact with someone diagnosed with COVID-19, and your symptoms. You should receive instructions from your physician's office regarding next steps of care.  . When you arrive at healthcare provider, tell the healthcare staff immediately you have returned from visiting [place], [place], [place], [place] or [place]; or traveled in the [place] to [place], [place], [place], or [place]; in the last two weeks or you have been in close contact with a person diagnosed with COVID-19 in the last 2 weeks.   . Tell the health care staff about your symptoms: fever, cough and shortness of breath. . After you have been seen by a medical provider, you will be either: o Tested for (COVID-19) and discharged home on quarantine except to seek medical care if symptoms worsen, and asked to  - Stay home and avoid contact with others until you get your results (4-5 days)  - Avoid travel on public transportation if possible (such as bus, train, or airplane) or o Sent to the Emergency Department by EMS for evaluation, COVID-19 testing, and possible  admission depending on your condition and test results.  What to do if you are LOW RISK for COVID-19?  Reduce your risk of any infection by using the same precautions used for avoiding the common cold or flu:  . Wash your hands often with soap and warm water for at least 20 seconds.  If soap and water are not readily available, use an alcohol-based hand sanitizer with at least 60% alcohol.  . If coughing or sneezing, cover your mouth and nose by coughing or sneezing into the elbow areas of your shirt or coat, into a tissue or into your sleeve (not your hands). . Avoid shaking hands with others and consider head nods or verbal greetings only. . Avoid touching your eyes, nose, or mouth with unwashed hands.  . Avoid close contact with people who are sick. . Avoid places or events with large numbers of people in one location, like concerts or sporting events. . Carefully consider travel plans you have or are making. . If you are planning any travel outside or inside the [place], visit the CDC's Travelers' Health webpage for the latest health notices. . If you have some symptoms but not all symptoms, continue to monitor at home and seek medical attention if your symptoms worsen. . If you are having a medical emergency, call 911.   ADDITIONAL HEALTHCARE OPTIONS FOR PATIENTS  [facility] / e-Visit: [email or web address]         [facility]: [phone]  [facility]: [phone]                   [facility]: [phone]   

## 2018-10-16 LAB — CANCER ANTIGEN 27.29: CA 27.29: 25.9 U/mL (ref 0.0–38.6)

## 2018-10-18 ENCOUNTER — Other Ambulatory Visit: Payer: Self-pay

## 2018-10-18 ENCOUNTER — Encounter: Payer: BLUE CROSS/BLUE SHIELD | Admitting: Physical Therapy

## 2018-10-18 ENCOUNTER — Telehealth: Payer: Self-pay

## 2018-10-18 DIAGNOSIS — Z170 Estrogen receptor positive status [ER+]: Secondary | ICD-10-CM

## 2018-10-18 DIAGNOSIS — C50211 Malignant neoplasm of upper-inner quadrant of right female breast: Secondary | ICD-10-CM

## 2018-10-18 MED ORDER — POTASSIUM CHLORIDE CRYS ER 20 MEQ PO TBCR: EXTENDED_RELEASE_TABLET | ORAL | 0 refills | Status: DC

## 2018-10-18 NOTE — Telephone Encounter (Signed)
Spoke with patient regarding recent lab results, per Dr. Burr Medico potassium low, instructed to increased to one tablet twice daily for a week.  Patient verbalized an understanding and is requesting a script be sent into her pharmacy.  This has been done.

## 2018-10-18 NOTE — Telephone Encounter (Signed)
-----   Message from Truitt Merle, MD sent at 10/15/2018  3:58 PM EDT ----- Please call pt and let her know her K was slightly low this morning, make sure she is take KCL, and increase by 1tab/day for a week, thanks   Truitt Merle  10/15/2018

## 2018-10-22 ENCOUNTER — Other Ambulatory Visit: Payer: Self-pay

## 2018-10-22 ENCOUNTER — Encounter: Payer: Self-pay | Admitting: Physical Therapy

## 2018-10-22 ENCOUNTER — Ambulatory Visit: Payer: BLUE CROSS/BLUE SHIELD | Admitting: Physical Therapy

## 2018-10-22 DIAGNOSIS — M25511 Pain in right shoulder: Secondary | ICD-10-CM

## 2018-10-22 DIAGNOSIS — M25611 Stiffness of right shoulder, not elsewhere classified: Secondary | ICD-10-CM

## 2018-10-22 DIAGNOSIS — R293 Abnormal posture: Secondary | ICD-10-CM | POA: Diagnosis not present

## 2018-10-22 DIAGNOSIS — Z483 Aftercare following surgery for neoplasm: Secondary | ICD-10-CM

## 2018-10-22 NOTE — Progress Notes (Signed)
Location of Breast Cancer: Right Breast  Histology per Pathology Report:  03/09/19 Diagnosis 1. Breast, right, needle core biopsy, 2:30 - INVASIVE DUCTAL CARCINOMA, GRADE 3. - LYMPHOVASCULAR SPACE INVOLVEMENT BY TUMOR. 2. Lymph node, needle/core biopsy, right axilla - METASTATIC CARCINOMA INVOLVING SCANT LYMPHOID TISSUE.  Receptor Status: ER(90%), PR (5%), Her2-neu (POS), Ki-(60%)  09/14/18 Diagnosis 1. Lymph node, sentinel, biopsy, right axillary with radioactive seed - ONE OF ONE LYMPH NODES NEGATIVE FOR CARCINOMA (0/1). - MILD TREATMENT EFFECT. - BIOPSY SITE. 2. Lymph node, sentinel, biopsy, right axillary - ONE OF ONE LYMPH NODES NEGATIVE FOR CARCINOMA (0/1). 3. Lymph node, sentinel, biopsy, right - ONE OF ONE LYMPH NODES NEGATIVE FOR CARCINOMA (0/1). 4. Lymph node, sentinel, biopsy, right - ONE OF ONE LYMPH NODES NEGATIVE FOR CARCINOMA (0/1). 5. Lymph node, sentinel, biopsy, right - ONE OF ONE LYMPH NODES NEGATIVE FOR CARCINOMA (0/1). 6. Lymph node, sentinel, biopsy, right - ONE OF ONE LYMPH NODES NEGATIVE FOR CARCINOMA (0/1). 7. Lymph node, sentinel, biopsy, right - ONE OF ONE LYMPH NODES NEGATIVE FOR CARCINOMA (0/1). 8. Lymph node, sentinel, biopsy, right - ONE OF ONE LYMPH NODES NEGATIVE FOR CARCINOMA (0/1). 9. Lymph node, sentinel, biopsy, right - ONE OF ONE LYMPH NODES NEGATIVE FOR CARCINOMA (0/1). 10. Lymph node, sentinel, biopsy, right - ONE OF ONE LYMPH NODES NEGATIVE FOR CARCINOMA (0/1). 11. Lymph node, sentinel, biopsy, right - ONE OF ONE LYMPH NODES NEGATIVE FOR CARCINOMA (0/1). 12. Lymph node, sentinel, biopsy, right - ONE OF ONE LYMPH NODES NEGATIVE FOR CARCINOMA (0/1). 13. Nipple Biopsy, right - BENIGN NIPPLE TISSUE. 14. Breast, simple mastectomy, right - NO RESIDUAL CARCINOMA IDENTIFIED. - TREATMENT EFFECT. - FIBROCYSTIC CHANGE. - ONE OF ONE LYMPH NODES NEGATIVE FOR CARCINOMA (0/1). - SEE ONCOLOGY TABLE  Did patient present with symptoms or was  this found on screening mammography?: Her GYN MD palpated it during her yearly exam.   Past/Anticipated interventions by surgeon, if any: 09/14/18 PROCEDURE:  1. Right breast reconstruction with tissue expander 2. Acellular dermis (Alloderm) for breast reconstruction 300 cm2 SURGEON: Irene Limbo MD [facility]  09/14/18 Procedure:                 Inject blue dye right breast                                      Targeted right axillary deep lymph node biopsy with radioactive seed localization                                       Frozen section                                       Right axillary sentinel lymph node mapping and biopsy                                        Right nipple sparing mastectomy                                        Right nipple biopsy Surgeon:  Edsel Petrin. Dalbert Batman, M.D., [facility]   Past/Anticipated interventions by medical oncology, if any:  09/24/18 Dr. Burr Medico -She was diagnosed in 02/2018. Given the size of her mass and LN involvement, which predicts highrisk of recurrence, she received neoadjuvantchemoof 6 cyclesTCHP.  -She underwent right breast mastectomy and axillary lymph node dissection on 09/14/18. We discussed her pathology results which shows completes pathological response, no residual tumor, or lymph nodes negative.    -I discussed that complete pathological response predict good outcome, her risk of recurrence is small. -She will continue her Herceptin and Perjeta (due to node positive disease at diagnosis) to complete 1 year of treatment in 02/2019. She is tolerating well with mild diarrhea.  -Given positive lymph nodes prior to surgery she will still benefit from adjuvant postmastectomy radiation to reduce risk of local recurrence.  -She plans to proceed with breast reconstruction with Dr. Iran Planas after radiation.  -Given her ER/PR positive disease she will proceed with anti-estrogen therapy after radiation. Will discuss further at next  visit.  -Labs reviewed, CBC WNL except mild anemia with hg at 11, CMP WNL. Overall adequate to proceed with Herceptin/Perjeta today and every 3 weeks.  -f/u in 6 weeks   Lymphedema issues, if any:  She has some edema to her right lateral breast. She has fair arm mobility.   Pain issues, if any:  She reports soreness to her surgery site.   SAFETY ISSUES:  Prior radiation? No  Pacemaker/ICD? No  Possible current pregnancy? No, she is not having periods.   Is the patient on methotrexate? No  Current Complaints / other details:    BP (!) 128/59 (BP Location: Right Arm, Patient Position: Sitting)   Pulse 84   Temp 98.7 F (37.1 C) (Oral)   Resp 20   Ht _0  (1.626 m)   Wt 153 lb 3.2 oz (69.5 kg)   SpO2 100%   BMI 26.30 kg/m     Wt Readings from Last 3 Encounters:  10/27/18 153 lb 3.2 oz (69.5 kg)  09/24/18 156 lb 3.2 oz (70.9 kg)  09/14/18 151 lb 7.3 oz (68.7 kg)   Tenika Keeran, Stephani Police, RN 10/22/2018,1:43 PM

## 2018-10-22 NOTE — Therapy (Addendum)
[place], [place], [address] Phone: [phone]   Fax:  [phone]  Physical Therapy Treatment  Patient Details  Name: Claire Guzman MRN: [id number] Date of Birth: 1966-06-03 Referring Provider (PT): Dr. Iran Planas    Encounter Date: 10/22/2018  PT End of Session - 10/22/18 1122    Visit Number  3    Number of Visits  9    PT Start Time  9432   pt arrived late   PT Stop Time  1115    PT Time Calculation (min)  39 min    Activity Tolerance  Patient tolerated treatment well    Behavior During Therapy  [facility] for tasks assessed/performed       Past Medical History:  Diagnosis Date  . Abdominal pain, unspecified site   . Anxiety state, unspecified   . Cancer [facility])    R breast cancer  . Complication of anesthesia    convulsions - when she has appendectomy about 12 years ago at [facility]  . Contact dermatitis and other eczema, due to unspecified cause   . Essential hypertension, benign   . Family history of colon cancer   . Family history of melanoma   . Family history of prostate cancer   . GERD (gastroesophageal reflux disease)   . Hematuria, unspecified   . Hypertension   . Lower back pain   . Mixed hyperlipidemia   . Rosacea   . Unspecified symptom associated with female genital organs     Past Surgical History:  Procedure Laterality Date  . BREAST RECONSTRUCTION WITH PLACEMENT OF TISSUE EXPANDER AND ALLODERM Right 09/14/2018   Procedure: BREAST RECONSTRUCTION WITH PLACEMENT OF TISSUE EXPANDER AND ALLODERM;  Surgeon: Irene Limbo, MD;  Location: [facility];  Service: Plastics;  Laterality: Right;  . CESAREAN SECTION    . DIAGNOSTIC LAPAROSCOPY    . EYE SURGERY     lasix  . LAPAROSCOPIC APPENDECTOMY    . MASTECTOMY WITH RADIOACTIVE SEED GUIDED EXCISION AND AXILLARY SENTINEL LYMPH NODE BIOPSY Right 09/14/2018   Procedure: RIGHT NIPPLE SPARING MASTECTOMY WITH TARGETED DEEP RIGHT  AXILLARY LYMPH NODE BIOPSY WITH RADIOACTIVE SEED LOCALIZATION AND RIGHT AXILLARY SENTINEL LYMPH NODE BIOPSY, FROZEN SECTION AND POSSIBLE COMPLETION AXILLARY LYMPH NODE DISSECTION INJECT BLUE DYE RIGHT BREAST;  Surgeon: Fanny Skates, MD;  Location: [facility];  Service: General;  Laterality: Right;  . PORTACATH PLACEMENT Left 04/13/2018   Procedure: INSERTION PORT-A-CATH WITH [place];  Surgeon: Fanny Skates, MD;  Location: [facility];  Service: General;  Laterality: Left;  . UTERINE FIBROID SURGERY      There were no vitals filed for this visit.  Subjective Assessment - 10/22/18 1037    Subjective  I am starting to be able to lie on my side but I can not do it for a long time. I woke up last night with shooting pain halfway down my arm.     Pertinent History  Pt diagnosed with breast cancer 02/24/2018 ( HER2+, ER90%, PR5%, Ki6760%) she underwent neoadjuvant chemotherapy and had nipple sparing mastectomy with targeted axillary node biopsy with immediate expander tissue expander 09/24/2018  She will be having radiation that may be starting in mid April     Patient Stated Goals  To get her shoulder range of motion back     Currently in Pain?  No/denies    Pain Score  0-No pain  [facility] PT Treatment/Exercise - 10/22/18 0001      Shoulder Exercises: Pulleys   Flexion  2 minutes   pt returned therapist demo   ABduction  2 minutes   pt returned therapist demo     Shoulder Exercises: Therapy Ball   Flexion  10 reps   with v/c for stretch at top   ABduction  Right;10 reps   with v/c for stretch at top     Manual Therapy   Manual Therapy  Manual Lymphatic Drainage (MLD);Passive ROM;Edema management    Edema Management  created foam chip pack for pt to wear in sports bra at night over area of fullness in right lateral trunk    Passive ROM  to right shoulder in direction of flexion, abduction and ER                  PT Long Term  Goals - 10/12/18 1824      PT LONG TERM GOAL #1   Title  Pt will increase right shouldre abduction to > 135 degrees so that she can easily receive radiation therapy     Baseline  110    Time  4    Period  Weeks    Status  New      PT LONG TERM GOAL #2   Title  Pt will be independent in a home program for range of motion and strength    Time  4    Period  Weeks    Status  New      PT LONG TERM GOAL #3   Title  Pt will  verbalize lymphedema risk reduction precautions     Time  4    Period  Weeks    Status  New            Plan - 10/22/18 1123    Clinical Impression Statement  Continued today with stretching to R shoulder. Added new AAROM exercises today. Continued with PROM to right shoulder to help improve ROM since pt will undergo radiation simulation next week. Created chip pack for pt to wear in her bra at night but pt does report that she thinks her swelling is improving though she has not attempted self MLD yet.     Stability/Clinical Decision Making  Evolving/Moderate complexity    Rehab Potential  Good    PT Frequency  2x / week    PT Duration  4 weeks    PT Treatment/Interventions  ADLs/Self Care Home Management;Therapeutic activities;Therapeutic exercise;Neuromuscular re-education;Patient/family education;Manual techniques;Passive range of motion    PT Next Visit Plan  pulleys, ball, give supine scap, assess swelling in inferior axilla, P/AA/AROM to right shoulder with manual techniques as needed, upgrad HEP as needed     PT Home Exercise Plan  supine dowel exercise, self MLD    Consulted and Agree with Plan of Care  Patient       Patient will benefit from skilled therapeutic intervention in order to improve the following deficits and impairments:  Decreased activity tolerance, Decreased knowledge of precautions, Decreased knowledge of use of DME, Decreased strength, Increased fascial restricitons, Pain, Postural dysfunction, Impaired UE functional use, Impaired  perceived functional ability, Decreased range of motion  Visit Diagnosis: Stiffness of right shoulder joint  Acute pain of right shoulder  Abnormal posture  Aftercare following surgery for neoplasm     Problem List Patient Active Problem List   Diagnosis Date Noted  . Breast cancer, right ([place]) 09/14/2018  .  Port-A-Cath in place 05/11/2018  . Genetic testing 04/12/2018  . Family history of prostate cancer   . Family history of colon cancer   . Family history of melanoma   . Malignant neoplasm of upper-inner quadrant of right breast in female, estrogen receptor positive ([place]) 03/10/2018    Allyson Sabal [facility] 10/22/2018, 11:26 AM  [place], [place], [address] Phone: [phone]   Fax:  [phone]  Name: WILFRED SIVERSON MRN: [id number] Date of Birth: [birth year]  Manus Gunning, PT 10/22/18 11:26 AM  PHYSICAL THERAPY DISCHARGE SUMMARY  Visits from Start of Care: 3  Current functional level related to goals / functional outcomes: See above   Remaining deficits: See above   Education / Equipment: HEP, edema managment  Plan: Patient agrees to discharge.  Patient goals were not met. Patient is being discharged due to the patient's request.  ?????    Allyson Sabal [place], [place] 12/01/18 11:14 AM

## 2018-10-26 ENCOUNTER — Telehealth: Payer: Self-pay | Admitting: *Deleted

## 2018-10-26 NOTE — Telephone Encounter (Signed)
Called patient to inform of time change for her 10-27-18 appts. ,spoke with patient and she is aware of these changes and is good with them

## 2018-10-26 NOTE — Telephone Encounter (Signed)
Called patient to inform that [facility] appt. has been moved so that she doesn't have to wait around of RT planning, lvm for a return call

## 2018-10-27 ENCOUNTER — Encounter: Payer: Self-pay | Admitting: Radiation Oncology

## 2018-10-27 ENCOUNTER — Ambulatory Visit
Admission: RE | Admit: 2018-10-27 | Discharge: 2018-10-27 | Disposition: A | Discharge status: Home / Self Care | Payer: BLUE CROSS/BLUE SHIELD | Source: Ambulatory Visit | Attending: Radiation Oncology | Admitting: Radiation Oncology

## 2018-10-27 ENCOUNTER — Other Ambulatory Visit: Payer: Self-pay

## 2018-10-27 ENCOUNTER — Ambulatory Visit: Payer: BLUE CROSS/BLUE SHIELD

## 2018-10-27 ENCOUNTER — Other Ambulatory Visit: Payer: Self-pay | Admitting: Radiation Oncology

## 2018-10-27 DIAGNOSIS — C50211 Malignant neoplasm of upper-inner quadrant of right female breast: Secondary | ICD-10-CM

## 2018-10-27 DIAGNOSIS — Z510 Encounter for antineoplastic radiation therapy: Secondary | ICD-10-CM | POA: Insufficient documentation

## 2018-10-27 DIAGNOSIS — Z9221 Personal history of antineoplastic chemotherapy: Secondary | ICD-10-CM | POA: Insufficient documentation

## 2018-10-27 DIAGNOSIS — Z170 Estrogen receptor positive status [ER+]: Secondary | ICD-10-CM | POA: Diagnosis not present

## 2018-10-27 DIAGNOSIS — C773 Secondary and unspecified malignant neoplasm of axilla and upper limb lymph nodes: Secondary | ICD-10-CM | POA: Insufficient documentation

## 2018-10-27 DIAGNOSIS — Z9011 Acquired absence of right breast and nipple: Secondary | ICD-10-CM | POA: Insufficient documentation

## 2018-10-27 DIAGNOSIS — Z79899 Other long term (current) drug therapy: Secondary | ICD-10-CM | POA: Insufficient documentation

## 2018-10-27 DIAGNOSIS — Z923 Personal history of irradiation: Secondary | ICD-10-CM | POA: Diagnosis not present

## 2018-10-27 DIAGNOSIS — G629 Polyneuropathy, unspecified: Secondary | ICD-10-CM | POA: Insufficient documentation

## 2018-10-27 NOTE — Addendum Note (Signed)
Encounter addended by: Eppie Gibson, MD on: 10/27/2018 12:41 PM  Actions taken: Clinical Note Signed

## 2018-10-27 NOTE — Progress Notes (Signed)
Radiation Oncology         [phone] ________________________________  Name: Claire Guzman MRN: [id number]  Date: 10/27/2018  DOB: [birth year]  Follow-Up Visit Note  Outpatient  CC: Koirala, Dibas, MD  Irene Limbo, MD  Diagnosis:      ICD-10-CM   1. Malignant neoplasm of upper-inner quadrant of right breast in female, estrogen receptor positive ([place]) C50.211    Z17.0   Cancer Staging Malignant neoplasm of upper-inner quadrant of right breast in female, estrogen receptor positive ([place]) Staging form: Breast, AJCC 8th Edition - Clinical stage from 03/08/2018: Stage IB (cT2, cN1, cM0, G3, ER+, PR+, HER2+) - Signed by Truitt Merle, MD on 03/16/2018 - Pathologic stage from 09/14/2018: No Stage Recommended (ypT0, pN0, cM0, GX, ER: Not Assessed, PR: Not Assessed, HER2: Not Assessed) - Signed by Truitt Merle, MD on 09/23/2018   CHIEF COMPLAINT: Here to discuss management of right breast cancer  Narrative:  The patient returns today for follow-up.    She completed chemotherapy neoadjuvantly, followed by mastectomy and expander placement in mid Feb.  She had a complete pathologic response.   She continues to work and social distancing is difficult in her office. She depends on work due to financial stressors.  She would like to proceed with RT ASAP to expedite completion of breast reconstruction.  She reports some tightness and edema in right chest lateral to expander.  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   SAFETY ISSUES:  Prior radiation? No  Pacemaker/ICD? No  Possible current pregnancy? No, she is not having periods.   Is the patient on methotrexate? No   Oncology History   Cancer Staging Malignant neoplasm of upper-inner quadrant of right breast in female, estrogen receptor positive ([place]) Staging form: Breast, AJCC 8th Edition - Clinical stage from 03/08/2018: Stage IB (cT2, cN1, cM0, G3, ER+, PR+, HER2+) - Signed by Truitt Merle, MD on 03/16/2018       Malignant  neoplasm of upper-inner quadrant of right breast in female, estrogen receptor positive ([place])   02/24/2018 Mammogram    screening mammogram on 02/24/2018 that was benign    03/04/2018 Mammogram    diagnostic mammogram on 03/04/2018 due to a palpable mass on her right breast. Diagnostic mammogram, breast US revealed and biopsy revealed suspicious mass in the right breast at 2:30 at the palpable site of concern and one suspicious right axillary LN.    03/08/2018 Cancer Staging    Staging form: Breast, AJCC 8th Edition - Clinical stage from 03/08/2018: Stage IB (cT2, cN1, cM0, G3, ER+, PR+, HER2+) - Signed by Truitt Merle, MD on 03/16/2018    03/08/2018 Receptors her2    Her2 positive, ER 90% PR 5% and Ki67 60%    03/08/2018 Pathology Results    Diagnosis 1. Breast, right, needle core biopsy, 2:30 - INVASIVE DUCTAL CARCINOMA, GRADE 3. - LYMPHOVASCULAR SPACE INVOLVEMENT BY TUMOR. 2. Lymph node, needle/core biopsy, right axilla - METASTATIC CARCINOMA INVOLVING SCANT LYMPHOID TISSUE.    03/10/2018 Initial Diagnosis    Malignant neoplasm of upper-inner quadrant of right breast in female, estrogen receptor positive ([place])    03/20/2018 Imaging    MRI brain 03/20/18 IMPRESSION: No evidence of metastatic disease.  Normal appearance of brain. Slightly heterogeneous marrow pattern of the upper cervical spine, nonspecific but likely benign, especially in the setting of early stage disease.     03/24/2018 Echocardiogram    Baseline ECHO 03/24/18 Study Conclusions - Left ventricle: The cavity size was normal. Wall thickness was   increased  in a pattern of mild LVH. Systolic function was normal.   The estimated ejection fraction was in the range of 60% to 65%.   Wall motion was normal; there were no regional wall motion   abnormalities. Doppler parameters are consistent with abnormal   left ventricular relaxation (grade 1 diastolic dysfunction). - Right ventricle: The cavity size was mildly dilated. Wall    thickness was normal.    03/25/2018 Imaging    MRI Breast B/l 03/25/18 IMPRESSION: 1. The patient's primary malignancy in the posterior right breast measures 3.2 x 2 x 2.6 cm with apparent invasion of the underlying pectoralis muscle. Numerous surrounding abnormal enhancing masses, consistent with satellite lesions, extend from 11 o'clock in the right breast into the inferolateral right breast with a total span of suspected disease measuring 4.5 x 4.6 x 6.4 cm in AP, transverse, and craniocaudal dimension. The suspected extent of disease involves the superior and inferolateral quadrants. The primary malignancy also extends just across midline into the superior medial quadrant. 2. No MRI evidence of malignancy in the left breast. 3. Known metastatic noted in the right axilla.    03/25/2018 Imaging    Whole body bone scan 03/25/18 IMPRESSION: No scintigraphic evidence skeletal metastasis.    03/25/2018 Imaging    CT CAP W Contrast 03/25/18 IMPRESSION: 1. Mass within the deep aspect of the right breast along the pectoralis muscle compatible with primary breast malignancy. 2. There is suggestion of two additional possible masses within the lateral aspect of the right breast versus nodular appearing breast tissue. Recommend dedicated evaluation with bilateral breast MRI. 3. Mildly thickened right axillary lymph node compatible with metastatic adenopathy, recently biopsied. 4. Multiple bilateral pulmonary nodules are indeterminate, potentially sequelae of prior infectious/inflammatory process. Recommend attention on follow-up. Consider follow-up CT in 6 months. 5. Portal venous gas is demonstrated within the left hepatic lobe. Additionally, there is wall thickening of the cecum and ascending colon with small amount of gas within the pericolonic vasculature. Constellation of findings is indeterminate in etiology however may be secondary to colitis at this location with considerations  including infectious, inflammatory or ischemic etiologies. Correlate for recent procedure. If patient is not up-to-date for colonic screening, recommend further evaluation with colonoscopy to exclude the possibility of colonic mass within the cecum/ascending colon. 6. These results were called by telephone at the time of interpretation on 03/25/2018 at 9:31 am to Dr. Truitt Merle , who verbally acknowledged these results.    03/30/2018 -  Chemotherapy    Neoadjuvant TCHP every 3 weeks starting 03/30/18. Completed 6 cycles of TCHP on 07/23/18.  Continued with maintenance Herceptin/Perjeta q3weeks to complete 1 year.     04/09/2018 Genetic Testing    Negative genetic testing on the [facility] Common Hereditary Cancers Panel. The Common Hereditary Cancers Panel offered by [facility] includes sequencing and/or deletion duplication testing of the following 47 genes: APC, ATM, AXIN2, BARD1, BMPR1A, BRCA1, BRCA2, BRIP1, CDH1, CDKN2A (p14ARF), CDKN2A (p16INK4a), CKD4, CHEK2, CTNNA1, DICER1, EPCAM (Deletion/duplication testing only), GREM1 (promoter region deletion/duplication testing only), KIT, MEN1, MLH1, MSH2, MSH3, MSH6, MUTYH, NBN, NF1, NHTL1, PALB2, PDGFRA, PMS2, POLD1, POLE, PTEN, RAD50, RAD51C, RAD51D, SDHB, SDHC, SDHD, SMAD4, SMARCA4. STK11, TP53, TSC1, TSC2, and VHL.  The following genes were evaluated for sequence changes only: SDHA and HOXB13 c.251G>A variant only.   Genetic testing did detect a Variant of Unknown Significance (VUS) in the POLD1 gene called c.985C>T (p.Pro329Ser). At this time, it is unknown if this variant is associated with increased cancer risk  or if this is a normal finding, but most variants such as this get reclassified to being inconsequential. It should not be used to make medical management decisions.   The report date is 04/09/2018.    07/24/2018 Breast MRI    IMPRESSION: 1. Resolution of the enhancing mass (known cancer) and small satellite lesions previously seen in the right  breast following chemotherapy.  2. The metastatic lymph node in the right axilla has decreased in size.  3.  No MRI evidence of left breast malignancy.    09/14/2018 Surgery    RIGHT NIPPLE SPARING MASTECTOMY WITH TARGETED DEEP RIGHT AXILLARY LYMPH NODE BIOPSY WITH RADIOACTIVE SEED LOCALIZATION AND RIGHT AXILLARY SENTINEL LYMPH NODE BIOPSY by Dr. Dalbert Batman  09/14/18     09/14/2018 Pathology Results    Diagnosis 1. Lymph node, sentinel, biopsy, right axillary with radioactive seed - ONE OF ONE LYMPH NODES NEGATIVE FOR CARCINOMA (0/1). - MILD TREATMENT EFFECT. - BIOPSY SITE. 2. Lymph node, sentinel, biopsy, right axillary - ONE OF ONE LYMPH NODES NEGATIVE FOR CARCINOMA (0/1). 3. Lymph node, sentinel, biopsy, right - ONE OF ONE LYMPH NODES NEGATIVE FOR CARCINOMA (0/1). 4. Lymph node, sentinel, biopsy, right - ONE OF ONE LYMPH NODES NEGATIVE FOR CARCINOMA (0/1). 5. Lymph node, sentinel, biopsy, right - ONE OF ONE LYMPH NODES NEGATIVE FOR CARCINOMA (0/1). 6. Lymph node, sentinel, biopsy, right - ONE OF ONE LYMPH NODES NEGATIVE FOR CARCINOMA (0/1). 7. Lymph node, sentinel, biopsy, right - ONE OF ONE LYMPH NODES NEGATIVE FOR CARCINOMA (0/1). 8. Lymph node, sentinel, biopsy, right - ONE OF ONE LYMPH NODES NEGATIVE FOR CARCINOMA (0/1). 9. Lymph node, sentinel, biopsy, right - ONE OF ONE LYMPH NODES NEGATIVE FOR CARCINOMA (0/1). 10. Lymph node, sentinel, biopsy, right - ONE OF ONE LYMPH NODES NEGATIVE FOR CARCINOMA (0/1). 11. Lymph node, sentinel, biopsy, right - ONE OF ONE LYMPH NODES NEGATIVE FOR CARCINOMA (0/1). 12. Lymph node, sentinel, biopsy, right - ONE OF ONE LYMPH NODES NEGATIVE FOR CARCINOMA (0/1). 13. Nipple Biopsy, right - BENIGN NIPPLE TISSUE. 14. Breast, simple mastectomy, right - NO RESIDUAL CARCINOMA IDENTIFIED. - TREATMENT EFFECT. - FIBROCYSTIC CHANGE. - ONE OF ONE LYMPH NODES NEGATIVE FOR CARCINOMA (0/1). - SEE ONCOLOGY TABLE.    09/14/2018 Cancer Staging     Staging form: Breast, AJCC 8th Edition - Pathologic stage from 09/14/2018: No Stage Recommended (ypT0, pN0, cM0, GX, ER: Not Assessed, PR: Not Assessed, HER2: Not Assessed) - Signed by Truitt Merle, MD on 09/23/2018       ALLERGIES:  is allergic to metrogel [metronidazole]; other; paxil [paroxetine hcl]; prednisone; and zomig [zolmitriptan].  Meds: Current Outpatient Medications  Medication Sig Dispense Refill  . ALPRAZolam (XANAX) 0.5 MG tablet Take 1 tablet (0.5 mg total) by mouth at bedtime as needed for anxiety. 30 tablet 0  . atenolol (TENORMIN) 50 MG tablet Take 50 mg by mouth at bedtime.    . cyclobenzaprine (FLEXERIL) 5 MG tablet Take 1 tablet (5 mg total) by mouth 3 (three) times daily as needed for muscle spasms. 30 tablet 0  . HYDROcodone-acetaminophen (NORCO) 5-325 MG tablet Take 1-2 tablets by mouth every 4 (four) hours as needed for moderate pain or severe pain. 40 tablet 0  . potassium chloride SA (K-DUR,KLOR-CON) 20 MEQ tablet TAKE 1 TABLET(20 MEQ) BY MOUTH TWICE DAILY 30 tablet 0  . venlafaxine (EFFEXOR) 37.5 MG tablet Take 1 tablet (37.5 mg total) by mouth 2 (two) times daily. 30 tablet 3  . omeprazole (PRILOSEC) 20 MG capsule Take 1  capsule (20 mg total) by mouth 2 (two) times daily before a meal. (Patient not taking: Reported on 10/27/2018) 60 capsule 2   No current facility-administered medications for this encounter.     Physical Findings:  height is _0  (1.626 m) and weight is 153 lb 3.2 oz (69.5 kg). Her oral temperature is 98.7 F (37.1 C). Her blood pressure is 128/59 (abnormal) and her pulse is 84. Her respiration is 20 and oxygen saturation is 100%. .     General: Alert and oriented, in no acute distress Breast exam reveals nipples intact bilaterally.  Scars healing nicely at IM fold, axilla on right.  Lab Findings: Lab Results  Component Value Date   WBC 5.0 10/15/2018   HGB 12.0 10/15/2018   HCT 34.0 (L) 10/15/2018   MCV 95.2 10/15/2018   PLT 164 10/15/2018        Radiographic Findings: As above  Impression/Plan: We discussed adjuvant radiotherapy today.  I recommend radiotherapy to the right chest wall and regional nodes in order to reduce risk of locoregional recurrence by 2/3.  The risks, benefits and side effects of this treatment were discussed in detail.  She understands that radiotherapy is associated with skin irritation and fatigue in the acute setting. Late effects can include cosmetic changes, tissue injury, capsular contracture, arm edema, and rare injury to internal organs.   She is enthusiastic about proceeding with treatment. A consent form has been signed and placed in her chart.  A total of 5 medically necessary complex treatment devices will be fabricated and supervised by me: 4 fields with MLCs for custom blocks to protect heart, and lungs;  and, a Vac-lok. MORE COMPLEX DEVICES MAY BE MADE IN DOSIMETRY FOR FIELD IN FIELD BEAMS FOR DOSE HOMOGENEITY.  I have requested : 3D Simulation which is medically necessary to give adequate dose to at risk tissues while sparing lungs and heart.  I have requested a DVH of the following structures: lungs, heart, esophagus, cord.    The patient will receive 50.4 Gy in 28 fractions to the chest wall and right regional nodes with 4 fields.  This will not be followed by a boost.  We discussed measures to reduce the risk of infection during the COVID-19 pandemic.  We discussed measures to take at work to reduce risk. We discussed the pros and cons of delaying RT. She would like to proceed with RT now given the risk/benefits anticipated.   I spent over 15 minutes minutes face to face with the patient and more than 50% of that time was spent in counseling and/or coordination of care. _____________________________________   Eppie Gibson, MD

## 2018-10-27 NOTE — Patient Instructions (Signed)
Coronavirus (COVID-19) Are you at risk?  Are you at risk for the Coronavirus (COVID-19)?  To be considered HIGH RISK for Coronavirus (COVID-19), you have to meet the following criteria:  . Traveled to [place], [place], [place], [place] or [place]; or in the [place] to [place], [place], [place], or [place]; and have fever, cough, and shortness of breath within the last 2 weeks of travel OR . Been in close contact with a person diagnosed with COVID-19 within the last 2 weeks and have fever, cough, and shortness of breath . IF YOU DO NOT MEET THESE CRITERIA, YOU ARE CONSIDERED LOW RISK FOR COVID-19.  What to do if you are HIGH RISK for COVID-19?  . If you are having a medical emergency, call 911. . Seek medical care right away. Before you go to a doctor's office, urgent care or emergency department, call ahead and tell them about your recent travel, contact with someone diagnosed with COVID-19, and your symptoms. You should receive instructions from your physician's office regarding next steps of care.  . When you arrive at healthcare provider, tell the healthcare staff immediately you have returned from visiting [place], [place], [place], [place] or [place]; or traveled in the [place] to [place], [place], [place], or [place]; in the last two weeks or you have been in close contact with a person diagnosed with COVID-19 in the last 2 weeks.   . Tell the health care staff about your symptoms: fever, cough and shortness of breath. . After you have been seen by a medical provider, you will be either: o Tested for (COVID-19) and discharged home on quarantine except to seek medical care if symptoms worsen, and asked to  - Stay home and avoid contact with others until you get your results (4-5 days)  - Avoid travel on public transportation if possible (such as bus, train, or airplane) or o Sent to the Emergency Department by EMS for evaluation, COVID-19 testing, and possible  admission depending on your condition and test results.  What to do if you are LOW RISK for COVID-19?  Reduce your risk of any infection by using the same precautions used for avoiding the common cold or flu:  . Wash your hands often with soap and warm water for at least 20 seconds.  If soap and water are not readily available, use an alcohol-based hand sanitizer with at least 60% alcohol.  . If coughing or sneezing, cover your mouth and nose by coughing or sneezing into the elbow areas of your shirt or coat, into a tissue or into your sleeve (not your hands). . Avoid shaking hands with others and consider head nods or verbal greetings only. . Avoid touching your eyes, nose, or mouth with unwashed hands.  . Avoid close contact with people who are sick. . Avoid places or events with large numbers of people in one location, like concerts or sporting events. . Carefully consider travel plans you have or are making. . If you are planning any travel outside or inside the [place], visit the CDC's Travelers' Health webpage for the latest health notices. . If you have some symptoms but not all symptoms, continue to monitor at home and seek medical attention if your symptoms worsen. . If you are having a medical emergency, call 911.   ADDITIONAL HEALTHCARE OPTIONS FOR PATIENTS  [facility] / e-Visit: [email or web address]         [facility]: [phone]  [facility]: [phone]                   [facility]: [phone]   

## 2018-10-27 NOTE — Progress Notes (Signed)
  Radiation Oncology         (336) [phone] ________________________________  Name: Claire Guzman MRN: [id number]  Date: 10/27/2018  DOB: 1966/01/15  SIMULATION AND TREATMENT PLANNING NOTE    Outpatient  DIAGNOSIS:     ICD-10-CM   1. Malignant neoplasm of upper-inner quadrant of right breast in female, estrogen receptor positive ([place]) C50.211    Z17.0     NARRATIVE:  The patient was brought to the [facility].  Identity was confirmed.  All relevant records and images related to the planned course of therapy were reviewed.  The patient freely provided informed written consent to proceed with treatment after reviewing the details related to the planned course of therapy. The consent form was witnessed and verified by the simulation staff.    Then, the patient was set-up in a stable reproducible supine position for radiation therapy with her ipsilateral arm over her head, and her upper body secured in a custom-made Vac-lok device.  CT images were obtained.  Surface markings were placed.  The CT images were loaded into the planning software.    TREATMENT PLANNING NOTE: Treatment planning then occurred.  The radiation prescription was entered and confirmed.     A total of 5 medically necessary complex treatment devices were fabricated and supervised by me: 4 fields with MLCs for custom blocks to protect heart, and lungs;  and, a Vac-lok. MORE COMPLEX DEVICES MAY BE MADE IN DOSIMETRY FOR FIELD IN FIELD BEAMS FOR DOSE HOMOGENEITY.  I have requested : 3D Simulation which is medically necessary to give adequate dose to at risk tissues while sparing lungs and heart.  I have requested a DVH of the following structures: lungs, heart, esophagus, cord.    The patient will receive 50.4 Gy in 28 fractions to the right chest wall and regional nodes with 4 fields.  This will be followed by a boost.  Optical Surface Tracking Plan:  Since intensity modulated radiotherapy (IMRT) and 3D  conformal radiation treatment methods are predicated on accurate and precise positioning for treatment, intrafraction motion monitoring is medically necessary to ensure accurate and safe treatment delivery. The ability to quantify intrafraction motion without excessive ionizing radiation dose can only be performed with optical surface tracking. Accordingly, surface imaging offers the opportunity to obtain 3D measurements of patient position throughout IMRT and 3D treatments without excessive radiation exposure. I am ordering optical surface tracking for this patient's upcoming course of radiotherapy.  ________________________________   Reference:  Ursula Alert, J, et al. Surface imaging-based analysis of intrafraction motion for breast radiotherapy patients.Journal of [place], n. 6, nov. 2014. ISSN [id number].  Available at: <http://www.jacmp.org/index.php/jacmp/article/view/4957>.    -----------------------------------  Eppie Gibson, MD

## 2018-10-28 ENCOUNTER — Telehealth: Payer: Self-pay | Admitting: Physical Therapy

## 2018-10-28 NOTE — Telephone Encounter (Signed)
Saw patient twice in the clinic last week. Called pt to check on her. She reports she is doing well and is able to lift her arms. She only has the pain in her side every 2-3 days and her swelling has gone down. She no longer has an urgent need. She is not interested in e visits at this time. Will call pt to see if she wants to reschedule when we reopen.  Allyson Sabal [place], [place] 10/28/18 1:33 PM

## 2018-11-01 DIAGNOSIS — Z170 Estrogen receptor positive status [ER+]: Secondary | ICD-10-CM | POA: Diagnosis not present

## 2018-11-01 DIAGNOSIS — Z510 Encounter for antineoplastic radiation therapy: Secondary | ICD-10-CM | POA: Diagnosis not present

## 2018-11-01 DIAGNOSIS — C50211 Malignant neoplasm of upper-inner quadrant of right female breast: Secondary | ICD-10-CM | POA: Diagnosis not present

## 2018-11-02 ENCOUNTER — Ambulatory Visit: Payer: BLUE CROSS/BLUE SHIELD | Admitting: Physical Therapy

## 2018-11-03 ENCOUNTER — Other Ambulatory Visit: Payer: Self-pay | Admitting: Hematology

## 2018-11-03 ENCOUNTER — Ambulatory Visit
Admission: RE | Admit: 2018-11-03 | Discharge: 2018-11-03 | Disposition: A | Discharge status: Home / Self Care | Payer: BLUE CROSS/BLUE SHIELD | Source: Ambulatory Visit | Attending: Radiation Oncology | Admitting: Radiation Oncology

## 2018-11-03 ENCOUNTER — Other Ambulatory Visit: Payer: Self-pay

## 2018-11-03 ENCOUNTER — Ambulatory Visit: Payer: BLUE CROSS/BLUE SHIELD

## 2018-11-03 DIAGNOSIS — Z170 Estrogen receptor positive status [ER+]: Secondary | ICD-10-CM | POA: Diagnosis not present

## 2018-11-03 DIAGNOSIS — F419 Anxiety disorder, unspecified: Secondary | ICD-10-CM

## 2018-11-03 DIAGNOSIS — Z510 Encounter for antineoplastic radiation therapy: Secondary | ICD-10-CM | POA: Diagnosis not present

## 2018-11-03 DIAGNOSIS — C50211 Malignant neoplasm of upper-inner quadrant of right female breast: Secondary | ICD-10-CM | POA: Diagnosis not present

## 2018-11-03 MED ORDER — ALPRAZOLAM 0.5 MG PO TABS: 0.5000 mg | ORAL_TABLET | Freq: Every evening | ORAL | 0 refills | Status: DC | PRN

## 2018-11-04 ENCOUNTER — Ambulatory Visit
Admission: RE | Admit: 2018-11-04 | Discharge: 2018-11-04 | Disposition: A | Discharge status: Home / Self Care | Payer: BLUE CROSS/BLUE SHIELD | Source: Ambulatory Visit | Attending: Radiation Oncology | Admitting: Radiation Oncology

## 2018-11-04 ENCOUNTER — Other Ambulatory Visit: Payer: Self-pay

## 2018-11-04 DIAGNOSIS — Z170 Estrogen receptor positive status [ER+]: Secondary | ICD-10-CM | POA: Diagnosis not present

## 2018-11-04 DIAGNOSIS — C50211 Malignant neoplasm of upper-inner quadrant of right female breast: Secondary | ICD-10-CM | POA: Diagnosis not present

## 2018-11-04 DIAGNOSIS — Z510 Encounter for antineoplastic radiation therapy: Secondary | ICD-10-CM | POA: Diagnosis not present

## 2018-11-04 NOTE — Progress Notes (Signed)
[place]   Telephone:([phone] Fax:(336) [phone]   Clinic Follow up Note   Patient Care Team: Lujean Amel, MD as PCP - General (Family Medicine) Fanny Skates, MD as Consulting Physician (General Surgery) Truitt Merle, MD as Consulting Physician (Hematology) Eppie Gibson, MD as Attending Physician (Radiation Oncology)  Date of Service:  11/05/2018  CHIEF COMPLAINT: F/u for right breast cancer  SUMMARY OF ONCOLOGIC HISTORY: Oncology History   Cancer Staging Malignant neoplasm of upper-inner quadrant of right breast in female, estrogen receptor positive ([place]) Staging form: Breast, AJCC 8th Edition - Clinical stage from 03/08/2018: Stage IB (cT2, cN1, cM0, G3, ER+, PR+, HER2+) - Signed by Truitt Merle, MD on 03/16/2018       Malignant neoplasm of upper-inner quadrant of right breast in female, estrogen receptor positive ([place])   02/24/2018 Mammogram    screening mammogram on 02/24/2018 that was benign    03/04/2018 Mammogram    diagnostic mammogram on 03/04/2018 due to a palpable mass on her right breast. Diagnostic mammogram, breast US revealed and biopsy revealed suspicious mass in the right breast at 2:30 at the palpable site of concern and one suspicious right axillary LN.    03/08/2018 Cancer Staging    Staging form: Breast, AJCC 8th Edition - Clinical stage from 03/08/2018: Stage IB (cT2, cN1, cM0, G3, ER+, PR+, HER2+) - Signed by Truitt Merle, MD on 03/16/2018    03/08/2018 Receptors her2    Her2 positive, ER 90% PR 5% and Ki67 60%    03/08/2018 Pathology Results    Diagnosis 1. Breast, right, needle core biopsy, 2:30 - INVASIVE DUCTAL CARCINOMA, GRADE 3. - LYMPHOVASCULAR SPACE INVOLVEMENT BY TUMOR. 2. Lymph node, needle/core biopsy, right axilla - METASTATIC CARCINOMA INVOLVING SCANT LYMPHOID TISSUE.    03/10/2018 Initial Diagnosis    Malignant neoplasm of upper-inner quadrant of right breast in female, estrogen receptor positive ([place])    03/20/2018 Imaging      MRI brain 03/20/18 IMPRESSION: No evidence of metastatic disease.  Normal appearance of brain. Slightly heterogeneous marrow pattern of the upper cervical spine, nonspecific but likely benign, especially in the setting of early stage disease.     03/24/2018 Echocardiogram    Baseline ECHO 03/24/18 Study Conclusions - Left ventricle: The cavity size was normal. Wall thickness was   increased in a pattern of mild LVH. Systolic function was normal.   The estimated ejection fraction was in the range of 60% to 65%.   Wall motion was normal; there were no regional wall motion   abnormalities. Doppler parameters are consistent with abnormal   left ventricular relaxation (grade 1 diastolic dysfunction). - Right ventricle: The cavity size was mildly dilated. Wall   thickness was normal.    03/25/2018 Imaging    MRI Breast B/l 03/25/18 IMPRESSION: 1. The patient's primary malignancy in the posterior right breast measures 3.2 x 2 x 2.6 cm with apparent invasion of the underlying pectoralis muscle. Numerous surrounding abnormal enhancing masses, consistent with satellite lesions, extend from 11 o'clock in the right breast into the inferolateral right breast with a total span of suspected disease measuring 4.5 x 4.6 x 6.4 cm in AP, transverse, and craniocaudal dimension. The suspected extent of disease involves the superior and inferolateral quadrants. The primary malignancy also extends just across midline into the superior medial quadrant. 2. No MRI evidence of malignancy in the left breast. 3. Known metastatic noted in the right axilla.    03/25/2018 Imaging    Whole body bone scan  03/25/18 IMPRESSION: No scintigraphic evidence skeletal metastasis.    03/25/2018 Imaging    CT CAP W Contrast 03/25/18 IMPRESSION: 1. Mass within the deep aspect of the right breast along the pectoralis muscle compatible with primary breast malignancy. 2. There is suggestion of two additional possible masses  within the lateral aspect of the right breast versus nodular appearing breast tissue. Recommend dedicated evaluation with bilateral breast MRI. 3. Mildly thickened right axillary lymph node compatible with metastatic adenopathy, recently biopsied. 4. Multiple bilateral pulmonary nodules are indeterminate, potentially sequelae of prior infectious/inflammatory process. Recommend attention on follow-up. Consider follow-up CT in 6 months. 5. Portal venous gas is demonstrated within the left hepatic lobe. Additionally, there is wall thickening of the cecum and ascending colon with small amount of gas within the pericolonic vasculature. Constellation of findings is indeterminate in etiology however may be secondary to colitis at this location with considerations including infectious, inflammatory or ischemic etiologies. Correlate for recent procedure. If patient is not up-to-date for colonic screening, recommend further evaluation with colonoscopy to exclude the possibility of colonic mass within the cecum/ascending colon. 6. These results were called by telephone at the time of interpretation on 03/25/2018 at 9:31 am to Dr. Truitt Merle , who verbally acknowledged these results.    03/30/2018 -  Chemotherapy    Neoadjuvant TCHP every 3 weeks starting 03/30/18. Completed 6 cycles of TCHP on 07/23/18.  Continued with maintenance Herceptin/Perjeta q3weeks to complete 1 year.     04/09/2018 Genetic Testing    Negative genetic testing on the [facility] Common Hereditary Cancers Panel. The Common Hereditary Cancers Panel offered by [facility] includes sequencing and/or deletion duplication testing of the following 47 genes: APC, ATM, AXIN2, BARD1, BMPR1A, BRCA1, BRCA2, BRIP1, CDH1, CDKN2A (p14ARF), CDKN2A (p16INK4a), CKD4, CHEK2, CTNNA1, DICER1, EPCAM (Deletion/duplication testing only), GREM1 (promoter region deletion/duplication testing only), KIT, MEN1, MLH1, MSH2, MSH3, MSH6, MUTYH, NBN, NF1, NHTL1, PALB2,  PDGFRA, PMS2, POLD1, POLE, PTEN, RAD50, RAD51C, RAD51D, SDHB, SDHC, SDHD, SMAD4, SMARCA4. STK11, TP53, TSC1, TSC2, and VHL.  The following genes were evaluated for sequence changes only: SDHA and HOXB13 c.251G>A variant only.   Genetic testing did detect a Variant of Unknown Significance (VUS) in the POLD1 gene called c.985C>T (p.Pro329Ser). At this time, it is unknown if this variant is associated with increased cancer risk or if this is a normal finding, but most variants such as this get reclassified to being inconsequential. It should not be used to make medical management decisions.   The report date is 04/09/2018.    07/24/2018 Breast MRI    IMPRESSION: 1. Resolution of the enhancing mass (known cancer) and small satellite lesions previously seen in the right breast following chemotherapy.  2. The metastatic lymph node in the right axilla has decreased in size.  3.  No MRI evidence of left breast malignancy.    09/14/2018 Surgery    RIGHT NIPPLE SPARING MASTECTOMY WITH TARGETED DEEP RIGHT AXILLARY LYMPH NODE BIOPSY WITH RADIOACTIVE SEED LOCALIZATION AND RIGHT AXILLARY SENTINEL LYMPH NODE BIOPSY by Dr. Dalbert Batman  09/14/18     09/14/2018 Pathology Results    Diagnosis 1. Lymph node, sentinel, biopsy, right axillary with radioactive seed - ONE OF ONE LYMPH NODES NEGATIVE FOR CARCINOMA (0/1). - MILD TREATMENT EFFECT. - BIOPSY SITE. 2. Lymph node, sentinel, biopsy, right axillary - ONE OF ONE LYMPH NODES NEGATIVE FOR CARCINOMA (0/1). 3. Lymph node, sentinel, biopsy, right - ONE OF ONE LYMPH NODES NEGATIVE FOR CARCINOMA (0/1). 4. Lymph node, sentinel, biopsy, right -  ONE OF ONE LYMPH NODES NEGATIVE FOR CARCINOMA (0/1). 5. Lymph node, sentinel, biopsy, right - ONE OF ONE LYMPH NODES NEGATIVE FOR CARCINOMA (0/1). 6. Lymph node, sentinel, biopsy, right - ONE OF ONE LYMPH NODES NEGATIVE FOR CARCINOMA (0/1). 7. Lymph node, sentinel, biopsy, right - ONE OF ONE LYMPH NODES NEGATIVE FOR  CARCINOMA (0/1). 8. Lymph node, sentinel, biopsy, right - ONE OF ONE LYMPH NODES NEGATIVE FOR CARCINOMA (0/1). 9. Lymph node, sentinel, biopsy, right - ONE OF ONE LYMPH NODES NEGATIVE FOR CARCINOMA (0/1). 10. Lymph node, sentinel, biopsy, right - ONE OF ONE LYMPH NODES NEGATIVE FOR CARCINOMA (0/1). 11. Lymph node, sentinel, biopsy, right - ONE OF ONE LYMPH NODES NEGATIVE FOR CARCINOMA (0/1). 12. Lymph node, sentinel, biopsy, right - ONE OF ONE LYMPH NODES NEGATIVE FOR CARCINOMA (0/1). 13. Nipple Biopsy, right - BENIGN NIPPLE TISSUE. 14. Breast, simple mastectomy, right - NO RESIDUAL CARCINOMA IDENTIFIED. - TREATMENT EFFECT. - FIBROCYSTIC CHANGE. - ONE OF ONE LYMPH NODES NEGATIVE FOR CARCINOMA (0/1). - SEE ONCOLOGY TABLE.    09/14/2018 Cancer Staging    Staging form: Breast, AJCC 8th Edition - Pathologic stage from 09/14/2018: No Stage Recommended (ypT0, pN0, cM0, GX, ER: Not Assessed, PR: Not Assessed, HER2: Not Assessed) - Signed by Truitt Merle, MD on 09/23/2018    11/03/2018 -  Radiation Therapy    Adjuvant Radiation with Dr. Isidore Moos starting 11/03/18       CURRENT THERAPY:  -Maintenance Herceptin/Perjeta q3weeks to complete 1 year treatment in 02/2019 -Adjuvant Radiation started 11/03/18   INTERVAL HISTORY:  Claire Guzman is here for a follow up and treatment. She presents to the clinic today by herself. She notes she still has joint pain and aches from her chemotherapy. This stiffness starts in the morning and will dissipate when she walks. Her restlessness and joint pain in her knee and shoulders are worse at night. She takes a Xanax once at night to help her relax.  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 This is just numbness, no tingling. She notes still having a bowel movement almost every 2 hours. Her first bowel movement after infusion is diarrhea. This is a everyday occurrence. She takes imodium which slows this down.  She notes her concerns of  continuing to work given COVID-19. 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  She notes her last period was when she first started chemo. Before than she was on birth control pill.    REVIEW OF SYSTEMS:   Constitutional: Denies fevers, chills or abnormal weight loss Eyes: Denies blurriness of vision Ears, nose, mouth, throat, and face: Denies mucositis or sore throat Respiratory: Denies cough, dyspnea or wheezes Cardiovascular: Denies palpitation, chest discomfort or lower extremity swelling Gastrointestinal:  Denies nausea, heartburn (+) Diarrhea  Skin: Denies abnormal skin rashes MSK: (+) Joint pain and stiffness  Lymphatics: Denies new lymphadenopathy or easy bruising Neurological:Denies new weaknesses (+) Neuropathy with numbness  Behavioral/Psych: Mood is stable, no new changes  All other systems were reviewed with the patient and are negative.  MEDICAL HISTORY:  Past Medical History:  Diagnosis Date   Abdominal pain, unspecified site    Anxiety state, unspecified    Cancer ([place])    R breast cancer   Complication of anesthesia    convulsions - when she has appendectomy about 12 years ago at [facility]   Contact dermatitis and other  eczema, due to unspecified cause    Essential hypertension, benign    Family history of colon cancer    Family history of melanoma    Family history of prostate cancer    GERD (gastroesophageal reflux disease)    Hematuria, unspecified    Hypertension    Lower back pain    Mixed hyperlipidemia    Rosacea    Unspecified symptom associated with female genital organs     SURGICAL HISTORY: Past Surgical History:  Procedure Laterality Date   BREAST RECONSTRUCTION WITH PLACEMENT OF TISSUE EXPANDER AND ALLODERM Right 09/14/2018   Procedure: BREAST RECONSTRUCTION WITH PLACEMENT OF TISSUE EXPANDER AND ALLODERM;  Surgeon: Irene Limbo, MD;   Location: [facility];  Service: Plastics;  Laterality: Right;   CESAREAN SECTION     DIAGNOSTIC LAPAROSCOPY     EYE SURGERY     lasix   LAPAROSCOPIC APPENDECTOMY     MASTECTOMY WITH RADIOACTIVE SEED GUIDED EXCISION AND AXILLARY SENTINEL LYMPH NODE BIOPSY Right 09/14/2018   Procedure: RIGHT NIPPLE SPARING MASTECTOMY WITH TARGETED DEEP RIGHT AXILLARY LYMPH NODE BIOPSY WITH RADIOACTIVE SEED LOCALIZATION AND RIGHT AXILLARY SENTINEL LYMPH NODE BIOPSY, FROZEN SECTION AND POSSIBLE COMPLETION AXILLARY LYMPH NODE DISSECTION INJECT BLUE DYE RIGHT BREAST;  Surgeon: Fanny Skates, MD;  Location: [facility];  Service: General;  Laterality: Right;   PORTACATH PLACEMENT Left 04/13/2018   Procedure: INSERTION PORT-A-CATH WITH [place];  Surgeon: Fanny Skates, MD;  Location: [facility];  Service: General;  Laterality: Left;   UTERINE FIBROID SURGERY      I have reviewed the social history and family history with the patient and they are unchanged from previous note.  ALLERGIES:  is allergic to metrogel [metronidazole]; other; paxil [paroxetine hcl]; prednisone; and zomig [zolmitriptan].  MEDICATIONS:  Current Outpatient Medications  Medication Sig Dispense Refill   ALPRAZolam (XANAX) 0.5 MG tablet Take 1 tablet (0.5 mg total) by mouth at bedtime as needed for anxiety. 30 tablet 0   atenolol (TENORMIN) 50 MG tablet Take 50 mg by mouth at bedtime.     cyclobenzaprine (FLEXERIL) 5 MG tablet Take 1 tablet (5 mg total) by mouth 3 (three) times daily as needed for muscle spasms. 30 tablet 0   HYDROcodone-acetaminophen (NORCO) 5-325 MG tablet Take 1-2 tablets by mouth every 4 (four) hours as needed for moderate pain or severe pain. 40 tablet 0   omeprazole (PRILOSEC) 20 MG capsule Take 1 capsule (20 mg total) by mouth 2 (two) times daily before a meal. 60 capsule 2   potassium chloride SA (K-DUR,KLOR-CON) 20 MEQ tablet TAKE 1 TABLET(20 MEQ) BY MOUTH TWICE DAILY 30 tablet 0   venlafaxine (EFFEXOR)  37.5 MG tablet Take 1 tablet (37.5 mg total) by mouth 2 (two) times daily. 30 tablet 3   No current facility-administered medications for this visit.    Facility-Administered Medications Ordered in Other Visits  Medication Dose Route Frequency Provider Last Rate Last Dose   heparin lock flush 100 unit/mL  500 Units Intracatheter Once PRN Truitt Merle, MD       pertuzumab (PERJETA) 420 mg in sodium chloride 0.9 % 250 mL chemo infusion  420 mg Intravenous Once Truitt Merle, MD       sodium chloride flush (NS) 0.9 % injection 10 mL  10 mL Intracatheter PRN Truitt Merle, MD       trastuzumab (HERCEPTIN) 450 mg in sodium chloride 0.9 % 250 mL chemo infusion  450 mg Intravenous Once Truitt Merle, MD  PHYSICAL EXAMINATION: ECOG PERFORMANCE STATUS: 1 - Symptomatic but completely ambulatory  Vitals:   11/05/18 0846  BP: 134/68  Pulse: 72  Resp: 17  Temp: 98.7 F (37.1 C)  SpO2: 100%   Filed Weights   11/05/18 0846  Weight: 153 lb 9.6 oz (69.7 kg)    GENERAL:alert, no distress and comfortable SKIN: skin color, texture, turgor are normal, no rashes or significant lesions EYES: normal, Conjunctiva are pink and non-injected, sclera clear OROPHARYNX:no exudate, no erythema and lips, buccal mucosa, and tongue normal  NECK: supple, thyroid normal size, non-tender, without nodularity LYMPH:  no palpable lymphadenopathy in the cervical, axillary or inguinal LUNGS: clear to auscultation and percussion with normal breathing effort HEART: regular rate & rhythm and no murmurs and no lower extremity edema ABDOMEN:abdomen soft, non-tender and normal bowel sounds Musculoskeletal:no cyanosis of digits and no clubbing  NEURO: alert & oriented x 3 with fluent speech, no focal motor/sensory deficits  LABORATORY DATA:  I have reviewed the data as listed CBC Latest Ref Rng & Units 11/05/2018 10/15/2018 09/24/2018  WBC 4.0 - 10.5 K/uL 4.5 5.0 5.4  Hemoglobin 12.0 - 15.0 g/dL 11.7(L) 12.0 11.0(L)  Hematocrit  36.0 - 46.0 % 32.9(L) 34.0(L) 31.3(L)  Platelets 150 - 400 K/uL 162 164 201     CMP Latest Ref Rng & Units 11/05/2018 10/15/2018 09/24/2018  Glucose 70 - 99 mg/dL 102(H) 136(H) 112(H)  BUN 6 - 20 mg/dL _0 Creatinine 0.44 - 1.00 mg/dL 0.67 0.75 0.67  Sodium 135 - 145 mmol/L 141 142 142  Potassium 3.5 - 5.1 mmol/L 3.5 3.2(L) 3.5  Chloride 98 - 111 mmol/L 107 106 104  CO2 22 - 32 mmol/L _1 Calcium 8.9 - 10.3 mg/dL 9.1 9.3 9.8  Total Protein 6.5 - 8.1 g/dL 7.2 7.5 7.3  Total Bilirubin 0.3 - 1.2 mg/dL 0.4 0.4 0.5  Alkaline Phos 38 - 126 U/L 105 107 118  AST 15 - 41 U/L _2 ALT 0 - 44 U/L _3 RADIOGRAPHIC STUDIES: I have personally reviewed the radiological images as listed and agreed with the findings in the report. No results found.   ASSESSMENT & PLAN:  Claire MCKENNY is a 53 y.o. female with   1.Malignant neoplasm of upper-inner quadrant of right breast in female, invasive ductal carcinoma, cT2N1M0 stage IB, ER+/PR+/HER2+, G3, ypT0N0 -She was diagnosed in 02/2018. Given the size of her mass and LN involvement, which predicts highrisk of recurrence, she received neoadjuvantchemoof 6 cyclesTCHP.  -She underwent right breast mastectomy and axillary lymph node dissection on 09/14/18. We discussed her pathology results which shows completes pathological response, no residual tumor, or lymph nodes negative.    -I discussed that complete pathological response predict good outcome, her risk of recurrence is small. -She will continue her Herceptin and Perjeta (due to node positive disease at diagnosis) to complete 1 year of treatment in 02/2019. She is tolerating well with mild diarrhea.  -After treatment she continues to have diarrhea then frequent bowel movements, likely secondary to Perjeta. If she is unable to control this with imodium I may hold Perjeta and she continue with Herceptin alone.  -Given positive lymph nodes prior to surgery she started  postmastectomy radiation on 11/03/18. She is tolerating well.  -She plans to proceed with breast reconstruction with Dr. Iran Planas after radiation. -After she completes radiation, she will start her on anti-estrogen therapy. Her last period was the start  of chemo and has yet to return, she is still pre-menopausal. I briefly discussed her options of Tamoxifen, AI with Zoladex injection or BSO. She is 40 old now, likely close to menopause, will likely proceed with Tamoxifen first and test her hormonal level a year later.  -Will discuss the option more anti-her2 treatment with Niratinib after her Herceptin maintenance therapy.  -She is recovered well from chemo, she still has joint stiffness and pain along with residual numbness of fingers and toes. I recommend she start OTC B12 daily. Will monitor.  -I discussed her immune system is recovering from chemo but she is still at higher risk for viral infection. For her job she is not able to work from home. I advised her to use proper mask, wash and sanitize her hands and area regularly.  -Labs reviewed, CBC WNL except Hg at 11.7, CMP WNL. Ca 27.29 still pending. Overall adequate to proceed with treatment today. She will continue Pregnancy testing for Herceptin/Perjeta.  -f/u after radiation in 6 weeks   2. Geneticsshowed no pathogenic mutations.   3. Anxiety  -She has history of anxiety, currently on Xanax as needed -Stable  4. HTN -She will continue atenolol. -BP is well controlled, 134/68 today (11/05/18)   5. Bilateral small lung nodules -Her staging CT scan showed bilateral multiple small lung nodules, 3 to 4 mm, indeterminate, likely benign. She is a never smoker -Plan tomonitorrepeat CT chest without contrastin 3-6 months  6. Hand and leg edema  -Mild, and symmetric, likely related to steroids and chemo, unlikely DVT. -She tried Lasix 20 mg dailyto twice daily,did not help her much. -I encouraged her to use compression  stockingsduring the day -overall improved   7. Diarrhea  -secondary to Perjeta  -I advised her to take 2 imodium every morning and then continue as needed through out day up to 8 tabs.  -If not controlled, I may stop Perjeta    Plan: -Lab reviewed, will proceed with Herceptin/Perjetatoday, and continue every 3 weeks -Continue with Radiation  -Lab, f/u and treatment in 6 weeks, will likely start her on taxomifen on next visit    No problem-specific Assessment & Plan notes found for this encounter.   No orders of the defined types were placed in this encounter.  All questions were answered. The patient knows to call the clinic with any problems, questions or concerns. No barriers to learning was detected. I spent 20 minutes counseling the patient face to face. The total time spent in the appointment was 25 minutes and more than 50% was on counseling and review of test results     Truitt Merle, MD 11/05/2018   I, Joslyn Devon, am acting as scribe for Truitt Merle, MD.   I have reviewed the above documentation for accuracy and completeness, and I agree with the above.

## 2018-11-05 ENCOUNTER — Other Ambulatory Visit: Payer: Self-pay

## 2018-11-05 ENCOUNTER — Inpatient Hospital Stay: Payer: BLUE CROSS/BLUE SHIELD

## 2018-11-05 ENCOUNTER — Ambulatory Visit
Admission: RE | Admit: 2018-11-05 | Discharge: 2018-11-05 | Disposition: A | Discharge status: Home / Self Care | Payer: BLUE CROSS/BLUE SHIELD | Source: Ambulatory Visit | Attending: Radiation Oncology | Admitting: Radiation Oncology

## 2018-11-05 ENCOUNTER — Encounter: Payer: Self-pay | Admitting: Hematology

## 2018-11-05 ENCOUNTER — Telehealth: Payer: Self-pay | Admitting: Hematology

## 2018-11-05 ENCOUNTER — Inpatient Hospital Stay (HOSPITAL_BASED_OUTPATIENT_CLINIC_OR_DEPARTMENT_OTHER): Payer: BLUE CROSS/BLUE SHIELD | Admitting: Hematology

## 2018-11-05 ENCOUNTER — Other Ambulatory Visit: Payer: Self-pay | Admitting: Hematology

## 2018-11-05 VITALS — BP 134/68 | HR 72 | Temp 98.7°F | Resp 17 | Ht 64.0 in | Wt 153.6 lb

## 2018-11-05 VITALS — BP 139/81 | HR 75 | Temp 97.9°F | Resp 18

## 2018-11-05 DIAGNOSIS — C773 Secondary and unspecified malignant neoplasm of axilla and upper limb lymph nodes: Secondary | ICD-10-CM | POA: Insufficient documentation

## 2018-11-05 DIAGNOSIS — R918 Other nonspecific abnormal finding of lung field: Secondary | ICD-10-CM

## 2018-11-05 DIAGNOSIS — I10 Essential (primary) hypertension: Secondary | ICD-10-CM

## 2018-11-05 DIAGNOSIS — Z9013 Acquired absence of bilateral breasts and nipples: Secondary | ICD-10-CM

## 2018-11-05 DIAGNOSIS — Z5112 Encounter for antineoplastic immunotherapy: Secondary | ICD-10-CM | POA: Insufficient documentation

## 2018-11-05 DIAGNOSIS — Z79899 Other long term (current) drug therapy: Secondary | ICD-10-CM

## 2018-11-05 DIAGNOSIS — Z170 Estrogen receptor positive status [ER+]: Secondary | ICD-10-CM

## 2018-11-05 DIAGNOSIS — C50211 Malignant neoplasm of upper-inner quadrant of right female breast: Secondary | ICD-10-CM

## 2018-11-05 DIAGNOSIS — Z510 Encounter for antineoplastic radiation therapy: Secondary | ICD-10-CM | POA: Diagnosis not present

## 2018-11-05 DIAGNOSIS — Z95828 Presence of other vascular implants and grafts: Secondary | ICD-10-CM

## 2018-11-05 DIAGNOSIS — R600 Localized edema: Secondary | ICD-10-CM

## 2018-11-05 DIAGNOSIS — F419 Anxiety disorder, unspecified: Secondary | ICD-10-CM

## 2018-11-05 DIAGNOSIS — G629 Polyneuropathy, unspecified: Secondary | ICD-10-CM

## 2018-11-05 DIAGNOSIS — R197 Diarrhea, unspecified: Secondary | ICD-10-CM

## 2018-11-05 LAB — CBC WITH DIFFERENTIAL (CANCER CENTER ONLY)
Abs Immature Granulocytes: 0.02 10*3/uL (ref 0.00–0.07)
Basophils Absolute: 0 10*3/uL (ref 0.0–0.1)
Basophils Relative: 1 %
Eosinophils Absolute: 0.2 10*3/uL (ref 0.0–0.5)
Eosinophils Relative: 5 %
HCT: 32.9 % — ABNORMAL LOW (ref 36.0–46.0)
Hemoglobin: 11.7 g/dL — ABNORMAL LOW (ref 12.0–15.0)
Immature Granulocytes: 0 %
Lymphocytes Relative: 34 %
Lymphs Abs: 1.6 10*3/uL (ref 0.7–4.0)
MCH: 33 pg (ref 26.0–34.0)
MCHC: 35.6 g/dL (ref 30.0–36.0)
MCV: 92.7 fL (ref 80.0–100.0)
Monocytes Absolute: 0.3 10*3/uL (ref 0.1–1.0)
Monocytes Relative: 7 %
Neutro Abs: 2.4 10*3/uL (ref 1.7–7.7)
Neutrophils Relative %: 53 %
Platelet Count: 162 10*3/uL (ref 150–400)
RBC: 3.55 MIL/uL — ABNORMAL LOW (ref 3.87–5.11)
RDW: 11.5 % (ref 11.5–15.5)
WBC Count: 4.5 10*3/uL (ref 4.0–10.5)
nRBC: 0 % (ref 0.0–0.2)

## 2018-11-05 LAB — CMP (CANCER CENTER ONLY)
ALT: 13 U/L (ref 0–44)
AST: 17 U/L (ref 15–41)
Albumin: 3.9 g/dL (ref 3.5–5.0)
Alkaline Phosphatase: 105 U/L (ref 38–126)
Anion gap: 12 (ref 5–15)
BUN: 15 mg/dL (ref 6–20)
CO2: 22 mmol/L (ref 22–32)
Calcium: 9.1 mg/dL (ref 8.9–10.3)
Chloride: 107 mmol/L (ref 98–111)
Creatinine: 0.67 mg/dL (ref 0.44–1.00)
GFR, Est AFR Am: >60 mL/min (ref >60)
GFR, Estimated: >60 mL/min (ref >60)
Glucose, Bld: 102 mg/dL — ABNORMAL HIGH (ref 70–99)
Potassium: 3.5 mmol/L (ref 3.5–5.1)
Sodium: 141 mmol/L (ref 135–145)
Total Bilirubin: 0.4 mg/dL (ref 0.3–1.2)
Total Protein: 7.2 g/dL (ref 6.5–8.1)

## 2018-11-05 LAB — PREGNANCY, URINE: Preg Test, Ur: NEGATIVE

## 2018-11-05 MED ORDER — DIPHENHYDRAMINE HCL 25 MG PO CAPS
50.0000 mg | ORAL_CAPSULE | Freq: Once | ORAL | Status: AC
  Administered 2018-11-05: 50 mg via ORAL

## 2018-11-05 MED ORDER — HEPARIN SOD (PORK) LOCK FLUSH 100 UNIT/ML IV SOLN
500.0000 [IU] | Freq: Once | INTRAVENOUS | Status: AC | PRN
  Administered 2018-11-05: 500 [IU]
  Filled 2018-11-05: qty 5

## 2018-11-05 MED ORDER — TRASTUZUMAB CHEMO 150 MG IV SOLR
450.0000 mg | Freq: Once | INTRAVENOUS | Status: AC
  Administered 2018-11-05: 450 mg via INTRAVENOUS
  Filled 2018-11-05: qty 21.43

## 2018-11-05 MED ORDER — SODIUM CHLORIDE 0.9% FLUSH
10.0000 mL | INTRAVENOUS | Status: DC | PRN
  Administered 2018-11-05: 10 mL
  Filled 2018-11-05: qty 10

## 2018-11-05 MED ORDER — ACETAMINOPHEN 325 MG PO TABS
650.0000 mg | ORAL_TABLET | Freq: Once | ORAL | Status: AC
  Administered 2018-11-05: 650 mg via ORAL

## 2018-11-05 MED ORDER — ACETAMINOPHEN 325 MG PO TABS
ORAL_TABLET | ORAL | Status: AC
  Filled 2018-11-05: qty 2

## 2018-11-05 MED ORDER — SODIUM CHLORIDE 0.9 % IV SOLN
Freq: Once | INTRAVENOUS | Status: AC
  Administered 2018-11-05: 10:00:00 via INTRAVENOUS
  Filled 2018-11-05: qty 250

## 2018-11-05 MED ORDER — DIPHENHYDRAMINE HCL 25 MG PO CAPS
ORAL_CAPSULE | ORAL | Status: AC
  Filled 2018-11-05: qty 2

## 2018-11-05 MED ORDER — SODIUM CHLORIDE 0.9 % IV SOLN
420.0000 mg | Freq: Once | INTRAVENOUS | Status: AC
  Administered 2018-11-05: 420 mg via INTRAVENOUS
  Filled 2018-11-05: qty 14

## 2018-11-05 NOTE — Progress Notes (Signed)
Per Dr. Burr Medico, urine pregnancy result is needed prior to Herceptin/Perjeta release and administration.  Demetrius Charity, PharmD, [facility] Pharmacist Pharmacy Phone: [phone] 11/05/2018

## 2018-11-05 NOTE — Telephone Encounter (Signed)
Scheduled appt per 4/10 los. °

## 2018-11-05 NOTE — Progress Notes (Signed)
She did not stay for the full 30 minute post Keytruda.

## 2018-11-05 NOTE — Progress Notes (Signed)
11/05/18  Urine Pregnancy resulted 11/05/18 as negative.  Henreitta Leber, PharmD

## 2018-11-05 NOTE — Patient Instructions (Signed)
[facility] Discharge Instructions for Patients Receiving Chemotherapy  Today you received the following chemotherapy agents :  Herceptin and Perjeta.  To help prevent nausea and vomiting after your treatment, we encourage you to take your nausea medication as prescribed.   If you develop nausea and vomiting that is not controlled by your nausea medication, call the clinic.   BELOW ARE SYMPTOMS THAT SHOULD BE REPORTED IMMEDIATELY:  *FEVER GREATER THAN 100.5 F  *CHILLS WITH OR WITHOUT FEVER  NAUSEA AND VOMITING THAT IS NOT CONTROLLED WITH YOUR NAUSEA MEDICATION  *UNUSUAL SHORTNESS OF BREATH  *UNUSUAL BRUISING OR BLEEDING  TENDERNESS IN MOUTH AND THROAT WITH OR WITHOUT PRESENCE OF ULCERS  *URINARY PROBLEMS  *BOWEL PROBLEMS  UNUSUAL RASH Items with * indicate a potential emergency and should be followed up as soon as possible.  Feel free to call the clinic should you have any questions or concerns. The clinic phone number is [phone].  Please show the [facility] at check-in to the Emergency Department and triage nurse.  This information is directly available on the CDC website: RunningShows.co.za.html    Source:CDC Reference to specific commercial products, manufacturers, companies, or trademarks does not constitute its endorsement or recommendation by the [place], [place], or Centers for [employer] and [employer].

## 2018-11-06 LAB — CANCER ANTIGEN 27.29: CA 27.29: 27 U/mL (ref 0.0–38.6)

## 2018-11-08 ENCOUNTER — Other Ambulatory Visit: Payer: Self-pay

## 2018-11-08 ENCOUNTER — Encounter: Payer: BLUE CROSS/BLUE SHIELD | Admitting: Physical Therapy

## 2018-11-08 ENCOUNTER — Ambulatory Visit
Admission: RE | Admit: 2018-11-08 | Discharge: 2018-11-08 | Disposition: A | Discharge status: Home / Self Care | Payer: BLUE CROSS/BLUE SHIELD | Source: Ambulatory Visit | Attending: Radiation Oncology | Admitting: Radiation Oncology

## 2018-11-08 DIAGNOSIS — Z170 Estrogen receptor positive status [ER+]: Secondary | ICD-10-CM | POA: Diagnosis not present

## 2018-11-08 DIAGNOSIS — C50211 Malignant neoplasm of upper-inner quadrant of right female breast: Secondary | ICD-10-CM | POA: Diagnosis not present

## 2018-11-08 DIAGNOSIS — Z510 Encounter for antineoplastic radiation therapy: Secondary | ICD-10-CM | POA: Diagnosis not present

## 2018-11-08 MED ORDER — ALRA NON-METALLIC DEODORANT (RAD-ONC)
1.0000 "application " | Freq: Once | TOPICAL | Status: AC
  Administered 2018-11-08: 1 via TOPICAL

## 2018-11-08 MED ORDER — RADIAPLEXRX EX GEL
Freq: Once | CUTANEOUS | Status: AC
  Administered 2018-11-08: 16:00:00 via TOPICAL

## 2018-11-08 NOTE — Progress Notes (Signed)
Pt here for patient teaching.  Pt given Radiation and You booklet, skin care instructions, Alra deodorant and Radiaplex gel.  Reviewed areas of pertinence such as fatigue, skin changes, breast tenderness and breast swelling . Pt able to give teach back of to pat skin and use unscented/gentle soap,apply Radiaplex bid, avoid applying anything to skin within 4 hours of treatment, avoid wearing an under wire bra and to use an electric razor if they must shave. Pt demonstrated understanding of information given and will contact nursing with any questions or concerns.     Http://[email or web address]/treatmentinformation/whattoexpect/index      

## 2018-11-09 ENCOUNTER — Other Ambulatory Visit: Payer: Self-pay

## 2018-11-09 ENCOUNTER — Ambulatory Visit
Admission: RE | Admit: 2018-11-09 | Discharge: 2018-11-09 | Disposition: A | Discharge status: Home / Self Care | Payer: BLUE CROSS/BLUE SHIELD | Source: Ambulatory Visit | Attending: Radiation Oncology | Admitting: Radiation Oncology

## 2018-11-09 DIAGNOSIS — C50211 Malignant neoplasm of upper-inner quadrant of right female breast: Secondary | ICD-10-CM | POA: Diagnosis not present

## 2018-11-09 DIAGNOSIS — Z510 Encounter for antineoplastic radiation therapy: Secondary | ICD-10-CM | POA: Diagnosis not present

## 2018-11-09 DIAGNOSIS — Z170 Estrogen receptor positive status [ER+]: Secondary | ICD-10-CM | POA: Diagnosis not present

## 2018-11-10 ENCOUNTER — Ambulatory Visit
Admission: RE | Admit: 2018-11-10 | Discharge: 2018-11-10 | Disposition: A | Discharge status: Home / Self Care | Payer: BLUE CROSS/BLUE SHIELD | Source: Ambulatory Visit | Attending: Radiation Oncology | Admitting: Radiation Oncology

## 2018-11-10 ENCOUNTER — Other Ambulatory Visit: Payer: Self-pay

## 2018-11-10 DIAGNOSIS — Z510 Encounter for antineoplastic radiation therapy: Secondary | ICD-10-CM | POA: Diagnosis not present

## 2018-11-10 DIAGNOSIS — Z170 Estrogen receptor positive status [ER+]: Secondary | ICD-10-CM | POA: Diagnosis not present

## 2018-11-10 DIAGNOSIS — C50211 Malignant neoplasm of upper-inner quadrant of right female breast: Secondary | ICD-10-CM | POA: Diagnosis not present

## 2018-11-11 ENCOUNTER — Ambulatory Visit
Admission: RE | Admit: 2018-11-11 | Discharge: 2018-11-11 | Disposition: A | Discharge status: Home / Self Care | Payer: BLUE CROSS/BLUE SHIELD | Source: Ambulatory Visit | Attending: Radiation Oncology | Admitting: Radiation Oncology

## 2018-11-11 ENCOUNTER — Other Ambulatory Visit: Payer: Self-pay

## 2018-11-11 DIAGNOSIS — C50211 Malignant neoplasm of upper-inner quadrant of right female breast: Secondary | ICD-10-CM | POA: Diagnosis not present

## 2018-11-11 DIAGNOSIS — Z170 Estrogen receptor positive status [ER+]: Secondary | ICD-10-CM | POA: Diagnosis not present

## 2018-11-11 DIAGNOSIS — Z510 Encounter for antineoplastic radiation therapy: Secondary | ICD-10-CM | POA: Diagnosis not present

## 2018-11-12 ENCOUNTER — Other Ambulatory Visit: Payer: Self-pay

## 2018-11-12 ENCOUNTER — Ambulatory Visit
Admission: RE | Admit: 2018-11-12 | Discharge: 2018-11-12 | Disposition: A | Discharge status: Home / Self Care | Payer: BLUE CROSS/BLUE SHIELD | Source: Ambulatory Visit | Attending: Radiation Oncology | Admitting: Radiation Oncology

## 2018-11-12 DIAGNOSIS — Z170 Estrogen receptor positive status [ER+]: Secondary | ICD-10-CM | POA: Diagnosis not present

## 2018-11-12 DIAGNOSIS — Z510 Encounter for antineoplastic radiation therapy: Secondary | ICD-10-CM | POA: Diagnosis not present

## 2018-11-12 DIAGNOSIS — C50211 Malignant neoplasm of upper-inner quadrant of right female breast: Secondary | ICD-10-CM | POA: Diagnosis not present

## 2018-11-15 ENCOUNTER — Other Ambulatory Visit: Payer: Self-pay

## 2018-11-15 ENCOUNTER — Ambulatory Visit
Admission: RE | Admit: 2018-11-15 | Discharge: 2018-11-15 | Disposition: A | Discharge status: Home / Self Care | Payer: BLUE CROSS/BLUE SHIELD | Source: Ambulatory Visit | Attending: Radiation Oncology | Admitting: Radiation Oncology

## 2018-11-15 DIAGNOSIS — Z510 Encounter for antineoplastic radiation therapy: Secondary | ICD-10-CM | POA: Diagnosis not present

## 2018-11-15 DIAGNOSIS — Z170 Estrogen receptor positive status [ER+]: Secondary | ICD-10-CM | POA: Diagnosis not present

## 2018-11-15 DIAGNOSIS — C50211 Malignant neoplasm of upper-inner quadrant of right female breast: Secondary | ICD-10-CM | POA: Diagnosis not present

## 2018-11-16 ENCOUNTER — Other Ambulatory Visit: Payer: Self-pay

## 2018-11-16 ENCOUNTER — Ambulatory Visit
Admission: RE | Admit: 2018-11-16 | Discharge: 2018-11-16 | Disposition: A | Discharge status: Home / Self Care | Payer: BLUE CROSS/BLUE SHIELD | Source: Ambulatory Visit | Attending: Radiation Oncology | Admitting: Radiation Oncology

## 2018-11-16 DIAGNOSIS — Z510 Encounter for antineoplastic radiation therapy: Secondary | ICD-10-CM | POA: Diagnosis not present

## 2018-11-16 DIAGNOSIS — C50211 Malignant neoplasm of upper-inner quadrant of right female breast: Secondary | ICD-10-CM | POA: Diagnosis not present

## 2018-11-16 DIAGNOSIS — Z170 Estrogen receptor positive status [ER+]: Secondary | ICD-10-CM | POA: Diagnosis not present

## 2018-11-17 ENCOUNTER — Ambulatory Visit
Admission: RE | Admit: 2018-11-17 | Discharge: 2018-11-17 | Disposition: A | Discharge status: Home / Self Care | Payer: BLUE CROSS/BLUE SHIELD | Source: Ambulatory Visit | Attending: Radiation Oncology | Admitting: Radiation Oncology

## 2018-11-17 ENCOUNTER — Other Ambulatory Visit: Payer: Self-pay

## 2018-11-17 DIAGNOSIS — C50211 Malignant neoplasm of upper-inner quadrant of right female breast: Secondary | ICD-10-CM | POA: Diagnosis not present

## 2018-11-17 DIAGNOSIS — Z170 Estrogen receptor positive status [ER+]: Secondary | ICD-10-CM | POA: Diagnosis not present

## 2018-11-17 DIAGNOSIS — Z510 Encounter for antineoplastic radiation therapy: Secondary | ICD-10-CM | POA: Diagnosis not present

## 2018-11-18 ENCOUNTER — Other Ambulatory Visit: Payer: Self-pay

## 2018-11-18 ENCOUNTER — Ambulatory Visit
Admission: RE | Admit: 2018-11-18 | Discharge: 2018-11-18 | Disposition: A | Discharge status: Home / Self Care | Payer: BLUE CROSS/BLUE SHIELD | Source: Ambulatory Visit | Attending: Radiation Oncology | Admitting: Radiation Oncology

## 2018-11-18 DIAGNOSIS — Z170 Estrogen receptor positive status [ER+]: Secondary | ICD-10-CM | POA: Diagnosis not present

## 2018-11-18 DIAGNOSIS — Z510 Encounter for antineoplastic radiation therapy: Secondary | ICD-10-CM | POA: Diagnosis not present

## 2018-11-18 DIAGNOSIS — C50211 Malignant neoplasm of upper-inner quadrant of right female breast: Secondary | ICD-10-CM | POA: Diagnosis not present

## 2018-11-19 ENCOUNTER — Ambulatory Visit
Admission: RE | Admit: 2018-11-19 | Discharge: 2018-11-19 | Disposition: A | Discharge status: Home / Self Care | Payer: BLUE CROSS/BLUE SHIELD | Source: Ambulatory Visit | Attending: Radiation Oncology | Admitting: Radiation Oncology

## 2018-11-19 ENCOUNTER — Other Ambulatory Visit: Payer: Self-pay

## 2018-11-19 DIAGNOSIS — Z170 Estrogen receptor positive status [ER+]: Secondary | ICD-10-CM | POA: Diagnosis not present

## 2018-11-19 DIAGNOSIS — C50211 Malignant neoplasm of upper-inner quadrant of right female breast: Secondary | ICD-10-CM | POA: Diagnosis not present

## 2018-11-19 DIAGNOSIS — Z510 Encounter for antineoplastic radiation therapy: Secondary | ICD-10-CM | POA: Diagnosis not present

## 2018-11-22 ENCOUNTER — Ambulatory Visit
Admission: RE | Admit: 2018-11-22 | Discharge: 2018-11-22 | Disposition: A | Discharge status: Home / Self Care | Payer: BLUE CROSS/BLUE SHIELD | Source: Ambulatory Visit | Attending: Radiation Oncology | Admitting: Radiation Oncology

## 2018-11-22 ENCOUNTER — Other Ambulatory Visit: Payer: Self-pay

## 2018-11-22 DIAGNOSIS — Z510 Encounter for antineoplastic radiation therapy: Secondary | ICD-10-CM | POA: Diagnosis not present

## 2018-11-22 DIAGNOSIS — C50211 Malignant neoplasm of upper-inner quadrant of right female breast: Secondary | ICD-10-CM | POA: Diagnosis not present

## 2018-11-22 DIAGNOSIS — Z170 Estrogen receptor positive status [ER+]: Secondary | ICD-10-CM | POA: Diagnosis not present

## 2018-11-23 ENCOUNTER — Ambulatory Visit
Admission: RE | Admit: 2018-11-23 | Discharge: 2018-11-23 | Disposition: A | Discharge status: Home / Self Care | Payer: BLUE CROSS/BLUE SHIELD | Source: Ambulatory Visit | Attending: Radiation Oncology | Admitting: Radiation Oncology

## 2018-11-23 ENCOUNTER — Other Ambulatory Visit: Payer: Self-pay

## 2018-11-23 DIAGNOSIS — Z510 Encounter for antineoplastic radiation therapy: Secondary | ICD-10-CM | POA: Diagnosis not present

## 2018-11-23 DIAGNOSIS — C50211 Malignant neoplasm of upper-inner quadrant of right female breast: Secondary | ICD-10-CM | POA: Diagnosis not present

## 2018-11-23 DIAGNOSIS — Z170 Estrogen receptor positive status [ER+]: Secondary | ICD-10-CM | POA: Diagnosis not present

## 2018-11-24 ENCOUNTER — Ambulatory Visit
Admission: RE | Admit: 2018-11-24 | Discharge: 2018-11-24 | Disposition: A | Discharge status: Home / Self Care | Payer: BLUE CROSS/BLUE SHIELD | Source: Ambulatory Visit | Attending: Radiation Oncology | Admitting: Radiation Oncology

## 2018-11-24 ENCOUNTER — Other Ambulatory Visit: Payer: Self-pay

## 2018-11-24 DIAGNOSIS — Z170 Estrogen receptor positive status [ER+]: Secondary | ICD-10-CM | POA: Diagnosis not present

## 2018-11-24 DIAGNOSIS — C50211 Malignant neoplasm of upper-inner quadrant of right female breast: Secondary | ICD-10-CM | POA: Diagnosis not present

## 2018-11-24 DIAGNOSIS — Z510 Encounter for antineoplastic radiation therapy: Secondary | ICD-10-CM | POA: Diagnosis not present

## 2018-11-25 ENCOUNTER — Ambulatory Visit
Admission: RE | Admit: 2018-11-25 | Discharge: 2018-11-25 | Disposition: A | Discharge status: Home / Self Care | Payer: BLUE CROSS/BLUE SHIELD | Source: Ambulatory Visit | Attending: Radiation Oncology | Admitting: Radiation Oncology

## 2018-11-25 ENCOUNTER — Other Ambulatory Visit: Payer: Self-pay

## 2018-11-25 DIAGNOSIS — Z510 Encounter for antineoplastic radiation therapy: Secondary | ICD-10-CM | POA: Diagnosis not present

## 2018-11-25 DIAGNOSIS — Z170 Estrogen receptor positive status [ER+]: Secondary | ICD-10-CM | POA: Diagnosis not present

## 2018-11-25 DIAGNOSIS — C50211 Malignant neoplasm of upper-inner quadrant of right female breast: Secondary | ICD-10-CM | POA: Diagnosis not present

## 2018-11-26 ENCOUNTER — Telehealth: Payer: Self-pay

## 2018-11-26 ENCOUNTER — Inpatient Hospital Stay: Payer: BC Managed Care – PPO

## 2018-11-26 ENCOUNTER — Inpatient Hospital Stay: Payer: BC Managed Care – PPO | Attending: Hematology

## 2018-11-26 ENCOUNTER — Ambulatory Visit
Admission: RE | Admit: 2018-11-26 | Discharge: 2018-11-26 | Disposition: A | Discharge status: Home / Self Care | Payer: BC Managed Care – PPO | Source: Ambulatory Visit | Attending: Radiation Oncology | Admitting: Radiation Oncology

## 2018-11-26 ENCOUNTER — Other Ambulatory Visit: Payer: Self-pay | Admitting: Radiation Oncology

## 2018-11-26 ENCOUNTER — Encounter: Payer: Self-pay | Admitting: *Deleted

## 2018-11-26 ENCOUNTER — Other Ambulatory Visit: Payer: Self-pay | Admitting: Hematology

## 2018-11-26 ENCOUNTER — Other Ambulatory Visit: Payer: Self-pay

## 2018-11-26 VITALS — BP 119/82 | HR 68 | Temp 98.0°F | Resp 16

## 2018-11-26 DIAGNOSIS — Z510 Encounter for antineoplastic radiation therapy: Secondary | ICD-10-CM | POA: Insufficient documentation

## 2018-11-26 DIAGNOSIS — Z170 Estrogen receptor positive status [ER+]: Secondary | ICD-10-CM | POA: Insufficient documentation

## 2018-11-26 DIAGNOSIS — Z5111 Encounter for antineoplastic chemotherapy: Secondary | ICD-10-CM | POA: Diagnosis not present

## 2018-11-26 DIAGNOSIS — Z5112 Encounter for antineoplastic immunotherapy: Secondary | ICD-10-CM | POA: Diagnosis not present

## 2018-11-26 DIAGNOSIS — C50211 Malignant neoplasm of upper-inner quadrant of right female breast: Secondary | ICD-10-CM

## 2018-11-26 LAB — CMP (CANCER CENTER ONLY)
ALT: 12 U/L (ref 0–44)
AST: 18 U/L (ref 15–41)
Albumin: 4.1 g/dL (ref 3.5–5.0)
Alkaline Phosphatase: 126 U/L (ref 38–126)
Anion gap: 8 (ref 5–15)
BUN: 10 mg/dL (ref 6–20)
CO2: 28 mmol/L (ref 22–32)
Calcium: 9.3 mg/dL (ref 8.9–10.3)
Chloride: 106 mmol/L (ref 98–111)
Creatinine: 0.65 mg/dL (ref 0.44–1.00)
GFR, Est AFR Am: >60 mL/min (ref >60)
GFR, Estimated: >60 mL/min (ref >60)
Glucose, Bld: 103 mg/dL — ABNORMAL HIGH (ref 70–99)
Potassium: 3.6 mmol/L (ref 3.5–5.1)
Sodium: 142 mmol/L (ref 135–145)
Total Bilirubin: 0.4 mg/dL (ref 0.3–1.2)
Total Protein: 7.4 g/dL (ref 6.5–8.1)

## 2018-11-26 LAB — CBC WITH DIFFERENTIAL (CANCER CENTER ONLY)
Abs Immature Granulocytes: 0.01 10*3/uL (ref 0.00–0.07)
Basophils Absolute: 0 10*3/uL (ref 0.0–0.1)
Basophils Relative: 0 %
Eosinophils Absolute: 0.1 10*3/uL (ref 0.0–0.5)
Eosinophils Relative: 3 %
HCT: 33.1 % — ABNORMAL LOW (ref 36.0–46.0)
Hemoglobin: 12 g/dL (ref 12.0–15.0)
Immature Granulocytes: 0 %
Lymphocytes Relative: 17 %
Lymphs Abs: 0.7 10*3/uL (ref 0.7–4.0)
MCH: 32.6 pg (ref 26.0–34.0)
MCHC: 36.3 g/dL — ABNORMAL HIGH (ref 30.0–36.0)
MCV: 89.9 fL (ref 80.0–100.0)
Monocytes Absolute: 0.4 10*3/uL (ref 0.1–1.0)
Monocytes Relative: 9 %
Neutro Abs: 2.8 10*3/uL (ref 1.7–7.7)
Neutrophils Relative %: 71 %
Platelet Count: 151 10*3/uL (ref 150–400)
RBC: 3.68 MIL/uL — ABNORMAL LOW (ref 3.87–5.11)
RDW: 11.5 % (ref 11.5–15.5)
WBC Count: 4 10*3/uL (ref 4.0–10.5)
nRBC: 0 % (ref 0.0–0.2)

## 2018-11-26 MED ORDER — SODIUM CHLORIDE 0.9% FLUSH
10.0000 mL | INTRAVENOUS | Status: DC | PRN
  Administered 2018-11-26: 10 mL
  Filled 2018-11-26: qty 10

## 2018-11-26 MED ORDER — RADIAPLEXRX EX GEL
Freq: Once | CUTANEOUS | Status: AC
  Administered 2018-11-26: 13:00:00 via TOPICAL

## 2018-11-26 MED ORDER — DIPHENHYDRAMINE HCL 25 MG PO CAPS
ORAL_CAPSULE | ORAL | Status: AC
  Filled 2018-11-26: qty 2

## 2018-11-26 MED ORDER — ACETAMINOPHEN 325 MG PO TABS
650.0000 mg | ORAL_TABLET | Freq: Once | ORAL | Status: AC
  Administered 2018-11-26: 650 mg via ORAL

## 2018-11-26 MED ORDER — TRASTUZUMAB CHEMO 150 MG IV SOLR
450.0000 mg | Freq: Once | INTRAVENOUS | Status: AC
  Administered 2018-11-26: 450 mg via INTRAVENOUS
  Filled 2018-11-26: qty 21.43

## 2018-11-26 MED ORDER — SODIUM CHLORIDE 0.9 % IV SOLN
Freq: Once | INTRAVENOUS | Status: AC
  Administered 2018-11-26: 12:00:00 via INTRAVENOUS
  Filled 2018-11-26: qty 250

## 2018-11-26 MED ORDER — DIPHENHYDRAMINE HCL 25 MG PO CAPS
50.0000 mg | ORAL_CAPSULE | Freq: Once | ORAL | Status: AC
  Administered 2018-11-26: 50 mg via ORAL

## 2018-11-26 MED ORDER — TRAMADOL HCL 50 MG PO TABS: ORAL_TABLET | ORAL | 0 refills | Status: DC

## 2018-11-26 MED ORDER — ACETAMINOPHEN 325 MG PO TABS
ORAL_TABLET | ORAL | Status: AC
  Filled 2018-11-26: qty 2

## 2018-11-26 MED ORDER — SODIUM CHLORIDE 0.9 % IV SOLN
420.0000 mg | Freq: Once | INTRAVENOUS | Status: AC
  Administered 2018-11-26: 420 mg via INTRAVENOUS
  Filled 2018-11-26: qty 14

## 2018-11-26 MED ORDER — HEPARIN SOD (PORK) LOCK FLUSH 100 UNIT/ML IV SOLN
500.0000 [IU] | Freq: Once | INTRAVENOUS | Status: AC | PRN
  Administered 2018-11-26: 500 [IU]
  Filled 2018-11-26: qty 5

## 2018-11-26 NOTE — Patient Instructions (Signed)
[facility] Discharge Instructions for Patients Receiving Chemotherapy  Today you received the following chemotherapy agents Herceptin and Perjeta.   To help prevent nausea and vomiting after your treatment, we encourage you to take your nausea medication as directed.   If you develop nausea and vomiting that is not controlled by your nausea medication, call the clinic.   BELOW ARE SYMPTOMS THAT SHOULD BE REPORTED IMMEDIATELY:  *FEVER GREATER THAN 100.5 F  *CHILLS WITH OR WITHOUT FEVER  NAUSEA AND VOMITING THAT IS NOT CONTROLLED WITH YOUR NAUSEA MEDICATION  *UNUSUAL SHORTNESS OF BREATH  *UNUSUAL BRUISING OR BLEEDING  TENDERNESS IN MOUTH AND THROAT WITH OR WITHOUT PRESENCE OF ULCERS  *URINARY PROBLEMS  *BOWEL PROBLEMS  UNUSUAL RASH Items with * indicate a potential emergency and should be followed up as soon as possible.  Feel free to call the clinic should you have any questions or concerns. The clinic phone number is [phone].  Please show the CHEMO ALERT CARD at check-in to the Emergency Department and triage nurse.   

## 2018-11-26 NOTE — Telephone Encounter (Signed)
Okay to treat with last echo done in January, Dr. Burr Medico ordered one to be done within next couple of weeks.  Spoke with Rodney Langton RN in Infusion

## 2018-11-29 ENCOUNTER — Other Ambulatory Visit: Payer: Self-pay | Admitting: Radiation Oncology

## 2018-11-29 ENCOUNTER — Other Ambulatory Visit: Payer: Self-pay

## 2018-11-29 ENCOUNTER — Ambulatory Visit
Admission: RE | Admit: 2018-11-29 | Discharge: 2018-11-29 | Disposition: A | Discharge status: Home / Self Care | Payer: BC Managed Care – PPO | Source: Ambulatory Visit | Attending: Radiation Oncology | Admitting: Radiation Oncology

## 2018-11-29 ENCOUNTER — Telehealth: Payer: Self-pay | Admitting: Hematology

## 2018-11-29 DIAGNOSIS — C50211 Malignant neoplasm of upper-inner quadrant of right female breast: Secondary | ICD-10-CM

## 2018-11-29 DIAGNOSIS — Z5112 Encounter for antineoplastic immunotherapy: Secondary | ICD-10-CM | POA: Diagnosis not present

## 2018-11-29 DIAGNOSIS — Z170 Estrogen receptor positive status [ER+]: Secondary | ICD-10-CM | POA: Diagnosis not present

## 2018-11-29 DIAGNOSIS — Z5111 Encounter for antineoplastic chemotherapy: Secondary | ICD-10-CM | POA: Diagnosis not present

## 2018-11-29 DIAGNOSIS — Z510 Encounter for antineoplastic radiation therapy: Secondary | ICD-10-CM | POA: Diagnosis not present

## 2018-11-29 MED ORDER — GABAPENTIN 300 MG PO CAPS: 300.0000 mg | ORAL_CAPSULE | Freq: Three times a day (TID) | ORAL | 1 refills | Status: DC

## 2018-11-29 NOTE — Telephone Encounter (Signed)
Called and scheduled echo for this week per sch msg. Called and spoke with patient. Confirmed date and time

## 2018-11-30 ENCOUNTER — Ambulatory Visit
Admission: RE | Admit: 2018-11-30 | Discharge: 2018-11-30 | Disposition: A | Discharge status: Home / Self Care | Payer: BC Managed Care – PPO | Source: Ambulatory Visit | Attending: Radiation Oncology | Admitting: Radiation Oncology

## 2018-11-30 ENCOUNTER — Other Ambulatory Visit: Payer: Self-pay

## 2018-11-30 DIAGNOSIS — Z5111 Encounter for antineoplastic chemotherapy: Secondary | ICD-10-CM | POA: Diagnosis not present

## 2018-11-30 DIAGNOSIS — Z5112 Encounter for antineoplastic immunotherapy: Secondary | ICD-10-CM | POA: Diagnosis not present

## 2018-11-30 DIAGNOSIS — Z170 Estrogen receptor positive status [ER+]: Secondary | ICD-10-CM | POA: Diagnosis not present

## 2018-11-30 DIAGNOSIS — Z510 Encounter for antineoplastic radiation therapy: Secondary | ICD-10-CM | POA: Diagnosis not present

## 2018-11-30 DIAGNOSIS — C50211 Malignant neoplasm of upper-inner quadrant of right female breast: Secondary | ICD-10-CM | POA: Diagnosis not present

## 2018-12-01 ENCOUNTER — Encounter: Payer: Self-pay | Admitting: General Practice

## 2018-12-01 ENCOUNTER — Ambulatory Visit
Admission: RE | Admit: 2018-12-01 | Discharge: 2018-12-01 | Disposition: A | Discharge status: Home / Self Care | Payer: BC Managed Care – PPO | Source: Ambulatory Visit | Attending: Radiation Oncology | Admitting: Radiation Oncology

## 2018-12-01 ENCOUNTER — Other Ambulatory Visit: Payer: Self-pay

## 2018-12-01 DIAGNOSIS — C50211 Malignant neoplasm of upper-inner quadrant of right female breast: Secondary | ICD-10-CM | POA: Diagnosis not present

## 2018-12-01 DIAGNOSIS — Z170 Estrogen receptor positive status [ER+]: Secondary | ICD-10-CM | POA: Diagnosis not present

## 2018-12-01 DIAGNOSIS — Z5112 Encounter for antineoplastic immunotherapy: Secondary | ICD-10-CM | POA: Diagnosis not present

## 2018-12-01 DIAGNOSIS — Z5111 Encounter for antineoplastic chemotherapy: Secondary | ICD-10-CM | POA: Diagnosis not present

## 2018-12-01 DIAGNOSIS — Z510 Encounter for antineoplastic radiation therapy: Secondary | ICD-10-CM | POA: Diagnosis not present

## 2018-12-01 NOTE — Progress Notes (Signed)
[place] CSW Progress Notes  Referral received from MD to connect w patient for additional support. Called patient, multiple sources of psychosocial stress including financial stress, serious accident w husband, death in family, cancer diagnosis/treatment, death of family member.  Talked w patient about various options for support, encouraged attendance at virtual support group next week.  Sent information on financial aid options available, as well as referred to T[facility] Cancer for their help w dealing w financial stressors.  Will follow up on application to [employer] which patient submitted several months ago - she has not heard from them and wonders about its status.  Email inquiry sent to agency.  Claire Shell, LCSW Clinical Social Worker Phone:  [phone]

## 2018-12-02 ENCOUNTER — Other Ambulatory Visit: Payer: Self-pay

## 2018-12-02 ENCOUNTER — Ambulatory Visit
Admission: RE | Admit: 2018-12-02 | Discharge: 2018-12-02 | Disposition: A | Discharge status: Home / Self Care | Payer: BC Managed Care – PPO | Source: Ambulatory Visit | Attending: Radiation Oncology | Admitting: Radiation Oncology

## 2018-12-02 DIAGNOSIS — Z510 Encounter for antineoplastic radiation therapy: Secondary | ICD-10-CM | POA: Diagnosis not present

## 2018-12-02 DIAGNOSIS — Z5112 Encounter for antineoplastic immunotherapy: Secondary | ICD-10-CM | POA: Diagnosis not present

## 2018-12-02 DIAGNOSIS — C50211 Malignant neoplasm of upper-inner quadrant of right female breast: Secondary | ICD-10-CM | POA: Diagnosis not present

## 2018-12-02 DIAGNOSIS — Z170 Estrogen receptor positive status [ER+]: Secondary | ICD-10-CM | POA: Diagnosis not present

## 2018-12-02 DIAGNOSIS — Z5111 Encounter for antineoplastic chemotherapy: Secondary | ICD-10-CM | POA: Diagnosis not present

## 2018-12-03 ENCOUNTER — Other Ambulatory Visit: Payer: Self-pay

## 2018-12-03 ENCOUNTER — Ambulatory Visit (HOSPITAL_COMMUNITY)
Admission: RE | Admit: 2018-12-03 | Discharge: 2018-12-03 | Disposition: A | Discharge status: Home / Self Care | Payer: BLUE CROSS/BLUE SHIELD | Source: Ambulatory Visit | Attending: Hematology | Admitting: Hematology

## 2018-12-03 ENCOUNTER — Ambulatory Visit
Admission: RE | Admit: 2018-12-03 | Discharge: 2018-12-03 | Disposition: A | Discharge status: Home / Self Care | Payer: BC Managed Care – PPO | Source: Ambulatory Visit | Attending: Radiation Oncology | Admitting: Radiation Oncology

## 2018-12-03 ENCOUNTER — Other Ambulatory Visit (HOSPITAL_COMMUNITY): Payer: BLUE CROSS/BLUE SHIELD

## 2018-12-03 DIAGNOSIS — C50211 Malignant neoplasm of upper-inner quadrant of right female breast: Secondary | ICD-10-CM | POA: Diagnosis not present

## 2018-12-03 DIAGNOSIS — I10 Essential (primary) hypertension: Secondary | ICD-10-CM | POA: Diagnosis not present

## 2018-12-03 DIAGNOSIS — Z170 Estrogen receptor positive status [ER+]: Secondary | ICD-10-CM | POA: Insufficient documentation

## 2018-12-03 DIAGNOSIS — Z5111 Encounter for antineoplastic chemotherapy: Secondary | ICD-10-CM | POA: Diagnosis not present

## 2018-12-03 DIAGNOSIS — Z5112 Encounter for antineoplastic immunotherapy: Secondary | ICD-10-CM | POA: Diagnosis not present

## 2018-12-03 DIAGNOSIS — Z510 Encounter for antineoplastic radiation therapy: Secondary | ICD-10-CM | POA: Diagnosis not present

## 2018-12-03 NOTE — Progress Notes (Signed)
  Echocardiogram 2D Echocardiogram has been performed.  Darlina Sicilian M 12/03/2018, 2:11 PM

## 2018-12-06 ENCOUNTER — Ambulatory Visit
Admission: RE | Admit: 2018-12-06 | Discharge: 2018-12-06 | Disposition: A | Discharge status: Home / Self Care | Payer: BC Managed Care – PPO | Source: Ambulatory Visit | Attending: Radiation Oncology | Admitting: Radiation Oncology

## 2018-12-06 ENCOUNTER — Other Ambulatory Visit: Payer: Self-pay

## 2018-12-06 DIAGNOSIS — Z5111 Encounter for antineoplastic chemotherapy: Secondary | ICD-10-CM | POA: Diagnosis not present

## 2018-12-06 DIAGNOSIS — Z170 Estrogen receptor positive status [ER+]: Secondary | ICD-10-CM

## 2018-12-06 DIAGNOSIS — C50211 Malignant neoplasm of upper-inner quadrant of right female breast: Secondary | ICD-10-CM

## 2018-12-06 DIAGNOSIS — Z510 Encounter for antineoplastic radiation therapy: Secondary | ICD-10-CM | POA: Diagnosis not present

## 2018-12-06 DIAGNOSIS — Z5112 Encounter for antineoplastic immunotherapy: Secondary | ICD-10-CM | POA: Diagnosis not present

## 2018-12-06 MED ORDER — RADIAPLEXRX EX GEL
Freq: Once | CUTANEOUS | Status: AC
  Administered 2018-12-06: 16:00:00 via TOPICAL

## 2018-12-07 ENCOUNTER — Other Ambulatory Visit: Payer: Self-pay

## 2018-12-07 ENCOUNTER — Encounter: Payer: Self-pay | Admitting: General Practice

## 2018-12-07 ENCOUNTER — Ambulatory Visit
Admission: RE | Admit: 2018-12-07 | Discharge: 2018-12-07 | Disposition: A | Discharge status: Home / Self Care | Payer: BC Managed Care – PPO | Source: Ambulatory Visit | Attending: Radiation Oncology | Admitting: Radiation Oncology

## 2018-12-07 DIAGNOSIS — Z5111 Encounter for antineoplastic chemotherapy: Secondary | ICD-10-CM | POA: Diagnosis not present

## 2018-12-07 DIAGNOSIS — Z5112 Encounter for antineoplastic immunotherapy: Secondary | ICD-10-CM | POA: Diagnosis not present

## 2018-12-07 DIAGNOSIS — Z510 Encounter for antineoplastic radiation therapy: Secondary | ICD-10-CM | POA: Diagnosis not present

## 2018-12-07 DIAGNOSIS — Z170 Estrogen receptor positive status [ER+]: Secondary | ICD-10-CM | POA: Diagnosis not present

## 2018-12-07 DIAGNOSIS — C50211 Malignant neoplasm of upper-inner quadrant of right female breast: Secondary | ICD-10-CM | POA: Diagnosis not present

## 2018-12-07 NOTE — Progress Notes (Signed)
[place] CSW Progress Notes  Medical certification and pathology report submitted on patient behalf as application for [employer] submitted via email.  Patient will submit her portion of the application independently due to current COVID restrictions.  Edwyna Shell, LCSW Clinical Social Worker Phone:  [phone]

## 2018-12-08 ENCOUNTER — Other Ambulatory Visit: Payer: Self-pay | Admitting: Hematology

## 2018-12-08 ENCOUNTER — Other Ambulatory Visit: Payer: Self-pay

## 2018-12-08 ENCOUNTER — Telehealth: Payer: Self-pay

## 2018-12-08 ENCOUNTER — Other Ambulatory Visit: Payer: Self-pay | Admitting: Nurse Practitioner

## 2018-12-08 ENCOUNTER — Ambulatory Visit
Admission: RE | Admit: 2018-12-08 | Discharge: 2018-12-08 | Disposition: A | Discharge status: Home / Self Care | Payer: BC Managed Care – PPO | Source: Ambulatory Visit | Attending: Radiation Oncology | Admitting: Radiation Oncology

## 2018-12-08 DIAGNOSIS — Z5112 Encounter for antineoplastic immunotherapy: Secondary | ICD-10-CM | POA: Diagnosis not present

## 2018-12-08 DIAGNOSIS — Z170 Estrogen receptor positive status [ER+]: Secondary | ICD-10-CM | POA: Diagnosis not present

## 2018-12-08 DIAGNOSIS — Z510 Encounter for antineoplastic radiation therapy: Secondary | ICD-10-CM | POA: Diagnosis not present

## 2018-12-08 DIAGNOSIS — F419 Anxiety disorder, unspecified: Secondary | ICD-10-CM

## 2018-12-08 DIAGNOSIS — C50211 Malignant neoplasm of upper-inner quadrant of right female breast: Secondary | ICD-10-CM | POA: Diagnosis not present

## 2018-12-08 DIAGNOSIS — Z5111 Encounter for antineoplastic chemotherapy: Secondary | ICD-10-CM | POA: Diagnosis not present

## 2018-12-08 MED ORDER — ALPRAZOLAM 0.5 MG PO TABS: 0.5000 mg | ORAL_TABLET | Freq: Every evening | ORAL | 0 refills | Status: DC | PRN

## 2018-12-08 NOTE — Telephone Encounter (Signed)
Pt is requesting refill of her Alprazolam.  Thanks, Drucie Ip, RN

## 2018-12-08 NOTE — Telephone Encounter (Signed)
Patient calls with c/o of low grade temp 99.4, lethargic, denies any other symptoms. Consulted with Cira Rue NP informed patient to take Tylenol, monitor, if develops other symptoms to let us know.  Most likely treatment related.

## 2018-12-09 ENCOUNTER — Other Ambulatory Visit: Payer: Self-pay

## 2018-12-09 ENCOUNTER — Ambulatory Visit
Admission: RE | Admit: 2018-12-09 | Discharge: 2018-12-09 | Disposition: A | Discharge status: Home / Self Care | Payer: BC Managed Care – PPO | Source: Ambulatory Visit | Attending: Radiation Oncology | Admitting: Radiation Oncology

## 2018-12-09 DIAGNOSIS — Z5111 Encounter for antineoplastic chemotherapy: Secondary | ICD-10-CM | POA: Diagnosis not present

## 2018-12-09 DIAGNOSIS — Z5112 Encounter for antineoplastic immunotherapy: Secondary | ICD-10-CM | POA: Diagnosis not present

## 2018-12-09 DIAGNOSIS — Z170 Estrogen receptor positive status [ER+]: Secondary | ICD-10-CM | POA: Diagnosis not present

## 2018-12-09 DIAGNOSIS — C50211 Malignant neoplasm of upper-inner quadrant of right female breast: Secondary | ICD-10-CM | POA: Diagnosis not present

## 2018-12-09 DIAGNOSIS — Z510 Encounter for antineoplastic radiation therapy: Secondary | ICD-10-CM | POA: Diagnosis not present

## 2018-12-10 ENCOUNTER — Ambulatory Visit
Admission: RE | Admit: 2018-12-10 | Discharge: 2018-12-10 | Disposition: A | Discharge status: Home / Self Care | Payer: BC Managed Care – PPO | Source: Ambulatory Visit | Attending: Radiation Oncology | Admitting: Radiation Oncology

## 2018-12-10 ENCOUNTER — Encounter: Payer: Self-pay | Admitting: Radiation Oncology

## 2018-12-10 ENCOUNTER — Other Ambulatory Visit: Payer: Self-pay

## 2018-12-10 DIAGNOSIS — Z170 Estrogen receptor positive status [ER+]: Secondary | ICD-10-CM | POA: Diagnosis not present

## 2018-12-10 DIAGNOSIS — Z5111 Encounter for antineoplastic chemotherapy: Secondary | ICD-10-CM | POA: Diagnosis not present

## 2018-12-10 DIAGNOSIS — Z510 Encounter for antineoplastic radiation therapy: Secondary | ICD-10-CM | POA: Diagnosis not present

## 2018-12-10 DIAGNOSIS — Z5112 Encounter for antineoplastic immunotherapy: Secondary | ICD-10-CM | POA: Diagnosis not present

## 2018-12-10 DIAGNOSIS — C50211 Malignant neoplasm of upper-inner quadrant of right female breast: Secondary | ICD-10-CM | POA: Diagnosis not present

## 2018-12-16 ENCOUNTER — Telehealth: Payer: Self-pay

## 2018-12-16 NOTE — Telephone Encounter (Signed)
Spoke with patient regarding echo, Dr. Burr Medico consulted with Dr. Haroldine Laws echo is good, no concerns.  Patient verbalized an understanding.

## 2018-12-16 NOTE — Progress Notes (Signed)
[place]   [phone] [phone]   Clinic Follow up Note   Patient Care Team: Lujean Amel, MD as PCP - General (Family Medicine) Fanny Skates, MD as Consulting Physician (General Surgery) Truitt Merle, MD as Consulting Physician (Hematology) Eppie Gibson, MD as Attending Physician (Radiation Oncology) Mauro Kaufmann, RN as Oncology Nurse Navigator Rockwell Germany, RN as Oncology Nurse Navigator  Date of Service:  12/17/2018  CHIEF COMPLAINT: F/u for right breast cancer  SUMMARY OF ONCOLOGIC HISTORY: Oncology History   Cancer Staging Malignant neoplasm of upper-inner quadrant of right breast in female, estrogen receptor positive ([place]) Staging form: Breast, AJCC 8th Edition - Clinical stage from 03/08/2018: Stage IB (cT2, cN1, cM0, G3, ER+, PR+, HER2+) - Signed by Truitt Merle, MD on 03/16/2018       Malignant neoplasm of upper-inner quadrant of right breast in female, estrogen receptor positive ([place])   02/24/2018 Mammogram    screening mammogram on 02/24/2018 that was benign    03/04/2018 Mammogram    diagnostic mammogram on 03/04/2018 due to a palpable mass on her right breast. Diagnostic mammogram, breast US revealed and biopsy revealed suspicious mass in the right breast at 2:30 at the palpable site of concern and one suspicious right axillary LN.    03/08/2018 Cancer Staging    Staging form: Breast, AJCC 8th Edition - Clinical stage from 03/08/2018: Stage IB (cT2, cN1, cM0, G3, ER+, PR+, HER2+) - Signed by Truitt Merle, MD on 03/16/2018    03/08/2018 Receptors her2    Her2 positive, ER 90% PR 5% and Ki67 60%    03/08/2018 Pathology Results    Diagnosis 1. Breast, right, needle core biopsy, 2:30 - INVASIVE DUCTAL CARCINOMA, GRADE 3. - LYMPHOVASCULAR SPACE INVOLVEMENT BY TUMOR. 2. Lymph node, needle/core biopsy, right axilla - METASTATIC CARCINOMA INVOLVING SCANT LYMPHOID TISSUE.    03/10/2018 Initial Diagnosis    Malignant neoplasm of  upper-inner quadrant of right breast in female, estrogen receptor positive ([place])    03/20/2018 Imaging    MRI brain 03/20/18 IMPRESSION: No evidence of metastatic disease.  Normal appearance of brain. Slightly heterogeneous marrow pattern of the upper cervical spine, nonspecific but likely benign, especially in the setting of early stage disease.     03/24/2018 Echocardiogram    Baseline ECHO 03/24/18 Study Conclusions - Left ventricle: The cavity size was normal. Wall thickness was   increased in a pattern of mild LVH. Systolic function was normal.   The estimated ejection fraction was in the range of 60% to 65%.   Wall motion was normal; there were no regional wall motion   abnormalities. Doppler parameters are consistent with abnormal   left ventricular relaxation (grade 1 diastolic dysfunction). - Right ventricle: The cavity size was mildly dilated. Wall   thickness was normal.    03/25/2018 Imaging    MRI Breast B/l 03/25/18 IMPRESSION: 1. The patient's primary malignancy in the posterior right breast measures 3.2 x 2 x 2.6 cm with apparent invasion of the underlying pectoralis muscle. Numerous surrounding abnormal enhancing masses, consistent with satellite lesions, extend from 11 o'clock in the right breast into the inferolateral right breast with a total span of suspected disease measuring 4.5 x 4.6 x 6.4 cm in AP, transverse, and craniocaudal dimension. The suspected extent of disease involves the superior and inferolateral quadrants. The primary malignancy also extends just across midline into the superior medial quadrant. 2. No MRI evidence of malignancy in the left breast. 3. Known metastatic noted in  the right axilla.    03/25/2018 Imaging    Whole body bone scan 03/25/18 IMPRESSION: No scintigraphic evidence skeletal metastasis.    03/25/2018 Imaging    CT CAP W Contrast 03/25/18 IMPRESSION: 1. Mass within the deep aspect of the right breast along the pectoralis  muscle compatible with primary breast malignancy. 2. There is suggestion of two additional possible masses within the lateral aspect of the right breast versus nodular appearing breast tissue. Recommend dedicated evaluation with bilateral breast MRI. 3. Mildly thickened right axillary lymph node compatible with metastatic adenopathy, recently biopsied. 4. Multiple bilateral pulmonary nodules are indeterminate, potentially sequelae of prior infectious/inflammatory process. Recommend attention on follow-up. Consider follow-up CT in 6 months. 5. Portal venous gas is demonstrated within the left hepatic lobe. Additionally, there is wall thickening of the cecum and ascending colon with small amount of gas within the pericolonic vasculature. Constellation of findings is indeterminate in etiology however may be secondary to colitis at this location with considerations including infectious, inflammatory or ischemic etiologies. Correlate for recent procedure. If patient is not up-to-date for colonic screening, recommend further evaluation with colonoscopy to exclude the possibility of colonic mass within the cecum/ascending colon. 6. These results were called by telephone at the time of interpretation on 03/25/2018 at 9:31 am to Dr. Truitt Merle , who verbally acknowledged these results.    03/30/2018 -  Chemotherapy    Neoadjuvant TCHP every 3 weeks starting 03/30/18. Completed 6 cycles of TCHP on 07/23/18.  Continued with maintenance Herceptin/Perjeta q3weeks to complete 1 year.     04/09/2018 Genetic Testing    Negative genetic testing on the [facility] Common Hereditary Cancers Panel. The Common Hereditary Cancers Panel offered by [facility] includes sequencing and/or deletion duplication testing of the following 47 genes: APC, ATM, AXIN2, BARD1, BMPR1A, BRCA1, BRCA2, BRIP1, CDH1, CDKN2A (p14ARF), CDKN2A (p16INK4a), CKD4, CHEK2, CTNNA1, DICER1, EPCAM (Deletion/duplication testing only), GREM1 (promoter region  deletion/duplication testing only), KIT, MEN1, MLH1, MSH2, MSH3, MSH6, MUTYH, NBN, NF1, NHTL1, PALB2, PDGFRA, PMS2, POLD1, POLE, PTEN, RAD50, RAD51C, RAD51D, SDHB, SDHC, SDHD, SMAD4, SMARCA4. STK11, TP53, TSC1, TSC2, and VHL.  The following genes were evaluated for sequence changes only: SDHA and HOXB13 c.251G>A variant only.   Genetic testing did detect a Variant of Unknown Significance (VUS) in the POLD1 gene called c.985C>T (p.Pro329Ser). At this time, it is unknown if this variant is associated with increased cancer risk or if this is a normal finding, but most variants such as this get reclassified to being inconsequential. It should not be used to make medical management decisions.   The report date is 04/09/2018.    07/24/2018 Breast MRI    IMPRESSION: 1. Resolution of the enhancing mass (known cancer) and small satellite lesions previously seen in the right breast following chemotherapy.  2. The metastatic lymph node in the right axilla has decreased in size.  3.  No MRI evidence of left breast malignancy.    09/14/2018 Surgery    RIGHT NIPPLE SPARING MASTECTOMY WITH TARGETED DEEP RIGHT AXILLARY LYMPH NODE BIOPSY WITH RADIOACTIVE SEED LOCALIZATION AND RIGHT AXILLARY SENTINEL LYMPH NODE BIOPSY by Dr. Dalbert Batman  09/14/18     09/14/2018 Pathology Results    Diagnosis 1. Lymph node, sentinel, biopsy, right axillary with radioactive seed - ONE OF ONE LYMPH NODES NEGATIVE FOR CARCINOMA (0/1). - MILD TREATMENT EFFECT. - BIOPSY SITE. 2. Lymph node, sentinel, biopsy, right axillary - ONE OF ONE LYMPH NODES NEGATIVE FOR CARCINOMA (0/1). 3. Lymph node, sentinel, biopsy, right - ONE  OF ONE LYMPH NODES NEGATIVE FOR CARCINOMA (0/1). 4. Lymph node, sentinel, biopsy, right - ONE OF ONE LYMPH NODES NEGATIVE FOR CARCINOMA (0/1). 5. Lymph node, sentinel, biopsy, right - ONE OF ONE LYMPH NODES NEGATIVE FOR CARCINOMA (0/1). 6. Lymph node, sentinel, biopsy, right - ONE OF ONE LYMPH NODES NEGATIVE  FOR CARCINOMA (0/1). 7. Lymph node, sentinel, biopsy, right - ONE OF ONE LYMPH NODES NEGATIVE FOR CARCINOMA (0/1). 8. Lymph node, sentinel, biopsy, right - ONE OF ONE LYMPH NODES NEGATIVE FOR CARCINOMA (0/1). 9. Lymph node, sentinel, biopsy, right - ONE OF ONE LYMPH NODES NEGATIVE FOR CARCINOMA (0/1). 10. Lymph node, sentinel, biopsy, right - ONE OF ONE LYMPH NODES NEGATIVE FOR CARCINOMA (0/1). 11. Lymph node, sentinel, biopsy, right - ONE OF ONE LYMPH NODES NEGATIVE FOR CARCINOMA (0/1). 12. Lymph node, sentinel, biopsy, right - ONE OF ONE LYMPH NODES NEGATIVE FOR CARCINOMA (0/1). 13. Nipple Biopsy, right - BENIGN NIPPLE TISSUE. 14. Breast, simple mastectomy, right - NO RESIDUAL CARCINOMA IDENTIFIED. - TREATMENT EFFECT. - FIBROCYSTIC CHANGE. - ONE OF ONE LYMPH NODES NEGATIVE FOR CARCINOMA (0/1). - SEE ONCOLOGY TABLE.    09/14/2018 Cancer Staging    Staging form: Breast, AJCC 8th Edition - Pathologic stage from 09/14/2018: No Stage Recommended (ypT0, pN0, cM0, GX, ER: Not Assessed, PR: Not Assessed, HER2: Not Assessed) - Signed by Truitt Merle, MD on 09/23/2018    11/03/2018 - 12/10/2018 Radiation Therapy    Adjuvant Radiation with Dr. Isidore Moos      Anti-estrogen oral therapy    Tamoxifen 93m daily starting 12/27/18      CURRENT THERAPY:  -Maintenance Herceptin/Perjeta q3weeks to complete 1 year treatmentin 03/2019 -Start Tamoxifen 238mdaily in 12/2018   INTERVAL HISTORY:  JeKASIYA BURCKs here for a follow up and treatment. She is here alone. She notes from radiation she has right breast tenderness. She notes chest pain with breathing in. She was given gabapentin by Rad Onc. She denies SOB with laying down. She notes her skin is improving since completing radiation. It has not started peeling. I reviewed her medication list. She notes she is still taking Xanax nightly. She and her family are under a lot of stress. She notes she is not eligible for unemployment.   She notes before  her chemo she had some irregular periods. She notes she has still not had a   She feels she had a vascular change of her right distal upper leg this month with mild swelling. This has since resolved. She did not tell anyone about that.     REVIEW OF SYSTEMS:   Constitutional: Denies fevers, chills or abnormal weight loss Eyes: Denies blurriness of vision Ears, nose, mouth, throat, and face: Denies mucositis or sore throat Respiratory: Denies cough, dyspnea or wheezes Cardiovascular: Denies palpitation, chest discomfort or lower extremity swelling Gastrointestinal:  Denies nausea, heartburn or change in bowel habits Skin: Denies abnormal skin rashes Lymphatics: Denies new lymphadenopathy or easy bruising Neurological:Denies numbness, tingling or new weaknesses Behavioral/Psych: Mood is stable, no new changes  Breast: (+) right breast tenderness and skin erythema All other systems were reviewed with the patient and are negative.  MEDICAL HISTORY:  Past Medical History:  Diagnosis Date  . Abdominal pain, unspecified site   . Anxiety state, unspecified   [facility]   [facility]  . Complication of anesthesia    convulsions - when she has appendectomy about 12 years ago at [facility]  . Contact dermatitis and other eczema,  due to unspecified cause   . Essential hypertension, benign   . Family history of colon cancer   . Family history of melanoma   . Family history of prostate cancer   . GERD (gastroesophageal reflux disease)   . Hematuria, unspecified   . Hypertension   . Lower back pain   . Mixed hyperlipidemia   . Rosacea   . Unspecified symptom associated with female genital organs     SURGICAL HISTORY: Past Surgical History:  Procedure Laterality Date  . BREAST RECONSTRUCTION WITH PLACEMENT OF TISSUE EXPANDER AND ALLODERM Right 09/14/2018   Procedure: BREAST RECONSTRUCTION WITH PLACEMENT OF TISSUE EXPANDER AND ALLODERM;  Surgeon: Irene Limbo, MD;   Location: [facility];  Service: Plastics;  Laterality: Right;  . CESAREAN SECTION    . DIAGNOSTIC LAPAROSCOPY    . EYE SURGERY     lasix  . LAPAROSCOPIC APPENDECTOMY    . MASTECTOMY WITH RADIOACTIVE SEED GUIDED EXCISION AND AXILLARY SENTINEL LYMPH NODE BIOPSY Right 09/14/2018   Procedure: RIGHT NIPPLE SPARING MASTECTOMY WITH TARGETED DEEP RIGHT AXILLARY LYMPH NODE BIOPSY WITH RADIOACTIVE SEED LOCALIZATION AND RIGHT AXILLARY SENTINEL LYMPH NODE BIOPSY, FROZEN SECTION AND POSSIBLE COMPLETION AXILLARY LYMPH NODE DISSECTION INJECT BLUE DYE RIGHT BREAST;  Surgeon: Fanny Skates, MD;  Location: [place] [facility];  Service: General;  Laterality: Right;  . PORTACATH PLACEMENT Left 04/13/2018   Procedure: INSERTION PORT-A-CATH WITH [place];  Surgeon: Fanny Skates, MD;  Location: [place];  Service: General;  Laterality: Left;  . UTERINE FIBROID SURGERY      I have reviewed the social history and family history with the patient and they are unchanged from previous note.  ALLERGIES:  is allergic to metrogel [metronidazole]; other; paxil [paroxetine hcl]; prednisone; and zomig [zolmitriptan].  MEDICATIONS:  Current Outpatient Medications  Medication Sig Dispense Refill  . ALPRAZolam (XANAX) 0.5 MG tablet Take 1 tablet (0.5 mg total) by mouth at bedtime as needed for anxiety. 30 tablet 0  . atenolol (TENORMIN) 50 MG tablet Take 50 mg by mouth at bedtime.    . cyclobenzaprine (FLEXERIL) 5 MG tablet Take 1 tablet (5 mg total) by mouth 3 (three) times daily as needed for muscle spasms. 30 tablet 0  . gabapentin (NEURONTIN) 300 MG capsule Take 1 capsule (300 mg total) by mouth 3 (three) times daily. 45 capsule 1  . HYDROcodone-acetaminophen (NORCO) 5-325 MG tablet Take 1-2 tablets by mouth every 4 (four) hours as needed for moderate pain or severe pain. 40 tablet 0  . omeprazole (PRILOSEC) 20 MG capsule Take 1 capsule (20 mg total) by mouth 2 (two) times daily before a meal. 60 capsule 2  . potassium chloride  SA (K-DUR,KLOR-CON) 20 MEQ tablet TAKE 1 TABLET(20 MEQ) BY MOUTH TWICE DAILY 30 tablet 0  . tamoxifen (NOLVADEX) 20 MG tablet Take 1 tablet (20 mg total) by mouth daily. 30 tablet 2  . traMADol (ULTRAM) 50 MG tablet Take 1 tablet BID PRN pain 45 tablet 0  . venlafaxine (EFFEXOR) 37.5 MG tablet Take 1 tablet (37.5 mg total) by mouth 2 (two) times daily. 30 tablet 3   No current facility-administered medications for this visit.    Facility-Administered Medications Ordered in Other Visits  Medication Dose Route Frequency Provider Last Rate Last Dose  . heparin lock flush 100 unit/mL  500 Units Intracatheter Once PRN Truitt Merle, MD      . pertuzumab (PERJETA) 420 mg in sodium chloride 0.9 % 250 mL chemo infusion  420 mg Intravenous Once [place],  Krista Blue, MD 528 mL/hr at 12/17/18 1208 420 mg at 12/17/18 1208  . sodium chloride flush (NS) 0.9 % injection 10 mL  10 mL Intracatheter PRN Truitt Merle, MD        PHYSICAL EXAMINATION: ECOG PERFORMANCE STATUS: 1 - Symptomatic but completely ambulatory  Vitals:   12/17/18 1011  BP: (!) 144/67  Pulse: 74  Resp: 18  Temp: (!) 96.8 F (36 C)  SpO2: 100%   Filed Weights   12/17/18 1011  Weight: 152 lb 14.4 oz (69.4 kg)    GENERAL:alert, no distress and comfortable EYES: normal, Conjunctiva are pink and non-injected, sclera clear OROPHARYNX:no exudate, no erythema and lips, buccal mucosa, and tongue normal  NECK: supple, thyroid normal size, non-tender, without nodularity LYMPH:  no palpable lymphadenopathy in the cervical, axillary or inguinal LUNGS: clear to auscultation and percussion with normal breathing effort HEART: regular rate & rhythm and no murmurs and no lower extremity edema ABDOMEN:abdomen soft, non-tender and normal bowel sounds Musculoskeletal:no cyanosis of digits and no clubbing  NEURO: alert & oriented x 3 with fluent speech, no focal motor/sensory deficits BREAST: s/p right mastectomy with tissue expender, diffuse skin erythema from  radiation, no skin ulcers   LABORATORY DATA:  I have reviewed the data as listed CBC Latest Ref Rng & Units 12/17/2018 11/26/2018 11/05/2018  WBC 4.0 - 10.5 K/uL 4.1 4.0 4.5  Hemoglobin 12.0 - 15.0 g/dL 12.1 12.0 11.7(L)  Hematocrit 36.0 - 46.0 % 34.5(L) 33.1(L) 32.9(L)  Platelets 150 - 400 K/uL 154 151 162     CMP Latest Ref Rng & Units 12/17/2018 11/26/2018 11/05/2018  Glucose 70 - 99 mg/dL 135(H) 103(H) 102(H)  BUN 6 - 20 mg/dL 11 10 15   Creatinine 0.44 - 1.00 mg/dL 0.74 0.65 0.67  Sodium 135 - 145 mmol/L 142 142 141  Potassium 3.5 - 5.1 mmol/L 3.1(L) 3.6 3.5  Chloride 98 - 111 mmol/L 104 106 107  CO2 22 - 32 mmol/L 26 28 22   Calcium 8.9 - 10.3 mg/dL 9.6 9.3 9.1  Total Protein 6.5 - 8.1 g/dL 7.4 7.4 7.2  Total Bilirubin 0.3 - 1.2 mg/dL 0.5 0.4 0.4  Alkaline Phos 38 - 126 U/L 130(H) 126 105  AST 15 - 41 U/L 18 18 17   ALT 0 - 44 U/L 11 12 13       RADIOGRAPHIC STUDIES: I have personally reviewed the radiological images as listed and agreed with the findings in the report. No results found.   ASSESSMENT & PLAN:  Claire Guzman is a 53 y.o. female with   1.Malignant neoplasm of upper-inner quadrant of right breast in female, invasive ductal carcinoma, cT2N1M0 stage IB, ER+/PR+/HER2+, G3, ypT0N0 -She was diagnosed in 02/2018. Given the size of her mass and LN involvement, which predicts highrisk of recurrence.  -She is s/p neoadjuvantchemoof 6 cyclesTCHP, right breast mastectomyandaxillary lymph node dissectionon 09/14/18 and adjuvant radiation.  -I discussedthat complete pathological response predict good outcome, her risk of recurrence is small. -She is currently on maintenance Herceptinand Perjeta(due to node positive disease at diagnosis)to complete 1 year of treatment in 03/2019. She is tolerating well with mild diarrhea.  -She is recovering well from radiation. She plans to contact Dr. Iran Planas to schedule her reconstruction surgery.  -We discussed given her ER/PR  positive disease, adjuvant antiestrogen therapy is recommended to reduce her risk of distant breast cancer recurrence. She is likely pre or peri-menopausal and given her age I do not recommend BSO. Last period was in 03/2018. I discussed  the option of starting with Tamoxifen.   ---The potential side effects, which includes but not limited to, hot flash, skin and vaginal dryness, slightly increased risk of cardiovascular disease and cataract, small risk of thrombosis and endometrial cancer, were discussed with her in great details. She is agreeable.  -I called in Tamoxifen to start in 12/2018.  -Will discuss the option more anti-her2 treatment with Niratinib after her Herceptin maintenance therapy.  -Labs reviewed, CBC WNL, CMP WNL. CA 27.29. Overall adequate to proceed with Herceptin and Perjeta today. -recent ECHO showed EF 55-60%, slightly lower than before, I asked Dr. Haroldine Laws to review and he feels no issues with continuing Herceptin, will repeat echo in 3 months. I discussed with pt  -F/u in 9 weeks   2. Geneticsshowed no pathogenic mutations.   3. Anxiety  -She has history of anxiety, currently on Xanax as needed -Stable, she has been stressed with her family and unemployment lately.   4. HTN -She will continue atenolol. -BP is well controlled, but elevated today at 144/67 (12/17/18)  5. Bilateral small lung nodules -Her staging CT scan showed bilateral multiple small lung nodules, 3 to 4 mm, indeterminate, likely benign. She is a never smoker -Plan tomonitorrepeat CT chest without contrastin 3-6 months  6. Diarrhea  -secondary to Perjeta  -I advised her to take 2 imodium every morning and then continue as needed through out day up to 8 tabs.  -improved lately   Plan: -I called in Tamoxifen today to start in June  -Lab reviewed, will proceed with Herceptin/Perjetatoday, and continue every 3 weeks -Herceptin and Perjeta in 6, 9 and 12 weeks  -Lab, flush and f/u in  9 weeks -CT chest wo contrast in 3 weeks, I will call her with the results     No problem-specific Assessment & Plan notes found for this encounter.   Orders Placed This Encounter  Procedures  . CT Chest Wo Contrast    Standing Status:   Future    Standing Expiration Date:   12/17/2019    Order Specific Question:   Is patient pregnant?    Answer:   No    Order Specific Question:   Preferred imaging location?    Answer:   [facility]    Order Specific Question:   Radiology Contrast Protocol - do NOT remove file path    Answer:   [email or web address]   All questions were answered. The patient knows to call the clinic with any problems, questions or concerns. No barriers to learning was detected. I spent 20 minutes counseling the patient face to face. The total time spent in the appointment was 25 minutes and more than 50% was on counseling and review of test results     Truitt Merle, MD 12/17/2018   I, Joslyn Devon, am acting as scribe for Truitt Merle, MD.   I have reviewed the above documentation for accuracy and completeness, and I agree with the above.

## 2018-12-16 NOTE — Telephone Encounter (Signed)
-----   Message from Truitt Merle, MD sent at 12/15/2018 12:51 PM EDT ----- Thanks much Dan.  Malachy Mood, please let pt know, thanks  Krista Blue  ----- Message ----- From: Jolaine Artist, MD Sent: 12/15/2018  12:09 PM EDT To: Truitt Merle, MD  Echo looks good!she is good to go.  ----- Message ----- From: Truitt Merle, MD Sent: 12/13/2018   5:08 PM EDT To: Jolaine Artist, MD  Hi Dan,  Could you do me a favor to review her 3 echo since 02/2018? She started chemo and herceptin in 03/2018 for breast cancer, plan to complete one year Herceptin in Sep 2020. Her ET slightly dropped from 60-65% to 55-60% on recent echo. She is 40, healthy, history of HTN and family history of heart failure. Let me know if you need to see her, and I will make a referral.  Thanks much  Krista Blue

## 2018-12-17 ENCOUNTER — Encounter: Payer: Self-pay | Admitting: Hematology

## 2018-12-17 ENCOUNTER — Inpatient Hospital Stay: Payer: BC Managed Care – PPO

## 2018-12-17 ENCOUNTER — Inpatient Hospital Stay (HOSPITAL_BASED_OUTPATIENT_CLINIC_OR_DEPARTMENT_OTHER): Payer: BC Managed Care – PPO | Admitting: Hematology

## 2018-12-17 ENCOUNTER — Other Ambulatory Visit: Payer: Self-pay

## 2018-12-17 VITALS — BP 144/67 | HR 74 | Temp 96.8°F | Resp 18 | Ht 64.0 in | Wt 152.9 lb

## 2018-12-17 VITALS — BP 128/61 | HR 76 | Temp 98.2°F

## 2018-12-17 DIAGNOSIS — C50211 Malignant neoplasm of upper-inner quadrant of right female breast: Secondary | ICD-10-CM | POA: Diagnosis not present

## 2018-12-17 DIAGNOSIS — Z800 Family history of malignant neoplasm of digestive organs: Secondary | ICD-10-CM

## 2018-12-17 DIAGNOSIS — Z170 Estrogen receptor positive status [ER+]: Secondary | ICD-10-CM

## 2018-12-17 DIAGNOSIS — I10 Essential (primary) hypertension: Secondary | ICD-10-CM | POA: Diagnosis not present

## 2018-12-17 DIAGNOSIS — F419 Anxiety disorder, unspecified: Secondary | ICD-10-CM

## 2018-12-17 DIAGNOSIS — Z808 Family history of malignant neoplasm of other organs or systems: Secondary | ICD-10-CM

## 2018-12-17 DIAGNOSIS — Z95828 Presence of other vascular implants and grafts: Secondary | ICD-10-CM

## 2018-12-17 DIAGNOSIS — R918 Other nonspecific abnormal finding of lung field: Secondary | ICD-10-CM

## 2018-12-17 DIAGNOSIS — Z5111 Encounter for antineoplastic chemotherapy: Secondary | ICD-10-CM | POA: Diagnosis not present

## 2018-12-17 DIAGNOSIS — Z79899 Other long term (current) drug therapy: Secondary | ICD-10-CM

## 2018-12-17 DIAGNOSIS — Z510 Encounter for antineoplastic radiation therapy: Secondary | ICD-10-CM | POA: Diagnosis not present

## 2018-12-17 DIAGNOSIS — Z5112 Encounter for antineoplastic immunotherapy: Secondary | ICD-10-CM | POA: Diagnosis not present

## 2018-12-17 DIAGNOSIS — R197 Diarrhea, unspecified: Secondary | ICD-10-CM

## 2018-12-17 DIAGNOSIS — Z79810 Long term (current) use of selective estrogen receptor modulators (SERMs): Secondary | ICD-10-CM

## 2018-12-17 LAB — CBC WITH DIFFERENTIAL (CANCER CENTER ONLY)
Abs Immature Granulocytes: 0.01 10*3/uL (ref 0.00–0.07)
Basophils Absolute: 0 10*3/uL (ref 0.0–0.1)
Basophils Relative: 1 %
Eosinophils Absolute: 0.1 10*3/uL (ref 0.0–0.5)
Eosinophils Relative: 3 %
HCT: 34.5 % — ABNORMAL LOW (ref 36.0–46.0)
Hemoglobin: 12.1 g/dL (ref 12.0–15.0)
Immature Granulocytes: 0 %
Lymphocytes Relative: 18 %
Lymphs Abs: 0.7 10*3/uL (ref 0.7–4.0)
MCH: 32.4 pg (ref 26.0–34.0)
MCHC: 35.1 g/dL (ref 30.0–36.0)
MCV: 92.2 fL (ref 80.0–100.0)
Monocytes Absolute: 0.3 10*3/uL (ref 0.1–1.0)
Monocytes Relative: 7 %
Neutro Abs: 2.9 10*3/uL (ref 1.7–7.7)
Neutrophils Relative %: 71 %
Platelet Count: 154 10*3/uL (ref 150–400)
RBC: 3.74 MIL/uL — ABNORMAL LOW (ref 3.87–5.11)
RDW: 11.9 % (ref 11.5–15.5)
WBC Count: 4.1 10*3/uL (ref 4.0–10.5)
nRBC: 0 % (ref 0.0–0.2)

## 2018-12-17 LAB — CMP (CANCER CENTER ONLY)
ALT: 11 U/L (ref 0–44)
AST: 18 U/L (ref 15–41)
Albumin: 4.1 g/dL (ref 3.5–5.0)
Alkaline Phosphatase: 130 U/L — ABNORMAL HIGH (ref 38–126)
Anion gap: 12 (ref 5–15)
BUN: 11 mg/dL (ref 6–20)
CO2: 26 mmol/L (ref 22–32)
Calcium: 9.6 mg/dL (ref 8.9–10.3)
Chloride: 104 mmol/L (ref 98–111)
Creatinine: 0.74 mg/dL (ref 0.44–1.00)
GFR, Est AFR Am: >60 mL/min (ref >60)
GFR, Estimated: >60 mL/min (ref >60)
Glucose, Bld: 135 mg/dL — ABNORMAL HIGH (ref 70–99)
Potassium: 3.1 mmol/L — ABNORMAL LOW (ref 3.5–5.1)
Sodium: 142 mmol/L (ref 135–145)
Total Bilirubin: 0.5 mg/dL (ref 0.3–1.2)
Total Protein: 7.4 g/dL (ref 6.5–8.1)

## 2018-12-17 MED ORDER — DIPHENHYDRAMINE HCL 25 MG PO CAPS
50.0000 mg | ORAL_CAPSULE | Freq: Once | ORAL | Status: AC
  Administered 2018-12-17: 50 mg via ORAL

## 2018-12-17 MED ORDER — DIPHENHYDRAMINE HCL 25 MG PO CAPS
ORAL_CAPSULE | ORAL | Status: AC
  Filled 2018-12-17: qty 2

## 2018-12-17 MED ORDER — SODIUM CHLORIDE 0.9% FLUSH
10.0000 mL | INTRAVENOUS | Status: DC | PRN
  Administered 2018-12-17: 10 mL
  Filled 2018-12-17: qty 10

## 2018-12-17 MED ORDER — ACETAMINOPHEN 325 MG PO TABS
ORAL_TABLET | ORAL | Status: AC
  Filled 2018-12-17: qty 2

## 2018-12-17 MED ORDER — SODIUM CHLORIDE 0.9% FLUSH
10.0000 mL | INTRAVENOUS | Status: DC | PRN
  Administered 2018-12-17: 13:00:00 10 mL
  Filled 2018-12-17: qty 10

## 2018-12-17 MED ORDER — TAMOXIFEN CITRATE 20 MG PO TABS: 20.0000 mg | ORAL_TABLET | Freq: Every day | ORAL | 2 refills | Status: DC

## 2018-12-17 MED ORDER — SODIUM CHLORIDE 0.9 % IV SOLN
420.0000 mg | Freq: Once | INTRAVENOUS | Status: AC
  Administered 2018-12-17: 420 mg via INTRAVENOUS
  Filled 2018-12-17: qty 14

## 2018-12-17 MED ORDER — ACETAMINOPHEN 325 MG PO TABS
650.0000 mg | ORAL_TABLET | Freq: Once | ORAL | Status: AC
  Administered 2018-12-17: 650 mg via ORAL

## 2018-12-17 MED ORDER — HEPARIN SOD (PORK) LOCK FLUSH 100 UNIT/ML IV SOLN
500.0000 [IU] | Freq: Once | INTRAVENOUS | Status: AC | PRN
  Administered 2018-12-17: 500 [IU]
  Filled 2018-12-17: qty 5

## 2018-12-17 MED ORDER — SODIUM CHLORIDE 0.9 % IV SOLN
Freq: Once | INTRAVENOUS | Status: AC
  Administered 2018-12-17: 11:00:00 via INTRAVENOUS
  Filled 2018-12-17: qty 250

## 2018-12-17 MED ORDER — TRASTUZUMAB CHEMO 150 MG IV SOLR
450.0000 mg | Freq: Once | INTRAVENOUS | Status: AC
  Administered 2018-12-17: 450 mg via INTRAVENOUS
  Filled 2018-12-17: qty 21.43

## 2018-12-17 NOTE — Patient Instructions (Signed)

## 2018-12-17 NOTE — Progress Notes (Signed)
Patient refused to be observed for 30 minutes post Perjeta infusion. Education provided.

## 2018-12-17 NOTE — Patient Instructions (Signed)
[facility] Discharge Instructions for Patients Receiving Chemotherapy  Today you received the following chemotherapy agents Trastuzumab (HERCEPTIN) & Pertuzumab (PERJETA).  To help prevent nausea and vomiting after your treatment, we encourage you to take your nausea medication as prescribed.   If you develop nausea and vomiting that is not controlled by your nausea medication, call the clinic.   BELOW ARE SYMPTOMS THAT SHOULD BE REPORTED IMMEDIATELY:  *FEVER GREATER THAN 100.5 F  *CHILLS WITH OR WITHOUT FEVER  NAUSEA AND VOMITING THAT IS NOT CONTROLLED WITH YOUR NAUSEA MEDICATION  *UNUSUAL SHORTNESS OF BREATH  *UNUSUAL BRUISING OR BLEEDING  TENDERNESS IN MOUTH AND THROAT WITH OR WITHOUT PRESENCE OF ULCERS  *URINARY PROBLEMS  *BOWEL PROBLEMS  UNUSUAL RASH Items with * indicate a potential emergency and should be followed up as soon as possible.  Feel free to call the clinic should you have any questions or concerns. The clinic phone number is [phone].  Please show the [facility] at check-in to the Emergency Department and triage nurse.  Coronavirus (COVID-19) Are you at risk?  Are you at risk for the Coronavirus (COVID-19)?  To be considered HIGH RISK for Coronavirus (COVID-19), you have to meet the following criteria:  . Traveled to [place], [place], [place], [place] or [place]; or in the [place] to [place], [place], [place], or [place]; and have fever, cough, and shortness of breath within the last 2 weeks of travel OR . Been in close contact with a person diagnosed with COVID-19 within the last 2 weeks and have fever, cough, and shortness of breath . IF YOU DO NOT MEET THESE CRITERIA, YOU ARE CONSIDERED LOW RISK FOR COVID-19.  What to do if you are HIGH RISK for COVID-19?  Marland Kitchen If you are having a medical emergency, call 911. . Seek medical care right away. Before you go to a doctor's office, urgent care or emergency department,  call ahead and tell them about your recent travel, contact with someone diagnosed with COVID-19, and your symptoms. You should receive instructions from your physician's office regarding next steps of care.  . When you arrive at healthcare provider, tell the healthcare staff immediately you have returned from visiting [place], [place], [place], [place] or [place]; or traveled in the [place] to [place], [place], [place], or [place]; in the last two weeks or you have been in close contact with a person diagnosed with COVID-19 in the last 2 weeks.   . Tell the health care staff about your symptoms: fever, cough and shortness of breath. . After you have been seen by a medical provider, you will be either: o Tested for (COVID-19) and discharged home on quarantine except to seek medical care if symptoms worsen, and asked to  - Stay home and avoid contact with others until you get your results (4-5 days)  - Avoid travel on public transportation if possible (such as bus, train, or airplane) or o Sent to the Emergency Department by EMS for evaluation, COVID-19 testing, and possible admission depending on your condition and test results.  What to do if you are LOW RISK for COVID-19?  Reduce your risk of any infection by using the same precautions used for avoiding the common cold or flu:  Marland Kitchen Wash your hands often with soap and warm water for at least 20 seconds.  If soap and water are not readily available, use an alcohol-based hand sanitizer with at least 60% alcohol.  Marland Kitchen  If coughing or sneezing, cover your mouth and nose by coughing or sneezing into the elbow areas of your shirt or coat, into a tissue or into your sleeve (not your hands). . Avoid shaking hands with others and consider head nods or verbal greetings only. . Avoid touching your eyes, nose, or mouth with unwashed hands.  . Avoid close contact with people who are sick. . Avoid places or events with large numbers of people in one  location, like concerts or sporting events. . Carefully consider travel plans you have or are making. . If you are planning any travel outside or inside the [place], visit the CDC's Travelers' Health webpage for the latest health notices. . If you have some symptoms but not all symptoms, continue to monitor at home and seek medical attention if your symptoms worsen. . If you are having a medical emergency, call 911.   ADDITIONAL HEALTHCARE OPTIONS FOR PATIENTS  [facility] / e-Visit: [email or web address]         [facility]: [phone]  [facility]: [phone]                   [facility]: [phone]   

## 2018-12-18 ENCOUNTER — Other Ambulatory Visit: Payer: Self-pay | Admitting: Hematology

## 2018-12-18 DIAGNOSIS — C50211 Malignant neoplasm of upper-inner quadrant of right female breast: Secondary | ICD-10-CM

## 2018-12-18 DIAGNOSIS — Z170 Estrogen receptor positive status [ER+]: Secondary | ICD-10-CM

## 2018-12-18 LAB — CANCER ANTIGEN 27.29: CA 27.29: 29.7 U/mL (ref 0.0–38.6)

## 2018-12-18 MED ORDER — POTASSIUM CHLORIDE CRYS ER 20 MEQ PO TBCR: EXTENDED_RELEASE_TABLET | ORAL | 0 refills | Status: DC

## 2018-12-21 ENCOUNTER — Telehealth: Payer: Self-pay | Admitting: Hematology

## 2018-12-21 NOTE — Telephone Encounter (Signed)
Scheduled appt per 5/22 los. °

## 2018-12-22 ENCOUNTER — Telehealth: Payer: Self-pay

## 2018-12-22 ENCOUNTER — Encounter: Payer: Self-pay | Admitting: General Practice

## 2018-12-22 NOTE — Progress Notes (Signed)
[facility] CSW Progress Notes  Patient approved for $2500 grant to help w unpaid medical bills.  Patient will receive award letter and needs to submit bills directly to [employer] in [place] for payment.   Edwyna Shell, LCSW Clinical Social Worker Phone:  [phone]

## 2018-12-22 NOTE — Telephone Encounter (Signed)
Spoke with patient to verify she received message from Dr. Burr Medico regarding potassium being low and increasing.  She states she did and got her potassium.  She also asked about when she would have her CT of the chest.  Look like we requested by 6/8.  She was given the number to [employer] to call if she does not here from them by the beginning of the week.

## 2018-12-22 NOTE — Telephone Encounter (Signed)
-----   Message from Truitt Merle, MD sent at 12/18/2018  6:19 PM EDT ----- I called pt and left a message: K low, I refilled her KCL, and recommend her to take 1 tab daily, or increase 1 tab daily if she is already on. Please check and make sure she received the message, thanks  Truitt Merle  12/18/2018

## 2018-12-28 NOTE — Progress Notes (Signed)
  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  Cancer Staging Malignant neoplasm of upper-inner quadrant of right breast in female, estrogen receptor positive ([place]) Staging form: Breast, AJCC 8th Edition - Clinical stage from 03/08/2018: Stage IB (cT2, cN1, cM0, G3, ER+, PR+, HER2+) - Signed by Truitt Merle, MD on 03/16/2018 - Pathologic stage from 09/14/2018: No Stage Recommended (ypT0, pN0, cM0, GX, ER: Not Assessed, PR: Not Assessed, HER2: Not Assessed) - Signed by Truitt Merle, MD on 09/23/2018  Intent: Curative  Radiation Treatment Dates: 11/03/2018 through 12/10/2018 [facility] Total Dose Dose per Fx Completed Fx Beam Energies  Breast: CW_Rt 3D 50.4/50.4 1.8 28/28 10X, 6X  Breast: CW_Rt_SCV_PAB 3D 50.4/50.4 1.8 28/28 10X   Narrative: The patient tolerated radiation therapy relatively well. She experienced increased fatigue and some expected skin irritation with erythema to the chest wall and SCV/PAB regions. Dry desquamation was present in the UIQ by the end of treatment. She is applying Radiaplex gel to her skin as directed. She also developed pain, more-so deep in the right chest and axillary soft tissue than in the skin, and she declined bolus for a brief portion of her treatment course due to the pain. She was given a prescription for gabapentin and resumed bolus on 5/6. The decision was made not to boost the chest wall.  Plan: The patient will follow-up with radiation oncology in one month.   ________________________________________________  Eppie Gibson, MD  This document serves as a record of services personally performed by Eppie Gibson, MD. It was created on her behalf by Rae Lips, a trained medical scribe. The creation of this record is based on the scribe's personal observations and the provider's statements to them. This document has been checked and approved by the attending provider.

## 2019-01-03 ENCOUNTER — Telehealth: Payer: Self-pay | Admitting: *Deleted

## 2019-01-03 NOTE — Telephone Encounter (Signed)
Please let pt know OK to hold tamoxifen now, and I will see her when she comes in this Friday.   Truitt Merle MD

## 2019-01-03 NOTE — Telephone Encounter (Signed)
Received vm message from patient stating she is having side effects from the tamoxifen that are troubling her. TCT patient and spoke with her. She states that she is noticing her heart 'racing/fluttering' almost every day, decreased sense of taste-similar to what she experienced during her chemo therapy; she is noticing that very mildly spicy foods burns her throat; she is not sleeping well at night due to hot flashes. 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  She did not take the tamoxifen today.  She would like to know what to do at this point. She is also asking if the Zantac she took for awhile hada causituve effect to her cancer.  Please advise

## 2019-01-04 ENCOUNTER — Ambulatory Visit (HOSPITAL_COMMUNITY)
Admission: RE | Admit: 2019-01-04 | Discharge: 2019-01-04 | Disposition: A | Discharge status: Home / Self Care | Payer: BC Managed Care – PPO | Source: Ambulatory Visit | Attending: Hematology | Admitting: Hematology

## 2019-01-04 ENCOUNTER — Encounter (HOSPITAL_COMMUNITY): Payer: Self-pay

## 2019-01-04 ENCOUNTER — Other Ambulatory Visit: Payer: Self-pay

## 2019-01-04 DIAGNOSIS — R918 Other nonspecific abnormal finding of lung field: Secondary | ICD-10-CM | POA: Insufficient documentation

## 2019-01-04 DIAGNOSIS — I700 Atherosclerosis of aorta: Secondary | ICD-10-CM | POA: Diagnosis not present

## 2019-01-05 NOTE — Telephone Encounter (Signed)
Received call back from patient and advised pt that it is ok for patient to stop tamoxifen for now.  Pt voiced undertsanding . She is aware that this will be discussed at her appt on 01/07/19

## 2019-01-05 NOTE — Progress Notes (Signed)
[place]   [phone] [phone]   Clinic Follow up Note   Patient Care Team: Lujean Amel, MD as PCP - General (Family Medicine) Fanny Skates, MD as Consulting Physician (General Surgery) Truitt Merle, MD as Consulting Physician (Hematology) Eppie Gibson, MD as Attending Physician (Radiation Oncology) Mauro Kaufmann, RN as Oncology Nurse Navigator Rockwell Germany, RN as Oncology Nurse Navigator  Date of Service:  01/07/2019  CHIEF COMPLAINT: F/u for right breast cancer  SUMMARY OF ONCOLOGIC HISTORY: Oncology History Overview Note  Cancer Staging Malignant neoplasm of upper-inner quadrant of right breast in female, estrogen receptor positive ([place]) Staging form: Breast, AJCC 8th Edition - Clinical stage from 03/08/2018: Stage IB (cT2, cN1, cM0, G3, ER+, PR+, HER2+) - Signed by Truitt Merle, MD on 03/16/2018     Malignant neoplasm of upper-inner quadrant of right breast in female, estrogen receptor positive ([place])  02/24/2018 Mammogram   screening mammogram on 02/24/2018 that was benign   03/04/2018 Mammogram   diagnostic mammogram on 03/04/2018 due to a palpable mass on her right breast. Diagnostic mammogram, breast US revealed and biopsy revealed suspicious mass in the right breast at 2:30 at the palpable site of concern and one suspicious right axillary LN.   03/08/2018 Cancer Staging   Staging form: Breast, AJCC 8th Edition - Clinical stage from 03/08/2018: Stage IB (cT2, cN1, cM0, G3, ER+, PR+, HER2+) - Signed by Truitt Merle, MD on 03/16/2018   03/08/2018 Receptors her2   Her2 positive, ER 90% PR 5% and Ki67 60%   03/08/2018 Pathology Results   Diagnosis 1. Breast, right, needle core biopsy, 2:30 - INVASIVE DUCTAL CARCINOMA, GRADE 3. - LYMPHOVASCULAR SPACE INVOLVEMENT BY TUMOR. 2. Lymph node, needle/core biopsy, right axilla - METASTATIC CARCINOMA INVOLVING SCANT LYMPHOID TISSUE.   03/10/2018 Initial Diagnosis   Malignant neoplasm of  upper-inner quadrant of right breast in female, estrogen receptor positive ([place])   03/20/2018 Imaging   MRI brain 03/20/18 IMPRESSION: No evidence of metastatic disease.  Normal appearance of brain. Slightly heterogeneous marrow pattern of the upper cervical spine, nonspecific but likely benign, especially in the setting of early stage disease.    03/24/2018 Echocardiogram   Baseline ECHO 03/24/18 Study Conclusions - Left ventricle: The cavity size was normal. Wall thickness was   increased in a pattern of mild LVH. Systolic function was normal.   The estimated ejection fraction was in the range of 60% to 65%.   Wall motion was normal; there were no regional wall motion   abnormalities. Doppler parameters are consistent with abnormal   left ventricular relaxation (grade 1 diastolic dysfunction). - Right ventricle: The cavity size was mildly dilated. Wall   thickness was normal.   03/25/2018 Imaging   MRI Breast B/l 03/25/18 IMPRESSION: 1. The patient's primary malignancy in the posterior right breast measures 3.2 x 2 x 2.6 cm with apparent invasion of the underlying pectoralis muscle. Numerous surrounding abnormal enhancing masses, consistent with satellite lesions, extend from 11 o'clock in the right breast into the inferolateral right breast with a total span of suspected disease measuring 4.5 x 4.6 x 6.4 cm in AP, transverse, and craniocaudal dimension. The suspected extent of disease involves the superior and inferolateral quadrants. The primary malignancy also extends just across midline into the superior medial quadrant. 2. No MRI evidence of malignancy in the left breast. 3. Known metastatic noted in the right axilla.   03/25/2018 Imaging   Whole body bone scan 03/25/18 IMPRESSION: No scintigraphic evidence skeletal  metastasis.   03/25/2018 Imaging   CT CAP W Contrast 03/25/18 IMPRESSION: 1. Mass within the deep aspect of the right breast along the pectoralis muscle  compatible with primary breast malignancy. 2. There is suggestion of two additional possible masses within the lateral aspect of the right breast versus nodular appearing breast tissue. Recommend dedicated evaluation with bilateral breast MRI. 3. Mildly thickened right axillary lymph node compatible with metastatic adenopathy, recently biopsied. 4. Multiple bilateral pulmonary nodules are indeterminate, potentially sequelae of prior infectious/inflammatory process. Recommend attention on follow-up. Consider follow-up CT in 6 months. 5. Portal venous gas is demonstrated within the left hepatic lobe. Additionally, there is wall thickening of the cecum and ascending colon with small amount of gas within the pericolonic vasculature. Constellation of findings is indeterminate in etiology however may be secondary to colitis at this location with considerations including infectious, inflammatory or ischemic etiologies. Correlate for recent procedure. If patient is not up-to-date for colonic screening, recommend further evaluation with colonoscopy to exclude the possibility of colonic mass within the cecum/ascending colon. 6. These results were called by telephone at the time of interpretation on 03/25/2018 at 9:31 am to Dr. Truitt Merle , who verbally acknowledged these results.   03/30/2018 - 08/11/2018 Chemotherapy   Neoadjuvant TCHP every 3 weeks starting 03/30/18. Completed 6 cycles of TCHP on 07/23/18.  Continued with maintenance Herceptin/Perjeta q3weeks to complete 1 year.    04/09/2018 Genetic Testing   Negative genetic testing on the [facility] Common Hereditary Cancers Panel. The Common Hereditary Cancers Panel offered by [facility] includes sequencing and/or deletion duplication testing of the following 47 genes: APC, ATM, AXIN2, BARD1, BMPR1A, BRCA1, BRCA2, BRIP1, CDH1, CDKN2A (p14ARF), CDKN2A (p16INK4a), CKD4, CHEK2, CTNNA1, DICER1, EPCAM (Deletion/duplication testing only), GREM1 (promoter region  deletion/duplication testing only), KIT, MEN1, MLH1, MSH2, MSH3, MSH6, MUTYH, NBN, NF1, NHTL1, PALB2, PDGFRA, PMS2, POLD1, POLE, PTEN, RAD50, RAD51C, RAD51D, SDHB, SDHC, SDHD, SMAD4, SMARCA4. STK11, TP53, TSC1, TSC2, and VHL.  The following genes were evaluated for sequence changes only: SDHA and HOXB13 c.251G>A variant only.   Genetic testing did detect a Variant of Unknown Significance (VUS) in the POLD1 gene called c.985C>T (p.Pro329Ser). At this time, it is unknown if this variant is associated with increased cancer risk or if this is a normal finding, but most variants such as this get reclassified to being inconsequential. It should not be used to make medical management decisions.   The report date is 04/09/2018.   07/24/2018 Breast MRI   IMPRESSION: 1. Resolution of the enhancing mass (known cancer) and small satellite lesions previously seen in the right breast following chemotherapy.  2. The metastatic lymph node in the right axilla has decreased in size.  3.  No MRI evidence of left breast malignancy.   09/14/2018 Surgery   RIGHT NIPPLE SPARING MASTECTOMY WITH TARGETED DEEP RIGHT AXILLARY LYMPH NODE BIOPSY WITH RADIOACTIVE SEED LOCALIZATION AND RIGHT AXILLARY SENTINEL LYMPH NODE BIOPSY by Dr. Dalbert Batman  09/14/18    09/14/2018 Pathology Results   Diagnosis 1. Lymph node, sentinel, biopsy, right axillary with radioactive seed - ONE OF ONE LYMPH NODES NEGATIVE FOR CARCINOMA (0/1). - MILD TREATMENT EFFECT. - BIOPSY SITE. 2. Lymph node, sentinel, biopsy, right axillary - ONE OF ONE LYMPH NODES NEGATIVE FOR CARCINOMA (0/1). 3. Lymph node, sentinel, biopsy, right - ONE OF ONE LYMPH NODES NEGATIVE FOR CARCINOMA (0/1). 4. Lymph node, sentinel, biopsy, right - ONE OF ONE LYMPH NODES NEGATIVE FOR CARCINOMA (0/1). 5. Lymph node, sentinel, biopsy, right - ONE OF  ONE LYMPH NODES NEGATIVE FOR CARCINOMA (0/1). 6. Lymph node, sentinel, biopsy, right - ONE OF ONE LYMPH NODES NEGATIVE FOR  CARCINOMA (0/1). 7. Lymph node, sentinel, biopsy, right - ONE OF ONE LYMPH NODES NEGATIVE FOR CARCINOMA (0/1). 8. Lymph node, sentinel, biopsy, right - ONE OF ONE LYMPH NODES NEGATIVE FOR CARCINOMA (0/1). 9. Lymph node, sentinel, biopsy, right - ONE OF ONE LYMPH NODES NEGATIVE FOR CARCINOMA (0/1). 10. Lymph node, sentinel, biopsy, right - ONE OF ONE LYMPH NODES NEGATIVE FOR CARCINOMA (0/1). 11. Lymph node, sentinel, biopsy, right - ONE OF ONE LYMPH NODES NEGATIVE FOR CARCINOMA (0/1). 12. Lymph node, sentinel, biopsy, right - ONE OF ONE LYMPH NODES NEGATIVE FOR CARCINOMA (0/1). 13. Nipple Biopsy, right - BENIGN NIPPLE TISSUE. 14. Breast, simple mastectomy, right - NO RESIDUAL CARCINOMA IDENTIFIED. - TREATMENT EFFECT. - FIBROCYSTIC CHANGE. - ONE OF ONE LYMPH NODES NEGATIVE FOR CARCINOMA (0/1). - SEE ONCOLOGY TABLE.   09/14/2018 Cancer Staging   Staging form: Breast, AJCC 8th Edition - Pathologic stage from 09/14/2018: No Stage Recommended (ypT0, pN0, cM0, GX, ER: Not Assessed, PR: Not Assessed, HER2: Not Assessed) - Signed by Truitt Merle, MD on 09/23/2018   11/03/2018 - 12/10/2018 Radiation Therapy   Adjuvant Radiation with Dr. Isidore Moos    12/27/2018 -  Anti-estrogen oral therapy   Tamoxifen '20mg'$  daily starting 12/27/18. Start Tamoxifen '20mg'$  daily in 12/27/2018 stopped after 1 week due to poor toleration.    01/04/2019 Imaging   CT chest  IMPRESSION: 1. Stable tiny subpleural and perifissural nodules bilaterally. No suspicious pulmonary nodule or mass. 2. No focal airspace disease in the right lung. 3.  Aortic Atherosclerois (ICD10-170.0)        CURRENT THERAPY:  -Maintenance Herceptin/Perjeta q3weeks to complete 1 year treatmentin 03/2019 -Start Tamoxifen '20mg'$  daily in 12/27/2018 stopped after 1 week due to poor toleration.   INTERVAL HISTORY:  Claire Guzman is here for a follow up and treatment. She presents to the clinic alone. She notes she stopped Tamoxifen after having heart  palpitations and racing. She notes her hot flashes were much worse. She notes her neuropathy has worsened. Since stopping her symptoms improved. She notes her last period was in 02/2018. Her mother and sister had hysterometry in her 45s.  She notes she is currently on Effexor 37.'5mg'$ .    REVIEW OF SYSTEMS:   Constitutional: Denies fevers, chills or abnormal weight loss (+) hot flashes  Eyes: Denies blurriness of vision Ears, nose, mouth, throat, and face: Denies mucositis or sore throat Respiratory: Denies cough, dyspnea or wheezes Cardiovascular: Denies chest discomfort or lower extremity swelling (+) heart palpitation Gastrointestinal:  Denies nausea, heartburn or change in bowel habits Skin: Denies abnormal skin rashes Lymphatics: Denies new lymphadenopathy or easy bruising Neurological: (+) neuropathy Behavioral/Psych: Mood is stable, no new changes  All other systems were reviewed with the patient and are negative.  MEDICAL HISTORY:  Past Medical History:  Diagnosis Date  . Abdominal pain, unspecified site   . Anxiety state, unspecified   . Complication of anesthesia    convulsions - when she has appendectomy about 12 years ago at [facility]  . Contact dermatitis and other eczema, due to unspecified cause   . Essential hypertension, benign   . Family history of colon cancer   . Family history of melanoma   . Family history of prostate cancer   . GERD (gastroesophageal reflux disease)   . Hematuria, unspecified   . Hypertension   . Lower back pain   .  Mixed hyperlipidemia   . Rosacea   . rt breast ca dx'd 01/2018   R breast cancer  . Unspecified symptom associated with female genital organs     SURGICAL HISTORY: Past Surgical History:  Procedure Laterality Date  . BREAST RECONSTRUCTION WITH PLACEMENT OF TISSUE EXPANDER AND ALLODERM Right 09/14/2018   Procedure: BREAST RECONSTRUCTION WITH PLACEMENT OF TISSUE EXPANDER AND ALLODERM;  Surgeon: Irene Limbo, MD;   Location: [facility];  Service: Plastics;  Laterality: Right;  . CESAREAN SECTION    . DIAGNOSTIC LAPAROSCOPY    . EYE SURGERY     lasix  . LAPAROSCOPIC APPENDECTOMY    . MASTECTOMY WITH RADIOACTIVE SEED GUIDED EXCISION AND AXILLARY SENTINEL LYMPH NODE BIOPSY Right 09/14/2018   Procedure: RIGHT NIPPLE SPARING MASTECTOMY WITH TARGETED DEEP RIGHT AXILLARY LYMPH NODE BIOPSY WITH RADIOACTIVE SEED LOCALIZATION AND RIGHT AXILLARY SENTINEL LYMPH NODE BIOPSY, FROZEN SECTION AND POSSIBLE COMPLETION AXILLARY LYMPH NODE DISSECTION INJECT BLUE DYE RIGHT BREAST;  Surgeon: Fanny Skates, MD;  Location: [facility];  Service: General;  Laterality: Right;  . PORTACATH PLACEMENT Left 04/13/2018   Procedure: INSERTION PORT-A-CATH WITH [place];  Surgeon: Fanny Skates, MD;  Location: [facility];  Service: General;  Laterality: Left;  . UTERINE FIBROID SURGERY      I have reviewed the social history and family history with the patient and they are unchanged from previous note.  ALLERGIES:  is allergic to metrogel [metronidazole]; other; paxil [paroxetine hcl]; prednisone; and zomig [zolmitriptan].  MEDICATIONS:  Current Outpatient Medications  Medication Sig Dispense Refill  . ALPRAZolam (XANAX) 0.5 MG tablet Take 1 tablet (0.5 mg total) by mouth at bedtime as needed for anxiety. 30 tablet 0  . atenolol (TENORMIN) 50 MG tablet Take 50 mg by mouth at bedtime.    . cyclobenzaprine (FLEXERIL) 5 MG tablet Take 1 tablet (5 mg total) by mouth 3 (three) times daily as needed for muscle spasms. 30 tablet 0  . gabapentin (NEURONTIN) 300 MG capsule Take 1 capsule (300 mg total) by mouth 3 (three) times daily. 45 capsule 1  . HYDROcodone-acetaminophen (NORCO) 5-325 MG tablet Take 1-2 tablets by mouth every 4 (four) hours as needed for moderate pain or severe pain. 40 tablet 0  . omeprazole (PRILOSEC) 20 MG capsule Take 1 capsule (20 mg total) by mouth 2 (two) times daily before a meal. 60 capsule 2  . potassium chloride  (KLOR-CON) 20 MEQ packet Take 20 mEq by mouth 2 (two) times daily. Twice daily for 7 days then once daily 30 packet 1  . potassium chloride SA (K-DUR) 20 MEQ tablet Take one tab dialy 30 tablet 0  . tamoxifen (NOLVADEX) 20 MG tablet Take 1 tablet (20 mg total) by mouth daily. 30 tablet 2  . traMADol (ULTRAM) 50 MG tablet Take 1 tablet BID PRN pain 45 tablet 0  . venlafaxine (EFFEXOR) 37.5 MG tablet Take 1 tablet (37.5 mg total) by mouth 2 (two) times daily. 30 tablet 3   No current facility-administered medications for this visit.     PHYSICAL EXAMINATION: ECOG PERFORMANCE STATUS: 1 - Symptomatic but completely ambulatory  Vitals with BMI 01/07/2019  Height   Weight   BMI   Systolic 791  Diastolic 61  Pulse 68  Respirations 18    GENERAL:alert, no distress and comfortable SKIN: skin color, texture, turgor are normal, no rashes or significant lesions EYES: normal, Conjunctiva are pink and non-injected, sclera clear  NECK: supple, thyroid normal size, non-tender, without nodularity LYMPH:  no  palpable lymphadenopathy in the cervical, axillary  LUNGS: clear to auscultation and percussion with normal breathing effort HEART: regular rate & rhythm and no murmurs and no lower extremity edema ABDOMEN:abdomen soft, non-tender and normal bowel sounds Musculoskeletal:no cyanosis of digits and no clubbing  NEURO: alert & oriented x 3 with fluent speech, no focal motor/sensory deficits  LABORATORY DATA:  I have reviewed the data as listed CBC Latest Ref Rng & Units 01/07/2019 12/17/2018 11/26/2018  WBC 4.0 - 10.5 K/uL 4.0 4.1 4.0  Hemoglobin 12.0 - 15.0 g/dL 12.1 12.1 12.0  Hematocrit 36.0 - 46.0 % 34.9(L) 34.5(L) 33.1(L)  Platelets 150 - 400 K/uL 158 154 151     CMP Latest Ref Rng & Units 01/07/2019 12/17/2018 11/26/2018  Glucose 70 - 99 mg/dL 126(H) 135(H) 103(H)  BUN 6 - 20 mg/dL '9 11 10  '$ Creatinine 0.44 - 1.00 mg/dL 0.72 0.74 0.65  Sodium 135 - 145 mmol/L 144 142 142  Potassium 3.5 -  5.1 mmol/L 3.1(L) 3.1(L) 3.6  Chloride 98 - 111 mmol/L 108 104 106  CO2 22 - 32 mmol/L '23 26 28  '$ Calcium 8.9 - 10.3 mg/dL 9.1 9.6 9.3  Total Protein 6.5 - 8.1 g/dL 7.5 7.4 7.4  Total Bilirubin 0.3 - 1.2 mg/dL 0.6 0.5 0.4  Alkaline Phos 38 - 126 U/L 126 130(H) 126  AST 15 - 41 U/L '17 18 18  '$ ALT 0 - 44 U/L '12 11 12      '$ RADIOGRAPHIC STUDIES: I have personally reviewed the radiological images as listed and agreed with the findings in the report. No results found.   ASSESSMENT & PLAN:  Claire Guzman is a 53 y.o. female with   1.Malignant neoplasm of upper-inner quadrant of right breast in female, invasive ductal carcinoma, cT2N1M0 stage IB, ER+/PR+/HER2+, G3, ypT0N0 -She was diagnosed in 02/2018. Given the size of her mass and LN involvement, predicts highrisk of recurrence.  -She is s/p neoadjuvantchemoof 6 cyclesTCHP, right breast mastectomyandaxillary lymph node dissectionon 09/14/18 and adjuvant radiation.  -I discussedthat complete pathological response predict good outcome, her risk of recurrence is small. -She is currently on maintenance Herceptinand Perjeta(due to node positive disease at diagnosis)to complete 1 year of treatment in 03/2019. She is tolerating well with mild diarrhea. -She plans to contact Dr. Iran Planas to schedule her reconstruction surgery.  -To reduce her risk of distant breast cancer, I started her on anti-estrogen therapy with Tamoxifen in 12/2018. She experienced heart palpitations, taste change and worsened neuropathy and hot flashes. She stopped on 01/03/19.  -Her last period was 02/2018. I discussed for pre-menopausal women Tamoxifen is the only option. I discussed testing her hormonal level and if still pre-menopausal she can consider ovarian suppressant injections or undergo oophorectomy, so she can start aromatase inhibitor. Another option is to manage her symptoms with increased dose of her Effexor and let her re-try Tamoxifen. She will think  about it  -Will continue to hold anti-estrogen therapy for now and test her hormonal level in the next month to determine course of action.  -Labs reviewed, CBC and CMP WNL except K 3.1, BG 126. Overall adequate to proceed with Herceptin and Perjeta today.  -f/u in 6 weeks   2. Geneticsshowed no pathogenic mutations.   3. Anxiety  -She has history of anxiety, currently on Xanax as needed -Stable, she has been stressed with her family and unemployment lately.  -On Effexor 37.'5mg'$  daily.   4. HTN -She will continue atenolol. -BP is well controlled, 133/61 today (  01/07/19)  5. Bilateral small lung nodules -Her staging CT scan showed bilateral multiple small lung nodules, 3 to 4 mm, indeterminate, likely benign. She is a never smoker -CT chest from 01/04/28 shows stable tiny b/l nodules, no concerns for malignancy. I reviewed the images and discussed with her    6. Diarrhea, Hypokalemia   -secondary to Perjeta  -I advised her to take 2 imodium every morning and then continue as needed through out day up to 8 tabs.  -Loose stool lately.  -Her K at 3.1 today (01/07/19). I will call in power potassium to take 2 a day for 5 days then once daily.   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    Plan: -Continue to hold Tamoxifen  -Labs reviewed, Will proceed with Herceptin/Perjetatoday, and continue every 3 weeks -CT chest scan reviewed  -will check estradiol and FSH in 3 weeks  -Lab, flush, Herceptin and Perjeta in 3, 6 weeks  -F/u in 6 weeks, and finalize her antiestrogen therapy     No problem-specific Assessment & Plan notes found for this encounter.   No orders of the defined types were placed in this encounter.  All questions were answered. The patient knows to call the clinic with any problems, questions or concerns. No barriers to learning was detected. I spent 20 minutes counseling the patient face to face. The total time spent in the  appointment was 25 minutes and more than 50% was on counseling and review of test results     Truitt Merle, MD 01/07/2019   I, Joslyn Devon, am acting as scribe for Truitt Merle, MD.   I have reviewed the above documentation for accuracy and completeness, and I agree with the above.

## 2019-01-05 NOTE — Telephone Encounter (Signed)
TCT patient. No answer but able to leave vm message for pt to call back. Ok for pt to stop tamoxifen. Dr. Burr Medico to discuss with her at her appt on Friday, 01/07/19

## 2019-01-07 ENCOUNTER — Encounter: Payer: Self-pay | Admitting: *Deleted

## 2019-01-07 ENCOUNTER — Encounter: Payer: Self-pay | Admitting: Hematology

## 2019-01-07 ENCOUNTER — Inpatient Hospital Stay: Payer: BC Managed Care – PPO

## 2019-01-07 ENCOUNTER — Inpatient Hospital Stay (HOSPITAL_BASED_OUTPATIENT_CLINIC_OR_DEPARTMENT_OTHER): Payer: BC Managed Care – PPO | Admitting: Hematology

## 2019-01-07 ENCOUNTER — Other Ambulatory Visit: Payer: Self-pay

## 2019-01-07 ENCOUNTER — Inpatient Hospital Stay: Payer: BC Managed Care – PPO | Attending: Hematology

## 2019-01-07 VITALS — BP 141/78 | HR 72 | Temp 98.2°F | Resp 18

## 2019-01-07 DIAGNOSIS — K521 Toxic gastroenteritis and colitis: Secondary | ICD-10-CM | POA: Diagnosis not present

## 2019-01-07 DIAGNOSIS — I10 Essential (primary) hypertension: Secondary | ICD-10-CM

## 2019-01-07 DIAGNOSIS — R918 Other nonspecific abnormal finding of lung field: Secondary | ICD-10-CM | POA: Insufficient documentation

## 2019-01-07 DIAGNOSIS — Z170 Estrogen receptor positive status [ER+]: Secondary | ICD-10-CM | POA: Diagnosis not present

## 2019-01-07 DIAGNOSIS — C773 Secondary and unspecified malignant neoplasm of axilla and upper limb lymph nodes: Secondary | ICD-10-CM | POA: Insufficient documentation

## 2019-01-07 DIAGNOSIS — C50211 Malignant neoplasm of upper-inner quadrant of right female breast: Secondary | ICD-10-CM | POA: Insufficient documentation

## 2019-01-07 DIAGNOSIS — F419 Anxiety disorder, unspecified: Secondary | ICD-10-CM | POA: Diagnosis not present

## 2019-01-07 DIAGNOSIS — G620 Drug-induced polyneuropathy: Secondary | ICD-10-CM

## 2019-01-07 DIAGNOSIS — Z95828 Presence of other vascular implants and grafts: Secondary | ICD-10-CM

## 2019-01-07 DIAGNOSIS — Z9221 Personal history of antineoplastic chemotherapy: Secondary | ICD-10-CM | POA: Diagnosis not present

## 2019-01-07 DIAGNOSIS — Z9011 Acquired absence of right breast and nipple: Secondary | ICD-10-CM | POA: Insufficient documentation

## 2019-01-07 DIAGNOSIS — T451X5A Adverse effect of antineoplastic and immunosuppressive drugs, initial encounter: Secondary | ICD-10-CM | POA: Insufficient documentation

## 2019-01-07 DIAGNOSIS — E876 Hypokalemia: Secondary | ICD-10-CM

## 2019-01-07 DIAGNOSIS — Z923 Personal history of irradiation: Secondary | ICD-10-CM

## 2019-01-07 DIAGNOSIS — Z5112 Encounter for antineoplastic immunotherapy: Secondary | ICD-10-CM | POA: Diagnosis not present

## 2019-01-07 DIAGNOSIS — Z79899 Other long term (current) drug therapy: Secondary | ICD-10-CM | POA: Diagnosis not present

## 2019-01-07 LAB — CBC WITH DIFFERENTIAL (CANCER CENTER ONLY)
Abs Immature Granulocytes: 0.01 10*3/uL (ref 0.00–0.07)
Basophils Absolute: 0 10*3/uL (ref 0.0–0.1)
Basophils Relative: 1 %
Eosinophils Absolute: 0.1 10*3/uL (ref 0.0–0.5)
Eosinophils Relative: 3 %
HCT: 34.9 % — ABNORMAL LOW (ref 36.0–46.0)
Hemoglobin: 12.1 g/dL (ref 12.0–15.0)
Immature Granulocytes: 0 %
Lymphocytes Relative: 19 %
Lymphs Abs: 0.8 10*3/uL (ref 0.7–4.0)
MCH: 32 pg (ref 26.0–34.0)
MCHC: 34.7 g/dL (ref 30.0–36.0)
MCV: 92.3 fL (ref 80.0–100.0)
Monocytes Absolute: 0.3 10*3/uL (ref 0.1–1.0)
Monocytes Relative: 7 %
Neutro Abs: 2.9 10*3/uL (ref 1.7–7.7)
Neutrophils Relative %: 70 %
Platelet Count: 158 10*3/uL (ref 150–400)
RBC: 3.78 MIL/uL — ABNORMAL LOW (ref 3.87–5.11)
RDW: 12.1 % (ref 11.5–15.5)
WBC Count: 4 10*3/uL (ref 4.0–10.5)
nRBC: 0 % (ref 0.0–0.2)

## 2019-01-07 LAB — CMP (CANCER CENTER ONLY)
ALT: 12 U/L (ref 0–44)
AST: 17 U/L (ref 15–41)
Albumin: 4.2 g/dL (ref 3.5–5.0)
Alkaline Phosphatase: 126 U/L (ref 38–126)
Anion gap: 13 (ref 5–15)
BUN: 9 mg/dL (ref 6–20)
CO2: 23 mmol/L (ref 22–32)
Calcium: 9.1 mg/dL (ref 8.9–10.3)
Chloride: 108 mmol/L (ref 98–111)
Creatinine: 0.72 mg/dL (ref 0.44–1.00)
GFR, Est AFR Am: >60 mL/min (ref >60)
GFR, Estimated: >60 mL/min (ref >60)
Glucose, Bld: 126 mg/dL — ABNORMAL HIGH (ref 70–99)
Potassium: 3.1 mmol/L — ABNORMAL LOW (ref 3.5–5.1)
Sodium: 144 mmol/L (ref 135–145)
Total Bilirubin: 0.6 mg/dL (ref 0.3–1.2)
Total Protein: 7.5 g/dL (ref 6.5–8.1)

## 2019-01-07 MED ORDER — SODIUM CHLORIDE 0.9% FLUSH
10.0000 mL | INTRAVENOUS | Status: DC | PRN
  Administered 2019-01-07: 10 mL
  Filled 2019-01-07: qty 10

## 2019-01-07 MED ORDER — HEPARIN SOD (PORK) LOCK FLUSH 100 UNIT/ML IV SOLN
500.0000 [IU] | Freq: Once | INTRAVENOUS | Status: AC | PRN
  Administered 2019-01-07: 500 [IU]
  Filled 2019-01-07: qty 5

## 2019-01-07 MED ORDER — SODIUM CHLORIDE 0.9 % IV SOLN
Freq: Once | INTRAVENOUS | Status: AC
  Administered 2019-01-07: 12:00:00 via INTRAVENOUS
  Filled 2019-01-07: qty 250

## 2019-01-07 MED ORDER — POTASSIUM CHLORIDE 20 MEQ PO PACK: 20.0000 meq | PACK | Freq: Two times a day (BID) | ORAL | 1 refills | Status: DC

## 2019-01-07 MED ORDER — DIPHENHYDRAMINE HCL 25 MG PO CAPS
50.0000 mg | ORAL_CAPSULE | Freq: Once | ORAL | Status: AC
  Administered 2019-01-07: 50 mg via ORAL

## 2019-01-07 MED ORDER — TRASTUZUMAB CHEMO 150 MG IV SOLR
450.0000 mg | Freq: Once | INTRAVENOUS | Status: AC
  Administered 2019-01-07: 450 mg via INTRAVENOUS
  Filled 2019-01-07: qty 21.43

## 2019-01-07 MED ORDER — ACETAMINOPHEN 325 MG PO TABS
650.0000 mg | ORAL_TABLET | Freq: Once | ORAL | Status: AC
  Administered 2019-01-07: 650 mg via ORAL

## 2019-01-07 MED ORDER — ACETAMINOPHEN 325 MG PO TABS
ORAL_TABLET | ORAL | Status: AC
  Filled 2019-01-07: qty 2

## 2019-01-07 MED ORDER — SODIUM CHLORIDE 0.9 % IV SOLN
420.0000 mg | Freq: Once | INTRAVENOUS | Status: AC
  Administered 2019-01-07: 420 mg via INTRAVENOUS
  Filled 2019-01-07: qty 14

## 2019-01-07 MED ORDER — DIPHENHYDRAMINE HCL 25 MG PO CAPS
ORAL_CAPSULE | ORAL | Status: AC
  Filled 2019-01-07: qty 2

## 2019-01-07 NOTE — Patient Instructions (Signed)
[facility] Discharge Instructions for Patients Receiving Chemotherapy  Today you received the following chemotherapy agents Trastuzumab (HERCEPTIN) & Pertuzumab (PERJETA).  To help prevent nausea and vomiting after your treatment, we encourage you to take your nausea medication as prescribed.   If you develop nausea and vomiting that is not controlled by your nausea medication, call the clinic.   BELOW ARE SYMPTOMS THAT SHOULD BE REPORTED IMMEDIATELY:  *FEVER GREATER THAN 100.5 F  *CHILLS WITH OR WITHOUT FEVER  NAUSEA AND VOMITING THAT IS NOT CONTROLLED WITH YOUR NAUSEA MEDICATION  *UNUSUAL SHORTNESS OF BREATH  *UNUSUAL BRUISING OR BLEEDING  TENDERNESS IN MOUTH AND THROAT WITH OR WITHOUT PRESENCE OF ULCERS  *URINARY PROBLEMS  *BOWEL PROBLEMS  UNUSUAL RASH Items with * indicate a potential emergency and should be followed up as soon as possible.  Feel free to call the clinic should you have any questions or concerns. The clinic phone number is [phone].  Please show the [place] [facility] at check-in to the Emergency Department and triage nurse.  Coronavirus (COVID-19) Are you at risk?  Are you at risk for the Coronavirus (COVID-19)?  To be considered HIGH RISK for Coronavirus (COVID-19), you have to meet the following criteria:  . Traveled to [place], [place], [place], [place] or [place]; or in the [place] to [place], [place], [place], or [place]; and have fever, cough, and shortness of breath within the last 2 weeks of travel OR . Been in close contact with a person diagnosed with COVID-19 within the last 2 weeks and have fever, cough, and shortness of breath . IF YOU DO NOT MEET THESE CRITERIA, YOU ARE CONSIDERED LOW RISK FOR COVID-19.  What to do if you are HIGH RISK for COVID-19?  Marland Kitchen If you are having a medical emergency, call 911. . Seek medical care right away. Before you go to a doctor's office, urgent care or emergency department,  call ahead and tell them about your recent travel, contact with someone diagnosed with COVID-19, and your symptoms. You should receive instructions from your physician's office regarding next steps of care.  . When you arrive at healthcare provider, tell the healthcare staff immediately you have returned from visiting [place], [place], [place], [place] or [place]; or traveled in the [place] to [place], [place], [place], or [place]; in the last two weeks or you have been in close contact with a person diagnosed with COVID-19 in the last 2 weeks.   . Tell the health care staff about your symptoms: fever, cough and shortness of breath. . After you have been seen by a medical provider, you will be either: o Tested for (COVID-19) and discharged home on quarantine except to seek medical care if symptoms worsen, and asked to  - Stay home and avoid contact with others until you get your results (4-5 days)  - Avoid travel on public transportation if possible (such as bus, train, or airplane) or o Sent to the Emergency Department by EMS for evaluation, COVID-19 testing, and possible admission depending on your condition and test results.  What to do if you are LOW RISK for COVID-19?  Reduce your risk of any infection by using the same precautions used for avoiding the common cold or flu:  Marland Kitchen Wash your hands often with soap and warm water for at least 20 seconds.  If soap and water are not readily available, use an alcohol-based hand sanitizer with at least 60% alcohol.  Marland Kitchen  If coughing or sneezing, cover your mouth and nose by coughing or sneezing into the elbow areas of your shirt or coat, into a tissue or into your sleeve (not your hands). . Avoid shaking hands with others and consider head nods or verbal greetings only. . Avoid touching your eyes, nose, or mouth with unwashed hands.  . Avoid close contact with people who are sick. . Avoid places or events with large numbers of people in one  location, like concerts or sporting events. . Carefully consider travel plans you have or are making. . If you are planning any travel outside or inside the [place], visit the CDC's Travelers' Health webpage for the latest health notices. . If you have some symptoms but not all symptoms, continue to monitor at home and seek medical attention if your symptoms worsen. . If you are having a medical emergency, call 911.   ADDITIONAL HEALTHCARE OPTIONS FOR PATIENTS  [facility] / e-Visit: [email or web address]         [facility]: [phone]  [facility]: [phone]                   [facility]: [phone]   

## 2019-01-07 NOTE — Progress Notes (Signed)
Pt refused 30 min observation VSS at discharge

## 2019-01-07 NOTE — Patient Instructions (Signed)

## 2019-01-10 ENCOUNTER — Telehealth: Payer: Self-pay | Admitting: Hematology

## 2019-01-10 NOTE — Telephone Encounter (Signed)
Per 6/12 no los °

## 2019-01-12 NOTE — Progress Notes (Signed)
I called the patient today about her upcoming follow-up appointment in radiation oncology.   Given concerns about the COVID-19 pandemic, I offered a phone assessment with the patient to determine if coming to the clinic was necessary. She accepted.  I let the patient know that I had spoken with Dr. Isidore Moos, and she wanted them to know the importance of washing their hands for at least 20 seconds at a time, especially after going out in public, and before they eat.  Limit going out in public whenever possible. Do not touch your face, unless your hands are clean, such as when bathing. Get plenty of rest, eat well, and stay hydrated.   The patient denies any symptomatic concerns.  Specifically, they report good healing of their skin in the radiation fields.  Skin is intact.    I recommended that she continue skin care by applying oil or lotion with vitamin E to the skin in the radiation fields, BID, for 2 more months.  Continue follow-up with medical oncology - follow-up is scheduled on 01/27/19 for Herceptin infusion and she will see Dr. Burr Medico on 02/18/19.  I explained that yearly mammograms are important for patients with intact breast tissue, and physical exams are important after mastectomy for patients that cannot undergo mammography.  I encouraged her to call if she had further questions or concerns about her healing. Otherwise, she will follow-up PRN in radiation oncology. Patient is pleased with this plan, and we will cancel her upcoming follow-up to reduce the risk of COVID-19 transmission.

## 2019-01-14 ENCOUNTER — Ambulatory Visit
Admission: RE | Admit: 2019-01-14 | Discharge: 2019-01-14 | Disposition: A | Discharge status: Home / Self Care | Payer: BC Managed Care – PPO | Source: Ambulatory Visit | Attending: Radiation Oncology | Admitting: Radiation Oncology

## 2019-01-17 ENCOUNTER — Other Ambulatory Visit: Payer: Self-pay | Admitting: Hematology

## 2019-01-17 ENCOUNTER — Other Ambulatory Visit: Payer: Self-pay | Admitting: Nurse Practitioner

## 2019-01-17 DIAGNOSIS — C50211 Malignant neoplasm of upper-inner quadrant of right female breast: Secondary | ICD-10-CM

## 2019-01-17 DIAGNOSIS — Z170 Estrogen receptor positive status [ER+]: Secondary | ICD-10-CM

## 2019-01-17 DIAGNOSIS — F419 Anxiety disorder, unspecified: Secondary | ICD-10-CM

## 2019-01-19 DIAGNOSIS — Z9011 Acquired absence of right breast and nipple: Secondary | ICD-10-CM | POA: Diagnosis not present

## 2019-01-19 DIAGNOSIS — C50211 Malignant neoplasm of upper-inner quadrant of right female breast: Secondary | ICD-10-CM | POA: Diagnosis not present

## 2019-01-19 DIAGNOSIS — Z170 Estrogen receptor positive status [ER+]: Secondary | ICD-10-CM | POA: Diagnosis not present

## 2019-01-19 DIAGNOSIS — Z923 Personal history of irradiation: Secondary | ICD-10-CM | POA: Diagnosis not present

## 2019-01-24 ENCOUNTER — Other Ambulatory Visit: Payer: Self-pay | Admitting: Nurse Practitioner

## 2019-01-24 ENCOUNTER — Other Ambulatory Visit: Payer: Self-pay

## 2019-01-24 ENCOUNTER — Telehealth: Payer: Self-pay

## 2019-01-24 DIAGNOSIS — Z170 Estrogen receptor positive status [ER+]: Secondary | ICD-10-CM

## 2019-01-24 DIAGNOSIS — F419 Anxiety disorder, unspecified: Secondary | ICD-10-CM

## 2019-01-24 DIAGNOSIS — C50211 Malignant neoplasm of upper-inner quadrant of right female breast: Secondary | ICD-10-CM

## 2019-01-24 MED ORDER — ALPRAZOLAM 0.5 MG PO TABS: ORAL_TABLET | ORAL | 0 refills | Status: DC

## 2019-01-24 MED ORDER — POTASSIUM CHLORIDE CRYS ER 20 MEQ PO TBCR: EXTENDED_RELEASE_TABLET | ORAL | 3 refills | Status: DC

## 2019-01-24 NOTE — Telephone Encounter (Signed)
Patient calls for refill on Xanax to be sent into [facility] on file.  [phone]

## 2019-01-27 ENCOUNTER — Other Ambulatory Visit: Payer: Self-pay

## 2019-01-27 ENCOUNTER — Inpatient Hospital Stay: Payer: BC Managed Care – PPO

## 2019-01-27 ENCOUNTER — Inpatient Hospital Stay: Payer: BC Managed Care – PPO | Attending: Hematology

## 2019-01-27 VITALS — BP 140/72 | HR 80 | Temp 98.7°F | Resp 16

## 2019-01-27 DIAGNOSIS — C773 Secondary and unspecified malignant neoplasm of axilla and upper limb lymph nodes: Secondary | ICD-10-CM | POA: Insufficient documentation

## 2019-01-27 DIAGNOSIS — C50211 Malignant neoplasm of upper-inner quadrant of right female breast: Secondary | ICD-10-CM

## 2019-01-27 DIAGNOSIS — Z5112 Encounter for antineoplastic immunotherapy: Secondary | ICD-10-CM | POA: Insufficient documentation

## 2019-01-27 DIAGNOSIS — Z170 Estrogen receptor positive status [ER+]: Secondary | ICD-10-CM

## 2019-01-27 DIAGNOSIS — Z95828 Presence of other vascular implants and grafts: Secondary | ICD-10-CM

## 2019-01-27 LAB — CMP (CANCER CENTER ONLY)
ALT: 13 U/L (ref 0–44)
AST: 18 U/L (ref 15–41)
Albumin: 4 g/dL (ref 3.5–5.0)
Alkaline Phosphatase: 133 U/L — ABNORMAL HIGH (ref 38–126)
Anion gap: 10 (ref 5–15)
BUN: 9 mg/dL (ref 6–20)
CO2: 25 mmol/L (ref 22–32)
Calcium: 8.8 mg/dL — ABNORMAL LOW (ref 8.9–10.3)
Chloride: 108 mmol/L (ref 98–111)
Creatinine: 0.7 mg/dL (ref 0.44–1.00)
GFR, Est AFR Am: >60 mL/min (ref >60)
GFR, Estimated: >60 mL/min (ref >60)
Glucose, Bld: 108 mg/dL — ABNORMAL HIGH (ref 70–99)
Potassium: 3.4 mmol/L — ABNORMAL LOW (ref 3.5–5.1)
Sodium: 143 mmol/L (ref 135–145)
Total Bilirubin: 0.3 mg/dL (ref 0.3–1.2)
Total Protein: 7.3 g/dL (ref 6.5–8.1)

## 2019-01-27 LAB — CBC WITH DIFFERENTIAL (CANCER CENTER ONLY)
Abs Immature Granulocytes: 0.01 10*3/uL (ref 0.00–0.07)
Basophils Absolute: 0 10*3/uL (ref 0.0–0.1)
Basophils Relative: 0 %
Eosinophils Absolute: 0.1 10*3/uL (ref 0.0–0.5)
Eosinophils Relative: 3 %
HCT: 32.7 % — ABNORMAL LOW (ref 36.0–46.0)
Hemoglobin: 11.8 g/dL — ABNORMAL LOW (ref 12.0–15.0)
Immature Granulocytes: 0 %
Lymphocytes Relative: 22 %
Lymphs Abs: 0.9 10*3/uL (ref 0.7–4.0)
MCH: 33.1 pg (ref 26.0–34.0)
MCHC: 36.1 g/dL — ABNORMAL HIGH (ref 30.0–36.0)
MCV: 91.6 fL (ref 80.0–100.0)
Monocytes Absolute: 0.3 10*3/uL (ref 0.1–1.0)
Monocytes Relative: 8 %
Neutro Abs: 2.8 10*3/uL (ref 1.7–7.7)
Neutrophils Relative %: 67 %
Platelet Count: 174 10*3/uL (ref 150–400)
RBC: 3.57 MIL/uL — ABNORMAL LOW (ref 3.87–5.11)
RDW: 12.1 % (ref 11.5–15.5)
WBC Count: 4.2 10*3/uL (ref 4.0–10.5)
nRBC: 0 % (ref 0.0–0.2)

## 2019-01-27 MED ORDER — HEPARIN SOD (PORK) LOCK FLUSH 100 UNIT/ML IV SOLN
500.0000 [IU] | Freq: Once | INTRAVENOUS | Status: AC | PRN
  Administered 2019-01-27: 500 [IU]
  Filled 2019-01-27: qty 5

## 2019-01-27 MED ORDER — ACETAMINOPHEN 325 MG PO TABS
650.0000 mg | ORAL_TABLET | Freq: Once | ORAL | Status: AC
  Administered 2019-01-27: 650 mg via ORAL

## 2019-01-27 MED ORDER — DIPHENHYDRAMINE HCL 25 MG PO CAPS
ORAL_CAPSULE | ORAL | Status: AC
  Filled 2019-01-27: qty 1

## 2019-01-27 MED ORDER — SODIUM CHLORIDE 0.9% FLUSH
10.0000 mL | INTRAVENOUS | Status: DC | PRN
  Administered 2019-01-27: 10 mL
  Filled 2019-01-27: qty 10

## 2019-01-27 MED ORDER — DIPHENHYDRAMINE HCL 25 MG PO CAPS
50.0000 mg | ORAL_CAPSULE | Freq: Once | ORAL | Status: AC
  Administered 2019-01-27: 25 mg via ORAL

## 2019-01-27 MED ORDER — TRASTUZUMAB CHEMO 150 MG IV SOLR
450.0000 mg | Freq: Once | INTRAVENOUS | Status: AC
  Administered 2019-01-27: 450 mg via INTRAVENOUS
  Filled 2019-01-27: qty 21.43

## 2019-01-27 MED ORDER — ACETAMINOPHEN 325 MG PO TABS
ORAL_TABLET | ORAL | Status: AC
  Filled 2019-01-27: qty 2

## 2019-01-27 MED ORDER — SODIUM CHLORIDE 0.9 % IV SOLN
420.0000 mg | Freq: Once | INTRAVENOUS | Status: AC
  Administered 2019-01-27: 420 mg via INTRAVENOUS
  Filled 2019-01-27: qty 14

## 2019-01-27 MED ORDER — SODIUM CHLORIDE 0.9 % IV SOLN
Freq: Once | INTRAVENOUS | Status: AC
  Administered 2019-01-27: 13:00:00 via INTRAVENOUS
  Filled 2019-01-27: qty 250

## 2019-01-27 NOTE — Patient Instructions (Signed)
[facility] Discharge Instructions for Patients Receiving Chemotherapy  Today you received the following chemotherapy agents Trastuzumab (HERCEPTIN) & Pertuzumab (PERJETA).  To help prevent nausea and vomiting after your treatment, we encourage you to take your nausea medication as prescribed.   If you develop nausea and vomiting that is not controlled by your nausea medication, call the clinic.   BELOW ARE SYMPTOMS THAT SHOULD BE REPORTED IMMEDIATELY:  *FEVER GREATER THAN 100.5 F  *CHILLS WITH OR WITHOUT FEVER  NAUSEA AND VOMITING THAT IS NOT CONTROLLED WITH YOUR NAUSEA MEDICATION  *UNUSUAL SHORTNESS OF BREATH  *UNUSUAL BRUISING OR BLEEDING  TENDERNESS IN MOUTH AND THROAT WITH OR WITHOUT PRESENCE OF ULCERS  *URINARY PROBLEMS  *BOWEL PROBLEMS  UNUSUAL RASH Items with * indicate a potential emergency and should be followed up as soon as possible.  Feel free to call the clinic should you have any questions or concerns. The clinic phone number is [phone].  Please show the [facility] at check-in to the Emergency Department and triage nurse.  Coronavirus (COVID-19) Are you at risk?  Are you at risk for the Coronavirus (COVID-19)?  To be considered HIGH RISK for Coronavirus (COVID-19), you have to meet the following criteria:  . Traveled to [place], [place], [place], [place] or [place]; or in the [place] to [place], [place], [place], or [place]; and have fever, cough, and shortness of breath within the last 2 weeks of travel OR . Been in close contact with a person diagnosed with COVID-19 within the last 2 weeks and have fever, cough, and shortness of breath . IF YOU DO NOT MEET THESE CRITERIA, YOU ARE CONSIDERED LOW RISK FOR COVID-19.  What to do if you are HIGH RISK for COVID-19?  Marland Kitchen If you are having a medical emergency, call 911. . Seek medical care right away. Before you go to a doctor's office, urgent care or emergency department,  call ahead and tell them about your recent travel, contact with someone diagnosed with COVID-19, and your symptoms. You should receive instructions from your physician's office regarding next steps of care.  . When you arrive at healthcare provider, tell the healthcare staff immediately you have returned from visiting [place], [place], [place], [place] or [place]; or traveled in the [place] to [place], [place], [place], or [place]; in the last two weeks or you have been in close contact with a person diagnosed with COVID-19 in the last 2 weeks.   . Tell the health care staff about your symptoms: fever, cough and shortness of breath. . After you have been seen by a medical provider, you will be either: o Tested for (COVID-19) and discharged home on quarantine except to seek medical care if symptoms worsen, and asked to  - Stay home and avoid contact with others until you get your results (4-5 days)  - Avoid travel on public transportation if possible (such as bus, train, or airplane) or o Sent to the Emergency Department by EMS for evaluation, COVID-19 testing, and possible admission depending on your condition and test results.  What to do if you are LOW RISK for COVID-19?  Reduce your risk of any infection by using the same precautions used for avoiding the common cold or flu:  Marland Kitchen Wash your hands often with soap and warm water for at least 20 seconds.  If soap and water are not readily available, use an alcohol-based hand sanitizer with at least 60% alcohol.  Marland Kitchen  If coughing or sneezing, cover your mouth and nose by coughing or sneezing into the elbow areas of your shirt or coat, into a tissue or into your sleeve (not your hands). . Avoid shaking hands with others and consider head nods or verbal greetings only. . Avoid touching your eyes, nose, or mouth with unwashed hands.  . Avoid close contact with people who are sick. . Avoid places or events with large numbers of people in one  location, like concerts or sporting events. . Carefully consider travel plans you have or are making. . If you are planning any travel outside or inside the [place], visit the CDC's Travelers' Health webpage for the latest health notices. . If you have some symptoms but not all symptoms, continue to monitor at home and seek medical attention if your symptoms worsen. . If you are having a medical emergency, call 911.   ADDITIONAL HEALTHCARE OPTIONS FOR PATIENTS  [facility] / e-Visit: [email or web address]         [facility]: [phone]  [facility]: [phone]                   [facility]: [phone]   

## 2019-01-28 LAB — FOLLICLE STIMULATING HORMONE: FSH: 89.4 m[IU]/mL

## 2019-01-28 LAB — ESTRADIOL: Estradiol: <5 pg/mL

## 2019-01-30 LAB — CANCER ANTIGEN 27.29: CA 27.29: 25.2 U/mL (ref 0.0–38.6)

## 2019-02-01 DIAGNOSIS — I10 Essential (primary) hypertension: Secondary | ICD-10-CM | POA: Diagnosis not present

## 2019-02-01 DIAGNOSIS — Z8249 Family history of ischemic heart disease and other diseases of the circulatory system: Secondary | ICD-10-CM | POA: Diagnosis not present

## 2019-02-01 DIAGNOSIS — C50919 Malignant neoplasm of unspecified site of unspecified female breast: Secondary | ICD-10-CM | POA: Diagnosis not present

## 2019-02-01 DIAGNOSIS — Z131 Encounter for screening for diabetes mellitus: Secondary | ICD-10-CM | POA: Diagnosis not present

## 2019-02-01 DIAGNOSIS — Z0000 Encounter for general adult medical examination without abnormal findings: Secondary | ICD-10-CM | POA: Diagnosis not present

## 2019-02-02 ENCOUNTER — Telehealth: Payer: Self-pay

## 2019-02-02 ENCOUNTER — Other Ambulatory Visit: Payer: Self-pay | Admitting: Hematology

## 2019-02-02 MED ORDER — ANASTROZOLE 1 MG PO TABS: 1.0000 mg | ORAL_TABLET | Freq: Every day | ORAL | 2 refills | Status: DC

## 2019-02-02 NOTE — Telephone Encounter (Signed)
Spoke with patient regarding lab results, per Dr. Burr Medico they show she is postmenopausal, also potassium is a little low, encouraged her to eat foods rich in potassium, also calcium low, instructed to take OTC calcium supplement daily.    Dr. Burr Medico has called in Anastrozole 1 mg tablet take one daily. She verbalized an understanding.

## 2019-02-08 ENCOUNTER — Other Ambulatory Visit: Payer: Self-pay | Admitting: Hematology

## 2019-02-08 DIAGNOSIS — Z170 Estrogen receptor positive status [ER+]: Secondary | ICD-10-CM

## 2019-02-08 DIAGNOSIS — C50211 Malignant neoplasm of upper-inner quadrant of right female breast: Secondary | ICD-10-CM

## 2019-02-08 MED ORDER — TRAMADOL HCL 50 MG PO TABS: ORAL_TABLET | ORAL | 0 refills | Status: DC

## 2019-02-10 ENCOUNTER — Telehealth: Payer: Self-pay

## 2019-02-10 NOTE — Telephone Encounter (Signed)
Patient calls stating she has been having a lot of these symptoms for some time.  Complaint of body aches in joints/ shoulders down in to her arms.  When she yawns if she is looking down she experiences what feels like a spasm in throat.  Also most recently having spasms in lower back.   She started Anastrozole on 7/10 but all of these things were occurring prior to start of this medication.  She is seeking advise on what could be causing these symptoms and/or what she should do.  [phone]

## 2019-02-11 ENCOUNTER — Telehealth: Payer: Self-pay | Admitting: Hematology

## 2019-02-11 NOTE — Telephone Encounter (Signed)
I returned patient's call, regarding her body aches and muscle spasm.  This has happened in the past months, before anastrozole.  Her calcium and potassium has been low, I recommend her taking supplements, including potassium, calcium, and magnesium, and drinks fluids adequately, for muscle spasm.  She also has Flexeril and tramadol which she uses as needed.  I recommend exercise, she can contact her physical therapist to guild her also. She is scheduled to see me next week.  Truitt Merle  02/11/2019

## 2019-02-14 ENCOUNTER — Telehealth: Payer: Self-pay

## 2019-02-14 ENCOUNTER — Telehealth: Payer: Self-pay | Admitting: Hematology

## 2019-02-14 NOTE — Telephone Encounter (Signed)
Patient calls stating she is having flu like symptoms, temperature running between 99.4 to 99.7, does not feel well.  Wants advice on what to do?  [phone]

## 2019-02-14 NOTE — Telephone Encounter (Signed)
Patient call with feeling poorly, flu like symptom, T99.4 to 99.7, continues to have joint pain and back spasms.    [phone]

## 2019-02-14 NOTE — Telephone Encounter (Signed)
I called pt back. She has been having body aches, and low grade fever since 2-3 days ago, no sore throat, cough or dyspnea, appetite low, but able to eat and drink adequately. I recommend her to stop anastrozole for now, we discussed her symptoms are likely related to viral infection, but not typical for COVID-19. She wants to be tested for COVID-19, which is reasonable, will arrange the test tomorrow. I encourage her to drink more fluids, and calls [place] in 2-3 days, to decide if we need to postpone her next treatment on 7/24. She agrees with the plan.  Truitt Merle  02/14/2019

## 2019-02-15 ENCOUNTER — Telehealth: Payer: Self-pay | Admitting: *Deleted

## 2019-02-15 ENCOUNTER — Other Ambulatory Visit: Payer: Self-pay | Admitting: Nurse Practitioner

## 2019-02-15 DIAGNOSIS — R509 Fever, unspecified: Secondary | ICD-10-CM

## 2019-02-15 NOTE — Telephone Encounter (Signed)
Received call from pt asking about covid testing.  She would like an appt to receive & hasn't heard from anyone.  Order has been placed.  Called [phone] & was on hold >20 minutes.  Used [employer] & sent pool message to schedule pt.  Informed pt that someone should call her for testing appt. & to call us if she doesn't receive call.

## 2019-02-16 ENCOUNTER — Telehealth: Payer: Self-pay

## 2019-02-16 NOTE — Telephone Encounter (Signed)
Faxed order for rapid Covid test to be done at [facility], per patient's request.  Fax [phone]

## 2019-02-17 ENCOUNTER — Telehealth: Payer: Self-pay

## 2019-02-17 ENCOUNTER — Telehealth: Payer: Self-pay | Admitting: Hematology

## 2019-02-17 DIAGNOSIS — Z20828 Contact with and (suspected) exposure to other viral communicable diseases: Secondary | ICD-10-CM | POA: Diagnosis not present

## 2019-02-17 NOTE — Telephone Encounter (Signed)
Scheduled appt per 7/23 sch message - pt is aware of new appt date and time

## 2019-02-17 NOTE — Telephone Encounter (Signed)
Patient called about upcoming appointments for 7/24, she is still having muscle aches and low grade fevers, flu like, had COVID testing at [facility] this morning but was told it would be 5 to 7 days for results to come back.  She wanted Dr. Ernestina Penna advise on what to do.  Per Dr. Burr Medico okay to postpone treatment for one week.  Also would be happy to see the patient in office.  She declined appointment tomorrow and will try to hydrate, take Tylenol and get a Vitamin C supplement to take.   Scheduling message was sent to reschedule her appointments for 7/24 to 7/31.

## 2019-02-18 ENCOUNTER — Telehealth: Payer: Self-pay

## 2019-02-18 ENCOUNTER — Inpatient Hospital Stay: Payer: BC Managed Care – PPO | Admitting: Hematology

## 2019-02-18 ENCOUNTER — Inpatient Hospital Stay: Payer: BC Managed Care – PPO

## 2019-02-18 ENCOUNTER — Emergency Department (HOSPITAL_COMMUNITY)
Admission: EM | Admit: 2019-02-18 | Discharge: 2019-02-18 | Disposition: A | Discharge status: Home / Self Care | Payer: BC Managed Care – PPO | Attending: Emergency Medicine | Admitting: Emergency Medicine

## 2019-02-18 ENCOUNTER — Other Ambulatory Visit: Payer: Self-pay

## 2019-02-18 ENCOUNTER — Emergency Department (HOSPITAL_COMMUNITY): Payer: BC Managed Care – PPO

## 2019-02-18 ENCOUNTER — Encounter (HOSPITAL_COMMUNITY): Payer: Self-pay | Admitting: *Deleted

## 2019-02-18 DIAGNOSIS — R531 Weakness: Secondary | ICD-10-CM | POA: Diagnosis not present

## 2019-02-18 DIAGNOSIS — R509 Fever, unspecified: Secondary | ICD-10-CM | POA: Diagnosis not present

## 2019-02-18 DIAGNOSIS — R252 Cramp and spasm: Secondary | ICD-10-CM | POA: Diagnosis not present

## 2019-02-18 DIAGNOSIS — I10 Essential (primary) hypertension: Secondary | ICD-10-CM | POA: Diagnosis not present

## 2019-02-18 DIAGNOSIS — M791 Myalgia, unspecified site: Secondary | ICD-10-CM | POA: Diagnosis not present

## 2019-02-18 DIAGNOSIS — Z853 Personal history of malignant neoplasm of breast: Secondary | ICD-10-CM | POA: Insufficient documentation

## 2019-02-18 DIAGNOSIS — Z79899 Other long term (current) drug therapy: Secondary | ICD-10-CM | POA: Insufficient documentation

## 2019-02-18 DIAGNOSIS — Z20828 Contact with and (suspected) exposure to other viral communicable diseases: Secondary | ICD-10-CM | POA: Diagnosis not present

## 2019-02-18 LAB — CBC WITH DIFFERENTIAL/PLATELET
Abs Immature Granulocytes: 0.02 10*3/uL (ref 0.00–0.07)
Basophils Absolute: 0.1 10*3/uL (ref 0.0–0.1)
Basophils Relative: 1 %
Eosinophils Absolute: 0 10*3/uL (ref 0.0–0.5)
Eosinophils Relative: 1 %
HCT: 28.6 % — ABNORMAL LOW (ref 36.0–46.0)
Hemoglobin: 10.1 g/dL — ABNORMAL LOW (ref 12.0–15.0)
Immature Granulocytes: 0 %
Lymphocytes Relative: 65 %
Lymphs Abs: 3.8 10*3/uL (ref 0.7–4.0)
MCH: 32.9 pg (ref 26.0–34.0)
MCHC: 35.3 g/dL (ref 30.0–36.0)
MCV: 93.2 fL (ref 80.0–100.0)
Monocytes Absolute: 0.3 10*3/uL (ref 0.1–1.0)
Monocytes Relative: 5 %
Neutro Abs: 1.7 10*3/uL (ref 1.7–7.7)
Neutrophils Relative %: 28 %
Platelets: 118 10*3/uL — ABNORMAL LOW (ref 150–400)
RBC: 3.07 MIL/uL — ABNORMAL LOW (ref 3.87–5.11)
RDW: 13.6 % (ref 11.5–15.5)
WBC: 5.9 10*3/uL (ref 4.0–10.5)
nRBC: 0 % (ref 0.0–0.2)

## 2019-02-18 LAB — COMPREHENSIVE METABOLIC PANEL
ALT: 47 U/L — ABNORMAL HIGH (ref 0–44)
AST: 62 U/L — ABNORMAL HIGH (ref 15–41)
Albumin: 3.6 g/dL (ref 3.5–5.0)
Alkaline Phosphatase: 128 U/L — ABNORMAL HIGH (ref 38–126)
Anion gap: 10 (ref 5–15)
BUN: 10 mg/dL (ref 6–20)
CO2: 25 mmol/L (ref 22–32)
Calcium: 8.5 mg/dL — ABNORMAL LOW (ref 8.9–10.3)
Chloride: 102 mmol/L (ref 98–111)
Creatinine, Ser: 0.54 mg/dL (ref 0.44–1.00)
GFR calc Af Amer: >60 mL/min (ref >60)
GFR calc non Af Amer: >60 mL/min (ref >60)
Glucose, Bld: 97 mg/dL (ref 70–99)
Potassium: 3.3 mmol/L — ABNORMAL LOW (ref 3.5–5.1)
Sodium: 137 mmol/L (ref 135–145)
Total Bilirubin: 0.8 mg/dL (ref 0.3–1.2)
Total Protein: 6.6 g/dL (ref 6.5–8.1)

## 2019-02-18 LAB — URINALYSIS, ROUTINE W REFLEX MICROSCOPIC
Bilirubin Urine: NEGATIVE
Glucose, UA: NEGATIVE mg/dL
Hgb urine dipstick: NEGATIVE
Ketones, ur: NEGATIVE mg/dL
Leukocytes,Ua: NEGATIVE
Nitrite: NEGATIVE
Protein, ur: NEGATIVE mg/dL
Specific Gravity, Urine: 1.005 (ref 1.005–1.030)
pH: 6 (ref 5.0–8.0)

## 2019-02-18 LAB — SARS CORONAVIRUS 2 BY RT PCR (HOSPITAL ORDER, PERFORMED IN ~~LOC~~ HOSPITAL LAB): SARS Coronavirus 2: NEGATIVE

## 2019-02-18 LAB — CK: Total CK: 22 U/L — ABNORMAL LOW (ref 38–234)

## 2019-02-18 MED ORDER — SODIUM CHLORIDE 0.9 % IV BOLUS
1000.0000 mL | Freq: Once | INTRAVENOUS | Status: AC
  Administered 2019-02-18: 1000 mL via INTRAVENOUS

## 2019-02-18 MED ORDER — POTASSIUM CHLORIDE CRYS ER 20 MEQ PO TBCR
40.0000 meq | EXTENDED_RELEASE_TABLET | Freq: Once | ORAL | Status: AC
  Administered 2019-02-18: 40 meq via ORAL
  Filled 2019-02-18: qty 2

## 2019-02-18 MED ORDER — HEPARIN SOD (PORK) LOCK FLUSH 100 UNIT/ML IV SOLN
500.0000 [IU] | Freq: Once | INTRAVENOUS | Status: AC
  Administered 2019-02-18: 500 [IU]
  Filled 2019-02-18: qty 5

## 2019-02-18 NOTE — ED Notes (Signed)
Pt ambulated to the restroom w/ a steady gate.

## 2019-02-18 NOTE — ED Provider Notes (Signed)
[facility] DEPT Provider Note   CSN: [id number] Arrival date & time: 02/18/19  1527    History   Chief Complaint Chief Complaint  Patient presents with  . chemo pt  . Generalized Body Aches  . Weakness    HPI Claire Guzman is a 53 y.o. female.     HPI  53 year old female presents with body aches, weakness, and fever.  She has been feeling poorly for about a week.  Highest temperature was 100.7.  At times she will get flushed and feel like she has a low-grade fever and that is when the body aching and spasms seems to be worst.  She has been having body aches and some spasms since starting on chemotherapy but is worse over the last 1 week.  No headache, cough, shortness of breath or abdominal pain.  One time she had an abdominal spasm but no significant abdominal pain and no vomiting.  No diarrhea. Feels generally weak. No known COVID-19 contacts.  Past Medical History:  Diagnosis Date  . Abdominal pain, unspecified site   . Anxiety state, unspecified   . Complication of anesthesia    convulsions - when she has appendectomy about 12 years ago at [facility]  . Contact dermatitis and other eczema, due to unspecified cause   . Essential hypertension, benign   . Family history of colon cancer   . Family history of melanoma   . Family history of prostate cancer   . GERD (gastroesophageal reflux disease)   . Hematuria, unspecified   . Hypertension   . Lower back pain   . Mixed hyperlipidemia   . Rosacea   . rt breast ca dx'd 01/2018   R breast cancer  . Unspecified symptom associated with female genital organs     Patient Active Problem List   Diagnosis Date Noted  . Breast cancer, right ([place]) 09/14/2018  . Port-A-Cath in place 05/11/2018  . Genetic testing 04/12/2018  . Family history of prostate cancer   . Family history of colon cancer   . Family history of melanoma   . Malignant neoplasm of upper-inner quadrant of right breast in  female, estrogen receptor positive ([place]) 03/10/2018    Past Surgical History:  Procedure Laterality Date  . BREAST RECONSTRUCTION WITH PLACEMENT OF TISSUE EXPANDER AND ALLODERM Right 09/14/2018   Procedure: BREAST RECONSTRUCTION WITH PLACEMENT OF TISSUE EXPANDER AND ALLODERM;  Surgeon: Irene Limbo, MD;  Location: [facility];  Service: Plastics;  Laterality: Right;  . CESAREAN SECTION    . DIAGNOSTIC LAPAROSCOPY    . EYE SURGERY     lasix  . LAPAROSCOPIC APPENDECTOMY    . MASTECTOMY WITH RADIOACTIVE SEED GUIDED EXCISION AND AXILLARY SENTINEL LYMPH NODE BIOPSY Right 09/14/2018   Procedure: RIGHT NIPPLE SPARING MASTECTOMY WITH TARGETED DEEP RIGHT AXILLARY LYMPH NODE BIOPSY WITH RADIOACTIVE SEED LOCALIZATION AND RIGHT AXILLARY SENTINEL LYMPH NODE BIOPSY, FROZEN SECTION AND POSSIBLE COMPLETION AXILLARY LYMPH NODE DISSECTION INJECT BLUE DYE RIGHT BREAST;  Surgeon: Fanny Skates, MD;  Location: [facility];  Service: General;  Laterality: Right;  . PORTACATH PLACEMENT Left 04/13/2018   Procedure: INSERTION PORT-A-CATH WITH [place];  Surgeon: Fanny Skates, MD;  Location: [facility];  Service: General;  Laterality: Left;  . UTERINE FIBROID SURGERY       OB History   No obstetric history on file.      Home Medications    Prior to Admission medications   Medication Sig Start Date End Date Taking? Authorizing  Provider  ALPRAZolam (XANAX) 0.5 MG tablet TAKE 1 TABLET(0.5 MG) BY MOUTH AT BEDTIME AS NEEDED FOR ANXIETY 01/24/19   Alla Feeling, NP  anastrozole (ARIMIDEX) 1 MG tablet Take 1 tablet (1 mg total) by mouth daily. 02/02/19   Truitt Merle, MD  atenolol (TENORMIN) 50 MG tablet Take 50 mg by mouth at bedtime.    [provider]  cyclobenzaprine (FLEXERIL) 5 MG tablet Take 1 tablet (5 mg total) by mouth 3 (three) times daily as needed for muscle spasms. 09/14/18   Irene Limbo, MD  gabapentin (NEURONTIN) 300 MG capsule Take 1 capsule (300 mg total) by mouth 3 (three) times  daily. 11/29/18   Eppie Gibson, MD  HYDROcodone-acetaminophen (NORCO) 5-325 MG tablet Take 1-2 tablets by mouth every 4 (four) hours as needed for moderate pain or severe pain. 09/14/18   Irene Limbo, MD  omeprazole (PRILOSEC) 20 MG capsule Take 1 capsule (20 mg total) by mouth 2 (two) times daily before a meal. 05/03/18   Truitt Merle, MD  potassium chloride (KLOR-CON) 20 MEQ packet Take 20 mEq by mouth 2 (two) times daily. Twice daily for 7 days then once daily 01/07/19   Truitt Merle, MD  potassium chloride SA (K-DUR) 20 MEQ tablet Take one tab dialy 01/24/19   Truitt Merle, MD  tamoxifen (NOLVADEX) 20 MG tablet Take 1 tablet (20 mg total) by mouth daily. 12/17/18   Truitt Merle, MD  traMADol Veatrice Bourbon) 50 MG tablet Take 1 tablet BID PRN pain 02/08/19   Truitt Merle, MD  venlafaxine [facility]) 37.5 MG tablet Take 1 tablet (37.5 mg total) by mouth 2 (two) times daily. 08/13/18   Truitt Merle, MD  prochlorperazine (COMPAZINE) 10 MG tablet TAKE 1 TABLET BY MOUTH EVERY 6 HOURS AS NEEDED FOR NAUSEA OR VOMITING 08/10/18 08/12/18  Truitt Merle, MD    Family History Family History  Problem Relation Age of Onset  . Heart failure Mother   . Melanoma Mother 6       has had several removed over the past ten years  . Prostate cancer Father 76  . Heart attack Maternal Grandmother   . Heart attack Maternal Grandfather        possible cancer, unknown type  . Stroke Paternal Grandmother   . Colon cancer Paternal Grandfather        dx upper 8s  . Hypertension Other   . Heart Problems Maternal Aunt        d.60  . Heart Problems Maternal Uncle        d.60  . Colon cancer Paternal Aunt        dx 15s  . Colon cancer Paternal Uncle        dx 36s    Social History Social History   Tobacco Use  . Smoking status: Never Smoker  . Smokeless tobacco: Never Used  Substance Use Topics  . Alcohol use: No  . Drug use: Never     Allergies   Metrogel [metronidazole], Other, Paxil [paroxetine hcl], Prednisone, and Zomig  [zolmitriptan]   Review of Systems Review of Systems  Constitutional: Positive for fever.  Respiratory: Negative for cough and shortness of breath.   Cardiovascular: Negative for chest pain.  Gastrointestinal: Negative for abdominal pain and vomiting.  Genitourinary: Negative for dysuria.  Skin: Negative for rash.  Neurological: Positive for weakness.  All other systems reviewed and are negative.    Physical Exam Updated Vital Signs BP 133/78   Pulse 89   Temp 98.1  F (36.7 C) (Oral)   Resp 17   SpO2 100%   Physical Exam Vitals signs and nursing note reviewed.  Constitutional:      General: She is not in acute distress.    Appearance: She is well-developed. She is not ill-appearing or diaphoretic.  HENT:     Head: Normocephalic and atraumatic.     Right Ear: External ear normal.     Left Ear: External ear normal.     Nose: Nose normal.  Eyes:     General:        Right eye: No discharge.        Left eye: No discharge.  Neck:     Musculoskeletal: Normal range of motion. No neck rigidity.  Cardiovascular:     Rate and Rhythm: Normal rate and regular rhythm.     Heart sounds: Normal heart sounds.  Pulmonary:     Effort: Pulmonary effort is normal.     Breath sounds: Normal breath sounds.     Comments: Left chest port in place, no tenderness Abdominal:     General: There is no distension.     Palpations: Abdomen is soft.     Tenderness: There is no abdominal tenderness.  Skin:    General: Skin is warm and dry.     Findings: No rash.  Neurological:     Mental Status: She is alert.  Psychiatric:        Mood and Affect: Mood is not anxious.      ED Treatments / Results  Labs (all labs ordered are listed, but only abnormal results are displayed) Labs Reviewed  COMPREHENSIVE METABOLIC PANEL - Abnormal; Notable for the following components:      Result Value   Potassium 3.3 (*)    Calcium 8.5 (*)    AST 62 (*)    ALT 47 (*)    Alkaline Phosphatase 128 (*)     All other components within normal limits  CBC WITH DIFFERENTIAL/PLATELET - Abnormal; Notable for the following components:   RBC 3.07 (*)    Hemoglobin 10.1 (*)    HCT 28.6 (*)    Platelets 118 (*)    All other components within normal limits  CK - Abnormal; Notable for the following components:   Total CK 22 (*)    All other components within normal limits  URINALYSIS, ROUTINE W REFLEX MICROSCOPIC - Abnormal; Notable for the following components:   Color, Urine STRAW (*)    All other components within normal limits  SARS CORONAVIRUS 2 (HOSPITAL ORDER, [facility])  CULTURE, BLOOD (ROUTINE X 2)  CULTURE, BLOOD (ROUTINE X 2)  URINE CULTURE    EKG EKG Interpretation  Date/Time:  Friday February 18 2019 18:14:22 EDT Ventricular Rate:  79 PR Interval:    QRS Duration: 107 QT Interval:  410 QTC Calculation: 470 R Axis:   50 Text Interpretation:  Sinus rhythm no acute ST/T changes no significant change since 2019 Confirmed by Sherwood Gambler [phone]) on 02/18/2019 7:42:56 PM   Radiology Dg Chest Portable 1 View  Result Date: 02/18/2019 CLINICAL DATA:  Fever, chemotherapy EXAM: PORTABLE CHEST 1 VIEW COMPARISON:  07/29/2018 FINDINGS: The heart size and mediastinal contours are within normal limits. Left chest port catheter. Both lungs are clear. Saline tissue expander valve projects over the right lung base. The visualized skeletal structures are unremarkable. IMPRESSION: No acute abnormality of the lungs in AP portable projection. Electronically Signed   By: Eddie Candle  M.D.   On: 02/18/2019 18:08    Procedures Procedures (including critical care time)  Medications Ordered in ED Medications  potassium chloride SA (K-DUR) CR tablet 40 mEq (has no administration in time range)  sodium chloride 0.9 % bolus 1,000 mL (1,000 mLs Intravenous New Bag/Given 02/18/19 1807)     Initial Impression / Assessment and Plan / ED Course  I have reviewed the triage  vital signs and the nursing notes.  Pertinent labs & imaging results that were available during my care of the patient were reviewed by me and considered in my medical decision making (see chart for details).        Patient had 1 temperature over 99 which was 100.7 a couple days ago.  Otherwise has had a bunch of low-grade temperatures.  No obvious source of an infection.  She feels generally weak though this is a little bit worse than her typical feeling.  At this point, I am not sure she really needs antibiotics though I have drawn blood cultures and she will need to follow-up closely with her oncologist.  If anything worsens or she develops a fever I discussed calling the oncologist and/or returning to the ER.  Discharged home with return precautions.  Claire Guzman was evaluated in Emergency Department on 02/18/2019 for the symptoms described in the history of present illness. She was evaluated in the context of the global COVID-19 pandemic, which necessitated consideration that the patient might be at risk for infection with the SARS-CoV-2 virus that causes COVID-19. Institutional protocols and algorithms that pertain to the evaluation of patients at risk for COVID-19 are in a state of rapid change based on information released by regulatory bodies including the CDC and federal and state organizations. These policies and algorithms were followed during the patient's care in the ED.   Final Clinical Impressions(s) / ED Diagnoses   Final diagnoses:  Generalized weakness    ED Discharge Orders    None       Sherwood Gambler, MD 02/18/19 2005

## 2019-02-18 NOTE — Telephone Encounter (Signed)
Patient calls today with stating feeling worse, not really eating much but abdomen feels swollen, which she sneezes she gets a sharp pain in her back, feels weak, continues to feel achy with low grade temps.  Advised patient to go to [facility] ED to be evaluated, she verbalized an understanding.

## 2019-02-18 NOTE — Discharge Instructions (Addendum)
If you develop fever, vomiting, or any other new/concerning symptoms, then call your oncologist and/or come to the ER/call 911.

## 2019-02-18 NOTE — ED Notes (Signed)
EKG given to [place] MD 

## 2019-02-18 NOTE — ED Triage Notes (Signed)
Pt complains of weakness, body aches, and fever. Pt is receiving chemotherapy for breast cancer. Pt took tylenol at 1030PM today. Pt also complains of muscle spasms in her back for the past couple of weeks.

## 2019-02-19 LAB — URINE CULTURE: Culture: >=100000 — AB

## 2019-02-20 ENCOUNTER — Telehealth: Payer: Self-pay | Admitting: *Deleted

## 2019-02-20 NOTE — ED Provider Notes (Signed)
  Pharmacist brought urine culture to my attention, positive for greater than 100,000 colonies of group B strep.  On chart review, it seems as though patient was having symptoms of body aches, generalized weakness, and fever.  No mention of urinary symptoms.  I advised the pharmacist to contact the patient.  If she is now having urinary symptoms, we will place an order for antibiotic therapy.  However, if she is only having the same symptoms she was complaining about before, we will have her follow-up closely with her oncology team.  I also sent a message to patient's oncologist, Dr. Burr Medico, to make her aware of the urine culture as well as the advice for the patient to follow-up with her.  Dr. Burr Medico replied to my message and stated she would have her office contact the patient.  She also told me I did not need to take any further action and she would address any treatments that may need to be initiated.  Abnormal Labs Reviewed  URINE CULTURE - Abnormal; Notable for the following components:      Result Value   Culture   (*)    Value: >=100,000 COLONIES/mL GROUP B STREP(S.AGALACTIAE)ISOLATED TESTING AGAINST S. AGALACTIAE NOT ROUTINELY PERFORMED DUE TO PREDICTABILITY OF AMP/PEN/VAN SUSCEPTIBILITY. Performed at [facility], [place] [address], [place], [place] [address]    All other components within normal limits  COMPREHENSIVE METABOLIC PANEL - Abnormal; Notable for the following components:   Potassium 3.3 (*)    Calcium 8.5 (*)    AST 62 (*)    ALT 47 (*)    Alkaline Phosphatase 128 (*)    All other components within normal limits  CBC WITH DIFFERENTIAL/PLATELET - Abnormal; Notable for the following components:   RBC 3.07 (*)    Hemoglobin 10.1 (*)    HCT 28.6 (*)    Platelets 118 (*)    All other components within normal limits  CK - Abnormal; Notable for the following components:   Total CK 22 (*)    All other components within normal limits  URINALYSIS, ROUTINE W REFLEX  MICROSCOPIC - Abnormal; Notable for the following components:   Color, Urine STRAW (*)    All other components within normal limits        Claire Guzman 02/21/19 1537    Dorie Rank, MD 02/25/19 [phone]

## 2019-02-20 NOTE — Telephone Encounter (Signed)
Post ED Visit - Positive Culture Follow-up: Unsuccessful Patient Follow-up  Culture assessed and recommendations reviewed by:  []  Elenor Quinones, Pharm.D. []  Heide Guile, Pharm.D., BCPS AQ-ID []  Parks Neptune, Pharm.D., BCPS []  Alycia Rossetti, Pharm.D., BCPS []  [place], [place].D., BCPS, AAHIVP []  Legrand Como, Pharm.D., BCPS, AAHIVP []  Wynell Balloon, PharmD []  Vincenza Hews, PharmD, BCPS Reuel Boom, PharmD  Positive urine culture  [x]  Patient discharged without antimicrobial prescription and treatment is now indicated []  Organism is resistant to prescribed ED discharge antimicrobial []  Patient with positive blood cultures   Unable to contact patient after 3 attempts, letter will be sent to address on file  Ardeen Fillers 02/20/2019, 2:10 PM

## 2019-02-21 ENCOUNTER — Other Ambulatory Visit: Payer: Self-pay | Admitting: Hematology

## 2019-02-21 ENCOUNTER — Telehealth: Payer: Self-pay | Admitting: Hematology

## 2019-02-21 MED ORDER — AMOXICILLIN-POT CLAVULANATE 875-125 MG PO TABS: 1.0000 | ORAL_TABLET | Freq: Two times a day (BID) | ORAL | 0 refills | Status: DC

## 2019-02-21 NOTE — Progress Notes (Signed)
[place]   [phone] [phone]   Clinic Follow up Note   Patient Care Team: Lujean Amel, MD as PCP - General (Family Medicine) Fanny Skates, MD as Consulting Physician (General Surgery) Truitt Merle, MD as Consulting Physician (Hematology) Eppie Gibson, MD as Attending Physician (Radiation Oncology) Mauro Kaufmann, RN as Oncology Nurse Navigator Rockwell Germany, RN as Oncology Nurse Navigator  Date of Service:  02/25/2019  CHIEF COMPLAINT: F/u for right breast cancer  SUMMARY OF ONCOLOGIC HISTORY: Oncology History Overview Note  Cancer Staging Malignant neoplasm of upper-inner quadrant of right breast in female, estrogen receptor positive ([place]) Staging form: Breast, AJCC 8th Edition - Clinical stage from 03/08/2018: Stage IB (cT2, cN1, cM0, G3, ER+, PR+, HER2+) - Signed by Truitt Merle, MD on 03/16/2018     Malignant neoplasm of upper-inner quadrant of right breast in female, estrogen receptor positive ([place])  02/24/2018 Mammogram   screening mammogram on 02/24/2018 that was benign   03/04/2018 Mammogram   diagnostic mammogram on 03/04/2018 due to a palpable mass on her right breast. Diagnostic mammogram, breast US revealed and biopsy revealed suspicious mass in the right breast at 2:30 at the palpable site of concern and one suspicious right axillary LN.   03/08/2018 Cancer Staging   Staging form: Breast, AJCC 8th Edition - Clinical stage from 03/08/2018: Stage IB (cT2, cN1, cM0, G3, ER+, PR+, HER2+) - Signed by Truitt Merle, MD on 03/16/2018   03/08/2018 Receptors her2   Her2 positive, ER 90% PR 5% and Ki67 60%   03/08/2018 Pathology Results   Diagnosis 1. Breast, right, needle core biopsy, 2:30 - INVASIVE DUCTAL CARCINOMA, GRADE 3. - LYMPHOVASCULAR SPACE INVOLVEMENT BY TUMOR. 2. Lymph node, needle/core biopsy, right axilla - METASTATIC CARCINOMA INVOLVING SCANT LYMPHOID TISSUE.   03/10/2018 Initial Diagnosis   Malignant neoplasm of  upper-inner quadrant of right breast in female, estrogen receptor positive ([place])   03/20/2018 Imaging   MRI brain 03/20/18 IMPRESSION: No evidence of metastatic disease.  Normal appearance of brain. Slightly heterogeneous marrow pattern of the upper cervical spine, nonspecific but likely benign, especially in the setting of early stage disease.    03/24/2018 Echocardiogram   Baseline ECHO 03/24/18 Study Conclusions - Left ventricle: The cavity size was normal. Wall thickness was   increased in a pattern of mild LVH. Systolic function was normal.   The estimated ejection fraction was in the range of 60% to 65%.   Wall motion was normal; there were no regional wall motion   abnormalities. Doppler parameters are consistent with abnormal   left ventricular relaxation (grade 1 diastolic dysfunction). - Right ventricle: The cavity size was mildly dilated. Wall   thickness was normal.   03/25/2018 Imaging   MRI Breast B/l 03/25/18 IMPRESSION: 1. The patient's primary malignancy in the posterior right breast measures 3.2 x 2 x 2.6 cm with apparent invasion of the underlying pectoralis muscle. Numerous surrounding abnormal enhancing masses, consistent with satellite lesions, extend from 11 o'clock in the right breast into the inferolateral right breast with a total span of suspected disease measuring 4.5 x 4.6 x 6.4 cm in AP, transverse, and craniocaudal dimension. The suspected extent of disease involves the superior and inferolateral quadrants. The primary malignancy also extends just across midline into the superior medial quadrant. 2. No MRI evidence of malignancy in the left breast. 3. Known metastatic noted in the right axilla.   03/25/2018 Imaging   Whole body bone scan 03/25/18 IMPRESSION: No scintigraphic evidence skeletal  metastasis.   03/25/2018 Imaging   CT CAP W Contrast 03/25/18 IMPRESSION: 1. Mass within the deep aspect of the right breast along the pectoralis muscle  compatible with primary breast malignancy. 2. There is suggestion of two additional possible masses within the lateral aspect of the right breast versus nodular appearing breast tissue. Recommend dedicated evaluation with bilateral breast MRI. 3. Mildly thickened right axillary lymph node compatible with metastatic adenopathy, recently biopsied. 4. Multiple bilateral pulmonary nodules are indeterminate, potentially sequelae of prior infectious/inflammatory process. Recommend attention on follow-up. Consider follow-up CT in 6 months. 5. Portal venous gas is demonstrated within the left hepatic lobe. Additionally, there is wall thickening of the cecum and ascending colon with small amount of gas within the pericolonic vasculature. Constellation of findings is indeterminate in etiology however may be secondary to colitis at this location with considerations including infectious, inflammatory or ischemic etiologies. Correlate for recent procedure. If patient is not up-to-date for colonic screening, recommend further evaluation with colonoscopy to exclude the possibility of colonic mass within the cecum/ascending colon. 6. These results were called by telephone at the time of interpretation on 03/25/2018 at 9:31 am to Dr. Truitt Merle , who verbally acknowledged these results.   03/30/2018 - 08/11/2018 Chemotherapy   Neoadjuvant TCHP every 3 weeks starting 03/30/18. Completed 6 cycles of TCHP on 07/23/18.  Continued with maintenance Herceptin/Perjeta q3weeks to complete 1 year.    04/09/2018 Genetic Testing   Negative genetic testing on the [facility] Common Hereditary Cancers Panel. The Common Hereditary Cancers Panel offered by [facility] includes sequencing and/or deletion duplication testing of the following 47 genes: APC, ATM, AXIN2, BARD1, BMPR1A, BRCA1, BRCA2, BRIP1, CDH1, CDKN2A (p14ARF), CDKN2A (p16INK4a), CKD4, CHEK2, CTNNA1, DICER1, EPCAM (Deletion/duplication testing only), GREM1 (promoter region  deletion/duplication testing only), KIT, MEN1, MLH1, MSH2, MSH3, MSH6, MUTYH, NBN, NF1, NHTL1, PALB2, PDGFRA, PMS2, POLD1, POLE, PTEN, RAD50, RAD51C, RAD51D, SDHB, SDHC, SDHD, SMAD4, SMARCA4. STK11, TP53, TSC1, TSC2, and VHL.  The following genes were evaluated for sequence changes only: SDHA and HOXB13 c.251G>A variant only.   Genetic testing did detect a Variant of Unknown Significance (VUS) in the POLD1 gene called c.985C>T (p.Pro329Ser). At this time, it is unknown if this variant is associated with increased cancer risk or if this is a normal finding, but most variants such as this get reclassified to being inconsequential. It should not be used to make medical management decisions.   The report date is 04/09/2018.   07/24/2018 Breast MRI   IMPRESSION: 1. Resolution of the enhancing mass (known cancer) and small satellite lesions previously seen in the right breast following chemotherapy.  2. The metastatic lymph node in the right axilla has decreased in size.  3.  No MRI evidence of left breast malignancy.   09/14/2018 Surgery   RIGHT NIPPLE SPARING MASTECTOMY WITH TARGETED DEEP RIGHT AXILLARY LYMPH NODE BIOPSY WITH RADIOACTIVE SEED LOCALIZATION AND RIGHT AXILLARY SENTINEL LYMPH NODE BIOPSY by Dr. Dalbert Batman  09/14/18    09/14/2018 Pathology Results   Diagnosis 1. Lymph node, sentinel, biopsy, right axillary with radioactive seed - ONE OF ONE LYMPH NODES NEGATIVE FOR CARCINOMA (0/1). - MILD TREATMENT EFFECT. - BIOPSY SITE. 2. Lymph node, sentinel, biopsy, right axillary - ONE OF ONE LYMPH NODES NEGATIVE FOR CARCINOMA (0/1). 3. Lymph node, sentinel, biopsy, right - ONE OF ONE LYMPH NODES NEGATIVE FOR CARCINOMA (0/1). 4. Lymph node, sentinel, biopsy, right - ONE OF ONE LYMPH NODES NEGATIVE FOR CARCINOMA (0/1). 5. Lymph node, sentinel, biopsy, right - ONE OF  ONE LYMPH NODES NEGATIVE FOR CARCINOMA (0/1). 6. Lymph node, sentinel, biopsy, right - ONE OF ONE LYMPH NODES NEGATIVE FOR  CARCINOMA (0/1). 7. Lymph node, sentinel, biopsy, right - ONE OF ONE LYMPH NODES NEGATIVE FOR CARCINOMA (0/1). 8. Lymph node, sentinel, biopsy, right - ONE OF ONE LYMPH NODES NEGATIVE FOR CARCINOMA (0/1). 9. Lymph node, sentinel, biopsy, right - ONE OF ONE LYMPH NODES NEGATIVE FOR CARCINOMA (0/1). 10. Lymph node, sentinel, biopsy, right - ONE OF ONE LYMPH NODES NEGATIVE FOR CARCINOMA (0/1). 11. Lymph node, sentinel, biopsy, right - ONE OF ONE LYMPH NODES NEGATIVE FOR CARCINOMA (0/1). 12. Lymph node, sentinel, biopsy, right - ONE OF ONE LYMPH NODES NEGATIVE FOR CARCINOMA (0/1). 13. Nipple Biopsy, right - BENIGN NIPPLE TISSUE. 14. Breast, simple mastectomy, right - NO RESIDUAL CARCINOMA IDENTIFIED. - TREATMENT EFFECT. - FIBROCYSTIC CHANGE. - ONE OF ONE LYMPH NODES NEGATIVE FOR CARCINOMA (0/1). - SEE ONCOLOGY TABLE.   09/14/2018 Cancer Staging   Staging form: Breast, AJCC 8th Edition - Pathologic stage from 09/14/2018: No Stage Recommended (ypT0, pN0, cM0, GX, ER: Not Assessed, PR: Not Assessed, HER2: Not Assessed) - Signed by Truitt Merle, MD on 09/23/2018   11/03/2018 - 12/10/2018 Radiation Therapy   Adjuvant Radiation with Dr. Isidore Moos    12/27/2018 -  Anti-estrogen oral therapy   Tamoxifen 24m daily starting 12/27/18. Start Tamoxifen 219mdaily in 12/27/2018 stopped after 1 week due to poor toleration.    01/04/2019 Imaging   CT chest  IMPRESSION: 1. Stable tiny subpleural and perifissural nodules bilaterally. No suspicious pulmonary nodule or mass. 2. No focal airspace disease in the right lung. 3.  Aortic Atherosclerois (ICD10-170.0)        CURRENT THERAPY:  -Maintenance Herceptin/Perjeta q3weeks to complete 1 year treatmentin9/2020 -Start Tamoxifen 2061mily in 6/1/2020stopped after 1 week due to poor toleration. She started anastrozole on 02/04/19 but stopped on 02/14/19 due to worsening muscle aches.   INTERVAL HISTORY:  JenALEESE KAMPS here for a follow up and treatment.  She presents to the clinic alone. She notes she is has bene having muscle spasms in her lower and cervical back. This is present with muscle stretching and changing positions fast or after a long time. She is on calcium supplement.  She notes being nauseous at night. She has been gaining weight although her appetite is lower. She feels her energy is starting to increase lately.  She denies nay more low grade fever.    REVIEW OF SYSTEMS:   Constitutional: Denies fevers, chills (+) energy improved (+) lower appetite, but has weight gain  Eyes: Denies blurriness of vision Ears, nose, mouth, throat, and face: Denies mucositis or sore throat Respiratory: Denies cough, dyspnea or wheezes Cardiovascular: Denies palpitation, chest discomfort or lower extremity swelling Gastrointestinal:  Denies nausea, heartburn or change in bowel habits Skin: Denies abnormal skin rashes MKS: (+) Diffuse body muscle spasms Lymphatics: Denies new lymphadenopathy or easy bruising Neurological:Denies numbness, tingling or new weaknesses Behavioral/Psych: Mood is stable, no new changes  All other systems were reviewed with the patient and are negative.  MEDICAL HISTORY:  Past Medical History:  Diagnosis Date  . Abdominal pain, unspecified site   . Anxiety state, unspecified   . Complication of anesthesia    convulsions - when she has appendectomy about 12 years ago at [facility]  . Contact dermatitis and other eczema, due to unspecified cause   . Essential hypertension, benign   . Family history of colon cancer   . Family history  of melanoma   . Family history of prostate cancer   . GERD (gastroesophageal reflux disease)   . Hematuria, unspecified   . Hypertension   . Lower back pain   . Mixed hyperlipidemia   . Rosacea   . rt breast ca dx'd 01/2018   R breast cancer  . Unspecified symptom associated with female genital organs     SURGICAL HISTORY: Past Surgical History:  Procedure Laterality Date  .  BREAST RECONSTRUCTION WITH PLACEMENT OF TISSUE EXPANDER AND ALLODERM Right 09/14/2018   Procedure: BREAST RECONSTRUCTION WITH PLACEMENT OF TISSUE EXPANDER AND ALLODERM;  Surgeon: Irene Limbo, MD;  Location: [place];  Service: Plastics;  Laterality: Right;  . CESAREAN SECTION    . DIAGNOSTIC LAPAROSCOPY    . EYE SURGERY     lasix  . LAPAROSCOPIC APPENDECTOMY    . MASTECTOMY WITH RADIOACTIVE SEED GUIDED EXCISION AND AXILLARY SENTINEL LYMPH NODE BIOPSY Right 09/14/2018   Procedure: RIGHT NIPPLE SPARING MASTECTOMY WITH TARGETED DEEP RIGHT AXILLARY LYMPH NODE BIOPSY WITH RADIOACTIVE SEED LOCALIZATION AND RIGHT AXILLARY SENTINEL LYMPH NODE BIOPSY, FROZEN SECTION AND POSSIBLE COMPLETION AXILLARY LYMPH NODE DISSECTION INJECT BLUE DYE RIGHT BREAST;  Surgeon: Fanny Skates, MD;  Location: [place];  Service: General;  Laterality: Right;  . PORTACATH PLACEMENT Left 04/13/2018   Procedure: INSERTION PORT-A-CATH WITH [place];  Surgeon: Fanny Skates, MD;  Location: [place];  Service: General;  Laterality: Left;  . UTERINE FIBROID SURGERY      I have reviewed the social history and family history with the patient and they are unchanged from previous note.  ALLERGIES:  is allergic to metrogel [metronidazole]; other; paxil [paroxetine hcl]; prednisone; and zomig [zolmitriptan].  MEDICATIONS:  Current Outpatient Medications  Medication Sig Dispense Refill  . ALPRAZolam (XANAX) 0.5 MG tablet TAKE 1 TABLET(0.5 MG) BY MOUTH AT BEDTIME AS NEEDED FOR ANXIETY 30 tablet 0  . amoxicillin-clavulanate (AUGMENTIN) 875-125 MG tablet Take 1 tablet by mouth 2 (two) times daily. 10 tablet 0  . anastrozole (ARIMIDEX) 1 MG tablet Take 1 tablet (1 mg total) by mouth daily. 30 tablet 2  . atenolol (TENORMIN) 50 MG tablet Take 50 mg by mouth at bedtime.    . cyclobenzaprine (FLEXERIL) 5 MG tablet Take 1 tablet (5 mg total) by mouth 3 (three) times daily as needed for muscle spasms. 30 tablet 0  . gabapentin  (NEURONTIN) 300 MG capsule Take 1 capsule (300 mg total) by mouth 3 (three) times daily. 45 capsule 1  . HYDROcodone-acetaminophen (NORCO) 5-325 MG tablet Take 1-2 tablets by mouth every 4 (four) hours as needed for moderate pain or severe pain. 40 tablet 0  . omeprazole (PRILOSEC) 20 MG capsule Take 1 capsule (20 mg total) by mouth 2 (two) times daily before a meal. 60 capsule 2  . potassium chloride (KLOR-CON) 20 MEQ packet Take 20 mEq by mouth 2 (two) times daily. Twice daily for 7 days then once daily 30 packet 1  . potassium chloride SA (K-DUR) 20 MEQ tablet Take one tab dialy 30 tablet 3  . tamoxifen (NOLVADEX) 20 MG tablet Take 1 tablet (20 mg total) by mouth daily. 30 tablet 2  . traMADol (ULTRAM) 50 MG tablet Take 1 tablet BID PRN pain 30 tablet 0  . venlafaxine (EFFEXOR) 37.5 MG tablet Take 1 tablet (37.5 mg total) by mouth 2 (two) times daily. 30 tablet 3   No current facility-administered medications for this visit.     PHYSICAL EXAMINATION: ECOG PERFORMANCE STATUS: 2 -  Symptomatic, <50% confined to bed  Vitals:   02/25/19 1144  BP: 108/68  Pulse: 85  Resp: 18  Temp: 98.2 F (36.8 C)  SpO2: 98%   Filed Weights   02/25/19 1144  Weight: 154 lb 12.8 oz (70.2 kg)    GENERAL:alert, no distress and comfortable SKIN: skin color, texture, turgor are normal, no rashes or significant lesions EYES: normal, Conjunctiva are pink and non-injected, sclera clear  NECK: supple, thyroid normal size, non-tender, without nodularity LYMPH:  no palpable lymphadenopathy in the cervical, axillary  LUNGS: clear to auscultation and percussion with normal breathing effort HEART: regular rate & rhythm and no murmurs and no lower extremity edema  ABDOMEN:abdomen soft, non-tender and normal bowel sounds (+) Epigastric and LRQ tenderness Musculoskeletal:no cyanosis of digits and no clubbing  NEURO: alert & oriented x 3 with fluent speech, no focal motor/sensory deficits  LABORATORY DATA:  I  have reviewed the data as listed CBC Latest Ref Rng & Units 02/25/2019 02/18/2019 01/27/2019  WBC 4.0 - 10.5 K/uL 5.2 5.9 4.2  Hemoglobin 12.0 - 15.0 g/dL 10.2(L) 10.1(L) 11.8(L)  Hematocrit 36.0 - 46.0 % 29.0(L) 28.6(L) 32.7(L)  Platelets 150 - 400 K/uL 134(L) 118(L) 174     CMP Latest Ref Rng & Units 02/25/2019 02/18/2019 01/27/2019  Glucose 70 - 99 mg/dL 99 97 108(H)  BUN 6 - 20 mg/dL _0 Creatinine 0.44 - 1.00 mg/dL 0.65 0.54 0.70  Sodium 135 - 145 mmol/L 141 137 143  Potassium 3.5 - 5.1 mmol/L 3.9 3.3(L) 3.4(L)  Chloride 98 - 111 mmol/L 108 102 108  CO2 22 - 32 mmol/L _1 Calcium 8.9 - 10.3 mg/dL 8.8(L) 8.5(L) 8.8(L)  Total Protein 6.5 - 8.1 g/dL 7.0 6.6 7.3  Total Bilirubin 0.3 - 1.2 mg/dL 0.4 0.8 0.3  Alkaline Phos 38 - 126 U/L 160(H) 128(H) 133(H)  AST 15 - 41 U/L 33 62(H) 18  ALT 0 - 44 U/L 28 47(H) 13      RADIOGRAPHIC STUDIES: I have personally reviewed the radiological images as listed and agreed with the findings in the report. No results found.   ASSESSMENT & PLAN:  Claire Guzman is a 53 y.o. female with   1.Malignant neoplasm of upper-inner quadrant of right breast in female, invasive ductal carcinoma, cT2N1M0 stage IB, ER+/PR+/HER2+, G3, ypT0N0 -She was diagnosed in 02/2018. Given the size of her mass and LN involvement, predicts highrisk of recurrence.  -She is s/pneoadjuvantchemoof 6 cyclesTCHP,right breast mastectomyandaxillary lymph node dissectionon 2/18/20and adjuvant radiation. -I discussedthat complete pathological response predict good outcome, her risk of recurrence is small. -Sheis currently on maintenanceHerceptinand Perjeta(due to node positive disease at diagnosis)to complete 1 year of treatment in9/2020. She is tolerating well with mild diarrhea. -She plans to contact Dr. Iran Planas to schedule her reconstruction surgery.  -To reduce her risk of distant breast cancer, I started her on anti-estrogen therapy with Tamoxifen  in 12/2018. She experienced heart palpitations, taste change and worsened neuropathy and hot flashes. She stopped on 01/03/19.  -I started her on Anastrozole on 7/10 but stopped on 7/20 due to her worsening body aches and recent infection. -she was seen in ED last week for low grade fever and body aches, and was treated for UTI. Although she overall feels better now and fever resolved, she continues to have significant muscle spasms, mild nightly nausea and lower appetite. No musculoskeletal tenderness on exam today. She did have epigastric and RLQ tenderness on exam today. I recommend  CT abdomen and bone scan to rule out breast cancer recurrence    -Will plan to start exemestane on next visit which has less effects on her joints.  -Labs reviewed, CBC and CMP WNL except Hg 10.2, PLT 134, Ca 8.8, alk phos 160. CA 27.29 still pending. Overall adequate to proceed with Herceptin/Perjeta today  -F/u in 3 weeks    2. Geneticsshowed no pathogenic mutations.   3. Anxiety  -She has history of anxiety, currently on Xanax as needed -Stable, she has been stressed with her family andunemploymentlately.  -On Effexor 37.81m daily.   4. HTN -She will continue atenolol. -BP is well controlled, 108/68today (02/25/19)  5. Bilateral small lung nodules -Her staging CT scan showed bilateral multiple small lung nodules, 3 to 4 mm, indeterminate, likely benign. She is a never smoker -CT chest from 01/04/28 shows stable tiny b/l nodules, no concerns for malignancy. I reviewed the images and discussed with her    6. Diarrhea, Hypokalemia   -secondary to Perjeta  -I advised her to take 2 imodium every morning and then continue as needed through out day up to 8 tabs. -Loose stool lately.  -Her K is normal today (02/25/19). 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   8. Diffuse body Muscle Spasm -Mainly with stretching and changing positions.  Primarily in her back  -She did have low potassium in hospital and has had lower calcium in the past. She is on oral calcium and potassium  -She has tried muscle relaxer which help for part of the time. I refilled flexeril today (02/25/19) -I will test Magnesium level at next visit. If low she will start magnesium supplement.    Plan: -I refilled flexeril today  -Plan to start exemestane on next visit  -Labs reviewed, Will proceed with Herceptin/Perjetatoday  -CT CAP W contrast and bone scan in 2-3 weeks  -Lab, f/u and Herceptin/Perjeta in 3 weeks    No problem-specific Assessment & Plan notes found for this encounter.   Orders Placed This Encounter  Procedures  . CT Abdomen Pelvis W Contrast    Standing Status:   Future    Standing Expiration Date:   02/25/2020    Order Specific Question:   If indicated for the ordered procedure, I authorize the administration of contrast media per Radiology protocol    Answer:   Yes    Order Specific Question:   Is patient pregnant?    Answer:   No    Order Specific Question:   [facility]    [facility]   Order Specific Question:   Is Oral Contrast requested for this exam?    Answer:   Yes, Per Radiology protocol    Order Specific Question:   Radiology Contrast Protocol - do NOT remove file path    Answer:   \\[email or web address]  . NM Bone Scan Whole Body    Standing Status:   Future    Standing Expiration Date:   02/25/2020    Order Specific Question:   If indicated for the ordered procedure, I authorize the administration of a radiopharmaceutical per Radiology protocol    Answer:   Yes    Order Specific Question:   Is the patient pregnant?    Answer:   No    Order Specific Question:   [facility]    [facility]   Order Specific  Question:   Radiology Contrast Protocol - do NOT remove file path    Answer:    \\[email or web address]   All questions were answered. The patient knows to call the clinic with any problems, questions or concerns. No barriers to learning was detected. I spent 20 minutes counseling the patient face to face. The total time spent in the appointment was 25 minutes and more than 50% was on counseling and review of test results     Truitt Merle, MD 02/25/2019   I, Joslyn Devon, am acting as scribe for Truitt Merle, MD.   I have reviewed the above documentation for accuracy and completeness, and I agree with the above.

## 2019-02-21 NOTE — Telephone Encounter (Signed)
I called pt to follow up her ED visit last Friday. She does noticed some dysuria and persistent low grade fever in the past few days, and low back pain. I discussed her lab results from ED visit, and positive urine culture. I will call in Augmentin 875mg  bid for 5 days, and see her back this Friday as it's scheduled. She agrees with the plan.  Truitt Merle  02/21/2019

## 2019-02-23 LAB — CULTURE, BLOOD (ROUTINE X 2)
Culture: NO GROWTH
Culture: NO GROWTH

## 2019-02-24 ENCOUNTER — Telehealth: Payer: Self-pay

## 2019-02-24 NOTE — Telephone Encounter (Signed)
Called for symptom check for [facility] E per Reuel Boom Pharm D 02/18/2019 per PT has followed up with Oncologist and  Given abx ttreatment for UTI

## 2019-02-25 ENCOUNTER — Inpatient Hospital Stay (HOSPITAL_BASED_OUTPATIENT_CLINIC_OR_DEPARTMENT_OTHER): Payer: BC Managed Care – PPO | Admitting: Hematology

## 2019-02-25 ENCOUNTER — Inpatient Hospital Stay: Payer: BC Managed Care – PPO

## 2019-02-25 ENCOUNTER — Other Ambulatory Visit: Payer: Self-pay

## 2019-02-25 ENCOUNTER — Encounter: Payer: Self-pay | Admitting: Hematology

## 2019-02-25 VITALS — BP 108/68 | HR 85 | Temp 98.2°F | Resp 18 | Ht 64.0 in | Wt 154.8 lb

## 2019-02-25 VITALS — BP 114/66 | HR 84 | Resp 18

## 2019-02-25 DIAGNOSIS — Z170 Estrogen receptor positive status [ER+]: Secondary | ICD-10-CM

## 2019-02-25 DIAGNOSIS — F419 Anxiety disorder, unspecified: Secondary | ICD-10-CM

## 2019-02-25 DIAGNOSIS — Z79899 Other long term (current) drug therapy: Secondary | ICD-10-CM

## 2019-02-25 DIAGNOSIS — Z9011 Acquired absence of right breast and nipple: Secondary | ICD-10-CM

## 2019-02-25 DIAGNOSIS — M62838 Other muscle spasm: Secondary | ICD-10-CM

## 2019-02-25 DIAGNOSIS — E876 Hypokalemia: Secondary | ICD-10-CM | POA: Diagnosis not present

## 2019-02-25 DIAGNOSIS — C50211 Malignant neoplasm of upper-inner quadrant of right female breast: Secondary | ICD-10-CM | POA: Diagnosis not present

## 2019-02-25 DIAGNOSIS — Z5112 Encounter for antineoplastic immunotherapy: Secondary | ICD-10-CM | POA: Diagnosis not present

## 2019-02-25 DIAGNOSIS — C773 Secondary and unspecified malignant neoplasm of axilla and upper limb lymph nodes: Secondary | ICD-10-CM

## 2019-02-25 DIAGNOSIS — R918 Other nonspecific abnormal finding of lung field: Secondary | ICD-10-CM

## 2019-02-25 DIAGNOSIS — R197 Diarrhea, unspecified: Secondary | ICD-10-CM

## 2019-02-25 DIAGNOSIS — I10 Essential (primary) hypertension: Secondary | ICD-10-CM

## 2019-02-25 DIAGNOSIS — Z95828 Presence of other vascular implants and grafts: Secondary | ICD-10-CM

## 2019-02-25 DIAGNOSIS — G620 Drug-induced polyneuropathy: Secondary | ICD-10-CM

## 2019-02-25 LAB — CBC WITH DIFFERENTIAL (CANCER CENTER ONLY)
Abs Immature Granulocytes: 0.01 10*3/uL (ref 0.00–0.07)
Basophils Absolute: 0 10*3/uL (ref 0.0–0.1)
Basophils Relative: 1 %
Eosinophils Absolute: 0.1 10*3/uL (ref 0.0–0.5)
Eosinophils Relative: 2 %
HCT: 29 % — ABNORMAL LOW (ref 36.0–46.0)
Hemoglobin: 10.2 g/dL — ABNORMAL LOW (ref 12.0–15.0)
Immature Granulocytes: 0 %
Lymphocytes Relative: 65 %
Lymphs Abs: 3.5 10*3/uL (ref 0.7–4.0)
MCH: 33.1 pg (ref 26.0–34.0)
MCHC: 35.2 g/dL (ref 30.0–36.0)
MCV: 94.2 fL (ref 80.0–100.0)
Monocytes Absolute: 0.4 10*3/uL (ref 0.1–1.0)
Monocytes Relative: 8 %
Neutro Abs: 1.2 10*3/uL — ABNORMAL LOW (ref 1.7–7.7)
Neutrophils Relative %: 24 %
Platelet Count: 134 10*3/uL — ABNORMAL LOW (ref 150–400)
RBC: 3.08 MIL/uL — ABNORMAL LOW (ref 3.87–5.11)
RDW: 14.1 % (ref 11.5–15.5)
WBC Count: 5.2 10*3/uL (ref 4.0–10.5)
nRBC: 0 % (ref 0.0–0.2)

## 2019-02-25 LAB — CMP (CANCER CENTER ONLY)
ALT: 28 U/L (ref 0–44)
AST: 33 U/L (ref 15–41)
Albumin: 3.6 g/dL (ref 3.5–5.0)
Alkaline Phosphatase: 160 U/L — ABNORMAL HIGH (ref 38–126)
Anion gap: 9 (ref 5–15)
BUN: 9 mg/dL (ref 6–20)
CO2: 24 mmol/L (ref 22–32)
Calcium: 8.8 mg/dL — ABNORMAL LOW (ref 8.9–10.3)
Chloride: 108 mmol/L (ref 98–111)
Creatinine: 0.65 mg/dL (ref 0.44–1.00)
GFR, Est AFR Am: >60 mL/min (ref >60)
GFR, Estimated: >60 mL/min (ref >60)
Glucose, Bld: 99 mg/dL (ref 70–99)
Potassium: 3.9 mmol/L (ref 3.5–5.1)
Sodium: 141 mmol/L (ref 135–145)
Total Bilirubin: 0.4 mg/dL (ref 0.3–1.2)
Total Protein: 7 g/dL (ref 6.5–8.1)

## 2019-02-25 MED ORDER — SODIUM CHLORIDE 0.9% FLUSH
10.0000 mL | INTRAVENOUS | Status: DC | PRN
  Administered 2019-02-25: 10 mL
  Filled 2019-02-25: qty 10

## 2019-02-25 MED ORDER — HEPARIN SOD (PORK) LOCK FLUSH 100 UNIT/ML IV SOLN
500.0000 [IU] | Freq: Once | INTRAVENOUS | Status: AC | PRN
  Administered 2019-02-25: 500 [IU]
  Filled 2019-02-25: qty 5

## 2019-02-25 MED ORDER — TRASTUZUMAB CHEMO 150 MG IV SOLR
450.0000 mg | Freq: Once | INTRAVENOUS | Status: AC
  Administered 2019-02-25: 450 mg via INTRAVENOUS
  Filled 2019-02-25: qty 21.43

## 2019-02-25 MED ORDER — SODIUM CHLORIDE 0.9 % IV SOLN
420.0000 mg | Freq: Once | INTRAVENOUS | Status: AC
  Administered 2019-02-25: 420 mg via INTRAVENOUS
  Filled 2019-02-25: qty 14

## 2019-02-25 MED ORDER — ACETAMINOPHEN 325 MG PO TABS
650.0000 mg | ORAL_TABLET | Freq: Once | ORAL | Status: AC
  Administered 2019-02-25: 650 mg via ORAL

## 2019-02-25 MED ORDER — ACETAMINOPHEN 325 MG PO TABS
ORAL_TABLET | ORAL | Status: AC
  Filled 2019-02-25: qty 2

## 2019-02-25 MED ORDER — DIPHENHYDRAMINE HCL 25 MG PO CAPS
50.0000 mg | ORAL_CAPSULE | Freq: Once | ORAL | Status: AC
  Administered 2019-02-25: 50 mg via ORAL

## 2019-02-25 MED ORDER — DIPHENHYDRAMINE HCL 25 MG PO CAPS
ORAL_CAPSULE | ORAL | Status: AC
  Filled 2019-02-25: qty 23

## 2019-02-25 MED ORDER — CYCLOBENZAPRINE HCL 5 MG PO TABS: 5.0000 mg | ORAL_TABLET | Freq: Three times a day (TID) | ORAL | 0 refills | Status: DC | PRN

## 2019-02-25 MED ORDER — SODIUM CHLORIDE 0.9 % IV SOLN
Freq: Once | INTRAVENOUS | Status: AC
  Administered 2019-02-25: 14:00:00 via INTRAVENOUS
  Filled 2019-02-25: qty 250

## 2019-02-25 NOTE — Progress Notes (Signed)
Pt declined to stay for 30 minute post observation period. VSS upon leaving unit.

## 2019-02-26 LAB — CANCER ANTIGEN 27.29: CA 27.29: 40.5 U/mL — ABNORMAL HIGH (ref 0.0–38.6)

## 2019-02-28 ENCOUNTER — Telehealth: Payer: Self-pay | Admitting: Hematology

## 2019-02-28 NOTE — Telephone Encounter (Signed)
Scheduled appt per 7/31 los.  Spoke with patient and she is aware of her appt date and time.

## 2019-03-01 ENCOUNTER — Other Ambulatory Visit: Payer: Self-pay | Admitting: Nurse Practitioner

## 2019-03-01 DIAGNOSIS — F419 Anxiety disorder, unspecified: Secondary | ICD-10-CM

## 2019-03-02 ENCOUNTER — Other Ambulatory Visit: Payer: Self-pay | Admitting: Hematology

## 2019-03-02 DIAGNOSIS — F419 Anxiety disorder, unspecified: Secondary | ICD-10-CM

## 2019-03-02 MED ORDER — ALPRAZOLAM 0.5 MG PO TABS: ORAL_TABLET | ORAL | 0 refills | Status: DC

## 2019-03-07 DIAGNOSIS — Z01419 Encounter for gynecological examination (general) (routine) without abnormal findings: Secondary | ICD-10-CM | POA: Diagnosis not present

## 2019-03-07 DIAGNOSIS — Z6826 Body mass index (BMI) 26.0-26.9, adult: Secondary | ICD-10-CM | POA: Diagnosis not present

## 2019-03-07 DIAGNOSIS — Z124 Encounter for screening for malignant neoplasm of cervix: Secondary | ICD-10-CM | POA: Diagnosis not present

## 2019-03-08 ENCOUNTER — Other Ambulatory Visit: Payer: Self-pay

## 2019-03-08 ENCOUNTER — Telehealth: Payer: Self-pay | Admitting: Hematology

## 2019-03-08 ENCOUNTER — Ambulatory Visit (HOSPITAL_COMMUNITY)
Admission: RE | Admit: 2019-03-08 | Discharge: 2019-03-08 | Disposition: A | Discharge status: Home / Self Care | Payer: BC Managed Care – PPO | Source: Ambulatory Visit | Attending: Hematology | Admitting: Hematology

## 2019-03-08 DIAGNOSIS — Z5112 Encounter for antineoplastic immunotherapy: Secondary | ICD-10-CM | POA: Diagnosis not present

## 2019-03-08 DIAGNOSIS — C50211 Malignant neoplasm of upper-inner quadrant of right female breast: Secondary | ICD-10-CM | POA: Diagnosis not present

## 2019-03-08 DIAGNOSIS — Z170 Estrogen receptor positive status [ER+]: Secondary | ICD-10-CM | POA: Insufficient documentation

## 2019-03-08 DIAGNOSIS — C50511 Malignant neoplasm of lower-outer quadrant of right female breast: Secondary | ICD-10-CM | POA: Diagnosis not present

## 2019-03-08 MED ORDER — TECHNETIUM TC 99M MEDRONATE IV KIT
20.0000 | PACK | Freq: Once | INTRAVENOUS | Status: AC | PRN
  Administered 2019-03-08: 20 via INTRAVENOUS

## 2019-03-08 MED ORDER — SODIUM CHLORIDE (PF) 0.9 % IJ SOLN
INTRAMUSCULAR | Status: AC
  Filled 2019-03-08: qty 50

## 2019-03-08 MED ORDER — IOHEXOL 300 MG/ML  SOLN
30.0000 mL | Freq: Once | INTRAMUSCULAR | Status: AC
  Administered 2019-03-08: 13:00:00 30 mL via ORAL

## 2019-03-08 MED ORDER — IOHEXOL 300 MG/ML  SOLN
100.0000 mL | Freq: Once | INTRAMUSCULAR | Status: AC | PRN
  Administered 2019-03-08: 100 mL via INTRAVENOUS

## 2019-03-08 NOTE — Telephone Encounter (Signed)
Rescheduled appointments on 08/14 due to it not aligning with current care plan, patient is My chart active and will be notified.

## 2019-03-09 DIAGNOSIS — R1032 Left lower quadrant pain: Secondary | ICD-10-CM | POA: Diagnosis not present

## 2019-03-10 ENCOUNTER — Telehealth: Payer: Self-pay

## 2019-03-10 NOTE — Telephone Encounter (Signed)
-----   Message from Truitt Merle, MD sent at 03/09/2019 11:12 PM EDT ----- Please let pt know her CT and bone scan were negative, no concerns, good news. Thanks  Truitt Merle  03/09/2019

## 2019-03-10 NOTE — Telephone Encounter (Signed)
Per Dr. Burr Medico informed patient CT and bone scan were both negative, no concerns, patient verbalized an understanding and very much appreciated the call.

## 2019-03-11 ENCOUNTER — Other Ambulatory Visit: Payer: BLUE CROSS/BLUE SHIELD

## 2019-03-11 ENCOUNTER — Ambulatory Visit: Payer: BLUE CROSS/BLUE SHIELD

## 2019-03-16 ENCOUNTER — Other Ambulatory Visit (HOSPITAL_COMMUNITY): Payer: BC Managed Care – PPO

## 2019-03-16 ENCOUNTER — Encounter (HOSPITAL_COMMUNITY): Payer: BC Managed Care – PPO

## 2019-03-17 NOTE — Progress Notes (Signed)
[place]   [phone] [phone]   Clinic Follow up Note   Patient Care Team: Lujean Amel, MD as PCP - General (Family Medicine) Fanny Skates, MD as Consulting Physician (General Surgery) Truitt Merle, MD as Consulting Physician (Hematology) Eppie Gibson, MD as Attending Physician (Radiation Oncology) Mauro Kaufmann, RN as Oncology Nurse Navigator Rockwell Germany, RN as Oncology Nurse Navigator  Date of Service:  03/18/2019  CHIEF COMPLAINT: F/u for right breast cancer  SUMMARY OF ONCOLOGIC HISTORY: Oncology History Overview Note  Cancer Staging Malignant neoplasm of upper-inner quadrant of right breast in female, estrogen receptor positive ([place]) Staging form: Breast, AJCC 8th Edition - Clinical stage from 03/08/2018: Stage IB (cT2, cN1, cM0, G3, ER+, PR+, HER2+) - Signed by Truitt Merle, MD on 03/16/2018     Malignant neoplasm of upper-inner quadrant of right breast in female, estrogen receptor positive ([place])  02/24/2018 Mammogram   screening mammogram on 02/24/2018 that was benign   03/04/2018 Mammogram   diagnostic mammogram on 03/04/2018 due to a palpable mass on her right breast. Diagnostic mammogram, breast US revealed and biopsy revealed suspicious mass in the right breast at 2:30 at the palpable site of concern and one suspicious right axillary LN.   03/08/2018 Cancer Staging   Staging form: Breast, AJCC 8th Edition - Clinical stage from 03/08/2018: Stage IB (cT2, cN1, cM0, G3, ER+, PR+, HER2+) - Signed by Truitt Merle, MD on 03/16/2018   03/08/2018 Receptors her2   Her2 positive, ER 90% PR 5% and Ki67 60%   03/08/2018 Pathology Results   Diagnosis 1. Breast, right, needle core biopsy, 2:30 - INVASIVE DUCTAL CARCINOMA, GRADE 3. - LYMPHOVASCULAR SPACE INVOLVEMENT BY TUMOR. 2. Lymph node, needle/core biopsy, right axilla - METASTATIC CARCINOMA INVOLVING SCANT LYMPHOID TISSUE.   03/10/2018 Initial Diagnosis   Malignant neoplasm of  upper-inner quadrant of right breast in female, estrogen receptor positive ([place])   03/20/2018 Imaging   MRI brain 03/20/18 IMPRESSION: No evidence of metastatic disease.  Normal appearance of brain. Slightly heterogeneous marrow pattern of the upper cervical spine, nonspecific but likely benign, especially in the setting of early stage disease.    03/24/2018 Echocardiogram   Baseline ECHO 03/24/18 Study Conclusions - Left ventricle: The cavity size was normal. Wall thickness was   increased in a pattern of mild LVH. Systolic function was normal.   The estimated ejection fraction was in the range of 60% to 65%.   Wall motion was normal; there were no regional wall motion   abnormalities. Doppler parameters are consistent with abnormal   left ventricular relaxation (grade 1 diastolic dysfunction). - Right ventricle: The cavity size was mildly dilated. Wall   thickness was normal.   03/25/2018 Imaging   MRI Breast B/l 03/25/18 IMPRESSION: 1. The patient's primary malignancy in the posterior right breast measures 3.2 x 2 x 2.6 cm with apparent invasion of the underlying pectoralis muscle. Numerous surrounding abnormal enhancing masses, consistent with satellite lesions, extend from 11 o'clock in the right breast into the inferolateral right breast with a total span of suspected disease measuring 4.5 x 4.6 x 6.4 cm in AP, transverse, and craniocaudal dimension. The suspected extent of disease involves the superior and inferolateral quadrants. The primary malignancy also extends just across midline into the superior medial quadrant. 2. No MRI evidence of malignancy in the left breast. 3. Known metastatic noted in the right axilla.   03/25/2018 Imaging   Whole body bone scan 03/25/18 IMPRESSION: No scintigraphic evidence skeletal  metastasis.   03/25/2018 Imaging   CT CAP W Contrast 03/25/18 IMPRESSION: 1. Mass within the deep aspect of the right breast along the pectoralis muscle  compatible with primary breast malignancy. 2. There is suggestion of two additional possible masses within the lateral aspect of the right breast versus nodular appearing breast tissue. Recommend dedicated evaluation with bilateral breast MRI. 3. Mildly thickened right axillary lymph node compatible with metastatic adenopathy, recently biopsied. 4. Multiple bilateral pulmonary nodules are indeterminate, potentially sequelae of prior infectious/inflammatory process. Recommend attention on follow-up. Consider follow-up CT in 6 months. 5. Portal venous gas is demonstrated within the left hepatic lobe. Additionally, there is wall thickening of the cecum and ascending colon with small amount of gas within the pericolonic vasculature. Constellation of findings is indeterminate in etiology however may be secondary to colitis at this location with considerations including infectious, inflammatory or ischemic etiologies. Correlate for recent procedure. If patient is not up-to-date for colonic screening, recommend further evaluation with colonoscopy to exclude the possibility of colonic mass within the cecum/ascending colon. 6. These results were called by telephone at the time of interpretation on 03/25/2018 at 9:31 am to Dr. Truitt Merle , who verbally acknowledged these results.   03/30/2018 - 08/11/2018 Chemotherapy   Neoadjuvant TCHP every 3 weeks starting 03/30/18. Completed 6 cycles of TCHP on 07/23/18.  Continued with maintenance Herceptin/Perjeta q3weeks to complete 1 year.    04/09/2018 Genetic Testing   Negative genetic testing on the [employer] Common Hereditary Cancers Panel. The Common Hereditary Cancers Panel offered by [facility] includes sequencing and/or deletion duplication testing of the following 47 genes: APC, ATM, AXIN2, BARD1, BMPR1A, BRCA1, BRCA2, BRIP1, CDH1, CDKN2A (p14ARF), CDKN2A (p16INK4a), CKD4, CHEK2, CTNNA1, DICER1, EPCAM (Deletion/duplication testing only), GREM1 (promoter region  deletion/duplication testing only), KIT, MEN1, MLH1, MSH2, MSH3, MSH6, MUTYH, NBN, NF1, NHTL1, PALB2, PDGFRA, PMS2, POLD1, POLE, PTEN, RAD50, RAD51C, RAD51D, SDHB, SDHC, SDHD, SMAD4, SMARCA4. STK11, TP53, TSC1, TSC2, and VHL.  The following genes were evaluated for sequence changes only: SDHA and HOXB13 c.251G>A variant only.   Genetic testing did detect a Variant of Unknown Significance (VUS) in the POLD1 gene called c.985C>T (p.Pro329Ser). At this time, it is unknown if this variant is associated with increased cancer risk or if this is a normal finding, but most variants such as this get reclassified to being inconsequential. It should not be used to make medical management decisions.   The report date is 04/09/2018.   07/24/2018 Breast MRI   IMPRESSION: 1. Resolution of the enhancing mass (known cancer) and small satellite lesions previously seen in the right breast following chemotherapy.  2. The metastatic lymph node in the right axilla has decreased in size.  3.  No MRI evidence of left breast malignancy.   09/14/2018 Surgery   RIGHT NIPPLE SPARING MASTECTOMY WITH TARGETED DEEP RIGHT AXILLARY LYMPH NODE BIOPSY WITH RADIOACTIVE SEED LOCALIZATION AND RIGHT AXILLARY SENTINEL LYMPH NODE BIOPSY by Dr. Dalbert Batman  09/14/18    09/14/2018 Pathology Results   Diagnosis 1. Lymph node, sentinel, biopsy, right axillary with radioactive seed - ONE OF ONE LYMPH NODES NEGATIVE FOR CARCINOMA (0/1). - MILD TREATMENT EFFECT. - BIOPSY SITE. 2. Lymph node, sentinel, biopsy, right axillary - ONE OF ONE LYMPH NODES NEGATIVE FOR CARCINOMA (0/1). 3. Lymph node, sentinel, biopsy, right - ONE OF ONE LYMPH NODES NEGATIVE FOR CARCINOMA (0/1). 4. Lymph node, sentinel, biopsy, right - ONE OF ONE LYMPH NODES NEGATIVE FOR CARCINOMA (0/1). 5. Lymph node, sentinel, biopsy, right - ONE OF  ONE LYMPH NODES NEGATIVE FOR CARCINOMA (0/1). 6. Lymph node, sentinel, biopsy, right - ONE OF ONE LYMPH NODES NEGATIVE FOR  CARCINOMA (0/1). 7. Lymph node, sentinel, biopsy, right - ONE OF ONE LYMPH NODES NEGATIVE FOR CARCINOMA (0/1). 8. Lymph node, sentinel, biopsy, right - ONE OF ONE LYMPH NODES NEGATIVE FOR CARCINOMA (0/1). 9. Lymph node, sentinel, biopsy, right - ONE OF ONE LYMPH NODES NEGATIVE FOR CARCINOMA (0/1). 10. Lymph node, sentinel, biopsy, right - ONE OF ONE LYMPH NODES NEGATIVE FOR CARCINOMA (0/1). 11. Lymph node, sentinel, biopsy, right - ONE OF ONE LYMPH NODES NEGATIVE FOR CARCINOMA (0/1). 12. Lymph node, sentinel, biopsy, right - ONE OF ONE LYMPH NODES NEGATIVE FOR CARCINOMA (0/1). 13. Nipple Biopsy, right - BENIGN NIPPLE TISSUE. 14. Breast, simple mastectomy, right - NO RESIDUAL CARCINOMA IDENTIFIED. - TREATMENT EFFECT. - FIBROCYSTIC CHANGE. - ONE OF ONE LYMPH NODES NEGATIVE FOR CARCINOMA (0/1). - SEE ONCOLOGY TABLE.   09/14/2018 Cancer Staging   Staging form: Breast, AJCC 8th Edition - Pathologic stage from 09/14/2018: No Stage Recommended (ypT0, pN0, cM0, GX, ER: Not Assessed, PR: Not Assessed, HER2: Not Assessed) - Signed by Truitt Merle, MD on 09/23/2018   11/03/2018 - 12/10/2018 Radiation Therapy   Adjuvant Radiation with Dr. Isidore Moos    12/27/2018 -  Anti-estrogen oral therapy   Tamoxifen '20mg'$  daily starting 12/27/18. Start Tamoxifen '20mg'$  daily in 12/27/2018 stopped after 1 week due to poor toleration.    01/04/2019 Imaging   CT chest  IMPRESSION: 1. Stable tiny subpleural and perifissural nodules bilaterally. No suspicious pulmonary nodule or mass. 2. No focal airspace disease in the right lung. 3.  Aortic Atherosclerois (ICD10-170.0)     03/08/2019 Imaging   CT AP W Contrast 03/08/19  IMPRESSION: 1. No metastatic disease in the abdomen pelvis. 2. No evidence adverse immunotherapy therapy reaction.   03/08/2019 Imaging   Whole body bone scan 03/08/19  IMPRESSION: No definite scintigraphic evidence of osseous metastases.      CURRENT THERAPY:  -Maintenance Herceptin/Perjeta q3weeks  to complete 1 year treatmentin9/2020 -Start Tamoxifen '20mg'$ daily in 6/1/2020stopped after 1 week due to poor toleration.She started anastrozole on 02/04/19 but stopped on 02/14/19 due to worsening muscle aches.   INTERVAL HISTORY:  Claire Guzman is here for a follow up and treatment. She presents to the clinic alone. She is overall doing well.  She has recovered from previous episode of UTI, her back pain and muscle spasm has resolved.  Her appetite, energy level has also returned to normal.  She denies any other new symptoms.  REVIEW OF SYSTEMS:   Constitutional: Denies fevers, chills or abnormal weight loss, mild fatigue  Eyes: Denies blurriness of vision Ears, nose, mouth, throat, and face: Denies mucositis or sore throat Respiratory: Denies cough, dyspnea or wheezes Cardiovascular: Denies palpitation, chest discomfort or lower extremity swelling Gastrointestinal:  Denies nausea, heartburn or change in bowel habits Skin: Denies abnormal skin rashes Lymphatics: Denies new lymphadenopathy or easy bruising Neurological:Denies numbness, tingling or new weaknesses Behavioral/Psych: Mood is stable, no new changes  All other systems were reviewed with the patient and are negative.  MEDICAL HISTORY:  Past Medical History:  Diagnosis Date  . Abdominal pain, unspecified site   . Anxiety state, unspecified   . Complication of anesthesia    convulsions - when she has appendectomy about 12 years ago at [facility]  . Contact dermatitis and other eczema, due to unspecified cause   . Essential hypertension, benign   . Family history of colon cancer   .  Family history of melanoma   . Family history of prostate cancer   . GERD (gastroesophageal reflux disease)   . Hematuria, unspecified   . Hypertension   . Lower back pain   . Mixed hyperlipidemia   . Rosacea   . rt breast ca dx'd 01/2018   R breast cancer  . Unspecified symptom associated with female genital organs     SURGICAL  HISTORY: Past Surgical History:  Procedure Laterality Date  . BREAST RECONSTRUCTION WITH PLACEMENT OF TISSUE EXPANDER AND ALLODERM Right 09/14/2018   Procedure: BREAST RECONSTRUCTION WITH PLACEMENT OF TISSUE EXPANDER AND ALLODERM;  Surgeon: Irene Limbo, MD;  Location: [place];  Service: Plastics;  Laterality: Right;  . CESAREAN SECTION    . DIAGNOSTIC LAPAROSCOPY    . EYE SURGERY     lasix  . LAPAROSCOPIC APPENDECTOMY    . MASTECTOMY WITH RADIOACTIVE SEED GUIDED EXCISION AND AXILLARY SENTINEL LYMPH NODE BIOPSY Right 09/14/2018   Procedure: RIGHT NIPPLE SPARING MASTECTOMY WITH TARGETED DEEP RIGHT AXILLARY LYMPH NODE BIOPSY WITH RADIOACTIVE SEED LOCALIZATION AND RIGHT AXILLARY SENTINEL LYMPH NODE BIOPSY, FROZEN SECTION AND POSSIBLE COMPLETION AXILLARY LYMPH NODE DISSECTION INJECT BLUE DYE RIGHT BREAST;  Surgeon: Fanny Skates, MD;  Location: [place];  Service: General;  Laterality: Right;  . PORTACATH PLACEMENT Left 04/13/2018   Procedure: INSERTION PORT-A-CATH WITH [place];  Surgeon: Fanny Skates, MD;  Location: [place];  Service: General;  Laterality: Left;  . UTERINE FIBROID SURGERY      I have reviewed the social history and family history with the patient and they are unchanged from previous note.  ALLERGIES:  is allergic to metrogel [metronidazole]; other; paxil [paroxetine hcl]; prednisone; and zomig [zolmitriptan].  MEDICATIONS:  Current Outpatient Medications  Medication Sig Dispense Refill  . ALPRAZolam (XANAX) 0.5 MG tablet TAKE 1 TABLET(0.5 MG) BY MOUTH AT BEDTIME AS NEEDED FOR ANXIETY 30 tablet 0  . amoxicillin-clavulanate (AUGMENTIN) 875-125 MG tablet Take 1 tablet by mouth 2 (two) times daily. 10 tablet 0  . anastrozole (ARIMIDEX) 1 MG tablet Take 1 tablet (1 mg total) by mouth daily. 30 tablet 2  . atenolol (TENORMIN) 50 MG tablet Take 50 mg by mouth at bedtime.    . cyclobenzaprine (FLEXERIL) 5 MG tablet Take 1 tablet (5 mg total) by mouth 3 (three) times  daily as needed for muscle spasms. 30 tablet 0  . exemestane (AROMASIN) 25 MG tablet Take 1 tablet (25 mg total) by mouth daily after breakfast. 30 tablet 3  . gabapentin (NEURONTIN) 300 MG capsule Take 1 capsule (300 mg total) by mouth 3 (three) times daily. 45 capsule 1  . HYDROcodone-acetaminophen (NORCO) 5-325 MG tablet Take 1-2 tablets by mouth every 4 (four) hours as needed for moderate pain or severe pain. 40 tablet 0  . omeprazole (PRILOSEC) 20 MG capsule Take 1 capsule (20 mg total) by mouth 2 (two) times daily before a meal. 60 capsule 2  . potassium chloride (KLOR-CON) 20 MEQ packet Take 20 mEq by mouth 2 (two) times daily. Twice daily for 7 days then once daily 30 packet 1  . potassium chloride SA (K-DUR) 20 MEQ tablet Take one tab dialy 30 tablet 3  . tamoxifen (NOLVADEX) 20 MG tablet Take 1 tablet (20 mg total) by mouth daily. 30 tablet 2  . traMADol (ULTRAM) 50 MG tablet Take 1 tablet BID PRN pain 30 tablet 0  . venlafaxine (EFFEXOR) 37.5 MG tablet Take 1 tablet (37.5 mg total) by mouth 2 (two) times daily.  30 tablet 3   No current facility-administered medications for this visit.     PHYSICAL EXAMINATION: ECOG PERFORMANCE STATUS: 0 - Asymptomatic Blood pressure 127/70, heart rate 75, respiratory to 16, temperature 36.8, pulse ox 100% on room air GENERAL:alert, no distress and comfortable SKIN: skin color, texture, turgor are normal, no rashes or significant lesions EYES: normal, Conjunctiva are pink and non-injected, sclera clear NECK: supple, thyroid normal size, non-tender, without nodularity LYMPH:  no palpable lymphadenopathy in the cervical, axillary  LUNGS: clear to auscultation and percussion with normal breathing effort HEART: regular rate & rhythm and no murmurs and no lower extremity edema ABDOMEN:abdomen soft, non-tender and normal bowel sounds Musculoskeletal:no cyanosis of digits and no clubbing  NEURO: alert & oriented x 3 with fluent speech, no focal  motor/sensory deficits  LABORATORY DATA:  I have reviewed the data as listed CBC Latest Ref Rng & Units 03/18/2019 02/25/2019 02/18/2019  WBC 4.0 - 10.5 K/uL 4.7 5.2 5.9  Hemoglobin 12.0 - 15.0 g/dL 11.5(L) 10.2(L) 10.1(L)  Hematocrit 36.0 - 46.0 % 32.5(L) 29.0(L) 28.6(L)  Platelets 150 - 400 K/uL 119(L) 134(L) 118(L)     CMP Latest Ref Rng & Units 03/18/2019 02/25/2019 02/18/2019  Glucose 70 - 99 mg/dL 106(H) 99 97  BUN 6 - 20 mg/dL '11 9 10  '$ Creatinine 0.44 - 1.00 mg/dL 0.69 0.65 0.54  Sodium 135 - 145 mmol/L 141 141 137  Potassium 3.5 - 5.1 mmol/L 3.9 3.9 3.3(L)  Chloride 98 - 111 mmol/L 107 108 102  CO2 22 - 32 mmol/L '24 24 25  '$ Calcium 8.9 - 10.3 mg/dL 9.0 8.8(L) 8.5(L)  Total Protein 6.5 - 8.1 g/dL 7.4 7.0 6.6  Total Bilirubin 0.3 - 1.2 mg/dL 0.5 0.4 0.8  Alkaline Phos 38 - 126 U/L 159(H) 160(H) 128(H)  AST 15 - 41 U/L 26 33 62(H)  ALT 0 - 44 U/L 19 28 47(H)      RADIOGRAPHIC STUDIES: I have personally reviewed the radiological images as listed and agreed with the findings in the report. No results found.   ASSESSMENT & PLAN:  KYRIANNA BARLETTA is a 53 y.o. female with   1.Malignant neoplasm of upper-inner quadrant of right breast in female, invasive ductal carcinoma, cT2N1M0 stage IB, ER+/PR+/HER2+, G3, ypT0N0 -She was diagnosed in 02/2018. Given the size of her mass and LN involvement, predicts highrisk of recurrence.  -She is s/pneoadjuvantchemoof 6 cyclesTCHP,right breast mastectomyandaxillary lymph node dissectionon 2/18/20and adjuvant radiation. -I discussedthat complete pathological response predict good outcome, her risk of recurrence is small. -Sheis currently on maintenanceHerceptinand Perjeta(due to node positive disease at diagnosis)to complete 1 year of treatment in9/2020. She is tolerating well with mild diarrhea. -She plans to contact Dr. Iran Planas to schedule her reconstruction surgery.  -To reduce her riskof distant breast cancer, I  started her on anti-estrogen therapy with Tamoxifen in 12/2018. She experienced heart palpitations, taste change and worsened neuropathy and hot flashes. She stopped on 01/03/19.  -I started her on Anastrozole on 7/10 but stopped on 7/20 due to her worsening body aches and recent UTI -After recent UTI, she had persistent muscle spasms and epigastric and RLQ tenderness on exam. Her 03/08/19 CT AP and bone scan were negetive for metastasis.  -She has recovered well, clinically asymptomatic.  I discussed other antiestrogen treatment options, and recommend her to try exemestane.  Potential benefit and side effects discussed with her again, she agrees to try.  Prescription called into her pharmacy today, she will start in the next few  days. -Lab reviewed, adequate for treatment, will proceed Herceptin Perjeta today, she has a 1 more dose, and will complete 1 year therapy. -I discussed the oral HER-2 antibody neratinib, the potential benefit of reducing recurrence, especially in ER positive and node positive disease, and potential side effects, especially diarrhea and its management.  She is interested.  I given her the reading material, plan to start after next visit  -f/u in 2 months    2. Geneticsshowed no pathogenic mutations.   3. Anxiety  -She has history of anxiety, currently on Xanax as needed -Stable, she has been stressed with her family andunemploymentlately. -On Effexor 37.'5mg'$  daily.  4. HTN -She will continue atenolol. -BP is well controlled  5. Bilateral small lung nodules -Her staging CT scan showed bilateral multiple small lung nodules, 3 to 4 mm, indeterminate, likely benign. She is a never smoker -CT chest from 01/04/19 showsstable tiny b/l nodules, noconcerns for malignancy. 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    Plan: -Lab reviewed, adequate for treatment, will proceed Herceptin  and Perjeta today, she will return in 3 weeks for last dose. -Lab, flush and follow-up in 2 months, plan to start neratinib after next visit.   No problem-specific Assessment & Plan notes found for this encounter.   No orders of the defined types were placed in this encounter.  All questions were answered. The patient knows to call the clinic with any problems, questions or concerns. No barriers to learning was detected. I spent 20 minutes counseling the patient face to face. The total time spent in the appointment was 25 minutes and more than 50% was on counseling and review of test results     Truitt Merle, MD 03/18/2019   I, Joslyn Devon, am acting as scribe for Truitt Merle, MD.   I have reviewed the above documentation for accuracy and completeness, and I agree with the above.

## 2019-03-18 ENCOUNTER — Inpatient Hospital Stay: Payer: BC Managed Care – PPO

## 2019-03-18 ENCOUNTER — Encounter: Payer: Self-pay | Admitting: Hematology

## 2019-03-18 ENCOUNTER — Other Ambulatory Visit: Payer: Self-pay

## 2019-03-18 ENCOUNTER — Ambulatory Visit: Payer: Self-pay

## 2019-03-18 ENCOUNTER — Inpatient Hospital Stay: Payer: BC Managed Care – PPO | Attending: Hematology | Admitting: Hematology

## 2019-03-18 ENCOUNTER — Other Ambulatory Visit: Payer: BC Managed Care – PPO

## 2019-03-18 VITALS — BP 127/70 | HR 75 | Temp 98.2°F | Resp 16

## 2019-03-18 DIAGNOSIS — Z170 Estrogen receptor positive status [ER+]: Secondary | ICD-10-CM | POA: Diagnosis not present

## 2019-03-18 DIAGNOSIS — C773 Secondary and unspecified malignant neoplasm of axilla and upper limb lymph nodes: Secondary | ICD-10-CM | POA: Insufficient documentation

## 2019-03-18 DIAGNOSIS — C50211 Malignant neoplasm of upper-inner quadrant of right female breast: Secondary | ICD-10-CM

## 2019-03-18 DIAGNOSIS — Z95828 Presence of other vascular implants and grafts: Secondary | ICD-10-CM

## 2019-03-18 DIAGNOSIS — Z5112 Encounter for antineoplastic immunotherapy: Secondary | ICD-10-CM | POA: Insufficient documentation

## 2019-03-18 LAB — CMP (CANCER CENTER ONLY)
ALT: 19 U/L (ref 0–44)
AST: 26 U/L (ref 15–41)
Albumin: 4 g/dL (ref 3.5–5.0)
Alkaline Phosphatase: 159 U/L — ABNORMAL HIGH (ref 38–126)
Anion gap: 10 (ref 5–15)
BUN: 11 mg/dL (ref 6–20)
CO2: 24 mmol/L (ref 22–32)
Calcium: 9 mg/dL (ref 8.9–10.3)
Chloride: 107 mmol/L (ref 98–111)
Creatinine: 0.69 mg/dL (ref 0.44–1.00)
GFR, Est AFR Am: >60 mL/min (ref >60)
GFR, Estimated: >60 mL/min (ref >60)
Glucose, Bld: 106 mg/dL — ABNORMAL HIGH (ref 70–99)
Potassium: 3.9 mmol/L (ref 3.5–5.1)
Sodium: 141 mmol/L (ref 135–145)
Total Bilirubin: 0.5 mg/dL (ref 0.3–1.2)
Total Protein: 7.4 g/dL (ref 6.5–8.1)

## 2019-03-18 LAB — CBC WITH DIFFERENTIAL (CANCER CENTER ONLY)
Abs Immature Granulocytes: 0.01 10*3/uL (ref 0.00–0.07)
Basophils Absolute: 0 10*3/uL (ref 0.0–0.1)
Basophils Relative: 0 %
Eosinophils Absolute: 0.2 10*3/uL (ref 0.0–0.5)
Eosinophils Relative: 3 %
HCT: 32.5 % — ABNORMAL LOW (ref 36.0–46.0)
Hemoglobin: 11.5 g/dL — ABNORMAL LOW (ref 12.0–15.0)
Immature Granulocytes: 0 %
Lymphocytes Relative: 48 %
Lymphs Abs: 2.2 10*3/uL (ref 0.7–4.0)
MCH: 32.6 pg (ref 26.0–34.0)
MCHC: 35.4 g/dL (ref 30.0–36.0)
MCV: 92.1 fL (ref 80.0–100.0)
Monocytes Absolute: 0.4 10*3/uL (ref 0.1–1.0)
Monocytes Relative: 8 %
Neutro Abs: 1.9 10*3/uL (ref 1.7–7.7)
Neutrophils Relative %: 41 %
Platelet Count: 119 10*3/uL — ABNORMAL LOW (ref 150–400)
RBC: 3.53 MIL/uL — ABNORMAL LOW (ref 3.87–5.11)
RDW: 13 % (ref 11.5–15.5)
WBC Count: 4.7 10*3/uL (ref 4.0–10.5)
nRBC: 0 % (ref 0.0–0.2)

## 2019-03-18 MED ORDER — ACETAMINOPHEN 325 MG PO TABS
ORAL_TABLET | ORAL | Status: AC
  Filled 2019-03-18: qty 2

## 2019-03-18 MED ORDER — ACETAMINOPHEN 325 MG PO TABS
650.0000 mg | ORAL_TABLET | Freq: Once | ORAL | Status: AC
  Administered 2019-03-18: 650 mg via ORAL

## 2019-03-18 MED ORDER — DIPHENHYDRAMINE HCL 25 MG PO CAPS
50.0000 mg | ORAL_CAPSULE | Freq: Once | ORAL | Status: AC
  Administered 2019-03-18: 50 mg via ORAL

## 2019-03-18 MED ORDER — SODIUM CHLORIDE 0.9% FLUSH
10.0000 mL | INTRAVENOUS | Status: DC | PRN
  Administered 2019-03-18: 10 mL
  Filled 2019-03-18: qty 10

## 2019-03-18 MED ORDER — DIPHENHYDRAMINE HCL 25 MG PO CAPS
ORAL_CAPSULE | ORAL | Status: AC
  Filled 2019-03-18: qty 2

## 2019-03-18 MED ORDER — SODIUM CHLORIDE 0.9 % IV SOLN
Freq: Once | INTRAVENOUS | Status: AC
  Administered 2019-03-18: 13:00:00 via INTRAVENOUS
  Filled 2019-03-18: qty 250

## 2019-03-18 MED ORDER — SODIUM CHLORIDE 0.9 % IV SOLN
420.0000 mg | Freq: Once | INTRAVENOUS | Status: AC
  Administered 2019-03-18: 14:00:00 420 mg via INTRAVENOUS
  Filled 2019-03-18: qty 14

## 2019-03-18 MED ORDER — HEPARIN SOD (PORK) LOCK FLUSH 100 UNIT/ML IV SOLN
500.0000 [IU] | Freq: Once | INTRAVENOUS | Status: AC | PRN
  Administered 2019-03-18: 500 [IU]
  Filled 2019-03-18: qty 5

## 2019-03-18 MED ORDER — TRASTUZUMAB CHEMO 150 MG IV SOLR
450.0000 mg | Freq: Once | INTRAVENOUS | Status: AC
  Administered 2019-03-18: 450 mg via INTRAVENOUS
  Filled 2019-03-18: qty 21.43

## 2019-03-18 MED ORDER — EXEMESTANE 25 MG PO TABS: 25.0000 mg | ORAL_TABLET | Freq: Every day | ORAL | 3 refills | Status: DC

## 2019-03-18 NOTE — Patient Instructions (Signed)
[facility] Discharge Instructions for Patients Receiving Chemotherapy  Today you received the following chemotherapy agents Trastuzumab (HERCEPTIN) & Pertuzumab (PERJETA).  To help prevent nausea and vomiting after your treatment, we encourage you to take your nausea medication as prescribed.   If you develop nausea and vomiting that is not controlled by your nausea medication, call the clinic.   BELOW ARE SYMPTOMS THAT SHOULD BE REPORTED IMMEDIATELY:  *FEVER GREATER THAN 100.5 F  *CHILLS WITH OR WITHOUT FEVER  NAUSEA AND VOMITING THAT IS NOT CONTROLLED WITH YOUR NAUSEA MEDICATION  *UNUSUAL SHORTNESS OF BREATH  *UNUSUAL BRUISING OR BLEEDING  TENDERNESS IN MOUTH AND THROAT WITH OR WITHOUT PRESENCE OF ULCERS  *URINARY PROBLEMS  *BOWEL PROBLEMS  UNUSUAL RASH Items with * indicate a potential emergency and should be followed up as soon as possible.  Feel free to call the clinic should you have any questions or concerns. The clinic phone number is [phone].  Please show the CHEMO ALERT CARD at check-in to the Emergency Department and triage nurse.   

## 2019-03-18 NOTE — Patient Instructions (Signed)

## 2019-03-19 LAB — CANCER ANTIGEN 27.29: CA 27.29: 39.3 U/mL — ABNORMAL HIGH (ref 0.0–38.6)

## 2019-03-20 ENCOUNTER — Encounter: Payer: Self-pay | Admitting: Hematology

## 2019-03-21 ENCOUNTER — Telehealth: Payer: Self-pay | Admitting: Hematology

## 2019-03-21 NOTE — Telephone Encounter (Signed)
Scheduled appt per 8/21 los.  Spoke with patient and they are aware of the appt date and time.

## 2019-03-21 NOTE — Progress Notes (Signed)
Pt refused to stay for 30 minute post observation period. VSS on discharge, pt educated on s/s of post-infusion reaction.

## 2019-03-28 ENCOUNTER — Other Ambulatory Visit: Payer: Self-pay | Admitting: Hematology

## 2019-03-28 ENCOUNTER — Telehealth: Payer: Self-pay

## 2019-03-28 MED ORDER — LETROZOLE 2.5 MG PO TABS: 2.5000 mg | ORAL_TABLET | Freq: Every day | ORAL | 0 refills | Status: DC

## 2019-03-28 NOTE — Telephone Encounter (Signed)
Patient calls stating she went to pick up exemestane and it was $200 even with her insurance coverage.  She started back taking anastrozole last Wednesday beginning on Friday started having left hip and lower back pain.  She has developed an head cold, which her daughter and husband both have.  She is wanting to know is there any other alternative medication.  Her [phone]

## 2019-03-28 NOTE — Telephone Encounter (Signed)
I called in letrozole to her pharmacy. Let her stop anastrozole, and try letrozole when she recovers from head cold. Meantime, please check with Vincent Peyer to see if we can get any financial assistance for Exemestane for her, thanks   Truitt Merle MD

## 2019-03-30 NOTE — Telephone Encounter (Signed)
Spoke with patient to let her know that Dr. Burr Medico has sent in letrozole to her pharmacy.  She would like her to stop the anastrozole and start the letrozole after her head cold has resolved.  Also there is financial assistance forms for exemestane.  When she comes in for her appointments on 9/11, we will get her to fill out her portion.  She verbalized an understanding.

## 2019-03-31 ENCOUNTER — Other Ambulatory Visit: Payer: Self-pay | Admitting: Hematology

## 2019-03-31 ENCOUNTER — Telehealth: Payer: Self-pay | Admitting: Hematology

## 2019-03-31 ENCOUNTER — Other Ambulatory Visit: Payer: Self-pay | Admitting: Nurse Practitioner

## 2019-03-31 DIAGNOSIS — Z170 Estrogen receptor positive status [ER+]: Secondary | ICD-10-CM

## 2019-03-31 DIAGNOSIS — C50211 Malignant neoplasm of upper-inner quadrant of right female breast: Secondary | ICD-10-CM

## 2019-03-31 MED ORDER — CYCLOBENZAPRINE HCL 5 MG PO TABS: 5.0000 mg | ORAL_TABLET | Freq: Three times a day (TID) | ORAL | 0 refills | Status: DC | PRN

## 2019-03-31 NOTE — Telephone Encounter (Signed)
Called patient regarding infusion log, informed patient 09/11 appointment times have been rescheduled. Patient is notified.

## 2019-04-06 DIAGNOSIS — Z923 Personal history of irradiation: Secondary | ICD-10-CM | POA: Diagnosis not present

## 2019-04-06 DIAGNOSIS — Z853 Personal history of malignant neoplasm of breast: Secondary | ICD-10-CM | POA: Diagnosis not present

## 2019-04-06 DIAGNOSIS — Z9011 Acquired absence of right breast and nipple: Secondary | ICD-10-CM | POA: Diagnosis not present

## 2019-04-07 DIAGNOSIS — Z1211 Encounter for screening for malignant neoplasm of colon: Secondary | ICD-10-CM | POA: Diagnosis not present

## 2019-04-08 ENCOUNTER — Inpatient Hospital Stay: Payer: BC Managed Care – PPO | Attending: Hematology

## 2019-04-08 ENCOUNTER — Inpatient Hospital Stay: Payer: BC Managed Care – PPO

## 2019-04-08 ENCOUNTER — Other Ambulatory Visit: Payer: BC Managed Care – PPO

## 2019-04-08 ENCOUNTER — Other Ambulatory Visit: Payer: Self-pay

## 2019-04-08 ENCOUNTER — Encounter: Payer: Self-pay | Admitting: *Deleted

## 2019-04-08 VITALS — BP 137/84 | HR 65 | Temp 98.5°F | Resp 16 | Wt 149.8 lb

## 2019-04-08 DIAGNOSIS — C50211 Malignant neoplasm of upper-inner quadrant of right female breast: Secondary | ICD-10-CM

## 2019-04-08 DIAGNOSIS — M79673 Pain in unspecified foot: Secondary | ICD-10-CM | POA: Diagnosis not present

## 2019-04-08 DIAGNOSIS — Z95828 Presence of other vascular implants and grafts: Secondary | ICD-10-CM

## 2019-04-08 DIAGNOSIS — R918 Other nonspecific abnormal finding of lung field: Secondary | ICD-10-CM | POA: Diagnosis not present

## 2019-04-08 DIAGNOSIS — G620 Drug-induced polyneuropathy: Secondary | ICD-10-CM | POA: Insufficient documentation

## 2019-04-08 DIAGNOSIS — C773 Secondary and unspecified malignant neoplasm of axilla and upper limb lymph nodes: Secondary | ICD-10-CM | POA: Insufficient documentation

## 2019-04-08 DIAGNOSIS — Z79899 Other long term (current) drug therapy: Secondary | ICD-10-CM | POA: Insufficient documentation

## 2019-04-08 DIAGNOSIS — I10 Essential (primary) hypertension: Secondary | ICD-10-CM | POA: Insufficient documentation

## 2019-04-08 DIAGNOSIS — Z923 Personal history of irradiation: Secondary | ICD-10-CM | POA: Diagnosis not present

## 2019-04-08 DIAGNOSIS — Z170 Estrogen receptor positive status [ER+]: Secondary | ICD-10-CM | POA: Insufficient documentation

## 2019-04-08 DIAGNOSIS — Z5112 Encounter for antineoplastic immunotherapy: Secondary | ICD-10-CM | POA: Insufficient documentation

## 2019-04-08 DIAGNOSIS — F419 Anxiety disorder, unspecified: Secondary | ICD-10-CM | POA: Insufficient documentation

## 2019-04-08 DIAGNOSIS — R21 Rash and other nonspecific skin eruption: Secondary | ICD-10-CM | POA: Insufficient documentation

## 2019-04-08 DIAGNOSIS — M7989 Other specified soft tissue disorders: Secondary | ICD-10-CM | POA: Insufficient documentation

## 2019-04-08 DIAGNOSIS — Z9011 Acquired absence of right breast and nipple: Secondary | ICD-10-CM | POA: Diagnosis not present

## 2019-04-08 DIAGNOSIS — T451X5A Adverse effect of antineoplastic and immunosuppressive drugs, initial encounter: Secondary | ICD-10-CM | POA: Diagnosis not present

## 2019-04-08 MED ORDER — DIPHENHYDRAMINE HCL 25 MG PO CAPS
50.0000 mg | ORAL_CAPSULE | Freq: Once | ORAL | Status: AC
  Administered 2019-04-08: 50 mg via ORAL

## 2019-04-08 MED ORDER — SODIUM CHLORIDE 0.9% FLUSH
10.0000 mL | INTRAVENOUS | Status: DC | PRN
  Administered 2019-04-08: 10 mL
  Filled 2019-04-08: qty 10

## 2019-04-08 MED ORDER — DIPHENHYDRAMINE HCL 25 MG PO CAPS
ORAL_CAPSULE | ORAL | Status: AC
  Filled 2019-04-08: qty 2

## 2019-04-08 MED ORDER — TRASTUZUMAB CHEMO 150 MG IV SOLR
450.0000 mg | Freq: Once | INTRAVENOUS | Status: AC
  Administered 2019-04-08: 450 mg via INTRAVENOUS
  Filled 2019-04-08: qty 21.43

## 2019-04-08 MED ORDER — ACETAMINOPHEN 325 MG PO TABS
650.0000 mg | ORAL_TABLET | Freq: Once | ORAL | Status: AC
  Administered 2019-04-08: 14:00:00 650 mg via ORAL

## 2019-04-08 MED ORDER — SODIUM CHLORIDE 0.9 % IV SOLN
Freq: Once | INTRAVENOUS | Status: AC
  Administered 2019-04-08: 14:00:00 via INTRAVENOUS
  Filled 2019-04-08: qty 250

## 2019-04-08 MED ORDER — HEPARIN SOD (PORK) LOCK FLUSH 100 UNIT/ML IV SOLN
500.0000 [IU] | Freq: Once | INTRAVENOUS | Status: AC | PRN
  Administered 2019-04-08: 17:00:00 500 [IU]
  Filled 2019-04-08: qty 5

## 2019-04-08 MED ORDER — ACETAMINOPHEN 325 MG PO TABS
ORAL_TABLET | ORAL | Status: AC
  Filled 2019-04-08: qty 2

## 2019-04-08 MED ORDER — SODIUM CHLORIDE 0.9 % IV SOLN
420.0000 mg | Freq: Once | INTRAVENOUS | Status: AC
  Administered 2019-04-08: 420 mg via INTRAVENOUS
  Filled 2019-04-08: qty 14

## 2019-04-08 NOTE — Patient Instructions (Signed)
[facility] Discharge Instructions for Patients Receiving Chemotherapy  Today you received the following chemotherapy agents Trastuzumab (HERCEPTIN) & Pertuzumab (PERJETA).  To help prevent nausea and vomiting after your treatment, we encourage you to take your nausea medication as prescribed.   If you develop nausea and vomiting that is not controlled by your nausea medication, call the clinic.   BELOW ARE SYMPTOMS THAT SHOULD BE REPORTED IMMEDIATELY:  *FEVER GREATER THAN 100.5 F  *CHILLS WITH OR WITHOUT FEVER  NAUSEA AND VOMITING THAT IS NOT CONTROLLED WITH YOUR NAUSEA MEDICATION  *UNUSUAL SHORTNESS OF BREATH  *UNUSUAL BRUISING OR BLEEDING  TENDERNESS IN MOUTH AND THROAT WITH OR WITHOUT PRESENCE OF ULCERS  *URINARY PROBLEMS  *BOWEL PROBLEMS  UNUSUAL RASH Items with * indicate a potential emergency and should be followed up as soon as possible.  Feel free to call the clinic should you have any questions or concerns. The clinic phone number is [phone].  Please show the CHEMO ALERT CARD at check-in to the Emergency Department and triage nurse.   

## 2019-04-09 ENCOUNTER — Encounter: Payer: Self-pay | Admitting: Hematology

## 2019-04-09 LAB — CANCER ANTIGEN 27.29: CA 27.29: 26.5 U/mL (ref 0.0–38.6)

## 2019-04-11 ENCOUNTER — Telehealth: Payer: Self-pay

## 2019-04-11 NOTE — Telephone Encounter (Signed)
Faxed membership application to [facility] for possible assistance for exemestane. Sent to HIM for scan to chart.   Faxed to [phone]

## 2019-04-19 ENCOUNTER — Telehealth: Payer: Self-pay | Admitting: Nurse Practitioner

## 2019-04-19 ENCOUNTER — Telehealth: Payer: Self-pay

## 2019-04-19 NOTE — Telephone Encounter (Signed)
-----   Message from Alla Feeling, NP sent at 04/19/2019  9:42 AM EDT ----- Regarding: RE: Neuropathy and Rash If he can see her today that would be fine, or you can add her to my schedule Wednesday or Thursday. Please have her stop letrozole for now. Can apply topical hydrocortisone or Benadryl until I see her.  Thanks, Lacie ----- Message ----- From: Zola Button, RN Sent: 04/19/2019   9:20 AM EDT To: Jonnie Finner, RN, Alla Feeling, NP, # Subject: Neuropathy and Rash                            Good morning,   Patient called that since she started letrazole (prescribed 03/28/19) her neuropapthy has worsened in her feet and her hands at night, and she has developed a rash on her chest. She would like to know if she should stop the letrazole or if this "is to be expected and I just deal with it". Lucianne Lei may be able to see her today if you want me to have her come in, or I can call her with your recommendations.   Thanks! Ife Vitelli

## 2019-04-19 NOTE — Telephone Encounter (Signed)
Returned patient's call to tell her that per Regan Rakers Burton-NP patient should stop taking the letrazole, and that she can apply a hydrocortisone or Benadryl cream to the rash on her chest. Informed her that if she would like to be seen by a provider today, Alfredia Client had room in his schedule. Otherwise, Regan Rakers can see her tomorrow or Thursday depending on the patient's preference. Patient stated she would prefer to see [facility] tomorrow. Told patient I would send a message to scheduling to add her to Lacie's schedule, and that someone from the scheduling department would call her with the official appointment time. Patient verbalized understanding and agreement, and repeated understanding to stop taking her letrazole until told otherwise. Before hanging up, patient mentioned she thinks she can "feel something in my left breast and I'm a bit worried". Informed her I would make Lacie aware and they could address it during her visit tomorrow. Patient verbalized agreement and denied any further needs at this time.   High priority scheduling message sent to add patient to [place] scheduled for 04/20/19

## 2019-04-19 NOTE — Telephone Encounter (Signed)
Scheduled appt per 9/22 sch message - pt aware of appt date and time   

## 2019-04-20 ENCOUNTER — Other Ambulatory Visit: Payer: Self-pay

## 2019-04-20 ENCOUNTER — Encounter: Payer: Self-pay | Admitting: Nurse Practitioner

## 2019-04-20 ENCOUNTER — Inpatient Hospital Stay: Payer: BC Managed Care – PPO | Admitting: Nurse Practitioner

## 2019-04-20 ENCOUNTER — Telehealth: Payer: Self-pay

## 2019-04-20 VITALS — BP 131/78 | HR 77 | Temp 98.2°F | Resp 18 | Ht 64.0 in | Wt 154.4 lb

## 2019-04-20 DIAGNOSIS — Z79899 Other long term (current) drug therapy: Secondary | ICD-10-CM | POA: Diagnosis not present

## 2019-04-20 DIAGNOSIS — C50211 Malignant neoplasm of upper-inner quadrant of right female breast: Secondary | ICD-10-CM | POA: Diagnosis not present

## 2019-04-20 DIAGNOSIS — Z923 Personal history of irradiation: Secondary | ICD-10-CM | POA: Diagnosis not present

## 2019-04-20 DIAGNOSIS — F419 Anxiety disorder, unspecified: Secondary | ICD-10-CM | POA: Diagnosis not present

## 2019-04-20 DIAGNOSIS — Z170 Estrogen receptor positive status [ER+]: Secondary | ICD-10-CM | POA: Diagnosis not present

## 2019-04-20 DIAGNOSIS — M79673 Pain in unspecified foot: Secondary | ICD-10-CM | POA: Diagnosis not present

## 2019-04-20 DIAGNOSIS — T451X5A Adverse effect of antineoplastic and immunosuppressive drugs, initial encounter: Secondary | ICD-10-CM | POA: Diagnosis not present

## 2019-04-20 DIAGNOSIS — C773 Secondary and unspecified malignant neoplasm of axilla and upper limb lymph nodes: Secondary | ICD-10-CM | POA: Diagnosis not present

## 2019-04-20 DIAGNOSIS — G620 Drug-induced polyneuropathy: Secondary | ICD-10-CM | POA: Diagnosis not present

## 2019-04-20 DIAGNOSIS — R918 Other nonspecific abnormal finding of lung field: Secondary | ICD-10-CM | POA: Diagnosis not present

## 2019-04-20 DIAGNOSIS — Z5112 Encounter for antineoplastic immunotherapy: Secondary | ICD-10-CM | POA: Diagnosis not present

## 2019-04-20 DIAGNOSIS — M7989 Other specified soft tissue disorders: Secondary | ICD-10-CM | POA: Diagnosis not present

## 2019-04-20 DIAGNOSIS — Z9011 Acquired absence of right breast and nipple: Secondary | ICD-10-CM | POA: Diagnosis not present

## 2019-04-20 DIAGNOSIS — I10 Essential (primary) hypertension: Secondary | ICD-10-CM | POA: Diagnosis not present

## 2019-04-20 DIAGNOSIS — R21 Rash and other nonspecific skin eruption: Secondary | ICD-10-CM | POA: Diagnosis not present

## 2019-04-20 MED ORDER — GABAPENTIN 300 MG PO CAPS: 300.0000 mg | ORAL_CAPSULE | Freq: Three times a day (TID) | ORAL | 3 refills | Status: DC

## 2019-04-20 NOTE — Addendum Note (Signed)
Addended by: Alla Feeling on: 04/20/2019 04:47 PM   Modules accepted: Orders

## 2019-04-20 NOTE — Telephone Encounter (Signed)
Faxed Urgent request to find out the status of the financial assistance application for assistance with exemestane to [employer] [facility].  Asked they replay to our fax number with an update.

## 2019-04-20 NOTE — Progress Notes (Signed)
[facility]   [phone] [phone]   Clinic Follow up Note   Patient Care Team: Lujean Amel, MD as PCP - General (Family Medicine) Fanny Skates, MD as Consulting Physician (General Surgery) Truitt Merle, MD as Consulting Physician (Hematology) Eppie Gibson, MD as Attending Physician (Radiation Oncology) Mauro Kaufmann, RN as Oncology Nurse Navigator Rockwell Germany, RN as Oncology Nurse Navigator 04/20/2019  CHIEF COMPLAINT: swelling, rash    SUMMARY OF ONCOLOGIC HISTORY: Oncology History Overview Note  Cancer Staging Malignant neoplasm of upper-inner quadrant of right breast in female, estrogen receptor positive ([place]) Staging form: Breast, AJCC 8th Edition - Clinical stage from 03/08/2018: Stage IB (cT2, cN1, cM0, G3, ER+, PR+, HER2+) - Signed by Truitt Merle, MD on 03/16/2018     Malignant neoplasm of upper-inner quadrant of right breast in female, estrogen receptor positive ([place])  02/24/2018 Mammogram   screening mammogram on 02/24/2018 that was benign   03/04/2018 Mammogram   diagnostic mammogram on 03/04/2018 due to a palpable mass on her right breast. Diagnostic mammogram, breast US revealed and biopsy revealed suspicious mass in the right breast at 2:30 at the palpable site of concern and one suspicious right axillary LN.   03/08/2018 Cancer Staging   Staging form: Breast, AJCC 8th Edition - Clinical stage from 03/08/2018: Stage IB (cT2, cN1, cM0, G3, ER+, PR+, HER2+) - Signed by Truitt Merle, MD on 03/16/2018   03/08/2018 Receptors her2   Her2 positive, ER 90% PR 5% and Ki67 60%   03/08/2018 Pathology Results   Diagnosis 1. Breast, right, needle core biopsy, 2:30 - INVASIVE DUCTAL CARCINOMA, GRADE 3. - LYMPHOVASCULAR SPACE INVOLVEMENT BY TUMOR. 2. Lymph node, needle/core biopsy, right axilla - METASTATIC CARCINOMA INVOLVING SCANT LYMPHOID TISSUE.   03/10/2018 Initial Diagnosis   Malignant neoplasm of upper-inner quadrant of right breast  in female, estrogen receptor positive ([place])   03/20/2018 Imaging   MRI brain 03/20/18 IMPRESSION: No evidence of metastatic disease.  Normal appearance of brain. Slightly heterogeneous marrow pattern of the upper cervical spine, nonspecific but likely benign, especially in the setting of early stage disease.    03/24/2018 Echocardiogram   Baseline ECHO 03/24/18 Study Conclusions - Left ventricle: The cavity size was normal. Wall thickness was   increased in a pattern of mild LVH. Systolic function was normal.   The estimated ejection fraction was in the range of 60% to 65%.   Wall motion was normal; there were no regional wall motion   abnormalities. Doppler parameters are consistent with abnormal   left ventricular relaxation (grade 1 diastolic dysfunction). - Right ventricle: The cavity size was mildly dilated. Wall   thickness was normal.   03/25/2018 Imaging   MRI Breast B/l 03/25/18 IMPRESSION: 1. The patient's primary malignancy in the posterior right breast measures 3.2 x 2 x 2.6 cm with apparent invasion of the underlying pectoralis muscle. Numerous surrounding abnormal enhancing masses, consistent with satellite lesions, extend from 11 o'clock in the right breast into the inferolateral right breast with a total span of suspected disease measuring 4.5 x 4.6 x 6.4 cm in AP, transverse, and craniocaudal dimension. The suspected extent of disease involves the superior and inferolateral quadrants. The primary malignancy also extends just across midline into the superior medial quadrant. 2. No MRI evidence of malignancy in the left breast. 3. Known metastatic noted in the right axilla.   03/25/2018 Imaging   Whole body bone scan 03/25/18 IMPRESSION: No scintigraphic evidence skeletal metastasis.   03/25/2018 Imaging  CT CAP W Contrast 03/25/18 IMPRESSION: 1. Mass within the deep aspect of the right breast along the pectoralis muscle compatible with primary breast  malignancy. 2. There is suggestion of two additional possible masses within the lateral aspect of the right breast versus nodular appearing breast tissue. Recommend dedicated evaluation with bilateral breast MRI. 3. Mildly thickened right axillary lymph node compatible with metastatic adenopathy, recently biopsied. 4. Multiple bilateral pulmonary nodules are indeterminate, potentially sequelae of prior infectious/inflammatory process. Recommend attention on follow-up. Consider follow-up CT in 6 months. 5. Portal venous gas is demonstrated within the left hepatic lobe. Additionally, there is wall thickening of the cecum and ascending colon with small amount of gas within the pericolonic vasculature. Constellation of findings is indeterminate in etiology however may be secondary to colitis at this location with considerations including infectious, inflammatory or ischemic etiologies. Correlate for recent procedure. If patient is not up-to-date for colonic screening, recommend further evaluation with colonoscopy to exclude the possibility of colonic mass within the cecum/ascending colon. 6. These results were called by telephone at the time of interpretation on 03/25/2018 at 9:31 am to Dr. Truitt Merle , who verbally acknowledged these results.   03/30/2018 - 08/11/2018 Chemotherapy   Neoadjuvant TCHP every 3 weeks starting 03/30/18. Completed 6 cycles of TCHP on 07/23/18.  Continued with maintenance Herceptin/Perjeta q3weeks to complete 1 year.    04/09/2018 Genetic Testing   Negative genetic testing on the [facility] Common Hereditary Cancers Panel. The Common Hereditary Cancers Panel offered by [facility] includes sequencing and/or deletion duplication testing of the following 47 genes: APC, ATM, AXIN2, BARD1, BMPR1A, BRCA1, BRCA2, BRIP1, CDH1, CDKN2A (p14ARF), CDKN2A (p16INK4a), CKD4, CHEK2, CTNNA1, DICER1, EPCAM (Deletion/duplication testing only), GREM1 (promoter region deletion/duplication testing  only), KIT, MEN1, MLH1, MSH2, MSH3, MSH6, MUTYH, NBN, NF1, NHTL1, PALB2, PDGFRA, PMS2, POLD1, POLE, PTEN, RAD50, RAD51C, RAD51D, SDHB, SDHC, SDHD, SMAD4, SMARCA4. STK11, TP53, TSC1, TSC2, and VHL.  The following genes were evaluated for sequence changes only: SDHA and HOXB13 c.251G>A variant only.   Genetic testing did detect a Variant of Unknown Significance (VUS) in the POLD1 gene called c.985C>T (p.Pro329Ser). At this time, it is unknown if this variant is associated with increased cancer risk or if this is a normal finding, but most variants such as this get reclassified to being inconsequential. It should not be used to make medical management decisions.   The report date is 04/09/2018.   07/24/2018 Breast MRI   IMPRESSION: 1. Resolution of the enhancing mass (known cancer) and small satellite lesions previously seen in the right breast following chemotherapy.  2. The metastatic lymph node in the right axilla has decreased in size.  3.  No MRI evidence of left breast malignancy.   09/14/2018 Surgery   RIGHT NIPPLE SPARING MASTECTOMY WITH TARGETED DEEP RIGHT AXILLARY LYMPH NODE BIOPSY WITH RADIOACTIVE SEED LOCALIZATION AND RIGHT AXILLARY SENTINEL LYMPH NODE BIOPSY by Dr. Dalbert Batman  09/14/18    09/14/2018 Pathology Results   Diagnosis 1. Lymph node, sentinel, biopsy, right axillary with radioactive seed - ONE OF ONE LYMPH NODES NEGATIVE FOR CARCINOMA (0/1). - MILD TREATMENT EFFECT. - BIOPSY SITE. 2. Lymph node, sentinel, biopsy, right axillary - ONE OF ONE LYMPH NODES NEGATIVE FOR CARCINOMA (0/1). 3. Lymph node, sentinel, biopsy, right - ONE OF ONE LYMPH NODES NEGATIVE FOR CARCINOMA (0/1). 4. Lymph node, sentinel, biopsy, right - ONE OF ONE LYMPH NODES NEGATIVE FOR CARCINOMA (0/1). 5. Lymph node, sentinel, biopsy, right - ONE OF ONE LYMPH NODES NEGATIVE FOR CARCINOMA (0/1).  6. Lymph node, sentinel, biopsy, right - ONE OF ONE LYMPH NODES NEGATIVE FOR CARCINOMA (0/1). 7. Lymph node,  sentinel, biopsy, right - ONE OF ONE LYMPH NODES NEGATIVE FOR CARCINOMA (0/1). 8. Lymph node, sentinel, biopsy, right - ONE OF ONE LYMPH NODES NEGATIVE FOR CARCINOMA (0/1). 9. Lymph node, sentinel, biopsy, right - ONE OF ONE LYMPH NODES NEGATIVE FOR CARCINOMA (0/1). 10. Lymph node, sentinel, biopsy, right - ONE OF ONE LYMPH NODES NEGATIVE FOR CARCINOMA (0/1). 11. Lymph node, sentinel, biopsy, right - ONE OF ONE LYMPH NODES NEGATIVE FOR CARCINOMA (0/1). 12. Lymph node, sentinel, biopsy, right - ONE OF ONE LYMPH NODES NEGATIVE FOR CARCINOMA (0/1). 13. Nipple Biopsy, right - BENIGN NIPPLE TISSUE. 14. Breast, simple mastectomy, right - NO RESIDUAL CARCINOMA IDENTIFIED. - TREATMENT EFFECT. - FIBROCYSTIC CHANGE. - ONE OF ONE LYMPH NODES NEGATIVE FOR CARCINOMA (0/1). - SEE ONCOLOGY TABLE.   09/14/2018 Cancer Staging   Staging form: Breast, AJCC 8th Edition - Pathologic stage from 09/14/2018: No Stage Recommended (ypT0, pN0, cM0, GX, ER: Not Assessed, PR: Not Assessed, HER2: Not Assessed) - Signed by Truitt Merle, MD on 09/23/2018   11/03/2018 - 12/10/2018 Radiation Therapy   Adjuvant Radiation with Dr. Isidore Moos    12/27/2018 -  Anti-estrogen oral therapy   Tamoxifen 70m daily starting 12/27/18. Start Tamoxifen 232mdaily in 12/27/2018 stopped after 1 week due to poor toleration.    01/04/2019 Imaging   CT chest  IMPRESSION: 1. Stable tiny subpleural and perifissural nodules bilaterally. No suspicious pulmonary nodule or mass. 2. No focal airspace disease in the right lung. 3.  Aortic Atherosclerois (ICD10-170.0)     03/08/2019 Imaging   CT AP W Contrast 03/08/19  IMPRESSION: 1. No metastatic disease in the abdomen pelvis. 2. No evidence adverse immunotherapy therapy reaction.   03/08/2019 Imaging   Whole body bone scan 03/08/19  IMPRESSION: No definite scintigraphic evidence of osseous metastases.     CURRENT THERAPY:  1. Maintenance Herceptin/Perjeta q3weeks to complete 1 year  treatmentin9/2020 2. Start Tamoxifen 2044mily in 6/1/2020stopped after 1 week due to poor toleration. 3. She started anastrozole on 02/04/19 but stopped on 02/14/19 due to worsening muscle aches. 4. Letrozole, starting 04/11/19  INTERVAL HISTORY: Ms. HedMunroeturns for unscheduled visit. She developed rash to her chest and "puffiness" in her hands, feet, and face 1.5 weeks after starting letrozole. She notes neuropathy has also gotten worse on letrozole, affecting her balance when putting on shoes, penmanship/typing, and her ability to do self breast exam. She thinks she feels a lump in left breast but can't be sure because of numbness. Hot flashes remain moderate. She is fatigued, but started a new job recently. She works part time at [employer] some difficulty retaining info. Her feet hurt once she stands up after prolonged sitting. Hot flashes remain moderate.    MEDICAL HISTORY:  Past Medical History:  Diagnosis Date   Abdominal pain, unspecified site    Anxiety state, unspecified    Complication of anesthesia    convulsions - when she has appendectomy about 12 years ago at [facility]   Contact dermatitis and other eczema, due to unspecified cause    Essential hypertension, benign    Family history of colon cancer    Family history of melanoma    Family history of prostate cancer    GERD (gastroesophageal reflux disease)    Hematuria, unspecified    Hypertension    Lower back pain    Mixed hyperlipidemia    Rosacea  rt breast ca dx'd 01/2018   R breast cancer   Unspecified symptom associated with female genital organs     SURGICAL HISTORY: Past Surgical History:  Procedure Laterality Date   BREAST RECONSTRUCTION WITH PLACEMENT OF TISSUE EXPANDER AND ALLODERM Right 09/14/2018   Procedure: BREAST RECONSTRUCTION WITH PLACEMENT OF TISSUE EXPANDER AND ALLODERM;  Surgeon: Irene Limbo, MD;  Location: [place];  Service: Plastics;  Laterality: Right;    CESAREAN SECTION     DIAGNOSTIC LAPAROSCOPY     EYE SURGERY     lasix   LAPAROSCOPIC APPENDECTOMY     MASTECTOMY WITH RADIOACTIVE SEED GUIDED EXCISION AND AXILLARY SENTINEL LYMPH NODE BIOPSY Right 09/14/2018   Procedure: RIGHT NIPPLE SPARING MASTECTOMY WITH TARGETED DEEP RIGHT AXILLARY LYMPH NODE BIOPSY WITH RADIOACTIVE SEED LOCALIZATION AND RIGHT AXILLARY SENTINEL LYMPH NODE BIOPSY, FROZEN SECTION AND POSSIBLE COMPLETION AXILLARY LYMPH NODE DISSECTION INJECT BLUE DYE RIGHT BREAST;  Surgeon: Fanny Skates, MD;  Location: [place];  Service: General;  Laterality: Right;   PORTACATH PLACEMENT Left 04/13/2018   Procedure: INSERTION PORT-A-CATH WITH [place];  Surgeon: Fanny Skates, MD;  Location: [place];  Service: General;  Laterality: Left;   UTERINE FIBROID SURGERY      I have reviewed the social history and family history with the patient and they are unchanged from previous note.  ALLERGIES:  is allergic to metrogel [metronidazole]; other; paxil [paroxetine hcl]; prednisone; and zomig [zolmitriptan].  MEDICATIONS:  Current Outpatient Medications  Medication Sig Dispense Refill   ALPRAZolam (XANAX) 0.5 MG tablet TAKE 1 TABLET(0.5 MG) BY MOUTH AT BEDTIME AS NEEDED FOR ANXIETY 30 tablet 0   atenolol (TENORMIN) 50 MG tablet Take 50 mg by mouth at bedtime.     cyclobenzaprine (FLEXERIL) 5 MG tablet Take 1 tablet (5 mg total) by mouth 3 (three) times daily as needed for muscle spasms. 30 tablet 0   exemestane (AROMASIN) 25 MG tablet Take 1 tablet (25 mg total) by mouth daily after breakfast. 30 tablet 3   gabapentin (NEURONTIN) 300 MG capsule Take 1 capsule (300 mg total) by mouth 3 (three) times daily. 90 capsule 3   HYDROcodone-acetaminophen (NORCO) 5-325 MG tablet Take 1-2 tablets by mouth every 4 (four) hours as needed for moderate pain or severe pain. 40 tablet 0   omeprazole (PRILOSEC) 20 MG capsule Take 1 capsule (20 mg total) by mouth 2 (two) times daily  before a meal. 60 capsule 2   potassium chloride (KLOR-CON) 20 MEQ packet Take 20 mEq by mouth 2 (two) times daily. Twice daily for 7 days then once daily 30 packet 1   potassium chloride SA (K-DUR) 20 MEQ tablet Take one tab dialy 30 tablet 3   traMADol (ULTRAM) 50 MG tablet Take 1 tablet BID PRN pain 30 tablet 0   venlafaxine (EFFEXOR) 37.5 MG tablet Take 1 tablet (37.5 mg total) by mouth 2 (two) times daily. 30 tablet 3   amoxicillin-clavulanate (AUGMENTIN) 875-125 MG tablet Take 1 tablet by mouth 2 (two) times daily. 10 tablet 0   letrozole (FEMARA) 2.5 MG tablet Take 1 tablet (2.5 mg total) by mouth daily. (Patient not taking: Reported on 04/20/2019) 30 tablet 0   No current facility-administered medications for this visit.     PHYSICAL EXAMINATION: ECOG PERFORMANCE STATUS: 1 - Symptomatic but completely ambulatory  Vitals:   04/20/19 1133  BP: 131/78  Pulse: 77  Resp: 18  Temp: 98.2 F (36.8 C)  SpO2: 100%   Filed Weights   04/20/19 1133  Weight: 154 lb 6.4 oz (70 kg)    GENERAL:alert, no distress and comfortable SKIN: mild scattered erythematous rash to chest EYES:  sclera clear LYMPH:  no palpable cervical or supraclavicular lymphadenopathy  LUNGS: respirations even and unlabored  Musculoskeletal: no significant edema NEURO: alert & oriented x 3 with fluent speech, normal gait Breast exam: s/p right mastectomy with implant, no mass along the chest wall. No palpable mass in left breast or either axilla that I could appreciate.   LABORATORY DATA:  I have reviewed the data as listed CBC Latest Ref Rng & Units 03/18/2019 02/25/2019 02/18/2019  WBC 4.0 - 10.5 K/uL 4.7 5.2 5.9  Hemoglobin 12.0 - 15.0 g/dL 11.5(L) 10.2(L) 10.1(L)  Hematocrit 36.0 - 46.0 % 32.5(L) 29.0(L) 28.6(L)  Platelets 150 - 400 K/uL 119(L) 134(L) 118(L)     CMP Latest Ref Rng & Units 03/18/2019 02/25/2019 02/18/2019  Glucose 70 - 99 mg/dL 106(H) 99 97  BUN 6 - 20 mg/dL _0 Creatinine 0.44 -  1.00 mg/dL 0.69 0.65 0.54  Sodium 135 - 145 mmol/L 141 141 137  Potassium 3.5 - 5.1 mmol/L 3.9 3.9 3.3(L)  Chloride 98 - 111 mmol/L 107 108 102  CO2 22 - 32 mmol/L _1 Calcium 8.9 - 10.3 mg/dL 9.0 8.8(L) 8.5(L)  Total Protein 6.5 - 8.1 g/dL 7.4 7.0 6.6  Total Bilirubin 0.3 - 1.2 mg/dL 0.5 0.4 0.8  Alkaline Phos 38 - 126 U/L 159(H) 160(H) 128(H)  AST 15 - 41 U/L 26 33 62(H)  ALT 0 - 44 U/L 19 28 47(H)      RADIOGRAPHIC STUDIES: I have personally reviewed the radiological images as listed and agreed with the findings in the report. No results found.   ASSESSMENT & PLAN: Claire Guzman is a 53 y.o. female with   1.Malignant neoplasm of upper-inner quadrant of right breast in female, invasive ductal carcinoma, cT2N1M0 stage IB, ER+/PR+/HER2+, G3, ypT0N0 -She was diagnosed in 02/2018. Given the size of her mass and LN involvement, predicts highrisk of recurrence.  -She is s/pneoadjuvantchemoof 6 cyclesTCHP,right breast mastectomyandaxillary lymph node dissectionon 2/18/20and adjuvant radiation. -Shecompleted maintenanceHerceptinand Perjeta(due to node positive disease at diagnosis)to complete 1 year of treatment in9/2020.  -Dr. Burr Medico previously discussed the oral HER-2 antibody neratinib, the potential benefit of reducing recurrence, especially in ER positive and node positive disease, and potential side effects, especially diarrhea and its management. She has not decided yet. She was given reading material -To reduce her riskof distant breast cancer, she started anti-estrogen therapy with Tamoxifen in 12/2018. She experienced heart palpitations, taste change and worsened neuropathy and hot flashes. She stopped on 01/03/19. -Started Anastrozole on 7/10 but stopped on 7/20 due to her worsening body aches and recent UTI -After recent UTI, she had persistent muscle spasms and epigastric and RLQ tenderness on exam. Her 03/08/19 CT AP and bone scan were negetive for  metastasis.  -She started letrozole due to high copay for [facility] on 04/11/19, she developed worsening neuropathy, foot pain and swelling, and mild skin rash. She stopped on 04/19/19   2. Geneticsshowed no pathogenic mutations.  3. Anxiety  -She has history of anxiety, currently on Xanax as needed -On Effexor 37.21m daily -stable  4. HTN -She will continue atenolol. -stable  5. Bilateral small lung nodules -Her staging CT scan showed bilateral multiple small lung nodules, 3 to 4 mm, indeterminate, likely benign. She is a never smoker -CT chest from 01/04/19 showsstable tiny b/l nodules, noconcerns for  malignancy.   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 She has difficulty doing her job, works at [employer], has trouble manipulating small pieces -She was previously prescribed gabapentin in 11/2018 but has not been taking it.  Disposition: Ms. Hook appears stable. She completed 1 year maintenance herceptin/perjeta on 04/08/19. She continues AI, but is not tolerating Letrozole very well, with increased neuropathy, foot pain and swelling, and mild skin rash. Letrozole on hold since 9/22. I encouraged her to continue topical steroid to chest. Will check on financial application for copay assistance for Exemestane. If approved, she can start in 2 weeks after symptoms improve from letrozole.   She has a new part time job at a [employer]. She gets very fatigued after a long day at work. Also has difficulty manipulating small pieces due to neuropathy. 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 I also recommend to restart gabapentin with 1 cap daily qHS, she can gradually increase to 1 cap TID if needed and she tolerates well. she agrees.    She is due annual left screening mammogram, I ordered for her today. Breast exam today is unremarkable. She will return for routine office visit with Dr. Burr Medico on 10/19. She will discuss  additional Her2 therapy with neratinib at that time. I briefly discussed dosing and potential side effects, she has not made a decision yet.    Orders Placed This Encounter  Procedures   MM Digital Screening Unilat L    Standing Status:   Future    Standing Expiration Date:   04/19/2020    Order Specific Question:   Reason for Exam (SYMPTOM  OR DIAGNOSIS REQUIRED)    Answer:   h/o right breast cancer s/p chemo, mastectomy, radiation, on AI    Order Specific Question:   Is the patient pregnant?    Answer:   No    Order Specific Question:   Preferred imaging location?    Answer:   [facility]   All questions were answered. The patient knows to call the clinic with any problems, questions or concerns. No barriers to learning was detected. I spent 20 minutes counseling the patient face to face. The total time spent in the appointment was 25 minutes and more than 50% was on counseling and review of test results     Alla Feeling, NP 04/20/19

## 2019-04-21 ENCOUNTER — Telehealth: Payer: Self-pay | Admitting: Nurse Practitioner

## 2019-04-21 NOTE — Telephone Encounter (Signed)
No los per 9/23.

## 2019-04-25 ENCOUNTER — Other Ambulatory Visit: Payer: Self-pay

## 2019-04-25 DIAGNOSIS — C50211 Malignant neoplasm of upper-inner quadrant of right female breast: Secondary | ICD-10-CM

## 2019-04-25 DIAGNOSIS — Z170 Estrogen receptor positive status [ER+]: Secondary | ICD-10-CM

## 2019-04-25 MED ORDER — EXEMESTANE 25 MG PO TABS: 25.0000 mg | ORAL_TABLET | Freq: Every day | ORAL | 3 refills | Status: DC

## 2019-04-30 ENCOUNTER — Other Ambulatory Visit: Payer: Self-pay | Admitting: Hematology

## 2019-05-05 ENCOUNTER — Telehealth: Payer: Self-pay

## 2019-05-05 NOTE — Telephone Encounter (Signed)
Received voicemail from [employer]) requesting a call back in regards to patient's Exemestane prescription, they were missing prescriber's ID number.   Called Roderic Palau back and was then transferred to [facility] who took down verbal order for prescription and Dr. Ernestina Penna DEA number. Call back number provided should there be any other issues or concerns.

## 2019-05-08 ENCOUNTER — Other Ambulatory Visit: Payer: Self-pay | Admitting: Hematology

## 2019-05-08 DIAGNOSIS — F419 Anxiety disorder, unspecified: Secondary | ICD-10-CM

## 2019-05-09 ENCOUNTER — Other Ambulatory Visit: Payer: Self-pay

## 2019-05-09 ENCOUNTER — Ambulatory Visit
Admission: RE | Admit: 2019-05-09 | Discharge: 2019-05-09 | Disposition: A | Discharge status: Home / Self Care | Payer: BC Managed Care – PPO | Source: Ambulatory Visit | Attending: Nurse Practitioner | Admitting: Nurse Practitioner

## 2019-05-09 DIAGNOSIS — Z170 Estrogen receptor positive status [ER+]: Secondary | ICD-10-CM

## 2019-05-09 DIAGNOSIS — C50211 Malignant neoplasm of upper-inner quadrant of right female breast: Secondary | ICD-10-CM

## 2019-05-10 ENCOUNTER — Other Ambulatory Visit: Payer: Self-pay | Admitting: Nurse Practitioner

## 2019-05-10 DIAGNOSIS — F419 Anxiety disorder, unspecified: Secondary | ICD-10-CM

## 2019-05-10 MED ORDER — ALPRAZOLAM 0.5 MG PO TABS: ORAL_TABLET | ORAL | 0 refills | Status: DC

## 2019-05-11 NOTE — Progress Notes (Signed)
[name]   [phone] Fax:([phone]) [phone]   Clinic Follow up Note   Patient Care Team: Lujean Amel, MD as PCP - General (Family Medicine) Fanny Skates, MD as Consulting Physician (General Surgery) Truitt Merle, MD as Consulting Physician (Hematology) Eppie Gibson, MD as Attending Physician (Radiation Oncology) Mauro Kaufmann, RN as Oncology Nurse Navigator Rockwell Germany, RN as Oncology Nurse Navigator  Date of Service:  05/16/2019  CHIEF COMPLAINT: F/u for right breast cancer  SUMMARY OF ONCOLOGIC HISTORY: Oncology History Overview Note  Cancer Staging Malignant neoplasm of upper-inner quadrant of right breast in female, estrogen receptor positive ([place]) Staging form: Breast, AJCC 8th Edition - Clinical stage from 03/08/2018: Stage IB (cT2, cN1, cM0, G3, ER+, PR+, HER2+) - Signed by Truitt Merle, MD on 03/16/2018     Malignant neoplasm of upper-inner quadrant of right breast in female, estrogen receptor positive ([place])  02/24/2018 Mammogram   screening mammogram on 02/24/2018 that was benign   03/04/2018 Mammogram   diagnostic mammogram on 03/04/2018 due to a palpable mass on her right breast. Diagnostic mammogram, breast US revealed and biopsy revealed suspicious mass in the right breast at 2:30 at the palpable site of concern and one suspicious right axillary LN.   03/08/2018 Cancer Staging   Staging form: Breast, AJCC 8th Edition - Clinical stage from 03/08/2018: Stage IB (cT2, cN1, cM0, G3, ER+, PR+, HER2+) - Signed by Truitt Merle, MD on 03/16/2018   03/08/2018 Receptors her2   Her2 positive, ER 90% PR 5% and Ki67 60%   03/08/2018 Pathology Results   Diagnosis 1. Breast, right, needle core biopsy, 2:30 - INVASIVE DUCTAL CARCINOMA, GRADE 3. - LYMPHOVASCULAR SPACE INVOLVEMENT BY TUMOR. 2. Lymph node, needle/core biopsy, right axilla - METASTATIC CARCINOMA INVOLVING SCANT LYMPHOID TISSUE.   03/10/2018 Initial Diagnosis   Malignant neoplasm of  upper-inner quadrant of right breast in female, estrogen receptor positive ([place])   03/20/2018 Imaging   MRI brain 03/20/18 IMPRESSION: No evidence of metastatic disease.  Normal appearance of brain. Slightly heterogeneous marrow pattern of the upper cervical spine, nonspecific but likely benign, especially in the setting of early stage disease.    03/24/2018 Echocardiogram   Baseline ECHO 03/24/18 Study Conclusions - Left ventricle: The cavity size was normal. Wall thickness was   increased in a pattern of mild LVH. Systolic function was normal.   The estimated ejection fraction was in the range of 60% to 65%.   Wall motion was normal; there were no regional wall motion   abnormalities. Doppler parameters are consistent with abnormal   left ventricular relaxation (grade 1 diastolic dysfunction). - Right ventricle: The cavity size was mildly dilated. Wall   thickness was normal.   03/25/2018 Imaging   MRI Breast B/l 03/25/18 IMPRESSION: 1. The patient's primary malignancy in the posterior right breast measures 3.2 x 2 x 2.6 cm with apparent invasion of the underlying pectoralis muscle. Numerous surrounding abnormal enhancing masses, consistent with satellite lesions, extend from 11 o'clock in the right breast into the inferolateral right breast with a total span of suspected disease measuring 4.5 x 4.6 x 6.4 cm in AP, transverse, and craniocaudal dimension. The suspected extent of disease involves the superior and inferolateral quadrants. The primary malignancy also extends just across midline into the superior medial quadrant. 2. No MRI evidence of malignancy in the left breast. 3. Known metastatic noted in the right axilla.   03/25/2018 Imaging   Whole body bone scan 03/25/18 IMPRESSION: No scintigraphic evidence skeletal  metastasis.   03/25/2018 Imaging   CT CAP W Contrast 03/25/18 IMPRESSION: 1. Mass within the deep aspect of the right breast along the pectoralis muscle  compatible with primary breast malignancy. 2. There is suggestion of two additional possible masses within the lateral aspect of the right breast versus nodular appearing breast tissue. Recommend dedicated evaluation with bilateral breast MRI. 3. Mildly thickened right axillary lymph node compatible with metastatic adenopathy, recently biopsied. 4. Multiple bilateral pulmonary nodules are indeterminate, potentially sequelae of prior infectious/inflammatory process. Recommend attention on follow-up. Consider follow-up CT in 6 months. 5. Portal venous gas is demonstrated within the left hepatic lobe. Additionally, there is wall thickening of the cecum and ascending colon with small amount of gas within the pericolonic vasculature. Constellation of findings is indeterminate in etiology however may be secondary to colitis at this location with considerations including infectious, inflammatory or ischemic etiologies. Correlate for recent procedure. If patient is not up-to-date for colonic screening, recommend further evaluation with colonoscopy to exclude the possibility of colonic mass within the cecum/ascending colon. 6. These results were called by telephone at the time of interpretation on 03/25/2018 at 9:31 am to Dr. Truitt Merle , who verbally acknowledged these results.   03/30/2018 - 08/11/2018 Chemotherapy   Neoadjuvant TCHP every 3 weeks starting 03/30/18. Completed 6 cycles of TCHP on 07/23/18.  Continued with maintenance Herceptin/Perjeta q3weeks to complete 1 year.    04/09/2018 Genetic Testing   Negative genetic testing on the [facility] Common Hereditary Cancers Panel. The Common Hereditary Cancers Panel offered by [facility] includes sequencing and/or deletion duplication testing of the following 47 genes: APC, ATM, AXIN2, BARD1, BMPR1A, BRCA1, BRCA2, BRIP1, CDH1, CDKN2A (p14ARF), CDKN2A (p16INK4a), CKD4, CHEK2, CTNNA1, DICER1, EPCAM (Deletion/duplication testing only), GREM1 (promoter region  deletion/duplication testing only), KIT, MEN1, MLH1, MSH2, MSH3, MSH6, MUTYH, NBN, NF1, NHTL1, PALB2, PDGFRA, PMS2, POLD1, POLE, PTEN, RAD50, RAD51C, RAD51D, SDHB, SDHC, SDHD, SMAD4, SMARCA4. STK11, TP53, TSC1, TSC2, and VHL.  The following genes were evaluated for sequence changes only: SDHA and HOXB13 c.251G>A variant only.   Genetic testing did detect a Variant of Unknown Significance (VUS) in the POLD1 gene called c.985C>T (p.Pro329Ser). At this time, it is unknown if this variant is associated with increased cancer risk or if this is a normal finding, but most variants such as this get reclassified to being inconsequential. It should not be used to make medical management decisions.   The report date is 04/09/2018.   07/24/2018 Breast MRI   IMPRESSION: 1. Resolution of the enhancing mass (known cancer) and small satellite lesions previously seen in the right breast following chemotherapy.  2. The metastatic lymph node in the right axilla has decreased in size.  3.  No MRI evidence of left breast malignancy.   09/14/2018 Surgery   RIGHT NIPPLE SPARING MASTECTOMY WITH TARGETED DEEP RIGHT AXILLARY LYMPH NODE BIOPSY WITH RADIOACTIVE SEED LOCALIZATION AND RIGHT AXILLARY SENTINEL LYMPH NODE BIOPSY by Dr. Dalbert Batman  09/14/18    09/14/2018 Pathology Results   Diagnosis 1. Lymph node, sentinel, biopsy, right axillary with radioactive seed - ONE OF ONE LYMPH NODES NEGATIVE FOR CARCINOMA (0/1). - MILD TREATMENT EFFECT. - BIOPSY SITE. 2. Lymph node, sentinel, biopsy, right axillary - ONE OF ONE LYMPH NODES NEGATIVE FOR CARCINOMA (0/1). 3. Lymph node, sentinel, biopsy, right - ONE OF ONE LYMPH NODES NEGATIVE FOR CARCINOMA (0/1). 4. Lymph node, sentinel, biopsy, right - ONE OF ONE LYMPH NODES NEGATIVE FOR CARCINOMA (0/1). 5. Lymph node, sentinel, biopsy, right - ONE OF  ONE LYMPH NODES NEGATIVE FOR CARCINOMA (0/1). 6. Lymph node, sentinel, biopsy, right - ONE OF ONE LYMPH NODES NEGATIVE FOR  CARCINOMA (0/1). 7. Lymph node, sentinel, biopsy, right - ONE OF ONE LYMPH NODES NEGATIVE FOR CARCINOMA (0/1). 8. Lymph node, sentinel, biopsy, right - ONE OF ONE LYMPH NODES NEGATIVE FOR CARCINOMA (0/1). 9. Lymph node, sentinel, biopsy, right - ONE OF ONE LYMPH NODES NEGATIVE FOR CARCINOMA (0/1). 10. Lymph node, sentinel, biopsy, right - ONE OF ONE LYMPH NODES NEGATIVE FOR CARCINOMA (0/1). 11. Lymph node, sentinel, biopsy, right - ONE OF ONE LYMPH NODES NEGATIVE FOR CARCINOMA (0/1). 12. Lymph node, sentinel, biopsy, right - ONE OF ONE LYMPH NODES NEGATIVE FOR CARCINOMA (0/1). 13. Nipple Biopsy, right - BENIGN NIPPLE TISSUE. 14. Breast, simple mastectomy, right - NO RESIDUAL CARCINOMA IDENTIFIED. - TREATMENT EFFECT. - FIBROCYSTIC CHANGE. - ONE OF ONE LYMPH NODES NEGATIVE FOR CARCINOMA (0/1). - SEE ONCOLOGY TABLE.   09/14/2018 Cancer Staging   Staging form: Breast, AJCC 8th Edition - Pathologic stage from 09/14/2018: No Stage Recommended (ypT0, pN0, cM0, GX, ER: Not Assessed, PR: Not Assessed, HER2: Not Assessed) - Signed by Truitt Merle, MD on 09/23/2018   11/03/2018 - 12/10/2018 Radiation Therapy   Adjuvant Radiation with Dr. Isidore Moos    12/27/2018 -  Anti-estrogen oral therapy   Tamoxifen 18m daily starting 12/27/18. Start Tamoxifen 269mdaily in 12/27/2018 stopped after 1 week due to poor toleration.    01/04/2019 Imaging   CT chest  IMPRESSION: 1. Stable tiny subpleural and perifissural nodules bilaterally. No suspicious pulmonary nodule or mass. 2. No focal airspace disease in the right lung. 3.  Aortic Atherosclerois (ICD10-170.0)     03/08/2019 Imaging   CT AP W Contrast 03/08/19  IMPRESSION: 1. No metastatic disease in the abdomen pelvis. 2. No evidence adverse immunotherapy therapy reaction.   03/08/2019 Imaging   Whole body bone scan 03/08/19  IMPRESSION: No definite scintigraphic evidence of osseous metastases.      CURRENT THERAPY:  -Maintenance Herceptin/Perjeta q3weeks  to complete 1 year treatmentin9/2020 -Start Tamoxifen 2072mily in 6/1/2020stopped after 1 week due to poor toleration.She started anastrozole on 02/04/19 but stopped on 02/14/19 due to worsening muscle aches.She tried Letrozole but did not tolerate. She was switched to Exemestane in late 04/2019.   INTERVAL HISTORY:  Claire Guzman here for a follow up and treatment. She presents to the clinic alone. She notes she has neuropathy of right hand more than has left. This is mainly with numbness. She does not drop things but her hand writing has decreased. She notes her fingernails have not really grown back but her hair has. She has found a part time job in a [employer] has tried Gabapentin but stopped due to drowsiness. She notes her back pain has resolved. She notes she has not gotten her Exemestane to start.    REVIEW OF SYSTEMS:   Constitutional: Denies fevers, chills or abnormal weight loss Eyes: Denies blurriness of vision Ears, nose, mouth, throat, and face: Denies mucositis or sore throat Respiratory: Denies cough, dyspnea or wheezes Cardiovascular: Denies palpitation, chest discomfort or lower extremity swelling Gastrointestinal:  Denies nausea, heartburn or change in bowel habits Skin: Denies abnormal skin rashes MSK: (+) back pain resolved.  Lymphatics: Denies new lymphadenopathy or easy bruising Neurological (+) Numbness of hands, R>L has progressed. Only mildly in her toes.  Behavioral/Psych: Mood is stable, no new changes  All other systems were reviewed with the patient and are negative.  MEDICAL HISTORY:  Past Medical History:  Diagnosis Date  . Abdominal pain, unspecified site   . Anxiety state, unspecified   . Complication of anesthesia    convulsions - when she has appendectomy about 12 years ago at [facility]  . Contact dermatitis and other eczema, due to unspecified cause   . Essential hypertension, benign   . Family history of colon cancer   .  Family history of melanoma   . Family history of prostate cancer   . GERD (gastroesophageal reflux disease)   . Hematuria, unspecified   . Hypertension   . Lower back pain   . Mixed hyperlipidemia   . Rosacea   . rt breast ca dx'd 01/2018   R breast cancer  . Unspecified symptom associated with female genital organs     SURGICAL HISTORY: Past Surgical History:  Procedure Laterality Date  . BREAST RECONSTRUCTION WITH PLACEMENT OF TISSUE EXPANDER AND ALLODERM Right 09/14/2018   Procedure: BREAST RECONSTRUCTION WITH PLACEMENT OF TISSUE EXPANDER AND ALLODERM;  Surgeon: Irene Limbo, MD;  Location: [place];  Service: Plastics;  Laterality: Right;  . CESAREAN SECTION    . DIAGNOSTIC LAPAROSCOPY    . EYE SURGERY     lasix  . LAPAROSCOPIC APPENDECTOMY    . MASTECTOMY WITH RADIOACTIVE SEED GUIDED EXCISION AND AXILLARY SENTINEL LYMPH NODE BIOPSY Right 09/14/2018   Procedure: RIGHT NIPPLE SPARING MASTECTOMY WITH TARGETED DEEP RIGHT AXILLARY LYMPH NODE BIOPSY WITH RADIOACTIVE SEED LOCALIZATION AND RIGHT AXILLARY SENTINEL LYMPH NODE BIOPSY, FROZEN SECTION AND POSSIBLE COMPLETION AXILLARY LYMPH NODE DISSECTION INJECT BLUE DYE RIGHT BREAST;  Surgeon: Fanny Skates, MD;  Location: [place];  Service: General;  Laterality: Right;  . PORTACATH PLACEMENT Left 04/13/2018   Procedure: INSERTION PORT-A-CATH WITH [place];  Surgeon: Fanny Skates, MD;  Location: [place];  Service: General;  Laterality: Left;  . UTERINE FIBROID SURGERY      I have reviewed the social history and family history with the patient and they are unchanged from previous note.  ALLERGIES:  is allergic to metrogel [metronidazole]; other; paxil [paroxetine hcl]; prednisone; and zomig [zolmitriptan].  MEDICATIONS:  Current Outpatient Medications  Medication Sig Dispense Refill  . ALPRAZolam (XANAX) 0.5 MG tablet TAKE 1 TABLET(0.5 MG) BY MOUTH AT BEDTIME AS NEEDED FOR ANXIETY 30 tablet 0  . amoxicillin-clavulanate  (AUGMENTIN) 875-125 MG tablet Take 1 tablet by mouth 2 (two) times daily. 10 tablet 0  . atenolol (TENORMIN) 50 MG tablet Take 50 mg by mouth at bedtime.    . cyclobenzaprine (FLEXERIL) 5 MG tablet Take 1 tablet (5 mg total) by mouth 3 (three) times daily as needed for muscle spasms. 30 tablet 0  . exemestane (AROMASIN) 25 MG tablet Take 1 tablet (25 mg total) by mouth daily after breakfast. 30 tablet 3  . gabapentin (NEURONTIN) 300 MG capsule Take 1 capsule (300 mg total) by mouth 3 (three) times daily. 90 capsule 3  . HYDROcodone-acetaminophen (NORCO) 5-325 MG tablet Take 1-2 tablets by mouth every 4 (four) hours as needed for moderate pain or severe pain. 40 tablet 0  . omeprazole (PRILOSEC) 20 MG capsule Take 1 capsule (20 mg total) by mouth 2 (two) times daily before a meal. 60 capsule 2  . potassium chloride (KLOR-CON) 20 MEQ packet Take 20 mEq by mouth 2 (two) times daily. Twice daily for 7 days then once daily 30 packet 1  . potassium chloride SA (K-DUR) 20 MEQ tablet Take one tab dialy 30 tablet 3  . traMADol (ULTRAM) 50  MG tablet Take 1 tablet BID PRN pain 30 tablet 0  . venlafaxine (EFFEXOR) 37.5 MG tablet Take 1 tablet (37.5 mg total) by mouth 2 (two) times daily. 30 tablet 3   No current facility-administered medications for this visit.     PHYSICAL EXAMINATION: ECOG PERFORMANCE STATUS: 0 - Asymptomatic  Vitals:   05/16/19 1429  BP: 131/65  Pulse: 68  Resp: 17  Temp: 99.1 F (37.3 C)  SpO2: 99%   Filed Weights   05/16/19 1429  Weight: 154 lb 9.6 oz (70.1 kg)    GENERAL:alert, no distress and comfortable SKIN: skin color, texture, turgor are normal, no rashes or significant lesions EYES: normal, Conjunctiva are pink and non-injected, sclera clear  NECK: supple, thyroid normal size, non-tender, without nodularity LYMPH:  no palpable lymphadenopathy in the cervical, axillary  LUNGS: clear to auscultation and percussion with normal breathing effort HEART: regular rate &  rhythm and no murmurs and no lower extremity edema ABDOMEN:abdomen soft, non-tender and normal bowel sounds Musculoskeletal:no cyanosis of digits and no clubbing (+) right upper back tenderness (+) Left flank tenderness NEURO: alert & oriented x 3 with fluent speech, no focal motor/sensory deficits BREAST: S/p right breast mastectomy: surgical incision healed well No palpable mass, nodules or adenopathy bilaterally. Breast exam benign.   LABORATORY DATA:  I have reviewed the data as listed CBC Latest Ref Rng & Units 03/18/2019 02/25/2019 02/18/2019  WBC 4.0 - 10.5 K/uL 4.7 5.2 5.9  Hemoglobin 12.0 - 15.0 g/dL 11.5(L) 10.2(L) 10.1(L)  Hematocrit 36.0 - 46.0 % 32.5(L) 29.0(L) 28.6(L)  Platelets 150 - 400 K/uL 119(L) 134(L) 118(L)     CMP Latest Ref Rng & Units 03/18/2019 02/25/2019 02/18/2019  Glucose 70 - 99 mg/dL 106(H) 99 97  BUN 6 - 20 mg/dL _0 Creatinine 0.44 - 1.00 mg/dL 0.69 0.65 0.54  Sodium 135 - 145 mmol/L 141 141 137  Potassium 3.5 - 5.1 mmol/L 3.9 3.9 3.3(L)  Chloride 98 - 111 mmol/L 107 108 102  CO2 22 - 32 mmol/L _1 Calcium 8.9 - 10.3 mg/dL 9.0 8.8(L) 8.5(L)  Total Protein 6.5 - 8.1 g/dL 7.4 7.0 6.6  Total Bilirubin 0.3 - 1.2 mg/dL 0.5 0.4 0.8  Alkaline Phos 38 - 126 U/L 159(H) 160(H) 128(H)  AST 15 - 41 U/L 26 33 62(H)  ALT 0 - 44 U/L 19 28 47(H)      RADIOGRAPHIC STUDIES: I have personally reviewed the radiological images as listed and agreed with the findings in the report. No results found.   ASSESSMENT & PLAN:  Claire Guzman is a 53 y.o. female with   1. Malignant neoplasm of upper-inner quadrant of right breast in female, invasive ductal carcinoma, cT2N1M0 stage IB, ER+/PR+/HER2+, G3, ypT0N0 -She was diagnosed in 02/2018. Given the size of her mass and LN involvement, predicts highrisk of recurrence.  -She is s/pneoadjuvantchemoof 6 cyclesTCHP,right breast mastectomyandaxillary lymph node dissectionon 2/18/20and adjuvant  radiation.She completed 1 year maintenance Herceptin/Perjeta in 03/2019.  -I discussedthat complete pathological response predict good outcome, her risk of recurrence is small. -To reduce her riskof distant breast cancer, I started her on anti-estrogen therapy with Tamoxifen in 12/2018. She experienced heart palpitations, taste change and worsened neuropathy and hot flashes. She stopped on 01/03/19.I started her on Anastrozole on 02/04/19 but stopped on 02/14/19 due to her worsening body aches and recent UTI. Per patient she also tried letrozole and did not tolerate. She was switched to Exemestane, has not received  yet but plans to soon.  -I also discussed more anti-Her2 treatment with oral Nerlynx. I reviewed side effects especially diarrhea. Given side effects I do not plan to start until she has started Exemestane and managing well. I gave her print out of drug. She is interested in trying this.  -She plans to have breast reconstruction therapy and PAC removal with Dr. Iran Planas on 07/26/19.  -She has recovered from prior treatment well except worsening residual numbness, mainly on the right hand, her hand functions are mainly preserved. Will monitor. Labs reviewed, CBC and CMP WNL ALP 159. CA 27.29 still pending.  -Her 04/2019 Mammogram was normal.  She is concerned about screening mammogram is not sensitive enough,  I discussed MRI breast once a year in between her mammograms.  Her breast density is category C for mammogram.  She is agreeable. Next MRI in 09/2019 and next Mammogram in 04/2020 -Virtual visit in 2 months. Will determine if she proceeds with Nerlynx at that time.  -She opted to receive flu shot today    2. Bone Health  -She has not had a bone density scan before. She has been on AI lately which can decrease her bone density.  -I recommend DEXA in the next 1-2 months and repeat every 2 years. She is agreeable.   3. Geneticsshowed no pathogenic mutations.   4. Anxiety  -She has  history of anxiety, currently on Xanax as needed -Stable, she has been stressed with her family andunemploymentlately. -On Effexor 37.41m daily.  5. HTN -She will continue atenolol. -BP is well controlled  6. Bilateral small lung nodules -Her staging CT scan showed bilateral multiple small lung nodules, 3 to 4 mm, indeterminate, likely benign. She is a never smoker -CT chest from 01/04/19 showsstable tiny b/l nodules, noconcerns for malignancy. 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 She has mild dexterity loss in her hands.  -She tried Gabapentin but stopped due to drowsiness at low dose.  -I encouraged her use B complex to help. I also discussed this can take time to recover and may have residual effects permanently.    Plan: -She is clinically doing well  -flu shot today  -Start Exemestane as soon as she receives the medication  -Virtual visit in 2 months, to start her on Neratinib if she is doing well  -DEXA scan in next few months    No problem-specific Assessment & Plan notes found for this encounter.   Orders Placed This Encounter  Procedures  . DG Bone Density    Standing Status:   Future    Standing Expiration Date:   05/15/2020    Order Specific Question:   Reason for Exam (SYMPTOM  OR DIAGNOSIS REQUIRED)    Answer:   screening    Order Specific Question:   Is the patient pregnant?    Answer:   No    Order Specific Question:   [facility]    [facility]  All questions were answered. The patient knows to call the clinic with any problems, questions or concerns. No barriers to learning was detected. I spent 20 minutes counseling the patient face to face. The total time spent in the appointment was 25 minutes and more than 50% was on counseling and review of test results     YTruitt Merle  MD  05/16/2019   I, Joslyn Devon, am acting as scribe for Truitt Merle, MD.   I have reviewed the above documentation for accuracy and completeness, and I agree with the above.

## 2019-05-13 ENCOUNTER — Other Ambulatory Visit: Payer: Self-pay

## 2019-05-13 DIAGNOSIS — C50211 Malignant neoplasm of upper-inner quadrant of right female breast: Secondary | ICD-10-CM

## 2019-05-13 DIAGNOSIS — Z170 Estrogen receptor positive status [ER+]: Secondary | ICD-10-CM

## 2019-05-16 ENCOUNTER — Inpatient Hospital Stay: Payer: BC Managed Care – PPO

## 2019-05-16 ENCOUNTER — Encounter: Payer: Self-pay | Admitting: Hematology

## 2019-05-16 ENCOUNTER — Inpatient Hospital Stay: Payer: BC Managed Care – PPO | Attending: Hematology

## 2019-05-16 ENCOUNTER — Other Ambulatory Visit: Payer: Self-pay

## 2019-05-16 ENCOUNTER — Inpatient Hospital Stay (HOSPITAL_BASED_OUTPATIENT_CLINIC_OR_DEPARTMENT_OTHER): Payer: BC Managed Care – PPO | Admitting: Hematology

## 2019-05-16 VITALS — BP 131/65 | HR 68 | Temp 99.1°F | Resp 17 | Ht 64.0 in | Wt 154.6 lb

## 2019-05-16 DIAGNOSIS — Z170 Estrogen receptor positive status [ER+]: Secondary | ICD-10-CM

## 2019-05-16 DIAGNOSIS — C50211 Malignant neoplasm of upper-inner quadrant of right female breast: Secondary | ICD-10-CM | POA: Diagnosis not present

## 2019-05-16 DIAGNOSIS — Z23 Encounter for immunization: Secondary | ICD-10-CM | POA: Diagnosis not present

## 2019-05-16 DIAGNOSIS — Z95828 Presence of other vascular implants and grafts: Secondary | ICD-10-CM

## 2019-05-16 DIAGNOSIS — E2839 Other primary ovarian failure: Secondary | ICD-10-CM | POA: Diagnosis not present

## 2019-05-16 LAB — CBC WITH DIFFERENTIAL (CANCER CENTER ONLY)
Abs Immature Granulocytes: 0.01 10*3/uL (ref 0.00–0.07)
Basophils Absolute: 0 10*3/uL (ref 0.0–0.1)
Basophils Relative: 0 %
Eosinophils Absolute: 0.1 10*3/uL (ref 0.0–0.5)
Eosinophils Relative: 2 %
HCT: 33.9 % — ABNORMAL LOW (ref 36.0–46.0)
Hemoglobin: 12.1 g/dL (ref 12.0–15.0)
Immature Granulocytes: 0 %
Lymphocytes Relative: 48 %
Lymphs Abs: 2.2 10*3/uL (ref 0.7–4.0)
MCH: 31.8 pg (ref 26.0–34.0)
MCHC: 35.7 g/dL (ref 30.0–36.0)
MCV: 89.2 fL (ref 80.0–100.0)
Monocytes Absolute: 0.4 10*3/uL (ref 0.1–1.0)
Monocytes Relative: 9 %
Neutro Abs: 1.9 10*3/uL (ref 1.7–7.7)
Neutrophils Relative %: 41 %
Platelet Count: 144 10*3/uL — ABNORMAL LOW (ref 150–400)
RBC: 3.8 MIL/uL — ABNORMAL LOW (ref 3.87–5.11)
RDW: 12.1 % (ref 11.5–15.5)
WBC Count: 4.6 10*3/uL (ref 4.0–10.5)
nRBC: 0 % (ref 0.0–0.2)

## 2019-05-16 LAB — CMP (CANCER CENTER ONLY)
ALT: 20 U/L (ref 0–44)
AST: 24 U/L (ref 15–41)
Albumin: 4.2 g/dL (ref 3.5–5.0)
Alkaline Phosphatase: 155 U/L — ABNORMAL HIGH (ref 38–126)
Anion gap: 12 (ref 5–15)
BUN: 13 mg/dL (ref 6–20)
CO2: 23 mmol/L (ref 22–32)
Calcium: 9.1 mg/dL (ref 8.9–10.3)
Chloride: 107 mmol/L (ref 98–111)
Creatinine: 0.67 mg/dL (ref 0.44–1.00)
GFR, Est AFR Am: >60 mL/min (ref >60)
GFR, Estimated: >60 mL/min (ref >60)
Glucose, Bld: 98 mg/dL (ref 70–99)
Potassium: 3.7 mmol/L (ref 3.5–5.1)
Sodium: 142 mmol/L (ref 135–145)
Total Bilirubin: 0.5 mg/dL (ref 0.3–1.2)
Total Protein: 7.5 g/dL (ref 6.5–8.1)

## 2019-05-16 MED ORDER — HEPARIN SOD (PORK) LOCK FLUSH 100 UNIT/ML IV SOLN
500.0000 [IU] | Freq: Once | INTRAVENOUS | Status: AC | PRN
  Administered 2019-05-16: 500 [IU]
  Filled 2019-05-16: qty 5

## 2019-05-16 MED ORDER — INFLUENZA VAC SPLIT QUAD 0.5 ML IM SUSY
PREFILLED_SYRINGE | INTRAMUSCULAR | Status: AC
  Filled 2019-05-16: qty 0.5

## 2019-05-16 MED ORDER — SODIUM CHLORIDE 0.9% FLUSH
10.0000 mL | INTRAVENOUS | Status: DC | PRN
  Administered 2019-05-16: 10 mL
  Filled 2019-05-16: qty 10

## 2019-05-16 MED ORDER — INFLUENZA VAC SPLIT QUAD 0.5 ML IM SUSY
0.5000 mL | PREFILLED_SYRINGE | Freq: Once | INTRAMUSCULAR | Status: AC
  Administered 2019-05-16: 0.5 mL via INTRAMUSCULAR

## 2019-05-16 NOTE — Patient Instructions (Signed)

## 2019-05-17 ENCOUNTER — Telehealth: Payer: Self-pay | Admitting: Hematology

## 2019-05-17 LAB — CANCER ANTIGEN 27.29: CA 27.29: 27.6 U/mL (ref 0.0–38.6)

## 2019-05-17 NOTE — Telephone Encounter (Signed)
Scheduled appt per 10/19 los. ° °Spoke with pt and she is aware of the appt date and time. °

## 2019-05-18 ENCOUNTER — Encounter: Payer: Self-pay | Admitting: *Deleted

## 2019-05-19 ENCOUNTER — Other Ambulatory Visit: Payer: Self-pay | Admitting: Nurse Practitioner

## 2019-05-19 DIAGNOSIS — C50211 Malignant neoplasm of upper-inner quadrant of right female breast: Secondary | ICD-10-CM

## 2019-05-19 DIAGNOSIS — Z170 Estrogen receptor positive status [ER+]: Secondary | ICD-10-CM

## 2019-05-23 ENCOUNTER — Telehealth: Payer: Self-pay

## 2019-05-23 NOTE — Telephone Encounter (Signed)
Patient calls in with c/o big toe has an ingrown nail which is red and very painful.  Consulted with Sandi Mealy PA-C and he is recommending she go to Urgent Care as they may have to cut the nail out.  Informed her and she verbalized an understanding.

## 2019-06-05 ENCOUNTER — Other Ambulatory Visit: Payer: Self-pay | Admitting: Hematology

## 2019-06-05 DIAGNOSIS — Z170 Estrogen receptor positive status [ER+]: Secondary | ICD-10-CM

## 2019-06-05 DIAGNOSIS — C50211 Malignant neoplasm of upper-inner quadrant of right female breast: Secondary | ICD-10-CM

## 2019-06-16 ENCOUNTER — Inpatient Hospital Stay: Payer: BC Managed Care – PPO | Attending: Hematology | Admitting: Hematology

## 2019-06-16 ENCOUNTER — Telehealth: Payer: Self-pay

## 2019-06-16 ENCOUNTER — Encounter: Payer: Self-pay | Admitting: Hematology

## 2019-06-16 ENCOUNTER — Ambulatory Visit (HOSPITAL_COMMUNITY)
Admission: RE | Admit: 2019-06-16 | Discharge: 2019-06-16 | Disposition: A | Discharge status: Home / Self Care | Payer: BC Managed Care – PPO | Source: Ambulatory Visit | Attending: Hematology | Admitting: Hematology

## 2019-06-16 ENCOUNTER — Other Ambulatory Visit: Payer: Self-pay

## 2019-06-16 VITALS — BP 144/79 | HR 80 | Temp 98.2°F | Resp 18 | Ht 64.0 in | Wt 158.7 lb

## 2019-06-16 DIAGNOSIS — Z9011 Acquired absence of right breast and nipple: Secondary | ICD-10-CM | POA: Diagnosis not present

## 2019-06-16 DIAGNOSIS — M79601 Pain in right arm: Secondary | ICD-10-CM | POA: Diagnosis present

## 2019-06-16 DIAGNOSIS — Z8042 Family history of malignant neoplasm of prostate: Secondary | ICD-10-CM | POA: Insufficient documentation

## 2019-06-16 DIAGNOSIS — Z79811 Long term (current) use of aromatase inhibitors: Secondary | ICD-10-CM | POA: Diagnosis not present

## 2019-06-16 DIAGNOSIS — Z800 Family history of malignant neoplasm of digestive organs: Secondary | ICD-10-CM | POA: Insufficient documentation

## 2019-06-16 DIAGNOSIS — Z923 Personal history of irradiation: Secondary | ICD-10-CM | POA: Diagnosis not present

## 2019-06-16 DIAGNOSIS — Z9221 Personal history of antineoplastic chemotherapy: Secondary | ICD-10-CM | POA: Insufficient documentation

## 2019-06-16 DIAGNOSIS — C50211 Malignant neoplasm of upper-inner quadrant of right female breast: Secondary | ICD-10-CM

## 2019-06-16 DIAGNOSIS — Z170 Estrogen receptor positive status [ER+]: Secondary | ICD-10-CM | POA: Diagnosis not present

## 2019-06-16 DIAGNOSIS — Z808 Family history of malignant neoplasm of other organs or systems: Secondary | ICD-10-CM | POA: Diagnosis not present

## 2019-06-16 DIAGNOSIS — F419 Anxiety disorder, unspecified: Secondary | ICD-10-CM

## 2019-06-16 MED ORDER — ALPRAZOLAM 0.5 MG PO TABS: ORAL_TABLET | ORAL | 0 refills | Status: DC

## 2019-06-16 NOTE — Progress Notes (Signed)
[place]   [phone] [phone]   Clinic Follow up Note   Patient Care Team: Lujean Amel, MD as PCP - General (Family Medicine) Fanny Skates, MD as Consulting Physician (General Surgery) Truitt Merle, MD as Consulting Physician (Hematology) Eppie Gibson, MD as Attending Physician (Radiation Oncology) Mauro Kaufmann, RN as Oncology Nurse Navigator Rockwell Germany, RN as Oncology Nurse Navigator  Date of Service:  06/16/2019  CHIEF COMPLAINT: F/u for right breast cancer and right upper extremity pain and tightness  SUMMARY OF ONCOLOGIC HISTORY: Oncology History Overview Note  Cancer Staging Malignant neoplasm of upper-inner quadrant of right breast in female, estrogen receptor positive ([place]) Staging form: Breast, AJCC 8th Edition - Clinical stage from 03/08/2018: Stage IB (cT2, cN1, cM0, G3, ER+, PR+, HER2+) - Signed by Truitt Merle, MD on 03/16/2018     Malignant neoplasm of upper-inner quadrant of right breast in female, estrogen receptor positive ([place])  02/24/2018 Mammogram   screening mammogram on 02/24/2018 that was benign   03/04/2018 Mammogram   diagnostic mammogram on 03/04/2018 due to a palpable mass on her right breast. Diagnostic mammogram, breast US revealed and biopsy revealed suspicious mass in the right breast at 2:30 at the palpable site of concern and one suspicious right axillary LN.   03/08/2018 Cancer Staging   Staging form: Breast, AJCC 8th Edition - Clinical stage from 03/08/2018: Stage IB (cT2, cN1, cM0, G3, ER+, PR+, HER2+) - Signed by Truitt Merle, MD on 03/16/2018   03/08/2018 Receptors her2   Her2 positive, ER 90% PR 5% and Ki67 60%   03/08/2018 Pathology Results   Diagnosis 1. Breast, right, needle core biopsy, 2:30 - INVASIVE DUCTAL CARCINOMA, GRADE 3. - LYMPHOVASCULAR SPACE INVOLVEMENT BY TUMOR. 2. Lymph node, needle/core biopsy, right axilla - METASTATIC CARCINOMA INVOLVING SCANT LYMPHOID TISSUE.   03/10/2018  Initial Diagnosis   Malignant neoplasm of upper-inner quadrant of right breast in female, estrogen receptor positive ([place])   03/20/2018 Imaging   MRI brain 03/20/18 IMPRESSION: No evidence of metastatic disease.  Normal appearance of brain. Slightly heterogeneous marrow pattern of the upper cervical spine, nonspecific but likely benign, especially in the setting of early stage disease.    03/24/2018 Echocardiogram   Baseline ECHO 03/24/18 Study Conclusions - Left ventricle: The cavity size was normal. Wall thickness was   increased in a pattern of mild LVH. Systolic function was normal.   The estimated ejection fraction was in the range of 60% to 65%.   Wall motion was normal; there were no regional wall motion   abnormalities. Doppler parameters are consistent with abnormal   left ventricular relaxation (grade 1 diastolic dysfunction). - Right ventricle: The cavity size was mildly dilated. Wall   thickness was normal.   03/25/2018 Imaging   MRI Breast B/l 03/25/18 IMPRESSION: 1. The patient's primary malignancy in the posterior right breast measures 3.2 x 2 x 2.6 cm with apparent invasion of the underlying pectoralis muscle. Numerous surrounding abnormal enhancing masses, consistent with satellite lesions, extend from 11 o'clock in the right breast into the inferolateral right breast with a total span of suspected disease measuring 4.5 x 4.6 x 6.4 cm in AP, transverse, and craniocaudal dimension. The suspected extent of disease involves the superior and inferolateral quadrants. The primary malignancy also extends just across midline into the superior medial quadrant. 2. No MRI evidence of malignancy in the left breast. 3. Known metastatic noted in the right axilla.   03/25/2018 Imaging   Whole body bone  scan 03/25/18 IMPRESSION: No scintigraphic evidence skeletal metastasis.   03/25/2018 Imaging   CT CAP W Contrast 03/25/18 IMPRESSION: 1. Mass within the deep aspect of the right  breast along the pectoralis muscle compatible with primary breast malignancy. 2. There is suggestion of two additional possible masses within the lateral aspect of the right breast versus nodular appearing breast tissue. Recommend dedicated evaluation with bilateral breast MRI. 3. Mildly thickened right axillary lymph node compatible with metastatic adenopathy, recently biopsied. 4. Multiple bilateral pulmonary nodules are indeterminate, potentially sequelae of prior infectious/inflammatory process. Recommend attention on follow-up. Consider follow-up CT in 6 months. 5. Portal venous gas is demonstrated within the left hepatic lobe. Additionally, there is wall thickening of the cecum and ascending colon with small amount of gas within the pericolonic vasculature. Constellation of findings is indeterminate in etiology however may be secondary to colitis at this location with considerations including infectious, inflammatory or ischemic etiologies. Correlate for recent procedure. If patient is not up-to-date for colonic screening, recommend further evaluation with colonoscopy to exclude the possibility of colonic mass within the cecum/ascending colon. 6. These results were called by telephone at the time of interpretation on 03/25/2018 at 9:31 am to Dr. Truitt Merle , who verbally acknowledged these results.   03/30/2018 - 04/08/2019 Chemotherapy   Neoadjuvant TCHP every 3 weeks starting 03/30/18. Completed 6 cycles of TCHP on 07/23/18.  Continued with maintenance Herceptin/Perjeta q3weeks to complete 1 year.    04/09/2018 Genetic Testing   Negative genetic testing on the [facility] Common Hereditary Cancers Panel. The Common Hereditary Cancers Panel offered by [facility] includes sequencing and/or deletion duplication testing of the following 47 genes: APC, ATM, AXIN2, BARD1, BMPR1A, BRCA1, BRCA2, BRIP1, CDH1, CDKN2A (p14ARF), CDKN2A (p16INK4a), CKD4, CHEK2, CTNNA1, DICER1, EPCAM (Deletion/duplication  testing only), GREM1 (promoter region deletion/duplication testing only), KIT, MEN1, MLH1, MSH2, MSH3, MSH6, MUTYH, NBN, NF1, NHTL1, PALB2, PDGFRA, PMS2, POLD1, POLE, PTEN, RAD50, RAD51C, RAD51D, SDHB, SDHC, SDHD, SMAD4, SMARCA4. STK11, TP53, TSC1, TSC2, and VHL.  The following genes were evaluated for sequence changes only: SDHA and HOXB13 c.251G>A variant only.   Genetic testing did detect a Variant of Unknown Significance (VUS) in the POLD1 gene called c.985C>T (p.Pro329Ser). At this time, it is unknown if this variant is associated with increased cancer risk or if this is a normal finding, but most variants such as this get reclassified to being inconsequential. It should not be used to make medical management decisions.   The report date is 04/09/2018.   07/24/2018 Breast MRI   IMPRESSION: 1. Resolution of the enhancing mass (known cancer) and small satellite lesions previously seen in the right breast following chemotherapy.  2. The metastatic lymph node in the right axilla has decreased in size.  3.  No MRI evidence of left breast malignancy.   09/14/2018 Surgery   RIGHT NIPPLE SPARING MASTECTOMY WITH TARGETED DEEP RIGHT AXILLARY LYMPH NODE BIOPSY WITH RADIOACTIVE SEED LOCALIZATION AND RIGHT AXILLARY SENTINEL LYMPH NODE BIOPSY by Dr. Dalbert Batman  09/14/18    09/14/2018 Pathology Results   Diagnosis 1. Lymph node, sentinel, biopsy, right axillary with radioactive seed - ONE OF ONE LYMPH NODES NEGATIVE FOR CARCINOMA (0/1). - MILD TREATMENT EFFECT. - BIOPSY SITE. 2. Lymph node, sentinel, biopsy, right axillary - ONE OF ONE LYMPH NODES NEGATIVE FOR CARCINOMA (0/1). 3. Lymph node, sentinel, biopsy, right - ONE OF ONE LYMPH NODES NEGATIVE FOR CARCINOMA (0/1). 4. Lymph node, sentinel, biopsy, right - ONE OF ONE LYMPH NODES NEGATIVE FOR CARCINOMA (0/1). 5. Lymph  node, sentinel, biopsy, right - ONE OF ONE LYMPH NODES NEGATIVE FOR CARCINOMA (0/1). 6. Lymph node, sentinel, biopsy, right -  ONE OF ONE LYMPH NODES NEGATIVE FOR CARCINOMA (0/1). 7. Lymph node, sentinel, biopsy, right - ONE OF ONE LYMPH NODES NEGATIVE FOR CARCINOMA (0/1). 8. Lymph node, sentinel, biopsy, right - ONE OF ONE LYMPH NODES NEGATIVE FOR CARCINOMA (0/1). 9. Lymph node, sentinel, biopsy, right - ONE OF ONE LYMPH NODES NEGATIVE FOR CARCINOMA (0/1). 10. Lymph node, sentinel, biopsy, right - ONE OF ONE LYMPH NODES NEGATIVE FOR CARCINOMA (0/1). 11. Lymph node, sentinel, biopsy, right - ONE OF ONE LYMPH NODES NEGATIVE FOR CARCINOMA (0/1). 12. Lymph node, sentinel, biopsy, right - ONE OF ONE LYMPH NODES NEGATIVE FOR CARCINOMA (0/1). 13. Nipple Biopsy, right - BENIGN NIPPLE TISSUE. 14. Breast, simple mastectomy, right - NO RESIDUAL CARCINOMA IDENTIFIED. - TREATMENT EFFECT. - FIBROCYSTIC CHANGE. - ONE OF ONE LYMPH NODES NEGATIVE FOR CARCINOMA (0/1). - SEE ONCOLOGY TABLE.   09/14/2018 Cancer Staging   Staging form: Breast, AJCC 8th Edition - Pathologic stage from 09/14/2018: No Stage Recommended (ypT0, pN0, cM0, GX, ER: Not Assessed, PR: Not Assessed, HER2: Not Assessed) - Signed by Truitt Merle, MD on 09/23/2018   11/03/2018 - 12/10/2018 Radiation Therapy   Adjuvant Radiation with Dr. Isidore Moos    12/27/2018 -  Anti-estrogen oral therapy   Start Tamoxifen 9m daily in 12/27/2018 stopped after 1 week due to poor toleration. She started anastrozole on 02/04/19 but stopped on 02/14/19 due to worsening muscle aches. She tried Letrozole but did not tolerate. She was switched to Exemestane in late 04/2019.    01/04/2019 Imaging   CT chest  IMPRESSION: 1. Stable tiny subpleural and perifissural nodules bilaterally. No suspicious pulmonary nodule or mass. 2. No focal airspace disease in the right lung. 3.  Aortic Atherosclerois (ICD10-170.0)     03/08/2019 Imaging   CT AP W Contrast 03/08/19  IMPRESSION: 1. No metastatic disease in the abdomen pelvis. 2. No evidence adverse immunotherapy therapy reaction.   03/08/2019  Imaging   Whole body bone scan 03/08/19  IMPRESSION: No definite scintigraphic evidence of osseous metastases.      CURRENT THERAPY:  Start Tamoxifen 269maily in 6/1/2020stopped after 1 week due to poor toleration.She started anastrozole on 02/04/19 but stopped on 02/14/19 due to worsening muscle aches.She tried Letrozole but did not tolerate. She was switched to Exemestane in late 04/2019.    INTERVAL HISTORY:  Claire Guzman here for a follow up. She presents to the clinic alone. She notes yesterday she noticed having tightness of upper right arm. She notes she has normal ROM of right shoulder. She notes she did PT after her breast surgery. She has been on exemestane for 1 month but was off for a week while on a trip. She notes just today she feels lethargic without confusion and felt she did not want to get out of bed. She notes no significant change in tightness and numbness of hands when off her Exemestane.  [facility].    REVIEW OF SYSTEMS:   Constitutional: Denies fevers, chills or abnormal weight loss (+) fatigue  Eyes: Denies blurriness of vision Ears, nose, mouth, throat, and face: Denies mucositis or sore throat Respiratory: Denies cough, dyspnea or wheezes Cardiovascular: Denies palpitation, chest discomfort or lower extremity swelling Gastrointestinal:  Denies nausea, heartburn or change in bowel habits Skin: Denies abnormal skin rashes Lymphatics: Denies new lymphadenopathy or  easy bruising (+) tightness of right upper arm  Neurological: (+) Numbness and tightness of right hand Behavioral/Psych: Mood is stable, no new changes  All other systems were reviewed with the patient and are negative.  MEDICAL HISTORY:  Past Medical History:  Diagnosis Date   Abdominal pain, unspecified site    Anxiety state, unspecified    Complication of anesthesia    convulsions - when she has  appendectomy about 12 years ago at [facility]   Contact dermatitis and other eczema, due to unspecified cause    Essential hypertension, benign    Family history of colon cancer    Family history of melanoma    Family history of prostate cancer    GERD (gastroesophageal reflux disease)    Hematuria, unspecified    Hypertension    Lower back pain    Mixed hyperlipidemia    Rosacea    rt breast ca dx'd 01/2018   R breast cancer   Unspecified symptom associated with female genital organs     SURGICAL HISTORY: Past Surgical History:  Procedure Laterality Date   BREAST RECONSTRUCTION WITH PLACEMENT OF TISSUE EXPANDER AND ALLODERM Right 09/14/2018   Procedure: BREAST RECONSTRUCTION WITH PLACEMENT OF TISSUE EXPANDER AND ALLODERM;  Surgeon: Irene Limbo, MD;  Location: [facility];  Service: Plastics;  Laterality: Right;   CESAREAN SECTION     DIAGNOSTIC LAPAROSCOPY     EYE SURGERY     lasix   LAPAROSCOPIC APPENDECTOMY     MASTECTOMY WITH RADIOACTIVE SEED GUIDED EXCISION AND AXILLARY SENTINEL LYMPH NODE BIOPSY Right 09/14/2018   Procedure: RIGHT NIPPLE SPARING MASTECTOMY WITH TARGETED DEEP RIGHT AXILLARY LYMPH NODE BIOPSY WITH RADIOACTIVE SEED LOCALIZATION AND RIGHT AXILLARY SENTINEL LYMPH NODE BIOPSY, FROZEN SECTION AND POSSIBLE COMPLETION AXILLARY LYMPH NODE DISSECTION INJECT BLUE DYE RIGHT BREAST;  Surgeon: Fanny Skates, MD;  Location: [facility];  Service: General;  Laterality: Right;   PORTACATH PLACEMENT Left 04/13/2018   Procedure: INSERTION PORT-A-CATH WITH [place];  Surgeon: Fanny Skates, MD;  Location: [facility];  Service: General;  Laterality: Left;   UTERINE FIBROID SURGERY      I have reviewed the social history and family history with the patient and they are unchanged from previous note.  ALLERGIES:  is allergic to metrogel [metronidazole]; other; paxil [paroxetine hcl]; prednisone; and zomig [zolmitriptan].  MEDICATIONS:  Current Outpatient  Medications  Medication Sig Dispense Refill   ALPRAZolam (XANAX) 0.5 MG tablet TAKE 1 TABLET(0.5 MG) BY MOUTH AT BEDTIME AS NEEDED FOR ANXIETY 30 tablet 0   atenolol (TENORMIN) 50 MG tablet Take 50 mg by mouth at bedtime.     cyclobenzaprine (FLEXERIL) 5 MG tablet TAKE 1 TABLET(5 MG) BY MOUTH THREE TIMES DAILY AS NEEDED FOR MUSCLE SPASMS 30 tablet 0   exemestane (AROMASIN) 25 MG tablet Take 1 tablet (25 mg total) by mouth daily after breakfast. 30 tablet 3   gabapentin (NEURONTIN) 300 MG capsule Take 1 capsule (300 mg total) by mouth 3 (three) times daily. 90 capsule 3   HYDROcodone-acetaminophen (NORCO) 5-325 MG tablet Take 1-2 tablets by mouth every 4 (four) hours as needed for moderate pain or severe pain. 40 tablet 0   omeprazole (PRILOSEC) 20 MG capsule Take 1 capsule (20 mg total) by mouth 2 (two) times daily before a meal. 60 capsule 2   potassium chloride (KLOR-CON) 20 MEQ packet Take 20 mEq by mouth 2 (two) times daily. Twice daily for 7 days then once daily 30 packet 1   potassium  chloride SA (KLOR-CON) 20 MEQ tablet TAKE 1 TABLET BY MOUTH EVERY DAY 30 tablet 3   traMADol (ULTRAM) 50 MG tablet Take 1 tablet BID PRN pain 30 tablet 0   venlafaxine (EFFEXOR) 37.5 MG tablet Take 1 tablet (37.5 mg total) by mouth 2 (two) times daily. 30 tablet 3   No current facility-administered medications for this visit.     PHYSICAL EXAMINATION: ECOG PERFORMANCE STATUS: 1 - Symptomatic but completely ambulatory  Vitals:   06/16/19 1500  BP: (!) 144/79  Pulse: 80  Resp: 18  Temp: 98.2 F (36.8 C)  SpO2: 100%   Filed Weights   06/16/19 1500  Weight: 158 lb 11.2 oz (72 kg)    GENERAL:alert, no distress and comfortable SKIN: skin color, texture, turgor are normal, no rashes or significant lesions EYES: normal, Conjunctiva are pink and non-injected, sclera clear  NECK: supple, thyroid normal size, non-tender, without nodularity LYMPH:  no palpable lymphadenopathy in the cervical,  axillary  LUNGS: clear to auscultation and percussion with normal breathing effort HEART: regular rate & rhythm and no murmurs and no lower extremity edema ABDOMEN:abdomen soft, non-tender and normal bowel sounds Musculoskeletal:no cyanosis of digits and no clubbing  BREAST: S/p right breast mastectomy: Surgical incision healed well. (+) no significant edema or skin change in right arm, she has mild tenderness at upper arm above elbow, no palpable nodule.  No palpable mass, nodules bilaterally. Breast exam benign.  NEURO: alert & oriented x 3 with fluent speech, no focal motor/sensory deficits  LABORATORY DATA:  I have reviewed the data as listed CBC Latest Ref Rng & Units 05/16/2019 03/18/2019 02/25/2019  WBC 4.0 - 10.5 K/uL 4.6 4.7 5.2  Hemoglobin 12.0 - 15.0 g/dL 12.1 11.5(L) 10.2(L)  Hematocrit 36.0 - 46.0 % 33.9(L) 32.5(L) 29.0(L)  Platelets 150 - 400 K/uL 144(L) 119(L) 134(L)     CMP Latest Ref Rng & Units 05/16/2019 03/18/2019 02/25/2019  Glucose 70 - 99 mg/dL 98 106(H) 99  BUN 6 - 20 mg/dL _0 Creatinine 0.44 - 1.00 mg/dL 0.67 0.69 0.65  Sodium 135 - 145 mmol/L 142 141 141  Potassium 3.5 - 5.1 mmol/L 3.7 3.9 3.9  Chloride 98 - 111 mmol/L 107 107 108  CO2 22 - 32 mmol/L _1 Calcium 8.9 - 10.3 mg/dL 9.1 9.0 8.8(L)  Total Protein 6.5 - 8.1 g/dL 7.5 7.4 7.0  Total Bilirubin 0.3 - 1.2 mg/dL 0.5 0.5 0.4  Alkaline Phos 38 - 126 U/L 155(H) 159(H) 160(H)  AST 15 - 41 U/L 24 26 33  ALT 0 - 44 U/L _2 RADIOGRAPHIC STUDIES: I have personally reviewed the radiological images as listed and agreed with the findings in the report. Vas [place] Upper Extremity Venous Duplex  Result Date: 06/16/2019 UPPER VENOUS STUDY  Indications: Right medial arm pain Risk Factors: Cancer Breast cancer. Comparison Study: No prior study on file for comparison Performing Technologist: Sharion Dove RVS  Examination Guidelines: A complete evaluation includes B-mode imaging, spectral Doppler,  color Doppler, and power Doppler as needed of all accessible portions of each vessel. Bilateral testing is considered an integral part of a complete examination. Limited examinations for reoccurring indications may be performed as noted.  Right Findings: +----------+------------+---------+-----------+----------+--------------+  RIGHT      Compressible Phasicity Spontaneous Properties    Summary      +----------+------------+---------+-----------+----------+--------------+  IJV            Full  Yes        Yes                                +----------+------------+---------+-----------+----------+--------------+  Subclavian     Full        Yes        Yes                                +----------+------------+---------+-----------+----------+--------------+  Axillary                   Yes        Yes                                +----------+------------+---------+-----------+----------+--------------+  Brachial       Full        Yes        Yes                                +----------+------------+---------+-----------+----------+--------------+  Radial         Full                                                      +----------+------------+---------+-----------+----------+--------------+  Ulnar                                                    Not visualized  +----------+------------+---------+-----------+----------+--------------+  Cephalic       Full                                                      +----------+------------+---------+-----------+----------+--------------+  Basilic        Full                                                      +----------+------------+---------+-----------+----------+--------------+  Left Findings: +----------+------------+---------+-----------+----------+-------+  LEFT       Compressible Phasicity Spontaneous Properties Summary  +----------+------------+---------+-----------+----------+-------+  Subclavian                 Yes        Yes                          +----------+------------+---------+-----------+----------+-------+  Summary:  Right: No evidence of deep vein thrombosis in the upper extremity. No evidence of superficial vein thrombosis in the upper extremity.  Left: No evidence of thrombosis in the subclavian.  *See table(s) above for measurements and observations.    Preliminary      ASSESSMENT & PLAN:  Karlye Ihrig is a 53 y.o. female with  1. Right arm pain, Rule out DVT  -New onset since yesterday, 3 days after long distance driving  -Based on exam today she has trace edema of right arm, this is likely related to her previous breast surgery.  -She completed PT. I recommend she continue those exercises and start using compression sleeves  -I also recommend she not consistently sleep on right side and elevate right arm/hand with pillow.  -I will order upper right arm doppler to rule out DVT, she is agreeable.  -She can use Tylenol for her right arm pain   2. Malignant neoplasm of upper-inner quadrant of right breast in female, invasive ductal carcinoma, cT2N1M0 stage IB, ER+/PR+/HER2+, G3, ypT0N0 -She was diagnosed in 02/2018. She is s/pneoadjuvantchemoof 6 cyclesTCHP,right breast mastectomyandaxillary lymph node dissectionon 2/18/20and adjuvant radiation.She completed 1 year maintenance Herceptin/Perjeta in 03/2019.  -She started antiestrogen therapy in 12/2018. She did not tolerate tamoxifen, Anastrozole or Letrozole. She is currently on Exemestane since 04/2019. Tolerating well. Continue.  -She plans to have breast reconstruction therapy and PAC removal with Dr. Iran Planas on 07/26/19.      Plan: -I refilled her Xanax today  -Doppler of right upper arm today, it came back negative   -will send her PT a note about her compression sleeves  -Continue Exemestane  -F/u on 08/26/2019 as previously scheduled    No problem-specific Assessment & Plan notes found for this encounter.   No orders of the defined types  were placed in this encounter.  All questions were answered. The patient knows to call the clinic with any problems, questions or concerns. No barriers to learning was detected.  I spent 15 minutes counseling the patient face to face. The total time spent in the appointment was 20 minutes and more than 50% was on counseling and review of test results     Truitt Merle, MD 06/16/2019   I, Joslyn Devon, am acting as scribe for Truitt Merle, MD.   I have reviewed the above documentation for accuracy and completeness, and I agree with the above.

## 2019-06-16 NOTE — Progress Notes (Signed)
VASCULAR LAB PRELIMINARY  PRELIMINARY  PRELIMINARY  PRELIMINARY  Right upper extremity venous duplex completed.    Preliminary report:  See CV proc for preliminary results.   Called Dr. Burr Medico with results  Mauro Kaufmann, [facility], RVT 06/16/2019, 4:03 PM

## 2019-06-16 NOTE — Telephone Encounter (Signed)
Patient calls stating that she is taking exemestane for almost a month now, having some swelling in right arm above elbow and tenderness with numbness in right hand.  Also she is complaining of lethargy.  Offered a virtual visit however patient desires to come in to be seen.  Instructed her to be here at 3:00 pm today to see Dr. Burr Medico.  She verbalized an understanding.

## 2019-06-17 ENCOUNTER — Telehealth: Payer: Self-pay | Admitting: Hematology

## 2019-06-17 ENCOUNTER — Other Ambulatory Visit: Payer: Self-pay | Admitting: Hematology

## 2019-06-17 DIAGNOSIS — C50211 Malignant neoplasm of upper-inner quadrant of right female breast: Secondary | ICD-10-CM

## 2019-06-17 DIAGNOSIS — Z170 Estrogen receptor positive status [ER+]: Secondary | ICD-10-CM

## 2019-06-17 NOTE — Addendum Note (Signed)
Addended by: Truitt Merle on: 06/17/2019 12:23 PM   Modules accepted: Orders

## 2019-06-17 NOTE — Telephone Encounter (Signed)
No los per 11/19 °

## 2019-06-20 NOTE — Telephone Encounter (Signed)
Given to Cira Rue NP for refill

## 2019-06-21 ENCOUNTER — Other Ambulatory Visit: Payer: Self-pay | Admitting: Nurse Practitioner

## 2019-06-21 DIAGNOSIS — Z170 Estrogen receptor positive status [ER+]: Secondary | ICD-10-CM

## 2019-06-21 DIAGNOSIS — C50211 Malignant neoplasm of upper-inner quadrant of right female breast: Secondary | ICD-10-CM

## 2019-06-21 MED ORDER — TRAMADOL HCL 50 MG PO TABS: ORAL_TABLET | ORAL | 0 refills | Status: DC

## 2019-07-11 ENCOUNTER — Other Ambulatory Visit: Payer: Self-pay | Admitting: Nurse Practitioner

## 2019-07-11 ENCOUNTER — Other Ambulatory Visit: Payer: Self-pay | Admitting: Hematology

## 2019-07-11 DIAGNOSIS — F419 Anxiety disorder, unspecified: Secondary | ICD-10-CM

## 2019-07-11 MED ORDER — ALPRAZOLAM 0.5 MG PO TABS: ORAL_TABLET | ORAL | 0 refills | Status: DC

## 2019-07-11 NOTE — Telephone Encounter (Signed)
Forwarding to you for refill.

## 2019-07-13 ENCOUNTER — Telehealth: Payer: Self-pay | Admitting: Hematology

## 2019-07-13 NOTE — H&P (Signed)
Subjective:     Patient ID: Claire Guzman is a 53 y.o. female.  HPI  10 months post op right mastectomy with immediate expander based reconstruction. Scheduled for implant exchange this month. Accompanied by husband. Reports she has stopped working in anticipation of surgery as she was concerned about COVID exposure.   Presented with palpable right breast mass (less than month after negative screening MMG). Diagnostic MMG/US with right breast mass at 2:30, 1 cmfn measuring 2.0 x 1.6 x 2.1 cm, abutting the chest wall. There was one thickened LN. Biopsy demonstrated triple positive IDC,+ LVI, LN + for metastatic disease.  MRI showed primary malignancy in the posterior right breast 3.2 x 2 x 2.6 cm with apparent invasion of the underlying pectoralis muscle. Numerous surrounding satellite lesions, with a total span of suspected disease measuring 4.5 x 4.6 x 6.4 cm. Known metastatic node noted in the right axilla.  Completed neoadjuvant chemotherapy, final MRIwith complete pathologic response. Final pathology no residual carcinoma 0/13 SLN.   Completed adjuvant radiation 5.15.20. Completed Herceptin 03/2019. On Aromasin, unable to tolerate tamoxifen and anastrazole.   Genetics VUS in the POLD1.   Staging scans showed bilateral multiple small lung nodules, 3 to 4 mm, indeterminate, likely benign, plan repeat CT chest in 6 months. Repeat CT 12/2018 stable tiny subpleural and perifissural nodules bilaterally. Bone scan 02/2019 normal.   Prior 38 B Wt stable. Right mastectomy 337 g MMG 04/2019 normal  Review of Systems     Objective:   Physical Exam  Cardiovascular: Normal rate, regular rhythm and normal heart sounds.  Pulmonary/Chest: Effort normal and breath sounds normal.  Abdominal: Soft.  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  Reviewed radiation increase risks reconstruction including wound healing problems, capsular contracture.Planwait approximately 6 months form end XRT for implant exchange. Counseled encouraging data with prepectoral position, use ADM with regards to radiation. Howeversome type of autologous tissue reconstruction post radiation can decrease risks back to baseline.Counseled options of implant exchange alone,LD flap over implanton right, or referral to microsurgeon to discuss other autologous options.Patient declines flap or referral and wants to proceed with implant exchange alone. Reviewed any problems healing, exposure, will need to have LD or some type flap for salvage reconstruction, may need to repeat process expansion.  Reviewed saline vs silicone, shaped vs round.HCG or capacity filled silicone implants may offer reduced risk visible rippling. Reviewed MRI or USsurveillance for rupture with silicone implants. Reviewed examples for 4th generation, capacity filled 4th generation, and HCG implants vs saline implants. Reviewed risks AP flipping that may be more noticeable with 5th generation implants, may require surgery to correct.Reviewed size in part guided by width chest, cannot assure her cup size. Reviewed implants are not permanent devices and will require additional surgery. Patient has elected for saline. Plan smooth round.  Reviewed fat grafting. Reviewed purpose of this to thicken flaps limit visible rippling and some studies showing this may reduce radiation fibrosis. Reviewed donor  site pain, need for compression,  variable take graft, fat necrosis that presents as masses and may require work up. As in prepectoral position and desires saline I do recommend we plan for this and patient agrees. Plan abdominal donor site.  Discussed options left breast augmentation if she desires larger volume, similar upper pole fullness of implants. She declines any surgery left breast.  Additional risks including nut not limited to bleeding, hematoma, seroma, damage to adjacent structures, need for additional surgery, blood clots in legs or chest discussed.  Plan OP surgery, no drains anticipated. Recommend she purchase Spanx type garment for abdomen for post op use. Rx for Robaxin, Bactrim, and oxycodone given.  Discussed risk COVID infectionthrough this elective surgery. Patient will receive COVID testing prior to surgery. Discussed even if patient receivesa negative test result, the tests in some cases may fail to detect the virus or patient maycontract COVID after the test.COVID 19 infectionbefore/during/aftersurgery may result in lead to a higher chance of complication and death.  Natrelle 133S FV-11-T 300 ml tissue expander placed,  fill volume 275 ml saline.

## 2019-07-13 NOTE — Telephone Encounter (Signed)
Contacted patient to verify telephone visit for pre reg °

## 2019-07-15 ENCOUNTER — Telehealth: Payer: Self-pay | Admitting: Hematology

## 2019-07-15 ENCOUNTER — Other Ambulatory Visit: Payer: Self-pay

## 2019-07-15 ENCOUNTER — Inpatient Hospital Stay: Payer: BC Managed Care – PPO | Admitting: Hematology

## 2019-07-15 ENCOUNTER — Encounter (HOSPITAL_BASED_OUTPATIENT_CLINIC_OR_DEPARTMENT_OTHER): Payer: Self-pay | Admitting: Plastic Surgery

## 2019-07-15 NOTE — Telephone Encounter (Signed)
Returned patient's phone call regarding rescheduling 12/18 appointment, per patient's request appointment has moved to 01/14.

## 2019-07-23 ENCOUNTER — Other Ambulatory Visit (HOSPITAL_COMMUNITY)
Admission: RE | Admit: 2019-07-23 | Discharge: 2019-07-23 | Disposition: A | Discharge status: Home / Self Care | Payer: BC Managed Care – PPO | Source: Ambulatory Visit | Attending: Plastic Surgery | Admitting: Plastic Surgery

## 2019-07-23 DIAGNOSIS — Z01812 Encounter for preprocedural laboratory examination: Secondary | ICD-10-CM | POA: Insufficient documentation

## 2019-07-23 DIAGNOSIS — Z20828 Contact with and (suspected) exposure to other viral communicable diseases: Secondary | ICD-10-CM | POA: Diagnosis not present

## 2019-07-23 LAB — SARS CORONAVIRUS 2 (TAT 6-24 HRS): SARS Coronavirus 2: NEGATIVE

## 2019-07-26 ENCOUNTER — Other Ambulatory Visit: Payer: Self-pay

## 2019-07-26 ENCOUNTER — Ambulatory Visit (HOSPITAL_BASED_OUTPATIENT_CLINIC_OR_DEPARTMENT_OTHER): Payer: BC Managed Care – PPO | Admitting: Anesthesiology

## 2019-07-26 ENCOUNTER — Ambulatory Visit (HOSPITAL_BASED_OUTPATIENT_CLINIC_OR_DEPARTMENT_OTHER)
Admission: RE | Admit: 2019-07-26 | Discharge: 2019-07-26 | Disposition: A | Discharge status: Home / Self Care | Payer: BC Managed Care – PPO | Attending: Plastic Surgery | Admitting: Plastic Surgery

## 2019-07-26 ENCOUNTER — Encounter (HOSPITAL_BASED_OUTPATIENT_CLINIC_OR_DEPARTMENT_OTHER)
Admission: RE | Disposition: A | Discharge status: Home / Self Care | Payer: Self-pay | Source: Home / Self Care | Attending: Plastic Surgery

## 2019-07-26 ENCOUNTER — Encounter (HOSPITAL_BASED_OUTPATIENT_CLINIC_OR_DEPARTMENT_OTHER): Payer: Self-pay | Admitting: Plastic Surgery

## 2019-07-26 DIAGNOSIS — Z853 Personal history of malignant neoplasm of breast: Secondary | ICD-10-CM | POA: Insufficient documentation

## 2019-07-26 DIAGNOSIS — Z883 Allergy status to other anti-infective agents status: Secondary | ICD-10-CM | POA: Insufficient documentation

## 2019-07-26 DIAGNOSIS — Z421 Encounter for breast reconstruction following mastectomy: Secondary | ICD-10-CM | POA: Diagnosis not present

## 2019-07-26 DIAGNOSIS — F419 Anxiety disorder, unspecified: Secondary | ICD-10-CM | POA: Insufficient documentation

## 2019-07-26 DIAGNOSIS — Z923 Personal history of irradiation: Secondary | ICD-10-CM | POA: Insufficient documentation

## 2019-07-26 DIAGNOSIS — Z888 Allergy status to other drugs, medicaments and biological substances status: Secondary | ICD-10-CM | POA: Diagnosis not present

## 2019-07-26 DIAGNOSIS — I10 Essential (primary) hypertension: Secondary | ICD-10-CM | POA: Insufficient documentation

## 2019-07-26 DIAGNOSIS — K219 Gastro-esophageal reflux disease without esophagitis: Secondary | ICD-10-CM | POA: Diagnosis not present

## 2019-07-26 DIAGNOSIS — Z9221 Personal history of antineoplastic chemotherapy: Secondary | ICD-10-CM | POA: Diagnosis not present

## 2019-07-26 DIAGNOSIS — Z9011 Acquired absence of right breast and nipple: Secondary | ICD-10-CM | POA: Diagnosis not present

## 2019-07-26 HISTORY — PX: PORT-A-CATH REMOVAL: SHX5289

## 2019-07-26 HISTORY — PX: REMOVAL OF TISSUE EXPANDER AND PLACEMENT OF IMPLANT: SHX6457

## 2019-07-26 HISTORY — PX: LIPOSUCTION WITH LIPOFILLING: SHX6436

## 2019-07-26 SURGERY — REMOVAL, TISSUE EXPANDER, BREAST, WITH IMPLANT INSERTION
Anesthesia: General | Site: Chest | Laterality: Right

## 2019-07-26 MED ORDER — SUGAMMADEX SODIUM 500 MG/5ML IV SOLN
INTRAVENOUS | Status: AC
  Filled 2019-07-26: qty 5

## 2019-07-26 MED ORDER — DEXAMETHASONE SODIUM PHOSPHATE 10 MG/ML IJ SOLN
INTRAMUSCULAR | Status: DC | PRN
  Administered 2019-07-26 (×3): 10 mg via INTRAVENOUS

## 2019-07-26 MED ORDER — MIDAZOLAM HCL 5 MG/5ML IJ SOLN
INTRAMUSCULAR | Status: DC | PRN
  Administered 2019-07-26: 2 mg via INTRAVENOUS

## 2019-07-26 MED ORDER — CELECOXIB 200 MG PO CAPS
200.0000 mg | ORAL_CAPSULE | ORAL | Status: AC
  Administered 2019-07-26: 200 mg via ORAL

## 2019-07-26 MED ORDER — CHLORHEXIDINE GLUCONATE CLOTH 2 % EX PADS: 6.0000 | MEDICATED_PAD | Freq: Once | CUTANEOUS | Status: DC

## 2019-07-26 MED ORDER — LIDOCAINE HCL (CARDIAC) PF 100 MG/5ML IV SOSY
PREFILLED_SYRINGE | INTRAVENOUS | Status: DC | PRN
  Administered 2019-07-26: 60 mg via INTRAVENOUS

## 2019-07-26 MED ORDER — HYDROMORPHONE HCL 1 MG/ML IJ SOLN
0.2500 mg | INTRAMUSCULAR | Status: DC | PRN
  Administered 2019-07-26 (×2): 0.5 mg via INTRAVENOUS

## 2019-07-26 MED ORDER — OXYCODONE HCL 5 MG PO TABS: 5.0000 mg | ORAL_TABLET | Freq: Once | ORAL | Status: DC | PRN

## 2019-07-26 MED ORDER — SODIUM CHLORIDE 0.9 % IV SOLN
INTRAVENOUS | Status: DC | PRN
  Administered 2019-07-26: 1000 mL

## 2019-07-26 MED ORDER — HYDROMORPHONE HCL 1 MG/ML IJ SOLN
INTRAMUSCULAR | Status: AC
  Filled 2019-07-26: qty 0.5

## 2019-07-26 MED ORDER — SUGAMMADEX SODIUM 500 MG/5ML IV SOLN
INTRAVENOUS | Status: DC | PRN
  Administered 2019-07-26: 300 mg via INTRAVENOUS

## 2019-07-26 MED ORDER — SODIUM BICARBONATE 4.2 % IV SOLN
INTRAVENOUS | Status: AC
  Filled 2019-07-26: qty 10

## 2019-07-26 MED ORDER — GABAPENTIN 300 MG PO CAPS
300.0000 mg | ORAL_CAPSULE | ORAL | Status: AC
  Administered 2019-07-26: 300 mg via ORAL

## 2019-07-26 MED ORDER — ACETAMINOPHEN 500 MG PO TABS
ORAL_TABLET | ORAL | Status: AC
  Filled 2019-07-26: qty 2

## 2019-07-26 MED ORDER — PROPOFOL 10 MG/ML IV BOLUS
INTRAVENOUS | Status: AC
  Filled 2019-07-26: qty 40

## 2019-07-26 MED ORDER — ACETAMINOPHEN 500 MG PO TABS
1000.0000 mg | ORAL_TABLET | ORAL | Status: AC
  Administered 2019-07-26: 1000 mg via ORAL

## 2019-07-26 MED ORDER — GABAPENTIN 300 MG PO CAPS
ORAL_CAPSULE | ORAL | Status: AC
  Filled 2019-07-26: qty 1

## 2019-07-26 MED ORDER — EPHEDRINE 5 MG/ML INJ
INTRAVENOUS | Status: AC
  Filled 2019-07-26: qty 10

## 2019-07-26 MED ORDER — LIDOCAINE 2% (20 MG/ML) 5 ML SYRINGE
INTRAMUSCULAR | Status: AC
  Filled 2019-07-26: qty 5

## 2019-07-26 MED ORDER — PROMETHAZINE HCL 25 MG/ML IJ SOLN: 6.2500 mg | INTRAMUSCULAR | Status: DC | PRN

## 2019-07-26 MED ORDER — OXYCODONE HCL 5 MG/5ML PO SOLN: 5.0000 mg | Freq: Once | ORAL | Status: DC | PRN

## 2019-07-26 MED ORDER — FENTANYL CITRATE (PF) 100 MCG/2ML IJ SOLN
INTRAMUSCULAR | Status: AC
  Filled 2019-07-26: qty 4

## 2019-07-26 MED ORDER — SODIUM BICARBONATE 4 % IV SOLN
INTRAVENOUS | Status: DC | PRN
  Administered 2019-07-26: 500 mL via INTRAMUSCULAR

## 2019-07-26 MED ORDER — LACTATED RINGERS IV SOLN: INTRAVENOUS | Status: DC

## 2019-07-26 MED ORDER — ONDANSETRON HCL 4 MG/2ML IJ SOLN
INTRAMUSCULAR | Status: AC
  Filled 2019-07-26: qty 2

## 2019-07-26 MED ORDER — CEFAZOLIN SODIUM-DEXTROSE 2-4 GM/100ML-% IV SOLN
INTRAVENOUS | Status: AC
  Filled 2019-07-26: qty 100

## 2019-07-26 MED ORDER — ROCURONIUM BROMIDE 100 MG/10ML IV SOLN
INTRAVENOUS | Status: DC | PRN
  Administered 2019-07-26: 60 mg via INTRAVENOUS
  Administered 2019-07-26: 20 mg via INTRAVENOUS

## 2019-07-26 MED ORDER — MIDAZOLAM HCL 2 MG/2ML IJ SOLN
INTRAMUSCULAR | Status: AC
  Filled 2019-07-26: qty 2

## 2019-07-26 MED ORDER — FENTANYL CITRATE (PF) 100 MCG/2ML IJ SOLN
INTRAMUSCULAR | Status: DC | PRN
  Administered 2019-07-26 (×2): 100 ug via INTRAVENOUS

## 2019-07-26 MED ORDER — PROPOFOL 10 MG/ML IV BOLUS
INTRAVENOUS | Status: DC | PRN
  Administered 2019-07-26: 150 mg via INTRAVENOUS
  Administered 2019-07-26: 50 mg via INTRAVENOUS

## 2019-07-26 MED ORDER — MEPERIDINE HCL 25 MG/ML IJ SOLN: 6.2500 mg | INTRAMUSCULAR | Status: DC | PRN

## 2019-07-26 MED ORDER — CELECOXIB 200 MG PO CAPS
ORAL_CAPSULE | ORAL | Status: AC
  Filled 2019-07-26: qty 1

## 2019-07-26 MED ORDER — EPHEDRINE SULFATE 50 MG/ML IJ SOLN
INTRAMUSCULAR | Status: DC | PRN
  Administered 2019-07-26: 10 mg via INTRAVENOUS

## 2019-07-26 MED ORDER — ONDANSETRON HCL 4 MG/2ML IJ SOLN
INTRAMUSCULAR | Status: DC | PRN
  Administered 2019-07-26: 4 mg via INTRAVENOUS

## 2019-07-26 MED ORDER — CEFAZOLIN SODIUM-DEXTROSE 2-4 GM/100ML-% IV SOLN
2.0000 g | INTRAVENOUS | Status: AC
  Administered 2019-07-26: 2 g via INTRAVENOUS

## 2019-07-26 MED ORDER — DEXAMETHASONE SODIUM PHOSPHATE 10 MG/ML IJ SOLN
INTRAMUSCULAR | Status: AC
  Filled 2019-07-26: qty 1

## 2019-07-26 SURGICAL SUPPLY — 102 items
ADH SKN CLS APL DERMABOND .7 (GAUZE/BANDAGES/DRESSINGS) ×6
APL PRP STRL LF DISP 70% ISPRP (MISCELLANEOUS) ×4
APL SKNCLS STERI-STRIP NONHPOA (GAUZE/BANDAGES/DRESSINGS)
BAG DECANTER FOR FLEXI CONT (MISCELLANEOUS) ×4 IMPLANT
BENZOIN TINCTURE PRP APPL 2/3 (GAUZE/BANDAGES/DRESSINGS) IMPLANT
BINDER ABDOMINAL 10 UNV 27-48 (MISCELLANEOUS) ×4 IMPLANT
BINDER ABDOMINAL 12 SM 30-45 (SOFTGOODS) IMPLANT
BINDER BREAST 3XL (GAUZE/BANDAGES/DRESSINGS) IMPLANT
BINDER BREAST LRG (GAUZE/BANDAGES/DRESSINGS) ×4 IMPLANT
BINDER BREAST MEDIUM (GAUZE/BANDAGES/DRESSINGS) IMPLANT
BINDER BREAST XLRG (GAUZE/BANDAGES/DRESSINGS) IMPLANT
BINDER BREAST XXLRG (GAUZE/BANDAGES/DRESSINGS) IMPLANT
BLADE CLIPPER SURG (BLADE) IMPLANT
BLADE SURG 10 STRL SS (BLADE) ×8 IMPLANT
BLADE SURG 11 STRL SS (BLADE) ×4 IMPLANT
BLADE SURG 15 STRL LF DISP TIS (BLADE) ×3 IMPLANT
BLADE SURG 15 STRL SS (BLADE) ×4
BNDG GAUZE ELAST 4 BULKY (GAUZE/BANDAGES/DRESSINGS) ×8 IMPLANT
CANISTER LIPO FAT HARVEST (MISCELLANEOUS) IMPLANT
CANISTER SUCT 1200ML W/VALVE (MISCELLANEOUS) ×4 IMPLANT
CHLORAPREP W/TINT 26 (MISCELLANEOUS) ×8 IMPLANT
COVER BACK TABLE REUSABLE LG (DRAPES) ×4 IMPLANT
COVER MAYO STAND REUSABLE (DRAPES) ×8 IMPLANT
COVER WAND RF STERILE (DRAPES) IMPLANT
DECANTER SPIKE VIAL GLASS SM (MISCELLANEOUS) IMPLANT
DERMABOND ADVANCED (GAUZE/BANDAGES/DRESSINGS) ×2
DERMABOND ADVANCED .7 DNX12 (GAUZE/BANDAGES/DRESSINGS) ×6 IMPLANT
DRAIN CHANNEL 15F RND FF W/TCR (WOUND CARE) IMPLANT
DRAPE HALF SHEET 70X43 (DRAPES) ×8 IMPLANT
DRAPE LAPAROTOMY 100X72 PEDS (DRAPES) IMPLANT
DRAPE TOP ARMCOVERS (MISCELLANEOUS) ×4 IMPLANT
DRAPE U-SHAPE 76X120 STRL (DRAPES) ×4 IMPLANT
DRAPE UTILITY XL STRL (DRAPES) ×4 IMPLANT
DRSG PAD ABDOMINAL 8X10 ST (GAUZE/BANDAGES/DRESSINGS) ×8 IMPLANT
ELECT BLADE 4.0 EZ CLEAN MEGAD (MISCELLANEOUS) ×4
ELECT COATED BLADE 2.86 ST (ELECTRODE) ×4 IMPLANT
ELECT REM PT RETURN 9FT ADLT (ELECTROSURGICAL) ×4
ELECTRODE BLDE 4.0 EZ CLN MEGD (MISCELLANEOUS) ×3 IMPLANT
ELECTRODE REM PT RTRN 9FT ADLT (ELECTROSURGICAL) ×3 IMPLANT
EVACUATOR SILICONE 100CC (DRAIN) IMPLANT
EXTRACTOR CANIST REVOLVE STRL (CANNISTER) IMPLANT
GAUZE SPONGE 4X4 12PLY STRL LF (GAUZE/BANDAGES/DRESSINGS) IMPLANT
GLOVE BIO SURGEON STRL SZ 6 (GLOVE) ×8 IMPLANT
GLOVE BIO SURGEON STRL SZ7.5 (GLOVE) ×4 IMPLANT
GLOVE BIOGEL PI IND STRL 7.0 (GLOVE) ×3 IMPLANT
GLOVE BIOGEL PI INDICATOR 7.0 (GLOVE) ×1
GOWN STRL REUS W/ TWL LRG LVL3 (GOWN DISPOSABLE) ×6 IMPLANT
GOWN STRL REUS W/TWL LRG LVL3 (GOWN DISPOSABLE) ×8
IMPL BREAST SALINE 330CC (Breast) ×3 IMPLANT
IMPLANT BREAST SALINE 330CC (Breast) ×4 IMPLANT
IV NS 500ML (IV SOLUTION) ×3
IV NS 500ML BAXH (IV SOLUTION) ×3 IMPLANT
KIT FILL SYSTEM UNIVERSAL (SET/KITS/TRAYS/PACK) IMPLANT
LINER CANISTER 1000CC FLEX (MISCELLANEOUS) ×4 IMPLANT
MARKER SKIN DUAL TIP RULER LAB (MISCELLANEOUS) IMPLANT
NDL SAFETY ECLIPSE 18X1.5 (NEEDLE) ×3 IMPLANT
NEEDLE FILTER BLUNT 18X 1/2SAF (NEEDLE) ×1
NEEDLE FILTER BLUNT 18X1 1/2 (NEEDLE) ×3 IMPLANT
NEEDLE HYPO 18GX1.5 SHARP (NEEDLE) ×3
NEEDLE HYPO 25X1 1.5 SAFETY (NEEDLE) IMPLANT
NEEDLE HYPO 30GX1 BEV (NEEDLE) IMPLANT
NEEDLE PRECISIONGLIDE 27X1.5 (NEEDLE) ×4 IMPLANT
NS IRRIG 1000ML POUR BTL (IV SOLUTION) ×4 IMPLANT
PACK BASIN DAY SURGERY FS (CUSTOM PROCEDURE TRAY) ×4 IMPLANT
PAD ALCOHOL SWAB (MISCELLANEOUS) ×4 IMPLANT
PENCIL SMOKE EVACUATOR (MISCELLANEOUS) ×4 IMPLANT
PIN SAFETY STERILE (MISCELLANEOUS) ×4 IMPLANT
PUNCH BIOPSY DERMAL 4MM (MISCELLANEOUS) IMPLANT
SIZER BREAST REUSE 304CC (SIZER) ×4 IMPLANT
SIZER BREAST REUSE 339CC (SIZER) ×4 IMPLANT
SIZER BREAST REUSE 350CC (SIZER) ×4 IMPLANT
SIZER BREAST REUSE 400CC (SIZER) ×3
SIZER BRST REUSE STY 20 400CC (SIZER) ×3 IMPLANT
SLEEVE SCD COMPRESS KNEE MED (MISCELLANEOUS) ×4 IMPLANT
SPONGE GAUZE 2X2 8PLY STRL LF (GAUZE/BANDAGES/DRESSINGS) IMPLANT
SPONGE LAP 18X18 RF (DISPOSABLE) ×12 IMPLANT
STAPLER VISISTAT 35W (STAPLE) ×4 IMPLANT
STRIP CLOSURE SKIN 1/2X4 (GAUZE/BANDAGES/DRESSINGS) IMPLANT
SUT ETHILON 2 0 FS 18 (SUTURE) IMPLANT
SUT MNCRL AB 4-0 PS2 18 (SUTURE) ×4 IMPLANT
SUT PDS AB 2-0 CT2 27 (SUTURE) IMPLANT
SUT VIC AB 3-0 PS1 18 (SUTURE)
SUT VIC AB 3-0 PS1 18XBRD (SUTURE) IMPLANT
SUT VIC AB 3-0 SH 27 (SUTURE) ×3
SUT VIC AB 3-0 SH 27X BRD (SUTURE) ×3 IMPLANT
SUT VICRYL 4-0 PS2 18IN ABS (SUTURE) ×8 IMPLANT
SYR 10ML LL (SYRINGE) ×12 IMPLANT
SYR 20ML LL LF (SYRINGE) IMPLANT
SYR 50ML LL SCALE MARK (SYRINGE) ×4 IMPLANT
SYR BULB 3OZ (MISCELLANEOUS) IMPLANT
SYR BULB IRRIGATION 50ML (SYRINGE) ×8 IMPLANT
SYR CONTROL 10ML LL (SYRINGE) ×4 IMPLANT
SYR TB 1ML LL NO SAFETY (SYRINGE) ×4 IMPLANT
SYR TOOMEY IRRIG 70ML (MISCELLANEOUS)
SYRINGE TOOMEY IRRIG 70ML (MISCELLANEOUS) IMPLANT
TOWEL GREEN STERILE FF (TOWEL DISPOSABLE) ×8 IMPLANT
TRAY DSU PREP LF (CUSTOM PROCEDURE TRAY) IMPLANT
TUBE CONNECTING 20X1/4 (TUBING) ×8 IMPLANT
TUBING INFILTRATION IT-10001 (TUBING) ×4 IMPLANT
TUBING SET GRADUATE ASPIR 12FT (MISCELLANEOUS) ×4 IMPLANT
UNDERPAD 30X36 HEAVY ABSORB (UNDERPADS AND DIAPERS) ×8 IMPLANT
YANKAUER SUCT BULB TIP NO VENT (SUCTIONS) ×4 IMPLANT

## 2019-07-26 NOTE — Discharge Instructions (Signed)
°  Post Anesthesia Home Care Instructions  Activity: Get plenty of rest for the remainder of the day. A responsible individual must stay with you for 24 hours following the procedure.  For the next 24 hours, DO NOT: -Drive a car -Paediatric nurse -Drink alcoholic beverages -Take any medication unless instructed by your physician -Make any legal decisions or sign important papers.  Meals: Start with liquid foods such as gelatin or soup. Progress to regular foods as tolerated. Avoid greasy, spicy, heavy foods. If nausea and/or vomiting occur, drink only clear liquids until the nausea and/or vomiting subsides. Call your physician if vomiting continues.  Special Instructions/Symptoms: Your throat may feel dry or sore from the anesthesia or the breathing tube placed in your throat during surgery. If this causes discomfort, gargle with warm salt water. The discomfort should disappear within 24 hours.  If you had a scopolamine patch placed behind your ear for the management of post- operative nausea and/or vomiting:  1. The medication in the patch is effective for 72 hours, after which it should be removed.  Wrap patch in a tissue and discard in the trash. Wash hands thoroughly with soap and water. 2. You may remove the patch earlier than 72 hours if you experience unpleasant side effects which may include dry mouth, dizziness or visual disturbances. 3. Avoid touching the patch. Wash your hands with soap and water after contact with the patch.    No tylenol until 1245pm No ibuprofen until 245pm

## 2019-07-26 NOTE — Anesthesia Procedure Notes (Signed)
Procedure Name: Intubation Date/Time: 07/26/2019 7:46 AM Performed by: Jonna Munro, CRNA Pre-anesthesia Checklist: Patient identified, Emergency Drugs available, Suction available, Patient being monitored and Timeout performed Patient Re-evaluated:Patient Re-evaluated prior to induction Oxygen Delivery Method: Circle system utilized Preoxygenation: Pre-oxygenation with 100% oxygen Induction Type: IV induction Ventilation: Mask ventilation without difficulty Laryngoscope Size: Glidescope, 3 and Mac Grade View: Grade III Tube type: Oral Tube size: 7.0 mm Number of attempts: 3 Airway Equipment and Method: Stylet and Video-laryngoscopy Placement Confirmation: positive ETCO2,  ETT inserted through vocal cords under direct vision and breath sounds checked- equal and bilateral Secured at: 22 cm Tube secured with: Tape Dental Injury: Teeth and Oropharynx as per pre-operative assessment  Difficulty Due To: Difficult Airway- due to anterior larynx Comments: DL x 1 by S.Aliviah Spain CRNA, unable to pass ETT, Dr, Sabra Heck DL unable to see cords, bougie used, ETT in esophagus, ETT removed, DL with glidescope where cords were visualized and ETT placed in correct placement. Placement confirmed with equal bilateral breath sounds, positive ETCO2, equal chest rise. Patient's VS and O2 sat. Remained stable throughout intubation.

## 2019-07-26 NOTE — Op Note (Signed)
Operative Note   DATE OF OPERATION: 12.29.20  LOCATION: [facility]-outpatient  SURGICAL DIVISION: Plastic Surgery  PREOPERATIVE DIAGNOSES:  1. History breast cancer 2. Acquired absence breast 3. History therapeutic radiation  POSTOPERATIVE DIAGNOSES:  same  PROCEDURE:  1. Removal left chest port 2. Removal right chest tissue expander and placement saline implant 3. Lipofilling to right chest 90 ml  SURGEON: Irene Limbo MD MBA  ASSISTANT: none  ANESTHESIA:  General.   EBL: 30 ml  COMPLICATIONS: None immediate.   INDICATIONS FOR PROCEDURE:  The patient, Claire Guzman, is a 53 y.o. female born on 04/18/1966, is here for staged breast reconstruction following right nipple sparing mastectomy with immediate prepectoral expander acellular dermis reconstruction. She received adjuvant chemotherapy and radiation.   FINDINGS: Complete incorporation acellular dermis noted. Natrelle Smooth Round Moderate Profile Saline implant placed, 330 ml filled to 350 ml. Style 68MP REF 68-330 SN [id number]  DESCRIPTION OF PROCEDURE:  The patient's operative site was marked with the patient in the preoperative area to mark sternal notch, chest midline, anterior axillary lines.Supra and infraumbilcal abdomen marked for liposuction.The patientwas taken to the operating room. SCDs were placed and IV antibiotics were given. The patient's operative site was prepped and draped in a sterile fashion.  Incision made over left chest scar over port. Incision carried through superficial fascia and capsule. Sutures removed and port removed intact. Closure completed with 4-0 vicryl in superficial fascia and dermis followed by 4-0 monocryl subcuticular skin closure.   I then directed attention toright chest. Incision made in inframammary fold scar, carried through superficial fascia and ADM. Expander removed and well incorporated ADM noted.Capsulotomies performed superiorly, medially, and over lower  outer quadrant. Sizer placed and patient brought to upright sitting position. Natrelle Smooth RoundModerate Projection374mlimplant selected for placement.The patient was returned to supine position.  Stab incision made over bilateralabdomenand tumescent fluid infiltrated over supra and infraumbilical 0000000 ml tumescent infiltrated. Power assisted liposuction performed to endpoint symmetric contour and soft tissue thickness, total lipoaspirate372ml. The fat was then washed and prepared by gravity for infiltration. Harvested fat was then infiltrated in subcutaneous plane throughout total envelope mastectomy flap, total 90 ml.  Cavity irrigated with bacitracin, Ancef, gentamicinsolution.Hemostasis ensured. Cavity then irrigated with Betadinesaline solution. The implant was placed inrightchest andimplant orientation ensured. Patient filled to 350 ml. Fill tubing removed and tab closure ensured. Closure completed with 3-0 vicryl to closesuperficial fascia and ADMover implant.4-0 vicryl used to close dermis followed by 4-0 monocryl subcuticular.Abdomen incisions approximated with simple 4-0 monocryl stitch.Tissue adhesiveapplied to chest incisions. Dry dressing applied, followed by breastbinderand abdominal compression garment.  The patient was allowed to wake from anesthesia, extubated and taken to the recovery room in satisfactory condition.   SPECIMENS: none  DRAINS: none  Irene Limbo, MD [facility] Plastic & Reconstructive Surgery

## 2019-07-26 NOTE — Transfer of Care (Signed)
Immediate Anesthesia Transfer of Care Note  Patient: Claire Guzman  Procedure(s) Performed: REMOVAL OF TISSUE EXPANDER AND PLACEMENT OF IMPLANT (Right Breast) LIPOFILLING from abdomen to R chest (Right Chest) REMOVAL PORT-A-CATH (Left Chest)  Patient Location: PACU  Anesthesia Type:General  Level of Consciousness: awake, alert , oriented and patient cooperative  Airway & Oxygen Therapy: Patient Spontanous Breathing and Patient connected to nasal cannula oxygen  Post-op Assessment: Report given to RN, Post -op Vital signs reviewed and stable and Patient moving all extremities X 4  Post vital signs: Reviewed and stable  Last Vitals:  Vitals Value Taken Time  BP    Temp    Pulse 71 07/26/19 1002  Resp    SpO2 100 % 07/26/19 1002  Vitals shown include unvalidated device data.  Last Pain:  Vitals:   07/26/19 0639  TempSrc: Tympanic  PainSc: 0-No pain         Complications: No apparent anesthesia complications

## 2019-07-26 NOTE — Anesthesia Preprocedure Evaluation (Signed)
Anesthesia Evaluation  Patient identified by MRN, date of birth, ID band Patient awake    Reviewed: Allergy & Precautions, H&P , NPO status , Patient's Chart, lab work & pertinent test results, reviewed documented beta blocker date and time   Airway Mallampati: II  TM Distance: >3 FB Neck ROM: Full    Dental no notable dental hx. (+) Teeth Intact, Dental Advisory Given   Pulmonary neg pulmonary ROS,    Pulmonary exam normal breath sounds clear to auscultation       Cardiovascular Exercise Tolerance: Good hypertension, Pt. on medications and Pt. on home beta blockers  Rhythm:Regular Rate:Normal     Neuro/Psych Anxiety negative neurological ROS     GI/Hepatic Neg liver ROS, GERD  Medicated and Controlled,  Endo/Other  negative endocrine ROS  Renal/GU negative Renal ROS  negative genitourinary   Musculoskeletal   Abdominal   Peds  Hematology negative hematology ROS (+)   Anesthesia Other Findings   Reproductive/Obstetrics negative OB ROS                             Anesthesia Physical  Anesthesia Plan  ASA: II  Anesthesia Plan: General   Post-op Pain Management:    Induction: Intravenous  PONV Risk Score and Plan: 3 and Ondansetron, Dexamethasone, Midazolam and Treatment may vary due to age or medical condition  Airway Management Planned: Oral ETT  Additional Equipment:   Intra-op Plan:   Post-operative Plan: Extubation in OR  Informed Consent: I have reviewed the patients History and Physical, chart, labs and discussed the procedure including the risks, benefits and alternatives for the proposed anesthesia with the patient or authorized representative who has indicated his/her understanding and acceptance.     Dental advisory given  Plan Discussed with: CRNA  Anesthesia Plan Comments: (PAT note written by Myra Gianotti, PA-C. )        Anesthesia Quick  Evaluation

## 2019-07-26 NOTE — Interval H&P Note (Signed)
History and Physical Interval Note:  07/26/2019 7:00 AM  Claire Guzman  has presented today for surgery, with the diagnosis of History breast cancer acquired absence breast history therapeutic radiation.  The various methods of treatment have been discussed with the patient and family. After consideration of risks, benefits and other options for treatment, the patient has consented to removal right chest tissue expander placement saline implant lipofilling to right chest from abdomen removal left chest port as a surgical intervention.  The patient's history has been reviewed, patient examined, no change in status, stable for surgery.  I have reviewed the patient's chart and labs.  Questions were answered to the patient's satisfaction.     Arnoldo Hooker [place]itt

## 2019-07-26 NOTE — Anesthesia Postprocedure Evaluation (Signed)
Anesthesia Post Note  Patient: Gracie Pano  Procedure(s) Performed: REMOVAL OF TISSUE EXPANDER AND PLACEMENT OF IMPLANT (Right Breast) LIPOFILLING from abdomen to R chest (Right Chest) REMOVAL PORT-A-CATH (Left Chest)     Patient location during evaluation: PACU Anesthesia Type: General Level of consciousness: awake and alert Pain management: pain level controlled Vital Signs Assessment: post-procedure vital signs reviewed and stable Respiratory status: spontaneous breathing, nonlabored ventilation and respiratory function stable Cardiovascular status: blood pressure returned to baseline and stable Postop Assessment: no apparent nausea or vomiting Anesthetic complications: no    Last Vitals:  Vitals:   07/26/19 1109 07/26/19 1130  BP: 126/76 (!) 146/68  Pulse: 69 83  Resp: 12 16  Temp:  36.6 C  SpO2: 97% 100%    Last Pain:  Vitals:   07/26/19 1130  TempSrc:   PainSc: 2                  Lynda Rainwater

## 2019-07-27 NOTE — Progress Notes (Signed)
Patient instructed to get help immediately if she has any trouble breathing or throat swelling or if rash appears to get any worse.

## 2019-08-08 NOTE — Progress Notes (Signed)
[facility]   [phone]) [phone] [phone]) [phone]   Clinic Follow up Note   Patient Care Team: Lujean Amel, MD as PCP - General (Family Medicine) Fanny Skates, MD as Consulting Physician (General Surgery) Truitt Merle, MD as Consulting Physician (Hematology) Eppie Gibson, MD as Attending Physician (Radiation Oncology) Mauro Kaufmann, RN as Oncology Nurse Navigator Rockwell Germany, RN as Oncology Nurse Navigator   I connected with Boykin Peek on 08/11/2019 at 10:40 AM EST by telephone visit and verified that I am speaking with the correct person using two identifiers.  I discussed the limitations, risks, security and privacy concerns of performing an evaluation and management service by telephone and the availability of in person appointments. I also discussed with the patient that there may be a patient responsible charge related to this service. The patient expressed understanding and agreed to proceed.   Patient's location:  Work Provider's location:  My Office  CHIEF COMPLAINT: F/u for right breast cancer  SUMMARY OF ONCOLOGIC HISTORY: Oncology History Overview Note  Cancer Staging Malignant neoplasm of upper-inner quadrant of right breast in female, estrogen receptor positive ([place]) Staging form: Breast, AJCC 8th Edition - Clinical stage from 03/08/2018: Stage IB (cT2, cN1, cM0, G3, ER+, PR+, HER2+) - Signed by Truitt Merle, MD on 03/16/2018     Malignant neoplasm of upper-inner quadrant of right breast in female, estrogen receptor positive ([place])  02/24/2018 Mammogram   screening mammogram on 02/24/2018 that was benign   03/04/2018 Mammogram   diagnostic mammogram on 03/04/2018 due to a palpable mass on her right breast. Diagnostic mammogram, breast US revealed and biopsy revealed suspicious mass in the right breast at 2:30 at the palpable site of concern and one suspicious right axillary LN.   03/08/2018 Cancer Staging   Staging form: Breast, AJCC  8th Edition - Clinical stage from 03/08/2018: Stage IB (cT2, cN1, cM0, G3, ER+, PR+, HER2+) - Signed by Truitt Merle, MD on 03/16/2018   03/08/2018 Receptors her2   Her2 positive, ER 90% PR 5% and Ki67 60%   03/08/2018 Pathology Results   Diagnosis 1. Breast, right, needle core biopsy, 2:30 - INVASIVE DUCTAL CARCINOMA, GRADE 3. - LYMPHOVASCULAR SPACE INVOLVEMENT BY TUMOR. 2. Lymph node, needle/core biopsy, right axilla - METASTATIC CARCINOMA INVOLVING SCANT LYMPHOID TISSUE.   03/10/2018 Initial Diagnosis   Malignant neoplasm of upper-inner quadrant of right breast in female, estrogen receptor positive ([place])   03/20/2018 Imaging   MRI brain 03/20/18 IMPRESSION: No evidence of metastatic disease.  Normal appearance of brain. Slightly heterogeneous marrow pattern of the upper cervical spine, nonspecific but likely benign, especially in the setting of early stage disease.    03/24/2018 Echocardiogram   Baseline ECHO 03/24/18 Study Conclusions - Left ventricle: The cavity size was normal. Wall thickness was   increased in a pattern of mild LVH. Systolic function was normal.   The estimated ejection fraction was in the range of 60% to 65%.   Wall motion was normal; there were no regional wall motion   abnormalities. Doppler parameters are consistent with abnormal   left ventricular relaxation (grade 1 diastolic dysfunction). - Right ventricle: The cavity size was mildly dilated. Wall   thickness was normal.   03/25/2018 Imaging   MRI Breast B/l 03/25/18 IMPRESSION: 1. The patient's primary malignancy in the posterior right breast measures 3.2 x 2 x 2.6 cm with apparent invasion of the underlying pectoralis muscle. Numerous surrounding abnormal enhancing masses, consistent with satellite lesions, extend from 11 o'clock  in the right breast into the inferolateral right breast with a total span of suspected disease measuring 4.5 x 4.6 x 6.4 cm in AP, transverse, and craniocaudal dimension. The  suspected extent of disease involves the superior and inferolateral quadrants. The primary malignancy also extends just across midline into the superior medial quadrant. 2. No MRI evidence of malignancy in the left breast. 3. Known metastatic noted in the right axilla.   03/25/2018 Imaging   Whole body bone scan 03/25/18 IMPRESSION: No scintigraphic evidence skeletal metastasis.   03/25/2018 Imaging   CT CAP W Contrast 03/25/18 IMPRESSION: 1. Mass within the deep aspect of the right breast along the pectoralis muscle compatible with primary breast malignancy. 2. There is suggestion of two additional possible masses within the lateral aspect of the right breast versus nodular appearing breast tissue. Recommend dedicated evaluation with bilateral breast MRI. 3. Mildly thickened right axillary lymph node compatible with metastatic adenopathy, recently biopsied. 4. Multiple bilateral pulmonary nodules are indeterminate, potentially sequelae of prior infectious/inflammatory process. Recommend attention on follow-up. Consider follow-up CT in 6 months. 5. Portal venous gas is demonstrated within the left hepatic lobe. Additionally, there is wall thickening of the cecum and ascending colon with small amount of gas within the pericolonic vasculature. Constellation of findings is indeterminate in etiology however may be secondary to colitis at this location with considerations including infectious, inflammatory or ischemic etiologies. Correlate for recent procedure. If patient is not up-to-date for colonic screening, recommend further evaluation with colonoscopy to exclude the possibility of colonic mass within the cecum/ascending colon. 6. These results were called by telephone at the time of interpretation on 03/25/2018 at 9:31 am to Dr. Truitt Merle , who verbally acknowledged these results.   03/30/2018 - 04/08/2019 Chemotherapy   Neoadjuvant TCHP every 3 weeks starting 03/30/18. Completed 6 cycles  of TCHP on 07/23/18.  Continued with maintenance Herceptin/Perjeta q3weeks to complete 1 year.    04/09/2018 Genetic Testing   Negative genetic testing on the [employer] Common Hereditary Cancers Panel. The Common Hereditary Cancers Panel offered by [employer] includes sequencing and/or deletion duplication testing of the following 47 genes: APC, ATM, AXIN2, BARD1, BMPR1A, BRCA1, BRCA2, BRIP1, CDH1, CDKN2A (p14ARF), CDKN2A (p16INK4a), CKD4, CHEK2, CTNNA1, DICER1, EPCAM (Deletion/duplication testing only), GREM1 (promoter region deletion/duplication testing only), KIT, MEN1, MLH1, MSH2, MSH3, MSH6, MUTYH, NBN, NF1, NHTL1, PALB2, PDGFRA, PMS2, POLD1, POLE, PTEN, RAD50, RAD51C, RAD51D, SDHB, SDHC, SDHD, SMAD4, SMARCA4. STK11, TP53, TSC1, TSC2, and VHL.  The following genes were evaluated for sequence changes only: SDHA and HOXB13 c.251G>A variant only.   Genetic testing did detect a Variant of Unknown Significance (VUS) in the POLD1 gene called c.985C>T (p.Pro329Ser). At this time, it is unknown if this variant is associated with increased cancer risk or if this is a normal finding, but most variants such as this get reclassified to being inconsequential. It should not be used to make medical management decisions.   The report date is 04/09/2018.   07/24/2018 Breast MRI   IMPRESSION: 1. Resolution of the enhancing mass (known cancer) and small satellite lesions previously seen in the right breast following chemotherapy.  2. The metastatic lymph node in the right axilla has decreased in size.  3.  No MRI evidence of left breast malignancy.   09/14/2018 Surgery   RIGHT NIPPLE SPARING MASTECTOMY WITH TARGETED DEEP RIGHT AXILLARY LYMPH NODE BIOPSY WITH RADIOACTIVE SEED LOCALIZATION AND RIGHT AXILLARY SENTINEL LYMPH NODE BIOPSY by Dr. Dalbert Batman  09/14/18    09/14/2018 Pathology Results  Diagnosis 1. Lymph node, sentinel, biopsy, right axillary with radioactive seed - ONE OF ONE LYMPH NODES NEGATIVE FOR  CARCINOMA (0/1). - MILD TREATMENT EFFECT. - BIOPSY SITE. 2. Lymph node, sentinel, biopsy, right axillary - ONE OF ONE LYMPH NODES NEGATIVE FOR CARCINOMA (0/1). 3. Lymph node, sentinel, biopsy, right - ONE OF ONE LYMPH NODES NEGATIVE FOR CARCINOMA (0/1). 4. Lymph node, sentinel, biopsy, right - ONE OF ONE LYMPH NODES NEGATIVE FOR CARCINOMA (0/1). 5. Lymph node, sentinel, biopsy, right - ONE OF ONE LYMPH NODES NEGATIVE FOR CARCINOMA (0/1). 6. Lymph node, sentinel, biopsy, right - ONE OF ONE LYMPH NODES NEGATIVE FOR CARCINOMA (0/1). 7. Lymph node, sentinel, biopsy, right - ONE OF ONE LYMPH NODES NEGATIVE FOR CARCINOMA (0/1). 8. Lymph node, sentinel, biopsy, right - ONE OF ONE LYMPH NODES NEGATIVE FOR CARCINOMA (0/1). 9. Lymph node, sentinel, biopsy, right - ONE OF ONE LYMPH NODES NEGATIVE FOR CARCINOMA (0/1). 10. Lymph node, sentinel, biopsy, right - ONE OF ONE LYMPH NODES NEGATIVE FOR CARCINOMA (0/1). 11. Lymph node, sentinel, biopsy, right - ONE OF ONE LYMPH NODES NEGATIVE FOR CARCINOMA (0/1). 12. Lymph node, sentinel, biopsy, right - ONE OF ONE LYMPH NODES NEGATIVE FOR CARCINOMA (0/1). 13. Nipple Biopsy, right - BENIGN NIPPLE TISSUE. 14. Breast, simple mastectomy, right - NO RESIDUAL CARCINOMA IDENTIFIED. - TREATMENT EFFECT. - FIBROCYSTIC CHANGE. - ONE OF ONE LYMPH NODES NEGATIVE FOR CARCINOMA (0/1). - SEE ONCOLOGY TABLE.   09/14/2018 Cancer Staging   Staging form: Breast, AJCC 8th Edition - Pathologic stage from 09/14/2018: No Stage Recommended (ypT0, pN0, cM0, GX, ER: Not Assessed, PR: Not Assessed, HER2: Not Assessed) - Signed by Truitt Merle, MD on 09/23/2018   11/03/2018 - 12/10/2018 Radiation Therapy   Adjuvant Radiation with Dr. Isidore Moos    12/27/2018 -  Anti-estrogen oral therapy   Start Tamoxifen 60m daily in 12/27/2018 stopped after 1 week due to poor toleration. She started anastrozole on 02/04/19 but stopped on 02/14/19 due to worsening muscle aches. She tried Letrozole but did  not tolerate. She was switched to Exemestane in late 04/2019.    01/04/2019 Imaging   CT chest  IMPRESSION: 1. Stable tiny subpleural and perifissural nodules bilaterally. No suspicious pulmonary nodule or mass. 2. No focal airspace disease in the right lung. 3.  Aortic Atherosclerois (ICD10-170.0)     03/08/2019 Imaging   CT AP W Contrast 03/08/19  IMPRESSION: 1. No metastatic disease in the abdomen pelvis. 2. No evidence adverse immunotherapy therapy reaction.   03/08/2019 Imaging   Whole body bone scan 03/08/19  IMPRESSION: No definite scintigraphic evidence of osseous metastases.      CURRENT THERAPY:  -Tamoxifen 267maily starting 6/1/2020stopped after 1 week due to poor toleration.She started anastrozole on 02/04/19 but stopped on 02/14/19 due to worsening muscle aches.She tried Letrozole but did not tolerate.She was switched to Exemestane in late10/2020. -PENDING start of Nerlynx  INTERVAL HISTORY:  JeSuanne Minahans here for a follow up.   She notes she is doing well. She has been taking exemestane through mail order and it took longer to receive her refill. She notes she has medicaid now. She notes she still has joint pian in the morning until she gets up and move. So far this is the most tolerable. She notes her hot flashes are mostly manageable. 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 She notes her right breast lymphedema has improved and she did not proceed with PT.   REVIEW OF SYSTEMS:   Constitutional: Denies fevers, chills or  abnormal weight loss (+) Manageable hot flashes  Eyes: Denies blurriness of vision Ears, nose, mouth, throat, and face: Denies mucositis or sore throat Respiratory: Denies cough, dyspnea or wheezes Cardiovascular: Denies palpitation, chest discomfort or lower extremity swelling Gastrointestinal:  Denies nausea, heartburn or change in bowel habits Skin: Denies abnormal skin rashes MSK: (+) Improved joint pain  Lymphatics: Denies  new lymphadenopathy or easy bruising Neurological: (+) Right hand neuropathy has improved.  Behavioral/Psych: Mood is stable, no new changes  All other systems were reviewed with the patient and are negative.  MEDICAL HISTORY:  Past Medical History:  Diagnosis Date  . Abdominal pain, unspecified site   . Anxiety state, unspecified   . Complication of anesthesia    convulsions - when she has appendectomy about 12 years ago at [facility]  . Contact dermatitis and other eczema, due to unspecified cause   . Essential hypertension, benign   . Family history of colon cancer   . Family history of melanoma   . Family history of prostate cancer   . GERD (gastroesophageal reflux disease)   . Hematuria, unspecified   . Hypertension   . Lower back pain   . Mixed hyperlipidemia   . Rosacea   . rt breast ca dx'd 01/2018   R breast cancer  . Unspecified symptom associated with female genital organs     SURGICAL HISTORY: Past Surgical History:  Procedure Laterality Date  . BREAST RECONSTRUCTION WITH PLACEMENT OF TISSUE EXPANDER AND ALLODERM Right 09/14/2018   Procedure: BREAST RECONSTRUCTION WITH PLACEMENT OF TISSUE EXPANDER AND ALLODERM;  Surgeon: Irene Limbo, MD;  Location: [place];  Service: Plastics;  Laterality: Right;  . CESAREAN SECTION    . DIAGNOSTIC LAPAROSCOPY    . EYE SURGERY     lasix  . LAPAROSCOPIC APPENDECTOMY    . LIPOSUCTION WITH LIPOFILLING Right 07/26/2019   Procedure: LIPOFILLING from abdomen to R chest;  Surgeon: Irene Limbo, MD;  Location: [place];  Service: Plastics;  Laterality: Right;  . MASTECTOMY WITH RADIOACTIVE SEED GUIDED EXCISION AND AXILLARY SENTINEL LYMPH NODE BIOPSY Right 09/14/2018   Procedure: RIGHT NIPPLE SPARING MASTECTOMY WITH TARGETED DEEP RIGHT AXILLARY LYMPH NODE BIOPSY WITH RADIOACTIVE SEED LOCALIZATION AND RIGHT AXILLARY SENTINEL LYMPH NODE BIOPSY, FROZEN SECTION AND POSSIBLE COMPLETION AXILLARY LYMPH NODE DISSECTION  INJECT BLUE DYE RIGHT BREAST;  Surgeon: Fanny Skates, MD;  Location: [place];  Service: General;  Laterality: Right;  . PORT-A-CATH REMOVAL Left 07/26/2019   Procedure: REMOVAL PORT-A-CATH;  Surgeon: Irene Limbo, MD;  Location: [place];  Service: Plastics;  Laterality: Left;  . PORTACATH PLACEMENT Left 04/13/2018   Procedure: INSERTION PORT-A-CATH WITH [place];  Surgeon: Fanny Skates, MD;  Location: [place];  Service: General;  Laterality: Left;  . REMOVAL OF TISSUE EXPANDER AND PLACEMENT OF IMPLANT Right 07/26/2019   Procedure: REMOVAL OF TISSUE EXPANDER AND PLACEMENT OF IMPLANT;  Surgeon: Irene Limbo, MD;  Location: [place];  Service: Plastics;  Laterality: Right;  . UTERINE FIBROID SURGERY      I have reviewed the social history and family history with the patient and they are unchanged from previous note.  ALLERGIES:  is allergic to metrogel [metronidazole]; other; paxil [paroxetine hcl]; prednisone; and zomig [zolmitriptan].  MEDICATIONS:  Current Outpatient Medications  Medication Sig Dispense Refill  . ALPRAZolam (XANAX) 0.5 MG tablet TAKE 1 TABLET(0.5 MG) BY MOUTH AT BEDTIME AS NEEDED FOR ANXIETY 30 tablet 0  . atenolol (TENORMIN) 50 MG tablet  Take 50 mg by mouth at bedtime.    . Budesonide ER 9 MG CP24 Take 9 mg by mouth daily. 30 capsule 0  . cyclobenzaprine (FLEXERIL) 5 MG tablet TAKE 1 TABLET(5 MG) BY MOUTH THREE TIMES DAILY AS NEEDED FOR MUSCLE SPASMS 30 tablet 0  . exemestane (AROMASIN) 25 MG tablet Take 1 tablet (25 mg total) by mouth daily after breakfast. 90 tablet 1  . HYDROcodone-acetaminophen (NORCO) 5-325 MG tablet Take 1-2 tablets by mouth every 4 (four) hours as needed for moderate pain or severe pain. 40 tablet 0  . Neratinib Maleate (NERLYNX) 40 MG tablet Take 6 tablets (240 mg total) by mouth daily. See the above instruction for initiation use dose ramp up. Take with food. 126 tablet 0  . omeprazole  (PRILOSEC) 20 MG capsule Take 1 capsule (20 mg total) by mouth 2 (two) times daily before a meal. 60 capsule 2  . potassium chloride SA (KLOR-CON) 20 MEQ tablet TAKE 1 TABLET BY MOUTH EVERY DAY 30 tablet 3  . traMADol (ULTRAM) 50 MG tablet Take 1 tablet twice daily as needed for severe pain 30 tablet 0  . venlafaxine (EFFEXOR) 37.5 MG tablet Take 1 tablet (37.5 mg total) by mouth 2 (two) times daily. 30 tablet 3   No current facility-administered medications for this visit.    PHYSICAL EXAMINATION: ECOG PERFORMANCE STATUS: 1 - Symptomatic but completely ambulatory  No vitals taken today, Exam not performed today   LABORATORY DATA:  I have reviewed the data as listed CBC Latest Ref Rng & Units 05/16/2019 03/18/2019 02/25/2019  WBC 4.0 - 10.5 K/uL 4.6 4.7 5.2  Hemoglobin 12.0 - 15.0 g/dL 12.1 11.5(L) 10.2(L)  Hematocrit 36.0 - 46.0 % 33.9(L) 32.5(L) 29.0(L)  Platelets 150 - 400 K/uL 144(L) 119(L) 134(L)     CMP Latest Ref Rng & Units 05/16/2019 03/18/2019 02/25/2019  Glucose 70 - 99 mg/dL 98 106(H) 99  BUN 6 - 20 mg/dL 13 11 9   Creatinine 0.44 - 1.00 mg/dL 0.67 0.69 0.65  Sodium 135 - 145 mmol/L 142 141 141  Potassium 3.5 - 5.1 mmol/L 3.7 3.9 3.9  Chloride 98 - 111 mmol/L 107 107 108  CO2 22 - 32 mmol/L 23 24 24   Calcium 8.9 - 10.3 mg/dL 9.1 9.0 8.8(L)  Total Protein 6.5 - 8.1 g/dL 7.5 7.4 7.0  Total Bilirubin 0.3 - 1.2 mg/dL 0.5 0.5 0.4  Alkaline Phos 38 - 126 U/L 155(H) 159(H) 160(H)  AST 15 - 41 U/L 24 26 33  ALT 0 - 44 U/L 20 19 28       RADIOGRAPHIC STUDIES: I have personally reviewed the radiological images as listed and agreed with the findings in the report. No results found.   ASSESSMENT & PLAN:  Claire Guzman is a 54 y.o. female with    1. Malignant neoplasm of upper-inner quadrant of right breast in female, invasive ductal carcinoma, cT2N1M0 stage IB, ER+/PR+/HER2+, G3, ypT0N0 -She was diagnosed in 02/2018. She is s/pneoadjuvantchemoof 6  cyclesTCHP,right breast mastectomyandaxillary lymph node dissectionon 2/18/20and adjuvant radiation.She completed 1 year maintenance Herceptin/Perjeta in 03/2019. -She started antiestrogen therapy in 12/2018. She did not tolerate tamoxifen, Anastrozole or Letrozole. She is currently on Exemestane since 04/2019. Tolerating much better with manageable hot flashes and mild joint stiffness. Will continue.  -She underwent reconstruction surgery and PAC removal by Dr. Iran Planas on 07/26/19.  -I discussed given her ER+/HER2 positive disease she is eligible for more anti-HER2 treatment with oral Nerlynx for one year. I discussed  the benefit (reduce risk of recurrence and improve overall survival) and major side effects, especially diarrhea which can improve or resolve after the first 1-2 months. I discussed she would take imodium and budesonide with this. She is agreeable to try, I will call in today. I recommend dose ramp up for first month (start at 126m daily for first week, increase to 160 daily for week 2 then 2468mdaily from week 3 if tolerates)  -F/u 2 weeks after she starts medication.    2. Right arm pain and lymphedema -New onset since 06/15/19, 3 days after long distance driving.  -Based on prior exam this is trace edema of right arm, this is likely related to her previous breast surgery.  -She completed PT. I recommend she continue those exercises.  -She notes her pain and lymphedema has improved including the tingling of her hand. I encouraged her to still use compression sleeve.    Plan: -Continue Exemestane, filled today  -I called in Nerlynx, imodium and budesonide -she will call me when she received medication, and I will schedule to see her with lab in 2 weeks.     No problem-specific Assessment & Plan notes found for this encounter.   No orders of the defined types were placed in this encounter.  I discussed the assessment and treatment plan with the patient. The  patient was provided an opportunity to ask questions and all were answered. The patient agreed with the plan and demonstrated an understanding of the instructions.  The patient was advised to call back or seek an in-person evaluation if the symptoms worsen or if the condition fails to improve as anticipated.  The total time spent in the appointment was 30 minutes.    YaTruitt MerleMD 08/11/2019   I, AmJoslyn Devonam acting as scribe for YaTruitt MerleMD.   I have reviewed the above documentation for accuracy and completeness, and I agree with the above.

## 2019-08-10 ENCOUNTER — Telehealth: Payer: Self-pay | Admitting: Hematology

## 2019-08-10 NOTE — Telephone Encounter (Signed)
Contacted patient to verify telephone visit for pre reg °

## 2019-08-11 ENCOUNTER — Encounter: Payer: Self-pay | Admitting: Hematology

## 2019-08-11 ENCOUNTER — Inpatient Hospital Stay: Payer: Medicaid Other | Attending: Hematology | Admitting: Hematology

## 2019-08-11 DIAGNOSIS — R599 Enlarged lymph nodes, unspecified: Secondary | ICD-10-CM | POA: Insufficient documentation

## 2019-08-11 DIAGNOSIS — Z808 Family history of malignant neoplasm of other organs or systems: Secondary | ICD-10-CM | POA: Insufficient documentation

## 2019-08-11 DIAGNOSIS — C50211 Malignant neoplasm of upper-inner quadrant of right female breast: Secondary | ICD-10-CM | POA: Diagnosis not present

## 2019-08-11 DIAGNOSIS — Z170 Estrogen receptor positive status [ER+]: Secondary | ICD-10-CM | POA: Diagnosis not present

## 2019-08-11 DIAGNOSIS — Z79811 Long term (current) use of aromatase inhibitors: Secondary | ICD-10-CM | POA: Insufficient documentation

## 2019-08-11 DIAGNOSIS — I10 Essential (primary) hypertension: Secondary | ICD-10-CM | POA: Insufficient documentation

## 2019-08-11 DIAGNOSIS — C773 Secondary and unspecified malignant neoplasm of axilla and upper limb lymph nodes: Secondary | ICD-10-CM | POA: Insufficient documentation

## 2019-08-11 DIAGNOSIS — Z800 Family history of malignant neoplasm of digestive organs: Secondary | ICD-10-CM | POA: Insufficient documentation

## 2019-08-11 MED ORDER — NERATINIB MALEATE 40 MG PO TABS: 240.0000 mg | ORAL_TABLET | Freq: Every day | ORAL | 0 refills | Status: DC

## 2019-08-11 MED ORDER — BUDESONIDE ER 9 MG PO CP24: 9.0000 mg | ORAL_CAPSULE | Freq: Every day | ORAL | 0 refills | Status: DC

## 2019-08-11 MED ORDER — EXEMESTANE 25 MG PO TABS: 25.0000 mg | ORAL_TABLET | Freq: Every day | ORAL | 1 refills | Status: DC

## 2019-08-12 ENCOUNTER — Telehealth: Payer: Self-pay

## 2019-08-12 ENCOUNTER — Telehealth: Payer: Self-pay | Admitting: Pharmacist

## 2019-08-12 ENCOUNTER — Telehealth: Payer: Self-pay | Admitting: Hematology

## 2019-08-12 DIAGNOSIS — Z170 Estrogen receptor positive status [ER+]: Secondary | ICD-10-CM

## 2019-08-12 DIAGNOSIS — C50211 Malignant neoplasm of upper-inner quadrant of right female breast: Secondary | ICD-10-CM

## 2019-08-12 NOTE — Telephone Encounter (Signed)
Oral Oncology Pharmacist Encounter  Received new prescription for Nerlynx (neratinib) for adjuvant treatment of HER2 positive breast cancer, planned duration of one year.  CMP from 05/15/20 assessed, no relevant lab abnormalities. Recommend checking a new CMP at her next office visit. Prescription dose and frequency assessed. Planned dose ramp up to help prevent diarrhea. 148m week1, 1627mweek 2, 24045meek 3 (goal dose). She will also be started on budesonide and imodium.   Current medication list in Epic reviewed, one DDIs with neratinib identified: - Omeprazole: Proton Pump Inhibitors (PPI) may diminish the concentration of neratinib. Recommend evaluating the need for a PPI/acid suppression. If acid suppression is needed, recommend switching to a H2 antagonist (eg, famotidine) or calcium carbonate to avoid this DDI. Neratinib should be taken at least 2 hours before or 10 hours after an H2 antagonists. Neratinib should be separated from calcium carbonate by at least 3 hours.   Pres[facility] and [facility].  Oral Oncology Clinic will continue to follow for insurance authorization, copayment issues, initial counseling and start date.  AlyDarl PikesharmD, BCPS, BCOWatauga Medical Center, Inc.matology/Oncology Clinical Pharmacist [facility]/AP [phone]/15/2021 4:36 PM

## 2019-08-12 NOTE — Telephone Encounter (Signed)
Oral Oncology Patient Advocate Encounter  Prior Authorization for Nerlynx has been approved.    PA# [id number] Effective dates: 08/12/19 through 08/11/20   But rx must be filled at [place]  [place] Patient [place] Phone [phone] Fax [phone] 08/12/2019 10:59 AM

## 2019-08-12 NOTE — Telephone Encounter (Signed)
Oral Oncology Patient Advocate Encounter  Received notification from [facility] that prior authorization for Nerlynx is required.  PA submitted on CoverMyMeds Key [id number] Status is pending  Oral Oncology Clinic will continue to follow.  [place] Patient [place] Phone [phone] Fax [phone] 08/12/2019 9:50 AM

## 2019-08-12 NOTE — Telephone Encounter (Signed)
Oral Chemotherapy Pharmacist Encounter  Due to insurance restriction the medication could not be filled at [place]. Prescription has been e-scribed to [employer].  Supportive information was faxed to [facility] We will continue to follow medication access.   Darl Pikes, PharmD, BCPS, [facility] Hematology/Oncology Clinical Pharmacist [facility]/AP Oral [place] [facility] [phone]  08/12/2019 4:37 PM

## 2019-08-12 NOTE — Telephone Encounter (Signed)
No los per 11/4 °

## 2019-08-16 MED ORDER — NERATINIB MALEATE 40 MG PO TABS: ORAL_TABLET | ORAL | 0 refills | Status: DC

## 2019-08-18 MED ORDER — NERATINIB MALEATE 40 MG PO TABS: ORAL_TABLET | ORAL | 0 refills | Status: DC

## 2019-08-18 NOTE — Telephone Encounter (Signed)
Oral Chemotherapy Pharmacist Encounter   [place] stated that had to send the prescription to [facility]. This is likely due to Nerlynx being a limited distribution medication.   Elizabeth called [facility] and they did not see the prescription. We will send the prescription directly to [facility] from the office.  Darl Pikes, PharmD, BCPS, [facility] Hematology/Oncology Clinical Pharmacist [facility]/AP Oral [place] [facility] [phone]  08/18/2019 1:58 PM

## 2019-08-19 NOTE — Telephone Encounter (Signed)
Oral Chemotherapy Pharmacist Encounter  Ms. Devine called me to let me know that her medication was delivered today. She plans to get started tomorrow 08/20/19.  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  Counseled patient on administration, dosing, side effects, monitoring, drug-food interactions, safe handling, storage, and disposal. Patient will take 3 tablets by mouth daily with food for 7 days, then 4 tablets daily for 7 days, then 6 tablets daily.  She has not picked up her budesonide but plans to pick that up today along with some loperamide.  Reviewed DDI with omeprazole (detialed in previous note). Patient reports taking omeprazole for "a while". Patient is amenable to trying to wean off their PPI. Patient will start by alternating his PPI with famotidine for a week, then further spacing out the PPI. Reviewed the spacing of neratinib and famotidine. Patient knows to call with any issues or questions related to the interaction.  Side effects include but not limited to: diarrhea, N/V, abdominal pain.    Reviewed with patient importance of keeping a medication schedule and plan for any missed doses.  Ms. Mcquown voiced understanding and appreciation. All questions answered. Medication handout and calendar placed in the mail.  Provided patient with [facility] phone number. Patient knows to call the office with questions or concerns. Oral [facility]otherapy [facility] will continue to follow.  Darl Pikes, PharmD, BCPS, [facility] Hematology/Oncology Clinical Pharmacist [facility] [phone]  08/19/2019 12:03 PM

## 2019-08-22 ENCOUNTER — Telehealth: Payer: Self-pay

## 2019-08-22 ENCOUNTER — Telehealth: Payer: Self-pay | Admitting: Hematology

## 2019-08-22 NOTE — Telephone Encounter (Signed)
Claire Guzman call with c/o swelling on the right clavicular area.  She states my "lymphnodes" are swollen. She feels distinct nodules. This area is tender to touch.  She denies UR symptoms and fever. Please advise

## 2019-08-22 NOTE — Telephone Encounter (Signed)
Lacie or me can see her sometime this week. If my schedule is full, OK to use my 1pm slot except this Wednesday. Thanks  Truitt Merle MD

## 2019-08-22 NOTE — Telephone Encounter (Signed)
I called Ms Smid and let her know Dr. Burr Medico or Regan Rakers will see her this week.  I told her scheduling department will be calling her.  She verbalized understanding.

## 2019-08-22 NOTE — Telephone Encounter (Signed)
I talk with patient regarding 1/26 °

## 2019-08-23 ENCOUNTER — Other Ambulatory Visit: Payer: Self-pay

## 2019-08-23 ENCOUNTER — Inpatient Hospital Stay (HOSPITAL_BASED_OUTPATIENT_CLINIC_OR_DEPARTMENT_OTHER): Payer: Medicaid Other | Admitting: Hematology

## 2019-08-23 ENCOUNTER — Encounter: Payer: Self-pay | Admitting: Hematology

## 2019-08-23 ENCOUNTER — Inpatient Hospital Stay: Payer: Medicaid Other

## 2019-08-23 VITALS — BP 137/89 | HR 64 | Temp 97.7°F | Resp 20

## 2019-08-23 DIAGNOSIS — C50211 Malignant neoplasm of upper-inner quadrant of right female breast: Secondary | ICD-10-CM | POA: Diagnosis not present

## 2019-08-23 DIAGNOSIS — C773 Secondary and unspecified malignant neoplasm of axilla and upper limb lymph nodes: Secondary | ICD-10-CM | POA: Diagnosis not present

## 2019-08-23 DIAGNOSIS — Z808 Family history of malignant neoplasm of other organs or systems: Secondary | ICD-10-CM | POA: Diagnosis not present

## 2019-08-23 DIAGNOSIS — Z170 Estrogen receptor positive status [ER+]: Secondary | ICD-10-CM

## 2019-08-23 DIAGNOSIS — Z79811 Long term (current) use of aromatase inhibitors: Secondary | ICD-10-CM | POA: Diagnosis not present

## 2019-08-23 DIAGNOSIS — Z800 Family history of malignant neoplasm of digestive organs: Secondary | ICD-10-CM | POA: Diagnosis not present

## 2019-08-23 DIAGNOSIS — I10 Essential (primary) hypertension: Secondary | ICD-10-CM | POA: Diagnosis not present

## 2019-08-23 DIAGNOSIS — R599 Enlarged lymph nodes, unspecified: Secondary | ICD-10-CM | POA: Diagnosis not present

## 2019-08-23 LAB — CBC WITH DIFFERENTIAL (CANCER CENTER ONLY)
Abs Immature Granulocytes: 0.02 10*3/uL (ref 0.00–0.07)
Basophils Absolute: 0 10*3/uL (ref 0.0–0.1)
Basophils Relative: 0 %
Eosinophils Absolute: 0.3 10*3/uL (ref 0.0–0.5)
Eosinophils Relative: 5 %
HCT: 35 % — ABNORMAL LOW (ref 36.0–46.0)
Hemoglobin: 12.3 g/dL (ref 12.0–15.0)
Immature Granulocytes: 0 %
Lymphocytes Relative: 42 %
Lymphs Abs: 2.1 10*3/uL (ref 0.7–4.0)
MCH: 32.6 pg (ref 26.0–34.0)
MCHC: 35.1 g/dL (ref 30.0–36.0)
MCV: 92.8 fL (ref 80.0–100.0)
Monocytes Absolute: 0.4 10*3/uL (ref 0.1–1.0)
Monocytes Relative: 8 %
Neutro Abs: 2.2 10*3/uL (ref 1.7–7.7)
Neutrophils Relative %: 45 %
Platelet Count: 162 10*3/uL (ref 150–400)
RBC: 3.77 MIL/uL — ABNORMAL LOW (ref 3.87–5.11)
RDW: 12 % (ref 11.5–15.5)
WBC Count: 4.9 10*3/uL (ref 4.0–10.5)
nRBC: 0 % (ref 0.0–0.2)

## 2019-08-23 LAB — CMP (CANCER CENTER ONLY)
ALT: 12 U/L (ref 0–44)
AST: 17 U/L (ref 15–41)
Albumin: 4.4 g/dL (ref 3.5–5.0)
Alkaline Phosphatase: 139 U/L — ABNORMAL HIGH (ref 38–126)
Anion gap: 11 (ref 5–15)
BUN: 14 mg/dL (ref 6–20)
CO2: 26 mmol/L (ref 22–32)
Calcium: 9.3 mg/dL (ref 8.9–10.3)
Chloride: 107 mmol/L (ref 98–111)
Creatinine: 0.7 mg/dL (ref 0.44–1.00)
GFR, Est AFR Am: >60 mL/min (ref >60)
GFR, Estimated: >60 mL/min (ref >60)
Glucose, Bld: 98 mg/dL (ref 70–99)
Potassium: 3.5 mmol/L (ref 3.5–5.1)
Sodium: 144 mmol/L (ref 135–145)
Total Bilirubin: 0.4 mg/dL (ref 0.3–1.2)
Total Protein: 7.6 g/dL (ref 6.5–8.1)
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
## 2019-08-24 ENCOUNTER — Telehealth: Payer: Self-pay | Admitting: Hematology

## 2019-08-24 LAB — CANCER ANTIGEN 27.29: CA 27.29: 36.2 U/mL (ref 0.0–38.6)

## 2019-08-24 NOTE — Telephone Encounter (Signed)
Scheduled appt per 1/26 los.  Spoke with pt and she is aware of the appt date and time.

## 2019-08-26 ENCOUNTER — Ambulatory Visit
Admission: RE | Admit: 2019-08-26 | Discharge: 2019-08-26 | Disposition: A | Discharge status: Home / Self Care | Payer: Medicaid Other | Source: Ambulatory Visit | Attending: Hematology | Admitting: Hematology

## 2019-08-26 ENCOUNTER — Ambulatory Visit (HOSPITAL_COMMUNITY)
Admission: RE | Admit: 2019-08-26 | Discharge: 2019-08-26 | Disposition: A | Discharge status: Home / Self Care | Payer: Medicaid Other | Source: Ambulatory Visit | Attending: Hematology | Admitting: Hematology

## 2019-08-26 ENCOUNTER — Other Ambulatory Visit: Payer: Self-pay

## 2019-08-26 DIAGNOSIS — Z170 Estrogen receptor positive status [ER+]: Secondary | ICD-10-CM | POA: Insufficient documentation

## 2019-08-26 DIAGNOSIS — E2839 Other primary ovarian failure: Secondary | ICD-10-CM

## 2019-08-26 DIAGNOSIS — C50211 Malignant neoplasm of upper-inner quadrant of right female breast: Secondary | ICD-10-CM

## 2019-08-29 ENCOUNTER — Telehealth: Payer: Self-pay

## 2019-08-29 NOTE — Telephone Encounter (Signed)
I spoke with Claire Guzman.  I told her the u/s of her neck was negative.  She confirmed she has started the Nerlynix and budesonide today.

## 2019-08-31 NOTE — Progress Notes (Signed)
[place]   [phone] Fax:([phone]) [phone]   Clinic Follow up Note   Patient Care Team: Lujean Amel, MD as PCP - General (Family Medicine) Fanny Skates, MD as Consulting Physician (General Surgery) Truitt Merle, MD as Consulting Physician (Hematology) Eppie Gibson, MD as Attending Physician (Radiation Oncology) Mauro Kaufmann, RN as Oncology Nurse Navigator Rockwell Germany, RN as Oncology Nurse Navigator   Date of Service:  09/07/2019   CHIEF COMPLAINT: F/u for right breast cancer  SUMMARY OF ONCOLOGIC HISTORY: Oncology History Overview Note  Cancer Staging Malignant neoplasm of upper-inner quadrant of right breast in female, estrogen receptor positive ([place]) Staging form: Breast, AJCC 8th Edition - Clinical stage from 03/08/2018: Stage IB (cT2, cN1, cM0, G3, ER+, PR+, HER2+) - Signed by Truitt Merle, MD on 03/16/2018     Malignant neoplasm of upper-inner quadrant of right breast in female, estrogen receptor positive ([place])  02/24/2018 Mammogram   screening mammogram on 02/24/2018 that was benign   03/04/2018 Mammogram   diagnostic mammogram on 03/04/2018 due to a palpable mass on her right breast. Diagnostic mammogram, breast US revealed and biopsy revealed suspicious mass in the right breast at 2:30 at the palpable site of concern and one suspicious right axillary LN.   03/08/2018 Cancer Staging   Staging form: Breast, AJCC 8th Edition - Clinical stage from 03/08/2018: Stage IB (cT2, cN1, cM0, G3, ER+, PR+, HER2+) - Signed by Truitt Merle, MD on 03/16/2018   03/08/2018 Receptors her2   Her2 positive, ER 90% PR 5% and Ki67 60%   03/08/2018 Pathology Results   Diagnosis 1. Breast, right, needle core biopsy, 2:30 - INVASIVE DUCTAL CARCINOMA, GRADE 3. - LYMPHOVASCULAR SPACE INVOLVEMENT BY TUMOR. 2. Lymph node, needle/core biopsy, right axilla - METASTATIC CARCINOMA INVOLVING SCANT LYMPHOID TISSUE.   03/10/2018 Initial Diagnosis   Malignant neoplasm of  upper-inner quadrant of right breast in female, estrogen receptor positive ([place])   03/20/2018 Imaging   MRI brain 03/20/18 IMPRESSION: No evidence of metastatic disease.  Normal appearance of brain. Slightly heterogeneous marrow pattern of the upper cervical spine, nonspecific but likely benign, especially in the setting of early stage disease.    03/24/2018 Echocardiogram   Baseline ECHO 03/24/18 Study Conclusions - Left ventricle: The cavity size was normal. Wall thickness was   increased in a pattern of mild LVH. Systolic function was normal.   The estimated ejection fraction was in the range of 60% to 65%.   Wall motion was normal; there were no regional wall motion   abnormalities. Doppler parameters are consistent with abnormal   left ventricular relaxation (grade 1 diastolic dysfunction). - Right ventricle: The cavity size was mildly dilated. Wall   thickness was normal.   03/25/2018 Imaging   MRI Breast B/l 03/25/18 IMPRESSION: 1. The patient's primary malignancy in the posterior right breast measures 3.2 x 2 x 2.6 cm with apparent invasion of the underlying pectoralis muscle. Numerous surrounding abnormal enhancing masses, consistent with satellite lesions, extend from 11 o'clock in the right breast into the inferolateral right breast with a total span of suspected disease measuring 4.5 x 4.6 x 6.4 cm in AP, transverse, and craniocaudal dimension. The suspected extent of disease involves the superior and inferolateral quadrants. The primary malignancy also extends just across midline into the superior medial quadrant. 2. No MRI evidence of malignancy in the left breast. 3. Known metastatic noted in the right axilla.   03/25/2018 Imaging   Whole body bone scan 03/25/18 IMPRESSION: No scintigraphic  evidence skeletal metastasis.   03/25/2018 Imaging   CT CAP W Contrast 03/25/18 IMPRESSION: 1. Mass within the deep aspect of the right breast along the pectoralis muscle  compatible with primary breast malignancy. 2. There is suggestion of two additional possible masses within the lateral aspect of the right breast versus nodular appearing breast tissue. Recommend dedicated evaluation with bilateral breast MRI. 3. Mildly thickened right axillary lymph node compatible with metastatic adenopathy, recently biopsied. 4. Multiple bilateral pulmonary nodules are indeterminate, potentially sequelae of prior infectious/inflammatory process. Recommend attention on follow-up. Consider follow-up CT in 6 months. 5. Portal venous gas is demonstrated within the left hepatic lobe. Additionally, there is wall thickening of the cecum and ascending colon with small amount of gas within the pericolonic vasculature. Constellation of findings is indeterminate in etiology however may be secondary to colitis at this location with considerations including infectious, inflammatory or ischemic etiologies. Correlate for recent procedure. If patient is not up-to-date for colonic screening, recommend further evaluation with colonoscopy to exclude the possibility of colonic mass within the cecum/ascending colon. 6. These results were called by telephone at the time of interpretation on 03/25/2018 at 9:31 am to Dr. Truitt Merle , who verbally acknowledged these results.   03/30/2018 - 04/08/2019 Chemotherapy   Neoadjuvant TCHP every 3 weeks starting 03/30/18. Completed 6 cycles of TCHP on 07/23/18.  Continued with maintenance Herceptin/Perjeta q3weeks to complete 1 year.    04/09/2018 Genetic Testing   Negative genetic testing on the [facility] Common Hereditary Cancers Panel. The Common Hereditary Cancers Panel offered by [facility] includes sequencing and/or deletion duplication testing of the following 47 genes: APC, ATM, AXIN2, BARD1, BMPR1A, BRCA1, BRCA2, BRIP1, CDH1, CDKN2A (p14ARF), CDKN2A (p16INK4a), CKD4, CHEK2, CTNNA1, DICER1, EPCAM (Deletion/duplication testing only), GREM1 (promoter region  deletion/duplication testing only), KIT, MEN1, MLH1, MSH2, MSH3, MSH6, MUTYH, NBN, NF1, NHTL1, PALB2, PDGFRA, PMS2, POLD1, POLE, PTEN, RAD50, RAD51C, RAD51D, SDHB, SDHC, SDHD, SMAD4, SMARCA4. STK11, TP53, TSC1, TSC2, and VHL.  The following genes were evaluated for sequence changes only: SDHA and HOXB13 c.251G>A variant only.   Genetic testing did detect a Variant of Unknown Significance (VUS) in the POLD1 gene called c.985C>T (p.Pro329Ser). At this time, it is unknown if this variant is associated with increased cancer risk or if this is a normal finding, but most variants such as this get reclassified to being inconsequential. It should not be used to make medical management decisions.   The report date is 04/09/2018.   07/24/2018 Breast MRI   IMPRESSION: 1. Resolution of the enhancing mass (known cancer) and small satellite lesions previously seen in the right breast following chemotherapy.  2. The metastatic lymph node in the right axilla has decreased in size.  3.  No MRI evidence of left breast malignancy.   09/14/2018 Surgery   RIGHT NIPPLE SPARING MASTECTOMY WITH TARGETED DEEP RIGHT AXILLARY LYMPH NODE BIOPSY WITH RADIOACTIVE SEED LOCALIZATION AND RIGHT AXILLARY SENTINEL LYMPH NODE BIOPSY by Dr. Dalbert Batman  09/14/18    09/14/2018 Pathology Results   Diagnosis 1. Lymph node, sentinel, biopsy, right axillary with radioactive seed - ONE OF ONE LYMPH NODES NEGATIVE FOR CARCINOMA (0/1). - MILD TREATMENT EFFECT. - BIOPSY SITE. 2. Lymph node, sentinel, biopsy, right axillary - ONE OF ONE LYMPH NODES NEGATIVE FOR CARCINOMA (0/1). 3. Lymph node, sentinel, biopsy, right - ONE OF ONE LYMPH NODES NEGATIVE FOR CARCINOMA (0/1). 4. Lymph node, sentinel, biopsy, right - ONE OF ONE LYMPH NODES NEGATIVE FOR CARCINOMA (0/1). 5. Lymph node, sentinel, biopsy, right -  ONE OF ONE LYMPH NODES NEGATIVE FOR CARCINOMA (0/1). 6. Lymph node, sentinel, biopsy, right - ONE OF ONE LYMPH NODES NEGATIVE FOR  CARCINOMA (0/1). 7. Lymph node, sentinel, biopsy, right - ONE OF ONE LYMPH NODES NEGATIVE FOR CARCINOMA (0/1). 8. Lymph node, sentinel, biopsy, right - ONE OF ONE LYMPH NODES NEGATIVE FOR CARCINOMA (0/1). 9. Lymph node, sentinel, biopsy, right - ONE OF ONE LYMPH NODES NEGATIVE FOR CARCINOMA (0/1). 10. Lymph node, sentinel, biopsy, right - ONE OF ONE LYMPH NODES NEGATIVE FOR CARCINOMA (0/1). 11. Lymph node, sentinel, biopsy, right - ONE OF ONE LYMPH NODES NEGATIVE FOR CARCINOMA (0/1). 12. Lymph node, sentinel, biopsy, right - ONE OF ONE LYMPH NODES NEGATIVE FOR CARCINOMA (0/1). 13. Nipple Biopsy, right - BENIGN NIPPLE TISSUE. 14. Breast, simple mastectomy, right - NO RESIDUAL CARCINOMA IDENTIFIED. - TREATMENT EFFECT. - FIBROCYSTIC CHANGE. - ONE OF ONE LYMPH NODES NEGATIVE FOR CARCINOMA (0/1). - SEE ONCOLOGY TABLE.   09/14/2018 Cancer Staging   Staging form: Breast, AJCC 8th Edition - Pathologic stage from 09/14/2018: No Stage Recommended (ypT0, pN0, cM0, GX, ER: Not Assessed, PR: Not Assessed, HER2: Not Assessed) - Signed by Truitt Merle, MD on 09/23/2018   11/03/2018 - 12/10/2018 Radiation Therapy   Adjuvant Radiation with Dr. Isidore Moos    12/27/2018 -  Anti-estrogen oral therapy   Start Tamoxifen 92m daily in 12/27/2018 stopped after 1 week due to poor toleration. She started anastrozole on 02/04/19 but stopped on 02/14/19 due to worsening muscle aches. She tried Letrozole but did not tolerate. She was switched to Exemestane in late 04/2019.    01/04/2019 Imaging   CT chest  IMPRESSION: 1. Stable tiny subpleural and perifissural nodules bilaterally. No suspicious pulmonary nodule or mass. 2. No focal airspace disease in the right lung. 3.  Aortic Atherosclerois (ICD10-170.0)     03/08/2019 Imaging   CT AP W Contrast 03/08/19  IMPRESSION: 1. No metastatic disease in the abdomen pelvis. 2. No evidence adverse immunotherapy therapy reaction.   03/08/2019 Imaging   Whole body bone scan 03/08/19   IMPRESSION: No definite scintigraphic evidence of osseous metastases.   08/29/2019 -  Chemotherapy   Started Nerlynx with budesonide on 08/29/19      CURRENT THERAPY:  -Tamoxifen 266mailystarting6/1/2020stopped after 1 week due to poor toleration.She started anastrozole on 02/04/19 but stopped on 02/14/19 due to worsening muscle aches.She tried Letrozole but did not tolerate.She was switched to Exemestane in late10/2020. -Started Nerlynx with budesonide on 08/29/19  INTERVAL HISTORY:  JeEtsuko Dierolfs here for a follow up. She presents to the clinic alone. She notes she has tolerated Nerlynx. She notes she has mild indigestion. She notes her main issue is trouble sleeping. She has been taking her steroid at night because she cannot take it same time as Nerlynx in the morning. She notes she did not have this issue before Nerlynx. She notes she also has RLS. She notes muscle relaxer helps that. She denies diarrhea at this time. She has not taken imodium. She notes she skipped 1 day of budeoimide and had loose stool 3 times in 1 day. She denies dizziness or dehydration.     REVIEW OF SYSTEMS:   Constitutional: Denies fevers, chills or abnormal weight loss (+) Insomnia  Eyes: Denies blurriness of vision Ears, nose, mouth, throat, and face: Denies mucositis or sore throat Respiratory: Denies cough, dyspnea or wheezes Cardiovascular: Denies palpitation, chest discomfort or lower extremity swelling Gastrointestinal:  Denies nausea, heartburn or change in bowel habits Skin: Denies abnormal  skin rashes Lymphatics: Denies new lymphadenopathy or easy bruising Neurological:Denies numbness, tingling or new weaknesses Behavioral/Psych: Mood is stable, no new changes  All other systems were reviewed with the patient and are negative.  MEDICAL HISTORY:  Past Medical History:  Diagnosis Date  . Abdominal pain, unspecified site   . Anxiety state, unspecified   . Complication of  anesthesia    convulsions - when she has appendectomy about 12 years ago at [facility]  . Contact dermatitis and other eczema, due to unspecified cause   . Essential hypertension, benign   . Family history of colon cancer   . Family history of melanoma   . Family history of prostate cancer   . GERD (gastroesophageal reflux disease)   . Hematuria, unspecified   . Hypertension   . Lower back pain   . Mixed hyperlipidemia   . Rosacea   . rt breast ca dx'd 01/2018   R breast cancer  . Unspecified symptom associated with female genital organs     SURGICAL HISTORY: Past Surgical History:  Procedure Laterality Date  . BREAST RECONSTRUCTION WITH PLACEMENT OF TISSUE EXPANDER AND ALLODERM Right 09/14/2018   Procedure: BREAST RECONSTRUCTION WITH PLACEMENT OF TISSUE EXPANDER AND ALLODERM;  Surgeon: Irene Limbo, MD;  Location: [facility];  Service: Plastics;  Laterality: Right;  . CESAREAN SECTION    . DIAGNOSTIC LAPAROSCOPY    . EYE SURGERY     lasix  . LAPAROSCOPIC APPENDECTOMY    . LIPOSUCTION WITH LIPOFILLING Right 07/26/2019   Procedure: LIPOFILLING from abdomen to R chest;  Surgeon: Irene Limbo, MD;  Location: [facility];  Service: Plastics;  Laterality: Right;  . MASTECTOMY WITH RADIOACTIVE SEED GUIDED EXCISION AND AXILLARY SENTINEL LYMPH NODE BIOPSY Right 09/14/2018   Procedure: RIGHT NIPPLE SPARING MASTECTOMY WITH TARGETED DEEP RIGHT AXILLARY LYMPH NODE BIOPSY WITH RADIOACTIVE SEED LOCALIZATION AND RIGHT AXILLARY SENTINEL LYMPH NODE BIOPSY, FROZEN SECTION AND POSSIBLE COMPLETION AXILLARY LYMPH NODE DISSECTION INJECT BLUE DYE RIGHT BREAST;  Surgeon: Fanny Skates, MD;  Location: [facility];  Service: General;  Laterality: Right;  . PORT-A-CATH REMOVAL Left 07/26/2019   Procedure: REMOVAL PORT-A-CATH;  Surgeon: Irene Limbo, MD;  Location: [facility];  Service: Plastics;  Laterality: Left;  . PORTACATH PLACEMENT Left 04/13/2018   Procedure: INSERTION  PORT-A-CATH WITH [place];  Surgeon: Fanny Skates, MD;  Location: [facility];  Service: General;  Laterality: Left;  . REMOVAL OF TISSUE EXPANDER AND PLACEMENT OF IMPLANT Right 07/26/2019   Procedure: REMOVAL OF TISSUE EXPANDER AND PLACEMENT OF IMPLANT;  Surgeon: Irene Limbo, MD;  Location: [facility];  Service: Plastics;  Laterality: Right;  . UTERINE FIBROID SURGERY      I have reviewed the social history and family history with the patient and they are unchanged from previous note.  ALLERGIES:  is allergic to metrogel [metronidazole]; other; paxil [paroxetine hcl]; prednisone; and zomig [zolmitriptan].  MEDICATIONS:  Current Outpatient Medications  Medication Sig Dispense Refill  . ALPRAZolam (XANAX) 0.5 MG tablet TAKE 1 TABLET(0.5 MG) BY MOUTH AT BEDTIME AS NEEDED FOR ANXIETY 30 tablet 0  . atenolol (TENORMIN) 50 MG tablet Take 50 mg by mouth at bedtime.    . Budesonide ER 9 MG CP24 Take 9 mg by mouth daily. 30 capsule 0  . cyclobenzaprine (FLEXERIL) 5 MG tablet TAKE 1 TABLET(5 MG) BY MOUTH THREE TIMES DAILY AS NEEDED FOR MUSCLE SPASMS 30 tablet 0  . exemestane (AROMASIN) 25 MG tablet Take 1 tablet (25  mg total) by mouth daily after breakfast. 90 tablet 1  . HYDROcodone-acetaminophen (NORCO) 5-325 MG tablet Take 1-2 tablets by mouth every 4 (four) hours as needed for moderate pain or severe pain. 40 tablet 0  . Neratinib Maleate (NERLYNX) 40 MG tablet Take 3 tablets by mouth daily with food for 7 days, then 4 tablets daily for 7 days, then 6 tablets daily. 145 tablet 0  . omeprazole (PRILOSEC) 20 MG capsule Take 1 capsule (20 mg total) by mouth 2 (two) times daily before a meal. 60 capsule 2  . omeprazole (PRILOSEC) 40 MG capsule Take 40 mg by mouth daily.    Marland Kitchen oxyCODONE (OXY IR/ROXICODONE) 5 MG immediate release tablet Take 5 mg by mouth 4 (four) times daily as needed.    . potassium chloride SA (KLOR-CON) 20 MEQ tablet TAKE 1 TABLET BY MOUTH EVERY DAY 30  tablet 3  . traMADol (ULTRAM) 50 MG tablet Take 1 tablet twice daily as needed for severe pain 30 tablet 0  . venlafaxine (EFFEXOR) 37.5 MG tablet Take 1 tablet (37.5 mg total) by mouth 2 (two) times daily. 30 tablet 3   No current facility-administered medications for this visit.    PHYSICAL EXAMINATION: ECOG PERFORMANCE STATUS: 1 - Symptomatic but completely ambulatory  Vitals:   09/07/19 1355  BP: 126/80  Pulse: (!) 59  Resp: 18  Temp: 98.6 F (37 C)  SpO2: 100%   Filed Weights   09/07/19 1355  Weight: 162 lb 14.4 oz (73.9 kg)    GENERAL:alert, no distress and comfortable SKIN: skin color, texture, turgor are normal, no rashes or significant lesions EYES: normal, Conjunctiva are pink and non-injected, sclera clear  NECK: supple, thyroid normal size, non-tender, without nodularity LYMPH:  no palpable lymphadenopathy in the cervical, axillary  LUNGS: clear to auscultation and percussion with normal breathing effort HEART: regular rate & rhythm and no murmurs and no lower extremity edema ABDOMEN:abdomen soft, non-tender and normal bowel sounds Musculoskeletal:no cyanosis of digits and no clubbing  NEURO: alert & oriented x 3 with fluent speech, no focal motor/sensory deficits  LABORATORY DATA:  I have reviewed the data as listed CBC Latest Ref Rng & Units 08/23/2019 05/16/2019 03/18/2019  WBC 4.0 - 10.5 K/uL 4.9 4.6 4.7  Hemoglobin 12.0 - 15.0 g/dL 12.3 12.1 11.5(L)  Hematocrit 36.0 - 46.0 % 35.0(L) 33.9(L) 32.5(L)  Platelets 150 - 400 K/uL 162 144(L) 119(L)     CMP Latest Ref Rng & Units 08/23/2019 05/16/2019 03/18/2019  Glucose 70 - 99 mg/dL 98 98 106(H)  BUN 6 - 20 mg/dL 14 13 11   Creatinine 0.44 - 1.00 mg/dL 0.70 0.67 0.69  Sodium 135 - 145 mmol/L 144 142 141  Potassium 3.5 - 5.1 mmol/L 3.5 3.7 3.9  Chloride 98 - 111 mmol/L 107 107 107  CO2 22 - 32 mmol/L 26 23 24   Calcium 8.9 - 10.3 mg/dL 9.3 9.1 9.0  Total Protein 6.5 - 8.1 g/dL 7.6 7.5 7.4  Total Bilirubin  0.3 - 1.2 mg/dL 0.4 0.5 0.5  Alkaline Phos 38 - 126 U/L 139(H) 155(H) 159(H)  AST 15 - 41 U/L 17 24 26   ALT 0 - 44 U/L 12 20 19       RADIOGRAPHIC STUDIES: I have personally reviewed the radiological images as listed and agreed with the findings in the report. No results found.   ASSESSMENT & PLAN:  Claire Guzman is a 54 y.o. female with   1. Malignant neoplasm of upper-inner quadrant of right  breast in female, invasive ductal carcinoma, cT2N1M0 stage IB, ER+/PR+/HER2+, G3, ypT0N0 -She was diagnosed in 02/2018. She is s/pneoadjuvantchemoof 6 cyclesTCHP,right breast mastectomyandaxillary lymph node dissectionon 2/18/20and adjuvant radiation.She completed 1 year maintenance Herceptin/Perjeta in 03/2019. -She started antiestrogen therapy in 12/2018. She did not tolerate tamoxifen, Anastrozole or Letrozole. She is currently on Exemestane since 04/2019. Tolerating much better with manageable hot flashes and mild joint stiffness. Will continue.  -She underwent reconstruction surgery and PAC removal by Dr. Iran Planas on 07/26/19.  -I discussed given her ER+/HER2 positive disease she is eligible for more anti-HER2 treatment with oral Nerlynx for one year. She started of Nerlynx and budesonide on 08/29/19. She has been tolerating dose ramping (174m daily for first week, increase to 160 daily for week 2 then 2483mdaily from week 3 ) -She notes on her first week of Nerlynx and Budesonide she tolerated well but her budesonide caused insomnia. She had skipped 1 day of this and had loose BMs three times. I encouraged her to drink plenty of water. She may try holding budesonide and use Imodium if needed. -Phone visit in 2 weeks. OV in 4 weeks    Plan: -Continue Exemestane, Nerlynx dose raming and budesonide -Phone visit in 2 weeks -Lab and f/u in 4 weeks    No problem-specific Assessment & Plan notes found for this encounter.   No orders of the defined types were placed in this  encounter.  All questions were answered. The patient knows to call the clinic with any problems, questions or concerns. No barriers to learning was detected. The total time spent in the appointment was 25 minutes.    YaTruitt MerleMD 09/07/2019   I, AmJoslyn Devonam acting as scribe for YaTruitt MerleMD.   I have reviewed the above documentation for accuracy and completeness, and I agree with the above.

## 2019-08-31 NOTE — Telephone Encounter (Signed)
[place] stated that had to send the prescription to [facility]. This is likely due to Nerlynx being a limited distribution medication.   Elizabeth called [facility] and they did not see the prescription. We will send the prescription directly to [facility] from the office.

## 2019-09-07 ENCOUNTER — Other Ambulatory Visit: Payer: Self-pay

## 2019-09-07 ENCOUNTER — Encounter: Payer: Self-pay | Admitting: Hematology

## 2019-09-07 ENCOUNTER — Inpatient Hospital Stay: Payer: Medicaid Other | Attending: Hematology | Admitting: Hematology

## 2019-09-07 VITALS — BP 126/80 | HR 59 | Temp 98.6°F | Resp 18 | Ht 64.0 in | Wt 162.9 lb

## 2019-09-07 DIAGNOSIS — Z808 Family history of malignant neoplasm of other organs or systems: Secondary | ICD-10-CM | POA: Diagnosis not present

## 2019-09-07 DIAGNOSIS — Z170 Estrogen receptor positive status [ER+]: Secondary | ICD-10-CM

## 2019-09-07 DIAGNOSIS — Z79811 Long term (current) use of aromatase inhibitors: Secondary | ICD-10-CM | POA: Diagnosis not present

## 2019-09-07 DIAGNOSIS — N951 Menopausal and female climacteric states: Secondary | ICD-10-CM | POA: Insufficient documentation

## 2019-09-07 DIAGNOSIS — G4700 Insomnia, unspecified: Secondary | ICD-10-CM | POA: Diagnosis not present

## 2019-09-07 DIAGNOSIS — G2581 Restless legs syndrome: Secondary | ICD-10-CM | POA: Insufficient documentation

## 2019-09-07 DIAGNOSIS — Z79899 Other long term (current) drug therapy: Secondary | ICD-10-CM | POA: Insufficient documentation

## 2019-09-07 DIAGNOSIS — Z800 Family history of malignant neoplasm of digestive organs: Secondary | ICD-10-CM | POA: Diagnosis not present

## 2019-09-07 DIAGNOSIS — C50211 Malignant neoplasm of upper-inner quadrant of right female breast: Secondary | ICD-10-CM | POA: Insufficient documentation

## 2019-09-07 DIAGNOSIS — K30 Functional dyspepsia: Secondary | ICD-10-CM | POA: Insufficient documentation

## 2019-09-07 DIAGNOSIS — M2560 Stiffness of unspecified joint, not elsewhere classified: Secondary | ICD-10-CM | POA: Diagnosis not present

## 2019-09-07 DIAGNOSIS — Z9011 Acquired absence of right breast and nipple: Secondary | ICD-10-CM | POA: Insufficient documentation

## 2019-09-08 ENCOUNTER — Telehealth: Payer: Self-pay | Admitting: Hematology

## 2019-09-08 ENCOUNTER — Other Ambulatory Visit: Payer: Self-pay | Admitting: Hematology

## 2019-09-08 DIAGNOSIS — C50211 Malignant neoplasm of upper-inner quadrant of right female breast: Secondary | ICD-10-CM

## 2019-09-08 DIAGNOSIS — Z170 Estrogen receptor positive status [ER+]: Secondary | ICD-10-CM

## 2019-09-08 NOTE — Telephone Encounter (Signed)
Scheduled appt per 2/10 los.  Spoke with pt and she is aware of the appt date and time.

## 2019-09-13 ENCOUNTER — Other Ambulatory Visit: Payer: Self-pay | Admitting: Pharmacist

## 2019-09-13 DIAGNOSIS — Z170 Estrogen receptor positive status [ER+]: Secondary | ICD-10-CM

## 2019-09-13 DIAGNOSIS — C50211 Malignant neoplasm of upper-inner quadrant of right female breast: Secondary | ICD-10-CM

## 2019-09-13 MED ORDER — NERATINIB MALEATE 40 MG PO TABS: 240.0000 mg | ORAL_TABLET | Freq: Every day | ORAL | 0 refills | Status: DC

## 2019-09-14 ENCOUNTER — Other Ambulatory Visit: Payer: Medicaid Other

## 2019-09-14 ENCOUNTER — Ambulatory Visit: Payer: Medicaid Other | Admitting: Nurse Practitioner

## 2019-09-16 NOTE — Progress Notes (Signed)
[facility]   [phone] [phone]) [phone]   Clinic Follow up Note   Patient Care Team: Lujean Amel, MD as PCP - General (Family Medicine) Fanny Skates, MD as Consulting Physician (General Surgery) Truitt Merle, MD as Consulting Physician (Hematology) Eppie Gibson, MD as Attending Physician (Radiation Oncology) Mauro Kaufmann, RN as Oncology Nurse Navigator Rockwell Germany, RN as Oncology Nurse Navigator   I connected with Boykin Peek on 09/21/2019 at  8:20 AM EST by telephone visit and verified that I am speaking with the correct person using two identifiers.  I discussed the limitations, risks, security and privacy concerns of performing an evaluation and management service by telephone and the availability of in person appointments. I also discussed with the patient that there may be a patient responsible charge related to this service. The patient expressed understanding and agreed to proceed.   Patient's location:  Her home Provider's location:   My Office   CHIEF COMPLAINT: F/u for right breast cancer  SUMMARY OF ONCOLOGIC HISTORY: Oncology History Overview Note  Cancer Staging Malignant neoplasm of upper-inner quadrant of right breast in female, estrogen receptor positive ([place]) Staging form: Breast, AJCC 8th Edition - Clinical stage from 03/08/2018: Stage IB (cT2, cN1, cM0, G3, ER+, PR+, HER2+) - Signed by Truitt Merle, MD on 03/16/2018     Malignant neoplasm of upper-inner quadrant of right breast in female, estrogen receptor positive ([place])  02/24/2018 Mammogram   screening mammogram on 02/24/2018 that was benign   03/04/2018 Mammogram   diagnostic mammogram on 03/04/2018 due to a palpable mass on her right breast. Diagnostic mammogram, breast US revealed and biopsy revealed suspicious mass in the right breast at 2:30 at the palpable site of concern and one suspicious right axillary LN.   03/08/2018 Cancer Staging   Staging form: Breast,  AJCC 8th Edition - Clinical stage from 03/08/2018: Stage IB (cT2, cN1, cM0, G3, ER+, PR+, HER2+) - Signed by Truitt Merle, MD on 03/16/2018   03/08/2018 Receptors her2   Her2 positive, ER 90% PR 5% and Ki67 60%   03/08/2018 Pathology Results   Diagnosis 1. Breast, right, needle core biopsy, 2:30 - INVASIVE DUCTAL CARCINOMA, GRADE 3. - LYMPHOVASCULAR SPACE INVOLVEMENT BY TUMOR. 2. Lymph node, needle/core biopsy, right axilla - METASTATIC CARCINOMA INVOLVING SCANT LYMPHOID TISSUE.   03/10/2018 Initial Diagnosis   Malignant neoplasm of upper-inner quadrant of right breast in female, estrogen receptor positive ([place])   03/20/2018 Imaging   MRI brain 03/20/18 IMPRESSION: No evidence of metastatic disease.  Normal appearance of brain. Slightly heterogeneous marrow pattern of the upper cervical spine, nonspecific but likely benign, especially in the setting of early stage disease.    03/24/2018 Echocardiogram   Baseline ECHO 03/24/18 Study Conclusions - Left ventricle: The cavity size was normal. Wall thickness was   increased in a pattern of mild LVH. Systolic function was normal.   The estimated ejection fraction was in the range of 60% to 65%.   Wall motion was normal; there were no regional wall motion   abnormalities. Doppler parameters are consistent with abnormal   left ventricular relaxation (grade 1 diastolic dysfunction). - Right ventricle: The cavity size was mildly dilated. Wall   thickness was normal.   03/25/2018 Imaging   MRI Breast B/l 03/25/18 IMPRESSION: 1. The patient's primary malignancy in the posterior right breast measures 3.2 x 2 x 2.6 cm with apparent invasion of the underlying pectoralis muscle. Numerous surrounding abnormal enhancing masses, consistent with satellite lesions,  extend from 11 o'clock in the right breast into the inferolateral right breast with a total span of suspected disease measuring 4.5 x 4.6 x 6.4 cm in AP, transverse, and craniocaudal  dimension. The suspected extent of disease involves the superior and inferolateral quadrants. The primary malignancy also extends just across midline into the superior medial quadrant. 2. No MRI evidence of malignancy in the left breast. 3. Known metastatic noted in the right axilla.   03/25/2018 Imaging   Whole body bone scan 03/25/18 IMPRESSION: No scintigraphic evidence skeletal metastasis.   03/25/2018 Imaging   CT CAP W Contrast 03/25/18 IMPRESSION: 1. Mass within the deep aspect of the right breast along the pectoralis muscle compatible with primary breast malignancy. 2. There is suggestion of two additional possible masses within the lateral aspect of the right breast versus nodular appearing breast tissue. Recommend dedicated evaluation with bilateral breast MRI. 3. Mildly thickened right axillary lymph node compatible with metastatic adenopathy, recently biopsied. 4. Multiple bilateral pulmonary nodules are indeterminate, potentially sequelae of prior infectious/inflammatory process. Recommend attention on follow-up. Consider follow-up CT in 6 months. 5. Portal venous gas is demonstrated within the left hepatic lobe. Additionally, there is wall thickening of the cecum and ascending colon with small amount of gas within the pericolonic vasculature. Constellation of findings is indeterminate in etiology however may be secondary to colitis at this location with considerations including infectious, inflammatory or ischemic etiologies. Correlate for recent procedure. If patient is not up-to-date for colonic screening, recommend further evaluation with colonoscopy to exclude the possibility of colonic mass within the cecum/ascending colon. 6. These results were called by telephone at the time of interpretation on 03/25/2018 at 9:31 am to Dr. Truitt Merle , who verbally acknowledged these results.   03/30/2018 - 04/08/2019 Chemotherapy   Neoadjuvant TCHP every 3 weeks starting 03/30/18.  Completed 6 cycles of TCHP on 07/23/18.  Continued with maintenance Herceptin/Perjeta q3weeks to complete 1 year.    04/09/2018 Genetic Testing   Negative genetic testing on the [employer] Common Hereditary Cancers Panel. The Common Hereditary Cancers Panel offered by [facility] includes sequencing and/or deletion duplication testing of the following 47 genes: APC, ATM, AXIN2, BARD1, BMPR1A, BRCA1, BRCA2, BRIP1, CDH1, CDKN2A (p14ARF), CDKN2A (p16INK4a), CKD4, CHEK2, CTNNA1, DICER1, EPCAM (Deletion/duplication testing only), GREM1 (promoter region deletion/duplication testing only), KIT, MEN1, MLH1, MSH2, MSH3, MSH6, MUTYH, NBN, NF1, NHTL1, PALB2, PDGFRA, PMS2, POLD1, POLE, PTEN, RAD50, RAD51C, RAD51D, SDHB, SDHC, SDHD, SMAD4, SMARCA4. STK11, TP53, TSC1, TSC2, and VHL.  The following genes were evaluated for sequence changes only: SDHA and HOXB13 c.251G>A variant only.   Genetic testing did detect a Variant of Unknown Significance (VUS) in the POLD1 gene called c.985C>T (p.Pro329Ser). At this time, it is unknown if this variant is associated with increased cancer risk or if this is a normal finding, but most variants such as this get reclassified to being inconsequential. It should not be used to make medical management decisions.   The report date is 04/09/2018.   07/24/2018 Breast MRI   IMPRESSION: 1. Resolution of the enhancing mass (known cancer) and small satellite lesions previously seen in the right breast following chemotherapy.  2. The metastatic lymph node in the right axilla has decreased in size.  3.  No MRI evidence of left breast malignancy.   09/14/2018 Surgery   RIGHT NIPPLE SPARING MASTECTOMY WITH TARGETED DEEP RIGHT AXILLARY LYMPH NODE BIOPSY WITH RADIOACTIVE SEED LOCALIZATION AND RIGHT AXILLARY SENTINEL LYMPH NODE BIOPSY by Dr. Dalbert Batman  09/14/18  09/14/2018 Pathology Results   Diagnosis 1. Lymph node, sentinel, biopsy, right axillary with radioactive seed - ONE OF ONE LYMPH  NODES NEGATIVE FOR CARCINOMA (0/1). - MILD TREATMENT EFFECT. - BIOPSY SITE. 2. Lymph node, sentinel, biopsy, right axillary - ONE OF ONE LYMPH NODES NEGATIVE FOR CARCINOMA (0/1). 3. Lymph node, sentinel, biopsy, right - ONE OF ONE LYMPH NODES NEGATIVE FOR CARCINOMA (0/1). 4. Lymph node, sentinel, biopsy, right - ONE OF ONE LYMPH NODES NEGATIVE FOR CARCINOMA (0/1). 5. Lymph node, sentinel, biopsy, right - ONE OF ONE LYMPH NODES NEGATIVE FOR CARCINOMA (0/1). 6. Lymph node, sentinel, biopsy, right - ONE OF ONE LYMPH NODES NEGATIVE FOR CARCINOMA (0/1). 7. Lymph node, sentinel, biopsy, right - ONE OF ONE LYMPH NODES NEGATIVE FOR CARCINOMA (0/1). 8. Lymph node, sentinel, biopsy, right - ONE OF ONE LYMPH NODES NEGATIVE FOR CARCINOMA (0/1). 9. Lymph node, sentinel, biopsy, right - ONE OF ONE LYMPH NODES NEGATIVE FOR CARCINOMA (0/1). 10. Lymph node, sentinel, biopsy, right - ONE OF ONE LYMPH NODES NEGATIVE FOR CARCINOMA (0/1). 11. Lymph node, sentinel, biopsy, right - ONE OF ONE LYMPH NODES NEGATIVE FOR CARCINOMA (0/1). 12. Lymph node, sentinel, biopsy, right - ONE OF ONE LYMPH NODES NEGATIVE FOR CARCINOMA (0/1). 13. Nipple Biopsy, right - BENIGN NIPPLE TISSUE. 14. Breast, simple mastectomy, right - NO RESIDUAL CARCINOMA IDENTIFIED. - TREATMENT EFFECT. - FIBROCYSTIC CHANGE. - ONE OF ONE LYMPH NODES NEGATIVE FOR CARCINOMA (0/1). - SEE ONCOLOGY TABLE.   09/14/2018 Cancer Staging   Staging form: Breast, AJCC 8th Edition - Pathologic stage from 09/14/2018: No Stage Recommended (ypT0, pN0, cM0, GX, ER: Not Assessed, PR: Not Assessed, HER2: Not Assessed) - Signed by Truitt Merle, MD on 09/23/2018   11/03/2018 - 12/10/2018 Radiation Therapy   Adjuvant Radiation with Dr. Isidore Moos    12/27/2018 -  Anti-estrogen oral therapy   Start Tamoxifen 4m daily in 12/27/2018 stopped after 1 week due to poor toleration. She started anastrozole on 02/04/19 but stopped on 02/14/19 due to worsening muscle aches. She tried  Letrozole but did not tolerate. She was switched to Exemestane in late 04/2019.    01/04/2019 Imaging   CT chest  IMPRESSION: 1. Stable tiny subpleural and perifissural nodules bilaterally. No suspicious pulmonary nodule or mass. 2. No focal airspace disease in the right lung. 3.  Aortic Atherosclerois (ICD10-170.0)     03/08/2019 Imaging   CT AP W Contrast 03/08/19  IMPRESSION: 1. No metastatic disease in the abdomen pelvis. 2. No evidence adverse immunotherapy therapy reaction.   03/08/2019 Imaging   Whole body bone scan 03/08/19  IMPRESSION: No definite scintigraphic evidence of osseous metastases.   08/29/2019 -  Chemotherapy   Started Nerlynx with budesonide on 08/29/19      CURRENT THERAPY:  -Tamoxifen 216mailystarting6/1/2020stopped after 1 week due to poor toleration.She started anastrozole on 02/04/19 but stopped on 02/14/19 due to worsening muscle aches.She tried Letrozole but did not tolerate.She was switched to Exemestane in late10/2020. -Started Nerlynx 24035maily with budesonide on 08/29/19  INTERVAL HISTORY:  JenHonest Safranek here for a follow up of treatment. She notes for the past 3 weeks she has been on full dose 240 mg Nerlynx. She notes she has been having intermittent diarrhea 3 times a day maximum with high output. She notes she is taking imodium. She feels she is drinking enough water and this is overall manageable. She notes she is still on budesonide and she is able to sleep with this when she takes Xanax. She also notes  mild skin rash/breakout on her face. Pimples are starting to go away. She does not feel she needs doxycycline at this point. She also notes mouth sore that is resolving currently. She is able to eat enough .   REVIEW OF SYSTEMS:   Constitutional: Denies fevers, chills or abnormal weight loss Eyes: Denies blurriness of vision Ears, nose, mouth, throat, and face: Denies mucositis or sore throat (+) resolving mouth sore Respiratory:  Denies cough, dyspnea or wheezes Cardiovascular: Denies palpitation, chest discomfort or lower extremity swelling Gastrointestinal:  Denies nausea, heartburn (+) intermittent diarrhea.  Skin: (+) skin rash of face, improving  Lymphatics: Denies new lymphadenopathy or easy bruising Neurological:Denies numbness, tingling or new weaknesses Behavioral/Psych: Mood is stable, no new changes  All other systems were reviewed with the patient and are negative.  MEDICAL HISTORY:  Past Medical History:  Diagnosis Date  . Abdominal pain, unspecified site   . Anxiety state, unspecified   . Complication of anesthesia    convulsions - when she has appendectomy about 12 years ago at [facility]  . Contact dermatitis and other eczema, due to unspecified cause   . Essential hypertension, benign   . Family history of colon cancer   . Family history of melanoma   . Family history of prostate cancer   . GERD (gastroesophageal reflux disease)   . Hematuria, unspecified   . Hypertension   . Lower back pain   . Mixed hyperlipidemia   . Rosacea   . rt breast ca dx'd 01/2018   R breast cancer  . Unspecified symptom associated with female genital organs     SURGICAL HISTORY: Past Surgical History:  Procedure Laterality Date  . BREAST RECONSTRUCTION WITH PLACEMENT OF TISSUE EXPANDER AND ALLODERM Right 09/14/2018   Procedure: BREAST RECONSTRUCTION WITH PLACEMENT OF TISSUE EXPANDER AND ALLODERM;  Surgeon: Irene Limbo, MD;  Location: [facility];  Service: Plastics;  Laterality: Right;  . CESAREAN SECTION    . DIAGNOSTIC LAPAROSCOPY    . EYE SURGERY     lasix  . LAPAROSCOPIC APPENDECTOMY    . LIPOSUCTION WITH LIPOFILLING Right 07/26/2019   Procedure: LIPOFILLING from abdomen to R chest;  Surgeon: Irene Limbo, MD;  Location: [facility];  Service: Plastics;  Laterality: Right;  . MASTECTOMY WITH RADIOACTIVE SEED GUIDED EXCISION AND AXILLARY SENTINEL LYMPH NODE BIOPSY Right 09/14/2018    Procedure: RIGHT NIPPLE SPARING MASTECTOMY WITH TARGETED DEEP RIGHT AXILLARY LYMPH NODE BIOPSY WITH RADIOACTIVE SEED LOCALIZATION AND RIGHT AXILLARY SENTINEL LYMPH NODE BIOPSY, FROZEN SECTION AND POSSIBLE COMPLETION AXILLARY LYMPH NODE DISSECTION INJECT BLUE DYE RIGHT BREAST;  Surgeon: Fanny Skates, MD;  Location: [place] [facility];  Service: General;  Laterality: Right;  . PORT-A-CATH REMOVAL Left 07/26/2019   Procedure: REMOVAL PORT-A-CATH;  Surgeon: Irene Limbo, MD;  Location: [facility];  Service: Plastics;  Laterality: Left;  . PORTACATH PLACEMENT Left 04/13/2018   Procedure: INSERTION PORT-A-CATH WITH [place];  Surgeon: Fanny Skates, MD;  Location: [facility];  Service: General;  Laterality: Left;  . REMOVAL OF TISSUE EXPANDER AND PLACEMENT OF IMPLANT Right 07/26/2019   Procedure: REMOVAL OF TISSUE EXPANDER AND PLACEMENT OF IMPLANT;  Surgeon: Irene Limbo, MD;  Location: [facility];  Service: Plastics;  Laterality: Right;  . UTERINE FIBROID SURGERY      I have reviewed the social history and family history with the patient and they are unchanged from previous note.  ALLERGIES:  is allergic to metrogel [metronidazole]; other; paxil [paroxetine hcl]; prednisone;  and zomig [zolmitriptan].  MEDICATIONS:  Current Outpatient Medications  Medication Sig Dispense Refill  . ALPRAZolam (XANAX) 0.5 MG tablet TAKE 1 TABLET(0.5 MG) BY MOUTH AT BEDTIME AS NEEDED FOR ANXIETY 30 tablet 0  . atenolol (TENORMIN) 50 MG tablet Take 50 mg by mouth at bedtime.    . Budesonide ER 9 MG CP24 Take 9 mg by mouth daily. 30 capsule 0  . cyclobenzaprine (FLEXERIL) 5 MG tablet TAKE 1 TABLET(5 MG) BY MOUTH THREE TIMES DAILY AS NEEDED FOR MUSCLE SPASMS 30 tablet 0  . exemestane (AROMASIN) 25 MG tablet Take 1 tablet (25 mg total) by mouth daily after breakfast. 90 tablet 1  . HYDROcodone-acetaminophen (NORCO) 5-325 MG tablet Take 1-2 tablets by mouth every 4 (four) hours as  needed for moderate pain or severe pain. 40 tablet 0  . Neratinib Maleate (NERLYNX) 40 MG tablet Take 6 tablets (240 mg total) by mouth daily. 180 tablet 0  . omeprazole (PRILOSEC) 20 MG capsule Take 1 capsule (20 mg total) by mouth 2 (two) times daily before a meal. 60 capsule 2  . omeprazole (PRILOSEC) 40 MG capsule Take 40 mg by mouth daily.    Marland Kitchen oxyCODONE (OXY IR/ROXICODONE) 5 MG immediate release tablet Take 5 mg by mouth 4 (four) times daily as needed.    . potassium chloride SA (KLOR-CON) 20 MEQ tablet TAKE 1 TABLET BY MOUTH EVERY DAY 30 tablet 3  . traMADol (ULTRAM) 50 MG tablet Take 1 tablet twice daily as needed for severe pain 30 tablet 0  . venlafaxine (EFFEXOR) 37.5 MG tablet Take 1 tablet (37.5 mg total) by mouth 2 (two) times daily. 30 tablet 3   No current facility-administered medications for this visit.    PHYSICAL EXAMINATION: ECOG PERFORMANCE STATUS: 0 - Asymptomatic  No vitals taken today, Exam not performed today   LABORATORY DATA:  I have reviewed the data as listed CBC Latest Ref Rng & Units 08/23/2019 05/16/2019 03/18/2019  WBC 4.0 - 10.5 K/uL 4.9 4.6 4.7  Hemoglobin 12.0 - 15.0 g/dL 12.3 12.1 11.5(L)  Hematocrit 36.0 - 46.0 % 35.0(L) 33.9(L) 32.5(L)  Platelets 150 - 400 K/uL 162 144(L) 119(L)     CMP Latest Ref Rng & Units 08/23/2019 05/16/2019 03/18/2019  Glucose 70 - 99 mg/dL 98 98 106(H)  BUN 6 - 20 mg/dL _0 Creatinine 0.44 - 1.00 mg/dL 0.70 0.67 0.69  Sodium 135 - 145 mmol/L 144 142 141  Potassium 3.5 - 5.1 mmol/L 3.5 3.7 3.9  Chloride 98 - 111 mmol/L 107 107 107  CO2 22 - 32 mmol/L _1 Calcium 8.9 - 10.3 mg/dL 9.3 9.1 9.0  Total Protein 6.5 - 8.1 g/dL 7.6 7.5 7.4  Total Bilirubin 0.3 - 1.2 mg/dL 0.4 0.5 0.5  Alkaline Phos 38 - 126 U/L 139(H) 155(H) 159(H)  AST 15 - 41 U/L _2 ALT 0 - 44 U/L _3 RADIOGRAPHIC STUDIES: I have personally reviewed the radiological images as listed and agreed with the findings in the  report. No results found.   ASSESSMENT & PLAN:  Claire Guzman is a 54 y.o. female with    1. Malignant neoplasm of upper-inner quadrant of right breast in female, invasive ductal carcinoma, cT2N1M0 stage IB, ER+/PR+/HER2+, G3, ypT0N0 -She was diagnosed in 02/2018. She is s/pneoadjuvantchemoof 6 cyclesTCHP,right breast mastectomyandaxillary lymph node dissectionon 2/18/20and adjuvant radiation.She completed 1 year maintenance Herceptin/Perjeta in 03/2019. -She started antiestrogen therapy in  12/2018. She did not tolerate tamoxifen, Anastrozole or Letrozole. She is currently on Exemestane since 04/2019. Toleratingmuch better with manageable hot flashes andmildjointstiffness. Will continue. -Sheunderwent reconstruction surgery and PAC removal byDr. Iran Planas on 07/26/19. -I discussed given herER+/HER2 positive disease she is eligible for more anti-HER2 treatment with oral Nerlynxfor one year. She started of Nerlynx and budesonide on 08/29/19. She has been able to titrate up to 247m Nerlynx.  -She has been on full dose 2444mfor the past 2-3 weeks and continues Budesonide. She is tolerating well with intermittent diarrhea up to 3 times a day and skin rash/breakout on face and a resolving mouth sore. She is managing trouble sleeping from budesonide with Xanax.  -She will continue to drink more water, use imodium, keep skin clean and use mouth wash. She does not need doxycycline at this time. Will aconitine full dose 24066merlynx, her diarrhea should improve soon.  -Next OV in 2 weeks   2. Right Iron fullness, rule out adenopathy -Onset 06/15/19 after long distant driving.  -Based on prior exam this is trace edema of right arm, this is likely related to her previousbreast surgery. -She has completed PT, US US neck for evaluation was negative. She denies any recent URI symptoms   Plan: -Continue Exemestane, Nerlynx full dose and budesonide -Lab and f/u in 2 weeks    No  problem-specific Assessment & Plan notes found for this encounter.   No orders of the defined types were placed in this encounter.  I discussed the assessment and treatment plan with the patient. The patient was provided an opportunity to ask questions and all were answered. The patient agreed with the plan and demonstrated an understanding of the instructions.  The patient was advised to call back or seek an in-person evaluation if the symptoms worsen or if the condition fails to improve as anticipated.  The total time spent in the appointment was 10 minutes.    YanTruitt MerleD 09/21/2019   I, AmoJoslyn Devonm acting as scribe for YanTruitt MerleD.   I have reviewed the above documentation for accuracy and completeness, and I agree with the above.

## 2019-09-20 ENCOUNTER — Telehealth: Payer: Self-pay | Admitting: Hematology

## 2019-09-20 NOTE — Telephone Encounter (Signed)
Left message for patient to verify phone visit appt for pre reg

## 2019-09-21 ENCOUNTER — Encounter: Payer: Self-pay | Admitting: Hematology

## 2019-09-21 ENCOUNTER — Inpatient Hospital Stay (HOSPITAL_BASED_OUTPATIENT_CLINIC_OR_DEPARTMENT_OTHER): Payer: Medicaid Other | Admitting: Hematology

## 2019-09-21 DIAGNOSIS — Z170 Estrogen receptor positive status [ER+]: Secondary | ICD-10-CM

## 2019-09-21 DIAGNOSIS — C50211 Malignant neoplasm of upper-inner quadrant of right female breast: Secondary | ICD-10-CM

## 2019-09-23 ENCOUNTER — Other Ambulatory Visit: Payer: Self-pay | Admitting: Hematology

## 2019-09-23 DIAGNOSIS — F419 Anxiety disorder, unspecified: Secondary | ICD-10-CM

## 2019-09-27 ENCOUNTER — Other Ambulatory Visit: Payer: Self-pay | Admitting: Nurse Practitioner

## 2019-09-27 DIAGNOSIS — F419 Anxiety disorder, unspecified: Secondary | ICD-10-CM

## 2019-09-28 ENCOUNTER — Other Ambulatory Visit: Payer: Self-pay | Admitting: Nurse Practitioner

## 2019-09-28 DIAGNOSIS — F419 Anxiety disorder, unspecified: Secondary | ICD-10-CM

## 2019-09-28 MED ORDER — ALPRAZOLAM 0.5 MG PO TABS: ORAL_TABLET | ORAL | 0 refills | Status: DC

## 2019-09-28 NOTE — Progress Notes (Signed)
Claire Guzman   [phone] [phone]   Clinic Follow up Note   Patient Care Team: Lujean Amel, MD as PCP - General (Family Medicine) Fanny Skates, MD as Consulting Physician (General Surgery) Truitt Merle, MD as Consulting Physician (Hematology) Eppie Gibson, MD as Attending Physician (Radiation Oncology) Mauro Kaufmann, RN as Oncology Nurse Navigator Rockwell Germany, RN as Oncology Nurse Navigator  Date of Service:  10/05/2019  CHIEF COMPLAINT: F/u for right breast cancer  SUMMARY OF ONCOLOGIC HISTORY: Oncology History Overview Note  Cancer Staging Malignant neoplasm of upper-inner quadrant of right breast in female, estrogen receptor positive ([place]) Staging form: Breast, AJCC 8th Edition - Clinical stage from 03/08/2018: Stage IB (cT2, cN1, cM0, G3, ER+, PR+, HER2+) - Signed by Truitt Merle, MD on 03/16/2018     Malignant neoplasm of upper-inner quadrant of right breast in female, estrogen receptor positive ([place])  02/24/2018 Mammogram   screening mammogram on 02/24/2018 that was benign   03/04/2018 Mammogram   diagnostic mammogram on 03/04/2018 due to a palpable mass on her right breast. Diagnostic mammogram, breast US revealed and biopsy revealed suspicious mass in the right breast at 2:30 at the palpable site of concern and one suspicious right axillary LN.   03/08/2018 Cancer Staging   Staging form: Breast, AJCC 8th Edition - Clinical stage from 03/08/2018: Stage IB (cT2, cN1, cM0, G3, ER+, PR+, HER2+) - Signed by Truitt Merle, MD on 03/16/2018   03/08/2018 Receptors her2   Her2 positive, ER 90% PR 5% and Ki67 60%   03/08/2018 Pathology Results   Diagnosis 1. Breast, right, needle core biopsy, 2:30 - INVASIVE DUCTAL CARCINOMA, GRADE 3. - LYMPHOVASCULAR SPACE INVOLVEMENT BY TUMOR. 2. Lymph node, needle/core biopsy, right axilla - METASTATIC CARCINOMA INVOLVING SCANT LYMPHOID TISSUE.   03/10/2018 Initial Diagnosis   Malignant neoplasm of  upper-inner quadrant of right breast in female, estrogen receptor positive ([place])   03/20/2018 Imaging   MRI brain 03/20/18 IMPRESSION: No evidence of metastatic disease.  Normal appearance of brain. Slightly heterogeneous marrow pattern of the upper cervical spine, nonspecific but likely benign, especially in the setting of early stage disease.    03/24/2018 Echocardiogram   Baseline ECHO 03/24/18 Study Conclusions - Left ventricle: The cavity size was normal. Wall thickness was   increased in a pattern of mild LVH. Systolic function was normal.   The estimated ejection fraction was in the range of 60% to 65%.   Wall motion was normal; there were no regional wall motion   abnormalities. Doppler parameters are consistent with abnormal   left ventricular relaxation (grade 1 diastolic dysfunction). - Right ventricle: The cavity size was mildly dilated. Wall   thickness was normal.   03/25/2018 Imaging   MRI Breast B/l 03/25/18 IMPRESSION: 1. The patient's primary malignancy in the posterior right breast measures 3.2 x 2 x 2.6 cm with apparent invasion of the underlying pectoralis muscle. Numerous surrounding abnormal enhancing masses, consistent with satellite lesions, extend from 11 o'clock in the right breast into the inferolateral right breast with a total span of suspected disease measuring 4.5 x 4.6 x 6.4 cm in AP, transverse, and craniocaudal dimension. The suspected extent of disease involves the superior and inferolateral quadrants. The primary malignancy also extends just across midline into the superior medial quadrant. 2. No MRI evidence of malignancy in the left breast. 3. Known metastatic noted in the right axilla.   03/25/2018 Imaging   Whole body bone scan 03/25/18 IMPRESSION: No scintigraphic evidence skeletal  metastasis.   03/25/2018 Imaging   CT CAP W Contrast 03/25/18 IMPRESSION: 1. Mass within the deep aspect of the right breast along the pectoralis muscle  compatible with primary breast malignancy. 2. There is suggestion of two additional possible masses within the lateral aspect of the right breast versus nodular appearing breast tissue. Recommend dedicated evaluation with bilateral breast MRI. 3. Mildly thickened right axillary lymph node compatible with metastatic adenopathy, recently biopsied. 4. Multiple bilateral pulmonary nodules are indeterminate, potentially sequelae of prior infectious/inflammatory process. Recommend attention on follow-up. Consider follow-up CT in 6 months. 5. Portal venous gas is demonstrated within the left hepatic lobe. Additionally, there is wall thickening of the cecum and ascending colon with small amount of gas within the pericolonic vasculature. Constellation of findings is indeterminate in etiology however may be secondary to colitis at this location with considerations including infectious, inflammatory or ischemic etiologies. Correlate for recent procedure. If patient is not up-to-date for colonic screening, recommend further evaluation with colonoscopy to exclude the possibility of colonic mass within the cecum/ascending colon. 6. These results were called by telephone at the time of interpretation on 03/25/2018 at 9:31 am to Dr. Truitt Merle , who verbally acknowledged these results.   03/30/2018 - 04/08/2019 Chemotherapy   Neoadjuvant TCHP every 3 weeks starting 03/30/18. Completed 6 cycles of TCHP on 07/23/18.  Continued with maintenance Herceptin/Perjeta q3weeks to complete 1 year.    04/09/2018 Genetic Testing   Negative genetic testing on the [facility] Common Hereditary Cancers Panel. The Common Hereditary Cancers Panel offered by [facility] includes sequencing and/or deletion duplication testing of the following 47 genes: APC, ATM, AXIN2, BARD1, BMPR1A, BRCA1, BRCA2, BRIP1, CDH1, CDKN2A (p14ARF), CDKN2A (p16INK4a), CKD4, CHEK2, CTNNA1, DICER1, EPCAM (Deletion/duplication testing only), GREM1 (promoter region  deletion/duplication testing only), KIT, MEN1, MLH1, MSH2, MSH3, MSH6, MUTYH, NBN, NF1, NHTL1, PALB2, PDGFRA, PMS2, POLD1, POLE, PTEN, RAD50, RAD51C, RAD51D, SDHB, SDHC, SDHD, SMAD4, SMARCA4. STK11, TP53, TSC1, TSC2, and VHL.  The following genes were evaluated for sequence changes only: SDHA and HOXB13 c.251G>A variant only.   Genetic testing did detect a Variant of Unknown Significance (VUS) in the POLD1 gene called c.985C>T (p.Pro329Ser). At this time, it is unknown if this variant is associated with increased cancer risk or if this is a normal finding, but most variants such as this get reclassified to being inconsequential. It should not be used to make medical management decisions.   The report date is 04/09/2018.   07/24/2018 Breast MRI   IMPRESSION: 1. Resolution of the enhancing mass (known cancer) and small satellite lesions previously seen in the right breast following chemotherapy.  2. The metastatic lymph node in the right axilla has decreased in size.  3.  No MRI evidence of left breast malignancy.   09/14/2018 Surgery   RIGHT NIPPLE SPARING MASTECTOMY WITH TARGETED DEEP RIGHT AXILLARY LYMPH NODE BIOPSY WITH RADIOACTIVE SEED LOCALIZATION AND RIGHT AXILLARY SENTINEL LYMPH NODE BIOPSY by Dr. Dalbert Batman  09/14/18    09/14/2018 Pathology Results   Diagnosis 1. Lymph node, sentinel, biopsy, right axillary with radioactive seed - ONE OF ONE LYMPH NODES NEGATIVE FOR CARCINOMA (0/1). - MILD TREATMENT EFFECT. - BIOPSY SITE. 2. Lymph node, sentinel, biopsy, right axillary - ONE OF ONE LYMPH NODES NEGATIVE FOR CARCINOMA (0/1). 3. Lymph node, sentinel, biopsy, right - ONE OF ONE LYMPH NODES NEGATIVE FOR CARCINOMA (0/1). 4. Lymph node, sentinel, biopsy, right - ONE OF ONE LYMPH NODES NEGATIVE FOR CARCINOMA (0/1). 5. Lymph node, sentinel, biopsy, right - ONE OF  ONE LYMPH NODES NEGATIVE FOR CARCINOMA (0/1). 6. Lymph node, sentinel, biopsy, right - ONE OF ONE LYMPH NODES NEGATIVE FOR  CARCINOMA (0/1). 7. Lymph node, sentinel, biopsy, right - ONE OF ONE LYMPH NODES NEGATIVE FOR CARCINOMA (0/1). 8. Lymph node, sentinel, biopsy, right - ONE OF ONE LYMPH NODES NEGATIVE FOR CARCINOMA (0/1). 9. Lymph node, sentinel, biopsy, right - ONE OF ONE LYMPH NODES NEGATIVE FOR CARCINOMA (0/1). 10. Lymph node, sentinel, biopsy, right - ONE OF ONE LYMPH NODES NEGATIVE FOR CARCINOMA (0/1). 11. Lymph node, sentinel, biopsy, right - ONE OF ONE LYMPH NODES NEGATIVE FOR CARCINOMA (0/1). 12. Lymph node, sentinel, biopsy, right - ONE OF ONE LYMPH NODES NEGATIVE FOR CARCINOMA (0/1). 13. Nipple Biopsy, right - BENIGN NIPPLE TISSUE. 14. Breast, simple mastectomy, right - NO RESIDUAL CARCINOMA IDENTIFIED. - TREATMENT EFFECT. - FIBROCYSTIC CHANGE. - ONE OF ONE LYMPH NODES NEGATIVE FOR CARCINOMA (0/1). - SEE ONCOLOGY TABLE.   09/14/2018 Cancer Staging   Staging form: Breast, AJCC 8th Edition - Pathologic stage from 09/14/2018: No Stage Recommended (ypT0, pN0, cM0, GX, ER: Not Assessed, PR: Not Assessed, HER2: Not Assessed) - Signed by Truitt Merle, MD on 09/23/2018   11/03/2018 - 12/10/2018 Radiation Therapy   Adjuvant Radiation with Dr. Isidore Moos    12/27/2018 -  Anti-estrogen oral therapy   Start Tamoxifen '20mg'$  daily in 12/27/2018 stopped after 1 week due to poor toleration. She started anastrozole on 02/04/19 but stopped on 02/14/19 due to worsening muscle aches. She tried Letrozole but did not tolerate. She was switched to Exemestane in late 04/2019.    01/04/2019 Imaging   CT chest  IMPRESSION: 1. Stable tiny subpleural and perifissural nodules bilaterally. No suspicious pulmonary nodule or mass. 2. No focal airspace disease in the right lung. 3.  Aortic Atherosclerois (ICD10-170.0)     03/08/2019 Imaging   CT AP W Contrast 03/08/19  IMPRESSION: 1. No metastatic disease in the abdomen pelvis. 2. No evidence adverse immunotherapy therapy reaction.   03/08/2019 Imaging   Whole body bone scan 03/08/19   IMPRESSION: No definite scintigraphic evidence of osseous metastases.   08/29/2019 -  Chemotherapy   Started Nerlynx with budesonide on 08/29/19      CURRENT THERAPY:  -Tamoxifen '20mg'$ dailystarting6/1/2020stopped after 1 week due to poor toleration.She started anastrozole on 02/04/19 but stopped on 02/14/19 due to worsening muscle aches.She tried Letrozole but did not tolerate.She was switched to Exemestane in late10/2020. -StartedNerlynx '240mg'$  daily withbudesonideon 08/29/19. Reduced to '200mg'$  in late 08/2019   INTERVAL HISTORY:  Claire Guzman is here for a follow up of treatment. She presents to the clinic alone. She notes she has intermittent diarrhea. She will take Imodium and will not have diarrhea for 2-3 days then will have loose stool again. She notes she reduced Nerlynx to '200mg'$ . She notes she has been more anxious and has OCD. She has taken Xanax to help her calm down. She is also on Effexor 37.'5mg'$ .    REVIEW OF SYSTEMS:   Constitutional: Denies fevers, chills or abnormal weight loss Eyes: Denies blurriness of vision Ears, nose, mouth, throat, and face: Denies mucositis or sore throat Respiratory: Denies cough, dyspnea or wheezes Cardiovascular: Denies palpitation, chest discomfort or lower extremity swelling Gastrointestinal:  Denies nausea, heartburn or change in bowel habits Skin: Denies abnormal skin rashes Lymphatics: Denies new lymphadenopathy or easy bruising Neurological:Denies numbness, tingling or new weaknesses Behavioral/Psych: Mood is stable, no new changes (+) Anxiety  All other systems were reviewed with the patient and are negative.  MEDICAL HISTORY:  Past Medical History:  Diagnosis Date  . Abdominal pain, unspecified site   . Anxiety state, unspecified   . Complication of anesthesia    convulsions - when she has appendectomy about 12 years ago at [facility]  . Contact dermatitis and other eczema, due to unspecified cause   . Essential  hypertension, benign   . Family history of colon cancer   . Family history of melanoma   . Family history of prostate cancer   . GERD (gastroesophageal reflux disease)   . Hematuria, unspecified   . Hypertension   . Lower back pain   . Mixed hyperlipidemia   . Rosacea   . rt breast ca dx'd 01/2018   R breast cancer  . Unspecified symptom associated with female genital organs     SURGICAL HISTORY: Past Surgical History:  Procedure Laterality Date  . BREAST RECONSTRUCTION WITH PLACEMENT OF TISSUE EXPANDER AND ALLODERM Right 09/14/2018   Procedure: BREAST RECONSTRUCTION WITH PLACEMENT OF TISSUE EXPANDER AND ALLODERM;  Surgeon: Irene Limbo, MD;  Location: [place];  Service: Plastics;  Laterality: Right;  . CESAREAN SECTION    . DIAGNOSTIC LAPAROSCOPY    . EYE SURGERY     lasix  . LAPAROSCOPIC APPENDECTOMY    . LIPOSUCTION WITH LIPOFILLING Right 07/26/2019   Procedure: LIPOFILLING from abdomen to R chest;  Surgeon: Irene Limbo, MD;  Location: [place];  Service: Plastics;  Laterality: Right;  . MASTECTOMY WITH RADIOACTIVE SEED GUIDED EXCISION AND AXILLARY SENTINEL LYMPH NODE BIOPSY Right 09/14/2018   Procedure: RIGHT NIPPLE SPARING MASTECTOMY WITH TARGETED DEEP RIGHT AXILLARY LYMPH NODE BIOPSY WITH RADIOACTIVE SEED LOCALIZATION AND RIGHT AXILLARY SENTINEL LYMPH NODE BIOPSY, FROZEN SECTION AND POSSIBLE COMPLETION AXILLARY LYMPH NODE DISSECTION INJECT BLUE DYE RIGHT BREAST;  Surgeon: Fanny Skates, MD;  Location: [place];  Service: General;  Laterality: Right;  . PORT-A-CATH REMOVAL Left 07/26/2019   Procedure: REMOVAL PORT-A-CATH;  Surgeon: Irene Limbo, MD;  Location: [place];  Service: Plastics;  Laterality: Left;  . PORTACATH PLACEMENT Left 04/13/2018   Procedure: INSERTION PORT-A-CATH WITH [place];  Surgeon: Fanny Skates, MD;  Location: [place];  Service: General;  Laterality: Left;  . REMOVAL OF TISSUE EXPANDER AND  PLACEMENT OF IMPLANT Right 07/26/2019   Procedure: REMOVAL OF TISSUE EXPANDER AND PLACEMENT OF IMPLANT;  Surgeon: Irene Limbo, MD;  Location: [place];  Service: Plastics;  Laterality: Right;  . UTERINE FIBROID SURGERY      I have reviewed the social history and family history with the patient and they are unchanged from previous note.  ALLERGIES:  is allergic to metrogel [metronidazole]; other; paxil [paroxetine hcl]; prednisone; sulfamethoxazole; and zomig [zolmitriptan].  MEDICATIONS:  Current Outpatient Medications  Medication Sig Dispense Refill  . ALPRAZolam (XANAX) 0.5 MG tablet TAKE 1 TABLET(0.5 MG) BY MOUTH AT BEDTIME AS NEEDED FOR ANXIETY 30 tablet 0  . atenolol (TENORMIN) 50 MG tablet Take 50 mg by mouth at bedtime.    . Baclofen 5 MG TABS baclofen 5 mg tablet  TK 1 TO 2 TS PO Q 8 HOURS PRN FOR EYE LID SPASMS    . Budesonide ER 9 MG CP24 Take 9 mg by mouth daily. 30 capsule 0  . cyclobenzaprine (FLEXERIL) 5 MG tablet TAKE 1 TABLET(5 MG) BY MOUTH THREE TIMES DAILY AS NEEDED FOR MUSCLE SPASMS 30 tablet 0  . exemestane (AROMASIN) 25 MG tablet Take 1 tablet (25 mg total) by mouth daily after breakfast.  90 tablet 1  . HYDROcodone-acetaminophen (NORCO) 5-325 MG tablet Take 1-2 tablets by mouth every 4 (four) hours as needed for moderate pain or severe pain. 40 tablet 0  . Neratinib Maleate (NERLYNX) 40 MG tablet Take 5 tablets (200 mg total) by mouth daily. 150 tablet 0  . omeprazole (PRILOSEC) 20 MG capsule Take 1 capsule (20 mg total) by mouth 2 (two) times daily before a meal. 60 capsule 2  . omeprazole (PRILOSEC) 40 MG capsule Take 40 mg by mouth daily.    Marland Kitchen oxyCODONE (OXY IR/ROXICODONE) 5 MG immediate release tablet Take 5 mg by mouth 4 (four) times daily as needed.    . potassium chloride SA (KLOR-CON) 20 MEQ tablet TAKE 1 TABLET BY MOUTH EVERY DAY 30 tablet 3  . traMADol (ULTRAM) 50 MG tablet Take 1 tablet twice daily as needed for severe pain 30 tablet 0    . venlafaxine (EFFEXOR) 37.5 MG tablet Take 1 tablet (37.5 mg total) by mouth 2 (two) times daily. 30 tablet 3  . venlafaxine XR (EFFEXOR-XR) 75 MG 24 hr capsule Take 1 capsule (75 mg total) by mouth daily with breakfast. 30 capsule 2   No current facility-administered medications for this visit.    PHYSICAL EXAMINATION: ECOG PERFORMANCE STATUS: 1 - Symptomatic but completely ambulatory  Vitals:   10/05/19 0921  BP: 139/78  Pulse: (!) 57  Resp: 17  Temp: 98.2 F (36.8 C)  SpO2: 100%   Filed Weights   10/05/19 0921  Weight: 158 lb 8 oz (71.9 kg)    GENERAL:alert, no distress and comfortable SKIN: skin color, texture, turgor are normal, no rashes or significant lesions EYES: normal, Conjunctiva are pink and non-injected, sclera clear  NECK: supple, thyroid normal size, non-tender, without nodularity LYMPH:  no palpable lymphadenopathy in the cervical, axillary LUNGS: clear to auscultation and percussion with normal breathing effort HEART: regular rate & rhythm and no murmurs and no lower extremity edema ABDOMEN:abdomen soft, non-tender and normal bowel sounds Musculoskeletal:no cyanosis of digits and no clubbing  NEURO: alert & oriented x 3 with fluent speech, no focal motor/sensory deficits BREAST: S/p right mastectomy and reconstruction. No palpable mass, nodules or adenopathy bilaterally. Breast exam benign.   LABORATORY DATA:  I have reviewed the data as listed CBC Latest Ref Rng & Units 10/05/2019 08/23/2019 05/16/2019  WBC 4.0 - 10.5 K/uL 4.5 4.9 4.6  Hemoglobin 12.0 - 15.0 g/dL 12.5 12.3 12.1  Hematocrit 36.0 - 46.0 % 35.5(L) 35.0(L) 33.9(L)  Platelets 150 - 400 K/uL 147(L) 162 144(L)     CMP Latest Ref Rng & Units 10/05/2019 08/23/2019 05/16/2019  Glucose 70 - 99 mg/dL 96 98 98  BUN 6 - 20 mg/dL '13 14 13  '$ Creatinine 0.44 - 1.00 mg/dL 0.74 0.70 0.67  Sodium 135 - 145 mmol/L 141 144 142  Potassium 3.5 - 5.1 mmol/L 3.5 3.5 3.7  Chloride 98 - 111 mmol/L 106 107 107   CO2 22 - 32 mmol/L '29 26 23  '$ Calcium 8.9 - 10.3 mg/dL 9.1 9.3 9.1  Total Protein 6.5 - 8.1 g/dL 7.1 7.6 7.5  Total Bilirubin 0.3 - 1.2 mg/dL 0.6 0.4 0.5  Alkaline Phos 38 - 126 U/L 124 139(H) 155(H)  AST 15 - 41 U/L '17 17 24  '$ ALT 0 - 44 U/L '11 12 20      '$ RADIOGRAPHIC STUDIES: I have personally reviewed the radiological images as listed and agreed with the findings in the report. No results found.   ASSESSMENT &  PLAN:  Claire Guzman is a 54 y.o. female with    1. Malignant neoplasm of upper-inner quadrant of right breast in female, invasive ductal carcinoma, cT2N1M0 stage IB, ER+/PR+/HER2+, G3, ypT0N0 -She was diagnosed in 02/2018. She is s/pneoadjuvantchemoof 6 cyclesTCHP,right breast mastectomyandaxillary lymph node dissectionon 2/18/20and adjuvant radiation.She completed 1 year maintenance Herceptin/Perjeta in 03/2019. -She started antiestrogen therapy in 12/2018. She did not tolerate tamoxifen, Anastrozole or Letrozole. She is currently on Exemestane since 04/2019. Toleratingmuch better with manageable hot flashes andmildjointstiffness. Will continue. -Sheunderwent reconstruction surgery and PAC removal byDr. Iran Planas on 07/26/19. -Given herER+/HER2 positive disease I started her on anti-HER2 treatment with oral Nerlynxfor one year. She started of Nerlynx and budesonide on 08/29/19. She has been able to titrate up to '240mg'$  Nerlynx.  -Given intermittent diarrhea and worsening anxiety/OCD she reduce Nerlynx to '200mg'$  in late 08/2019. I discussed continuing for now until her diarrhea improved and she can increase dose again. She is otherwise tolerating well.  -She is clinically doing well. Lab reviewed, her CBC and CMP are within normal limits except Hct 35.5%, plt 147K. Her physical exam unremarkable.  -She will continue Exemestane, '200mg'$  Nerlynx  -F/u in 1 month   2. Right [place] fullness -Onset 06/15/19 after long distant driving.  -Based on priorexamthis  is trace edema of right arm, this is likely related to her previousbreast surgery. -She has completed PT, [place] of neck for evaluation was negative. She denies any recent URI symptoms   3. Anxiety, OCD -She notes she has been more anxious lately with increased OCD.  -I discussed Exemestane can lead to mood swings.  -She is on Xanax 0.'5mg'$  which she has been using nightly recently. She has Effexor 37.'5mg'$  once daily. I will increase Effexor dose to '75mg'$  extended release (10/05/19). She is agreeable.    Plan: -I refilled Nerlynx, she will continue '200mg'$  daily for now, and may increase to '240mg'$  after next visit -I refilled and increased Effexor to '75mg'$  daily  -Continue Exemestane -Lab and f/u in 1 month   No problem-specific Assessment & Plan notes found for this encounter.   No orders of the defined types were placed in this encounter.  All questions were answered. The patient knows to call the clinic with any problems, questions or concerns. No barriers to learning was detected. The total time spent in the appointment was 30 minutes.     Truitt Merle, MD 10/05/2019   I, Joslyn Devon, am acting as scribe for Truitt Merle, MD.   I have reviewed the above documentation for accuracy and completeness, and I agree with the above.

## 2019-10-05 ENCOUNTER — Inpatient Hospital Stay: Payer: Medicaid Other

## 2019-10-05 ENCOUNTER — Encounter: Payer: Self-pay | Admitting: Hematology

## 2019-10-05 ENCOUNTER — Other Ambulatory Visit: Payer: Self-pay

## 2019-10-05 ENCOUNTER — Inpatient Hospital Stay: Payer: Medicaid Other | Attending: Hematology | Admitting: Hematology

## 2019-10-05 VITALS — BP 139/78 | HR 57 | Temp 98.2°F | Resp 17 | Ht 64.0 in | Wt 158.5 lb

## 2019-10-05 DIAGNOSIS — C50211 Malignant neoplasm of upper-inner quadrant of right female breast: Secondary | ICD-10-CM | POA: Diagnosis present

## 2019-10-05 DIAGNOSIS — R197 Diarrhea, unspecified: Secondary | ICD-10-CM | POA: Insufficient documentation

## 2019-10-05 DIAGNOSIS — F419 Anxiety disorder, unspecified: Secondary | ICD-10-CM | POA: Insufficient documentation

## 2019-10-05 DIAGNOSIS — Z170 Estrogen receptor positive status [ER+]: Secondary | ICD-10-CM | POA: Diagnosis not present

## 2019-10-05 DIAGNOSIS — Z79811 Long term (current) use of aromatase inhibitors: Secondary | ICD-10-CM | POA: Diagnosis not present

## 2019-10-05 DIAGNOSIS — Z800 Family history of malignant neoplasm of digestive organs: Secondary | ICD-10-CM | POA: Insufficient documentation

## 2019-10-05 DIAGNOSIS — Z808 Family history of malignant neoplasm of other organs or systems: Secondary | ICD-10-CM | POA: Diagnosis not present

## 2019-10-05 DIAGNOSIS — F429 Obsessive-compulsive disorder, unspecified: Secondary | ICD-10-CM | POA: Diagnosis not present

## 2019-10-05 DIAGNOSIS — R600 Localized edema: Secondary | ICD-10-CM | POA: Diagnosis not present

## 2019-10-05 LAB — CBC WITH DIFFERENTIAL (CANCER CENTER ONLY)
Abs Immature Granulocytes: 0.01 10*3/uL (ref 0.00–0.07)
Basophils Absolute: 0 10*3/uL (ref 0.0–0.1)
Basophils Relative: 0 %
Eosinophils Absolute: 0.2 10*3/uL (ref 0.0–0.5)
Eosinophils Relative: 3 %
HCT: 35.5 % — ABNORMAL LOW (ref 36.0–46.0)
Hemoglobin: 12.5 g/dL (ref 12.0–15.0)
Immature Granulocytes: 0 %
Lymphocytes Relative: 41 %
Lymphs Abs: 1.8 10*3/uL (ref 0.7–4.0)
MCH: 32.6 pg (ref 26.0–34.0)
MCHC: 35.2 g/dL (ref 30.0–36.0)
MCV: 92.7 fL (ref 80.0–100.0)
Monocytes Absolute: 0.3 10*3/uL (ref 0.1–1.0)
Monocytes Relative: 7 %
Neutro Abs: 2.2 10*3/uL (ref 1.7–7.7)
Neutrophils Relative %: 49 %
Platelet Count: 147 10*3/uL — ABNORMAL LOW (ref 150–400)
RBC: 3.83 MIL/uL — ABNORMAL LOW (ref 3.87–5.11)
RDW: 12.4 % (ref 11.5–15.5)
WBC Count: 4.5 10*3/uL (ref 4.0–10.5)
nRBC: 0 % (ref 0.0–0.2)

## 2019-10-05 LAB — CMP (CANCER CENTER ONLY)
ALT: 11 U/L (ref 0–44)
AST: 17 U/L (ref 15–41)
Albumin: 4.1 g/dL (ref 3.5–5.0)
Alkaline Phosphatase: 124 U/L (ref 38–126)
Anion gap: 6 (ref 5–15)
BUN: 13 mg/dL (ref 6–20)
CO2: 29 mmol/L (ref 22–32)
Calcium: 9.1 mg/dL (ref 8.9–10.3)
Chloride: 106 mmol/L (ref 98–111)
Creatinine: 0.74 mg/dL (ref 0.44–1.00)
GFR, Est AFR Am: >60 mL/min (ref >60)
GFR, Estimated: >60 mL/min (ref >60)
Glucose, Bld: 96 mg/dL (ref 70–99)
Potassium: 3.5 mmol/L (ref 3.5–5.1)
Sodium: 141 mmol/L (ref 135–145)
Total Bilirubin: 0.6 mg/dL (ref 0.3–1.2)
Total Protein: 7.1 g/dL (ref 6.5–8.1)

## 2019-10-05 MED ORDER — NERATINIB MALEATE 40 MG PO TABS: 200.0000 mg | ORAL_TABLET | Freq: Every day | ORAL | 0 refills | Status: DC

## 2019-10-05 MED ORDER — VENLAFAXINE HCL ER 75 MG PO CP24: 75.0000 mg | ORAL_CAPSULE | Freq: Every day | ORAL | 2 refills | Status: DC

## 2019-10-06 ENCOUNTER — Telehealth: Payer: Self-pay | Admitting: Hematology

## 2019-10-06 LAB — CANCER ANTIGEN 27.29: CA 27.29: 25.9 U/mL (ref 0.0–38.6)

## 2019-10-06 NOTE — Telephone Encounter (Signed)
Scheduled appt per 3/10 los.  Left a vm of the appt date and time,.

## 2019-10-18 ENCOUNTER — Other Ambulatory Visit: Payer: Self-pay | Admitting: Hematology

## 2019-10-19 ENCOUNTER — Other Ambulatory Visit: Payer: Self-pay | Admitting: Hematology

## 2019-10-19 ENCOUNTER — Telehealth: Payer: Self-pay | Admitting: Hematology

## 2019-10-19 DIAGNOSIS — Z170 Estrogen receptor positive status [ER+]: Secondary | ICD-10-CM

## 2019-10-19 DIAGNOSIS — C50211 Malignant neoplasm of upper-inner quadrant of right female breast: Secondary | ICD-10-CM

## 2019-10-19 NOTE — Telephone Encounter (Signed)
R/s appt per 3/24 sch message - pt is aware of appt date and time

## 2019-10-25 ENCOUNTER — Other Ambulatory Visit: Payer: Self-pay | Admitting: Nurse Practitioner

## 2019-10-25 DIAGNOSIS — F419 Anxiety disorder, unspecified: Secondary | ICD-10-CM

## 2019-11-01 ENCOUNTER — Other Ambulatory Visit: Payer: Self-pay | Admitting: Nurse Practitioner

## 2019-11-01 DIAGNOSIS — F419 Anxiety disorder, unspecified: Secondary | ICD-10-CM

## 2019-11-01 MED ORDER — ALPRAZOLAM 0.5 MG PO TABS: ORAL_TABLET | ORAL | 0 refills | Status: DC

## 2019-11-04 ENCOUNTER — Other Ambulatory Visit: Payer: Medicaid Other

## 2019-11-04 ENCOUNTER — Ambulatory Visit: Payer: Medicaid Other | Admitting: Hematology

## 2019-11-08 ENCOUNTER — Telehealth: Payer: Self-pay

## 2019-11-08 NOTE — Telephone Encounter (Signed)
Claire Guzman called stating she has pain in her right heel with readness and slight swelling.  She states the pain increases as the day progresses.  She states that she has not injured the heel recently.  She states when she elevates the foot there is decrease in pain.  I reviewed with Claire Rue, NP, she does not feel this is coming from the [place].  She recommends elevation, ice and NSAIDS.  If this doesn't help then Claire Guzman can make an appt with [place], her PCP, or emergortho has walk in clinics.  I reviewed this with Claire Guzman she verbalized understanding.

## 2019-12-08 ENCOUNTER — Other Ambulatory Visit: Payer: Self-pay | Admitting: Nurse Practitioner

## 2019-12-08 DIAGNOSIS — F419 Anxiety disorder, unspecified: Secondary | ICD-10-CM

## 2019-12-12 ENCOUNTER — Telehealth: Payer: Self-pay

## 2019-12-12 NOTE — Telephone Encounter (Signed)
Claire Guzman called stating that last week she was having 3-4 liquid stools per day while on the Nerlynx.  She has been taking the Ortikos daily.  She is reluctant to take imodium as she is concerned she will become constipated.  She is wondering if she can increase her dose of Ortikos.

## 2019-12-13 ENCOUNTER — Telehealth: Payer: Self-pay

## 2019-12-13 ENCOUNTER — Other Ambulatory Visit: Payer: Self-pay | Admitting: Emergency Medicine

## 2019-12-13 DIAGNOSIS — F419 Anxiety disorder, unspecified: Secondary | ICD-10-CM

## 2019-12-13 NOTE — Telephone Encounter (Signed)
I spoke to Ms Claire Guzman regarding her diarrhea.  Per Dr. Burr Medico the Ortikos dose can not be adjusted.  I told her Dr. Burr Medico suggests taking 1/2 an imodium tablet to prevent constipation.  Ms Claire Guzman verbalized understanding.

## 2019-12-15 ENCOUNTER — Other Ambulatory Visit: Payer: Self-pay | Admitting: Hematology

## 2019-12-19 ENCOUNTER — Telehealth: Payer: Self-pay

## 2019-12-19 ENCOUNTER — Other Ambulatory Visit: Payer: Self-pay | Admitting: Hematology

## 2019-12-19 DIAGNOSIS — F419 Anxiety disorder, unspecified: Secondary | ICD-10-CM

## 2019-12-19 MED ORDER — ALPRAZOLAM 0.5 MG PO TABS: 0.5000 mg | ORAL_TABLET | Freq: Every evening | ORAL | 0 refills | Status: DC | PRN

## 2019-12-19 NOTE — Telephone Encounter (Signed)
I have refilled her Xanax. Will discuss her symptoms management on her next appointment 6/4, or let me know if she wants to be seen sooner, thanks   Truitt Merle MD

## 2019-12-19 NOTE — Telephone Encounter (Signed)
Ms Claire Guzman called stating that the pharmacy did not get the rx for Xanax.  She also states she is having intermittent stomach pains and nausea.

## 2019-12-28 NOTE — Progress Notes (Signed)
[name]   [phone] [phone]   Clinic Follow up Note   Patient Care Team: Lujean Amel, MD as PCP - General (Family Medicine) Fanny Skates, MD as Consulting Physician (General Surgery) Truitt Merle, MD as Consulting Physician (Hematology) Eppie Gibson, MD as Attending Physician (Radiation Oncology) Mauro Kaufmann, RN as Oncology Nurse Navigator Rockwell Germany, RN as Oncology Nurse Navigator  Date of Service:  12/30/2019  CHIEF COMPLAINT: F/u for right breast cancer  SUMMARY OF ONCOLOGIC HISTORY: Oncology History Overview Note  Cancer Staging Malignant neoplasm of upper-inner quadrant of right breast in female, estrogen receptor positive ([place]) Staging form: Breast, AJCC 8th Edition - Clinical stage from 03/08/2018: Stage IB (cT2, cN1, cM0, G3, ER+, PR+, HER2+) - Signed by Truitt Merle, MD on 03/16/2018     Malignant neoplasm of upper-inner quadrant of right breast in female, estrogen receptor positive ([place])  02/24/2018 Mammogram   screening mammogram on 02/24/2018 that was benign   03/04/2018 Mammogram   diagnostic mammogram on 03/04/2018 due to a palpable mass on her right breast. Diagnostic mammogram, breast US revealed and biopsy revealed suspicious mass in the right breast at 2:30 at the palpable site of concern and one suspicious right axillary LN.   03/08/2018 Cancer Staging   Staging form: Breast, AJCC 8th Edition - Clinical stage from 03/08/2018: Stage IB (cT2, cN1, cM0, G3, ER+, PR+, HER2+) - Signed by Truitt Merle, MD on 03/16/2018   03/08/2018 Receptors her2   Her2 positive, ER 90% PR 5% and Ki67 60%   03/08/2018 Pathology Results   Diagnosis 1. Breast, right, needle core biopsy, 2:30 - INVASIVE DUCTAL CARCINOMA, GRADE 3. - LYMPHOVASCULAR SPACE INVOLVEMENT BY TUMOR. 2. Lymph node, needle/core biopsy, right axilla - METASTATIC CARCINOMA INVOLVING SCANT LYMPHOID TISSUE.   03/10/2018 Initial Diagnosis   Malignant neoplasm of  upper-inner quadrant of right breast in female, estrogen receptor positive ([place])   03/20/2018 Imaging   MRI brain 03/20/18 IMPRESSION: No evidence of metastatic disease.  Normal appearance of brain. Slightly heterogeneous marrow pattern of the upper cervical spine, nonspecific but likely benign, especially in the setting of early stage disease.    03/24/2018 Echocardiogram   Baseline ECHO 03/24/18 Study Conclusions - Left ventricle: The cavity size was normal. Wall thickness was   increased in a pattern of mild LVH. Systolic function was normal.   The estimated ejection fraction was in the range of 60% to 65%.   Wall motion was normal; there were no regional wall motion   abnormalities. Doppler parameters are consistent with abnormal   left ventricular relaxation (grade 1 diastolic dysfunction). - Right ventricle: The cavity size was mildly dilated. Wall   thickness was normal.   03/25/2018 Imaging   MRI Breast B/l 03/25/18 IMPRESSION: 1. The patient's primary malignancy in the posterior right breast measures 3.2 x 2 x 2.6 cm with apparent invasion of the underlying pectoralis muscle. Numerous surrounding abnormal enhancing masses, consistent with satellite lesions, extend from 11 o'clock in the right breast into the inferolateral right breast with a total span of suspected disease measuring 4.5 x 4.6 x 6.4 cm in AP, transverse, and craniocaudal dimension. The suspected extent of disease involves the superior and inferolateral quadrants. The primary malignancy also extends just across midline into the superior medial quadrant. 2. No MRI evidence of malignancy in the left breast. 3. Known metastatic noted in the right axilla.   03/25/2018 Imaging   Whole body bone scan 03/25/18 IMPRESSION: No scintigraphic evidence skeletal  metastasis.   03/25/2018 Imaging   CT CAP W Contrast 03/25/18 IMPRESSION: 1. Mass within the deep aspect of the right breast along the pectoralis muscle  compatible with primary breast malignancy. 2. There is suggestion of two additional possible masses within the lateral aspect of the right breast versus nodular appearing breast tissue. Recommend dedicated evaluation with bilateral breast MRI. 3. Mildly thickened right axillary lymph node compatible with metastatic adenopathy, recently biopsied. 4. Multiple bilateral pulmonary nodules are indeterminate, potentially sequelae of prior infectious/inflammatory process. Recommend attention on follow-up. Consider follow-up CT in 6 months. 5. Portal venous gas is demonstrated within the left hepatic lobe. Additionally, there is wall thickening of the cecum and ascending colon with small amount of gas within the pericolonic vasculature. Constellation of findings is indeterminate in etiology however may be secondary to colitis at this location with considerations including infectious, inflammatory or ischemic etiologies. Correlate for recent procedure. If patient is not up-to-date for colonic screening, recommend further evaluation with colonoscopy to exclude the possibility of colonic mass within the cecum/ascending colon. 6. These results were called by telephone at the time of interpretation on 03/25/2018 at 9:31 am to Dr. Truitt Merle , who verbally acknowledged these results.   03/30/2018 - 04/08/2019 Chemotherapy   Neoadjuvant TCHP every 3 weeks starting 03/30/18. Completed 6 cycles of TCHP on 07/23/18.  Continued with maintenance Herceptin/Perjeta q3weeks to complete 1 year.    04/09/2018 Genetic Testing   Negative genetic testing on the [employer] Common Hereditary Cancers Panel. The Common Hereditary Cancers Panel offered by [facility] includes sequencing and/or deletion duplication testing of the following 47 genes: APC, ATM, AXIN2, BARD1, BMPR1A, BRCA1, BRCA2, BRIP1, CDH1, CDKN2A (p14ARF), CDKN2A (p16INK4a), CKD4, CHEK2, CTNNA1, DICER1, EPCAM (Deletion/duplication testing only), GREM1 (promoter region  deletion/duplication testing only), KIT, MEN1, MLH1, MSH2, MSH3, MSH6, MUTYH, NBN, NF1, NHTL1, PALB2, PDGFRA, PMS2, POLD1, POLE, PTEN, RAD50, RAD51C, RAD51D, SDHB, SDHC, SDHD, SMAD4, SMARCA4. STK11, TP53, TSC1, TSC2, and VHL.  The following genes were evaluated for sequence changes only: SDHA and HOXB13 c.251G>A variant only.   Genetic testing did detect a Variant of Unknown Significance (VUS) in the POLD1 gene called c.985C>T (p.Pro329Ser). At this time, it is unknown if this variant is associated with increased cancer risk or if this is a normal finding, but most variants such as this get reclassified to being inconsequential. It should not be used to make medical management decisions.   The report date is 04/09/2018.   07/24/2018 Breast MRI   IMPRESSION: 1. Resolution of the enhancing mass (known cancer) and small satellite lesions previously seen in the right breast following chemotherapy.  2. The metastatic lymph node in the right axilla has decreased in size.  3.  No MRI evidence of left breast malignancy.   09/14/2018 Surgery   RIGHT NIPPLE SPARING MASTECTOMY WITH TARGETED DEEP RIGHT AXILLARY LYMPH NODE BIOPSY WITH RADIOACTIVE SEED LOCALIZATION AND RIGHT AXILLARY SENTINEL LYMPH NODE BIOPSY by Dr. Dalbert Batman  09/14/18    09/14/2018 Pathology Results   Diagnosis 1. Lymph node, sentinel, biopsy, right axillary with radioactive seed - ONE OF ONE LYMPH NODES NEGATIVE FOR CARCINOMA (0/1). - MILD TREATMENT EFFECT. - BIOPSY SITE. 2. Lymph node, sentinel, biopsy, right axillary - ONE OF ONE LYMPH NODES NEGATIVE FOR CARCINOMA (0/1). 3. Lymph node, sentinel, biopsy, right - ONE OF ONE LYMPH NODES NEGATIVE FOR CARCINOMA (0/1). 4. Lymph node, sentinel, biopsy, right - ONE OF ONE LYMPH NODES NEGATIVE FOR CARCINOMA (0/1). 5. Lymph node, sentinel, biopsy, right - ONE OF  ONE LYMPH NODES NEGATIVE FOR CARCINOMA (0/1). 6. Lymph node, sentinel, biopsy, right - ONE OF ONE LYMPH NODES NEGATIVE FOR  CARCINOMA (0/1). 7. Lymph node, sentinel, biopsy, right - ONE OF ONE LYMPH NODES NEGATIVE FOR CARCINOMA (0/1). 8. Lymph node, sentinel, biopsy, right - ONE OF ONE LYMPH NODES NEGATIVE FOR CARCINOMA (0/1). 9. Lymph node, sentinel, biopsy, right - ONE OF ONE LYMPH NODES NEGATIVE FOR CARCINOMA (0/1). 10. Lymph node, sentinel, biopsy, right - ONE OF ONE LYMPH NODES NEGATIVE FOR CARCINOMA (0/1). 11. Lymph node, sentinel, biopsy, right - ONE OF ONE LYMPH NODES NEGATIVE FOR CARCINOMA (0/1). 12. Lymph node, sentinel, biopsy, right - ONE OF ONE LYMPH NODES NEGATIVE FOR CARCINOMA (0/1). 13. Nipple Biopsy, right - BENIGN NIPPLE TISSUE. 14. Breast, simple mastectomy, right - NO RESIDUAL CARCINOMA IDENTIFIED. - TREATMENT EFFECT. - FIBROCYSTIC CHANGE. - ONE OF ONE LYMPH NODES NEGATIVE FOR CARCINOMA (0/1). - SEE ONCOLOGY TABLE.   09/14/2018 Cancer Staging   Staging form: Breast, AJCC 8th Edition - Pathologic stage from 09/14/2018: No Stage Recommended (ypT0, pN0, cM0, GX, ER: Not Assessed, PR: Not Assessed, HER2: Not Assessed) - Signed by Truitt Merle, MD on 09/23/2018   11/03/2018 - 12/10/2018 Radiation Therapy   Adjuvant Radiation with Dr. Isidore Moos    12/27/2018 -  Anti-estrogen oral therapy   Start Tamoxifen '20mg'$  daily in 12/27/2018 stopped after 1 week due to poor toleration. She started anastrozole on 02/04/19 but stopped on 02/14/19 due to worsening muscle aches. She tried Letrozole but did not tolerate. She was switched to Exemestane in late 04/2019.    01/04/2019 Imaging   CT chest  IMPRESSION: 1. Stable tiny subpleural and perifissural nodules bilaterally. No suspicious pulmonary nodule or mass. 2. No focal airspace disease in the right lung. 3.  Aortic Atherosclerois (ICD10-170.0)     03/08/2019 Imaging   CT AP W Contrast 03/08/19  IMPRESSION: 1. No metastatic disease in the abdomen pelvis. 2. No evidence adverse immunotherapy therapy reaction.   03/08/2019 Imaging   Whole body bone scan 03/08/19   IMPRESSION: No definite scintigraphic evidence of osseous metastases.   08/29/2019 -  Chemotherapy   Started Nerlynx with budesonide on 08/29/19      CURRENT THERAPY:  -Tamoxifen '20mg'$ dailystarting6/1/2020stopped after 1 week due to poor toleration.She started anastrozole on 02/04/19 but stopped on 02/14/19 due to worsening muscle aches.She tried Letrozole but did not tolerate.She was switched to Exemestane in late10/2020. -StartedNerlynx'240mg'$  dailywithbudesonideon 08/29/19. Reduced to '200mg'$  in late 08/2019  INTERVAL HISTORY:  Arnetha Silverthorne is here for a follow up of treatment. She presents to the clinic alone. She notes she has been having intermittent fatigue lately. She notes trouble sleeping which effects how she wakes up. She uses Xanax as needed at night 4-5 times a night. She notes 1 day of missing Effexor she felt very bad. She notes she is able to manage working. She notes she still has diarrhea and she is still on Budesonide. She will have diarrhea with 3-5 BMs daily for a few days a week. She notes she her been feeling stomach issues and occasional mild nausea. She notes her prior back pain has improved. She notes recent skin rash of her left elbow for the past few days, without resolution. She has been using hydrocortisone.  She notes she had a nervous breakdown 1 months ago due to family stress. She notes her body changes since her breast reconstruction and has caused dimpling of her skin. She plans to f/u with Dr Iran Planas.  REVIEW OF SYSTEMS:   Constitutional: Denies fevers, chills or abnormal weight loss (+) Intermittent fatigue (+) trouble sleeping  Eyes: Denies blurriness of vision Ears, nose, mouth, throat, and face: Denies mucositis or sore throat Respiratory: Denies cough, dyspnea or wheezes Cardiovascular: Denies palpitation, chest discomfort or lower extremity swelling Gastrointestinal:  Denies heartburn or change in bowel habits (+) Diarrhea (+) Mild  occasional nausea  Skin: (+) skin rash of left elbow Lymphatics: Denies new lymphadenopathy or easy bruising Neurological:Denies numbness, tingling or new weaknesses Behavioral/Psych: Mood is stable, no new changes  All other systems were reviewed with the patient and are negative.  MEDICAL HISTORY:  Past Medical History:  Diagnosis Date  . Abdominal pain, unspecified site   . Anxiety state, unspecified   . Complication of anesthesia    convulsions - when she has appendectomy about 12 years ago at [facility]  . Contact dermatitis and other eczema, due to unspecified cause   . Essential hypertension, benign   . Family history of colon cancer   . Family history of melanoma   . Family history of prostate cancer   . GERD (gastroesophageal reflux disease)   . Hematuria, unspecified   . Hypertension   . Lower back pain   . Mixed hyperlipidemia   . Rosacea   . rt breast ca dx'd 01/2018   R breast cancer  . Unspecified symptom associated with female genital organs     SURGICAL HISTORY: Past Surgical History:  Procedure Laterality Date  . BREAST RECONSTRUCTION WITH PLACEMENT OF TISSUE EXPANDER AND ALLODERM Right 09/14/2018   Procedure: BREAST RECONSTRUCTION WITH PLACEMENT OF TISSUE EXPANDER AND ALLODERM;  Surgeon: Irene Limbo, MD;  Location: [facility];  Service: Plastics;  Laterality: Right;  . CESAREAN SECTION    . DIAGNOSTIC LAPAROSCOPY    . EYE SURGERY     lasix  . LAPAROSCOPIC APPENDECTOMY    . LIPOSUCTION WITH LIPOFILLING Right 07/26/2019   Procedure: LIPOFILLING from abdomen to R chest;  Surgeon: Irene Limbo, MD;  Location: [facility];  Service: Plastics;  Laterality: Right;  . MASTECTOMY WITH RADIOACTIVE SEED GUIDED EXCISION AND AXILLARY SENTINEL LYMPH NODE BIOPSY Right 09/14/2018   Procedure: RIGHT NIPPLE SPARING MASTECTOMY WITH TARGETED DEEP RIGHT AXILLARY LYMPH NODE BIOPSY WITH RADIOACTIVE SEED LOCALIZATION AND RIGHT AXILLARY SENTINEL LYMPH NODE  BIOPSY, FROZEN SECTION AND POSSIBLE COMPLETION AXILLARY LYMPH NODE DISSECTION INJECT BLUE DYE RIGHT BREAST;  Surgeon: Fanny Skates, MD;  Location: [facility];  Service: General;  Laterality: Right;  . PORT-A-CATH REMOVAL Left 07/26/2019   Procedure: REMOVAL PORT-A-CATH;  Surgeon: Irene Limbo, MD;  Location: [facility];  Service: Plastics;  Laterality: Left;  . PORTACATH PLACEMENT Left 04/13/2018   Procedure: INSERTION PORT-A-CATH WITH [place];  Surgeon: Fanny Skates, MD;  Location: [facility];  Service: General;  Laterality: Left;  . REMOVAL OF TISSUE EXPANDER AND PLACEMENT OF IMPLANT Right 07/26/2019   Procedure: REMOVAL OF TISSUE EXPANDER AND PLACEMENT OF IMPLANT;  Surgeon: Irene Limbo, MD;  Location: [facility];  Service: Plastics;  Laterality: Right;  . UTERINE FIBROID SURGERY      I have reviewed the social history and family history with the patient and they are unchanged from previous note.  ALLERGIES:  is allergic to metrogel [metronidazole]; other; paxil [paroxetine hcl]; prednisone; sulfamethoxazole; and zomig [zolmitriptan].  MEDICATIONS:  Current Outpatient Medications  Medication Sig Dispense Refill  . ALPRAZolam (XANAX) 0.5 MG tablet Take 1 tablet (0.5 mg total) by mouth at  bedtime as needed for anxiety or sleep. 30 tablet 0  . atenolol (TENORMIN) 50 MG tablet Take 50 mg by mouth at bedtime.    . Baclofen 5 MG TABS baclofen 5 mg tablet  TK 1 TO 2 TS PO Q 8 HOURS PRN FOR EYE LID SPASMS    . Budesonide ER (ORTIKOS) 9 MG CP24 Take 1 capsule by mouth daily. 30 capsule 2  . cyclobenzaprine (FLEXERIL) 5 MG tablet TAKE 1 TABLET(5 MG) BY MOUTH THREE TIMES DAILY AS NEEDED FOR MUSCLE SPASMS 30 tablet 0  . exemestane (AROMASIN) 25 MG tablet Take 1 tablet (25 mg total) by mouth daily after breakfast. 90 tablet 1  . HYDROcodone-acetaminophen (NORCO) 5-325 MG tablet Take 1-2 tablets by mouth every 4 (four) hours as needed for moderate pain  or severe pain. 40 tablet 0  . Neratinib Maleate (NERLYNX) 40 MG tablet Take 5 tablets (200 mg total) by mouth daily. Take with food. 150 tablet 3  . omeprazole (PRILOSEC) 20 MG capsule Take 1 capsule (20 mg total) by mouth 2 (two) times daily before a meal. 60 capsule 2  . oxyCODONE (OXY IR/ROXICODONE) 5 MG immediate release tablet Take 5 mg by mouth 4 (four) times daily as needed.    . potassium chloride SA (KLOR-CON) 20 MEQ tablet TAKE 1 TABLET BY MOUTH EVERY DAY 30 tablet 3  . traMADol (ULTRAM) 50 MG tablet Take 1 tablet twice daily as needed for severe pain 30 tablet 0  . venlafaxine XR (EFFEXOR-XR) 75 MG 24 hr capsule Take 1 capsule (75 mg total) by mouth daily with breakfast. 30 capsule 2  . venlafaxine XR (EFFEXOR-XR) 75 MG 24 hr capsule Take 1 capsule (75 mg total) by mouth daily with breakfast. 30 capsule 3   No current facility-administered medications for this visit.    PHYSICAL EXAMINATION: ECOG PERFORMANCE STATUS: 1 - Symptomatic but completely ambulatory  Vitals:   12/30/19 1001  BP: (!) 145/79  Pulse: 60  Resp: 18  Temp: 97.9 F (36.6 C)  SpO2: 100%   Filed Weights   12/30/19 1001  Weight: 160 lb 8 oz (72.8 kg)    GENERAL:alert, no distress and comfortable SKIN: skin color, texture, turgor are normal, no rashes or significant lesions EYES: normal, Conjunctiva are pink and non-injected, sclera clear  NECK: supple, thyroid normal size, non-tender, without nodularity LYMPH:  no palpable lymphadenopathy in the cervical, axillary  LUNGS: clear to auscultation and percussion with normal breathing effort HEART: regular rate & rhythm and no murmurs and no lower extremity edema ABDOMEN:abdomen soft, non-tender and normal bowel sounds Musculoskeletal:no cyanosis of digits and no clubbing  NEURO: alert & oriented x 3 with fluent speech, no focal motor/sensory deficits BREAST: S/p right mastectomy with implant: Surgical incision healed well. No palpable mass, nodules or  adenopathy bilaterally. Breast exam benign.   LABORATORY DATA:  I have reviewed the data as listed CBC Latest Ref Rng & Units 12/30/2019 10/05/2019 08/23/2019  WBC 4.0 - 10.5 K/uL 5.0 4.5 4.9  Hemoglobin 12.0 - 15.0 g/dL 12.3 12.5 12.3  Hematocrit 36.0 - 46.0 % 34.5(L) 35.5(L) 35.0(L)  Platelets 150 - 400 K/uL 167 147(L) 162     CMP Latest Ref Rng & Units 12/30/2019 10/05/2019 08/23/2019  Glucose 70 - 99 mg/dL 96 96 98  BUN 6 - 20 mg/dL '12 13 14  '$ Creatinine 0.44 - 1.00 mg/dL 0.77 0.74 0.70  Sodium 135 - 145 mmol/L 144 141 144  Potassium 3.5 - 5.1 mmol/L 3.6 3.5 3.5  Chloride 98 - 111 mmol/L 105 106 107  CO2 22 - 32 mmol/L '27 29 26  '$ Calcium 8.9 - 10.3 mg/dL 9.9 9.1 9.3  Total Protein 6.5 - 8.1 g/dL 7.2 7.1 7.6  Total Bilirubin 0.3 - 1.2 mg/dL 0.6 0.6 0.4  Alkaline Phos 38 - 126 U/L 122 124 139(H)  AST 15 - 41 U/L 14(L) 17 17  ALT 0 - 44 U/L '11 11 12      '$ RADIOGRAPHIC STUDIES: I have personally reviewed the radiological images as listed and agreed with the findings in the report. No results found.   ASSESSMENT & PLAN:  Claire Guzman is a 54 y.o. female with    1. Malignant neoplasm of upper-inner quadrant of right breast in female, invasive ductal carcinoma, cT2N1M0 stage IB, ER+/PR+/HER2+, G3, ypT0N0 -She was diagnosed in 02/2018. She is s/pneoadjuvantchemoof 6 cyclesTCHP,right breast mastectomyandaxillary lymph node dissectionon 2/18/20and adjuvant radiation.She completed 1 year maintenance Herceptin/Perjeta in 03/2019. -She started antiestrogen therapy in 12/2018. She did not tolerate tamoxifen, Anastrozole or Letrozole. She is currently on Exemestane since 04/2019. Toleratingmuch better with manageable hot flashes andmildjointstiffness. Will continue. -Sheunderwent reconstruction surgery and PAC removal byDr. Iran Planas on 07/26/19. -Given herER+/HER2 positive disease I started her on anti-HER2 treatment with oral Nerlynxfor one year. She started of  Nerlynx and budesonide on 08/29/19. She wasable to titrate up to '240mg'$  Nerlynx.  -Given intermittent diarrhea and worsening anxiety/OCD she reduce Nerlynx to '200mg'$  in late 08/2019. She is still on Budesonide due to persistent diarrhea.  -She has Intermittent diarrhea with 3-5 BMs daily for a few days a week. I encouraged her to use imodium if she has more than 2-3 BMs a day. She also has mild intermittent nausea and fatigue which I discussed could be from Nerlynx or Exemestane.  -Labs reviewed, CBC and CMP WNL. CA 27.29 still pending. Physical exam benign.  -She will continue Exemestane and '200mg'$  dialy Nerlynx.  -F/u in 2 months    2. Anxiety, OCD, trouble sleeping  -She notes she has been more anxious lately with increased OCD.  -I discussed Exemestane can lead to mood swings.  -She is on Xanax 0.'5mg'$  which she has been using 4-5 night a week to help her sleep. I discussed regular use can lead to dependence.  -She has been more anxious and had a Nervous breakdown last month due to family stress. I discussed Exemestane can contribute to her change in mood.  -She is on Effexor 37.'5mg'$  once daily. I will increase Effexor dose to '75mg'$  extended release  4. Osteoporosis  -Her DEXA from 07/2019 showed lowest T-score -2.7 at left total femur. I reviewed this with her.  -I discussed she is on Exemestane which can lower her bone density. She notes her mother has osteoporosis. -I advised her to start Calcium and Vit D once daily.  -I discussed treatment with Bisphosphonate to strengthen her bone. I discussed various options with her but recommend Zometa infusions q39month for 2 years given this can also reduce her risk of bone metastasis from breast cancer. I reviewed side effects with her such as bone aches and jaw necrosis. I recommend she have regular dental cleanings. -She is interested but has not been seen by Dentist in a while. I will refer her to Dr KLawana Chambers    Plan: -I refilled Effexor at '75mg'$ ,  Budesonide, Exemestane, Nerlynx, Oral Potassium  -Continue exemestane '200mg'$  daily and Exemestane -Lab and f/u and Zometa in 2 months  -Send referral to Dr KLawana Chambersfor  dental exam and routine cleaning  No problem-specific Assessment & Plan notes found for this encounter.   Orders Placed This Encounter  Procedures  . Ambulatory referral to Dentistry    Referral Priority:   Routine    Referral Type:   Consultation    Referral Reason:   Specialty Services Required    Requested Specialty:   Dental General Practice    Number of Visits Requested:   1   All questions were answered. The patient knows to call the clinic with any problems, questions or concerns. No barriers to learning was detected. The total time spent in the appointment was 30 minutes.     Truitt Merle, MD 12/30/2019   I, Joslyn Devon, am acting as scribe for Truitt Merle, MD.   I have reviewed the above documentation for accuracy and completeness, and I agree with the above.

## 2019-12-30 ENCOUNTER — Other Ambulatory Visit: Payer: Self-pay

## 2019-12-30 ENCOUNTER — Encounter: Payer: Self-pay | Admitting: Hematology

## 2019-12-30 ENCOUNTER — Inpatient Hospital Stay (HOSPITAL_BASED_OUTPATIENT_CLINIC_OR_DEPARTMENT_OTHER): Payer: Medicaid Other | Admitting: Hematology

## 2019-12-30 ENCOUNTER — Inpatient Hospital Stay: Payer: Medicaid Other | Attending: Hematology

## 2019-12-30 VITALS — BP 145/79 | HR 60 | Temp 97.9°F | Resp 18 | Ht 64.0 in | Wt 160.5 lb

## 2019-12-30 DIAGNOSIS — Z882 Allergy status to sulfonamides status: Secondary | ICD-10-CM | POA: Insufficient documentation

## 2019-12-30 DIAGNOSIS — Z888 Allergy status to other drugs, medicaments and biological substances status: Secondary | ICD-10-CM | POA: Diagnosis not present

## 2019-12-30 DIAGNOSIS — Z881 Allergy status to other antibiotic agents status: Secondary | ICD-10-CM | POA: Insufficient documentation

## 2019-12-30 DIAGNOSIS — Z8042 Family history of malignant neoplasm of prostate: Secondary | ICD-10-CM | POA: Insufficient documentation

## 2019-12-30 DIAGNOSIS — R197 Diarrhea, unspecified: Secondary | ICD-10-CM | POA: Diagnosis not present

## 2019-12-30 DIAGNOSIS — C50211 Malignant neoplasm of upper-inner quadrant of right female breast: Secondary | ICD-10-CM

## 2019-12-30 DIAGNOSIS — R5383 Other fatigue: Secondary | ICD-10-CM | POA: Insufficient documentation

## 2019-12-30 DIAGNOSIS — F419 Anxiety disorder, unspecified: Secondary | ICD-10-CM | POA: Insufficient documentation

## 2019-12-30 DIAGNOSIS — R110 Nausea: Secondary | ICD-10-CM | POA: Diagnosis not present

## 2019-12-30 DIAGNOSIS — C773 Secondary and unspecified malignant neoplasm of axilla and upper limb lymph nodes: Secondary | ICD-10-CM | POA: Diagnosis not present

## 2019-12-30 DIAGNOSIS — Z170 Estrogen receptor positive status [ER+]: Secondary | ICD-10-CM | POA: Diagnosis not present

## 2019-12-30 DIAGNOSIS — Z9011 Acquired absence of right breast and nipple: Secondary | ICD-10-CM | POA: Diagnosis not present

## 2019-12-30 DIAGNOSIS — Z800 Family history of malignant neoplasm of digestive organs: Secondary | ICD-10-CM | POA: Insufficient documentation

## 2019-12-30 DIAGNOSIS — Z79899 Other long term (current) drug therapy: Secondary | ICD-10-CM | POA: Diagnosis not present

## 2019-12-30 DIAGNOSIS — R232 Flushing: Secondary | ICD-10-CM | POA: Insufficient documentation

## 2019-12-30 DIAGNOSIS — Z79811 Long term (current) use of aromatase inhibitors: Secondary | ICD-10-CM | POA: Diagnosis not present

## 2019-12-30 DIAGNOSIS — M2560 Stiffness of unspecified joint, not elsewhere classified: Secondary | ICD-10-CM | POA: Insufficient documentation

## 2019-12-30 DIAGNOSIS — F429 Obsessive-compulsive disorder, unspecified: Secondary | ICD-10-CM | POA: Insufficient documentation

## 2019-12-30 DIAGNOSIS — Z808 Family history of malignant neoplasm of other organs or systems: Secondary | ICD-10-CM | POA: Insufficient documentation

## 2019-12-30 LAB — CBC WITH DIFFERENTIAL (CANCER CENTER ONLY)
Abs Immature Granulocytes: 0.01 10*3/uL (ref 0.00–0.07)
Basophils Absolute: 0 10*3/uL (ref 0.0–0.1)
Basophils Relative: 0 %
Eosinophils Absolute: 0.1 10*3/uL (ref 0.0–0.5)
Eosinophils Relative: 2 %
HCT: 34.5 % — ABNORMAL LOW (ref 36.0–46.0)
Hemoglobin: 12.3 g/dL (ref 12.0–15.0)
Immature Granulocytes: 0 %
Lymphocytes Relative: 35 %
Lymphs Abs: 1.8 10*3/uL (ref 0.7–4.0)
MCH: 33.9 pg (ref 26.0–34.0)
MCHC: 35.7 g/dL (ref 30.0–36.0)
MCV: 95 fL (ref 80.0–100.0)
Monocytes Absolute: 0.3 10*3/uL (ref 0.1–1.0)
Monocytes Relative: 5 %
Neutro Abs: 2.8 10*3/uL (ref 1.7–7.7)
Neutrophils Relative %: 58 %
Platelet Count: 167 10*3/uL (ref 150–400)
RBC: 3.63 MIL/uL — ABNORMAL LOW (ref 3.87–5.11)
RDW: 12.1 % (ref 11.5–15.5)
WBC Count: 5 10*3/uL (ref 4.0–10.5)
nRBC: 0 % (ref 0.0–0.2)

## 2019-12-30 LAB — CMP (CANCER CENTER ONLY)
ALT: 11 U/L (ref 0–44)
AST: 14 U/L — ABNORMAL LOW (ref 15–41)
Albumin: 4 g/dL (ref 3.5–5.0)
Alkaline Phosphatase: 122 U/L (ref 38–126)
Anion gap: 12 (ref 5–15)
BUN: 12 mg/dL (ref 6–20)
CO2: 27 mmol/L (ref 22–32)
Calcium: 9.9 mg/dL (ref 8.9–10.3)
Chloride: 105 mmol/L (ref 98–111)
Creatinine: 0.77 mg/dL (ref 0.44–1.00)
GFR, Est AFR Am: >60 mL/min (ref >60)
GFR, Estimated: >60 mL/min (ref >60)
Glucose, Bld: 96 mg/dL (ref 70–99)
Potassium: 3.6 mmol/L (ref 3.5–5.1)
Sodium: 144 mmol/L (ref 135–145)
Total Bilirubin: 0.6 mg/dL (ref 0.3–1.2)
Total Protein: 7.2 g/dL (ref 6.5–8.1)

## 2019-12-30 MED ORDER — ORTIKOS 9 MG PO CP24: 1.0000 | ORAL_CAPSULE | Freq: Every day | ORAL | 2 refills | Status: DC

## 2019-12-30 MED ORDER — EXEMESTANE 25 MG PO TABS: 25.0000 mg | ORAL_TABLET | Freq: Every day | ORAL | 1 refills | Status: DC

## 2019-12-30 MED ORDER — POTASSIUM CHLORIDE CRYS ER 20 MEQ PO TBCR: EXTENDED_RELEASE_TABLET | ORAL | 3 refills | Status: DC

## 2019-12-30 MED ORDER — VENLAFAXINE HCL ER 75 MG PO CP24: 75.0000 mg | ORAL_CAPSULE | Freq: Every day | ORAL | 3 refills | Status: DC

## 2019-12-30 MED ORDER — NERATINIB MALEATE 40 MG PO TABS: 200.0000 mg | ORAL_TABLET | Freq: Every day | ORAL | 3 refills | Status: DC

## 2019-12-31 LAB — CANCER ANTIGEN 27.29: CA 27.29: 25.1 U/mL (ref 0.0–38.6)

## 2020-01-02 ENCOUNTER — Telehealth: Payer: Self-pay | Admitting: Hematology

## 2020-01-02 NOTE — Telephone Encounter (Signed)
Scheduled appt per 6/4 los.  Spoke with pt and they are aware of the appt date and time.

## 2020-01-03 ENCOUNTER — Ambulatory Visit (HOSPITAL_COMMUNITY): Payer: Self-pay | Admitting: Dentistry

## 2020-01-03 ENCOUNTER — Encounter (HOSPITAL_COMMUNITY): Payer: Self-pay | Admitting: Dentistry

## 2020-01-03 ENCOUNTER — Other Ambulatory Visit: Payer: Self-pay

## 2020-01-03 ENCOUNTER — Telehealth: Payer: Self-pay

## 2020-01-03 VITALS — BP 122/65 | HR 66 | Temp 98.6°F

## 2020-01-03 DIAGNOSIS — Z01818 Encounter for other preprocedural examination: Secondary | ICD-10-CM

## 2020-01-03 DIAGNOSIS — M264 Malocclusion, unspecified: Secondary | ICD-10-CM

## 2020-01-03 DIAGNOSIS — K0530 Chronic periodontitis, unspecified: Secondary | ICD-10-CM

## 2020-01-03 DIAGNOSIS — K06010 Localized gingival recession, unspecified: Secondary | ICD-10-CM

## 2020-01-03 DIAGNOSIS — K036 Deposits [accretions] on teeth: Secondary | ICD-10-CM

## 2020-01-03 DIAGNOSIS — K08409 Partial loss of teeth, unspecified cause, unspecified class: Secondary | ICD-10-CM

## 2020-01-03 DIAGNOSIS — C50211 Malignant neoplasm of upper-inner quadrant of right female breast: Secondary | ICD-10-CM

## 2020-01-03 DIAGNOSIS — M2630 Unspecified anomaly of tooth position of fully erupted tooth or teeth: Secondary | ICD-10-CM

## 2020-01-03 DIAGNOSIS — K0850 Unsatisfactory restoration of tooth, unspecified: Secondary | ICD-10-CM

## 2020-01-03 DIAGNOSIS — Z170 Estrogen receptor positive status [ER+]: Secondary | ICD-10-CM

## 2020-01-03 NOTE — Patient Instructions (Signed)
The patient is to contact Dental Medicine with the name of the dentist that she wishes to be referred to. Dr. Enrique Sack

## 2020-01-03 NOTE — Progress Notes (Signed)
DENTAL CONSULTATION  Date of Consultation:  01/03/2020 Patient Name:   Claire Guzman Date of Birth:   10-22-1965 Medical Record Number: [id number]  COVID 19 SCREENING: The patient does not symptoms concerning for COVID-19 infection (Including fever, chills, cough, or new SHORTNESS OF BREATH).  Patient has had both doses of the Moderna coronavirus vaccine.  VITALS: BP 122/65 (BP Location: Right Arm)   Pulse 66   Temp 98.6 F (37 C)   LMP 03/28/2018 (Within Days)   CHIEF COMPLAINT: Patient referred by Dr. Burr Medico for a dental consultation.  HPI: Claire Guzman is a 54 year old female with history of right breast cancer.  Patient is status post chemoradiation therapy, right mastectomy , and breast reconstruction.  Patient with anticipated use of every 39-month Zometa therapy.  Patient is now seen as part of a medically necessary pre-Zometa therapy dental protocol examination.  The patient currently denies acute toothaches, swellings, or abscesses.  Patient was last seen by a Dentist on Nov 25, 2017 for delivery of a crown on tooth #19.  This was with Dr. Lynnette Caffey in [place], [place].  Prior to that, the patient had not been seen for a long time.  Patient denies having partial dentures.  Patient denies having dental phobia.  PROBLEM LIST: Patient Active Problem List   Diagnosis Date Noted  . Port-A-Cath in place 05/11/2018  . Genetic testing 04/12/2018  . Family history of prostate cancer   . Family history of colon cancer   . Family history of melanoma   . Malignant neoplasm of upper-inner quadrant of right breast in female, estrogen receptor positive ([place]) 03/10/2018    PMH: Past Medical History:  Diagnosis Date  . Abdominal pain, unspecified site   . Anxiety state, unspecified   . Complication of anesthesia    convulsions - when she has appendectomy about 12 years ago [facility]  . Contact dermatitis and other eczema, due to unspecified cause   . Essential  hypertension, benign   . Family history of colon cancer   . Family history of melanoma   . Family history of prostate cancer   . GERD (gastroesophageal reflux disease)   . Hematuria, unspecified   . Hypertension   . Lower back pain   . Mixed hyperlipidemia   . Rosacea   . rt breast ca dx'd 01/2018   R breast cancer  . Unspecified symptom associated with female genital organs     PSH: Past Surgical History:  Procedure Laterality Date  . BREAST RECONSTRUCTION WITH PLACEMENT OF TISSUE EXPANDER AND ALLODERM Right 09/14/2018   Procedure: BREAST RECONSTRUCTION WITH PLACEMENT OF TISSUE EXPANDER AND ALLODERM;  Surgeon: Irene Limbo, MD;  Location: [facility];  Service: Plastics;  Laterality: Right;  . CESAREAN SECTION    . DIAGNOSTIC LAPAROSCOPY    . EYE SURGERY     Lasik  . LAPAROSCOPIC APPENDECTOMY    . LIPOSUCTION WITH LIPOFILLING Right 07/26/2019   Procedure: LIPOFILLING from abdomen to R chest;  Surgeon: Irene Limbo, MD;  Location: [facility];  Service: Plastics;  Laterality: Right;  . MASTECTOMY WITH RADIOACTIVE SEED GUIDED EXCISION AND AXILLARY SENTINEL LYMPH NODE BIOPSY Right 09/14/2018   Procedure: RIGHT NIPPLE SPARING MASTECTOMY WITH TARGETED DEEP RIGHT AXILLARY LYMPH NODE BIOPSY WITH RADIOACTIVE SEED LOCALIZATION AND RIGHT AXILLARY SENTINEL LYMPH NODE BIOPSY, FROZEN SECTION AND POSSIBLE COMPLETION AXILLARY LYMPH NODE DISSECTION INJECT BLUE DYE RIGHT BREAST;  Surgeon: Fanny Skates, MD;  Location: [facility];  Service: General;  Laterality: Right;  .  PORT-A-CATH REMOVAL Left 07/26/2019   Procedure: REMOVAL PORT-A-CATH;  Surgeon: Irene Limbo, MD;  Location: [facility];  Service: Plastics;  Laterality: Left;  . PORTACATH PLACEMENT Left 04/13/2018   Procedure: INSERTION PORT-A-CATH WITH [place];  Surgeon: Fanny Skates, MD;  Location: [facility];  Service: General;  Laterality: Left;  . REMOVAL OF TISSUE EXPANDER AND PLACEMENT OF IMPLANT  Right 07/26/2019   Procedure: REMOVAL OF TISSUE EXPANDER AND PLACEMENT OF IMPLANT;  Surgeon: Irene Limbo, MD;  Location: [facility];  Service: Plastics;  Laterality: Right;  . UTERINE FIBROID SURGERY      ALLERGIES: Allergies  Allergen Reactions  . Metrogel [Metronidazole]     Burned her face  . Other     Dental Cement--tongue and cheek burning  . Paxil [Paroxetine Hcl]     Lethargic/felt bad   . Prednisone     Turns skin red, heart racing  . Sulfamethoxazole Swelling  . Zomig [Zolmitriptan]     Elevated blood pressure     MEDICATIONS: Current Outpatient Medications  Medication Sig Dispense Refill  . ALPRAZolam (XANAX) 0.5 MG tablet Take 1 tablet (0.5 mg total) by mouth at bedtime as needed for anxiety or sleep. 30 tablet 0  . atenolol (TENORMIN) 50 MG tablet Take 50 mg by mouth at bedtime.    . Baclofen 5 MG TABS baclofen 5 mg tablet  TK 1 TO 2 TS PO Q 8 HOURS PRN FOR EYE LID SPASMS    . Budesonide ER (ORTIKOS) 9 MG CP24 Take 1 capsule by mouth daily. 30 capsule 2  . cyclobenzaprine (FLEXERIL) 5 MG tablet TAKE 1 TABLET(5 MG) BY MOUTH THREE TIMES DAILY AS NEEDED FOR MUSCLE SPASMS 30 tablet 0  . exemestane (AROMASIN) 25 MG tablet Take 1 tablet (25 mg total) by mouth daily after breakfast. 90 tablet 1  . HYDROcodone-acetaminophen (NORCO) 5-325 MG tablet Take 1-2 tablets by mouth every 4 (four) hours as needed for moderate pain or severe pain. 40 tablet 0  . Neratinib Maleate (NERLYNX) 40 MG tablet Take 5 tablets (200 mg total) by mouth daily. Take with food. 150 tablet 3  . omeprazole (PRILOSEC) 20 MG capsule Take 1 capsule (20 mg total) by mouth 2 (two) times daily before a meal. 60 capsule 2  . oxyCODONE (OXY IR/ROXICODONE) 5 MG immediate release tablet Take 5 mg by mouth 4 (four) times daily as needed.    . potassium chloride SA (KLOR-CON) 20 MEQ tablet TAKE 1 TABLET BY MOUTH EVERY DAY 30 tablet 3  . traMADol (ULTRAM) 50 MG tablet Take 1 tablet twice daily  as needed for severe pain 30 tablet 0  . venlafaxine XR (EFFEXOR-XR) 75 MG 24 hr capsule Take 1 capsule (75 mg total) by mouth daily with breakfast. 30 capsule 2  . venlafaxine XR (EFFEXOR-XR) 75 MG 24 hr capsule Take 1 capsule (75 mg total) by mouth daily with breakfast. 30 capsule 3   No current facility-administered medications for this visit.    LABS: Lab Results  Component Value Date   WBC 5.0 12/30/2019   HGB 12.3 12/30/2019   HCT 34.5 (L) 12/30/2019   MCV 95.0 12/30/2019   PLT 167 12/30/2019      Component Value Date/Time   NA 144 12/30/2019 0942   K 3.6 12/30/2019 0942   CL 105 12/30/2019 0942   CO2 27 12/30/2019 0942   GLUCOSE 96 12/30/2019 0942   BUN 12 12/30/2019 0942   CREATININE 0.77 12/30/2019 5993  CALCIUM 9.9 12/30/2019 0942   GFRNONAA >60 12/30/2019 0942   GFRAA >60 12/30/2019 0942   No results found for: INR, PROTIME No results found for: PTT  SOCIAL HISTORY: Social History   Socioeconomic History  . Marital status: Married    Spouse name: Not on file  . Number of children: Not on file  . Years of education: Not on file  . Highest education level: Not on file  Occupational History  . Not on file  Tobacco Use  . Smoking status: Never Smoker  . Smokeless tobacco: Never Used  Substance and Sexual Activity  . Alcohol use: No  . Drug use: Never  . Sexual activity: Not on file  Other Topics Concern  . Not on file  Social History Narrative  . Not on file   Social Determinants of Health   Financial Resource Strain:   . Difficulty of Paying Living Expenses:   Food Insecurity:   . Worried About Charity fundraiser in the Last Year:   . Arboriculturist in the Last Year:   Transportation Needs:   . Film/video editor (Medical):   Marland Kitchen Lack of Transportation (Non-Medical):   Physical Activity:   . Days of Exercise per Week:   . Minutes of Exercise per Session:   Stress:   . Feeling of Stress :   Social Connections:   . Frequency of  Communication with Friends and Family:   . Frequency of Social Gatherings with Friends and Family:   . Attends Religious Services:   . Active Member of Clubs or Organizations:   . Attends Archivist Meetings:   Marland Kitchen Marital Status:   Intimate Partner Violence:   . Fear of Current or Ex-Partner:   . Emotionally Abused:   Marland Kitchen Physically Abused:   . Sexually Abused:     FAMILY HISTORY: Family History  Problem Relation Age of Onset  . Heart failure Mother   . Melanoma Mother 48       has had several removed over the past ten years  . Prostate cancer Father 89  . Heart attack Maternal Grandmother   . Heart attack Maternal Grandfather        possible cancer, unknown type  . Stroke Paternal Grandmother   . Colon cancer Paternal Grandfather        dx upper 1s  . Hypertension Other   . Heart Problems Maternal Aunt        d.60  . Heart Problems Maternal Uncle        d.60  . Colon cancer Paternal Aunt        dx 89s  . Colon cancer Paternal Uncle        dx 60s    REVIEW OF SYSTEMS: Reviewed with the patient as per History of present illness. Psych: Patient denies having dental phobia.  DENTAL HISTORY: CHIEF COMPLAINT: Patient referred by Dr. Burr Medico for a dental consultation.  HPI: Claire Guzman is a 54 year old female with history of right breast cancer.  Patient is status post chemoradiation therapy, right mastectomy , and breast reconstruction.  Patient with anticipated use of every 56-month Zometa therapy.  Patient is now seen as part of a medically necessary pre-Zometa therapy dental protocol examination.  The patient currently denies acute toothaches, swellings, or abscesses.  Patient was last seen by a Dentist on Nov 25, 2017 for delivery of a crown on tooth #19.  This was with Dr. Lynnette Caffey in [place], [place].  Prior to that, the patient had not been seen for a long time.  Patient denies having partial dentures.  Patient denies having dental  phobia.  DENTAL EXAMINATION: GENERAL: The patient is well-developed, well-nourished female no acute distress. HEAD AND NECK: There is no palpable neck lymphadenopathy.  The patient denies acute TMJ symptoms. INTRAORAL EXAM: Patient has normal saliva.  There is no evidence of oral abscess formation. DENTITION: Patient is missing tooth numbers 1, 16, 17, and 32.  There are multiple rotated teeth noted. PERIODONTAL: Patient has chronic periodontitis with plaque accumulations, selective areas of gingival recession, and incipient bone loss.  There is no significant tooth mobility. DENTAL CARIES/SUBOPTIMAL RESTORATIONS: No obvious dental caries are noted.  The porcelain crown on tooth #19 has suboptimal margins.   ENDODONTIC: Patient has had previous root canal therapy associated with tooth #29.  There are no persistent periapical radiolucencies or symptoms. CROWN AND BRIDGE: Patient has crown restorations on tooth numbers 19 and 29.  The margins on tooth #19 are suboptimal. PROSTHODONTIC: There are no partial dentures. OCCLUSION: Patient has a poor occlusal scheme but a stable occlusion.  RADIOGRAPHIC INTERPRETATION: Orthopantogram was taken and supplemented with a full series dental radiographs. There are missing tooth numbers 1, 16, 17, and 32.  There is incipient bone loss noted.  There is a previous root canal therapy associated with tooth #29 with no evidence of persistent periapical pathology.  There are crown restorations on tooth numbers 19 and 29.  Multiple rotated teeth are noted.   ASSESSMENTS: 1.  History of right breast cancer 2.  History of chemo radiation therapy  3.  Status post right mastectomy with reconstruction. 4.  Pre-Zometa therapy dental protocol 5.  Chronic periodontitis with bone loss 6.  Gingival recession 7.  Accretions 8.  Missing tooth numbers 1, 16, 17, 32 9.  Multiple rotated teeth 10.  Poor occlusal scheme but a stable occlusion 11.  Anticipated risk for  osteonecrosis of the jaw with invasive dental procedures after administration of Zometa therapy.   PLAN/RECOMMENDATIONS: 1. I discussed the risks, benefits, and complications of various treatment options with the patient in relationship to her medical and dental conditions, anticipated Zometa therapy, and risk for osteonecrosis of the jaw with invasive dental procedures after the administration of Zometa therapy. We discussed various treatment options to include no treatment, periodontal therapy, dental restorations, root canal therapy, crown and bridge therapy, and implant therapy.  We also discussed referral to a primary dentist that accepts Medicaid dental insurance to provide comprehensive dental care including periodontal therapy in the near future. The patient currently wishes to proceed with referral to a new primary dentist that takes dental Medicaid.  Patient will then proceed with initial periodontal therapy as indicated.  Patient understands potential risk for osteonecrosis of the jaw related to the anticipated Zometa therapy.  The patient understands that the risk for osteonecrosis of the jaw with invasive dental procedures increases with the number of doses that will be administered.    2. Discussion of findings with medical team and coordination of future medical and dental care as needed.  I spent in excess of  120 minutes during the conduct of this consultation and >50% of this time involved direct face-to-face encounter for counseling and/or coordination of the patient's care.    Lenn Cal, DDS

## 2020-01-03 NOTE — Telephone Encounter (Signed)
Late note: Claire Guzman called yesterday.  She left vm stating her face is breaking out.  She has red bumps with white centers on her nose and under her eyes.  She stated she is using bendaryl lotion on them.  I returned her call today

## 2020-01-19 ENCOUNTER — Telehealth: Payer: Self-pay | Admitting: Emergency Medicine

## 2020-01-19 ENCOUNTER — Inpatient Hospital Stay (HOSPITAL_BASED_OUTPATIENT_CLINIC_OR_DEPARTMENT_OTHER): Payer: Medicaid Other | Admitting: Medical

## 2020-01-19 ENCOUNTER — Other Ambulatory Visit: Payer: Self-pay

## 2020-01-19 VITALS — BP 137/81 | HR 65 | Temp 97.9°F | Resp 20 | Ht 64.0 in | Wt 159.9 lb

## 2020-01-19 DIAGNOSIS — Z170 Estrogen receptor positive status [ER+]: Secondary | ICD-10-CM | POA: Diagnosis not present

## 2020-01-19 DIAGNOSIS — C50211 Malignant neoplasm of upper-inner quadrant of right female breast: Secondary | ICD-10-CM | POA: Diagnosis not present

## 2020-01-19 DIAGNOSIS — R21 Rash and other nonspecific skin eruption: Secondary | ICD-10-CM

## 2020-01-19 MED ORDER — CEPHALEXIN 500 MG PO CAPS: 500.0000 mg | ORAL_CAPSULE | Freq: Three times a day (TID) | ORAL | 0 refills | Status: DC

## 2020-01-19 NOTE — Telephone Encounter (Signed)
Pt called clinic today c/o of 3 red areas on her right breast. States they feel like there are hard lumps underneath the areas. Pt will come in to see Alfredia Client today in [facility]. Scheduling message sent.

## 2020-01-19 NOTE — Progress Notes (Signed)
[facility]pt[facility] Progress Note   Claire Guzman [id number] March 02, 1966 54 y.o.  Claire Guzman is managed by Dr. Truitt Merle  Actively treated with chemotherapy/immunotherapy/hormonal therapy: yes  Current therapy: Aromasin  Next scheduled appointment with provider: 03/02/2020  Assessment: Plan:    Malignant neoplasm of upper-inner quadrant of right breast in female, estrogen receptor positive ([place])  Rash   ER positive malignant neoplasm of the right breast: The patient continues on daily Aromasin. She is scheduled to be seen in follow up by Dr. Burr Medico on 03/02/2020.  Right breast rash: [name] was given a prescription for Keflex 500 mg 3 times daily. She has been referred to Dr. Irene Limbo for consideration of a biopsy of the rash on her right breast.  Please see After Visit Summary for patient specific instructions.  Future Appointments  Date Time Provider [place]  03/02/2020 10:45 AM [facility] LAB ONLY [facility] None  03/02/2020 11:20 AM Truitt Merle, MD [facility] None  03/02/2020 12:30 PM [facility] INFUSION [facility] None    No orders of the defined types were placed in this encounter.      Subjective:   Patient ID:  Claire Guzman is a 54 y.o. (DOB 09/26/1965) female.  Chief Complaint: No chief complaint on file.   HPI Claire Guzman  is a 54 y.o. female with a diagnosis of an ER positive malignant neoplasm of the right breast. She is followed by Dr. Burr Medico and continues on daily Aromasin. She presents today with a recent new rash of her right breast. She reports that these areas appear thickened. She has noted tenderness in her right lateral breast but not in the areas of thickened tissue and erythema. She has had no changes in personal care products, detergents, lotions, etc. She also notes epigastric tenderness.  Medications: I have reviewed the patient's current medications.  Allergies:  Allergies  Allergen  Reactions   Metrogel [Metronidazole]     Burned her face   Other     Dental Cement--tongue and cheek burning   Paxil [Paroxetine Hcl]     Lethargic/felt bad    Prednisone     Turns skin red, heart racing   Sulfamethoxazole Swelling   Zomig [Zolmitriptan]     Elevated blood pressure     Past Medical History:  Diagnosis Date   Abdominal pain, unspecified site    Anxiety state, unspecified    Complication of anesthesia    convulsions - when she has appendectomy about 12 years ago [facility]   Contact dermatitis and other eczema, due to unspecified cause    Essential hypertension, benign    Family history of colon cancer    Family history of melanoma    Family history of prostate cancer    GERD (gastroesophageal reflux disease)    Hematuria, unspecified    Hypertension    Lower back pain    Mixed hyperlipidemia    Rosacea    rt breast ca dx'd 01/2018   R breast cancer   Unspecified symptom associated with female genital organs     Past Surgical History:  Procedure Laterality Date   BREAST RECONSTRUCTION WITH PLACEMENT OF TISSUE EXPANDER AND ALLODERM Right 09/14/2018   Procedure: BREAST RECONSTRUCTION WITH PLACEMENT OF TISSUE EXPANDER AND ALLODERM;  Surgeon: Irene Limbo, MD;  Location: [facility];  Service: Plastics;  Laterality: Right;   CESAREAN SECTION     DIAGNOSTIC LAPAROSCOPY     EYE SURGERY     Lasik  LAPAROSCOPIC APPENDECTOMY     LIPOSUCTION WITH LIPOFILLING Right 07/26/2019   Procedure: LIPOFILLING from abdomen to R chest;  Surgeon: Irene Limbo, MD;  Location: [facility];  Service: Plastics;  Laterality: Right;   MASTECTOMY WITH RADIOACTIVE SEED GUIDED EXCISION AND AXILLARY SENTINEL LYMPH NODE BIOPSY Right 09/14/2018   Procedure: RIGHT NIPPLE SPARING MASTECTOMY WITH TARGETED DEEP RIGHT AXILLARY LYMPH NODE BIOPSY WITH RADIOACTIVE SEED LOCALIZATION AND RIGHT AXILLARY SENTINEL LYMPH NODE BIOPSY, FROZEN SECTION AND  POSSIBLE COMPLETION AXILLARY LYMPH NODE DISSECTION INJECT BLUE DYE RIGHT BREAST;  Surgeon: Fanny Skates, MD;  Location: [facility];  Service: General;  Laterality: Right;   PORT-A-CATH REMOVAL Left 07/26/2019   Procedure: REMOVAL PORT-A-CATH;  Surgeon: Irene Limbo, MD;  Location: [facility];  Service: Plastics;  Laterality: Left;   PORTACATH PLACEMENT Left 04/13/2018   Procedure: INSERTION PORT-A-CATH WITH [place];  Surgeon: Fanny Skates, MD;  Location: [facility];  Service: General;  Laterality: Left;   REMOVAL OF TISSUE EXPANDER AND PLACEMENT OF IMPLANT Right 07/26/2019   Procedure: REMOVAL OF TISSUE EXPANDER AND PLACEMENT OF IMPLANT;  Surgeon: Irene Limbo, MD;  Location: [facility];  Service: Plastics;  Laterality: Right;   UTERINE FIBROID SURGERY      Family History  Problem Relation Age of Onset   Heart failure Mother    Melanoma Mother 65       has had several removed over the past ten years   Prostate cancer Father 61   Stroke Father    Heart attack Maternal Grandmother    Heart attack Maternal Grandfather        possible cancer, unknown type   Stroke Paternal Grandmother    Colon cancer Paternal Grandfather        dx upper 40s   Hypertension Other    Heart Problems Maternal Aunt        d.60   Heart Problems Maternal Uncle        d.60   Colon cancer Paternal Aunt        dx 63s   Colon cancer Paternal Uncle        dx 33s    Social History   Socioeconomic History   Marital status: Married    Spouse name: Not on file   Number of children: 1   Years of education: Not on file   Highest education level: Not on file  Occupational History   Not on file  Tobacco Use   Smoking status: Never Smoker   Smokeless tobacco: Never Used  Vaping Use   Vaping Use: Never used  Substance and Sexual Activity   Alcohol use: No   Drug use: Never   Sexual activity: Not on file  Other Topics Concern    Not on file  Social History Narrative   Not on file   Social Determinants of Health   Financial Resource Strain:    Difficulty of Paying Living Expenses:   Food Insecurity:    Worried About Charity fundraiser in the Last Year:    Arboriculturist in the Last Year:   Transportation Needs:    Film/video editor (Medical):    Lack of Transportation (Non-Medical):   Physical Activity:    Days of Exercise per Week:    Minutes of Exercise per Session:   Stress:    Feeling of Stress :   Social Connections:    Frequency of Communication with Friends and Family:  Frequency of Social Gatherings with Friends and Family:    Attends Religious Services:    Active Member of Clubs or Organizations:    Attends Music therapist:    Marital Status:   Intimate Partner Violence:    Fear of Current or Ex-Partner:    Emotionally Abused:    Physically Abused:    Sexually Abused:     Past Medical History, Surgical history, Social history, and Family history were reviewed and updated as appropriate.   Please see review of systems for further details on the patient's review from today.   Review of Systems:  Review of Systems  Constitutional: Negative for chills, diaphoresis and fever.  HENT: Negative for trouble swallowing and voice change.   Respiratory: Negative for cough, chest tightness, shortness of breath and wheezing.   Cardiovascular: Negative for chest pain and palpitations.  Gastrointestinal: Positive for abdominal pain. Negative for constipation, diarrhea, nausea and vomiting.  Musculoskeletal: Negative for back pain and myalgias.  Skin: Positive for color change and rash.  Neurological: Negative for dizziness, light-headedness and headaches.    Objective:   Physical Exam:  BP 137/81 (BP Location: Left Arm, Patient Position: Sitting)    Pulse 65    Temp 97.9 F (36.6 C) (Temporal)    Resp 20    Ht 5\' 4"  (1.626 m)    Wt 159 lb 14.4 oz (72.5  kg)    LMP 03/28/2018 (Within Days)    SpO2 99%    BMI 27.45 kg/m  ECOG: 0  Physical Exam Constitutional:      General: She is not in acute distress.    Appearance: She is not diaphoretic.  HENT:     Head: Normocephalic and atraumatic.  Chest:    Lymphadenopathy:     Upper Body:     Right upper body: No supraclavicular, axillary or epitrochlear adenopathy.  Skin:    General: Skin is warm and dry.     Findings: No erythema or rash.  Neurological:     Mental Status: She is alert.     Coordination: Coordination normal.  Psychiatric:        Behavior: Behavior normal.        Thought Content: Thought content normal.        Judgment: Judgment normal.     Lab Review:     Component Value Date/Time   NA 144 12/30/2019 0942   K 3.6 12/30/2019 0942   CL 105 12/30/2019 0942   CO2 27 12/30/2019 0942   GLUCOSE 96 12/30/2019 0942   BUN 12 12/30/2019 0942   CREATININE 0.77 12/30/2019 0942   CALCIUM 9.9 12/30/2019 0942   PROT 7.2 12/30/2019 0942   ALBUMIN 4.0 12/30/2019 0942   AST 14 (L) 12/30/2019 0942   ALT 11 12/30/2019 0942   ALKPHOS 122 12/30/2019 0942   BILITOT 0.6 12/30/2019 0942   GFRNONAA >60 12/30/2019 0942   GFRAA >60 12/30/2019 0942       Component Value Date/Time   WBC 5.0 12/30/2019 0942   WBC 5.9 02/18/2019 1728   RBC 3.63 (L) 12/30/2019 0942   HGB 12.3 12/30/2019 0942   HCT 34.5 (L) 12/30/2019 0942   PLT 167 12/30/2019 0942   MCV 95.0 12/30/2019 0942   MCH 33.9 12/30/2019 0942   MCHC 35.7 12/30/2019 0942   RDW 12.1 12/30/2019 0942   LYMPHSABS 1.8 12/30/2019 0942   MONOABS 0.3 12/30/2019 0942   EOSABS 0.1 12/30/2019 0942   BASOSABS 0.0 12/30/2019 0942   -------------------------------  Imaging from last 24 hours (if applicable):  Radiology interpretation: No results found.

## 2020-01-23 ENCOUNTER — Other Ambulatory Visit: Payer: Self-pay | Admitting: Plastic Surgery

## 2020-01-23 DIAGNOSIS — N63 Unspecified lump in unspecified breast: Secondary | ICD-10-CM

## 2020-01-24 ENCOUNTER — Other Ambulatory Visit: Payer: Self-pay

## 2020-01-24 ENCOUNTER — Other Ambulatory Visit: Payer: Self-pay | Admitting: Plastic Surgery

## 2020-01-24 ENCOUNTER — Ambulatory Visit
Admission: RE | Admit: 2020-01-24 | Discharge: 2020-01-24 | Disposition: A | Discharge status: Home / Self Care | Payer: Medicaid Other | Source: Ambulatory Visit | Attending: Plastic Surgery | Admitting: Plastic Surgery

## 2020-01-24 DIAGNOSIS — N63 Unspecified lump in unspecified breast: Secondary | ICD-10-CM

## 2020-01-26 ENCOUNTER — Telehealth: Payer: Self-pay | Admitting: Hematology

## 2020-01-26 NOTE — Telephone Encounter (Signed)
R/s due to change in providers template. Pt is aware of appts

## 2020-02-20 ENCOUNTER — Telehealth: Payer: Self-pay

## 2020-02-20 NOTE — Telephone Encounter (Signed)
Ms  Peterkin called stating she is having multiple issues.  She has increased snoring and mild muscle spasms in her neck the past 2 weeks which is causing her anxiety. She is experiencing increased fatigue. She has sores in her nostrils and applies neosporin.  Her gums have become red and painful despite good oral care.  She is going to the dentist on Wednesday.  Forwarded to Cira Rue, Per Dr. Burr Medico.

## 2020-02-21 ENCOUNTER — Telehealth: Payer: Self-pay | Admitting: Nurse Practitioner

## 2020-02-21 MED ORDER — CYCLOBENZAPRINE HCL 5 MG PO TABS: 5.0000 mg | ORAL_TABLET | Freq: Three times a day (TID) | ORAL | 0 refills | Status: DC | PRN

## 2020-02-21 NOTE — Telephone Encounter (Signed)
I called Ms. Chaviano back to discuss her concerns. She continues neratinib and exemestane. Has not recently made any other changes to med list. 2-3 weeks ago she developed "twitches" and right neck spasm which is causing her increased anxiety. She had to take a xanax for this this which makes her drowsy so she prefers only to take at night. 3 weeks ago she fell while dealing with her daughter's chickens and hurt her left wrist and buttock. did not injure her head or neck. She also notes her "throat closes" when supine so has to sleep on her side and she is snoring more. She has dry nose with sores, and sensitive red gums. She is using neosporin to nares. She has dentist appointment on Wednesday.   She is likely experiencing muscle spasm in her neck and xerostomia due to neratinib. I agree with dental f/u this week. I recommend biotin rinse for dry mouth, to use humidifier in her room/home to moisten the air, and apply acquaphor or petroleum rather than neosporin to nares. I recommend flexeril PRN for muscle spasm which I sent in to her pharmacy. She will let me know if symptoms worsen or fail to improve. She can wait to be seen at her scheduled visit on 03/02/20. She appreciates the call.   Cira Rue, NP

## 2020-02-22 ENCOUNTER — Other Ambulatory Visit: Payer: Self-pay | Admitting: Hematology

## 2020-02-22 DIAGNOSIS — F419 Anxiety disorder, unspecified: Secondary | ICD-10-CM

## 2020-02-23 ENCOUNTER — Other Ambulatory Visit: Payer: Self-pay | Admitting: Hematology

## 2020-02-23 DIAGNOSIS — F419 Anxiety disorder, unspecified: Secondary | ICD-10-CM

## 2020-02-23 MED ORDER — ALPRAZOLAM 0.5 MG PO TABS: 0.5000 mg | ORAL_TABLET | Freq: Every evening | ORAL | 0 refills | Status: DC | PRN

## 2020-02-27 ENCOUNTER — Other Ambulatory Visit: Payer: Self-pay | Admitting: Hematology

## 2020-02-27 NOTE — Progress Notes (Signed)
[place]   [phone] [phone]   Clinic Follow up Note   Patient Care Team: Lujean Amel, MD as PCP - General (Family Medicine) Fanny Skates, MD as Consulting Physician (General Surgery) Truitt Merle, MD as Consulting Physician (Hematology) Eppie Gibson, MD as Attending Physician (Radiation Oncology) Mauro Kaufmann, RN as Oncology Nurse Navigator Rockwell Germany, RN as Oncology Nurse Navigator  Date of Service:  03/02/2020  CHIEF COMPLAINT: F/u for right breast cancer  SUMMARY OF ONCOLOGIC HISTORY: Oncology History Overview Note  Cancer Staging Malignant neoplasm of upper-inner quadrant of right breast in female, estrogen receptor positive ([place]) Staging form: Breast, AJCC 8th Edition - Clinical stage from 03/08/2018: Stage IB (cT2, cN1, cM0, G3, ER+, PR+, HER2+) - Signed by Truitt Merle, MD on 03/16/2018     Malignant neoplasm of upper-inner quadrant of right breast in female, estrogen receptor positive ([place])  02/24/2018 Mammogram   screening mammogram on 02/24/2018 that was benign   03/04/2018 Mammogram   diagnostic mammogram on 03/04/2018 due to a palpable mass on her right breast. Diagnostic mammogram, breast US revealed and biopsy revealed suspicious mass in the right breast at 2:30 at the palpable site of concern and one suspicious right axillary LN.   03/08/2018 Cancer Staging   Staging form: Breast, AJCC 8th Edition - Clinical stage from 03/08/2018: Stage IB (cT2, cN1, cM0, G3, ER+, PR+, HER2+) - Signed by Truitt Merle, MD on 03/16/2018   03/08/2018 Receptors her2   Her2 positive, ER 90% PR 5% and Ki67 60%   03/08/2018 Pathology Results   Diagnosis 1. Breast, right, needle core biopsy, 2:30 - INVASIVE DUCTAL CARCINOMA, GRADE 3. - LYMPHOVASCULAR SPACE INVOLVEMENT BY TUMOR. 2. Lymph node, needle/core biopsy, right axilla - METASTATIC CARCINOMA INVOLVING SCANT LYMPHOID TISSUE.   03/10/2018 Initial Diagnosis   Malignant neoplasm of  upper-inner quadrant of right breast in female, estrogen receptor positive ([place])   03/20/2018 Imaging   MRI brain 03/20/18 IMPRESSION: No evidence of metastatic disease.  Normal appearance of brain. Slightly heterogeneous marrow pattern of the upper cervical spine, nonspecific but likely benign, especially in the setting of early stage disease.    03/24/2018 Echocardiogram   Baseline ECHO 03/24/18 Study Conclusions - Left ventricle: The cavity size was normal. Wall thickness was   increased in a pattern of mild LVH. Systolic function was normal.   The estimated ejection fraction was in the range of 60% to 65%.   Wall motion was normal; there were no regional wall motion   abnormalities. Doppler parameters are consistent with abnormal   left ventricular relaxation (grade 1 diastolic dysfunction). - Right ventricle: The cavity size was mildly dilated. Wall   thickness was normal.   03/25/2018 Imaging   MRI Breast B/l 03/25/18 IMPRESSION: 1. The patient's primary malignancy in the posterior right breast measures 3.2 x 2 x 2.6 cm with apparent invasion of the underlying pectoralis muscle. Numerous surrounding abnormal enhancing masses, consistent with satellite lesions, extend from 11 o'clock in the right breast into the inferolateral right breast with a total span of suspected disease measuring 4.5 x 4.6 x 6.4 cm in AP, transverse, and craniocaudal dimension. The suspected extent of disease involves the superior and inferolateral quadrants. The primary malignancy also extends just across midline into the superior medial quadrant. 2. No MRI evidence of malignancy in the left breast. 3. Known metastatic noted in the right axilla.   03/25/2018 Imaging   Whole body bone scan 03/25/18 IMPRESSION: No scintigraphic evidence skeletal  metastasis.   03/25/2018 Imaging   CT CAP W Contrast 03/25/18 IMPRESSION: 1. Mass within the deep aspect of the right breast along the pectoralis muscle  compatible with primary breast malignancy. 2. There is suggestion of two additional possible masses within the lateral aspect of the right breast versus nodular appearing breast tissue. Recommend dedicated evaluation with bilateral breast MRI. 3. Mildly thickened right axillary lymph node compatible with metastatic adenopathy, recently biopsied. 4. Multiple bilateral pulmonary nodules are indeterminate, potentially sequelae of prior infectious/inflammatory process. Recommend attention on follow-up. Consider follow-up CT in 6 months. 5. Portal venous gas is demonstrated within the left hepatic lobe. Additionally, there is wall thickening of the cecum and ascending colon with small amount of gas within the pericolonic vasculature. Constellation of findings is indeterminate in etiology however may be secondary to colitis at this location with considerations including infectious, inflammatory or ischemic etiologies. Correlate for recent procedure. If patient is not up-to-date for colonic screening, recommend further evaluation with colonoscopy to exclude the possibility of colonic mass within the cecum/ascending colon. 6. These results were called by telephone at the time of interpretation on 03/25/2018 at 9:31 am to Dr. Truitt Merle , who verbally acknowledged these results.   03/30/2018 - 04/08/2019 Chemotherapy   Neoadjuvant TCHP every 3 weeks starting 03/30/18. Completed 6 cycles of TCHP on 07/23/18.  Continued with maintenance Herceptin/Perjeta q3weeks to complete 1 year.    04/09/2018 Genetic Testing   Negative genetic testing on the [facility] Common Hereditary Cancers Panel. The Common Hereditary Cancers Panel offered by [facility] includes sequencing and/or deletion duplication testing of the following 47 genes: APC, ATM, AXIN2, BARD1, BMPR1A, BRCA1, BRCA2, BRIP1, CDH1, CDKN2A (p14ARF), CDKN2A (p16INK4a), CKD4, CHEK2, CTNNA1, DICER1, EPCAM (Deletion/duplication testing only), GREM1 (promoter region  deletion/duplication testing only), KIT, MEN1, MLH1, MSH2, MSH3, MSH6, MUTYH, NBN, NF1, NHTL1, PALB2, PDGFRA, PMS2, POLD1, POLE, PTEN, RAD50, RAD51C, RAD51D, SDHB, SDHC, SDHD, SMAD4, SMARCA4. STK11, TP53, TSC1, TSC2, and VHL.  The following genes were evaluated for sequence changes only: SDHA and HOXB13 c.251G>A variant only.   Genetic testing did detect a Variant of Unknown Significance (VUS) in the POLD1 gene called c.985C>T (p.Pro329Ser). At this time, it is unknown if this variant is associated with increased cancer risk or if this is a normal finding, but most variants such as this get reclassified to being inconsequential. It should not be used to make medical management decisions.   The report date is 04/09/2018.   07/24/2018 Breast MRI   IMPRESSION: 1. Resolution of the enhancing mass (known cancer) and small satellite lesions previously seen in the right breast following chemotherapy.  2. The metastatic lymph node in the right axilla has decreased in size.  3.  No MRI evidence of left breast malignancy.   09/14/2018 Surgery   RIGHT NIPPLE SPARING MASTECTOMY WITH TARGETED DEEP RIGHT AXILLARY LYMPH NODE BIOPSY WITH RADIOACTIVE SEED LOCALIZATION AND RIGHT AXILLARY SENTINEL LYMPH NODE BIOPSY by Dr. Dalbert Batman  09/14/18    09/14/2018 Pathology Results   Diagnosis 1. Lymph node, sentinel, biopsy, right axillary with radioactive seed - ONE OF ONE LYMPH NODES NEGATIVE FOR CARCINOMA (0/1). - MILD TREATMENT EFFECT. - BIOPSY SITE. 2. Lymph node, sentinel, biopsy, right axillary - ONE OF ONE LYMPH NODES NEGATIVE FOR CARCINOMA (0/1). 3. Lymph node, sentinel, biopsy, right - ONE OF ONE LYMPH NODES NEGATIVE FOR CARCINOMA (0/1). 4. Lymph node, sentinel, biopsy, right - ONE OF ONE LYMPH NODES NEGATIVE FOR CARCINOMA (0/1). 5. Lymph node, sentinel, biopsy, right - ONE OF  ONE LYMPH NODES NEGATIVE FOR CARCINOMA (0/1). 6. Lymph node, sentinel, biopsy, right - ONE OF ONE LYMPH NODES NEGATIVE FOR  CARCINOMA (0/1). 7. Lymph node, sentinel, biopsy, right - ONE OF ONE LYMPH NODES NEGATIVE FOR CARCINOMA (0/1). 8. Lymph node, sentinel, biopsy, right - ONE OF ONE LYMPH NODES NEGATIVE FOR CARCINOMA (0/1). 9. Lymph node, sentinel, biopsy, right - ONE OF ONE LYMPH NODES NEGATIVE FOR CARCINOMA (0/1). 10. Lymph node, sentinel, biopsy, right - ONE OF ONE LYMPH NODES NEGATIVE FOR CARCINOMA (0/1). 11. Lymph node, sentinel, biopsy, right - ONE OF ONE LYMPH NODES NEGATIVE FOR CARCINOMA (0/1). 12. Lymph node, sentinel, biopsy, right - ONE OF ONE LYMPH NODES NEGATIVE FOR CARCINOMA (0/1). 13. Nipple Biopsy, right - BENIGN NIPPLE TISSUE. 14. Breast, simple mastectomy, right - NO RESIDUAL CARCINOMA IDENTIFIED. - TREATMENT EFFECT. - FIBROCYSTIC CHANGE. - ONE OF ONE LYMPH NODES NEGATIVE FOR CARCINOMA (0/1). - SEE ONCOLOGY TABLE.   09/14/2018 Cancer Staging   Staging form: Breast, AJCC 8th Edition - Pathologic stage from 09/14/2018: No Stage Recommended (ypT0, pN0, cM0, GX, ER: Not Assessed, PR: Not Assessed, HER2: Not Assessed) - Signed by Truitt Merle, MD on 09/23/2018   11/03/2018 - 12/10/2018 Radiation Therapy   Adjuvant Radiation with Dr. Isidore Moos    12/27/2018 -  Anti-estrogen oral therapy   Start Tamoxifen 40m daily in 12/27/2018 stopped after 1 week due to poor toleration. She started anastrozole on 02/04/19 but stopped on 02/14/19 due to worsening muscle aches. She tried Letrozole but did not tolerate. She was switched to Exemestane in late 04/2019.    01/04/2019 Imaging   CT chest  IMPRESSION: 1. Stable tiny subpleural and perifissural nodules bilaterally. No suspicious pulmonary nodule or mass. 2. No focal airspace disease in the right lung. 3.  Aortic Atherosclerois (ICD10-170.0)     03/08/2019 Imaging   CT AP W Contrast 03/08/19  IMPRESSION: 1. No metastatic disease in the abdomen pelvis. 2. No evidence adverse immunotherapy therapy reaction.   03/08/2019 Imaging   Whole body bone scan 03/08/19   IMPRESSION: No definite scintigraphic evidence of osseous metastases.   08/29/2019 -  Chemotherapy   Started Nerlynx with budesonide on 08/29/19      CURRENT THERAPY:  -Tamoxifen 219mailystarting6/1/2020stopped after 1 week due to poor toleration.She started anastrozole on 02/04/19 but stopped on 02/14/19 due to worsening muscle aches.She tried Letrozole but did not tolerate.She was switched to Exemestane in late10/2020. -StartedNerlynx24026mailywithbudesonideon 08/29/19. Reduced to 200m33m late 08/2019  INTERVAL HISTORY:  Claire Scaliahere for a follow up. She presents to the clinic alone. She notes she stepped in hole and twisted her right ankle and has residual pain up her hip and back. She notes she can walk on it. She also notes right shoulder muscle spasm along with soreness. She also notes twitching of her right lower posterior neck. She notes her energy fluctuates.  She notes she has been moderately tolerating Nerlynx. She controls Diarrhea with imodium. She also notes dry mouth and nose. Dr SilvQuincy Simmondse her biotene.   She notes her skin change of her right breast and chest has resolved. She notes she used left over oxycodone and muscle relaxer twice to help her spasms.    REVIEW OF SYSTEMS:   Constitutional: Denies fevers, chills or abnormal weight loss Eyes: Denies blurriness of vision Ears, nose, mouth, throat, and face: Denies mucositis or sore throat Respiratory: Denies cough, dyspnea or wheezes Cardiovascular: Denies palpitation, chest discomfort or lower extremity swelling Gastrointestinal:  Denies  nausea, heartburn (+) Diarrhea  Skin: Denies abnormal skin rashes Lymphatics: Denies new lymphadenopathy or easy bruising MSK: (+) Right ankle pain (+) muscle spasms and twitching or right shoulder and right posterior head.  Neurological:Denies numbness, tingling or new weaknesses Behavioral/Psych: Mood is stable, no new changes  All other systems were  reviewed with the patient and are negative.  MEDICAL HISTORY:  Past Medical History:  Diagnosis Date  . Abdominal pain, unspecified site   . Anxiety state, unspecified   . Complication of anesthesia    convulsions - when she has appendectomy about 12 years ago at [facility]  . Contact dermatitis and other eczema, due to unspecified cause   . Essential hypertension, benign   . Family history of colon cancer   . Family history of melanoma   . Family history of prostate cancer   . GERD (gastroesophageal reflux disease)   . Hematuria, unspecified   . Hypertension   . Lower back pain   . Mixed hyperlipidemia   . Rosacea   . rt breast ca dx'd 01/2018   R breast cancer  . Unspecified symptom associated with female genital organs     SURGICAL HISTORY: Past Surgical History:  Procedure Laterality Date  . BREAST RECONSTRUCTION WITH PLACEMENT OF TISSUE EXPANDER AND ALLODERM Right 09/14/2018   Procedure: BREAST RECONSTRUCTION WITH PLACEMENT OF TISSUE EXPANDER AND ALLODERM;  Surgeon: Irene Limbo, MD;  Location: [place];  Service: Plastics;  Laterality: Right;  . CESAREAN SECTION    . DIAGNOSTIC LAPAROSCOPY    . EYE SURGERY     Lasik  . LAPAROSCOPIC APPENDECTOMY    . LIPOSUCTION WITH LIPOFILLING Right 07/26/2019   Procedure: LIPOFILLING from abdomen to R chest;  Surgeon: Irene Limbo, MD;  Location: [place];  Service: Plastics;  Laterality: Right;  . MASTECTOMY WITH RADIOACTIVE SEED GUIDED EXCISION AND AXILLARY SENTINEL LYMPH NODE BIOPSY Right 09/14/2018   Procedure: RIGHT NIPPLE SPARING MASTECTOMY WITH TARGETED DEEP RIGHT AXILLARY LYMPH NODE BIOPSY WITH RADIOACTIVE SEED LOCALIZATION AND RIGHT AXILLARY SENTINEL LYMPH NODE BIOPSY, FROZEN SECTION AND POSSIBLE COMPLETION AXILLARY LYMPH NODE DISSECTION INJECT BLUE DYE RIGHT BREAST;  Surgeon: Fanny Skates, MD;  Location: [place];  Service: General;  Laterality: Right;  . PORT-A-CATH REMOVAL Left 07/26/2019   Procedure:  REMOVAL PORT-A-CATH;  Surgeon: Irene Limbo, MD;  Location: [place];  Service: Plastics;  Laterality: Left;  . PORTACATH PLACEMENT Left 04/13/2018   Procedure: INSERTION PORT-A-CATH WITH [place];  Surgeon: Fanny Skates, MD;  Location: [place];  Service: General;  Laterality: Left;  . REMOVAL OF TISSUE EXPANDER AND PLACEMENT OF IMPLANT Right 07/26/2019   Procedure: REMOVAL OF TISSUE EXPANDER AND PLACEMENT OF IMPLANT;  Surgeon: Irene Limbo, MD;  Location: [place];  Service: Plastics;  Laterality: Right;  . UTERINE FIBROID SURGERY      I have reviewed the social history and family history with the patient and they are unchanged from previous note.  ALLERGIES:  is allergic to metrogel [metronidazole], other, paxil [paroxetine hcl], prednisone, sulfamethoxazole, and zomig [zolmitriptan].  MEDICATIONS:  Current Outpatient Medications  Medication Sig Dispense Refill  . ALPRAZolam (XANAX) 0.5 MG tablet Take 1 tablet (0.5 mg total) by mouth at bedtime as needed for anxiety or sleep. 30 tablet 0  . atenolol (TENORMIN) 50 MG tablet Take 50 mg by mouth at bedtime.    . cephALEXin (KEFLEX) 500 MG capsule Take 1 capsule (500 mg total) by mouth 3 (three) times daily. 21  capsule 0  . cyclobenzaprine (FLEXERIL) 5 MG tablet Take 1 tablet (5 mg total) by mouth 3 (three) times daily as needed for muscle spasms. 30 tablet 0  . exemestane (AROMASIN) 25 MG tablet Take 1 tablet (25 mg total) by mouth daily after breakfast. 90 tablet 1  . Neratinib Maleate (NERLYNX) 40 MG tablet Take 5 tablets (200 mg total) by mouth daily. Take with food. 150 tablet 3  . omeprazole (PRILOSEC) 20 MG capsule Take 1 capsule (20 mg total) by mouth 2 (two) times daily before a meal. (Patient not taking: Reported on 01/03/2020) 60 capsule 2  . potassium chloride SA (KLOR-CON) 20 MEQ tablet TAKE 1 TABLET BY MOUTH EVERY DAY 30 tablet 3  . traMADol (ULTRAM) 50 MG tablet Take 1 tablet  twice daily as needed for severe pain (Patient not taking: Reported on 01/03/2020) 30 tablet 0  . venlafaxine XR (EFFEXOR-XR) 75 MG 24 hr capsule Take 1 capsule (75 mg total) by mouth daily with breakfast. 30 capsule 2  . venlafaxine XR (EFFEXOR-XR) 75 MG 24 hr capsule Take 1 capsule (75 mg total) by mouth daily with breakfast. 30 capsule 3   No current facility-administered medications for this visit.    PHYSICAL EXAMINATION: ECOG PERFORMANCE STATUS: 1 - Symptomatic but completely ambulatory  Vitals:   03/02/20 1055  BP: 132/65  Pulse: 65  Resp: 18  Temp: 98.3 F (36.8 C)  SpO2: 99%   Filed Weights   03/02/20 1055  Weight: 160 lb (72.6 kg)    GENERAL:alert, no distress and comfortable SKIN: skin color, texture, turgor are normal, no rashes or significant lesions EYES: normal, Conjunctiva are pink and non-injected, sclera clear  NECK: supple, thyroid normal size, non-tender, without nodularity LYMPH:  no palpable lymphadenopathy in the cervical, axillary  LUNGS: clear to auscultation and percussion with normal breathing effort HEART: regular rate & rhythm and no murmurs and no lower extremity edema ABDOMEN:abdomen soft, non-tender and normal bowel sounds (+) Tenderness of right shoulder, upper right chest. Symmetrical upper chest  Musculoskeletal:no cyanosis of digits and no clubbing  NEURO: alert & oriented x 3 with fluent speech, no focal motor/sensory deficits BREAST: s/p right mastectomy and reconstruction with implant in place. No palpable mass, nodules or adenopathy bilaterally. Breast exam benign.   LABORATORY DATA:  I have reviewed the data as listed CBC Latest Ref Rng & Units 03/02/2020 12/30/2019 10/05/2019  WBC 4.0 - 10.5 K/uL 6.1 5.0 4.5  Hemoglobin 12.0 - 15.0 g/dL 13.2 12.3 12.5  Hematocrit 36 - 46 % 36.9 34.5(L) 35.5(L)  Platelets 150 - 400 K/uL 201 167 147(L)     CMP Latest Ref Rng & Units 03/02/2020 12/30/2019 10/05/2019  Glucose 70 - 99 mg/dL 107(H) 96 96  BUN 6  - 20 mg/dL _0 Creatinine 0.44 - 1.00 mg/dL 0.73 0.77 0.74  Sodium 135 - 145 mmol/L 141 144 141  Potassium 3.5 - 5.1 mmol/L 3.2(L) 3.6 3.5  Chloride 98 - 111 mmol/L 107 105 106  CO2 22 - 32 mmol/L _1 Calcium 8.9 - 10.3 mg/dL 9.8 9.9 9.1  Total Protein 6.5 - 8.1 g/dL 7.7 7.2 7.1  Total Bilirubin 0.3 - 1.2 mg/dL 0.8 0.6 0.6  Alkaline Phos 38 - 126 U/L 124 122 124  AST 15 - 41 U/L 16 14(L) 17  ALT 0 - 44 U/L _2 RADIOGRAPHIC STUDIES: I have personally reviewed the radiological images as listed and agreed with  the findings in the report. No results found.   ASSESSMENT & PLAN:  Claire Guzman is a 54 y.o. female with    1. Malignant neoplasm of upper-inner quadrant of right breast in female, invasive ductal carcinoma, cT2N1M0 stage IB, ER+/PR+/HER2+, G3, ypT0N0 -She was diagnosed in 02/2018. She is s/pneoadjuvantchemoof 6 cyclesTCHP,right breast mastectomyandaxillary lymph node dissectionon 2/18/20and adjuvant radiation.She completed 1 year maintenance Herceptin/Perjeta in 03/2019. -She started antiestrogen therapy in 12/2018. She did not tolerate tamoxifen, Anastrozole or Letrozole. She is currently on Exemestane since 04/2019. Toleratingmuch better with manageable hot flashes andmildjointstiffness. Will continue. -Sheunderwent reconstruction surgery and PAC removal byDr. Iran Planas on 07/26/19. -I started her on anti-HER2 treatment with oral Nerlynxfor one year. She started of Nerlynx and budesonide on 08/29/19. She was initiallyable to titrate up to 277m Nerlynx daily. -Given intermittent diarrheaand worsening anxiety/OCDshe reduce Nerlynx to 2074min late 08/2019. She is still on Budesonide due to persistent diarrhea.  -She has Intermittent diarrhea with 3-5 BMs daily for a few days a week. I encouraged her to use imodium if she has more than 2-3 BMs a day. She also has mild intermittent nausea and fatigue which I discussed could be from  Nerlynx or Exemestane.  -She notes for the past 3 weeks she has had muscle spasms of right shoulder with soreness and twitching of right posterior head. Physical exam today shows mild tenderness of her upper right chest .  -She was seen to have benign fat necrosis of right breast on 01/24/20 Mammogram, no evidence of malignancy.  -Labs reviewed and adequate to continue Nerlynx. She is still on Budesonide but will  -Continue Exemestane  -F/u in 2 months    2. Anxiety, OCD, insomnia  -She notes she has been more anxious lately with increased OCD.  -I discussed Exemestane can lead to mood swings.  -She is on Xanax 0.39m19mhich she has been using 4-5 night a week to help her sleep. I discussed regular use can lead to dependence.  -She has been more anxious and had a Nervous breakdown last month due to family stress. I discussed Exemestane can contribute to her change in mood.  -She is on Effexor 37.39mg33mce daily. I will increase Effexor dose to 739mg77mended release   4. Osteoporosis  -Her DEXA from 07/2019 showed lowest T-score -2.7 at left total femur. I reviewed this with her.  -I discussed she is on Exemestane which can lower her bone density. She notes her mother has osteoporosis. -I advised her to start Calcium and Vit D once daily.  -I discussed treatment with Bisphosphonate to strengthen her bone and reduce risk of bone metastasis from breast cancer. ions q6mont68month2 years given this can also reduce her risk of bone metastasis from breast cancer. I reviewed side effects with her such as bone aches and jaw necrosis. I recommend she have regular dental cleanings. -She is interested but has not been seen by Dentist in a while. I will refer her to Dr KulinsLawana Chambers. Diarrhea, Hypokalemia  -She has diarrhea secondary to Nerlynx. Mostly controlled on Imodium. She can titrate up as needed without using full dose.  -She still uses Budesonide since starting Nerlynx. I discussed she can try  stopping to see how her diarrhea responds. She can restart if needed.  -She has Hypokalemia secondary to her diarrhea.  -She has noted muscle spasms, twitching and soreness of upper right shoulder and neck. I discussed her low potassium contributes to this. -K  at 3.2 today (03/02/20). I recommend she restart oral potassium daily and increase potassium in diet. I also encourage her to take OTC magnesium -If muscle spasms does not improve next month I will order CT head and Neck.    Plan: -Proceed with Zometa today  -Continue Nerlynx  -Continue Exemestane  -Lab and f/u and Zometa in 2 months    No problem-specific Assessment & Plan notes found for this encounter.   No orders of the defined types were placed in this encounter.  All questions were answered. The patient knows to call the clinic with any problems, questions or concerns. No barriers to learning was detected. The total time spent in the appointment was 30 minutes.     Truitt Merle, MD 03/02/2020   I, Joslyn Devon, am acting as scribe for Truitt Merle, MD.   I have reviewed the above documentation for accuracy and completeness, and I agree with the above.

## 2020-03-02 ENCOUNTER — Ambulatory Visit: Payer: Medicaid Other

## 2020-03-02 ENCOUNTER — Ambulatory Visit: Payer: Medicaid Other | Admitting: Hematology

## 2020-03-02 ENCOUNTER — Inpatient Hospital Stay: Payer: Medicaid Other

## 2020-03-02 ENCOUNTER — Inpatient Hospital Stay (HOSPITAL_BASED_OUTPATIENT_CLINIC_OR_DEPARTMENT_OTHER): Payer: Medicaid Other | Admitting: Hematology

## 2020-03-02 ENCOUNTER — Inpatient Hospital Stay: Payer: Medicaid Other | Attending: Hematology

## 2020-03-02 ENCOUNTER — Other Ambulatory Visit: Payer: Medicaid Other

## 2020-03-02 ENCOUNTER — Other Ambulatory Visit: Payer: Self-pay

## 2020-03-02 VITALS — BP 132/65 | HR 65 | Temp 98.3°F | Resp 18 | Wt 160.0 lb

## 2020-03-02 DIAGNOSIS — C50211 Malignant neoplasm of upper-inner quadrant of right female breast: Secondary | ICD-10-CM

## 2020-03-02 DIAGNOSIS — Z170 Estrogen receptor positive status [ER+]: Secondary | ICD-10-CM

## 2020-03-02 DIAGNOSIS — Z95828 Presence of other vascular implants and grafts: Secondary | ICD-10-CM

## 2020-03-02 DIAGNOSIS — M810 Age-related osteoporosis without current pathological fracture: Secondary | ICD-10-CM | POA: Diagnosis not present

## 2020-03-02 LAB — CBC WITH DIFFERENTIAL (CANCER CENTER ONLY)
Abs Immature Granulocytes: 0.02 10*3/uL (ref 0.00–0.07)
Basophils Absolute: 0 10*3/uL (ref 0.0–0.1)
Basophils Relative: 0 %
Eosinophils Absolute: 0.1 10*3/uL (ref 0.0–0.5)
Eosinophils Relative: 1 %
HCT: 36.9 % (ref 36.0–46.0)
Hemoglobin: 13.2 g/dL (ref 12.0–15.0)
Immature Granulocytes: 0 %
Lymphocytes Relative: 36 %
Lymphs Abs: 2.2 10*3/uL (ref 0.7–4.0)
MCH: 32.9 pg (ref 26.0–34.0)
MCHC: 35.8 g/dL (ref 30.0–36.0)
MCV: 92 fL (ref 80.0–100.0)
Monocytes Absolute: 0.2 10*3/uL (ref 0.1–1.0)
Monocytes Relative: 4 %
Neutro Abs: 3.5 10*3/uL (ref 1.7–7.7)
Neutrophils Relative %: 59 %
Platelet Count: 201 10*3/uL (ref 150–400)
RBC: 4.01 MIL/uL (ref 3.87–5.11)
RDW: 12.5 % (ref 11.5–15.5)
WBC Count: 6.1 10*3/uL (ref 4.0–10.5)
nRBC: 0 % (ref 0.0–0.2)

## 2020-03-02 LAB — CMP (CANCER CENTER ONLY)
ALT: 11 U/L (ref 0–44)
AST: 16 U/L (ref 15–41)
Albumin: 4.4 g/dL (ref 3.5–5.0)
Alkaline Phosphatase: 124 U/L (ref 38–126)
Anion gap: 11 (ref 5–15)
BUN: 10 mg/dL (ref 6–20)
CO2: 23 mmol/L (ref 22–32)
Calcium: 9.8 mg/dL (ref 8.9–10.3)
Chloride: 107 mmol/L (ref 98–111)
Creatinine: 0.73 mg/dL (ref 0.44–1.00)
GFR, Est AFR Am: >60 mL/min (ref >60)
GFR, Estimated: >60 mL/min (ref >60)
Glucose, Bld: 107 mg/dL — ABNORMAL HIGH (ref 70–99)
Potassium: 3.2 mmol/L — ABNORMAL LOW (ref 3.5–5.1)
Sodium: 141 mmol/L (ref 135–145)
Total Bilirubin: 0.8 mg/dL (ref 0.3–1.2)
Total Protein: 7.7 g/dL (ref 6.5–8.1)

## 2020-03-02 MED ORDER — SODIUM CHLORIDE 0.9 % IV SOLN
Freq: Once | INTRAVENOUS | Status: AC
  Filled 2020-03-02: qty 250

## 2020-03-02 MED ORDER — ZOLEDRONIC ACID 4 MG/100ML IV SOLN
INTRAVENOUS | Status: AC
  Filled 2020-03-02: qty 100

## 2020-03-02 MED ORDER — ZOLEDRONIC ACID 4 MG/100ML IV SOLN
4.0000 mg | Freq: Once | INTRAVENOUS | Status: AC
  Administered 2020-03-02: 4 mg via INTRAVENOUS

## 2020-03-02 NOTE — Patient Instructions (Signed)
Zoledronic Acid injection (Hypercalcemia, Oncology) What is this medicine? ZOLEDRONIC ACID (ZOE le dron ik AS id) lowers the amount of calcium loss from bone. It is used to treat too much calcium in your blood from cancer. It is also used to prevent complications of cancer that has spread to the bone. This medicine may be used for other purposes; ask your health care provider or pharmacist if you have questions. COMMON BRAND NAME(S): Zometa What should I tell my health care provider before I take this medicine? They need to know if you have any of these conditions:  aspirin-sensitive asthma  cancer, especially if you are receiving medicines used to treat cancer  dental disease or wear dentures  infection  kidney disease  receiving corticosteroids like dexamethasone or prednisone  an unusual or allergic reaction to zoledronic acid, other medicines, foods, dyes, or preservatives  pregnant or trying to get pregnant  breast-feeding How should I use this medicine? This medicine is for infusion into a vein. It is given by a health care professional in a hospital or clinic setting. Talk to your pediatrician regarding the use of this medicine in children. Special care may be needed. Overdosage: If you think you have taken too much of this medicine contact a poison control center or emergency room at once. NOTE: This medicine is only for you. Do not share this medicine with others. What if I miss a dose? It is important not to miss your dose. Call your doctor or health care professional if you are unable to keep an appointment. What may interact with this medicine?  certain antibiotics given by injection  NSAIDs, medicines for pain and inflammation, like ibuprofen or naproxen  some diuretics like bumetanide, furosemide  teriparatide  thalidomide This list may not describe all possible interactions. Give your health care provider a list of all the medicines, herbs, non-prescription  drugs, or dietary supplements you use. Also tell them if you smoke, drink alcohol, or use illegal drugs. Some items may interact with your medicine. What should I watch for while using this medicine? Visit your doctor or health care professional for regular checkups. It may be some time before you see the benefit from this medicine. Do not stop taking your medicine unless your doctor tells you to. Your doctor may order blood tests or other tests to see how you are doing. Women should inform their doctor if they wish to become pregnant or think they might be pregnant. There is a potential for serious side effects to an unborn child. Talk to your health care professional or pharmacist for more information. You should make sure that you get enough calcium and vitamin D while you are taking this medicine. Discuss the foods you eat and the vitamins you take with your health care professional. Some people who take this medicine have severe bone, joint, and/or muscle pain. This medicine may also increase your risk for jaw problems or a broken thigh bone. Tell your doctor right away if you have severe pain in your jaw, bones, joints, or muscles. Tell your doctor if you have any pain that does not go away or that gets worse. Tell your dentist and dental surgeon that you are taking this medicine. You should not have major dental surgery while on this medicine. See your dentist to have a dental exam and fix any dental problems before starting this medicine. Take good care of your teeth while on this medicine. Make sure you see your dentist for regular follow-up   appointments. What side effects may I notice from receiving this medicine? Side effects that you should report to your doctor or health care professional as soon as possible:  allergic reactions like skin rash, itching or hives, swelling of the face, lips, or tongue  anxiety, confusion, or depression  breathing problems  changes in vision  eye  pain  feeling faint or lightheaded, falls  jaw pain, especially after dental work  mouth sores  muscle cramps, stiffness, or weakness  redness, blistering, peeling or loosening of the skin, including inside the mouth  trouble passing urine or change in the amount of urine Side effects that usually do not require medical attention (report to your doctor or health care professional if they continue or are bothersome):  bone, joint, or muscle pain  constipation  diarrhea  fever  hair loss  irritation at site where injected  loss of appetite  nausea, vomiting  stomach upset  trouble sleeping  trouble swallowing  weak or tired This list may not describe all possible side effects. Call your doctor for medical advice about side effects. You may report side effects to FDA at 1-800-FDA-1088. Where should I keep my medicine? This drug is given in a hospital or clinic and will not be stored at home. NOTE: This sheet is a summary. It may not cover all possible information. If you have questions about this medicine, talk to your doctor, pharmacist, or health care provider.  2020 [employer] (2013-12-10 14:19:39)  

## 2020-03-03 LAB — CANCER ANTIGEN 27.29: CA 27.29: 28.2 U/mL (ref 0.0–38.6)

## 2020-03-04 ENCOUNTER — Encounter: Payer: Self-pay | Admitting: Hematology

## 2020-03-05 ENCOUNTER — Telehealth: Payer: Self-pay | Admitting: Hematology

## 2020-03-05 NOTE — Telephone Encounter (Signed)
Scheduled per 8/6 los. Unable to reach pt. Left voicemail with appt time and date. 

## 2020-03-26 ENCOUNTER — Other Ambulatory Visit: Payer: Self-pay | Admitting: Hematology

## 2020-03-26 DIAGNOSIS — Z1231 Encounter for screening mammogram for malignant neoplasm of breast: Secondary | ICD-10-CM

## 2020-03-27 ENCOUNTER — Other Ambulatory Visit: Payer: Self-pay | Admitting: Hematology

## 2020-03-27 DIAGNOSIS — C50211 Malignant neoplasm of upper-inner quadrant of right female breast: Secondary | ICD-10-CM

## 2020-03-27 DIAGNOSIS — Z170 Estrogen receptor positive status [ER+]: Secondary | ICD-10-CM

## 2020-03-27 DIAGNOSIS — F419 Anxiety disorder, unspecified: Secondary | ICD-10-CM

## 2020-04-06 ENCOUNTER — Other Ambulatory Visit: Payer: Self-pay | Admitting: Hematology

## 2020-04-06 DIAGNOSIS — F419 Anxiety disorder, unspecified: Secondary | ICD-10-CM

## 2020-04-06 MED ORDER — ALPRAZOLAM 0.5 MG PO TABS: 0.5000 mg | ORAL_TABLET | Freq: Every evening | ORAL | 0 refills | Status: DC | PRN

## 2020-04-10 ENCOUNTER — Telehealth: Payer: Self-pay

## 2020-04-10 NOTE — Telephone Encounter (Signed)
Ms Greeley left vm stating she has doubled up on effexor.  She stated she had "flu like" symptoms and increased hot flashes so she increased her effexor.  After the increase the symptoms went away.  I left a vm for her to call me back.

## 2020-04-17 ENCOUNTER — Telehealth: Payer: Self-pay

## 2020-04-17 ENCOUNTER — Other Ambulatory Visit: Payer: Self-pay

## 2020-04-17 DIAGNOSIS — F419 Anxiety disorder, unspecified: Secondary | ICD-10-CM

## 2020-04-17 DIAGNOSIS — C50211 Malignant neoplasm of upper-inner quadrant of right female breast: Secondary | ICD-10-CM

## 2020-04-17 DIAGNOSIS — Z170 Estrogen receptor positive status [ER+]: Secondary | ICD-10-CM

## 2020-04-17 MED ORDER — VENLAFAXINE HCL ER 150 MG PO CP24: 150.0000 mg | ORAL_CAPSULE | Freq: Every day | ORAL | 5 refills | Status: DC

## 2020-04-17 NOTE — Telephone Encounter (Signed)
Claire Guzman returned my call. I let her know Dr. Burr Medico agreed with increasing her dose of effexor.  She verbalized understanding.

## 2020-05-02 NOTE — Progress Notes (Signed)
[facility]   [phone] Fax:([phone]   Clinic Follow up Note   Patient Care Team: Darrow Bussing, MD as PCP - General (Family Medicine) Claud Kelp, MD as Consulting Physician (General Surgery) Malachy Mood, MD as Consulting Physician (Hematology) Lonie Peak, MD as Attending Physician (Radiation Oncology) Pershing Proud, RN as Oncology Nurse Navigator Donnelly Angelica, RN as Oncology Nurse Navigator  Date of Service:  05/04/2020  CHIEF COMPLAINT: F/u for right breast cancer  SUMMARY OF ONCOLOGIC HISTORY: Oncology History Overview Note  Cancer Staging Malignant neoplasm of upper-inner quadrant of right breast in female, estrogen receptor positive (HCC) Staging form: Breast, AJCC 8th Edition - Clinical stage from 03/08/2018: Stage IB (cT2, cN1, cM0, G3, ER+, PR+, HER2+) - Signed by Malachy Mood, MD on 03/16/2018     Malignant neoplasm of upper-inner quadrant of right breast in female, estrogen receptor positive (HCC)  02/24/2018 Mammogram   screening mammogram on 02/24/2018 that was benign   03/04/2018 Mammogram   diagnostic mammogram on 03/04/2018 due to a palpable mass on her right breast. Diagnostic mammogram, breast US revealed and biopsy revealed suspicious mass in the right breast at 2:30 at the palpable site of concern and one suspicious right axillary LN.   03/08/2018 Cancer Staging   Staging form: Breast, AJCC 8th Edition - Clinical stage from 03/08/2018: Stage IB (cT2, cN1, cM0, G3, ER+, PR+, HER2+) - Signed by Malachy Mood, MD on 03/16/2018   03/08/2018 Receptors her2   Her2 positive, ER 90% PR 5% and Ki67 60%   03/08/2018 Pathology Results   Diagnosis 1. Breast, right, needle core biopsy, 2:30 - INVASIVE DUCTAL CARCINOMA, GRADE 3. - LYMPHOVASCULAR SPACE INVOLVEMENT BY TUMOR. 2. Lymph node, needle/core biopsy, right axilla - METASTATIC CARCINOMA INVOLVING SCANT LYMPHOID TISSUE.   03/10/2018 Initial Diagnosis   Malignant neoplasm of  upper-inner quadrant of right breast in female, estrogen receptor positive (HCC)   03/20/2018 Imaging   MRI brain 03/20/18 IMPRESSION: No evidence of metastatic disease.  Normal appearance of brain. Slightly heterogeneous marrow pattern of the upper cervical spine, nonspecific but likely benign, especially in the setting of early stage disease.    03/24/2018 Echocardiogram   Baseline ECHO 03/24/18 Study Conclusions - Left ventricle: The cavity size was normal. Wall thickness was   increased in a pattern of mild LVH. Systolic function was normal.   The estimated ejection fraction was in the range of 60% to 65%.   Wall motion was normal; there were no regional wall motion   abnormalities. Doppler parameters are consistent with abnormal   left ventricular relaxation (grade 1 diastolic dysfunction). - Right ventricle: The cavity size was mildly dilated. Wall   thickness was normal.   03/25/2018 Imaging   MRI Breast B/l 03/25/18 IMPRESSION: 1. The patient's primary malignancy in the posterior right breast measures 3.2 x 2 x 2.6 cm with apparent invasion of the underlying pectoralis muscle. Numerous surrounding abnormal enhancing masses, consistent with satellite lesions, extend from 11 o'clock in the right breast into the inferolateral right breast with a total span of suspected disease measuring 4.5 x 4.6 x 6.4 cm in AP, transverse, and craniocaudal dimension. The suspected extent of disease involves the superior and inferolateral quadrants. The primary malignancy also extends just across midline into the superior medial quadrant. 2. No MRI evidence of malignancy in the left breast. 3. Known metastatic noted in the right axilla.   03/25/2018 Imaging   Whole body bone scan 03/25/18 IMPRESSION: No scintigraphic evidence skeletal  metastasis.   03/25/2018 Imaging   CT CAP W Contrast 03/25/18 IMPRESSION: 1. Mass within the deep aspect of the right breast along the pectoralis muscle  compatible with primary breast malignancy. 2. There is suggestion of two additional possible masses within the lateral aspect of the right breast versus nodular appearing breast tissue. Recommend dedicated evaluation with bilateral breast MRI. 3. Mildly thickened right axillary lymph node compatible with metastatic adenopathy, recently biopsied. 4. Multiple bilateral pulmonary nodules are indeterminate, potentially sequelae of prior infectious/inflammatory process. Recommend attention on follow-up. Consider follow-up CT in 6 months. 5. Portal venous gas is demonstrated within the left hepatic lobe. Additionally, there is wall thickening of the cecum and ascending colon with small amount of gas within the pericolonic vasculature. Constellation of findings is indeterminate in etiology however may be secondary to colitis at this location with considerations including infectious, inflammatory or ischemic etiologies. Correlate for recent procedure. If patient is not up-to-date for colonic screening, recommend further evaluation with colonoscopy to exclude the possibility of colonic mass within the cecum/ascending colon. 6. These results were called by telephone at the time of interpretation on 03/25/2018 at 9:31 am to Dr. Truitt Merle , who verbally acknowledged these results.   03/30/2018 - 04/08/2019 Chemotherapy   Neoadjuvant TCHP every 3 weeks starting 03/30/18. Completed 6 cycles of TCHP on 07/23/18.  Continued with maintenance Herceptin/Perjeta q3weeks to complete 1 year.    04/09/2018 Genetic Testing   Negative genetic testing on the [facility] Common Hereditary Cancers Panel. The Common Hereditary Cancers Panel offered by [facility] includes sequencing and/or deletion duplication testing of the following 47 genes: APC, ATM, AXIN2, BARD1, BMPR1A, BRCA1, BRCA2, BRIP1, CDH1, CDKN2A (p14ARF), CDKN2A (p16INK4a), CKD4, CHEK2, CTNNA1, DICER1, EPCAM (Deletion/duplication testing only), GREM1 (promoter region  deletion/duplication testing only), KIT, MEN1, MLH1, MSH2, MSH3, MSH6, MUTYH, NBN, NF1, NHTL1, PALB2, PDGFRA, PMS2, POLD1, POLE, PTEN, RAD50, RAD51C, RAD51D, SDHB, SDHC, SDHD, SMAD4, SMARCA4. STK11, TP53, TSC1, TSC2, and VHL.  The following genes were evaluated for sequence changes only: SDHA and HOXB13 c.251G>A variant only.   Genetic testing did detect a Variant of Unknown Significance (VUS) in the POLD1 gene called c.985C>T (p.Pro329Ser). At this time, it is unknown if this variant is associated with increased cancer risk or if this is a normal finding, but most variants such as this get reclassified to being inconsequential. It should not be used to make medical management decisions.   The report date is 04/09/2018.   07/24/2018 Breast MRI   IMPRESSION: 1. Resolution of the enhancing mass (known cancer) and small satellite lesions previously seen in the right breast following chemotherapy.  2. The metastatic lymph node in the right axilla has decreased in size.  3.  No MRI evidence of left breast malignancy.   09/14/2018 Surgery   RIGHT NIPPLE SPARING MASTECTOMY WITH TARGETED DEEP RIGHT AXILLARY LYMPH NODE BIOPSY WITH RADIOACTIVE SEED LOCALIZATION AND RIGHT AXILLARY SENTINEL LYMPH NODE BIOPSY by Dr. Dalbert Batman  09/14/18    09/14/2018 Pathology Results   Diagnosis 1. Lymph node, sentinel, biopsy, right axillary with radioactive seed - ONE OF ONE LYMPH NODES NEGATIVE FOR CARCINOMA (0/1). - MILD TREATMENT EFFECT. - BIOPSY SITE. 2. Lymph node, sentinel, biopsy, right axillary - ONE OF ONE LYMPH NODES NEGATIVE FOR CARCINOMA (0/1). 3. Lymph node, sentinel, biopsy, right - ONE OF ONE LYMPH NODES NEGATIVE FOR CARCINOMA (0/1). 4. Lymph node, sentinel, biopsy, right - ONE OF ONE LYMPH NODES NEGATIVE FOR CARCINOMA (0/1). 5. Lymph node, sentinel, biopsy, right - ONE OF  ONE LYMPH NODES NEGATIVE FOR CARCINOMA (0/1). 6. Lymph node, sentinel, biopsy, right - ONE OF ONE LYMPH NODES NEGATIVE FOR  CARCINOMA (0/1). 7. Lymph node, sentinel, biopsy, right - ONE OF ONE LYMPH NODES NEGATIVE FOR CARCINOMA (0/1). 8. Lymph node, sentinel, biopsy, right - ONE OF ONE LYMPH NODES NEGATIVE FOR CARCINOMA (0/1). 9. Lymph node, sentinel, biopsy, right - ONE OF ONE LYMPH NODES NEGATIVE FOR CARCINOMA (0/1). 10. Lymph node, sentinel, biopsy, right - ONE OF ONE LYMPH NODES NEGATIVE FOR CARCINOMA (0/1). 11. Lymph node, sentinel, biopsy, right - ONE OF ONE LYMPH NODES NEGATIVE FOR CARCINOMA (0/1). 12. Lymph node, sentinel, biopsy, right - ONE OF ONE LYMPH NODES NEGATIVE FOR CARCINOMA (0/1). 13. Nipple Biopsy, right - BENIGN NIPPLE TISSUE. 14. Breast, simple mastectomy, right - NO RESIDUAL CARCINOMA IDENTIFIED. - TREATMENT EFFECT. - FIBROCYSTIC CHANGE. - ONE OF ONE LYMPH NODES NEGATIVE FOR CARCINOMA (0/1). - SEE ONCOLOGY TABLE.   09/14/2018 Cancer Staging   Staging form: Breast, AJCC 8th Edition - Pathologic stage from 09/14/2018: No Stage Recommended (ypT0, pN0, cM0, GX, ER: Not Assessed, PR: Not Assessed, HER2: Not Assessed) - Signed by Truitt Merle, MD on 09/23/2018   11/03/2018 - 12/10/2018 Radiation Therapy   Adjuvant Radiation with Dr. Isidore Moos    12/27/2018 -  Anti-estrogen oral therapy   Start Tamoxifen 20mg  daily in 12/27/2018 stopped after 1 week due to poor toleration. She started anastrozole on 02/04/19 but stopped on 02/14/19 due to worsening muscle aches. She tried Letrozole but did not tolerate. She was switched to Exemestane in late 04/2019.    01/04/2019 Imaging   CT chest  IMPRESSION: 1. Stable tiny subpleural and perifissural nodules bilaterally. No suspicious pulmonary nodule or mass. 2. No focal airspace disease in the right lung. 3.  Aortic Atherosclerois (ICD10-170.0)     03/08/2019 Imaging   CT AP W Contrast 03/08/19  IMPRESSION: 1. No metastatic disease in the abdomen pelvis. 2. No evidence adverse immunotherapy therapy reaction.   03/08/2019 Imaging   Whole body bone scan 03/08/19   IMPRESSION: No definite scintigraphic evidence of osseous metastases.   08/29/2019 -  Chemotherapy   Started Nerlynx with budesonide on 08/29/19      CURRENT THERAPY:  -Tamoxifen 20mg dailystarting6/1/2020stopped after 1 week due to poor toleration.She started anastrozole on 02/04/19 but stopped on 02/14/19 due to worsening muscle aches.She tried Letrozole but did not tolerate.She was switched to Exemestane in late10/2020. -StartedNerlynx240mg  dailywithbudesonideon 08/29/19. Reduced to 200mg  in late 08/2019  INTERVAL HISTORY:  Claire Guzman is here for a follow up. She presents to the clinic alone. She notes she is doing well. Her diarrhea is intermittent. She is on Nerlyx 5 tabs daily and budesonide. She feels her mood is fluctuating as she increased her Effexor dose to 150mg . She notes some days she will feel off. She notes recently having pain and irration of right upper face. Tylenol has helped. She notes today she mistakenly was heading to [facility] instead of [facility] for her appointment today. She denies any recurrent memory issues. She notes she has been forgetting her oral potassium.     REVIEW OF SYSTEMS:   Constitutional: Denies fevers, chills or abnormal weight loss (+) Right upper facial pain Eyes: Denies blurriness of vision Ears, nose, mouth, throat, and face: Denies mucositis or sore throat Respiratory: Denies cough, dyspnea or wheezes Cardiovascular: Denies palpitation, chest discomfort or lower extremity swelling Gastrointestinal:  Denies nausea, heartburn (+) Intermittent diarrhea  Skin: Denies abnormal skin rashes Lymphatics: Denies new lymphadenopathy or  easy bruising Neurological:Denies numbness, tingling or new weaknesses Behavioral/Psych: (+) Mood swings All other systems were reviewed with the patient and are negative.  MEDICAL HISTORY:  Past Medical History:  Diagnosis Date  . Abdominal pain, unspecified site   . Anxiety state, unspecified   .  Complication of anesthesia    convulsions - when she has appendectomy about 12 years ago at [facility]  . Contact dermatitis and other eczema, due to unspecified cause   . Essential hypertension, benign   . Family history of colon cancer   . Family history of melanoma   . Family history of prostate cancer   . GERD (gastroesophageal reflux disease)   . Hematuria, unspecified   . Hypertension   . Lower back pain   . Mixed hyperlipidemia   . Rosacea   . rt breast ca dx'd 01/2018   R breast cancer  . Unspecified symptom associated with female genital organs     SURGICAL HISTORY: Past Surgical History:  Procedure Laterality Date  . BREAST RECONSTRUCTION WITH PLACEMENT OF TISSUE EXPANDER AND ALLODERM Right 09/14/2018   Procedure: BREAST RECONSTRUCTION WITH PLACEMENT OF TISSUE EXPANDER AND ALLODERM;  Surgeon: Irene Limbo, MD;  Location: [place];  Service: Plastics;  Laterality: Right;  . CESAREAN SECTION    . DIAGNOSTIC LAPAROSCOPY    . EYE SURGERY     Lasik  . LAPAROSCOPIC APPENDECTOMY    . LIPOSUCTION WITH LIPOFILLING Right 07/26/2019   Procedure: LIPOFILLING from abdomen to R chest;  Surgeon: Irene Limbo, MD;  Location: [place];  Service: Plastics;  Laterality: Right;  . MASTECTOMY WITH RADIOACTIVE SEED GUIDED EXCISION AND AXILLARY SENTINEL LYMPH NODE BIOPSY Right 09/14/2018   Procedure: RIGHT NIPPLE SPARING MASTECTOMY WITH TARGETED DEEP RIGHT AXILLARY LYMPH NODE BIOPSY WITH RADIOACTIVE SEED LOCALIZATION AND RIGHT AXILLARY SENTINEL LYMPH NODE BIOPSY, FROZEN SECTION AND POSSIBLE COMPLETION AXILLARY LYMPH NODE DISSECTION INJECT BLUE DYE RIGHT BREAST;  Surgeon: Fanny Skates, MD;  Location: [place];  Service: General;  Laterality: Right;  . PORT-A-CATH REMOVAL Left 07/26/2019   Procedure: REMOVAL PORT-A-CATH;  Surgeon: Irene Limbo, MD;  Location: [place];  Service: Plastics;  Laterality: Left;  . PORTACATH PLACEMENT Left 04/13/2018    Procedure: INSERTION PORT-A-CATH WITH [place];  Surgeon: Fanny Skates, MD;  Location: [place];  Service: General;  Laterality: Left;  . REMOVAL OF TISSUE EXPANDER AND PLACEMENT OF IMPLANT Right 07/26/2019   Procedure: REMOVAL OF TISSUE EXPANDER AND PLACEMENT OF IMPLANT;  Surgeon: Irene Limbo, MD;  Location: [place];  Service: Plastics;  Laterality: Right;  . UTERINE FIBROID SURGERY      I have reviewed the social history and family history with the patient and they are unchanged from previous note.  ALLERGIES:  is allergic to metrogel [metronidazole], other, paxil [paroxetine hcl], prednisone, sulfamethoxazole, and zomig [zolmitriptan].  MEDICATIONS:  Current Outpatient Medications  Medication Sig Dispense Refill  . ALPRAZolam (XANAX) 0.5 MG tablet Take 1 tablet (0.5 mg total) by mouth at bedtime as needed for anxiety or sleep. 30 tablet 0  . atenolol (TENORMIN) 50 MG tablet Take 50 mg by mouth at bedtime.    . Budesonide ER (ORTIKOS) 9 MG CP24 Take 1 capsule by mouth daily. 30 capsule 2  . cephALEXin (KEFLEX) 500 MG capsule Take 1 capsule (500 mg total) by mouth 3 (three) times daily. 21 capsule 0  . cyclobenzaprine (FLEXERIL) 5 MG tablet Take 1 tablet (5 mg total) by mouth 3 (three) times  daily as needed for muscle spasms. 30 tablet 0  . exemestane (AROMASIN) 25 MG tablet Take 1 tablet (25 mg total) by mouth daily after breakfast. 90 tablet 1  . NERLYNX 40 MG tablet TAKE 6 TABLETS (240 MG TOTAL) BY MOUTH DAILY. 180 tablet 3  . omeprazole (PRILOSEC) 20 MG capsule Take 1 capsule (20 mg total) by mouth 2 (two) times daily before a meal. (Patient not taking: Reported on 01/03/2020) 60 capsule 2  . potassium chloride SA (KLOR-CON) 20 MEQ tablet TAKE 1 TABLET BY MOUTH EVERY DAY 30 tablet 3  . traMADol (ULTRAM) 50 MG tablet Take 1 tablet twice daily as needed for severe pain (Patient not taking: Reported on 01/03/2020) 30 tablet 0  . venlafaxine XR (EFFEXOR XR)  150 MG 24 hr capsule Take 1 capsule (150 mg total) by mouth daily with breakfast. 30 capsule 5   No current facility-administered medications for this visit.    PHYSICAL EXAMINATION: ECOG PERFORMANCE STATUS: 1 - Symptomatic but completely ambulatory  Vitals:   05/04/20 1103  BP: 132/86  Pulse: 71  Resp: 18  Temp: (!) 97.5 F (36.4 C)  SpO2: 100%   Filed Weights   05/04/20 1103  Weight: 160 lb 9.6 oz (72.8 kg)    Due to COVID19 we will limit examination to appearance. Patient had no complaints.  GENERAL:alert, no distress and comfortable SKIN: skin color normal, no rashes or significant lesions EYES: normal, Conjunctiva are pink and non-injected, sclera clear  NEURO: alert & oriented x 3 with fluent speech   LABORATORY DATA:  I have reviewed the data as listed CBC Latest Ref Rng & Units 05/04/2020 03/02/2020 12/30/2019  WBC 4.0 - 10.5 K/uL 6.0 6.1 5.0  Hemoglobin 12.0 - 15.0 g/dL 13.6 13.2 12.3  Hematocrit 36 - 46 % 37.8 36.9 34.5(L)  Platelets 150 - 400 K/uL 178 201 167     CMP Latest Ref Rng & Units 05/04/2020 03/02/2020 12/30/2019  Glucose 70 - 99 mg/dL 91 107(H) 96  BUN 6 - 20 mg/dL $Remove'11 10 12  'tCtGMXf$ Creatinine 0.44 - 1.00 mg/dL 0.77 0.73 0.77  Sodium 135 - 145 mmol/L 144 141 144  Potassium 3.5 - 5.1 mmol/L 3.1(L) 3.2(L) 3.6  Chloride 98 - 111 mmol/L 105 107 105  CO2 22 - 32 mmol/L 33(H) 23 27  Calcium 8.9 - 10.3 mg/dL 9.8 9.8 9.9  Total Protein 6.5 - 8.1 g/dL 7.6 7.7 7.2  Total Bilirubin 0.3 - 1.2 mg/dL 0.8 0.8 0.6  Alkaline Phos 38 - 126 U/L 96 124 122  AST 15 - 41 U/L 17 16 14(L)  ALT 0 - 44 U/L $Remo'11 11 11      'NMdEW$ RADIOGRAPHIC STUDIES: I have personally reviewed the radiological images as listed and agreed with the findings in the report. No results found.   ASSESSMENT & PLAN:  Claire Guzman is a 54 y.o. female with    1. Malignant neoplasm of upper-inner quadrant of right breast in female, invasive ductal carcinoma, cT2N1M0 stage IB, ER+/PR+/HER2+, G3,  ypT0N0 -She was diagnosed in 02/2018. She is s/pneoadjuvantchemoof 6 cyclesTCHP,right breast mastectomyandaxillary lymph node dissectionon 2/18/20and adjuvant radiation.She completed 1 year maintenance Herceptin/Perjeta in 03/2019. -She started antiestrogen therapy in 12/2018. She did not tolerate tamoxifen, Anastrozole or Letrozole. She is currently on Exemestane since 04/2019 tolerating much better.  -Sheunderwent reconstruction surgery and PAC removal byDr. Iran Planas on 07/26/19. -I started her on anti-HER2 treatment with oral Nerlynxfor one year on 08/29/19. Shewas initiallyable to titrate up to $Rem'240mg'hGiu$   Nerlynx daily.Due to intermittent diarrheaand worsening anxiety/OCDshe reduce Nerlynx to $RemoveBe'200mg'ooloaSlLj$  in late 08/2019.She is still on Budesonidedue to persistent diarrhea.  -From a breast cancer standpoint, she is clinically doing well. Lab reviewed, her CBC and CMP are within normal limits except mild hypokalemia. There is no clinical concern for recurrence. -Continue surveillance. Next Mammogram 04/2020. I discussed the option of additional screening with annual breast MRIs. I also discussed Abbreviated MRIs which have $400 out-of-pocket cost if insurance does not cover this. She is interested. Will start MRIs in 10/2020.  -Continue Exemestane and Nerlynx $RemoveBef'200mg'ftDDBXIOBd$ .  -F/u in 2 months  2.Anxiety, OCD, insomnia  -She notes she has been more anxious lately with increased OCD.  -I discussed Exemestane can lead to mood swings.  -She is on Xanax 0.$RemoveBe'5mg'iJpFXppNm$ , about4-5 night a week to help her sleep. She is onEffexor $RemoveBefo'150mg'EQdSMhffGwM$  once daily. -She continues to have mood swings occasionally.   4. Osteoporosis  -Her DEXA from 07/2019 showed lowest T-score -2.7 at left total femur.  -I discussed she is on Exemestane which can lower her bone density.  -Continue Calcium and Vit D once daily.  -To strengthen her bone and reduce risk of bone mets she started Zometa q43months for 2 years on 03/02/20.Next dose in  08/2020.   5. Diarrhea, Hypokalemia  -She has diarrhea secondary to Nerlynx.  -Now intermittent and mostly controlled on Imodium and budesonide and $RemoveBefore'200mg'LBhrMSPGkBeBY$  dose Nerlynx  -She has Hypokalemia secondary to her diarrhea. On oral potassium  -Mag normal and K 3.1 today (05/04/20). She is not compliant with K supplement. Will increase oral potassium to TID for the next 3 days then return to once daily.    Plan: -Flu shot today  -Continue Nerlynx, she will restart KCL supplement  -Continue Exemestane  -Lab and f/u in 2 months    No problem-specific Assessment & Plan notes found for this encounter.   No orders of the defined types were placed in this encounter.  All questions were answered. The patient knows to call the clinic with any problems, questions or concerns. No barriers to learning was detected. The total time spent in the appointment was 30 minutes.     Truitt Merle, MD 05/04/2020   I, Joslyn Devon, am acting as scribe for Truitt Merle, MD.   I have reviewed the above documentation for accuracy and completeness, and I agree with the above.

## 2020-05-04 ENCOUNTER — Other Ambulatory Visit: Payer: Self-pay

## 2020-05-04 ENCOUNTER — Encounter: Payer: Self-pay | Admitting: Hematology

## 2020-05-04 ENCOUNTER — Inpatient Hospital Stay: Payer: Medicaid Other | Attending: Hematology | Admitting: Hematology

## 2020-05-04 ENCOUNTER — Inpatient Hospital Stay: Payer: Medicaid Other

## 2020-05-04 VITALS — BP 132/86 | HR 71 | Temp 97.5°F | Resp 18 | Ht 64.0 in | Wt 160.6 lb

## 2020-05-04 DIAGNOSIS — Z170 Estrogen receptor positive status [ER+]: Secondary | ICD-10-CM | POA: Diagnosis not present

## 2020-05-04 DIAGNOSIS — C50211 Malignant neoplasm of upper-inner quadrant of right female breast: Secondary | ICD-10-CM

## 2020-05-04 DIAGNOSIS — Z23 Encounter for immunization: Secondary | ICD-10-CM

## 2020-05-04 LAB — CMP (CANCER CENTER ONLY)
ALT: 11 U/L (ref 0–44)
AST: 17 U/L (ref 15–41)
Albumin: 4.2 g/dL (ref 3.5–5.0)
Alkaline Phosphatase: 96 U/L (ref 38–126)
Anion gap: 6 (ref 5–15)
BUN: 11 mg/dL (ref 6–20)
CO2: 33 mmol/L — ABNORMAL HIGH (ref 22–32)
Calcium: 9.8 mg/dL (ref 8.9–10.3)
Chloride: 105 mmol/L (ref 98–111)
Creatinine: 0.77 mg/dL (ref 0.44–1.00)
GFR, Estimated: >60 mL/min (ref >60)
Glucose, Bld: 91 mg/dL (ref 70–99)
Potassium: 3.1 mmol/L — ABNORMAL LOW (ref 3.5–5.1)
Sodium: 144 mmol/L (ref 135–145)
Total Bilirubin: 0.8 mg/dL (ref 0.3–1.2)
Total Protein: 7.6 g/dL (ref 6.5–8.1)

## 2020-05-04 LAB — CBC WITH DIFFERENTIAL (CANCER CENTER ONLY)
Abs Immature Granulocytes: 0.03 10*3/uL (ref 0.00–0.07)
Basophils Absolute: 0 10*3/uL (ref 0.0–0.1)
Basophils Relative: 0 %
Eosinophils Absolute: 0 10*3/uL (ref 0.0–0.5)
Eosinophils Relative: 1 %
HCT: 37.8 % (ref 36.0–46.0)
Hemoglobin: 13.6 g/dL (ref 12.0–15.0)
Immature Granulocytes: 1 %
Lymphocytes Relative: 33 %
Lymphs Abs: 2 10*3/uL (ref 0.7–4.0)
MCH: 33.1 pg (ref 26.0–34.0)
MCHC: 36 g/dL (ref 30.0–36.0)
MCV: 92 fL (ref 80.0–100.0)
Monocytes Absolute: 0.3 10*3/uL (ref 0.1–1.0)
Monocytes Relative: 5 %
Neutro Abs: 3.7 10*3/uL (ref 1.7–7.7)
Neutrophils Relative %: 60 %
Platelet Count: 178 10*3/uL (ref 150–400)
RBC: 4.11 MIL/uL (ref 3.87–5.11)
RDW: 12 % (ref 11.5–15.5)
WBC Count: 6 10*3/uL (ref 4.0–10.5)
nRBC: 0 % (ref 0.0–0.2)

## 2020-05-04 LAB — MAGNESIUM: Magnesium: 1.7 mg/dL (ref 1.7–2.4)

## 2020-05-04 MED ORDER — ORTIKOS 9 MG PO CP24: 1.0000 | ORAL_CAPSULE | Freq: Every day | ORAL | 2 refills | Status: DC

## 2020-05-04 MED ORDER — INFLUENZA VAC SPLIT QUAD 0.5 ML IM SUSY
PREFILLED_SYRINGE | INTRAMUSCULAR | Status: AC
  Filled 2020-05-04: qty 0.5

## 2020-05-04 MED ORDER — INFLUENZA VAC SPLIT QUAD 0.5 ML IM SUSY
0.5000 mL | PREFILLED_SYRINGE | Freq: Once | INTRAMUSCULAR | Status: AC
  Administered 2020-05-04: 0.5 mL via INTRAMUSCULAR

## 2020-05-05 LAB — CANCER ANTIGEN 27.29: CA 27.29: 21.9 U/mL (ref 0.0–38.6)

## 2020-05-07 ENCOUNTER — Telehealth: Payer: Self-pay | Admitting: Hematology

## 2020-05-07 NOTE — Telephone Encounter (Signed)
Scheduled per 10/8 los. Pt is aware of app times and date.

## 2020-05-09 ENCOUNTER — Ambulatory Visit
Admission: RE | Admit: 2020-05-09 | Discharge: 2020-05-09 | Disposition: A | Discharge status: Home / Self Care | Payer: Medicaid Other | Source: Ambulatory Visit | Attending: Hematology | Admitting: Hematology

## 2020-05-09 ENCOUNTER — Other Ambulatory Visit: Payer: Self-pay

## 2020-05-09 DIAGNOSIS — Z1231 Encounter for screening mammogram for malignant neoplasm of breast: Secondary | ICD-10-CM

## 2020-05-12 IMAGING — NM NUCLEAR MEDICINE WHOLE BODY BONE SCINTIGRAPHY
2 series · 2 of 2 positions shown · non-contrast
Comparison: Bone scan of March 24, 2018.

CLINICAL DATA: Malignant neoplasm of upper-inner quadrant of right
breast in female, estrogen receptor positive.

EXAM:
NUCLEAR MEDICINE WHOLE BODY BONE SCAN
TECHNIQUE: Whole body anterior and posterior images were obtained approximately
3 hours after intravenous injection of radiopharmaceutical.
RADIOPHARMACEUTICALS:  20.0 mCi 4echnetium-YYm MDP IV

[Series 1: wbr_bone_40 whole body · 2.66mm/px · 1 of 1 slices shown (1 of 2)]
[im 1/1]
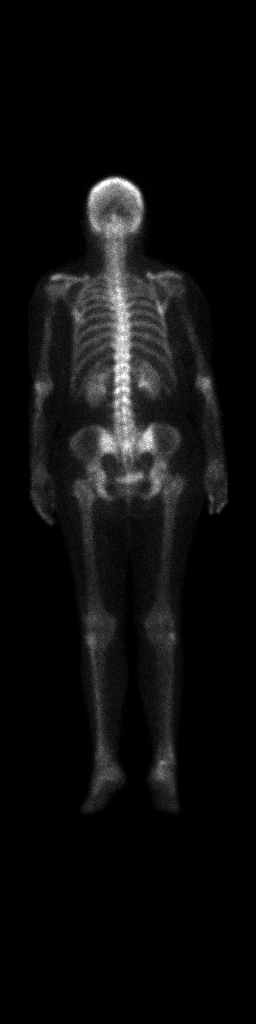

[Series 1: wbr_bone_40 whole body · 2.66mm/px · 1 of 1 slices shown (2 of 2)]
[im 1/1]
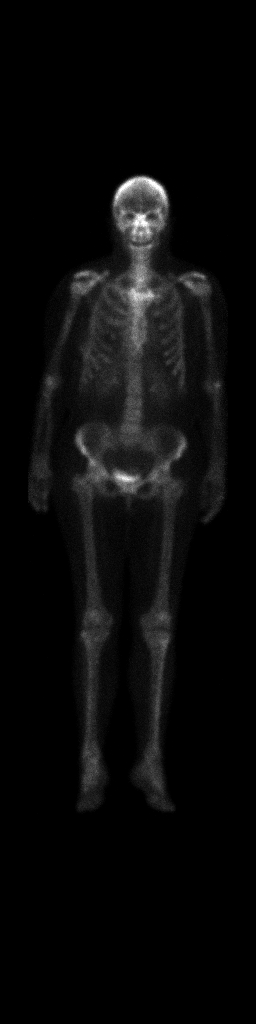

[2 of 2 positions shown; findings below may reference images not displayed]

FINDINGS: No abnormal uptake is noted within the skeleton.
IMPRESSION: No definite scintigraphic evidence of osseous metastases.

## 2020-05-12 IMAGING — CT CT ABDOMEN AND PELVIS WITH CONTRAST
2 of 5 series · 16 of 46 positions shown, 18 images · IV contrast (APPLIED)
Comparison: None.

CLINICAL DATA: RT Breast CA dx'd January 2018 Ongoing immunotherapy
100 ml omni 300^100mL OMNIPAQUE IOHEXOL 300 MG/ML SOLN, 30mL
OMNIPAQUE IOHEXOL 300 MG/ML SOLNBack pain, malignancy suspected

EXAM:
CT ABDOMEN AND PELVIS WITH CONTRAST
TECHNIQUE: Multidetector CT imaging of the abdomen and pelvis was performed
using the standard protocol following bolus administration of
intravenous contrast.
CONTRAST:  100mL OMNIPAQUE IOHEXOL 300 MG/ML SOLN, 30mL OMNIPAQUE
IOHEXOL 300 MG/ML SOLN

[Series 2: axial st · axial · 0.82mm/px · z∈[+877,+1277]mm · 13 of 92 slices shown, 15 images]
[im 6/92  soft-tissue]
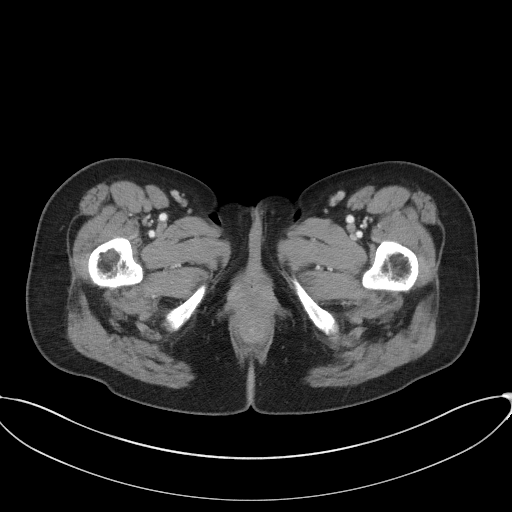
[im 6/92  bone]
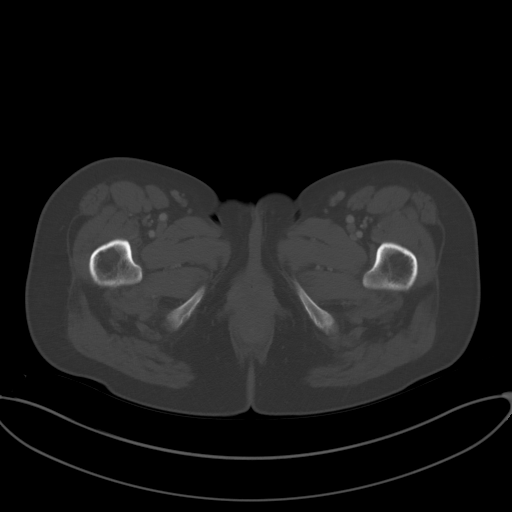
[im 12/92  soft-tissue]
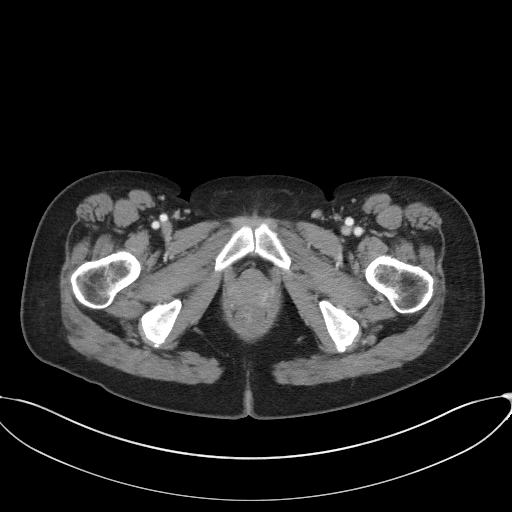
[im 18/92  soft-tissue]
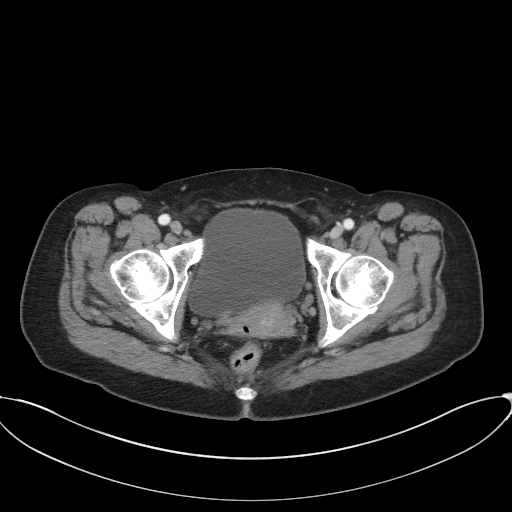
[im 29/92  soft-tissue]
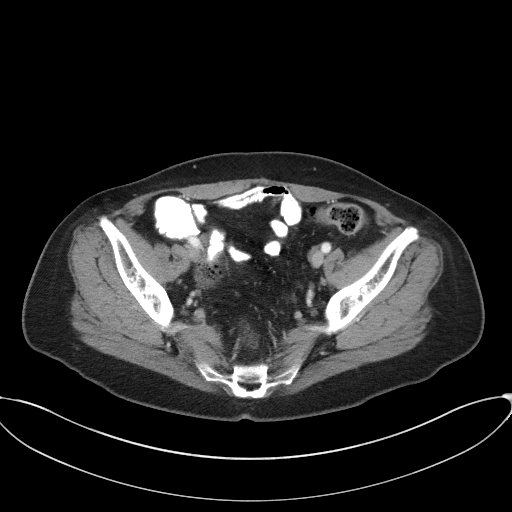
[im 35/92  soft-tissue]
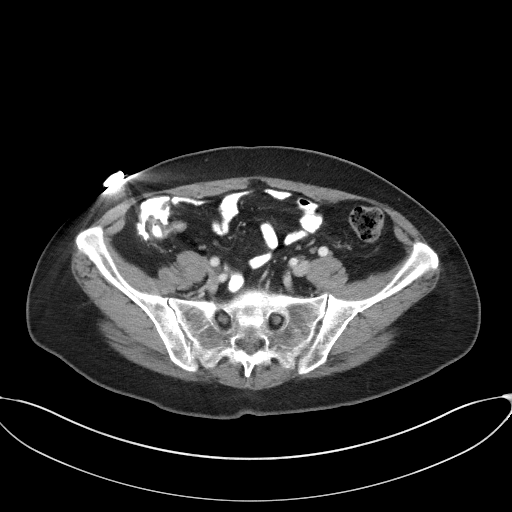
[im 40/92  soft-tissue]
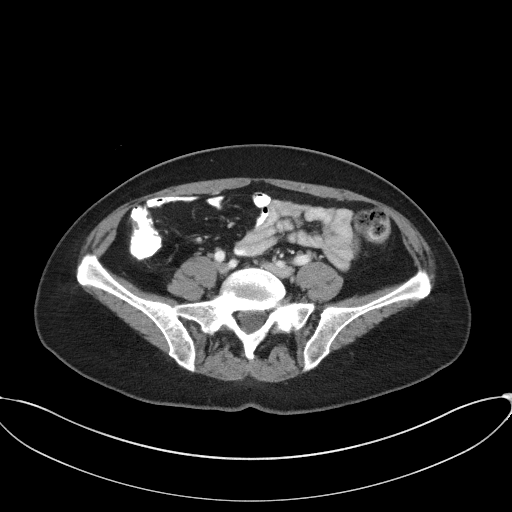
[im 46/92  soft-tissue]
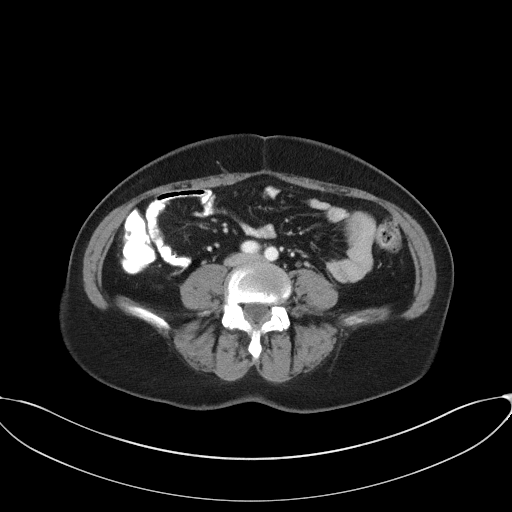
[im 52/92  soft-tissue]
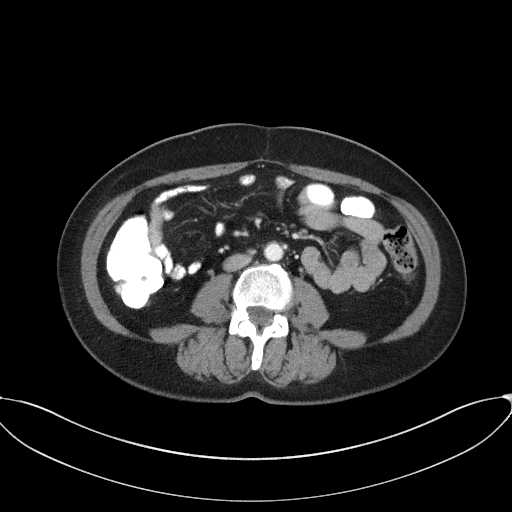
[im 57/92  soft-tissue]
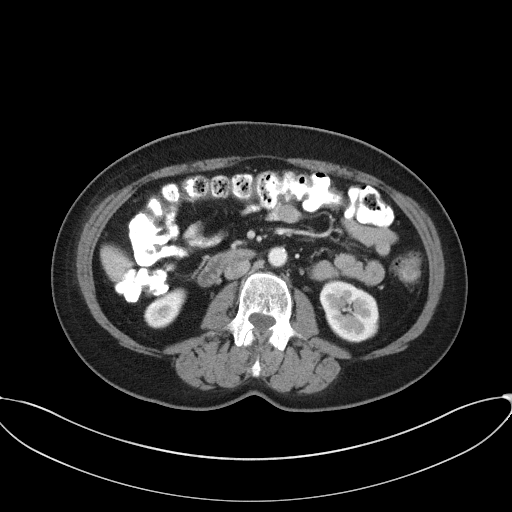
[im 57/92  bone]
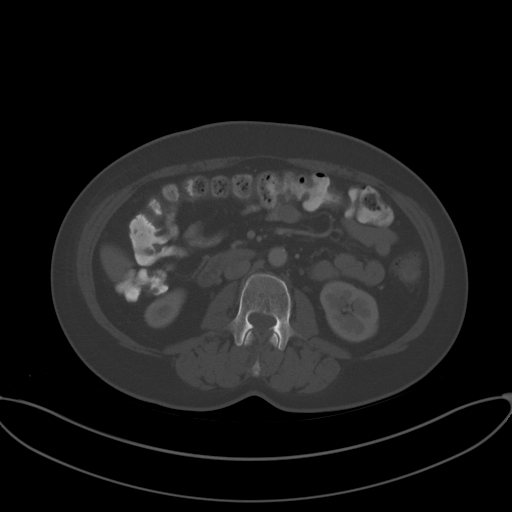
[im 63/92  soft-tissue]
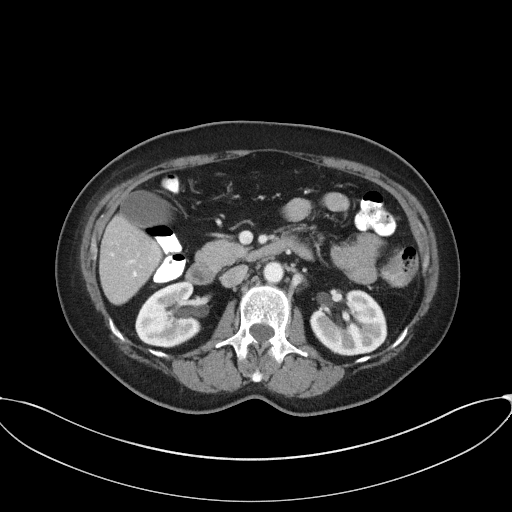
[im 74/92  soft-tissue]
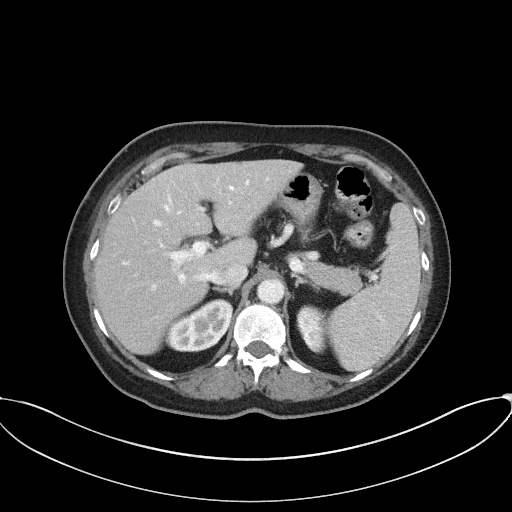
[im 80/92  soft-tissue]
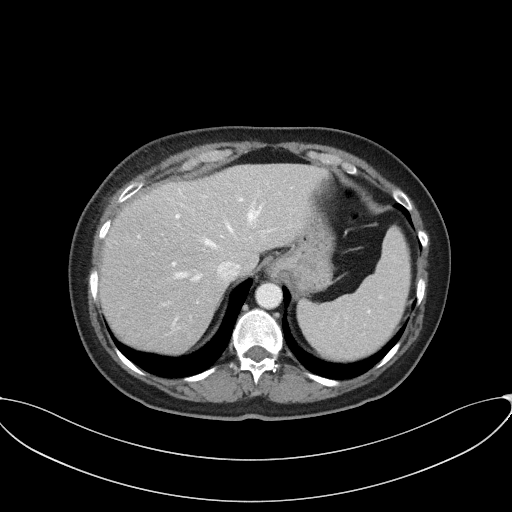
[im 86/92  soft-tissue]
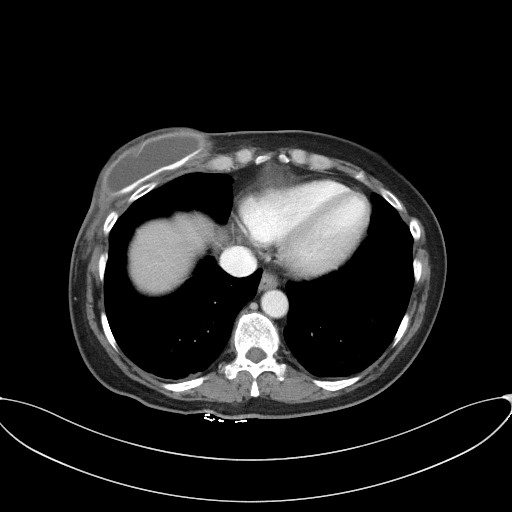

[Series 4: coronal st · coronal · 0.68mm/px · 3 of 87 slices shown]
[im 29/87  soft-tissue]
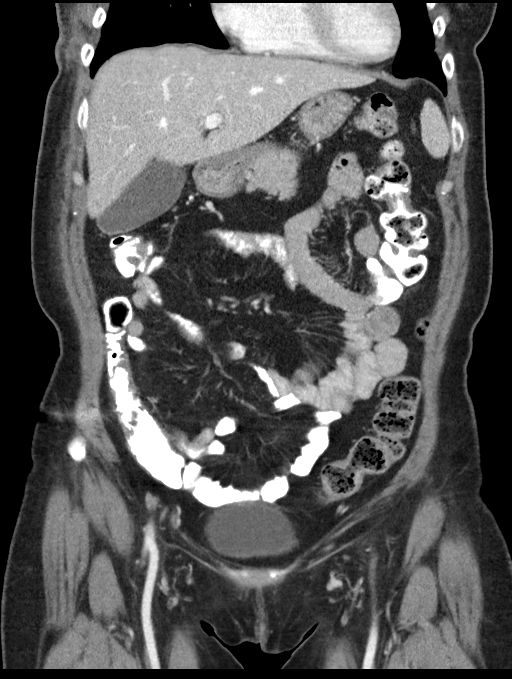
[im 39/87  soft-tissue]
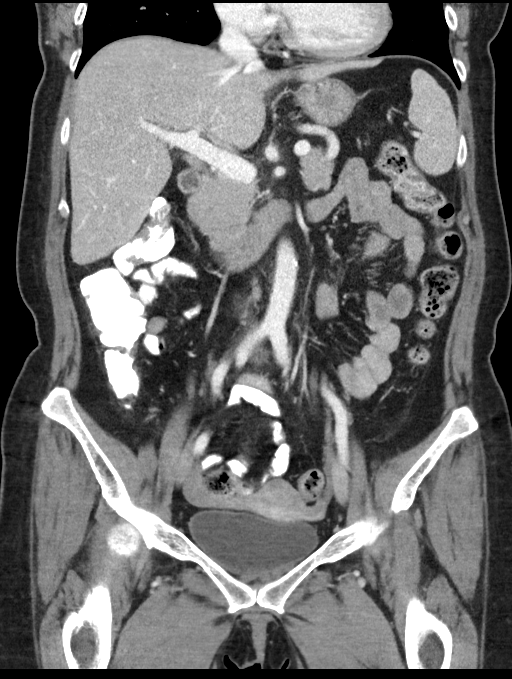
[im 48/87  soft-tissue]
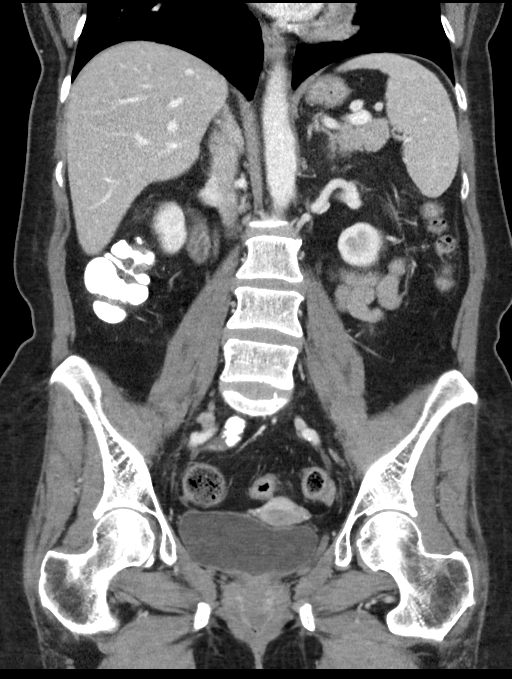

[16 of 46 positions shown; findings below may reference images not displayed]

FINDINGS: Lower chest: Lung bases are clear.

Hepatobiliary: No focal hepatic lesion. No biliary duct dilatation.
Gallbladder is normal. Common bile duct is normal.

Pancreas: Pancreas is normal. No ductal dilatation. No pancreatic
inflammation.

Spleen: Normal spleen

Adrenals/urinary tract:

Stomach/Bowel: Stomach, small bowel, and cecum are normal. The colon
and rectosigmoid colon are normal.

Vascular/Lymphatic: Abdominal aorta is normal caliber. No periportal
or retroperitoneal adenopathy. No pelvic adenopathy.

Reproductive: Uterus and ovaries normal.

Other: No free fluid.

Musculoskeletal: No aggressive osseous lesion.
IMPRESSION: 1. No metastatic disease in the abdomen pelvis.
2. No evidence adverse immunotherapy therapy reaction.

## 2020-05-15 ENCOUNTER — Telehealth: Payer: Self-pay

## 2020-05-15 ENCOUNTER — Telehealth: Payer: Self-pay | Admitting: Nurse Practitioner

## 2020-05-15 ENCOUNTER — Ambulatory Visit (HOSPITAL_COMMUNITY)
Admission: RE | Admit: 2020-05-15 | Discharge: 2020-05-15 | Disposition: A | Discharge status: Home / Self Care | Payer: Medicaid Other | Source: Ambulatory Visit | Attending: Pulmonary Disease | Admitting: Pulmonary Disease

## 2020-05-15 ENCOUNTER — Other Ambulatory Visit: Payer: Self-pay | Admitting: Nurse Practitioner

## 2020-05-15 DIAGNOSIS — U071 COVID-19: Secondary | ICD-10-CM

## 2020-05-15 DIAGNOSIS — C50211 Malignant neoplasm of upper-inner quadrant of right female breast: Secondary | ICD-10-CM

## 2020-05-15 DIAGNOSIS — Z170 Estrogen receptor positive status [ER+]: Secondary | ICD-10-CM | POA: Diagnosis present

## 2020-05-15 MED ORDER — ALBUTEROL SULFATE HFA 108 (90 BASE) MCG/ACT IN AERS: 2.0000 | INHALATION_SPRAY | Freq: Once | RESPIRATORY_TRACT | Status: DC | PRN

## 2020-05-15 MED ORDER — METHYLPREDNISOLONE SODIUM SUCC 125 MG IJ SOLR: 125.0000 mg | Freq: Once | INTRAMUSCULAR | Status: DC | PRN

## 2020-05-15 MED ORDER — SODIUM CHLORIDE 0.9 % IV SOLN: Freq: Once | INTRAVENOUS | Status: AC

## 2020-05-15 MED ORDER — DIPHENHYDRAMINE HCL 50 MG/ML IJ SOLN
50.0000 mg | Freq: Once | INTRAMUSCULAR | Status: AC | PRN
  Administered 2020-05-15: 50 mg via INTRAVENOUS
  Filled 2020-05-15: qty 1

## 2020-05-15 MED ORDER — FAMOTIDINE IN NACL 20-0.9 MG/50ML-% IV SOLN
20.0000 mg | Freq: Once | INTRAVENOUS | Status: AC | PRN
  Administered 2020-05-15: 20 mg via INTRAVENOUS
  Filled 2020-05-15: qty 50

## 2020-05-15 MED ORDER — SODIUM CHLORIDE 0.9 % IV SOLN: INTRAVENOUS | Status: DC | PRN

## 2020-05-15 MED ORDER — EPINEPHRINE 0.3 MG/0.3ML IJ SOAJ: 0.3000 mg | Freq: Once | INTRAMUSCULAR | Status: DC | PRN

## 2020-05-15 NOTE — Progress Notes (Signed)
I connected by phone with Claire Guzman on 05/15/2020 at 1:34 PM to discuss the potential use of a new treatment for mild to moderate COVID-19 viral infection in non-hospitalized patients.  This patient is a 54 y.o. female that meets the FDA criteria for Emergency Use Authorization of COVID monoclonal antibody casirivimab/imdevimab or bamlanivimab/eteseviamb.  Has a (+) direct SARS-CoV-2 viral test result  Has mild or moderate COVID-19   Is NOT hospitalized due to COVID-19  Is within 10 days of symptom onset  Has at least one of the high risk factor(s) for progression to severe COVID-19 and/or hospitalization as defined in EUA.  Specific high risk criteria : Immunosuppressive Disease or Treatment   I have spoken and communicated the following to the patient or parent/caregiver regarding COVID monoclonal antibody treatment:  1. FDA has authorized the emergency use for the treatment of mild to moderate COVID-19 in adults and pediatric patients with positive results of direct SARS-CoV-2 viral testing who are 57 years of age and older weighing at least 40 kg, and who are at high risk for progressing to severe COVID-19 and/or hospitalization.  2. The significant known and potential risks and benefits of COVID monoclonal antibody, and the extent to which such potential risks and benefits are unknown.  3. Information on available alternative treatments and the risks and benefits of those alternatives, including clinical trials.  4. Patients treated with COVID monoclonal antibody should continue to self-isolate and use infection control measures (e.g., wear mask, isolate, social distance, avoid sharing personal items, clean and disinfect "high touch" surfaces, and frequent handwashing) according to CDC guidelines.   5. The patient or parent/caregiver has the option to accept or refuse COVID monoclonal antibody treatment.  After reviewing this information with the patient, the patient has  agreed to receive one of the available covid 19 monoclonal antibodies and will be provided an appropriate fact sheet prior to infusion. Fenton Foy, NP 05/15/2020 1:34 PM

## 2020-05-15 NOTE — Progress Notes (Signed)
  Diagnosis: COVID-19  Physician:Dr Joya Gaskins  Procedure: [facility]fusion [facility] Med: bamlanivimab\etesevimab infusion - Provided patient with bamlanimivab\etesevimab fact sheet for patients, parents and caregivers prior to infusion.  Complications: No immediate complications noted.  Discharge: Discharged home   [place], [place] 05/15/2020

## 2020-05-15 NOTE — Telephone Encounter (Signed)
Called to Discuss with patient about Covid symptoms and the use of the monoclonal antibody infusion for those with mild to moderate Covid symptoms and at a high risk of hospitalization.     Pt appears to qualify for this infusion due to co-morbid conditions and/or a member of an at-risk group in accordance with the FDA Emergency Use Authorization.    Unable to reach pt - husband states he will have patient return call this afternoon

## 2020-05-15 NOTE — Telephone Encounter (Signed)
Claire Guzman called stating she has been diagnosed with covid at [facility] medical this past weekend. She states they have not arranged immunotherapy infusion.  I requested she call and get results faxed to [place] so we can arrange treatment.

## 2020-05-15 NOTE — Progress Notes (Signed)
Bamlanimivimab restarted per NP order . Patient has no complaints at this time.VSS.

## 2020-05-15 NOTE — Progress Notes (Signed)
Bamlanivimav infusion stopped. Patient c/0 back pain. Vitals rechecked. No chest pain or Sob. Np in to evaluate patient. Ordered to give patient benadryl and pepcid iv.

## 2020-05-15 NOTE — Discharge Instructions (Signed)

## 2020-05-16 ENCOUNTER — Other Ambulatory Visit (HOSPITAL_COMMUNITY): Payer: Self-pay

## 2020-05-21 ENCOUNTER — Other Ambulatory Visit: Payer: Self-pay | Admitting: Hematology

## 2020-05-21 DIAGNOSIS — F419 Anxiety disorder, unspecified: Secondary | ICD-10-CM

## 2020-05-24 ENCOUNTER — Other Ambulatory Visit: Payer: Self-pay

## 2020-05-24 ENCOUNTER — Other Ambulatory Visit: Payer: Self-pay | Admitting: Hematology

## 2020-05-24 DIAGNOSIS — F419 Anxiety disorder, unspecified: Secondary | ICD-10-CM

## 2020-05-24 MED ORDER — ALPRAZOLAM 0.5 MG PO TABS: 0.5000 mg | ORAL_TABLET | Freq: Every evening | ORAL | 0 refills | Status: DC | PRN

## 2020-05-24 NOTE — Progress Notes (Signed)
error 

## 2020-05-24 NOTE — Telephone Encounter (Signed)
Refill request for your review 

## 2020-06-13 ENCOUNTER — Ambulatory Visit (INDEPENDENT_AMBULATORY_CARE_PROVIDER_SITE_OTHER): Payer: Medicaid Other | Admitting: Podiatry

## 2020-06-13 ENCOUNTER — Encounter: Payer: Self-pay | Admitting: Podiatry

## 2020-06-13 ENCOUNTER — Ambulatory Visit (INDEPENDENT_AMBULATORY_CARE_PROVIDER_SITE_OTHER): Payer: Medicaid Other

## 2020-06-13 ENCOUNTER — Other Ambulatory Visit: Payer: Self-pay

## 2020-06-13 VITALS — BP 131/82 | HR 71 | Temp 98.4°F

## 2020-06-13 DIAGNOSIS — M722 Plantar fascial fibromatosis: Secondary | ICD-10-CM

## 2020-06-13 DIAGNOSIS — M778 Other enthesopathies, not elsewhere classified: Secondary | ICD-10-CM | POA: Diagnosis not present

## 2020-06-13 DIAGNOSIS — D169 Benign neoplasm of bone and articular cartilage, unspecified: Secondary | ICD-10-CM

## 2020-06-13 MED ORDER — DICLOFENAC SODIUM 75 MG PO TBEC: 75.0000 mg | DELAYED_RELEASE_TABLET | Freq: Two times a day (BID) | ORAL | 2 refills | Status: DC

## 2020-06-13 NOTE — Progress Notes (Signed)
Subjective:   Patient ID: Claire Guzman, female   DOB: 54 y.o.   MRN: [id number]   HPI Patient states she is getting a lot of pain in her right heel and the outside of her left foot.  States is been going on and gotten worse over the last couple months and she does not remember specific injury.  States that she tries to be active and she does not smoke   Review of Systems  All other systems reviewed and are negative.       Objective:  Physical Exam Vitals and nursing note reviewed.  Constitutional:      Appearance: She is well-developed.  Pulmonary:     Effort: Pulmonary effort is normal.  Musculoskeletal:        General: Normal range of motion.  Skin:    General: Skin is warm.  Neurological:     Mental Status: She is alert.     Neurovascular status intact muscle strength found to be within normal limits range of motion adequate.  Patient is found to have exquisite discomfort plantar aspect right heel the insertion of the tendon calcaneus and extreme discomfort on the outside of the left foot around the peroneal tendon and into the extensor complex.  Patient is found to have good digital perfusion is well oriented x3     Assessment:  Acute plantar fasciitis right with extensor peroneal tendinitis left which may be compensatory in nature H     Plan:  NP reviewed both conditions and for the right I did sterile prep injected the tendon fascial complex at insertion 3 mg Kenalog 5 mg Xylocaine and for the left I went ahead and injected the extensor peroneal complex 3 mg Dexasone Kenalog 5 mg Xylocaine.  Applied fascial brace right and placed on diclofenac 75 mg twice daily and reappoint the next few weeks.  X-rays indicate that there is spur formation plantar heel no indication stress fracture arthritis

## 2020-06-13 NOTE — Patient Instructions (Signed)

## 2020-06-20 ENCOUNTER — Telehealth: Payer: Self-pay | Admitting: Podiatry

## 2020-06-20 NOTE — Telephone Encounter (Signed)
Patient called in stating she attempted to pick up prescription but was told [facility] was the hold up. I contacted patients pharmacy and they stated she needed prior authorization in order to receive prescription, Please advise

## 2020-06-21 ENCOUNTER — Other Ambulatory Visit: Payer: Self-pay | Admitting: Podiatry

## 2020-06-21 MED ORDER — MELOXICAM 15 MG PO TABS: 15.0000 mg | ORAL_TABLET | Freq: Every day | ORAL | 2 refills | Status: DC

## 2020-06-27 ENCOUNTER — Other Ambulatory Visit: Payer: Self-pay

## 2020-06-27 ENCOUNTER — Ambulatory Visit (INDEPENDENT_AMBULATORY_CARE_PROVIDER_SITE_OTHER): Payer: Medicaid Other | Admitting: Podiatry

## 2020-06-27 ENCOUNTER — Other Ambulatory Visit: Payer: Self-pay | Admitting: Hematology

## 2020-06-27 DIAGNOSIS — F419 Anxiety disorder, unspecified: Secondary | ICD-10-CM

## 2020-06-27 DIAGNOSIS — M722 Plantar fascial fibromatosis: Secondary | ICD-10-CM

## 2020-06-27 MED ORDER — TRIAMCINOLONE ACETONIDE 10 MG/ML IJ SUSP
10.0000 mg | Freq: Once | INTRAMUSCULAR | Status: AC
  Administered 2020-06-27: 10 mg

## 2020-06-27 NOTE — Progress Notes (Signed)
Subjective:   Patient ID: Claire Guzman, female   DOB: 54 y.o.   MRN: [id number]   HPI Patient states she has been doing some better but her right heel has been tender still   ROS      Objective:  Physical Exam  Neurovascular status intact with continued discomfort right plantar fascia     Assessment:  Acute plantar fasciitis right still present improved     Plan:  Sterile prep injected the fascia 3 mg Kenalog 5 mg Xylocaine advised on reduced activity immobilization and reappoint as needed signed this

## 2020-06-29 ENCOUNTER — Other Ambulatory Visit: Payer: Self-pay | Admitting: Hematology

## 2020-06-29 DIAGNOSIS — F419 Anxiety disorder, unspecified: Secondary | ICD-10-CM

## 2020-06-29 MED ORDER — ALPRAZOLAM 0.5 MG PO TABS: 0.5000 mg | ORAL_TABLET | Freq: Every evening | ORAL | 0 refills | Status: DC | PRN

## 2020-06-29 NOTE — Progress Notes (Signed)
[place]   Telephone:([phone]) [phone] Fax:(336) [phone]   Clinic Follow up Note   Patient Care Team: Lujean Amel, MD as PCP - General (Family Medicine) Fanny Skates, MD as Consulting Physician (General Surgery) Truitt Merle, MD as Consulting Physician (Hematology) Eppie Gibson, MD as Attending Physician (Radiation Oncology) Mauro Kaufmann, RN as Oncology Nurse Navigator Rockwell Germany, RN as Oncology Nurse Navigator  Date of Service:  07/04/2020  CHIEF COMPLAINT: F/u for right breast cancer  SUMMARY OF ONCOLOGIC HISTORY: Oncology History Overview Note  Cancer Staging Malignant neoplasm of upper-inner quadrant of right breast in female, estrogen receptor positive ([place]) Staging form: Breast, AJCC 8th Edition - Clinical stage from 03/08/2018: Stage IB (cT2, cN1, cM0, G3, ER+, PR+, HER2+) - Signed by Truitt Merle, MD on 03/16/2018     Malignant neoplasm of upper-inner quadrant of right breast in female, estrogen receptor positive ([place])  02/24/2018 Mammogram   screening mammogram on 02/24/2018 that was benign   03/04/2018 Mammogram   diagnostic mammogram on 03/04/2018 due to a palpable mass on her right breast. Diagnostic mammogram, breast US revealed and biopsy revealed suspicious mass in the right breast at 2:30 at the palpable site of concern and one suspicious right axillary LN.   03/08/2018 Cancer Staging   Staging form: Breast, AJCC 8th Edition - Clinical stage from 03/08/2018: Stage IB (cT2, cN1, cM0, G3, ER+, PR+, HER2+) - Signed by Truitt Merle, MD on 03/16/2018   03/08/2018 Receptors her2   Her2 positive, ER 90% PR 5% and Ki67 60%   03/08/2018 Pathology Results   Diagnosis 1. Breast, right, needle core biopsy, 2:30 - INVASIVE DUCTAL CARCINOMA, GRADE 3. - LYMPHOVASCULAR SPACE INVOLVEMENT BY TUMOR. 2. Lymph node, needle/core biopsy, right axilla - METASTATIC CARCINOMA INVOLVING SCANT LYMPHOID TISSUE.   03/10/2018 Initial Diagnosis   Malignant neoplasm of  upper-inner quadrant of right breast in female, estrogen receptor positive ([place])   03/20/2018 Imaging   MRI brain 03/20/18 IMPRESSION: No evidence of metastatic disease.  Normal appearance of brain. Slightly heterogeneous marrow pattern of the upper cervical spine, nonspecific but likely benign, especially in the setting of early stage disease.    03/24/2018 Echocardiogram   Baseline ECHO 03/24/18 Study Conclusions - Left ventricle: The cavity size was normal. Wall thickness was   increased in a pattern of mild LVH. Systolic function was normal.   The estimated ejection fraction was in the range of 60% to 65%.   Wall motion was normal; there were no regional wall motion   abnormalities. Doppler parameters are consistent with abnormal   left ventricular relaxation (grade 1 diastolic dysfunction). - Right ventricle: The cavity size was mildly dilated. Wall   thickness was normal.   03/25/2018 Imaging   MRI Breast B/l 03/25/18 IMPRESSION: 1. The patient's primary malignancy in the posterior right breast measures 3.2 x 2 x 2.6 cm with apparent invasion of the underlying pectoralis muscle. Numerous surrounding abnormal enhancing masses, consistent with satellite lesions, extend from 11 o'clock in the right breast into the inferolateral right breast with a total span of suspected disease measuring 4.5 x 4.6 x 6.4 cm in AP, transverse, and craniocaudal dimension. The suspected extent of disease involves the superior and inferolateral quadrants. The primary malignancy also extends just across midline into the superior medial quadrant. 2. No MRI evidence of malignancy in the left breast. 3. Known metastatic noted in the right axilla.   03/25/2018 Imaging   Whole body bone scan 03/25/18 IMPRESSION: No scintigraphic evidence skeletal  metastasis.   03/25/2018 Imaging   CT CAP W Contrast 03/25/18 IMPRESSION: 1. Mass within the deep aspect of the right breast along the pectoralis muscle  compatible with primary breast malignancy. 2. There is suggestion of two additional possible masses within the lateral aspect of the right breast versus nodular appearing breast tissue. Recommend dedicated evaluation with bilateral breast MRI. 3. Mildly thickened right axillary lymph node compatible with metastatic adenopathy, recently biopsied. 4. Multiple bilateral pulmonary nodules are indeterminate, potentially sequelae of prior infectious/inflammatory process. Recommend attention on follow-up. Consider follow-up CT in 6 months. 5. Portal venous gas is demonstrated within the left hepatic lobe. Additionally, there is wall thickening of the cecum and ascending colon with small amount of gas within the pericolonic vasculature. Constellation of findings is indeterminate in etiology however may be secondary to colitis at this location with considerations including infectious, inflammatory or ischemic etiologies. Correlate for recent procedure. If patient is not up-to-date for colonic screening, recommend further evaluation with colonoscopy to exclude the possibility of colonic mass within the cecum/ascending colon. 6. These results were called by telephone at the time of interpretation on 03/25/2018 at 9:31 am to Dr. Truitt Merle , who verbally acknowledged these results.   03/30/2018 - 04/08/2019 Chemotherapy   Neoadjuvant TCHP every 3 weeks starting 03/30/18. Completed 6 cycles of TCHP on 07/23/18.  Continued with maintenance Herceptin/Perjeta q3weeks to complete 1 year.    04/09/2018 Genetic Testing   Negative genetic testing on the [employer] Common Hereditary Cancers Panel. The Common Hereditary Cancers Panel offered by [facility] includes sequencing and/or deletion duplication testing of the following 47 genes: APC, ATM, AXIN2, BARD1, BMPR1A, BRCA1, BRCA2, BRIP1, CDH1, CDKN2A (p14ARF), CDKN2A (p16INK4a), CKD4, CHEK2, CTNNA1, DICER1, EPCAM (Deletion/duplication testing only), GREM1 (promoter region  deletion/duplication testing only), KIT, MEN1, MLH1, MSH2, MSH3, MSH6, MUTYH, NBN, NF1, NHTL1, PALB2, PDGFRA, PMS2, POLD1, POLE, PTEN, RAD50, RAD51C, RAD51D, SDHB, SDHC, SDHD, SMAD4, SMARCA4. STK11, TP53, TSC1, TSC2, and VHL.  The following genes were evaluated for sequence changes only: SDHA and HOXB13 c.251G>A variant only.   Genetic testing did detect a Variant of Unknown Significance (VUS) in the POLD1 gene called c.985C>T (p.Pro329Ser). At this time, it is unknown if this variant is associated with increased cancer risk or if this is a normal finding, but most variants such as this get reclassified to being inconsequential. It should not be used to make medical management decisions.   The report date is 04/09/2018.   07/24/2018 Breast MRI   IMPRESSION: 1. Resolution of the enhancing mass (known cancer) and small satellite lesions previously seen in the right breast following chemotherapy.  2. The metastatic lymph node in the right axilla has decreased in size.  3.  No MRI evidence of left breast malignancy.   09/14/2018 Surgery   RIGHT NIPPLE SPARING MASTECTOMY WITH TARGETED DEEP RIGHT AXILLARY LYMPH NODE BIOPSY WITH RADIOACTIVE SEED LOCALIZATION AND RIGHT AXILLARY SENTINEL LYMPH NODE BIOPSY by Dr. Dalbert Batman  09/14/18    09/14/2018 Pathology Results   Diagnosis 1. Lymph node, sentinel, biopsy, right axillary with radioactive seed - ONE OF ONE LYMPH NODES NEGATIVE FOR CARCINOMA (0/1). - MILD TREATMENT EFFECT. - BIOPSY SITE. 2. Lymph node, sentinel, biopsy, right axillary - ONE OF ONE LYMPH NODES NEGATIVE FOR CARCINOMA (0/1). 3. Lymph node, sentinel, biopsy, right - ONE OF ONE LYMPH NODES NEGATIVE FOR CARCINOMA (0/1). 4. Lymph node, sentinel, biopsy, right - ONE OF ONE LYMPH NODES NEGATIVE FOR CARCINOMA (0/1). 5. Lymph node, sentinel, biopsy, right - ONE OF  ONE LYMPH NODES NEGATIVE FOR CARCINOMA (0/1). 6. Lymph node, sentinel, biopsy, right - ONE OF ONE LYMPH NODES NEGATIVE FOR  CARCINOMA (0/1). 7. Lymph node, sentinel, biopsy, right - ONE OF ONE LYMPH NODES NEGATIVE FOR CARCINOMA (0/1). 8. Lymph node, sentinel, biopsy, right - ONE OF ONE LYMPH NODES NEGATIVE FOR CARCINOMA (0/1). 9. Lymph node, sentinel, biopsy, right - ONE OF ONE LYMPH NODES NEGATIVE FOR CARCINOMA (0/1). 10. Lymph node, sentinel, biopsy, right - ONE OF ONE LYMPH NODES NEGATIVE FOR CARCINOMA (0/1). 11. Lymph node, sentinel, biopsy, right - ONE OF ONE LYMPH NODES NEGATIVE FOR CARCINOMA (0/1). 12. Lymph node, sentinel, biopsy, right - ONE OF ONE LYMPH NODES NEGATIVE FOR CARCINOMA (0/1). 13. Nipple Biopsy, right - BENIGN NIPPLE TISSUE. 14. Breast, simple mastectomy, right - NO RESIDUAL CARCINOMA IDENTIFIED. - TREATMENT EFFECT. - FIBROCYSTIC CHANGE. - ONE OF ONE LYMPH NODES NEGATIVE FOR CARCINOMA (0/1). - SEE ONCOLOGY TABLE.   09/14/2018 Cancer Staging   Staging form: Breast, AJCC 8th Edition - Pathologic stage from 09/14/2018: No Stage Recommended (ypT0, pN0, cM0, GX, ER: Not Assessed, PR: Not Assessed, HER2: Not Assessed) - Signed by Truitt Merle, MD on 09/23/2018   11/03/2018 - 12/10/2018 Radiation Therapy   Adjuvant Radiation with Dr. Isidore Moos    12/27/2018 -  Anti-estrogen oral therapy   Start Tamoxifen 49m daily in 12/27/2018 stopped after 1 week due to poor toleration. She started anastrozole on 02/04/19 but stopped on 02/14/19 due to worsening muscle aches. She tried Letrozole but did not tolerate. She was switched to Exemestane in late 04/2019.    01/04/2019 Imaging   CT chest  IMPRESSION: 1. Stable tiny subpleural and perifissural nodules bilaterally. No suspicious pulmonary nodule or mass. 2. No focal airspace disease in the right lung. 3.  Aortic Atherosclerois (ICD10-170.0)     03/08/2019 Imaging   CT AP W Contrast 03/08/19  IMPRESSION: 1. No metastatic disease in the abdomen pelvis. 2. No evidence adverse immunotherapy therapy reaction.   03/08/2019 Imaging   Whole body bone scan 03/08/19   IMPRESSION: No definite scintigraphic evidence of osseous metastases.   08/29/2019 - 08/2020 Chemotherapy   Started Nerlynx 2417mdaily with budesonide on 08/29/19. Reduced to 20029mn late 08/2019. Plan to complete in 08/2020      CURRENT THERAPY:  -Tamoxifen 50m9mlystarting6/1/2020stopped after 1 week due to poor toleration.She started anastrozole on 02/04/19 but stopped on 02/14/19 due to worsening muscle aches.She tried Letrozole but did not tolerate.She was switched to Exemestane in late10/2020. -StartedNerlynx240mg55mlywithbudesonideon 08/29/19. Reduced to 200mg 56mate 08/2019. Plan to complete in 08/2020  INTERVAL HISTORY:  JennifMichelena Culmerre for a follow up. She presents to the clinic alone. She notes she is doing well. She notes her diarrhea is improving on Nerlynx. She uses Imodium as needed. She notes her energy is fair. She notes occasional sore inside her nose. She tries to use Vaseline. She notes hot flashes have improved. She notes brian fog at times but she is also under stress because her father nearly passed. She notes she required 3 shots in her foot for pain and uses Meloxicam.  She notes she cannot fully empty her bladder. She denies dysuria but notes right pelvic pain occasionally. She is on Potassium but not taking it fully TID. She denies blood in her stool. She notes pain with sexual intercourse.    REVIEW OF SYSTEMS:   Constitutional: Denies fevers, chills or abnormal weight loss (+) Improved hot flashes  Eyes: Denies blurriness of vision  Ears, nose, mouth, throat, and face: Denies mucositis or sore throat Respiratory: Denies cough, dyspnea or wheezes Cardiovascular: Denies palpitation, chest discomfort or lower extremity swelling Gastrointestinal:  Denies nausea, heartburn (+) Improved diarrhea  Skin: Denies abnormal skin rashes Lymphatics: Denies new lymphadenopathy or easy bruising Neurological:Denies numbness, tingling or new  weaknesses Behavioral/Psych: Mood is stable, no new changes  All other systems were reviewed with the patient and are negative.  MEDICAL HISTORY:  Past Medical History:  Diagnosis Date  . Abdominal pain, unspecified site   . Anxiety state, unspecified   . Complication of anesthesia    convulsions - when she has appendectomy about 12 years ago at [facility]  . Contact dermatitis and other eczema, due to unspecified cause   . Essential hypertension, benign   . Family history of colon cancer   . Family history of melanoma   . Family history of prostate cancer   . GERD (gastroesophageal reflux disease)   . Hematuria, unspecified   . Hypertension   . Lower back pain   . Mixed hyperlipidemia   . Rosacea   . rt breast ca dx'd 01/2018   R breast cancer  . Unspecified symptom associated with female genital organs     SURGICAL HISTORY: Past Surgical History:  Procedure Laterality Date  . BREAST RECONSTRUCTION WITH PLACEMENT OF TISSUE EXPANDER AND ALLODERM Right 09/14/2018   Procedure: BREAST RECONSTRUCTION WITH PLACEMENT OF TISSUE EXPANDER AND ALLODERM;  Surgeon: Irene Limbo, MD;  Location: [facility];  Service: Plastics;  Laterality: Right;  . CESAREAN SECTION    . DIAGNOSTIC LAPAROSCOPY    . EYE SURGERY     Lasik  . LAPAROSCOPIC APPENDECTOMY    . LIPOSUCTION WITH LIPOFILLING Right 07/26/2019   Procedure: LIPOFILLING from abdomen to R chest;  Surgeon: Irene Limbo, MD;  Location: [place];  Service: Plastics;  Laterality: Right;  . MASTECTOMY WITH RADIOACTIVE SEED GUIDED EXCISION AND AXILLARY SENTINEL LYMPH NODE BIOPSY Right 09/14/2018   Procedure: RIGHT NIPPLE SPARING MASTECTOMY WITH TARGETED DEEP RIGHT AXILLARY LYMPH NODE BIOPSY WITH RADIOACTIVE SEED LOCALIZATION AND RIGHT AXILLARY SENTINEL LYMPH NODE BIOPSY, FROZEN SECTION AND POSSIBLE COMPLETION AXILLARY LYMPH NODE DISSECTION INJECT BLUE DYE RIGHT BREAST;  Surgeon: Fanny Skates, MD;  Location: [place];   Service: General;  Laterality: Right;  . PORT-A-CATH REMOVAL Left 07/26/2019   Procedure: REMOVAL PORT-A-CATH;  Surgeon: Irene Limbo, MD;  Location: [place];  Service: Plastics;  Laterality: Left;  . PORTACATH PLACEMENT Left 04/13/2018   Procedure: INSERTION PORT-A-CATH WITH [place];  Surgeon: Fanny Skates, MD;  Location: [place];  Service: General;  Laterality: Left;  . REMOVAL OF TISSUE EXPANDER AND PLACEMENT OF IMPLANT Right 07/26/2019   Procedure: REMOVAL OF TISSUE EXPANDER AND PLACEMENT OF IMPLANT;  Surgeon: Irene Limbo, MD;  Location: [place];  Service: Plastics;  Laterality: Right;  . UTERINE FIBROID SURGERY      I have reviewed the social history and family history with the patient and they are unchanged from previous note.  ALLERGIES:  is allergic to metrogel [metronidazole], other, paxil [paroxetine hcl], prednisone, sulfamethoxazole, and zomig [zolmitriptan].  MEDICATIONS:  Current Outpatient Medications  Medication Sig Dispense Refill  . ALPRAZolam (XANAX) 0.5 MG tablet Take 1 tablet (0.5 mg total) by mouth at bedtime as needed for anxiety or sleep. 30 tablet 0  . Ascorbic Acid (VITAMIN C) 1000 MG tablet Take 2,000 mg by mouth daily.    Marland Kitchen atenolol (TENORMIN) 50 MG tablet  Take 50 mg by mouth at bedtime.    Marland Kitchen azithromycin (ZITHROMAX) 250 MG tablet Take by mouth.    . Budesonide ER (ORTIKOS) 9 MG CP24 Take 1 capsule by mouth daily. 30 capsule 2  . cephALEXin (KEFLEX) 500 MG capsule Take 1 capsule (500 mg total) by mouth 3 (three) times daily. 21 capsule 0  . Cholecalciferol (VITAMIN D3) 50 MCG (2000 UT) capsule Take 2,000 Units by mouth daily.    . cyclobenzaprine (FLEXERIL) 5 MG tablet Take 1 tablet (5 mg total) by mouth 3 (three) times daily as needed for muscle spasms. 30 tablet 0  . diclofenac (VOLTAREN) 75 MG EC tablet Take 1 tablet (75 mg total) by mouth 2 (two) times daily. 50 tablet 2  . exemestane (AROMASIN) 25  MG tablet Take 1 tablet (25 mg total) by mouth daily after breakfast. 90 tablet 1  . ibuprofen (ADVIL) 400 MG tablet Take 400 mg by mouth 3 (three) times daily as needed.    . meloxicam (MOBIC) 15 MG tablet Take 1 tablet (15 mg total) by mouth daily. 30 tablet 2  . NERLYNX 40 MG tablet TAKE 6 TABLETS (240 MG TOTAL) BY MOUTH DAILY. 180 tablet 3  . omeprazole (PRILOSEC) 20 MG capsule Take 1 capsule (20 mg total) by mouth 2 (two) times daily before a meal. (Patient not taking: Reported on 01/03/2020) 60 capsule 2  . potassium chloride SA (KLOR-CON) 20 MEQ tablet TAKE 1 TABLET BY MOUTH EVERY DAY 30 tablet 3  . traMADol (ULTRAM) 50 MG tablet Take 1 tablet twice daily as needed for severe pain (Patient not taking: Reported on 01/03/2020) 30 tablet 0  . valACYclovir (VALTREX) 500 MG tablet valacyclovir 500 mg tablet  TAKE 1 TABLET BY MOUTH TWICE DAILY    . venlafaxine XR (EFFEXOR XR) 150 MG 24 hr capsule Take 1 capsule (150 mg total) by mouth daily with breakfast. 30 capsule 5  . Zinc 50 MG TABS Take 1 tablet by mouth daily.     No current facility-administered medications for this visit.    PHYSICAL EXAMINATION: ECOG PERFORMANCE STATUS: 0 - Asymptomatic  Vitals:   07/04/20 1004  BP: (!) 148/81  Pulse: 73  Resp: 18  Temp: 98.2 F (36.8 C)  SpO2: 100%   Filed Weights   07/04/20 1004  Weight: 160 lb 3.2 oz (72.7 kg)    GENERAL:alert, no distress and comfortable SKIN: skin color, texture, turgor are normal, no rashes or significant lesions EYES: normal, Conjunctiva are pink and non-injected, sclera clear NECK: supple, thyroid normal size, non-tender, without nodularity LYMPH:  no palpable lymphadenopathy in the cervical, axillary  LUNGS: clear to auscultation and percussion with normal breathing effort HEART: regular rate & rhythm and no murmurs and no lower extremity edema ABDOMEN:abdomen soft, non-tender and normal bowel sounds Musculoskeletal:no cyanosis of digits and no clubbing   NEURO: alert & oriented x 3 with fluent speech, no focal motor/sensory deficits BREAST: S/p right mastectomy and reconstruction: Surgical incision healed well. No palpable mass, nodules or adenopathy bilaterally. Breast exam benign.   LABORATORY DATA:  I have reviewed the data as listed CBC Latest Ref Rng & Units 05/04/2020 03/02/2020 12/30/2019  WBC 4.0 - 10.5 K/uL 6.0 6.1 5.0  Hemoglobin 12.0 - 15.0 g/dL 13.6 13.2 12.3  Hematocrit 36 - 46 % 37.8 36.9 34.5(L)  Platelets 150 - 400 K/uL 178 201 167     CMP Latest Ref Rng & Units 07/04/2020 05/04/2020 03/02/2020  Glucose 70 - 99 mg/dL 103(H)  91 107(H)  BUN 6 - 20 mg/dL _0 Creatinine 0.44 - 1.00 mg/dL 0.77 0.77 0.73  Sodium 135 - 145 mmol/L 143 144 141  Potassium 3.5 - 5.1 mmol/L 3.8 3.1(L) 3.2(L)  Chloride 98 - 111 mmol/L 107 105 107  CO2 22 - 32 mmol/L 28 33(H) 23  Calcium 8.9 - 10.3 mg/dL 9.4 9.8 9.8  Total Protein 6.5 - 8.1 g/dL 7.8 7.6 7.7  Total Bilirubin 0.3 - 1.2 mg/dL 0.7 0.8 0.8  Alkaline Phos 38 - 126 U/L 98 96 124  AST 15 - 41 U/L _1 ALT 0 - 44 U/L _2 RADIOGRAPHIC STUDIES: I have personally reviewed the radiological images as listed and agreed with the findings in the report. No results found.   ASSESSMENT & PLAN:  Carnell Casamento is a 54 y.o. female with    1. Malignant neoplasm of upper-inner quadrant of right breast in female, invasive ductal carcinoma, cT2N1M0 stage IB, ER+/PR+/HER2+, G3, ypT0N0 -She was diagnosed in 02/2018. She is s/pneoadjuvantchemoof 6 cyclesTCHP,right breast mastectomyandaxillary lymph node dissectionon 2/18/20and adjuvant radiation.She completed 1 year maintenance Herceptin/Perjeta in 03/2019. -She started antiestrogen therapy in 12/2018. She did not tolerate tamoxifen, Anastrozole or Letrozole. She is currently on Exemestane since 04/2019 tolerating much better.  -Sheunderwent reconstruction surgery and PAC removal byDr. Iran Planas on 07/26/19. -I  started her on anti-HER2 treatment with oral Nerlynxfor one year on 08/29/19. Shewasinitiallyable to titrate up to 239m Nerlynxdaily.Due to intermittent diarrheaand worsening anxiety/OCDshe reduce Nerlynx to 2042min late 08/2019.She is still on Budesonidedue to persistent diarrhea.  -She is clinically stable. She is tolerating Nerlynx with improving diarrhea. Will proceed with labs today. Physical exam stable.  -Continue surveillance. Next mammogram in 04/2021. She is also interested in MRI screening for beast cancer, will start MRIs in 10/2020 if her insurance approves.  -Continue Exemestane and Nerlynx 20067mPlan to complete Nerlynx in 08/2020.  -F/u in 3 months then every 6 months.   2.Anxiety, OCD,insomnia -She notes she has been more anxious lately with increased OCD and family stress.  -She is on Xanax 0.5mg52mbout4-5 night a week to help her sleep. She is onEffexor 150mg37me daily. -She continues to have mood swings occasionally.   3. Osteoporosis  -Her DEXA from 07/2019 showed lowest T-score -2.7 at left total femur.  -I discussed she is on Exemestane which can lower her bone density.  -Continue Calcium and Vit D once daily.  -To strengthen her bone and reduce risk of bone mets she started Zometa q6mont108month2 years on 03/02/20.Next dose in 09/2020.   4. Diarrhea, Hypokalemia  -She has diarrhea secondary to Nerlynx.  -Diarrhea has recently improved, now intermittent. She uses imodium as needed and continues budesonide and 200mg d82mNerlynx  -She has Hypokalemia and hypomagnesium secondary to her diarrhea. On oral potassium TID (not consistently) and mag.  -Mag normal lately.   5. Vaginal pain  -She has vaginal pain with sexual intercourse  -I discussed this is likely from antiestrogen therapy. I recommend she avoid estrogen containing medications or creams.  -I encouraged her to speak with her Gyn about possible Monalisa procedure for vaginal dilitation.     Plan: -Continue Nerlynx. Plan to complete in 08/2020  -Continue Exemestane  -Lab, f/u and Zometa in 3 months  -will order screening breast MRI on next visit    No problem-specific Assessment & Plan notes found for this encounter.   Orders Placed  This Encounter  Procedures  . CBC with Differential (Cancer Center Only)    Standing Status:   Standing    Number of Occurrences:   10    Standing Expiration Date:   07/04/2021  . CMP ([facility] only)    Standing Status:   Standing    Number of Occurrences:   10    Standing Expiration Date:   07/04/2021   All questions were answered. The patient knows to call the clinic with any problems, questions or concerns. No barriers to learning was detected. The total time spent in the appointment was 30 minutes.     Truitt Merle, MD 07/04/2020   I, Joslyn Devon, am acting as scribe for Truitt Merle, MD.   I have reviewed the above documentation for accuracy and completeness, and I agree with the above.

## 2020-07-04 ENCOUNTER — Other Ambulatory Visit: Payer: Self-pay

## 2020-07-04 ENCOUNTER — Encounter: Payer: Self-pay | Admitting: Hematology

## 2020-07-04 ENCOUNTER — Inpatient Hospital Stay: Payer: Medicaid Other

## 2020-07-04 ENCOUNTER — Inpatient Hospital Stay: Payer: Medicaid Other | Attending: Hematology | Admitting: Hematology

## 2020-07-04 VITALS — BP 148/81 | HR 73 | Temp 98.2°F | Resp 18 | Ht 64.0 in | Wt 160.2 lb

## 2020-07-04 DIAGNOSIS — F419 Anxiety disorder, unspecified: Secondary | ICD-10-CM | POA: Insufficient documentation

## 2020-07-04 DIAGNOSIS — Z808 Family history of malignant neoplasm of other organs or systems: Secondary | ICD-10-CM | POA: Diagnosis not present

## 2020-07-04 DIAGNOSIS — Z79811 Long term (current) use of aromatase inhibitors: Secondary | ICD-10-CM | POA: Insufficient documentation

## 2020-07-04 DIAGNOSIS — Z170 Estrogen receptor positive status [ER+]: Secondary | ICD-10-CM

## 2020-07-04 DIAGNOSIS — F429 Obsessive-compulsive disorder, unspecified: Secondary | ICD-10-CM | POA: Diagnosis not present

## 2020-07-04 DIAGNOSIS — G4700 Insomnia, unspecified: Secondary | ICD-10-CM | POA: Insufficient documentation

## 2020-07-04 DIAGNOSIS — C50211 Malignant neoplasm of upper-inner quadrant of right female breast: Secondary | ICD-10-CM

## 2020-07-04 DIAGNOSIS — Z800 Family history of malignant neoplasm of digestive organs: Secondary | ICD-10-CM | POA: Insufficient documentation

## 2020-07-04 DIAGNOSIS — N941 Unspecified dyspareunia: Secondary | ICD-10-CM | POA: Insufficient documentation

## 2020-07-04 DIAGNOSIS — Z79899 Other long term (current) drug therapy: Secondary | ICD-10-CM | POA: Insufficient documentation

## 2020-07-04 DIAGNOSIS — R197 Diarrhea, unspecified: Secondary | ICD-10-CM | POA: Diagnosis not present

## 2020-07-04 DIAGNOSIS — M810 Age-related osteoporosis without current pathological fracture: Secondary | ICD-10-CM | POA: Insufficient documentation

## 2020-07-04 DIAGNOSIS — E876 Hypokalemia: Secondary | ICD-10-CM | POA: Diagnosis not present

## 2020-07-04 LAB — MAGNESIUM: Magnesium: 2 mg/dL (ref 1.7–2.4)

## 2020-07-04 LAB — CMP (CANCER CENTER ONLY)
ALT: 12 U/L (ref 0–44)
AST: 21 U/L (ref 15–41)
Albumin: 4.3 g/dL (ref 3.5–5.0)
Alkaline Phosphatase: 98 U/L (ref 38–126)
Anion gap: 8 (ref 5–15)
BUN: 16 mg/dL (ref 6–20)
CO2: 28 mmol/L (ref 22–32)
Calcium: 9.4 mg/dL (ref 8.9–10.3)
Chloride: 107 mmol/L (ref 98–111)
Creatinine: 0.77 mg/dL (ref 0.44–1.00)
GFR, Estimated: >60 mL/min (ref >60)
Glucose, Bld: 103 mg/dL — ABNORMAL HIGH (ref 70–99)
Potassium: 3.8 mmol/L (ref 3.5–5.1)
Sodium: 143 mmol/L (ref 135–145)
Total Bilirubin: 0.7 mg/dL (ref 0.3–1.2)
Total Protein: 7.8 g/dL (ref 6.5–8.1)

## 2020-07-04 MED ORDER — EXEMESTANE 25 MG PO TABS: 25.0000 mg | ORAL_TABLET | Freq: Every day | ORAL | 1 refills | Status: DC

## 2020-07-05 ENCOUNTER — Telehealth: Payer: Self-pay | Admitting: Hematology

## 2020-07-05 LAB — CANCER ANTIGEN 27.29: CA 27.29: 24.5 U/mL (ref 0.0–38.6)

## 2020-07-05 NOTE — Telephone Encounter (Signed)
Scheduled appts per 12/8 los. Pt confirmed appt date and time.  

## 2020-08-01 ENCOUNTER — Other Ambulatory Visit: Payer: Self-pay | Admitting: Hematology

## 2020-08-01 DIAGNOSIS — F419 Anxiety disorder, unspecified: Secondary | ICD-10-CM

## 2020-08-03 NOTE — Telephone Encounter (Signed)
Refill request

## 2020-08-04 ENCOUNTER — Other Ambulatory Visit: Payer: Self-pay | Admitting: Hematology

## 2020-08-04 DIAGNOSIS — F419 Anxiety disorder, unspecified: Secondary | ICD-10-CM

## 2020-08-06 ENCOUNTER — Other Ambulatory Visit: Payer: Self-pay | Admitting: Hematology

## 2020-08-06 DIAGNOSIS — F419 Anxiety disorder, unspecified: Secondary | ICD-10-CM

## 2020-08-06 MED ORDER — ALPRAZOLAM 0.5 MG PO TABS: 0.5000 mg | ORAL_TABLET | Freq: Every evening | ORAL | 0 refills | Status: DC | PRN

## 2020-08-07 NOTE — Telephone Encounter (Signed)
For your review received 3 days ago

## 2020-08-19 ENCOUNTER — Other Ambulatory Visit: Payer: Self-pay | Admitting: Hematology

## 2020-08-19 DIAGNOSIS — C50211 Malignant neoplasm of upper-inner quadrant of right female breast: Secondary | ICD-10-CM

## 2020-08-19 DIAGNOSIS — Z170 Estrogen receptor positive status [ER+]: Secondary | ICD-10-CM

## 2020-08-22 ENCOUNTER — Other Ambulatory Visit: Payer: Self-pay | Admitting: Hematology

## 2020-08-22 DIAGNOSIS — Z170 Estrogen receptor positive status [ER+]: Secondary | ICD-10-CM

## 2020-08-22 DIAGNOSIS — C50211 Malignant neoplasm of upper-inner quadrant of right female breast: Secondary | ICD-10-CM

## 2020-08-23 ENCOUNTER — Other Ambulatory Visit: Payer: Self-pay

## 2020-08-23 DIAGNOSIS — C50211 Malignant neoplasm of upper-inner quadrant of right female breast: Secondary | ICD-10-CM

## 2020-08-23 DIAGNOSIS — Z170 Estrogen receptor positive status [ER+]: Secondary | ICD-10-CM

## 2020-08-23 MED ORDER — NERATINIB MALEATE 40 MG PO TABS: ORAL_TABLET | ORAL | 3 refills | Status: DC

## 2020-08-27 ENCOUNTER — Telehealth: Payer: Self-pay | Admitting: *Deleted

## 2020-08-27 NOTE — Telephone Encounter (Signed)
Received call from pt asking about Nerlyx.  She has one left & thinks she will have completed 1 year.  Verified with Dr Burr Medico & informed to finish & stop.  Verified with pharmacy that they had her refill on hand for exemestane.

## 2020-08-28 ENCOUNTER — Telehealth: Payer: Self-pay

## 2020-08-28 NOTE — Telephone Encounter (Signed)
Claire Guzman called stating she feels a hard lump the size of a pea, it is immobile located in the right chest, right above implant.

## 2020-08-29 ENCOUNTER — Other Ambulatory Visit: Payer: Medicaid Other

## 2020-08-29 NOTE — Progress Notes (Signed)
[place]   [phone] [phone]   Clinic Follow up Note   Patient Care Team: Lujean Amel, MD as PCP - General (Family Medicine) Fanny Skates, MD as Consulting Physician (General Surgery) Truitt Merle, MD as Consulting Physician (Hematology) Eppie Gibson, MD as Attending Physician (Radiation Oncology) Mauro Kaufmann, RN as Oncology Nurse Navigator Rockwell Germany, RN as Oncology Nurse Navigator  Date of Service:  08/30/2020  CHIEF COMPLAINT: F/u for right breast cancer  SUMMARY OF ONCOLOGIC HISTORY: Oncology History Overview Note  Cancer Staging Malignant neoplasm of upper-inner quadrant of right breast in female, estrogen receptor positive ([facility]) Staging form: Breast, AJCC 8th Edition - Clinical stage from 03/08/2018: Stage IB (cT2, cN1, cM0, G3, ER+, PR+, HER2+) - Signed by Truitt Merle, MD on 03/16/2018     Malignant neoplasm of upper-inner quadrant of right breast in female, estrogen receptor positive ([place])  02/24/2018 Mammogram   screening mammogram on 02/24/2018 that was benign   03/04/2018 Mammogram   diagnostic mammogram on 03/04/2018 due to a palpable mass on her right breast. Diagnostic mammogram, breast US revealed and biopsy revealed suspicious mass in the right breast at 2:30 at the palpable site of concern and one suspicious right axillary LN.   03/08/2018 Cancer Staging   Staging form: Breast, AJCC 8th Edition - Clinical stage from 03/08/2018: Stage IB (cT2, cN1, cM0, G3, ER+, PR+, HER2+) - Signed by Truitt Merle, MD on 03/16/2018   03/08/2018 Receptors her2   Her2 positive, ER 90% PR 5% and Ki67 60%   03/08/2018 Pathology Results   Diagnosis 1. Breast, right, needle core biopsy, 2:30 - INVASIVE DUCTAL CARCINOMA, GRADE 3. - LYMPHOVASCULAR SPACE INVOLVEMENT BY TUMOR. 2. Lymph node, needle/core biopsy, right axilla - METASTATIC CARCINOMA INVOLVING SCANT LYMPHOID TISSUE.   03/10/2018 Initial Diagnosis   Malignant neoplasm of  upper-inner quadrant of right breast in female, estrogen receptor positive ([place])   03/20/2018 Imaging   MRI brain 03/20/18 IMPRESSION: No evidence of metastatic disease.  Normal appearance of brain. Slightly heterogeneous marrow pattern of the upper cervical spine, nonspecific but likely benign, especially in the setting of early stage disease.    03/24/2018 Echocardiogram   Baseline ECHO 03/24/18 Study Conclusions - Left ventricle: The cavity size was normal. Wall thickness was   increased in a pattern of mild LVH. Systolic function was normal.   The estimated ejection fraction was in the range of 60% to 65%.   Wall motion was normal; there were no regional wall motion   abnormalities. Doppler parameters are consistent with abnormal   left ventricular relaxation (grade 1 diastolic dysfunction). - Right ventricle: The cavity size was mildly dilated. Wall   thickness was normal.   03/25/2018 Imaging   MRI Breast B/l 03/25/18 IMPRESSION: 1. The patient's primary malignancy in the posterior right breast measures 3.2 x 2 x 2.6 cm with apparent invasion of the underlying pectoralis muscle. Numerous surrounding abnormal enhancing masses, consistent with satellite lesions, extend from 11 o'clock in the right breast into the inferolateral right breast with a total span of suspected disease measuring 4.5 x 4.6 x 6.4 cm in AP, transverse, and craniocaudal dimension. The suspected extent of disease involves the superior and inferolateral quadrants. The primary malignancy also extends just across midline into the superior medial quadrant. 2. No MRI evidence of malignancy in the left breast. 3. Known metastatic noted in the right axilla.   03/25/2018 Imaging   Whole body bone scan 03/25/18 IMPRESSION: No scintigraphic evidence skeletal  metastasis.   03/25/2018 Imaging   CT CAP W Contrast 03/25/18 IMPRESSION: 1. Mass within the deep aspect of the right breast along the pectoralis muscle  compatible with primary breast malignancy. 2. There is suggestion of two additional possible masses within the lateral aspect of the right breast versus nodular appearing breast tissue. Recommend dedicated evaluation with bilateral breast MRI. 3. Mildly thickened right axillary lymph node compatible with metastatic adenopathy, recently biopsied. 4. Multiple bilateral pulmonary nodules are indeterminate, potentially sequelae of prior infectious/inflammatory process. Recommend attention on follow-up. Consider follow-up CT in 6 months. 5. Portal venous gas is demonstrated within the left hepatic lobe. Additionally, there is wall thickening of the cecum and ascending colon with small amount of gas within the pericolonic vasculature. Constellation of findings is indeterminate in etiology however may be secondary to colitis at this location with considerations including infectious, inflammatory or ischemic etiologies. Correlate for recent procedure. If patient is not up-to-date for colonic screening, recommend further evaluation with colonoscopy to exclude the possibility of colonic mass within the cecum/ascending colon. 6. These results were called by telephone at the time of interpretation on 03/25/2018 at 9:31 am to Dr. Truitt Merle , who verbally acknowledged these results.   03/30/2018 - 04/08/2019 Chemotherapy   Neoadjuvant TCHP every 3 weeks starting 03/30/18. Completed 6 cycles of TCHP on 07/23/18.  Continued with maintenance Herceptin/Perjeta q3weeks to complete 1 year.    04/09/2018 Genetic Testing   Negative genetic testing on the [employer] Common Hereditary Cancers Panel. The Common Hereditary Cancers Panel offered by [facility] includes sequencing and/or deletion duplication testing of the following 47 genes: APC, ATM, AXIN2, BARD1, BMPR1A, BRCA1, BRCA2, BRIP1, CDH1, CDKN2A (p14ARF), CDKN2A (p16INK4a), CKD4, CHEK2, CTNNA1, DICER1, EPCAM (Deletion/duplication testing only), GREM1 (promoter region  deletion/duplication testing only), KIT, MEN1, MLH1, MSH2, MSH3, MSH6, MUTYH, NBN, NF1, NHTL1, PALB2, PDGFRA, PMS2, POLD1, POLE, PTEN, RAD50, RAD51C, RAD51D, SDHB, SDHC, SDHD, SMAD4, SMARCA4. STK11, TP53, TSC1, TSC2, and VHL.  The following genes were evaluated for sequence changes only: SDHA and HOXB13 c.251G>A variant only.   Genetic testing did detect a Variant of Unknown Significance (VUS) in the POLD1 gene called c.985C>T (p.Pro329Ser). At this time, it is unknown if this variant is associated with increased cancer risk or if this is a normal finding, but most variants such as this get reclassified to being inconsequential. It should not be used to make medical management decisions.   The report date is 04/09/2018.   07/24/2018 Breast MRI   IMPRESSION: 1. Resolution of the enhancing mass (known cancer) and small satellite lesions previously seen in the right breast following chemotherapy.  2. The metastatic lymph node in the right axilla has decreased in size.  3.  No MRI evidence of left breast malignancy.   09/14/2018 Surgery   RIGHT NIPPLE SPARING MASTECTOMY WITH TARGETED DEEP RIGHT AXILLARY LYMPH NODE BIOPSY WITH RADIOACTIVE SEED LOCALIZATION AND RIGHT AXILLARY SENTINEL LYMPH NODE BIOPSY by Dr. Dalbert Batman  09/14/18    09/14/2018 Pathology Results   Diagnosis 1. Lymph node, sentinel, biopsy, right axillary with radioactive seed - ONE OF ONE LYMPH NODES NEGATIVE FOR CARCINOMA (0/1). - MILD TREATMENT EFFECT. - BIOPSY SITE. 2. Lymph node, sentinel, biopsy, right axillary - ONE OF ONE LYMPH NODES NEGATIVE FOR CARCINOMA (0/1). 3. Lymph node, sentinel, biopsy, right - ONE OF ONE LYMPH NODES NEGATIVE FOR CARCINOMA (0/1). 4. Lymph node, sentinel, biopsy, right - ONE OF ONE LYMPH NODES NEGATIVE FOR CARCINOMA (0/1). 5. Lymph node, sentinel, biopsy, right - ONE OF  ONE LYMPH NODES NEGATIVE FOR CARCINOMA (0/1). 6. Lymph node, sentinel, biopsy, right - ONE OF ONE LYMPH NODES NEGATIVE FOR  CARCINOMA (0/1). 7. Lymph node, sentinel, biopsy, right - ONE OF ONE LYMPH NODES NEGATIVE FOR CARCINOMA (0/1). 8. Lymph node, sentinel, biopsy, right - ONE OF ONE LYMPH NODES NEGATIVE FOR CARCINOMA (0/1). 9. Lymph node, sentinel, biopsy, right - ONE OF ONE LYMPH NODES NEGATIVE FOR CARCINOMA (0/1). 10. Lymph node, sentinel, biopsy, right - ONE OF ONE LYMPH NODES NEGATIVE FOR CARCINOMA (0/1). 11. Lymph node, sentinel, biopsy, right - ONE OF ONE LYMPH NODES NEGATIVE FOR CARCINOMA (0/1). 12. Lymph node, sentinel, biopsy, right - ONE OF ONE LYMPH NODES NEGATIVE FOR CARCINOMA (0/1). 13. Nipple Biopsy, right - BENIGN NIPPLE TISSUE. 14. Breast, simple mastectomy, right - NO RESIDUAL CARCINOMA IDENTIFIED. - TREATMENT EFFECT. - FIBROCYSTIC CHANGE. - ONE OF ONE LYMPH NODES NEGATIVE FOR CARCINOMA (0/1). - SEE ONCOLOGY TABLE.   09/14/2018 Cancer Staging   Staging form: Breast, AJCC 8th Edition - Pathologic stage from 09/14/2018: No Stage Recommended (ypT0, pN0, cM0, GX, ER: Not Assessed, PR: Not Assessed, HER2: Not Assessed) - Signed by Truitt Merle, MD on 09/23/2018   11/03/2018 - 12/10/2018 Radiation Therapy   Adjuvant Radiation with Dr. Isidore Moos    12/27/2018 -  Anti-estrogen oral therapy   Start Tamoxifen 20mg  daily in 12/27/2018 stopped after 1 week due to poor toleration. She started anastrozole on 02/04/19 but stopped on 02/14/19 due to worsening muscle aches. She tried Letrozole but did not tolerate. She was switched to Exemestane in late 04/2019.    01/04/2019 Imaging   CT chest  IMPRESSION: 1. Stable tiny subpleural and perifissural nodules bilaterally. No suspicious pulmonary nodule or mass. 2. No focal airspace disease in the right lung. 3.  Aortic Atherosclerois (ICD10-170.0)     03/08/2019 Imaging   CT AP W Contrast 03/08/19  IMPRESSION: 1. No metastatic disease in the abdomen pelvis. 2. No evidence adverse immunotherapy therapy reaction.   03/08/2019 Imaging   Whole body bone scan 03/08/19   IMPRESSION: No definite scintigraphic evidence of osseous metastases.   08/29/2019 - 08/2020 Chemotherapy   Started Nerlynx 240mg  daily with budesonide on 08/29/19. Reduced to 200mg  in late 08/2019. Plan to complete in 08/2020      CURRENT THERAPY:  -Tamoxifen 20mg dailystarting6/1/2020stopped after 1 week due to poor toleration.She started anastrozole on 02/04/19 but stopped on 02/14/19 due to worsening muscle aches.She tried Letrozole but did not tolerate.She was switched to Exemestane in late10/2020. -StartedNerlynx240mg  dailywithbudesonideon 08/29/19. Reduced to 200mg  in late 08/2019. Plan to complete in 08/2020    INTERVAL HISTORY:  Claire Guzman is here for an urgent visit due to a new lump in her right breast. She presents to the clinic alone. She noticed some discomfort in the right shoulder blade for the past few weeks, no limited range of right shoulder.  2 days ago, she incidentally noticed a pea-sized lump in the right upper quadrant of right breast, right above the implant.  No skin change or nipple discharge.  She also noticed some discomfort in right axilla for past few days.  She completed Nerlynx yesterday. All other systems were reviewed with the patient and are negative.  MEDICAL HISTORY:  Past Medical History:  Diagnosis Date  . Abdominal pain, unspecified site   . Anxiety state, unspecified   . Complication of anesthesia    convulsions - when she has appendectomy about 12 years ago at [facility]  . Contact dermatitis and other eczema,  due to unspecified cause   . Essential hypertension, benign   . Family history of colon cancer   . Family history of melanoma   . Family history of prostate cancer   . GERD (gastroesophageal reflux disease)   . Hematuria, unspecified   . Hypertension   . Lower back pain   . Mixed hyperlipidemia   . Rosacea   . rt breast ca dx'd 01/2018   R breast cancer  . Unspecified symptom associated with female genital organs      SURGICAL HISTORY: Past Surgical History:  Procedure Laterality Date  . BREAST RECONSTRUCTION WITH PLACEMENT OF TISSUE EXPANDER AND ALLODERM Right 09/14/2018   Procedure: BREAST RECONSTRUCTION WITH PLACEMENT OF TISSUE EXPANDER AND ALLODERM;  Surgeon: Irene Limbo, MD;  Location: [facility];  Service: Plastics;  Laterality: Right;  . CESAREAN SECTION    . DIAGNOSTIC LAPAROSCOPY    . EYE SURGERY     Lasik  . LAPAROSCOPIC APPENDECTOMY    . LIPOSUCTION WITH LIPOFILLING Right 07/26/2019   Procedure: LIPOFILLING from abdomen to R chest;  Surgeon: Irene Limbo, MD;  Location: [place];  Service: Plastics;  Laterality: Right;  . MASTECTOMY WITH RADIOACTIVE SEED GUIDED EXCISION AND AXILLARY SENTINEL LYMPH NODE BIOPSY Right 09/14/2018   Procedure: RIGHT NIPPLE SPARING MASTECTOMY WITH TARGETED DEEP RIGHT AXILLARY LYMPH NODE BIOPSY WITH RADIOACTIVE SEED LOCALIZATION AND RIGHT AXILLARY SENTINEL LYMPH NODE BIOPSY, FROZEN SECTION AND POSSIBLE COMPLETION AXILLARY LYMPH NODE DISSECTION INJECT BLUE DYE RIGHT BREAST;  Surgeon: Fanny Skates, MD;  Location: [place];  Service: General;  Laterality: Right;  . PORT-A-CATH REMOVAL Left 07/26/2019   Procedure: REMOVAL PORT-A-CATH;  Surgeon: Irene Limbo, MD;  Location: [place];  Service: Plastics;  Laterality: Left;  . PORTACATH PLACEMENT Left 04/13/2018   Procedure: INSERTION PORT-A-CATH WITH [place];  Surgeon: Fanny Skates, MD;  Location: [place];  Service: General;  Laterality: Left;  . REMOVAL OF TISSUE EXPANDER AND PLACEMENT OF IMPLANT Right 07/26/2019   Procedure: REMOVAL OF TISSUE EXPANDER AND PLACEMENT OF IMPLANT;  Surgeon: Irene Limbo, MD;  Location: [place];  Service: Plastics;  Laterality: Right;  . UTERINE FIBROID SURGERY      I have reviewed the social history and family history with the patient and they are unchanged from previous note.  ALLERGIES:  is allergic to  metrogel [metronidazole], other, paxil [paroxetine hcl], prednisone, sulfamethoxazole, and zomig [zolmitriptan].  MEDICATIONS:  Current Outpatient Medications  Medication Sig Dispense Refill  . ALPRAZolam (XANAX) 0.5 MG tablet Take 1 tablet (0.5 mg total) by mouth at bedtime as needed for anxiety or sleep. 30 tablet 0  . Ascorbic Acid (VITAMIN C) 1000 MG tablet Take 2,000 mg by mouth daily.    Marland Kitchen atenolol (TENORMIN) 50 MG tablet Take 50 mg by mouth at bedtime.    Marland Kitchen azithromycin (ZITHROMAX) 250 MG tablet Take by mouth.    . Budesonide ER (ORTIKOS) 9 MG CP24 Take 1 capsule by mouth daily. 30 capsule 2  . cephALEXin (KEFLEX) 500 MG capsule Take 1 capsule (500 mg total) by mouth 3 (three) times daily. 21 capsule 0  . Cholecalciferol (VITAMIN D3) 50 MCG (2000 UT) capsule Take 2,000 Units by mouth daily.    . cyclobenzaprine (FLEXERIL) 5 MG tablet Take 1 tablet (5 mg total) by mouth 3 (three) times daily as needed for muscle spasms. 30 tablet 0  . diclofenac (VOLTAREN) 75 MG EC tablet Take 1 tablet (75 mg total) by mouth 2 (two)  times daily. 50 tablet 2  . exemestane (AROMASIN) 25 MG tablet Take 1 tablet (25 mg total) by mouth daily after breakfast. 90 tablet 1  . ibuprofen (ADVIL) 400 MG tablet Take 400 mg by mouth 3 (three) times daily as needed.    . meloxicam (MOBIC) 15 MG tablet Take 1 tablet (15 mg total) by mouth daily. 30 tablet 2  . Neratinib Maleate (NERLYNX) 40 MG tablet TAKE 6 TABLETS (240 MG TOTAL) BY MOUTH DAILY. 180 tablet 3  . omeprazole (PRILOSEC) 20 MG capsule Take 1 capsule (20 mg total) by mouth 2 (two) times daily before a meal. (Patient not taking: Reported on 01/03/2020) 60 capsule 2  . potassium chloride SA (KLOR-CON) 20 MEQ tablet TAKE 1 TABLET BY MOUTH EVERY DAY 30 tablet 3  . traMADol (ULTRAM) 50 MG tablet Take 1 tablet twice daily as needed for severe pain (Patient not taking: Reported on 01/03/2020) 30 tablet 0  . valACYclovir (VALTREX) 500 MG tablet valacyclovir 500 mg  tablet  TAKE 1 TABLET BY MOUTH TWICE DAILY    . venlafaxine XR (EFFEXOR XR) 150 MG 24 hr capsule Take 1 capsule (150 mg total) by mouth daily with breakfast. 30 capsule 5  . Zinc 50 MG TABS Take 1 tablet by mouth daily.     No current facility-administered medications for this visit.    PHYSICAL EXAMINATION: ECOG PERFORMANCE STATUS: 0 - Asymptomatic  Vitals:   08/30/20 1318  BP: 139/61  Pulse: 75  Resp: 15  Temp: (!) 97.5 F (36.4 C)  SpO2: 100%   Filed Weights   08/30/20 1318  Weight: 162 lb 11.2 oz (73.8 kg)    GENERAL:alert, no distress and comfortable SKIN: skin color, texture, turgor are normal, no rashes or significant lesions EYES: normal, Conjunctiva are pink and non-injected, sclera clear NECK: supple, thyroid normal size, non-tender, without nodularity LYMPH:  no palpable lymphadenopathy in the cervical, axillary  LUNGS: clear to auscultation and percussion with normal breathing effort HEART: regular rate & rhythm and no murmurs and no lower extremity edema ABDOMEN:abdomen soft, non-tender and normal bowel sounds Musculoskeletal:no cyanosis of digits and no clubbing  NEURO: alert & oriented x 3 with fluent speech, no focal motor/sensory deficits Breasts: Breast inspection showed status post right mastectomy and implant placement.  I can feel pea-sized hard nodule at the age of implant in the right upper quadrant of right breast, it is only palpable when she sits, not palpable when she lays down. Palpation of the left breast and axilla revealed no obvious mass that I could appreciate.   LABORATORY DATA:  I have reviewed the data as listed CBC Latest Ref Rng & Units 05/04/2020 03/02/2020 12/30/2019  WBC 4.0 - 10.5 K/uL 6.0 6.1 5.0  Hemoglobin 12.0 - 15.0 g/dL 13.6 13.2 12.3  Hematocrit 36.0 - 46.0 % 37.8 36.9 34.5(L)  Platelets 150 - 400 K/uL 178 201 167     CMP Latest Ref Rng & Units 07/04/2020 05/04/2020 03/02/2020  Glucose 70 - 99 mg/dL 103(H) 91 107(H)  BUN 6 - 20  mg/dL $Remo'16 11 10  'qFuOa$ Creatinine 0.44 - 1.00 mg/dL 0.77 0.77 0.73  Sodium 135 - 145 mmol/L 143 144 141  Potassium 3.5 - 5.1 mmol/L 3.8 3.1(L) 3.2(L)  Chloride 98 - 111 mmol/L 107 105 107  CO2 22 - 32 mmol/L 28 33(H) 23  Calcium 8.9 - 10.3 mg/dL 9.4 9.8 9.8  Total Protein 6.5 - 8.1 g/dL 7.8 7.6 7.7  Total Bilirubin 0.3 - 1.2 mg/dL  0.7 0.8 0.8  Alkaline Phos 38 - 126 U/L 98 96 124  AST 15 - 41 U/L $Remo'21 17 16  'Roaun$ ALT 0 - 44 U/L $Remo'12 11 11      'zsRaH$ RADIOGRAPHIC STUDIES: I have personally reviewed the radiological images as listed and agreed with the findings in the report. No results found.   ASSESSMENT & PLAN:  Shaton Lore is a 55 y.o. female with    1. Right breast lump  -There is a pea-sized hard nodule at the edge of the implant in the right upper quadrant of right breast, slightly tender, no signs of inflammation on exam. -We will get ultrasound for further evaluation -We will discuss with her plastic surgeon Dr. Iran Planas    2. Malignant neoplasm of upper-inner quadrant of right breast in female, invasive ductal carcinoma, cT2N1M0 stage IB, ER+/PR+/HER2+, G3, ypT0N0 -She was diagnosed in 02/2018. She is s/pneoadjuvantchemoof 6 cyclesTCHP,right breast mastectomyandaxillary lymph node dissectionon 2/18/20and adjuvant radiation.She completed 1 year maintenance Herceptin/Perjeta in 03/2019. -She started antiestrogen therapy in 12/2018. She did not tolerate tamoxifen, Anastrozole or Letrozole. She is currently on Exemestane since 10/2020tolerating much better. -Sheunderwent reconstruction surgery and PAC removal byDr. Iran Planas on 07/26/19. -I started her on anti-HER2 treatment with oral Nerlynxfor one yearon 08/29/19. Shewasinitiallyable to titrate up to $Rem'240mg'rbBt$  Nerlynxdaily.Due tointermittent diarrheaand worsening anxiety/OCDshe reduce Nerlynx to $RemoveBe'200mg'EaoVNCGEQ$  in late 08/2019.she has completed one year therapy yesterday.    Plan: -Ultrasound of right breast and axilla next  week -will discuss with Dr. Iran Planas -she will return for zometa infusion in next few weeks, will get her recent lab from her PCP -lab and f/u in 3 months   No problem-specific Assessment & Plan notes found for this encounter.   Orders Placed This Encounter  Procedures  . US BREAST LTD UNI RIGHT INC AXILLA    Standing Status:   Future    Standing Expiration Date:   08/30/2021    Order Specific Question:   Reason for Exam (SYMPTOM  OR DIAGNOSIS REQUIRED)    Answer:   a small noduel at 10::00 at edge of her inplant, rule out local recurrence    Order Specific Question:   Preferred imaging location?    Answer:   [facility]   All questions were answered. The patient knows to call the clinic with any problems, questions or concerns. No barriers to learning was detected. The total time spent in the appointment was 20 minutes.     Truitt Merle, MD 08/30/2020   I, Joslyn Devon, am acting as scribe for Truitt Merle, MD.   I have reviewed the above documentation for accuracy and completeness, and I agree with the above.

## 2020-08-30 ENCOUNTER — Other Ambulatory Visit: Payer: Self-pay

## 2020-08-30 ENCOUNTER — Inpatient Hospital Stay: Payer: Medicaid Other | Attending: Hematology | Admitting: Hematology

## 2020-08-30 VITALS — BP 139/61 | HR 75 | Temp 97.5°F | Resp 15 | Ht 64.0 in | Wt 162.7 lb

## 2020-08-30 DIAGNOSIS — Z79811 Long term (current) use of aromatase inhibitors: Secondary | ICD-10-CM | POA: Insufficient documentation

## 2020-08-30 DIAGNOSIS — Z9221 Personal history of antineoplastic chemotherapy: Secondary | ICD-10-CM | POA: Diagnosis not present

## 2020-08-30 DIAGNOSIS — Z170 Estrogen receptor positive status [ER+]: Secondary | ICD-10-CM | POA: Insufficient documentation

## 2020-08-30 DIAGNOSIS — Z800 Family history of malignant neoplasm of digestive organs: Secondary | ICD-10-CM | POA: Diagnosis not present

## 2020-08-30 DIAGNOSIS — Z9011 Acquired absence of right breast and nipple: Secondary | ICD-10-CM | POA: Diagnosis not present

## 2020-08-30 DIAGNOSIS — C50211 Malignant neoplasm of upper-inner quadrant of right female breast: Secondary | ICD-10-CM | POA: Diagnosis not present

## 2020-08-30 DIAGNOSIS — Z8042 Family history of malignant neoplasm of prostate: Secondary | ICD-10-CM | POA: Diagnosis not present

## 2020-08-30 DIAGNOSIS — Z808 Family history of malignant neoplasm of other organs or systems: Secondary | ICD-10-CM | POA: Diagnosis not present

## 2020-08-31 ENCOUNTER — Encounter: Payer: Self-pay | Admitting: Hematology

## 2020-08-31 NOTE — Progress Notes (Signed)
Per Dr Burr Medico, Outside labs drawn on 08/22/2020 Ca 9.6, Creatinine 0.65. Ok to give Zometa.  Copy of these labs have been sent to HIM for scan.

## 2020-09-03 ENCOUNTER — Telehealth: Payer: Self-pay | Admitting: Hematology

## 2020-09-03 NOTE — Telephone Encounter (Signed)
Scheduled appts per 2/3 los. Pt confirmed new appt dates and times.

## 2020-09-05 ENCOUNTER — Other Ambulatory Visit: Payer: Self-pay | Admitting: Hematology

## 2020-09-05 DIAGNOSIS — F419 Anxiety disorder, unspecified: Secondary | ICD-10-CM

## 2020-09-06 ENCOUNTER — Other Ambulatory Visit: Payer: Self-pay | Admitting: Hematology

## 2020-09-06 DIAGNOSIS — F419 Anxiety disorder, unspecified: Secondary | ICD-10-CM

## 2020-09-06 MED ORDER — ALPRAZOLAM 0.5 MG PO TABS: 0.5000 mg | ORAL_TABLET | Freq: Every evening | ORAL | 0 refills | Status: DC | PRN

## 2020-09-13 ENCOUNTER — Ambulatory Visit
Admission: RE | Admit: 2020-09-13 | Discharge: 2020-09-13 | Disposition: A | Discharge status: Home / Self Care | Payer: Medicaid Other | Source: Ambulatory Visit | Attending: Hematology | Admitting: Hematology

## 2020-09-13 ENCOUNTER — Other Ambulatory Visit: Payer: Self-pay

## 2020-09-13 DIAGNOSIS — C50211 Malignant neoplasm of upper-inner quadrant of right female breast: Secondary | ICD-10-CM

## 2020-09-13 DIAGNOSIS — Z170 Estrogen receptor positive status [ER+]: Secondary | ICD-10-CM

## 2020-09-14 NOTE — Progress Notes (Incomplete)
[place]   [phone] [phone]   Clinic Follow up Note   Patient Care Team: Lujean Amel, MD as PCP - General (Family Medicine) Fanny Skates, MD as Consulting Physician (General Surgery) Truitt Merle, MD as Consulting Physician (Hematology) Eppie Gibson, MD as Attending Physician (Radiation Oncology) Mauro Kaufmann, RN as Oncology Nurse Navigator Rockwell Germany, RN as Oncology Nurse Navigator  Date of Service:  09/14/2020  CHIEF COMPLAINT: F/u for right breast cancer  SUMMARY OF ONCOLOGIC HISTORY: Oncology History Overview Note  Cancer Staging Malignant neoplasm of upper-inner quadrant of right breast in female, estrogen receptor positive ([place]) Staging form: Breast, AJCC 8th Edition - Clinical stage from 03/08/2018: Stage IB (cT2, cN1, cM0, G3, ER+, PR+, HER2+) - Signed by Truitt Merle, MD on 03/16/2018     Malignant neoplasm of upper-inner quadrant of right breast in female, estrogen receptor positive ([place])  02/24/2018 Mammogram   screening mammogram on 02/24/2018 that was benign   03/04/2018 Mammogram   diagnostic mammogram on 03/04/2018 due to a palpable mass on her right breast. Diagnostic mammogram, breast US revealed and biopsy revealed suspicious mass in the right breast at 2:30 at the palpable site of concern and one suspicious right axillary LN.   03/08/2018 Cancer Staging   Staging form: Breast, AJCC 8th Edition - Clinical stage from 03/08/2018: Stage IB (cT2, cN1, cM0, G3, ER+, PR+, HER2+) - Signed by Truitt Merle, MD on 03/16/2018   03/08/2018 Receptors her2   Her2 positive, ER 90% PR 5% and Ki67 60%   03/08/2018 Pathology Results   Diagnosis 1. Breast, right, needle core biopsy, 2:30 - INVASIVE DUCTAL CARCINOMA, GRADE 3. - LYMPHOVASCULAR SPACE INVOLVEMENT BY TUMOR. 2. Lymph node, needle/core biopsy, right axilla - METASTATIC CARCINOMA INVOLVING SCANT LYMPHOID TISSUE.   03/10/2018 Initial Diagnosis   Malignant neoplasm of  upper-inner quadrant of right breast in female, estrogen receptor positive ([place])   03/20/2018 Imaging   MRI brain 03/20/18 IMPRESSION: No evidence of metastatic disease.  Normal appearance of brain. Slightly heterogeneous marrow pattern of the upper cervical spine, nonspecific but likely benign, especially in the setting of early stage disease.    03/24/2018 Echocardiogram   Baseline ECHO 03/24/18 Study Conclusions - Left ventricle: The cavity size was normal. Wall thickness was   increased in a pattern of mild LVH. Systolic function was normal.   The estimated ejection fraction was in the range of 60% to 65%.   Wall motion was normal; there were no regional wall motion   abnormalities. Doppler parameters are consistent with abnormal   left ventricular relaxation (grade 1 diastolic dysfunction). - Right ventricle: The cavity size was mildly dilated. Wall   thickness was normal.   03/25/2018 Imaging   MRI Breast B/l 03/25/18 IMPRESSION: 1. The patient's primary malignancy in the posterior right breast measures 3.2 x 2 x 2.6 cm with apparent invasion of the underlying pectoralis muscle. Numerous surrounding abnormal enhancing masses, consistent with satellite lesions, extend from 11 o'clock in the right breast into the inferolateral right breast with a total span of suspected disease measuring 4.5 x 4.6 x 6.4 cm in AP, transverse, and craniocaudal dimension. The suspected extent of disease involves the superior and inferolateral quadrants. The primary malignancy also extends just across midline into the superior medial quadrant. 2. No MRI evidence of malignancy in the left breast. 3. Known metastatic noted in the right axilla.   03/25/2018 Imaging   Whole body bone scan 03/25/18 IMPRESSION: No scintigraphic evidence skeletal  metastasis.   03/25/2018 Imaging   CT CAP W Contrast 03/25/18 IMPRESSION: 1. Mass within the deep aspect of the right breast along the pectoralis muscle  compatible with primary breast malignancy. 2. There is suggestion of two additional possible masses within the lateral aspect of the right breast versus nodular appearing breast tissue. Recommend dedicated evaluation with bilateral breast MRI. 3. Mildly thickened right axillary lymph node compatible with metastatic adenopathy, recently biopsied. 4. Multiple bilateral pulmonary nodules are indeterminate, potentially sequelae of prior infectious/inflammatory process. Recommend attention on follow-up. Consider follow-up CT in 6 months. 5. Portal venous gas is demonstrated within the left hepatic lobe. Additionally, there is wall thickening of the cecum and ascending colon with small amount of gas within the pericolonic vasculature. Constellation of findings is indeterminate in etiology however may be secondary to colitis at this location with considerations including infectious, inflammatory or ischemic etiologies. Correlate for recent procedure. If patient is not up-to-date for colonic screening, recommend further evaluation with colonoscopy to exclude the possibility of colonic mass within the cecum/ascending colon. 6. These results were called by telephone at the time of interpretation on 03/25/2018 at 9:31 am to Dr. Truitt Merle , who verbally acknowledged these results.   03/30/2018 - 04/08/2019 Chemotherapy   Neoadjuvant TCHP every 3 weeks starting 03/30/18. Completed 6 cycles of TCHP on 07/23/18.  Continued with maintenance Herceptin/Perjeta q3weeks to complete 1 year.    04/09/2018 Genetic Testing   Negative genetic testing on the [employer] Common Hereditary Cancers Panel. The Common Hereditary Cancers Panel offered by [facility] includes sequencing and/or deletion duplication testing of the following 47 genes: APC, ATM, AXIN2, BARD1, BMPR1A, BRCA1, BRCA2, BRIP1, CDH1, CDKN2A (p14ARF), CDKN2A (p16INK4a), CKD4, CHEK2, CTNNA1, DICER1, EPCAM (Deletion/duplication testing only), GREM1 (promoter region  deletion/duplication testing only), KIT, MEN1, MLH1, MSH2, MSH3, MSH6, MUTYH, NBN, NF1, NHTL1, PALB2, PDGFRA, PMS2, POLD1, POLE, PTEN, RAD50, RAD51C, RAD51D, SDHB, SDHC, SDHD, SMAD4, SMARCA4. STK11, TP53, TSC1, TSC2, and VHL.  The following genes were evaluated for sequence changes only: SDHA and HOXB13 c.251G>A variant only.   Genetic testing did detect a Variant of Unknown Significance (VUS) in the POLD1 gene called c.985C>T (p.Pro329Ser). At this time, it is unknown if this variant is associated with increased cancer risk or if this is a normal finding, but most variants such as this get reclassified to being inconsequential. It should not be used to make medical management decisions.   The report date is 04/09/2018.   07/24/2018 Breast MRI   IMPRESSION: 1. Resolution of the enhancing mass (known cancer) and small satellite lesions previously seen in the right breast following chemotherapy.  2. The metastatic lymph node in the right axilla has decreased in size.  3.  No MRI evidence of left breast malignancy.   09/14/2018 Surgery   RIGHT NIPPLE SPARING MASTECTOMY WITH TARGETED DEEP RIGHT AXILLARY LYMPH NODE BIOPSY WITH RADIOACTIVE SEED LOCALIZATION AND RIGHT AXILLARY SENTINEL LYMPH NODE BIOPSY by Dr. Dalbert Batman  09/14/18    09/14/2018 Pathology Results   Diagnosis 1. Lymph node, sentinel, biopsy, right axillary with radioactive seed - ONE OF ONE LYMPH NODES NEGATIVE FOR CARCINOMA (0/1). - MILD TREATMENT EFFECT. - BIOPSY SITE. 2. Lymph node, sentinel, biopsy, right axillary - ONE OF ONE LYMPH NODES NEGATIVE FOR CARCINOMA (0/1). 3. Lymph node, sentinel, biopsy, right - ONE OF ONE LYMPH NODES NEGATIVE FOR CARCINOMA (0/1). 4. Lymph node, sentinel, biopsy, right - ONE OF ONE LYMPH NODES NEGATIVE FOR CARCINOMA (0/1). 5. Lymph node, sentinel, biopsy, right - ONE OF  ONE LYMPH NODES NEGATIVE FOR CARCINOMA (0/1). 6. Lymph node, sentinel, biopsy, right - ONE OF ONE LYMPH NODES NEGATIVE FOR  CARCINOMA (0/1). 7. Lymph node, sentinel, biopsy, right - ONE OF ONE LYMPH NODES NEGATIVE FOR CARCINOMA (0/1). 8. Lymph node, sentinel, biopsy, right - ONE OF ONE LYMPH NODES NEGATIVE FOR CARCINOMA (0/1). 9. Lymph node, sentinel, biopsy, right - ONE OF ONE LYMPH NODES NEGATIVE FOR CARCINOMA (0/1). 10. Lymph node, sentinel, biopsy, right - ONE OF ONE LYMPH NODES NEGATIVE FOR CARCINOMA (0/1). 11. Lymph node, sentinel, biopsy, right - ONE OF ONE LYMPH NODES NEGATIVE FOR CARCINOMA (0/1). 12. Lymph node, sentinel, biopsy, right - ONE OF ONE LYMPH NODES NEGATIVE FOR CARCINOMA (0/1). 13. Nipple Biopsy, right - BENIGN NIPPLE TISSUE. 14. Breast, simple mastectomy, right - NO RESIDUAL CARCINOMA IDENTIFIED. - TREATMENT EFFECT. - FIBROCYSTIC CHANGE. - ONE OF ONE LYMPH NODES NEGATIVE FOR CARCINOMA (0/1). - SEE ONCOLOGY TABLE.   09/14/2018 Cancer Staging   Staging form: Breast, AJCC 8th Edition - Pathologic stage from 09/14/2018: No Stage Recommended (ypT0, pN0, cM0, GX, ER: Not Assessed, PR: Not Assessed, HER2: Not Assessed) - Signed by Truitt Merle, MD on 09/23/2018   11/03/2018 - 12/10/2018 Radiation Therapy   Adjuvant Radiation with Dr. Isidore Moos    12/27/2018 -  Anti-estrogen oral therapy   Start Tamoxifen 102m daily in 12/27/2018 stopped after 1 week due to poor toleration. She started anastrozole on 02/04/19 but stopped on 02/14/19 due to worsening muscle aches. She tried Letrozole but did not tolerate. She was switched to Exemestane in late 04/2019.    01/04/2019 Imaging   CT chest  IMPRESSION: 1. Stable tiny subpleural and perifissural nodules bilaterally. No suspicious pulmonary nodule or mass. 2. No focal airspace disease in the right lung. 3.  Aortic Atherosclerois (ICD10-170.0)     03/08/2019 Imaging   CT AP W Contrast 03/08/19  IMPRESSION: 1. No metastatic disease in the abdomen pelvis. 2. No evidence adverse immunotherapy therapy reaction.   03/08/2019 Imaging   Whole body bone scan 03/08/19   IMPRESSION: No definite scintigraphic evidence of osseous metastases.   08/29/2019 - 08/2020 Chemotherapy   Started Nerlynx 2438mdaily with budesonide on 08/29/19. Reduced to 20052mn late 08/2019. Plan to complete in 08/2020      CURRENT THERAPY:  -Tamoxifen 22m56mlystarting6/1/2020stopped after 1 week due to poor toleration.She started anastrozole on 02/04/19 but stopped on 02/14/19 due to worsening muscle aches.She tried Letrozole but did not tolerate.She was switched to Exemestane in late10/2020.  INTERVAL HISTORY: *** Claire Elsterhere for a follow up. He presents to the clinic alone.    REVIEW OF SYSTEMS:  *** Constitutional: Denies fevers, chills or abnormal weight loss Eyes: Denies blurriness of vision Ears, nose, mouth, throat, and face: Denies mucositis or sore throat Respiratory: Denies cough, dyspnea or wheezes Cardiovascular: Denies palpitation, chest discomfort or lower extremity swelling Gastrointestinal:  Denies nausea, heartburn or change in bowel habits Skin: Denies abnormal skin rashes Lymphatics: Denies new lymphadenopathy or easy bruising Neurological:Denies numbness, tingling or new weaknesses Behavioral/Psych: Mood is stable, no new changes  All other systems were reviewed with the patient and are negative.  MEDICAL HISTORY:  Past Medical History:  Diagnosis Date  . Abdominal pain, unspecified site   . Anxiety state, unspecified   . Complication of anesthesia    convulsions - when she has appendectomy about 12 years ago at [facility]  . Contact dermatitis and other eczema, due to unspecified cause   . Essential hypertension,  benign   . Family history of colon cancer   . Family history of melanoma   . Family history of prostate cancer   . GERD (gastroesophageal reflux disease)   . Hematuria, unspecified   . Hypertension   . Lower back pain   . Mixed hyperlipidemia   . Rosacea   . rt breast ca dx'd 01/2018   R breast cancer  .  Unspecified symptom associated with female genital organs     SURGICAL HISTORY: Past Surgical History:  Procedure Laterality Date  . BREAST RECONSTRUCTION WITH PLACEMENT OF TISSUE EXPANDER AND ALLODERM Right 09/14/2018   Procedure: BREAST RECONSTRUCTION WITH PLACEMENT OF TISSUE EXPANDER AND ALLODERM;  Surgeon: Irene Limbo, MD;  Location: [place];  Service: Plastics;  Laterality: Right;  . CESAREAN SECTION    . DIAGNOSTIC LAPAROSCOPY    . EYE SURGERY     Lasik  . LAPAROSCOPIC APPENDECTOMY    . LIPOSUCTION WITH LIPOFILLING Right 07/26/2019   Procedure: LIPOFILLING from abdomen to R chest;  Surgeon: Irene Limbo, MD;  Location: [place];  Service: Plastics;  Laterality: Right;  . MASTECTOMY WITH RADIOACTIVE SEED GUIDED EXCISION AND AXILLARY SENTINEL LYMPH NODE BIOPSY Right 09/14/2018   Procedure: RIGHT NIPPLE SPARING MASTECTOMY WITH TARGETED DEEP RIGHT AXILLARY LYMPH NODE BIOPSY WITH RADIOACTIVE SEED LOCALIZATION AND RIGHT AXILLARY SENTINEL LYMPH NODE BIOPSY, FROZEN SECTION AND POSSIBLE COMPLETION AXILLARY LYMPH NODE DISSECTION INJECT BLUE DYE RIGHT BREAST;  Surgeon: Fanny Skates, MD;  Location: [place];  Service: General;  Laterality: Right;  . PORT-A-CATH REMOVAL Left 07/26/2019   Procedure: REMOVAL PORT-A-CATH;  Surgeon: Irene Limbo, MD;  Location: [place];  Service: Plastics;  Laterality: Left;  . PORTACATH PLACEMENT Left 04/13/2018   Procedure: INSERTION PORT-A-CATH WITH [place];  Surgeon: Fanny Skates, MD;  Location: [place];  Service: General;  Laterality: Left;  . REMOVAL OF TISSUE EXPANDER AND PLACEMENT OF IMPLANT Right 07/26/2019   Procedure: REMOVAL OF TISSUE EXPANDER AND PLACEMENT OF IMPLANT;  Surgeon: Irene Limbo, MD;  Location: [place];  Service: Plastics;  Laterality: Right;  . UTERINE FIBROID SURGERY      I have reviewed the social history and family history with the patient and they are  unchanged from previous note.  ALLERGIES:  is allergic to metrogel [metronidazole], other, paxil [paroxetine hcl], prednisone, sulfamethoxazole, and zomig [zolmitriptan].  MEDICATIONS:  Current Outpatient Medications  Medication Sig Dispense Refill  . ALPRAZolam (XANAX) 0.5 MG tablet Take 1 tablet (0.5 mg total) by mouth at bedtime as needed for anxiety or sleep. 30 tablet 0  . Ascorbic Acid (VITAMIN C) 1000 MG tablet Take 2,000 mg by mouth daily.    Marland Kitchen atenolol (TENORMIN) 50 MG tablet Take 50 mg by mouth at bedtime.    Marland Kitchen azithromycin (ZITHROMAX) 250 MG tablet Take by mouth.    . Budesonide ER (ORTIKOS) 9 MG CP24 Take 1 capsule by mouth daily. 30 capsule 2  . cephALEXin (KEFLEX) 500 MG capsule Take 1 capsule (500 mg total) by mouth 3 (three) times daily. 21 capsule 0  . Cholecalciferol (VITAMIN D3) 50 MCG (2000 UT) capsule Take 2,000 Units by mouth daily.    . cyclobenzaprine (FLEXERIL) 5 MG tablet Take 1 tablet (5 mg total) by mouth 3 (three) times daily as needed for muscle spasms. 30 tablet 0  . diclofenac (VOLTAREN) 75 MG EC tablet Take 1 tablet (75 mg total) by mouth 2 (two) times daily. 50 tablet 2  . exemestane (AROMASIN)  25 MG tablet Take 1 tablet (25 mg total) by mouth daily after breakfast. 90 tablet 1  . ibuprofen (ADVIL) 400 MG tablet Take 400 mg by mouth 3 (three) times daily as needed.    . meloxicam (MOBIC) 15 MG tablet Take 1 tablet (15 mg total) by mouth daily. 30 tablet 2  . Neratinib Maleate (NERLYNX) 40 MG tablet TAKE 6 TABLETS (240 MG TOTAL) BY MOUTH DAILY. 180 tablet 3  . omeprazole (PRILOSEC) 20 MG capsule Take 1 capsule (20 mg total) by mouth 2 (two) times daily before a meal. (Patient not taking: Reported on 01/03/2020) 60 capsule 2  . potassium chloride SA (KLOR-CON) 20 MEQ tablet TAKE 1 TABLET BY MOUTH EVERY DAY 30 tablet 3  . traMADol (ULTRAM) 50 MG tablet Take 1 tablet twice daily as needed for severe pain (Patient not taking: Reported on 01/03/2020) 30 tablet 0  .  valACYclovir (VALTREX) 500 MG tablet valacyclovir 500 mg tablet  TAKE 1 TABLET BY MOUTH TWICE DAILY    . venlafaxine XR (EFFEXOR XR) 150 MG 24 hr capsule Take 1 capsule (150 mg total) by mouth daily with breakfast. 30 capsule 5  . Zinc 50 MG TABS Take 1 tablet by mouth daily.     No current facility-administered medications for this visit.    PHYSICAL EXAMINATION: ECOG PERFORMANCE STATUS: {CHL ONC ECOG PS:[phone]}  There were no vitals filed for this visit. There were no vitals filed for this visit. *** GENERAL:alert, no distress and comfortable SKIN: skin color, texture, turgor are normal, no rashes or significant lesions EYES: normal, Conjunctiva are pink and non-injected, sclera clear {OROPHARYNX:no exudate, no erythema and lips, buccal mucosa, and tongue normal}  NECK: supple, thyroid normal size, non-tender, without nodularity LYMPH:  no palpable lymphadenopathy in the cervical, axillary {or inguinal} LUNGS: clear to auscultation and percussion with normal breathing effort HEART: regular rate & rhythm and no murmurs and no lower extremity edema ABDOMEN:abdomen soft, non-tender and normal bowel sounds Musculoskeletal:no cyanosis of digits and no clubbing  NEURO: alert & oriented x 3 with fluent speech, no focal motor/sensory deficits  LABORATORY DATA:  I have reviewed the data as listed CBC Latest Ref Rng & Units 05/04/2020 03/02/2020 12/30/2019  WBC 4.0 - 10.5 K/uL 6.0 6.1 5.0  Hemoglobin 12.0 - 15.0 g/dL 13.6 13.2 12.3  Hematocrit 36.0 - 46.0 % 37.8 36.9 34.5(L)  Platelets 150 - 400 K/uL 178 201 167     CMP Latest Ref Rng & Units 07/04/2020 05/04/2020 03/02/2020  Glucose 70 - 99 mg/dL 103(H) 91 107(H)  BUN 6 - 20 mg/dL _0 Creatinine 0.44 - 1.00 mg/dL 0.77 0.77 0.73  Sodium 135 - 145 mmol/L 143 144 141  Potassium 3.5 - 5.1 mmol/L 3.8 3.1(L) 3.2(L)  Chloride 98 - 111 mmol/L 107 105 107  CO2 22 - 32 mmol/L 28 33(H) 23  Calcium 8.9 - 10.3 mg/dL 9.4 9.8 9.8  Total  Protein 6.5 - 8.1 g/dL 7.8 7.6 7.7  Total Bilirubin 0.3 - 1.2 mg/dL 0.7 0.8 0.8  Alkaline Phos 38 - 126 U/L 98 96 124  AST 15 - 41 U/L _1 ALT 0 - 44 U/L _2 RADIOGRAPHIC STUDIES: I have personally reviewed the radiological images as listed and agreed with the findings in the report. US BREAST LTD UNI RIGHT INC AXILLA  Result Date: 09/13/2020 CLINICAL DATA:  54 year old female presenting with a new lump in the right breast. Patient  has history of right breast cancer in 2020 status post mastectomy with implant reconstruction. EXAM: ULTRASOUND OF THE RIGHT BREAST COMPARISON:  Previous exam(s). FINDINGS: On physical exam, I palpate a small superficial nodule at the site of concern reported by the patient in the upper slightly outer right breast. Targeted ultrasound is performed at the palpable site of concern in the right breast at 11 o'clock 6 cm from the nipple demonstrating an oval circumscribed anechoic mass measuring 0.4 x 0.3 x 0.4 cm, likely an oil cyst. There are several additional similar appearing masses at 11 o'clock 7 cm from the nipple. No internal vascularity. No suspicious solid mass. IMPRESSION: At the palpable site of concern in the right breast at 11 o'clock 6 cm from the nipple there is a benign cyst, likely an oil cyst. No sonographic evidence of malignancy. RECOMMENDATION: Return for routine annual screening mammography which will be due in October 2022. I have discussed the findings and recommendations with the patient. If applicable, a reminder letter will be sent to the patient regarding the next appointment. BI-RADS CATEGORY  2: Benign. Electronically Signed   By: Audie Pinto M.D.   On: 09/13/2020 13:09     ASSESSMENT & PLAN:  Claire Guzman is a 55 y.o. female with    1. Right breast lump  -There is a pea-sized hard nodule at the edge of the implant in the right upper quadrant of right breast, slightly tender, no signs of inflammation on  exam. -Her 09/13/20 US showed *** -We will discuss with her plastic surgeon Dr. Iran Planas   1. Malignant neoplasm of upper-inner quadrant of right breast in female, invasive ductal carcinoma, cT2N1M0 stage IB, ER+/PR+/HER2+, G3, ypT0N0 -She was diagnosed in 02/2018. She is s/pneoadjuvantchemoof 6 cyclesTCHP,right breast mastectomyandaxillary lymph node dissectionon 2/18/20and adjuvant radiation.She completed 1 year maintenance Herceptin/Perjeta in 03/2019. -She started antiestrogen therapy in 12/2018. She did not tolerate tamoxifen, Anastrozole or Letrozole. She is currently on Exemestane since 10/2020tolerating much better. -Sheunderwent reconstruction surgery and PAC removal byDr. Iran Planas on 07/26/19. -She completed 1 year of anti-HER2 treatment with oral Nerlynx 08/29/19-08/29/20.  *** -Continue surveillance. Next mammogram in 04/2021. She is also interested in MRI screening for beast cancer, will start MRIs in 10/2020 if her insurance approves.  -Continue Exemestane -F/u in 3 months then every 6 months.    2.Anxiety, OCD,insomnia -She notes she has been more anxious lately with increased OCD and family stress.  -She is on Xanax 0.37m, about4-5 night a week to help her sleep. She is onEffexor154mnce daily. -She continues to have mood swings occasionally.  3. Osteoporosis  -Her DEXA from 07/2019 showed lowest T-score -2.7 at left total femur.  -I discussed she is on Exemestane which can lower her bone density.  -ContinueCalcium and Vit D once daily. -To strengthen her bone and reduce risk of bone mets she started Zometa q6m34monthor 2 years on 03/02/20.Next dose in 09/2020.  {4. Diarrhea, Hypokalemia  -She has diarrhea secondary to Nerlynx. -Diarrhea has recently improved, now intermittent. She uses imodium as needed and continues budesonide and 200m60mse Nerlynx -She has Hypokalemia and hypomagnesium secondary to her diarrhea.On oral potassium TID (not  consistently) and mag.  -Mag normal lately. }  5. Vaginal pain  -She has vaginal pain with sexual intercourse  -I discussed this is likely from antiestrogen therapy. I recommend she avoid estrogen containing medications or creams.  -I encouraged her to speak with her Gyn about possible Monalisa procedure for vaginal dilitation.  Plan: -Continue Exemestane  -Lab, f/u and Zometa in 3 months    No problem-specific Assessment & Plan notes found for this encounter.   No orders of the defined types were placed in this encounter.  All questions were answered. The patient knows to call the clinic with any problems, questions or concerns. No barriers to learning was detected. The total time spent in the appointment was {CHL ONC TIME VISIT - IOXBD:[id number]}.     Joslyn Devon 09/14/2020   Oneal Deputy, am acting as scribe for Truitt Merle, MD.   {Add scribe attestation statement}

## 2020-09-17 ENCOUNTER — Inpatient Hospital Stay (HOSPITAL_BASED_OUTPATIENT_CLINIC_OR_DEPARTMENT_OTHER): Payer: Medicaid Other

## 2020-09-17 ENCOUNTER — Inpatient Hospital Stay: Payer: Medicaid Other | Admitting: Hematology

## 2020-09-17 ENCOUNTER — Other Ambulatory Visit: Payer: Self-pay

## 2020-09-17 VITALS — BP 145/62 | HR 73 | Temp 98.1°F | Resp 18 | Wt 165.5 lb

## 2020-09-17 DIAGNOSIS — Z170 Estrogen receptor positive status [ER+]: Secondary | ICD-10-CM

## 2020-09-17 DIAGNOSIS — Z95828 Presence of other vascular implants and grafts: Secondary | ICD-10-CM

## 2020-09-17 DIAGNOSIS — C50211 Malignant neoplasm of upper-inner quadrant of right female breast: Secondary | ICD-10-CM

## 2020-09-17 MED ORDER — SODIUM CHLORIDE 0.9 % IV SOLN
Freq: Once | INTRAVENOUS | Status: AC
  Filled 2020-09-17: qty 250

## 2020-09-17 MED ORDER — ZOLEDRONIC ACID 4 MG/100ML IV SOLN: 4.0000 mg | Freq: Once | INTRAVENOUS | Status: DC

## 2020-09-17 MED ORDER — ZOLEDRONIC ACID 4 MG/100ML IV SOLN
INTRAVENOUS | Status: AC
  Filled 2020-09-17: qty 100

## 2020-09-17 MED ORDER — ZOLEDRONIC ACID 4 MG/100ML IV SOLN
4.0000 mg | Freq: Once | INTRAVENOUS | Status: AC
  Administered 2020-09-17: 4 mg via INTRAVENOUS

## 2020-09-17 NOTE — Patient Instructions (Signed)
Zoledronic Acid injection (Hypercalcemia, Oncology) What is this medicine? ZOLEDRONIC ACID (ZOE le dron ik AS id) lowers the amount of calcium loss from bone. It is used to treat too much calcium in your blood from cancer. It is also used to prevent complications of cancer that has spread to the bone. This medicine may be used for other purposes; ask your health care provider or pharmacist if you have questions. COMMON BRAND NAME(S): Zometa What should I tell my health care provider before I take this medicine? They need to know if you have any of these conditions:  aspirin-sensitive asthma  cancer, especially if you are receiving medicines used to treat cancer  dental disease or wear dentures  infection  kidney disease  receiving corticosteroids like dexamethasone or prednisone  an unusual or allergic reaction to zoledronic acid, other medicines, foods, dyes, or preservatives  pregnant or trying to get pregnant  breast-feeding How should I use this medicine? This medicine is for infusion into a vein. It is given by a health care professional in a hospital or clinic setting. Talk to your pediatrician regarding the use of this medicine in children. Special care may be needed. Overdosage: If you think you have taken too much of this medicine contact a poison control center or emergency room at once. NOTE: This medicine is only for you. Do not share this medicine with others. What if I miss a dose? It is important not to miss your dose. Call your doctor or health care professional if you are unable to keep an appointment. What may interact with this medicine?  certain antibiotics given by injection  NSAIDs, medicines for pain and inflammation, like ibuprofen or naproxen  some diuretics like bumetanide, furosemide  teriparatide  thalidomide This list may not describe all possible interactions. Give your health care provider a list of all the medicines, herbs, non-prescription  drugs, or dietary supplements you use. Also tell them if you smoke, drink alcohol, or use illegal drugs. Some items may interact with your medicine. What should I watch for while using this medicine? Visit your doctor or health care professional for regular checkups. It may be some time before you see the benefit from this medicine. Do not stop taking your medicine unless your doctor tells you to. Your doctor may order blood tests or other tests to see how you are doing. Women should inform their doctor if they wish to become pregnant or think they might be pregnant. There is a potential for serious side effects to an unborn child. Talk to your health care professional or pharmacist for more information. You should make sure that you get enough calcium and vitamin D while you are taking this medicine. Discuss the foods you eat and the vitamins you take with your health care professional. Some people who take this medicine have severe bone, joint, and/or muscle pain. This medicine may also increase your risk for jaw problems or a broken thigh bone. Tell your doctor right away if you have severe pain in your jaw, bones, joints, or muscles. Tell your doctor if you have any pain that does not go away or that gets worse. Tell your dentist and dental surgeon that you are taking this medicine. You should not have major dental surgery while on this medicine. See your dentist to have a dental exam and fix any dental problems before starting this medicine. Take good care of your teeth while on this medicine. Make sure you see your dentist for regular follow-up   appointments. What side effects may I notice from receiving this medicine? Side effects that you should report to your doctor or health care professional as soon as possible:  allergic reactions like skin rash, itching or hives, swelling of the face, lips, or tongue  anxiety, confusion, or depression  breathing problems  changes in vision  eye  pain  feeling faint or lightheaded, falls  jaw pain, especially after dental work  mouth sores  muscle cramps, stiffness, or weakness  redness, blistering, peeling or loosening of the skin, including inside the mouth  trouble passing urine or change in the amount of urine Side effects that usually do not require medical attention (report to your doctor or health care professional if they continue or are bothersome):  bone, joint, or muscle pain  constipation  diarrhea  fever  hair loss  irritation at site where injected  loss of appetite  nausea, vomiting  stomach upset  trouble sleeping  trouble swallowing  weak or tired This list may not describe all possible side effects. Call your doctor for medical advice about side effects. You may report side effects to FDA at 1-800-FDA-1088. Where should I keep my medicine? This drug is given in a hospital or clinic and will not be stored at home. NOTE: This sheet is a summary. It may not cover all possible information. If you have questions about this medicine, talk to your doctor, pharmacist, or health care provider.  2020 [employer] (2013-12-10 14:19:39)  

## 2020-09-18 ENCOUNTER — Other Ambulatory Visit: Payer: Medicaid Other

## 2020-10-01 ENCOUNTER — Other Ambulatory Visit: Payer: Self-pay

## 2020-10-01 ENCOUNTER — Encounter: Payer: Self-pay | Admitting: Podiatry

## 2020-10-01 ENCOUNTER — Ambulatory Visit (INDEPENDENT_AMBULATORY_CARE_PROVIDER_SITE_OTHER): Payer: Medicaid Other

## 2020-10-01 ENCOUNTER — Ambulatory Visit (INDEPENDENT_AMBULATORY_CARE_PROVIDER_SITE_OTHER): Payer: Medicaid Other | Admitting: Podiatry

## 2020-10-01 DIAGNOSIS — M25572 Pain in left ankle and joints of left foot: Secondary | ICD-10-CM

## 2020-10-01 DIAGNOSIS — M722 Plantar fascial fibromatosis: Secondary | ICD-10-CM | POA: Diagnosis not present

## 2020-10-01 DIAGNOSIS — M7752 Other enthesopathy of left foot: Secondary | ICD-10-CM | POA: Diagnosis not present

## 2020-10-01 DIAGNOSIS — M779 Enthesopathy, unspecified: Secondary | ICD-10-CM

## 2020-10-01 MED ORDER — TRIAMCINOLONE ACETONIDE 10 MG/ML IJ SUSP
10.0000 mg | Freq: Once | INTRAMUSCULAR | Status: AC
  Administered 2020-10-01: 10 mg

## 2020-10-03 ENCOUNTER — Other Ambulatory Visit: Payer: Medicaid Other

## 2020-10-03 ENCOUNTER — Ambulatory Visit: Payer: Medicaid Other | Admitting: Hematology

## 2020-10-03 ENCOUNTER — Ambulatory Visit: Payer: Medicaid Other

## 2020-10-03 NOTE — Progress Notes (Signed)
Subjective:   Patient ID: Claire Guzman, female   DOB: 55 y.o.   MRN: [id number]   HPI Patient presents with a lot of pain plantar aspect right heel and pain in the left ankle stating this is all started recently   ROS      Objective:  Physical Exam  Neurovascular status unchanged with exquisite discomfort left sinus tarsi and plantar fascial right     Assessment:  Sinus tarsitis left with capsulitis along with plantar fasciitis right     Plan:  H&P reviewed both conditions sterile prep left injected the sinus tarsi 3 mg Dexasone Kenalog 5 mg Xylocaine and for the right injected the plantar fascia after sterile prep 3 mg Kenalog 5 mg Xylocaine see back as needed

## 2020-10-08 ENCOUNTER — Other Ambulatory Visit: Payer: Self-pay | Admitting: Hematology

## 2020-10-08 DIAGNOSIS — F419 Anxiety disorder, unspecified: Secondary | ICD-10-CM

## 2020-10-09 ENCOUNTER — Other Ambulatory Visit: Payer: Self-pay | Admitting: Hematology

## 2020-10-09 DIAGNOSIS — F419 Anxiety disorder, unspecified: Secondary | ICD-10-CM

## 2020-10-09 MED ORDER — ALPRAZOLAM 0.5 MG PO TABS: 0.5000 mg | ORAL_TABLET | Freq: Every evening | ORAL | 0 refills | Status: DC | PRN

## 2020-10-10 ENCOUNTER — Other Ambulatory Visit: Payer: Self-pay | Admitting: Hematology

## 2020-10-10 DIAGNOSIS — Z170 Estrogen receptor positive status [ER+]: Secondary | ICD-10-CM

## 2020-10-10 DIAGNOSIS — F419 Anxiety disorder, unspecified: Secondary | ICD-10-CM

## 2020-10-10 DIAGNOSIS — C50211 Malignant neoplasm of upper-inner quadrant of right female breast: Secondary | ICD-10-CM

## 2020-10-15 ENCOUNTER — Ambulatory Visit: Payer: Medicaid Other

## 2020-10-30 ENCOUNTER — Encounter: Payer: Self-pay | Admitting: Emergency Medicine

## 2020-10-30 ENCOUNTER — Other Ambulatory Visit: Payer: Self-pay

## 2020-10-30 ENCOUNTER — Emergency Department (INDEPENDENT_AMBULATORY_CARE_PROVIDER_SITE_OTHER): Payer: Medicaid Other

## 2020-10-30 ENCOUNTER — Emergency Department
Admission: EM | Admit: 2020-10-30 | Discharge: 2020-10-30 | Disposition: A | Discharge status: Home / Self Care | Payer: Medicaid Other | Source: Home / Self Care | Attending: Family Medicine | Admitting: Family Medicine

## 2020-10-30 DIAGNOSIS — R0781 Pleurodynia: Secondary | ICD-10-CM | POA: Diagnosis not present

## 2020-10-30 LAB — POCT URINALYSIS DIP (MANUAL ENTRY)
Bilirubin, UA: NEGATIVE
Glucose, UA: NEGATIVE mg/dL
Ketones, POC UA: NEGATIVE mg/dL
Leukocytes, UA: NEGATIVE
Nitrite, UA: NEGATIVE
Protein Ur, POC: NEGATIVE mg/dL
Spec Grav, UA: 1.01 (ref 1.010–1.025)
Urobilinogen, UA: 0.2 E.U./dL
pH, UA: 6 (ref 5.0–8.0)

## 2020-10-30 MED ORDER — MELOXICAM 15 MG PO TABS: 15.0000 mg | ORAL_TABLET | Freq: Every day | ORAL | 0 refills | Status: DC

## 2020-10-30 MED ORDER — HYDROCODONE-ACETAMINOPHEN 5-325 MG PO TABS: 1.0000 | ORAL_TABLET | Freq: Four times a day (QID) | ORAL | 0 refills | Status: DC | PRN

## 2020-10-30 NOTE — ED Provider Notes (Signed)
[facility]    CSN: [id number] Arrival date & time: 10/30/20  1020      History   Chief Complaint Chief Complaint  Patient presents with  . Flank Pain    right    HPI Claire Guzman is a 55 y.o. female.   HPI   She is here for flank pain.  It is left greater than right.  It is right at the lower rib border on both sides.  Worse with a deep breath.  Worse with movement.  She states is been present for about a week but then it is getting worse today.  She states that the pain is better in the morning and worse in the evening.  She has not had any accident or injury but has not had any heavy lifting.  Has not had a fall or trauma.  She has not had problems with her mid back before although she has had neck problems.  She has no urinary symptoms, dysuria, or frequency.  No history of kidney stones or kidney infection.  No fever or chills.  No nausea or vomiting.  Patient does have a history of breast cancer 2 to 3 years ago.  She is done well with her treatment.  She does continue to have a fear of the cancer recurrence.  This new pain has her concerned.  She has not had any recurrence of the cancer  Past Medical History:  Diagnosis Date  . Abdominal pain, unspecified site   . Anxiety state, unspecified   . Complication of anesthesia    convulsions - when she has appendectomy about 12 years ago at [facility]  . Contact dermatitis and other eczema, due to unspecified cause   . Essential hypertension, benign   . Family history of colon cancer   . Family history of melanoma   . Family history of prostate cancer   . GERD (gastroesophageal reflux disease)   . Hematuria, unspecified   . Hypertension   . Lower back pain   . Mixed hyperlipidemia   . Rosacea   . rt breast ca dx'd 01/2018   R breast cancer  . Unspecified symptom associated with female genital organs     Patient Active Problem List   Diagnosis Date Noted  . Acquired absence of right breast  09/22/2018  . Port-A-Cath in place 05/11/2018  . Genetic testing 04/12/2018  . Family history of prostate cancer   . Family history of colon cancer   . Family history of melanoma   . Malignant neoplasm of upper-inner quadrant of right breast in female, estrogen receptor positive ([place]) 03/10/2018    Past Surgical History:  Procedure Laterality Date  . BREAST RECONSTRUCTION WITH PLACEMENT OF TISSUE EXPANDER AND ALLODERM Right 09/14/2018   Procedure: BREAST RECONSTRUCTION WITH PLACEMENT OF TISSUE EXPANDER AND ALLODERM;  Surgeon: Irene Limbo, MD;  Location: [facility];  Service: Plastics;  Laterality: Right;  . CESAREAN SECTION    . DIAGNOSTIC LAPAROSCOPY    . EYE SURGERY     Lasik  . LAPAROSCOPIC APPENDECTOMY    . LIPOSUCTION WITH LIPOFILLING Right 07/26/2019   Procedure: LIPOFILLING from abdomen to R chest;  Surgeon: Irene Limbo, MD;  Location: [place];  Service: Plastics;  Laterality: Right;  . MASTECTOMY WITH RADIOACTIVE SEED GUIDED EXCISION AND AXILLARY SENTINEL LYMPH NODE BIOPSY Right 09/14/2018   Procedure: RIGHT NIPPLE SPARING MASTECTOMY WITH TARGETED DEEP RIGHT AXILLARY LYMPH NODE BIOPSY WITH RADIOACTIVE SEED LOCALIZATION AND RIGHT AXILLARY SENTINEL  LYMPH NODE BIOPSY, FROZEN SECTION AND POSSIBLE COMPLETION AXILLARY LYMPH NODE DISSECTION INJECT BLUE DYE RIGHT BREAST;  Surgeon: Fanny Skates, MD;  Location: [facility];  Service: General;  Laterality: Right;  . PORT-A-CATH REMOVAL Left 07/26/2019   Procedure: REMOVAL PORT-A-CATH;  Surgeon: Irene Limbo, MD;  Location: [place];  Service: Plastics;  Laterality: Left;  . PORTACATH PLACEMENT Left 04/13/2018   Procedure: INSERTION PORT-A-CATH WITH [place];  Surgeon: Fanny Skates, MD;  Location: [name];  Service: General;  Laterality: Left;  . REMOVAL OF TISSUE EXPANDER AND PLACEMENT OF IMPLANT Right 07/26/2019   Procedure: REMOVAL OF TISSUE EXPANDER AND PLACEMENT OF IMPLANT;  Surgeon:  Irene Limbo, MD;  Location: [place];  Service: Plastics;  Laterality: Right;  . UTERINE FIBROID SURGERY      OB History   No obstetric history on file.      Home Medications    Prior to Admission medications   Medication Sig Start Date End Date Taking? Authorizing Provider  ALPRAZolam Duanne Moron) 0.5 MG tablet Take 1 tablet (0.5 mg total) by mouth at bedtime as needed for anxiety or sleep. 10/09/20  Yes Truitt Merle, MD  atenolol (TENORMIN) 50 MG tablet Take 50 mg by mouth at bedtime.   Yes [provider]  Cholecalciferol (VITAMIN D3) 50 MCG (2000 UT) capsule Take 2,000 Units by mouth daily. 05/13/20  Yes [provider]  exemestane (AROMASIN) 25 MG tablet Take 1 tablet (25 mg total) by mouth daily after breakfast. 07/04/20  Yes Truitt Merle, MD  HYDROcodone-acetaminophen (NORCO/VICODIN) 5-325 MG tablet Take 1-2 tablets by mouth every 6 (six) hours as needed. 10/30/20  Yes Raylene Everts, MD  ibuprofen (ADVIL) 400 MG tablet Take 400 mg by mouth 3 (three) times daily as needed. 05/13/20  Yes [provider]  meloxicam (MOBIC) 15 MG tablet Take 1 tablet (15 mg total) by mouth daily. Take with food 10/30/20  Yes Raylene Everts, MD  potassium chloride SA (KLOR-CON) 20 MEQ tablet TAKE 1 TABLET BY MOUTH EVERY DAY 08/23/20  Yes Truitt Merle, MD  Ascorbic Acid (VITAMIN C) 1000 MG tablet Take 2,000 mg by mouth daily. 05/13/20   [provider]  valACYclovir (VALTREX) 500 MG tablet valacyclovir 500 mg tablet  TAKE 1 TABLET BY MOUTH TWICE DAILY    [provider]  venlafaxine XR (EFFEXOR-XR) 150 MG 24 hr capsule TAKE 1 CAPSULE(150 MG) BY MOUTH DAILY WITH BREAKFAST 10/11/20   Truitt Merle, MD  Zinc 50 MG TABS Take 1 tablet by mouth daily. 05/13/20   [provider]  omeprazole (PRILOSEC) 20 MG capsule Take 1 capsule (20 mg total) by mouth 2 (two) times daily before a meal. Patient not taking: Reported on 01/03/2020 05/03/18 10/30/20  Truitt Merle, MD  prochlorperazine (COMPAZINE) 10 MG tablet TAKE 1 TABLET BY MOUTH EVERY 6 HOURS AS NEEDED FOR NAUSEA OR VOMITING 08/10/18 08/12/18  Truitt Merle, MD    Family History Family History  Problem Relation Age of Onset  . Heart failure Mother   . Melanoma Mother 86       has had several removed over the past ten years  . Prostate cancer Father 27  . Stroke Father   . Heart attack Maternal Grandmother   . Heart attack Maternal Grandfather        possible cancer, unknown type  . Stroke Paternal Grandmother   . Colon cancer Paternal Grandfather        dx upper 54s  .  Hypertension Other   . Heart Problems Maternal Aunt        d.60  . Heart Problems Maternal Uncle        d.60  . Colon cancer Paternal Aunt        dx 68s  . Colon cancer Paternal Uncle        dx 6s    Social History Social History   Tobacco Use  . Smoking status: Never Smoker  . Smokeless tobacco: Never Used  Vaping Use  . Vaping Use: Never used  Substance Use Topics  . Alcohol use: No  . Drug use: Never     Allergies   Metrogel [metronidazole], Other, Paxil [paroxetine hcl], Sulfamethoxazole, Zomig [zolmitriptan], and Prednisone   Review of Systems Review of Systems See HPI  Physical Exam Triage Vital Signs ED Triage Vitals  Enc Vitals Group     BP 10/30/20 1120 (!) 142/85     Pulse Rate 10/30/20 1120 76     Resp 10/30/20 1120 16     Temp 10/30/20 1120 98.6 F (37 C)     Temp Source 10/30/20 1120 Oral     SpO2 10/30/20 1120 99 %     Weight --      Height --      Head Circumference --      Peak Flow --      Pain Score 10/30/20 1123 4     Pain Loc --      Pain Edu? --      Excl. in [place]? --    No data found.  Updated Vital Signs BP (!) 142/85 (BP Location: Right Arm)   Pulse 76   Temp 98.6 F (37 C) (Oral)   Resp 16   LMP 03/28/2018 (Within Days)   SpO2 99%     Physical Exam Constitutional:      General: She is not in acute distress.    Appearance: Normal appearance. She is  well-developed.  HENT:     Head: Normocephalic and atraumatic.     Mouth/Throat:     Comments: Mask is in place Eyes:     Conjunctiva/sclera: Conjunctivae normal.     Pupils: Pupils are equal, round, and reactive to light.  Cardiovascular:     Rate and Rhythm: Normal rate and regular rhythm.     Heart sounds: Normal heart sounds.  Pulmonary:     Effort: Pulmonary effort is normal. No respiratory distress.     Comments: Mild tenderness to pressure mid axillary line at the lower rib border bilaterally.  No CVA tenderness.  No abdominal tenderness even to deep palpation Chest:     Chest wall: Tenderness present.  Abdominal:     General: There is no distension.     Palpations: Abdomen is soft.  Musculoskeletal:        General: Normal range of motion.     Cervical back: Normal range of motion and neck supple.  Skin:    General: Skin is warm and dry.  Neurological:     Mental Status: She is alert.  Psychiatric:        Behavior: Behavior normal.      UC Treatments / Results  Labs (all labs ordered are listed, but only abnormal results are displayed) Labs Reviewed  POCT URINALYSIS DIP (MANUAL ENTRY) - Abnormal; Notable for the following components:      Result Value   Blood, UA trace-lysed (*)    All other components within normal limits  EKG   Radiology DG Chest 2 View  Result Date: 10/30/2020 : No known injury. EXAM: CHEST - 2 VIEW COMPARISON:  Chest x-ray 02/18/2019.  Chest CT 01/04/2019 FINDINGS: Mediastinum and hilar structures normal. Lungs are clear. No pleural effusion or pneumothorax. Heart size normal. Thoracic spine scoliosis and degenerative change. Surgical clips right upper chest. IMPRESSION: No acute cardiopulmonary disease. Electronically Signed   By: Marcello Moores  Register   On: 10/30/2020 12:42    Procedures Procedures (including critical care time)  Medications Ordered in UC Medications - No data to display  Initial Impression / Assessment and Plan / UC  Course  I have reviewed the triage vital signs and the nursing notes.  Pertinent labs & imaging results that were available during my care of the patient were reviewed by me and considered in my medical decision making (see chart for details).     Reviewed with patient that the rib pain does not appear to be kidney in origin.  I believe it is musculoskeletal rib pain.  No evidence of any bony abnormality noted on x-ray.  She does have osteoporosis.  Treat with pain management and have her follow-up with her primary care if not improving Final Clinical Impressions(s) / UC Diagnoses   Final diagnoses:  Painful rib     Discharge Instructions     Take meloxicam once a day, with food Use warmth or heat to area Limit lifting, pushing and pulling while the pain is present I have prescribed hydrocodone for more severe pain.  Do not drive on hydrocodone.  Caution taking Xanax with hydrocodone Call your primary care doctor if you fail to improve in a week or 2    ED Prescriptions    Medication Sig Dispense Auth. Provider   meloxicam (MOBIC) 15 MG tablet Take 1 tablet (15 mg total) by mouth daily. Take with food 15 tablet Raylene Everts, MD   HYDROcodone-acetaminophen (NORCO/VICODIN) 5-325 MG tablet Take 1-2 tablets by mouth every 6 (six) hours as needed. 10 tablet Raylene Everts, MD     I have reviewed the PDMP during this encounter.   Raylene Everts, MD 10/30/20 [phone]

## 2020-10-30 NOTE — ED Triage Notes (Signed)
Right flank pain - denies dysuria  Pain has been constant - worse in evening  Describes as sore & tight  COVID vaccine/infusion / COVID 04/2019

## 2020-10-30 NOTE — Discharge Instructions (Signed)
Take meloxicam once a day, with food Use warmth or heat to area Limit lifting, pushing and pulling while the pain is present I have prescribed hydrocodone for more severe pain.  Do not drive on hydrocodone.  Caution taking Xanax with hydrocodone Call your primary care doctor if you fail to improve in a week or 2

## 2020-11-04 ENCOUNTER — Other Ambulatory Visit: Payer: Self-pay | Admitting: Hematology

## 2020-11-04 DIAGNOSIS — F419 Anxiety disorder, unspecified: Secondary | ICD-10-CM

## 2020-11-05 ENCOUNTER — Telehealth: Payer: Self-pay

## 2020-11-05 NOTE — Telephone Encounter (Signed)
Claire Guzman called stating that lump at the breast incision site waxes and wanes.  She is requesting a referral to dermatology.

## 2020-11-06 NOTE — Telephone Encounter (Signed)
refill 

## 2020-11-07 ENCOUNTER — Other Ambulatory Visit: Payer: Self-pay

## 2020-11-07 NOTE — Progress Notes (Signed)
error 

## 2020-11-20 ENCOUNTER — Telehealth: Payer: Self-pay

## 2020-11-20 NOTE — Telephone Encounter (Signed)
Claire Guzman called stating that the pain under her right rib that radiates to her back has not gone away.  It comes and goes and is not associated with any particular activity.  She is requesting a "scan".  Reviewed with Claire Rue, NP. Pt is to hold exemestane and move her appts to this week.  I reviewed with Claire Guzman and she verbalized understanding.

## 2020-11-22 ENCOUNTER — Inpatient Hospital Stay (HOSPITAL_BASED_OUTPATIENT_CLINIC_OR_DEPARTMENT_OTHER): Payer: Medicaid Other | Admitting: Nurse Practitioner

## 2020-11-22 ENCOUNTER — Telehealth: Payer: Self-pay | Admitting: Hematology

## 2020-11-22 ENCOUNTER — Inpatient Hospital Stay: Payer: Medicaid Other | Attending: Hematology

## 2020-11-22 ENCOUNTER — Other Ambulatory Visit: Payer: Self-pay

## 2020-11-22 VITALS — BP 145/73 | HR 84 | Temp 97.9°F | Resp 19 | Ht 64.0 in | Wt 167.1 lb

## 2020-11-22 DIAGNOSIS — Z79899 Other long term (current) drug therapy: Secondary | ICD-10-CM | POA: Diagnosis not present

## 2020-11-22 DIAGNOSIS — Z170 Estrogen receptor positive status [ER+]: Secondary | ICD-10-CM | POA: Insufficient documentation

## 2020-11-22 DIAGNOSIS — C50211 Malignant neoplasm of upper-inner quadrant of right female breast: Secondary | ICD-10-CM

## 2020-11-22 DIAGNOSIS — Z9011 Acquired absence of right breast and nipple: Secondary | ICD-10-CM | POA: Insufficient documentation

## 2020-11-22 DIAGNOSIS — R109 Unspecified abdominal pain: Secondary | ICD-10-CM | POA: Diagnosis present

## 2020-11-22 DIAGNOSIS — R0781 Pleurodynia: Secondary | ICD-10-CM | POA: Diagnosis not present

## 2020-11-22 DIAGNOSIS — Z923 Personal history of irradiation: Secondary | ICD-10-CM | POA: Insufficient documentation

## 2020-11-22 DIAGNOSIS — Z9221 Personal history of antineoplastic chemotherapy: Secondary | ICD-10-CM | POA: Diagnosis not present

## 2020-11-22 DIAGNOSIS — M79669 Pain in unspecified lower leg: Secondary | ICD-10-CM | POA: Diagnosis not present

## 2020-11-22 DIAGNOSIS — F419 Anxiety disorder, unspecified: Secondary | ICD-10-CM | POA: Insufficient documentation

## 2020-11-22 DIAGNOSIS — Z79811 Long term (current) use of aromatase inhibitors: Secondary | ICD-10-CM | POA: Insufficient documentation

## 2020-11-22 LAB — CBC WITH DIFFERENTIAL (CANCER CENTER ONLY)
Abs Immature Granulocytes: 0.01 10*3/uL (ref 0.00–0.07)
Basophils Absolute: 0 10*3/uL (ref 0.0–0.1)
Basophils Relative: 0 %
Eosinophils Absolute: 0.1 10*3/uL (ref 0.0–0.5)
Eosinophils Relative: 2 %
HCT: 36.2 % (ref 36.0–46.0)
Hemoglobin: 13.2 g/dL (ref 12.0–15.0)
Immature Granulocytes: 0 %
Lymphocytes Relative: 35 %
Lymphs Abs: 1.8 10*3/uL (ref 0.7–4.0)
MCH: 32.4 pg (ref 26.0–34.0)
MCHC: 36.5 g/dL — ABNORMAL HIGH (ref 30.0–36.0)
MCV: 88.7 fL (ref 80.0–100.0)
Monocytes Absolute: 0.4 10*3/uL (ref 0.1–1.0)
Monocytes Relative: 7 %
Neutro Abs: 2.8 10*3/uL (ref 1.7–7.7)
Neutrophils Relative %: 56 %
Platelet Count: 182 10*3/uL (ref 150–400)
RBC: 4.08 MIL/uL (ref 3.87–5.11)
RDW: 12 % (ref 11.5–15.5)
WBC Count: 5.2 10*3/uL (ref 4.0–10.5)
nRBC: 0 % (ref 0.0–0.2)

## 2020-11-22 LAB — CMP (CANCER CENTER ONLY)
ALT: 14 U/L (ref 0–44)
AST: 21 U/L (ref 15–41)
Albumin: 4.4 g/dL (ref 3.5–5.0)
Alkaline Phosphatase: 81 U/L (ref 38–126)
Anion gap: 11 (ref 5–15)
BUN: 11 mg/dL (ref 6–20)
CO2: 28 mmol/L (ref 22–32)
Calcium: 9.3 mg/dL (ref 8.9–10.3)
Chloride: 103 mmol/L (ref 98–111)
Creatinine: 0.66 mg/dL (ref 0.44–1.00)
GFR, Estimated: >60 mL/min (ref >60)
Glucose, Bld: 110 mg/dL — ABNORMAL HIGH (ref 70–99)
Potassium: 3.3 mmol/L — ABNORMAL LOW (ref 3.5–5.1)
Sodium: 142 mmol/L (ref 135–145)
Total Bilirubin: 0.6 mg/dL (ref 0.3–1.2)
Total Protein: 7.6 g/dL (ref 6.5–8.1)

## 2020-11-22 LAB — MAGNESIUM: Magnesium: 1.8 mg/dL (ref 1.7–2.4)

## 2020-11-22 MED ORDER — POTASSIUM CHLORIDE CRYS ER 20 MEQ PO TBCR: 20.0000 meq | EXTENDED_RELEASE_TABLET | Freq: Every day | ORAL | 3 refills | Status: DC

## 2020-11-22 NOTE — Telephone Encounter (Signed)
Scheduled follow-up appointments per 4/28 los. Patient is aware. ?

## 2020-11-22 NOTE — Progress Notes (Addendum)
[place]   [phone] Fax:([phone]   Clinic Follow up Note   Patient Care Team: Lujean Amel, MD as PCP - General (Family Medicine) Fanny Skates, MD as Consulting Physician (General Surgery) Truitt Merle, MD as Consulting Physician (Hematology) Eppie Gibson, MD as Attending Physician (Radiation Oncology) Mauro Kaufmann, RN as Oncology Nurse Navigator Rockwell Germany, RN as Oncology Nurse Navigator Date of Service: 11/22/2020   CHIEF COMPLAINT: Follow-up right breast cancer, right back pain  SUMMARY OF ONCOLOGIC HISTORY: Oncology History Overview Note  Cancer Staging Malignant neoplasm of upper-inner quadrant of right breast in female, estrogen receptor positive ([place]) Staging form: Breast, AJCC 8th Edition - Clinical stage from 03/08/2018: Stage IB (cT2, cN1, cM0, G3, ER+, PR+, HER2+) - Signed by Truitt Merle, MD on 03/16/2018     Malignant neoplasm of upper-inner quadrant of right breast in female, estrogen receptor positive ([place])  02/24/2018 Mammogram   screening mammogram on 02/24/2018 that was benign   03/04/2018 Mammogram   diagnostic mammogram on 03/04/2018 due to a palpable mass on her right breast. Diagnostic mammogram, breast US revealed and biopsy revealed suspicious mass in the right breast at 2:30 at the palpable site of concern and one suspicious right axillary LN.   03/08/2018 Cancer Staging   Staging form: Breast, AJCC 8th Edition - Clinical stage from 03/08/2018: Stage IB (cT2, cN1, cM0, G3, ER+, PR+, HER2+) - Signed by Truitt Merle, MD on 03/16/2018   03/08/2018 Receptors her2   Her2 positive, ER 90% PR 5% and Ki67 60%   03/08/2018 Pathology Results   Diagnosis 1. Breast, right, needle core biopsy, 2:30 - INVASIVE DUCTAL CARCINOMA, GRADE 3. - LYMPHOVASCULAR SPACE INVOLVEMENT BY TUMOR. 2. Lymph node, needle/core biopsy, right axilla - METASTATIC CARCINOMA INVOLVING SCANT LYMPHOID TISSUE.   03/10/2018 Initial Diagnosis   Malignant  neoplasm of upper-inner quadrant of right breast in female, estrogen receptor positive ([place])   03/20/2018 Imaging   MRI brain 03/20/18 IMPRESSION: No evidence of metastatic disease.  Normal appearance of brain. Slightly heterogeneous marrow pattern of the upper cervical spine, nonspecific but likely benign, especially in the setting of early stage disease.    03/24/2018 Echocardiogram   Baseline ECHO 03/24/18 Study Conclusions - Left ventricle: The cavity size was normal. Wall thickness was   increased in a pattern of mild LVH. Systolic function was normal.   The estimated ejection fraction was in the range of 60% to 65%.   Wall motion was normal; there were no regional wall motion   abnormalities. Doppler parameters are consistent with abnormal   left ventricular relaxation (grade 1 diastolic dysfunction). - Right ventricle: The cavity size was mildly dilated. Wall   thickness was normal.   03/25/2018 Imaging   MRI Breast B/l 03/25/18 IMPRESSION: 1. The patient's primary malignancy in the posterior right breast measures 3.2 x 2 x 2.6 cm with apparent invasion of the underlying pectoralis muscle. Numerous surrounding abnormal enhancing masses, consistent with satellite lesions, extend from 11 o'clock in the right breast into the inferolateral right breast with a total span of suspected disease measuring 4.5 x 4.6 x 6.4 cm in AP, transverse, and craniocaudal dimension. The suspected extent of disease involves the superior and inferolateral quadrants. The primary malignancy also extends just across midline into the superior medial quadrant. 2. No MRI evidence of malignancy in the left breast. 3. Known metastatic noted in the right axilla.   03/25/2018 Imaging   Whole body bone scan 03/25/18 IMPRESSION: No scintigraphic evidence  skeletal metastasis.   03/25/2018 Imaging   CT CAP W Contrast 03/25/18 IMPRESSION: 1. Mass within the deep aspect of the right breast along the pectoralis  muscle compatible with primary breast malignancy. 2. There is suggestion of two additional possible masses within the lateral aspect of the right breast versus nodular appearing breast tissue. Recommend dedicated evaluation with bilateral breast MRI. 3. Mildly thickened right axillary lymph node compatible with metastatic adenopathy, recently biopsied. 4. Multiple bilateral pulmonary nodules are indeterminate, potentially sequelae of prior infectious/inflammatory process. Recommend attention on follow-up. Consider follow-up CT in 6 months. 5. Portal venous gas is demonstrated within the left hepatic lobe. Additionally, there is wall thickening of the cecum and ascending colon with small amount of gas within the pericolonic vasculature. Constellation of findings is indeterminate in etiology however may be secondary to colitis at this location with considerations including infectious, inflammatory or ischemic etiologies. Correlate for recent procedure. If patient is not up-to-date for colonic screening, recommend further evaluation with colonoscopy to exclude the possibility of colonic mass within the cecum/ascending colon. 6. These results were called by telephone at the time of interpretation on 03/25/2018 at 9:31 am to Dr. Truitt Merle , who verbally acknowledged these results.   03/30/2018 - 04/08/2019 Chemotherapy   Neoadjuvant TCHP every 3 weeks starting 03/30/18. Completed 6 cycles of TCHP on 07/23/18.  Continued with maintenance Herceptin/Perjeta q3weeks to complete 1 year.    04/09/2018 Genetic Testing   Negative genetic testing on the [facility] Common Hereditary Cancers Panel. The Common Hereditary Cancers Panel offered by [facility] includes sequencing and/or deletion duplication testing of the following 47 genes: APC, ATM, AXIN2, BARD1, BMPR1A, BRCA1, BRCA2, BRIP1, CDH1, CDKN2A (p14ARF), CDKN2A (p16INK4a), CKD4, CHEK2, CTNNA1, DICER1, EPCAM (Deletion/duplication testing only), GREM1 (promoter  region deletion/duplication testing only), KIT, MEN1, MLH1, MSH2, MSH3, MSH6, MUTYH, NBN, NF1, NHTL1, PALB2, PDGFRA, PMS2, POLD1, POLE, PTEN, RAD50, RAD51C, RAD51D, SDHB, SDHC, SDHD, SMAD4, SMARCA4. STK11, TP53, TSC1, TSC2, and VHL.  The following genes were evaluated for sequence changes only: SDHA and HOXB13 c.251G>A variant only.   Genetic testing did detect a Variant of Unknown Significance (VUS) in the POLD1 gene called c.985C>T (p.Pro329Ser). At this time, it is unknown if this variant is associated with increased cancer risk or if this is a normal finding, but most variants such as this get reclassified to being inconsequential. It should not be used to make medical management decisions.   The report date is 04/09/2018.   07/24/2018 Breast MRI   IMPRESSION: 1. Resolution of the enhancing mass (known cancer) and small satellite lesions previously seen in the right breast following chemotherapy.  2. The metastatic lymph node in the right axilla has decreased in size.  3.  No MRI evidence of left breast malignancy.   09/14/2018 Surgery   RIGHT NIPPLE SPARING MASTECTOMY WITH TARGETED DEEP RIGHT AXILLARY LYMPH NODE BIOPSY WITH RADIOACTIVE SEED LOCALIZATION AND RIGHT AXILLARY SENTINEL LYMPH NODE BIOPSY by Dr. Dalbert Batman  09/14/18    09/14/2018 Pathology Results   Diagnosis 1. Lymph node, sentinel, biopsy, right axillary with radioactive seed - ONE OF ONE LYMPH NODES NEGATIVE FOR CARCINOMA (0/1). - MILD TREATMENT EFFECT. - BIOPSY SITE. 2. Lymph node, sentinel, biopsy, right axillary - ONE OF ONE LYMPH NODES NEGATIVE FOR CARCINOMA (0/1). 3. Lymph node, sentinel, biopsy, right - ONE OF ONE LYMPH NODES NEGATIVE FOR CARCINOMA (0/1). 4. Lymph node, sentinel, biopsy, right - ONE OF ONE LYMPH NODES NEGATIVE FOR CARCINOMA (0/1). 5. Lymph node, sentinel, biopsy, right - ONE  OF ONE LYMPH NODES NEGATIVE FOR CARCINOMA (0/1). 6. Lymph node, sentinel, biopsy, right - ONE OF ONE LYMPH NODES NEGATIVE  FOR CARCINOMA (0/1). 7. Lymph node, sentinel, biopsy, right - ONE OF ONE LYMPH NODES NEGATIVE FOR CARCINOMA (0/1). 8. Lymph node, sentinel, biopsy, right - ONE OF ONE LYMPH NODES NEGATIVE FOR CARCINOMA (0/1). 9. Lymph node, sentinel, biopsy, right - ONE OF ONE LYMPH NODES NEGATIVE FOR CARCINOMA (0/1). 10. Lymph node, sentinel, biopsy, right - ONE OF ONE LYMPH NODES NEGATIVE FOR CARCINOMA (0/1). 11. Lymph node, sentinel, biopsy, right - ONE OF ONE LYMPH NODES NEGATIVE FOR CARCINOMA (0/1). 12. Lymph node, sentinel, biopsy, right - ONE OF ONE LYMPH NODES NEGATIVE FOR CARCINOMA (0/1). 13. Nipple Biopsy, right - BENIGN NIPPLE TISSUE. 14. Breast, simple mastectomy, right - NO RESIDUAL CARCINOMA IDENTIFIED. - TREATMENT EFFECT. - FIBROCYSTIC CHANGE. - ONE OF ONE LYMPH NODES NEGATIVE FOR CARCINOMA (0/1). - SEE ONCOLOGY TABLE.   09/14/2018 Cancer Staging   Staging form: Breast, AJCC 8th Edition - Pathologic stage from 09/14/2018: No Stage Recommended (ypT0, pN0, cM0, GX, ER: Not Assessed, PR: Not Assessed, HER2: Not Assessed) - Signed by Truitt Merle, MD on 09/23/2018   11/03/2018 - 12/10/2018 Radiation Therapy   Adjuvant Radiation with Dr. Isidore Moos    12/27/2018 -  Anti-estrogen oral therapy   Start Tamoxifen 23m daily in 12/27/2018 stopped after 1 week due to poor toleration. She started anastrozole on 02/04/19 but stopped on 02/14/19 due to worsening muscle aches. She tried Letrozole but did not tolerate. She was switched to Exemestane in late 04/2019.    01/04/2019 Imaging   CT chest  IMPRESSION: 1. Stable tiny subpleural and perifissural nodules bilaterally. No suspicious pulmonary nodule or mass. 2. No focal airspace disease in the right lung. 3.  Aortic Atherosclerois (ICD10-170.0)     03/08/2019 Imaging   CT AP W Contrast 03/08/19  IMPRESSION: 1. No metastatic disease in the abdomen pelvis. 2. No evidence adverse immunotherapy therapy reaction.   03/08/2019 Imaging   Whole body bone scan  03/08/19  IMPRESSION: No definite scintigraphic evidence of osseous metastases.   08/29/2019 - 08/29/2020 Chemotherapy   Started Nerlynx 2460mdaily with budesonide on 08/29/19. Reduced to 20071mn late 08/2019. Completed on 08/29/20       CURRENT THERAPY:  -Tamoxifen 72m47mlystarting6/1/2020stopped after 1 week due to poor toleration.She started anastrozole on 02/04/19 but stopped on 02/14/19 due to worsening muscle aches.She tried Letrozole but did not tolerate.She was switched to Exemestane in late10/2020. -Nerlynx240mg39mlywithbudesonideon 08/29/19. Reduced to 200mg 24mate 08/2019, completed 08/2020    INTERVAL HISTORY: Ms. HedricCataldons for follow-up.  Her appointment was moved sooner due to unresolving right posterior rib/flank pain that waxes and wanes over past couple months. Pain is worse at night, feels "like a punch," and it can occur at rest. Occasionally wraps around the front and radiates to both shoulder blades. Also has pain at base of her neck. She denies recent strenuous activity or injury. she went on vacation last week and did some heavy lifting. She may lift 20 lbs at work. Before her vacation she had laryngitis and body aches. A chest xray on 10/30/20 showed no acute process. When this rib/flank pain began she had urinary frequency but that normalized and she denies UTI-like symptoms. She thinks she had microscopic blood in her urine at some point. Last night she also had body aches in legs and feet. She became emotional. She also notes bilateral low axilla/chest wall soreness that has been  there "for some time." Notes her bra can aggravate this pain. She called my nurse earlier this week and I instructed her to hold exemestane. No change in her pain so far. Advil helps.   She was last seen by Dr. Burr Medico on 08/30/2020 for urgent visit to evaluate a new lump in the right breast.  Right breast ultrasound on 09/13/2020 showed a 0.4 x 0.3 x 0.4 cm mass at 11:00, 6 cm from nipple, felt to  be a benign oil cyst.  She was referred back to her plastic surgeon Dr. Iran Planas and Derm for this. She has derm appt next week to also monitor a red area on her upper chest.   She otherwise feels OK. Since stopping Nerlynx she gained weight. She snores heavily now. Her sister and mother use CPAPs. She is fatigued. She had abdominal bloating but no pain. Denies n/v. She has BM every couple days. Denies fever, chills, chest pain, dyspnea, leg edema.    MEDICAL HISTORY:  Past Medical History:  Diagnosis Date  . Abdominal pain, unspecified site   . Anxiety state, unspecified   . Complication of anesthesia    convulsions - when she has appendectomy about 12 years ago at [facility]  . Contact dermatitis and other eczema, due to unspecified cause   . Essential hypertension, benign   . Family history of colon cancer   . Family history of melanoma   . Family history of prostate cancer   . GERD (gastroesophageal reflux disease)   . Hematuria, unspecified   . Hypertension   . Lower back pain   . Mixed hyperlipidemia   . Rosacea   . rt breast ca dx'd 01/2018   R breast cancer  . Unspecified symptom associated with female genital organs     SURGICAL HISTORY: Past Surgical History:  Procedure Laterality Date  . BREAST RECONSTRUCTION WITH PLACEMENT OF TISSUE EXPANDER AND ALLODERM Right 09/14/2018   Procedure: BREAST RECONSTRUCTION WITH PLACEMENT OF TISSUE EXPANDER AND ALLODERM;  Surgeon: Irene Limbo, MD;  Location: [place];  Service: Plastics;  Laterality: Right;  . CESAREAN SECTION    . DIAGNOSTIC LAPAROSCOPY    . EYE SURGERY     Lasik  . LAPAROSCOPIC APPENDECTOMY    . LIPOSUCTION WITH LIPOFILLING Right 07/26/2019   Procedure: LIPOFILLING from abdomen to R chest;  Surgeon: Irene Limbo, MD;  Location: [place];  Service: Plastics;  Laterality: Right;  . MASTECTOMY WITH RADIOACTIVE SEED GUIDED EXCISION AND AXILLARY SENTINEL LYMPH NODE BIOPSY Right 09/14/2018    Procedure: RIGHT NIPPLE SPARING MASTECTOMY WITH TARGETED DEEP RIGHT AXILLARY LYMPH NODE BIOPSY WITH RADIOACTIVE SEED LOCALIZATION AND RIGHT AXILLARY SENTINEL LYMPH NODE BIOPSY, FROZEN SECTION AND POSSIBLE COMPLETION AXILLARY LYMPH NODE DISSECTION INJECT BLUE DYE RIGHT BREAST;  Surgeon: Fanny Skates, MD;  Location: [place];  Service: General;  Laterality: Right;  . PORT-A-CATH REMOVAL Left 07/26/2019   Procedure: REMOVAL PORT-A-CATH;  Surgeon: Irene Limbo, MD;  Location: [place];  Service: Plastics;  Laterality: Left;  . PORTACATH PLACEMENT Left 04/13/2018   Procedure: INSERTION PORT-A-CATH WITH [place];  Surgeon: Fanny Skates, MD;  Location: [place];  Service: General;  Laterality: Left;  . REMOVAL OF TISSUE EXPANDER AND PLACEMENT OF IMPLANT Right 07/26/2019   Procedure: REMOVAL OF TISSUE EXPANDER AND PLACEMENT OF IMPLANT;  Surgeon: Irene Limbo, MD;  Location: [place];  Service: Plastics;  Laterality: Right;  . UTERINE FIBROID SURGERY      I have reviewed  the social history and family history with the patient and they are unchanged from previous note.  ALLERGIES:  is allergic to metrogel [metronidazole], other, paxil [paroxetine hcl], sulfamethoxazole, zomig [zolmitriptan], and prednisone.  MEDICATIONS:  Current Outpatient Medications  Medication Sig Dispense Refill  . ALPRAZolam (XANAX) 0.5 MG tablet TAKE 1 TABLET(0.5 MG) BY MOUTH AT BEDTIME AS NEEDED FOR ANXIETY OR SLEEP 30 tablet 0  . Ascorbic Acid (VITAMIN C) 1000 MG tablet Take 2,000 mg by mouth daily.    Marland Kitchen atenolol (TENORMIN) 50 MG tablet Take 50 mg by mouth at bedtime.    . Cholecalciferol (VITAMIN D3) 50 MCG (2000 UT) capsule Take 2,000 Units by mouth daily.    Marland Kitchen exemestane (AROMASIN) 25 MG tablet Take 1 tablet (25 mg total) by mouth daily after breakfast. 90 tablet 1  . HYDROcodone-acetaminophen (NORCO/VICODIN) 5-325 MG tablet Take 1-2 tablets by mouth every 6 (six) hours  as needed. 10 tablet 0  . ibuprofen (ADVIL) 400 MG tablet Take 400 mg by mouth 3 (three) times daily as needed.    . meloxicam (MOBIC) 15 MG tablet Take 1 tablet (15 mg total) by mouth daily. Take with food 15 tablet 0  . potassium chloride SA (KLOR-CON) 20 MEQ tablet Take 1 tablet (20 mEq total) by mouth daily. 30 tablet 3  . valACYclovir (VALTREX) 500 MG tablet valacyclovir 500 mg tablet  TAKE 1 TABLET BY MOUTH TWICE DAILY    . venlafaxine XR (EFFEXOR-XR) 150 MG 24 hr capsule TAKE 1 CAPSULE(150 MG) BY MOUTH DAILY WITH BREAKFAST 30 capsule 5  . Zinc 50 MG TABS Take 1 tablet by mouth daily.     No current facility-administered medications for this visit.    PHYSICAL EXAMINATION: ECOG PERFORMANCE STATUS: 1 - Symptomatic but completely ambulatory  Vitals:   11/22/20 0922  BP: (!) 145/73  Pulse: 84  Resp: 19  Temp: 97.9 F (36.6 C)  SpO2: 100%   Filed Weights   11/22/20 0922  Weight: 167 lb 1.6 oz (75.8 kg)    GENERAL:alert, no distress and comfortable SKIN: small macular area to upper center chest, sun-exposed skin. No rash  EYES: sclera clear NECK: without mass LYMPH:  no palpable cervical or supraclavicular lymphadenopathy  LUNGS: clear with normal breathing effort HEART: regular rate & rhythm, no lower extremity edema ABDOMEN: abdomen soft, non-tender and normal bowel sounds Musculoskeletal: no spinal tenderness. Focal ttp right lower posterior rib at flank and bilateral ttp low axilla/chest wall  NEURO: alert & oriented x 3 with fluent speech, no focal motor/sensory deficits PAC removed Breast No b/l nipple discharge or inversion. S/p right mastectomy with implant reconstruction. Small pea-sized deep nodule upper outer quadrant [place] felt to represent benign cyst. Left breast and b/l axilla are benign.   LABORATORY DATA:  I have reviewed the data as listed CBC Latest Ref Rng & Units 11/22/2020 05/04/2020 03/02/2020  WBC 4.0 - 10.5 K/uL 5.2 6.0 6.1  Hemoglobin 12.0 - 15.0 g/dL  13.2 13.6 13.2  Hematocrit 36.0 - 46.0 % 36.2 37.8 36.9  Platelets 150 - 400 K/uL 182 178 201     CMP Latest Ref Rng & Units 11/22/2020 07/04/2020 05/04/2020  Glucose 70 - 99 mg/dL 110(H) 103(H) 91  BUN 6 - 20 mg/dL 11 16 11   Creatinine 0.44 - 1.00 mg/dL 0.66 0.77 0.77  Sodium 135 - 145 mmol/L 142 143 144  Potassium 3.5 - 5.1 mmol/L 3.3(L) 3.8 3.1(L)  Chloride 98 - 111 mmol/L 103 107 105  CO2 22 -  32 mmol/L 28 28 33(H)  Calcium 8.9 - 10.3 mg/dL 9.3 9.4 9.8  Total Protein 6.5 - 8.1 g/dL 7.6 7.8 7.6  Total Bilirubin 0.3 - 1.2 mg/dL 0.6 0.7 0.8  Alkaline Phos 38 - 126 U/L 81 98 96  AST 15 - 41 U/L 21 21 17   ALT 0 - 44 U/L 14 12 11       RADIOGRAPHIC STUDIES: I have personally reviewed the radiological images as listed and agreed with the findings in the report. No results found.   ASSESSMENT & PLAN: 55 yo female   1. Right posterior rib/flank pain, lower legs, diffuse body aches  -onset 2-3 months ago, mostly mild and intermittent with periodic exacerbations unrelated to activity.  -no known injury or inciting event  -exam shows mild tenderness at b/l low axilla/chest wall and right posterior rib/flank. Labs [place] -she held exemestane since 11/20/20 -given the chronicity and intermittent nature, not like malignant pain r/t bone metastasis -this is likely arthralgia secondary to AI -Pt was seen with Dr. Burr Medico, we recommend to continue holding exemestane for 6-8 weeks, and use supportive care at home.  -will check in with phone visit in 6-8 weeks to re-evaluate.  -If pain is improved, will recommend to re-challenge tamoxifen (only took 1 week, pt can't recall AE's that made her stop). Will try 10 mg daily at first, then advance to full dose if tolerable.  -If pain is no better, or worse, will proceed with CT/bone scan  -F/up in 3 months, or sooner if needed   2.Malignant neoplasm of upper-inner quadrant of right breast in female, invasive ductal carcinoma, cT2N1M0 stage IB,  ER+/PR+/HER2+, G3  -Diagnosed 02/2018, s/p neoadjuvant TCHP, R mastectomy/ALND, and adjuvant radiation. She completed 1 year maintenance herceptin/perjeta in 03/2019 and Neratinib 08/2019 - 2/022.  -she started anti-estrogen therapy 12/2018, did not tolerate tamoxifen, anastrozole, or letrozole. On exemestane since 04/2019 tolerating better -s/p reconstruction by Dr. Iran Planas 07/26/19 -last visit 08/30/20 showed palpable deep nodule RUQ right breast, US showed benign cyst. Otherwise doing well  -no evidence of recurrence, continue surveillance  3. Anxiety  -She has history of anxiety, currently on Xanax as needed  PLAN: -hold exemestane -phone f/up 6-8 weeks to re-evaluate -if pain improved, re-challenge tamoxifen -if pain worse or no better, proceed with CT/bone scan  -next routine surveillance f/up in 3/months, or sooner if needed  -pt seen with Dr. Burr Medico.   All questions were answered. The patient knows to call the clinic with any problems, questions or concerns. No barriers to learning were detected.     Claire Feeling, NP 11/22/2020  Addendum  Claire has developed intermittent body aches, especially in bilateral rib cage, right flank area, and legs.  Lab and exam were unremarkable. We discussed this is possible related to exemestane, no high concern for metastatic breast cancer.  I recommend hold on exemestane for the next few months, to see if her body pain resolves.  He has previously tried anastrozole and letrozole, and did not tolerate well.  She initially took tamoxifen for 1 week, and stopped due to poor tolerance also.  She does not remember the details.  If her body pain resolves after coming off exemestane, I would encourage her to retry tamoxifen, starting at low-dose, to see if she can tolerate.  If not, we will stop and continue breast cancer surveillance.  She voiced good understanding, and agrees with the plan.  She will call me in 4 to 6 weeks, if her body  aches gets worse  or does not improve significantly, then I will consider CT and bone scan.  Truitt Merle MD 11/22/2020

## 2020-11-23 LAB — CANCER ANTIGEN 27.29: CA 27.29: 26.2 U/mL (ref 0.0–38.6)

## 2020-11-26 ENCOUNTER — Encounter: Payer: Self-pay | Admitting: Nurse Practitioner

## 2020-11-30 ENCOUNTER — Other Ambulatory Visit: Payer: Self-pay | Admitting: Family Medicine

## 2020-11-30 DIAGNOSIS — M542 Cervicalgia: Secondary | ICD-10-CM

## 2020-12-03 ENCOUNTER — Ambulatory Visit: Payer: Medicaid Other | Admitting: Hematology

## 2020-12-03 ENCOUNTER — Other Ambulatory Visit: Payer: Medicaid Other

## 2020-12-06 ENCOUNTER — Ambulatory Visit: Payer: Medicaid Other | Admitting: Vascular Surgery

## 2020-12-06 ENCOUNTER — Other Ambulatory Visit: Payer: Self-pay

## 2020-12-06 ENCOUNTER — Encounter: Payer: Self-pay | Admitting: Vascular Surgery

## 2020-12-06 VITALS — BP 132/77 | HR 79 | Temp 97.0°F | Resp 14 | Ht 64.0 in | Wt 167.0 lb

## 2020-12-06 DIAGNOSIS — I83812 Varicose veins of left lower extremities with pain: Secondary | ICD-10-CM

## 2020-12-06 NOTE — Progress Notes (Signed)
REASON FOR CONSULT:    Bleeding from varicose veins.  The consult is requested by Dibas Koirala.  ASSESSMENT & PLAN:   BLEEDING SPIDER VEIN LEFT LEG: This patient has a small spider vein on her left anterior leg which has bled multiple times. This is a telangiectasia.  She does not have evidence of more significant venous disease.  She has no large truncal varicosities, significant leg swelling, or hyperpigmentation.  She has CEAP C1 venous disease.  We have discussed importance of intermittent leg elevation and the proper positioning for this.  I have encouraged her to avoid prolonged sitting and standing.  I would recommend sclerotherapy for this area to prevent future bleeding episodes.  After this is healed I think she could wear a knee-high compression stocking with a gradient of 15 to 20 mmHg when she will be standing for a prolonged period of time.  If she develops recurrent bleeding episodes are more significant venous disease on exam or symptoms then certainly we can arrange formal venous reflux testing.  I did look at her great saphenous vein myself with the SonoSite and the vein was not especially dilated.  She will call Estell Harpin, RN to discuss sclerotherapy.   Claire Mayo, Claire Guzman Office: [phone]   HPI:   Claire Guzman is a pleasant 55 y.o. female, who has had 3 bleeding episodes from a small spider veins on her anterior left leg.  Most recent episode was 1 week ago.  She is had no previous venous procedures.  She has no previous history of DVT.  She does not wear compression stockings.  She does elevate her legs some.  Her past medical history is otherwise fairly unremarkable except for history of breast cancer.  Past Medical History:  Diagnosis Date  . Abdominal pain, unspecified site   . Anxiety state, unspecified   . Complication of anesthesia    convulsions - when she has appendectomy about 12 years ago at [facility]  . Contact dermatitis and other  eczema, due to unspecified cause   . Essential hypertension, benign   . Family history of colon cancer   . Family history of melanoma   . Family history of prostate cancer   . GERD (gastroesophageal reflux disease)   . Hematuria, unspecified   . Hypertension   . Lower back pain   . Mixed hyperlipidemia   . Rosacea   . rt breast ca dx'd 01/2018   R breast cancer  . Unspecified symptom associated with female genital organs     Family History  Problem Relation Age of Onset  . Heart failure Mother   . Melanoma Mother 20       has had several removed over the past ten years  . Prostate cancer Father 38  . Stroke Father   . Heart attack Maternal Grandmother   . Heart attack Maternal Grandfather        possible cancer, unknown type  . Stroke Paternal Grandmother   . Colon cancer Paternal Grandfather        dx upper 31s  . Hypertension Other   . Heart Problems Maternal Aunt        d.60  . Heart Problems Maternal Uncle        d.60  . Colon cancer Paternal Aunt        dx 71s  . Colon cancer Paternal Uncle        dx 10s    SOCIAL HISTORY: Social History  Socioeconomic History  . Marital status: Married    Spouse name: Not on file  . Number of children: 1  . Years of education: Not on file  . Highest education level: Not on file  Occupational History  . Not on file  Tobacco Use  . Smoking status: Never Smoker  . Smokeless tobacco: Never Used  Vaping Use  . Vaping Use: Never used  Substance and Sexual Activity  . Alcohol use: No  . Drug use: Never  . Sexual activity: Not on file  Other Topics Concern  . Not on file  Social History Narrative  . Not on file   Social Determinants of Health   Financial Resource Strain: Not on file  Food Insecurity: Not on file  Transportation Needs: Not on file  Physical Activity: Not on file  Stress: Not on file  Social Connections: Not on file  Intimate Partner Violence: Not on file    Allergies  Allergen Reactions  .  Metrogel [Metronidazole]     Burned her face  . Other     Dental Cement--tongue and cheek burning  . Paxil [Paroxetine Hcl]     Lethargic/felt bad   . Sulfamethoxazole Swelling  . Zomig [Zolmitriptan]     Elevated blood pressure   . Prednisone     Turns skin red, heart racing- side effect    Current Outpatient Medications  Medication Sig Dispense Refill  . ALPRAZolam (XANAX) 0.5 MG tablet TAKE 1 TABLET(0.5 MG) BY MOUTH AT BEDTIME AS NEEDED FOR ANXIETY OR SLEEP 30 tablet 0  . Ascorbic Acid (VITAMIN C) 1000 MG tablet Take 2,000 mg by mouth daily.    Marland Kitchen atenolol (TENORMIN) 50 MG tablet Take 50 mg by mouth at bedtime.    . Cholecalciferol (VITAMIN D3) 50 MCG (2000 UT) capsule Take 2,000 Units by mouth daily.    Marland Kitchen exemestane (AROMASIN) 25 MG tablet Take 1 tablet (25 mg total) by mouth daily after breakfast. 90 tablet 1  . HYDROcodone-acetaminophen (NORCO/VICODIN) 5-325 MG tablet Take 1-2 tablets by mouth every 6 (six) hours as needed. 10 tablet 0  . ibuprofen (ADVIL) 400 MG tablet Take 400 mg by mouth 3 (three) times daily as needed.    . meloxicam (MOBIC) 15 MG tablet Take 1 tablet (15 mg total) by mouth daily. Take with food 15 tablet 0  . potassium chloride SA (KLOR-CON) 20 MEQ tablet Take 1 tablet (20 mEq total) by mouth daily. 30 tablet 3  . valACYclovir (VALTREX) 500 MG tablet valacyclovir 500 mg tablet  TAKE 1 TABLET BY MOUTH TWICE DAILY    . venlafaxine XR (EFFEXOR-XR) 150 MG 24 hr capsule TAKE 1 CAPSULE(150 MG) BY MOUTH DAILY WITH BREAKFAST 30 capsule 5  . Zinc 50 MG TABS Take 1 tablet by mouth daily.     No current facility-administered medications for this visit.    REVIEW OF SYSTEMS:  [X]  denotes positive finding, [ ]  denotes negative finding Cardiac  Comments:  Chest pain or chest pressure:    Shortness of breath upon exertion:    Short of breath when lying flat:    Irregular heart rhythm:        Vascular    Pain in calf, thigh, or hip brought on by ambulation:     Pain in feet at night that wakes you up from your sleep:     Blood clot in your veins:    Leg swelling:         Pulmonary  Oxygen at home:    Productive cough:     Wheezing:         Neurologic    Sudden weakness in arms or legs:     Sudden numbness in arms or legs:     Sudden onset of difficulty speaking or slurred speech:    Temporary loss of vision in one eye:     Problems with dizziness:         Gastrointestinal    Blood in stool:     Vomited blood:         Genitourinary    Burning when urinating:     Blood in urine:        Psychiatric    Major depression:         Hematologic    Bleeding problems:    Problems with blood clotting too easily:        Skin    Rashes or ulcers:        Constitutional    Fever or chills:     PHYSICAL EXAM:   Vitals:   12/06/20 1032  BP: 132/77  Pulse: 79  Resp: 14  Temp: (!) 97 F (36.1 C)  TempSrc: Temporal  SpO2: 97%  Weight: 167 lb (75.8 kg)  Height: 5\' 4"  (1.626 m)    GENERAL: The patient is a well-nourished female, in no acute distress. The vital signs are documented above. CARDIAC: There is a regular rate and rhythm.  VASCULAR: I do not detect carotid bruits. She has palpable posterior tibial pulses bilaterally. She has some small spider veins bilaterally. The photograph below shows the area which has bled 3 times.    PULMONARY: There is good air exchange bilaterally without wheezing or rales. ABDOMEN: Soft and non-tender with normal pitched bowel sounds.  MUSCULOSKELETAL: There are no major deformities or cyanosis. NEUROLOGIC: No focal weakness or paresthesias are detected. SKIN: There are no ulcers or rashes noted. PSYCHIATRIC: The patient has a normal affect.  DATA:    No venous duplex scan was done today.

## 2020-12-10 ENCOUNTER — Other Ambulatory Visit: Payer: Self-pay | Admitting: Nurse Practitioner

## 2020-12-10 ENCOUNTER — Telehealth: Payer: Self-pay

## 2020-12-10 ENCOUNTER — Other Ambulatory Visit: Payer: Self-pay | Admitting: Hematology

## 2020-12-10 DIAGNOSIS — F419 Anxiety disorder, unspecified: Secondary | ICD-10-CM

## 2020-12-10 MED ORDER — ALPRAZOLAM 0.5 MG PO TABS: ORAL_TABLET | ORAL | 0 refills | Status: DC

## 2020-12-10 MED ORDER — TAMOXIFEN CITRATE 10 MG PO TABS: 10.0000 mg | ORAL_TABLET | Freq: Two times a day (BID) | ORAL | 1 refills | Status: DC

## 2020-12-10 NOTE — Telephone Encounter (Signed)
That's great! Yes, we recommend to retry low dose tamoxifen. She took it in the past, stopped after 1 week but she doesn't recall why. I called in tamoxifen 10 mg BID. Have her start just once daily for a month, then up to BID if she can tolerate it.   Thanks, Regan Rakers

## 2020-12-10 NOTE — Telephone Encounter (Signed)
Nn left vm stating that her pain has resolved since stopping exemestane.  She wants to know if there is another AI she can take.

## 2020-12-11 NOTE — Telephone Encounter (Signed)
I reviewed Lacie's comments and recommendations with Jenn.  She verbalized understanding.

## 2020-12-12 ENCOUNTER — Ambulatory Visit
Admission: RE | Admit: 2020-12-12 | Discharge: 2020-12-12 | Disposition: A | Discharge status: Home / Self Care | Payer: Medicaid Other | Source: Ambulatory Visit | Attending: Family Medicine | Admitting: Family Medicine

## 2020-12-12 DIAGNOSIS — M542 Cervicalgia: Secondary | ICD-10-CM

## 2020-12-17 ENCOUNTER — Telehealth: Payer: Self-pay

## 2020-12-17 NOTE — Telephone Encounter (Signed)
Called pt to let her know we just heard back today from her insurance company and they did not approve the request for sclerotherapy to be covered for vein that had previously bled. Pt does not wish to proceed with sclerotherapy out of pocket at this time and will call us back if she changes her mind.

## 2020-12-18 ENCOUNTER — Telehealth: Payer: Self-pay

## 2020-12-18 NOTE — Telephone Encounter (Signed)
Claire Guzman called stating she started the tamoxifen.  She is now having pain, decreased energy, and indigestion. Please advise

## 2020-12-19 ENCOUNTER — Encounter: Payer: Self-pay | Admitting: Hematology

## 2020-12-20 ENCOUNTER — Telehealth: Payer: Self-pay | Admitting: Nurse Practitioner

## 2020-12-20 DIAGNOSIS — Z170 Estrogen receptor positive status [ER+]: Secondary | ICD-10-CM

## 2020-12-20 DIAGNOSIS — C50211 Malignant neoplasm of upper-inner quadrant of right female breast: Secondary | ICD-10-CM

## 2020-12-20 MED ORDER — EXEMESTANE 25 MG PO TABS: 25.0000 mg | ORAL_TABLET | Freq: Every day | ORAL | 1 refills | Status: DC

## 2020-12-20 NOTE — Telephone Encounter (Signed)
I spoke with Ms. Jiles, she has severe side effects from tamoxifen including fatigue/lethargy, severe indigestion, and bone pain in legs, knees, ankles, and feet.  I recommend to stop tamoxifen.  We discussed symptom management.  We also discussed discontinuing antiestrogen therapy altogether.  She would rather get back on exemestane than nothing at all, which was tolerable for about a year. I advised her to wait 2-4 weeks before starting exemestane to recover from tamoxifen. Rx called in. She verbalized understanding and appreciates the call. Next f/up 02/20/21.   Cira Rue, NP

## 2021-01-03 ENCOUNTER — Inpatient Hospital Stay: Payer: Medicaid Other | Attending: Hematology | Admitting: Nurse Practitioner

## 2021-01-03 ENCOUNTER — Encounter: Payer: Self-pay | Admitting: Nurse Practitioner

## 2021-01-03 DIAGNOSIS — Z170 Estrogen receptor positive status [ER+]: Secondary | ICD-10-CM | POA: Diagnosis not present

## 2021-01-03 DIAGNOSIS — C50211 Malignant neoplasm of upper-inner quadrant of right female breast: Secondary | ICD-10-CM

## 2021-01-03 NOTE — Progress Notes (Addendum)
[place]   [phone] [phone]   Clinic Follow up Note   Patient Care Team: Lujean Amel, MD as PCP - General (Family Medicine) Fanny Skates, MD as Consulting Physician (General Surgery) Truitt Merle, MD as Consulting Physician (Hematology) Eppie Gibson, MD as Attending Physician (Radiation Oncology) Mauro Kaufmann, RN as Oncology Nurse Navigator Rockwell Germany, RN as Oncology Nurse Navigator 01/03/2021  I connected with Claire Guzman on 01/03/21 at  9:30 AM EDT by telephone visit and verified that I am speaking with the correct person using two identifiers.   I discussed the limitations, risks, security and privacy concerns of performing an evaluation and management service by telemedicine and the availability of in-person appointments. I also discussed with the patient that there may be a patient responsible charge related to this service. The patient expressed understanding and agreed to proceed.   Other persons participating in the visit and their role in the encounter: None   Patient's location: Home  Provider's location: [facility] office   Chief Complaint: Med check    SUMMARY OF ONCOLOGIC HISTORY: Oncology History Overview Note  Cancer Staging Malignant neoplasm of upper-inner quadrant of right breast in female, estrogen receptor positive ([place]) Staging form: Breast, AJCC 8th Edition - Clinical stage from 03/08/2018: Stage IB (cT2, cN1, cM0, G3, ER+, PR+, HER2+) - Signed by Truitt Merle, MD on 03/16/2018      Malignant neoplasm of upper-inner quadrant of right breast in female, estrogen receptor positive ([place])  02/24/2018 Mammogram   screening mammogram on 02/24/2018 that was benign   03/04/2018 Mammogram   diagnostic mammogram on 03/04/2018 due to a palpable mass on her right breast. Diagnostic mammogram, breast US revealed and biopsy revealed suspicious mass in the right breast at 2:30 at the palpable site of concern and one  suspicious right axillary LN.   03/08/2018 Cancer Staging   Staging form: Breast, AJCC 8th Edition - Clinical stage from 03/08/2018: Stage IB (cT2, cN1, cM0, G3, ER+, PR+, HER2+) - Signed by Truitt Merle, MD on 03/16/2018    03/08/2018 Receptors her2   Her2 positive, ER 90% PR 5% and Ki67 60%   03/08/2018 Pathology Results   Diagnosis 1. Breast, right, needle core biopsy, 2:30 - INVASIVE DUCTAL CARCINOMA, GRADE 3. - LYMPHOVASCULAR SPACE INVOLVEMENT BY TUMOR. 2. Lymph node, needle/core biopsy, right axilla - METASTATIC CARCINOMA INVOLVING SCANT LYMPHOID TISSUE.    03/10/2018 Initial Diagnosis   Malignant neoplasm of upper-inner quadrant of right breast in female, estrogen receptor positive ([place])    03/20/2018 Imaging   MRI brain 03/20/18 IMPRESSION: No evidence of metastatic disease.  Normal appearance of brain. Slightly heterogeneous marrow pattern of the upper cervical spine, nonspecific but likely benign, especially in the setting of early stage disease.     03/24/2018 Echocardiogram   Baseline ECHO 03/24/18 Study Conclusions - Left ventricle: The cavity size was normal. Wall thickness was   increased in a pattern of mild LVH. Systolic function was normal.   The estimated ejection fraction was in the range of 60% to 65%.   Wall motion was normal; there were no regional wall motion   abnormalities. Doppler parameters are consistent with abnormal   left ventricular relaxation (grade 1 diastolic dysfunction). - Right ventricle: The cavity size was mildly dilated. Wall   thickness was normal.    03/25/2018 Imaging   MRI Breast B/l 03/25/18 IMPRESSION: 1. The patient's primary malignancy in the posterior right breast measures 3.2 x 2 x 2.6 cm  with apparent invasion of the underlying pectoralis muscle. Numerous surrounding abnormal enhancing masses, consistent with satellite lesions, extend from 11 o'clock in the right breast into the inferolateral right breast with a total span of  suspected disease measuring 4.5 x 4.6 x 6.4 cm in AP, transverse, and craniocaudal dimension. The suspected extent of disease involves the superior and inferolateral quadrants. The primary malignancy also extends just across midline into the superior medial quadrant. 2. No MRI evidence of malignancy in the left breast. 3. Known metastatic noted in the right axilla.    03/25/2018 Imaging   Whole body bone scan 03/25/18 IMPRESSION: No scintigraphic evidence skeletal metastasis.    03/25/2018 Imaging   CT CAP W Contrast 03/25/18 IMPRESSION: 1. Mass within the deep aspect of the right breast along the pectoralis muscle compatible with primary breast malignancy. 2. There is suggestion of two additional possible masses within the lateral aspect of the right breast versus nodular appearing breast tissue. Recommend dedicated evaluation with bilateral breast MRI. 3. Mildly thickened right axillary lymph node compatible with metastatic adenopathy, recently biopsied. 4. Multiple bilateral pulmonary nodules are indeterminate, potentially sequelae of prior infectious/inflammatory process. Recommend attention on follow-up. Consider follow-up CT in 6 months. 5. Portal venous gas is demonstrated within the left hepatic lobe. Additionally, there is wall thickening of the cecum and ascending colon with small amount of gas within the pericolonic vasculature. Constellation of findings is indeterminate in etiology however may be secondary to colitis at this location with considerations including infectious, inflammatory or ischemic etiologies. Correlate for recent procedure. If patient is not up-to-date for colonic screening, recommend further evaluation with colonoscopy to exclude the possibility of colonic mass within the cecum/ascending colon. 6. These results were called by telephone at the time of interpretation on 03/25/2018 at 9:31 am to Dr. Truitt Merle , who verbally acknowledged these results.     03/30/2018 - 04/08/2019 Chemotherapy   Neoadjuvant TCHP every 3 weeks starting 03/30/18. Completed 6 cycles of TCHP on 07/23/18.  Continued with maintenance Herceptin/Perjeta q3weeks to complete 1 year.    04/09/2018 Genetic Testing   Negative genetic testing on the [employer] Common Hereditary Cancers Panel. The Common Hereditary Cancers Panel offered by [employer] includes sequencing and/or deletion duplication testing of the following 47 genes: APC, ATM, AXIN2, BARD1, BMPR1A, BRCA1, BRCA2, BRIP1, CDH1, CDKN2A (p14ARF), CDKN2A (p16INK4a), CKD4, CHEK2, CTNNA1, DICER1, EPCAM (Deletion/duplication testing only), GREM1 (promoter region deletion/duplication testing only), KIT, MEN1, MLH1, MSH2, MSH3, MSH6, MUTYH, NBN, NF1, NHTL1, PALB2, PDGFRA, PMS2, POLD1, POLE, PTEN, RAD50, RAD51C, RAD51D, SDHB, SDHC, SDHD, SMAD4, SMARCA4. STK11, TP53, TSC1, TSC2, and VHL.  The following genes were evaluated for sequence changes only: SDHA and HOXB13 c.251G>A variant only.   Genetic testing did detect a Variant of Unknown Significance (VUS) in the POLD1 gene called c.985C>T (p.Pro329Ser). At this time, it is unknown if this variant is associated with increased cancer risk or if this is a normal finding, but most variants such as this get reclassified to being inconsequential. It should not be used to make medical management decisions.   The report date is 04/09/2018.    07/24/2018 Breast MRI   IMPRESSION: 1. Resolution of the enhancing mass (known cancer) and small satellite lesions previously seen in the right breast following chemotherapy.   2. The metastatic lymph node in the right axilla has decreased in size.   3.  No MRI evidence of left breast malignancy.    09/14/2018 Surgery   RIGHT NIPPLE SPARING MASTECTOMY WITH TARGETED DEEP  RIGHT AXILLARY LYMPH NODE BIOPSY WITH RADIOACTIVE SEED LOCALIZATION AND RIGHT AXILLARY SENTINEL LYMPH NODE BIOPSY by Dr. Dalbert Batman  09/14/18     09/14/2018 Pathology Results    Diagnosis 1. Lymph node, sentinel, biopsy, right axillary with radioactive seed - ONE OF ONE LYMPH NODES NEGATIVE FOR CARCINOMA (0/1). - MILD TREATMENT EFFECT. - BIOPSY SITE. 2. Lymph node, sentinel, biopsy, right axillary - ONE OF ONE LYMPH NODES NEGATIVE FOR CARCINOMA (0/1). 3. Lymph node, sentinel, biopsy, right - ONE OF ONE LYMPH NODES NEGATIVE FOR CARCINOMA (0/1). 4. Lymph node, sentinel, biopsy, right - ONE OF ONE LYMPH NODES NEGATIVE FOR CARCINOMA (0/1). 5. Lymph node, sentinel, biopsy, right - ONE OF ONE LYMPH NODES NEGATIVE FOR CARCINOMA (0/1). 6. Lymph node, sentinel, biopsy, right - ONE OF ONE LYMPH NODES NEGATIVE FOR CARCINOMA (0/1). 7. Lymph node, sentinel, biopsy, right - ONE OF ONE LYMPH NODES NEGATIVE FOR CARCINOMA (0/1). 8. Lymph node, sentinel, biopsy, right - ONE OF ONE LYMPH NODES NEGATIVE FOR CARCINOMA (0/1). 9. Lymph node, sentinel, biopsy, right - ONE OF ONE LYMPH NODES NEGATIVE FOR CARCINOMA (0/1). 10. Lymph node, sentinel, biopsy, right - ONE OF ONE LYMPH NODES NEGATIVE FOR CARCINOMA (0/1). 11. Lymph node, sentinel, biopsy, right - ONE OF ONE LYMPH NODES NEGATIVE FOR CARCINOMA (0/1). 12. Lymph node, sentinel, biopsy, right - ONE OF ONE LYMPH NODES NEGATIVE FOR CARCINOMA (0/1). 13. Nipple Biopsy, right - BENIGN NIPPLE TISSUE. 14. Breast, simple mastectomy, right - NO RESIDUAL CARCINOMA IDENTIFIED. - TREATMENT EFFECT. - FIBROCYSTIC CHANGE. - ONE OF ONE LYMPH NODES NEGATIVE FOR CARCINOMA (0/1). - SEE ONCOLOGY TABLE.    09/14/2018 Cancer Staging   Staging form: Breast, AJCC 8th Edition - Pathologic stage from 09/14/2018: No Stage Recommended (ypT0, pN0, cM0, GX, ER: Not Assessed, PR: Not Assessed, HER2: Not Assessed) - Signed by Truitt Merle, MD on 09/23/2018    11/03/2018 - 12/10/2018 Radiation Therapy   Adjuvant Radiation with Dr. Isidore Moos     12/27/2018 -  Anti-estrogen oral therapy   Start Tamoxifen 48m daily in 12/27/2018 stopped after 1 week due to poor  toleration. She started anastrozole on 02/04/19 but stopped on 02/14/19 due to worsening muscle aches. She tried Letrozole but did not tolerate. She was switched to Exemestane in late 04/2019.    01/04/2019 Imaging   CT chest  IMPRESSION: 1. Stable tiny subpleural and perifissural nodules bilaterally. No suspicious pulmonary nodule or mass. 2. No focal airspace disease in the right lung. 3.  Aortic Atherosclerois (ICD10-170.0)     03/08/2019 Imaging   CT AP W Contrast 03/08/19  IMPRESSION: 1. No metastatic disease in the abdomen pelvis. 2. No evidence adverse immunotherapy therapy reaction.   03/08/2019 Imaging   Whole body bone scan 03/08/19  IMPRESSION: No definite scintigraphic evidence of osseous metastases.   08/29/2019 - 08/29/2020 Chemotherapy   Started Nerlynx 2442mdaily with budesonide on 08/29/19. Reduced to 20054mn late 08/2019. Completed on 08/29/20       CURRENT THERAPY:  Antiestrogen  -Tamoxifen 41m67mily starting 12/27/2018 stopped after 1 week due to poor toleration.  -Started anastrozole on 02/04/19 but stopped on 02/14/19 due to worsening muscle aches.  -Tried Letrozole but did not tolerate.  -Switched to Exemestane in late 04/2019.  Stopped 11/22/20 due to worsening joint pain -Re-challenged Tamoxifen 10 mg BID 12/10/20 - 12/20/20, stopped due to fatigue, lethargy, indigestion, bone pain in legs, knees, ankles, feet -Resumed exemestane 12/26/20   2. Nerlynx 240mg62mly with budesonide on 08/29/19. Reduced to 200mg 51m  late 08/2019, completed 08/2020  INTERVAL HISTORY: Claire Guzman presents for virtual visit as scheduled.  She was last seen in person 11/22/2020, exemestane was stopped due to worsening bone and joint pain.  Side effects resolved off medication and she rechallenged tamoxifen starting 10 mg daily on 12/10/2020.  On 12/20/2020 she reported fatigue/lethargy, severe indigestion, and bone pain in the legs, knees, ankles, and feet and tamoxifen was stopped.  Since then all side  effects resolved except knee and feet pain.  She sees a foot doctor, has had injections and takes NSAIDs which help.  She notes having this foot pain for about a year.  She wears supportive shoes, orthotics.  She is on her feet often, very active.  Got up in the middle of the night recently and almost fell.  She denies balance issue, burning pain, numbness, or tingling.  Otherwise she is tolerating exemestane without specific complaints.  She had basal skin cancer removed from her chest by Dr. Anabel Bene recently.  MEDICAL HISTORY:  Past Medical History:  Diagnosis Date   Abdominal pain, unspecified site    Anxiety state, unspecified    Complication of anesthesia    convulsions - when she has appendectomy about 12 years ago at [facility]   Contact dermatitis and other eczema, due to unspecified cause    Essential hypertension, benign    Family history of colon cancer    Family history of melanoma    Family history of prostate cancer    GERD (gastroesophageal reflux disease)    Hematuria, unspecified    Hypertension    Lower back pain    Mixed hyperlipidemia    Rosacea    rt breast ca dx'd 01/2018   R breast cancer   Unspecified symptom associated with female genital organs     SURGICAL HISTORY: Past Surgical History:  Procedure Laterality Date   BREAST RECONSTRUCTION WITH PLACEMENT OF TISSUE EXPANDER AND ALLODERM Right 09/14/2018   Procedure: BREAST RECONSTRUCTION WITH PLACEMENT OF TISSUE EXPANDER AND ALLODERM;  Surgeon: Irene Limbo, MD;  Location: [place];  Service: Plastics;  Laterality: Right;   CESAREAN SECTION     DIAGNOSTIC LAPAROSCOPY     EYE SURGERY     Lasik   LAPAROSCOPIC APPENDECTOMY     LIPOSUCTION WITH LIPOFILLING Right 07/26/2019   Procedure: LIPOFILLING from abdomen to R chest;  Surgeon: Irene Limbo, MD;  Location: [place];  Service: Plastics;  Laterality: Right;   MASTECTOMY WITH RADIOACTIVE SEED GUIDED EXCISION AND AXILLARY SENTINEL LYMPH  NODE BIOPSY Right 09/14/2018   Procedure: RIGHT NIPPLE SPARING MASTECTOMY WITH TARGETED DEEP RIGHT AXILLARY LYMPH NODE BIOPSY WITH RADIOACTIVE SEED LOCALIZATION AND RIGHT AXILLARY SENTINEL LYMPH NODE BIOPSY, FROZEN SECTION AND POSSIBLE COMPLETION AXILLARY LYMPH NODE DISSECTION INJECT BLUE DYE RIGHT BREAST;  Surgeon: Fanny Skates, MD;  Location: [place];  Service: General;  Laterality: Right;   PORT-A-CATH REMOVAL Left 07/26/2019   Procedure: REMOVAL PORT-A-CATH;  Surgeon: Irene Limbo, MD;  Location: [place];  Service: Plastics;  Laterality: Left;   PORTACATH PLACEMENT Left 04/13/2018   Procedure: INSERTION PORT-A-CATH WITH [place];  Surgeon: Fanny Skates, MD;  Location: [place];  Service: General;  Laterality: Left;   REMOVAL OF TISSUE EXPANDER AND PLACEMENT OF IMPLANT Right 07/26/2019   Procedure: REMOVAL OF TISSUE EXPANDER AND PLACEMENT OF IMPLANT;  Surgeon: Irene Limbo, MD;  Location: [place];  Service: Plastics;  Laterality: Right;   UTERINE FIBROID SURGERY  I have reviewed the social history and family history with the patient and they are unchanged from previous note.  ALLERGIES:  is allergic to metrogel [metronidazole], other, paxil [paroxetine hcl], sulfamethoxazole, zomig [zolmitriptan], and prednisone.  MEDICATIONS:  Current Outpatient Medications  Medication Sig Dispense Refill   ALPRAZolam (XANAX) 0.5 MG tablet TAKE 1 TABLET(0.5 MG) BY MOUTH AT BEDTIME AS NEEDED FOR ANXIETY OR SLEEP 30 tablet 0   Ascorbic Acid (VITAMIN C) 1000 MG tablet Take 2,000 mg by mouth daily.     atenolol (TENORMIN) 50 MG tablet Take 50 mg by mouth at bedtime.     Cholecalciferol (VITAMIN D3) 50 MCG (2000 UT) capsule Take 2,000 Units by mouth daily.     exemestane (AROMASIN) 25 MG tablet Take 1 tablet (25 mg total) by mouth daily after breakfast. 90 tablet 1   HYDROcodone-acetaminophen (NORCO/VICODIN) 5-325 MG tablet Take 1-2 tablets by mouth  every 6 (six) hours as needed. (Patient not taking: Reported on 12/06/2020) 10 tablet 0   ibuprofen (ADVIL) 400 MG tablet Take 400 mg by mouth 3 (three) times daily as needed.     meloxicam (MOBIC) 15 MG tablet Take 1 tablet (15 mg total) by mouth daily. Take with food 15 tablet 0   potassium chloride SA (KLOR-CON) 20 MEQ tablet Take 1 tablet (20 mEq total) by mouth daily. 30 tablet 3   valACYclovir (VALTREX) 500 MG tablet valacyclovir 500 mg tablet  TAKE 1 TABLET BY MOUTH TWICE DAILY     venlafaxine XR (EFFEXOR-XR) 150 MG 24 hr capsule TAKE 1 CAPSULE(150 MG) BY MOUTH DAILY WITH BREAKFAST 30 capsule 5   Zinc 50 MG TABS Take 1 tablet by mouth daily.     No current facility-administered medications for this visit.    PHYSICAL EXAMINATION: ECOG PERFORMANCE STATUS: 1 - Symptomatic but completely ambulatory  There were no vitals filed for this visit. There were no vitals filed for this visit.  Patient appears well over the phone. Voice is strong, speech is clear. Mood/affect appear normal. No cough or conversational dyspnea   LABORATORY DATA:  No lab data for this visit     RADIOGRAPHIC STUDIES: I have personally reviewed the radiological images as listed and agreed with the findings in the report. No results found.   ASSESSMENT & PLAN: 55 yo female   1. Right posterior rib/flank pain, lower legs, diffuse body aches -After about a year and a half on exemestane, she developed the above side effects which resolved after stopping exemestane.   -We rechallenged tamoxifen, she did not tolerate and stopped after 2 weeks due to fatigue/lethargy, indigestion, and lower body leg/knee/ankle/foot pain -Resumed exemestane 12/26/2020, doing well thus far  2. Basal cell skin cancer -Recently removed from her chest by Dr. Anabel Bene -She will follow-up with dermatology soon for full head to toe exam -Cautioned her to avoid direct sunlight, use skin protective barriers and avoid peak sun  exposure  3.Malignant neoplasm of upper-inner quadrant of right breast in female, invasive ductal carcinoma, cT2N1M0 stage IB, ER+/PR+/HER2+, G3  -Diagnosed 02/2018, s/p neoadjuvant TCHP, R mastectomy/ALND, and adjuvant radiation. She completed 1 year maintenance herceptin/perjeta in 03/2019 and Neratinib 08/2019 - 2/022. -she started anti-estrogen therapy 12/2018, did not tolerate tamoxifen, anastrozole, or letrozole. On exemestane since 04/2019 tolerating well until 10/2020 -Try tamoxifen again briefly but tolerated poorly and resumed exemestane in 12/2020 -s/p reconstruction by Dr. Iran Planas 07/26/19 -last visit 08/30/20 showed palpable deep nodule RUQ right breast, US showed benign cyst. Otherwise doing well -no  evidence of recurrence, continue surveillance Next routine surveillance follow-up 01/2021   4. Anxiety -She has history of anxiety, currently on Xanax as needed  Disposition: Claire Guzman appears stable.  She did not tolerate tamoxifen rechallenge, experienced fatigue/lethargy, severe indigestion, and bone pain in the lower body.  Side effects resolved off tamoxifen except foot pain.  I recommend for her to follow-up with her foot doctor.  She resumed exemestane and is tolerating well without significant side effects at this time.   Next lab and routine surveillance visit 02/20/2021 with Dr. Burr Medico as scheduled.  I discussed the assessment and treatment plan with the patient. The patient was provided an opportunity to ask questions and all were answered. The patient agreed with the plan and demonstrated an understanding of the instructions. The patient was advised to call back or seek an in-person evaluation if the symptoms worsen or if the condition fails to improve as anticipated.   Total non-face-to-face encounter time was 10 minutes.     Alla Feeling, NP 01/03/21

## 2021-01-07 ENCOUNTER — Telehealth: Payer: Self-pay | Admitting: Podiatry

## 2021-01-07 NOTE — Telephone Encounter (Signed)
Pt called  and stated she would like a refill on the anti-inflammatory medication until she can get in for her appointment on Thursday.

## 2021-01-08 NOTE — Telephone Encounter (Signed)
Wait until Thursday. Can take ibuprophen

## 2021-01-10 ENCOUNTER — Telehealth: Payer: Self-pay | Admitting: Podiatry

## 2021-01-10 ENCOUNTER — Ambulatory Visit: Payer: Medicaid Other | Admitting: Podiatry

## 2021-01-10 ENCOUNTER — Encounter: Payer: Self-pay | Admitting: Podiatry

## 2021-01-10 ENCOUNTER — Other Ambulatory Visit: Payer: Self-pay

## 2021-01-10 DIAGNOSIS — M778 Other enthesopathies, not elsewhere classified: Secondary | ICD-10-CM

## 2021-01-10 DIAGNOSIS — M722 Plantar fascial fibromatosis: Secondary | ICD-10-CM | POA: Diagnosis not present

## 2021-01-10 MED ORDER — TRIAMCINOLONE ACETONIDE 10 MG/ML IJ SUSP
20.0000 mg | Freq: Once | INTRAMUSCULAR | Status: AC
  Administered 2021-01-10: 20 mg

## 2021-01-10 MED ORDER — PREDNISONE 10 MG PO TABS: ORAL_TABLET | ORAL | 0 refills | Status: DC

## 2021-01-10 NOTE — Telephone Encounter (Signed)
Patient calling to inform Dr. Paulla Dolly that she is allergic to prednisone and will need another type of medication. Please advise.

## 2021-01-10 NOTE — Progress Notes (Signed)
Subjective:   Patient ID: Claire Guzman, female   DOB: 55 y.o.   MRN: [id number]   HPI Patient presents with severe pain in the bottom of the right heel and in the left forefoot states both areas are quite inflamed currently in her feet in general feels inflamed   ROS      Objective:  Physical Exam  Neurovascular status intact with inflammation of the plantar fascial insertion right at the tendon bone interface with inflammation of the forefoot left with fluid buildup around the lesser MPJs second and third     Assessment:  Acute Planter fasciitis right inflammatory capsulitis left     Plan:  H&P reviewed both condition and I did place on a Sterapred DS 12-day Dosepak and today I did sterile prep injected the right plantar fashion 3 mg Kenalog 5 mg Xylocaine and the left second MPJ 3 mg Dexasone Kenalog 5 mg Xylocaine advised on supportive therapy and reappoint to recheck

## 2021-01-10 NOTE — Telephone Encounter (Signed)
Hold off on any medicine and hopefully respond to injection

## 2021-01-27 ENCOUNTER — Other Ambulatory Visit: Payer: Self-pay | Admitting: Hematology

## 2021-01-27 DIAGNOSIS — F419 Anxiety disorder, unspecified: Secondary | ICD-10-CM

## 2021-01-28 ENCOUNTER — Encounter: Payer: Self-pay | Admitting: Hematology

## 2021-02-14 ENCOUNTER — Telehealth: Payer: Self-pay | Admitting: Hematology

## 2021-02-14 ENCOUNTER — Other Ambulatory Visit: Payer: Self-pay | Admitting: Podiatry

## 2021-02-14 NOTE — Telephone Encounter (Signed)
Rescheduled upcoming appointment. Patient is aware of changes.

## 2021-02-15 ENCOUNTER — Inpatient Hospital Stay: Payer: Medicaid Other | Attending: Hematology

## 2021-02-15 ENCOUNTER — Encounter: Payer: Self-pay | Admitting: Hematology

## 2021-02-15 ENCOUNTER — Inpatient Hospital Stay: Payer: Medicaid Other | Admitting: Hematology

## 2021-02-15 ENCOUNTER — Other Ambulatory Visit: Payer: Self-pay

## 2021-02-15 VITALS — BP 149/83 | HR 65 | Temp 98.5°F | Resp 17 | Wt 166.6 lb

## 2021-02-15 DIAGNOSIS — Z85828 Personal history of other malignant neoplasm of skin: Secondary | ICD-10-CM | POA: Diagnosis not present

## 2021-02-15 DIAGNOSIS — F429 Obsessive-compulsive disorder, unspecified: Secondary | ICD-10-CM | POA: Insufficient documentation

## 2021-02-15 DIAGNOSIS — Z79811 Long term (current) use of aromatase inhibitors: Secondary | ICD-10-CM | POA: Diagnosis not present

## 2021-02-15 DIAGNOSIS — R519 Headache, unspecified: Secondary | ICD-10-CM | POA: Insufficient documentation

## 2021-02-15 DIAGNOSIS — F419 Anxiety disorder, unspecified: Secondary | ICD-10-CM | POA: Diagnosis not present

## 2021-02-15 DIAGNOSIS — Z808 Family history of malignant neoplasm of other organs or systems: Secondary | ICD-10-CM | POA: Insufficient documentation

## 2021-02-15 DIAGNOSIS — Z9011 Acquired absence of right breast and nipple: Secondary | ICD-10-CM | POA: Insufficient documentation

## 2021-02-15 DIAGNOSIS — Z170 Estrogen receptor positive status [ER+]: Secondary | ICD-10-CM

## 2021-02-15 DIAGNOSIS — Z800 Family history of malignant neoplasm of digestive organs: Secondary | ICD-10-CM | POA: Diagnosis not present

## 2021-02-15 DIAGNOSIS — M810 Age-related osteoporosis without current pathological fracture: Secondary | ICD-10-CM | POA: Insufficient documentation

## 2021-02-15 DIAGNOSIS — G4700 Insomnia, unspecified: Secondary | ICD-10-CM | POA: Diagnosis not present

## 2021-02-15 DIAGNOSIS — C50211 Malignant neoplasm of upper-inner quadrant of right female breast: Secondary | ICD-10-CM

## 2021-02-15 LAB — CBC WITH DIFFERENTIAL (CANCER CENTER ONLY)
Abs Immature Granulocytes: 0.01 10*3/uL (ref 0.00–0.07)
Basophils Absolute: 0 10*3/uL (ref 0.0–0.1)
Basophils Relative: 0 %
Eosinophils Absolute: 0.1 10*3/uL (ref 0.0–0.5)
Eosinophils Relative: 3 %
HCT: 37.1 % (ref 36.0–46.0)
Hemoglobin: 13.3 g/dL (ref 12.0–15.0)
Immature Granulocytes: 0 %
Lymphocytes Relative: 38 %
Lymphs Abs: 1.7 10*3/uL (ref 0.7–4.0)
MCH: 32.4 pg (ref 26.0–34.0)
MCHC: 35.8 g/dL (ref 30.0–36.0)
MCV: 90.5 fL (ref 80.0–100.0)
Monocytes Absolute: 0.2 10*3/uL (ref 0.1–1.0)
Monocytes Relative: 4 %
Neutro Abs: 2.5 10*3/uL (ref 1.7–7.7)
Neutrophils Relative %: 55 %
Platelet Count: 168 10*3/uL (ref 150–400)
RBC: 4.1 MIL/uL (ref 3.87–5.11)
RDW: 12.2 % (ref 11.5–15.5)
WBC Count: 4.5 10*3/uL (ref 4.0–10.5)
nRBC: 0 % (ref 0.0–0.2)

## 2021-02-15 LAB — CMP (CANCER CENTER ONLY)
ALT: 16 U/L (ref 0–44)
AST: 19 U/L (ref 15–41)
Albumin: 4.6 g/dL (ref 3.5–5.0)
Alkaline Phosphatase: 68 U/L (ref 38–126)
Anion gap: 12 (ref 5–15)
BUN: 12 mg/dL (ref 6–20)
CO2: 28 mmol/L (ref 22–32)
Calcium: 9.8 mg/dL (ref 8.9–10.3)
Chloride: 103 mmol/L (ref 98–111)
Creatinine: 0.53 mg/dL (ref 0.44–1.00)
GFR, Estimated: >60 mL/min (ref >60)
Glucose, Bld: 136 mg/dL — ABNORMAL HIGH (ref 70–99)
Potassium: 3.5 mmol/L (ref 3.5–5.1)
Sodium: 143 mmol/L (ref 135–145)
Total Bilirubin: 0.6 mg/dL (ref 0.3–1.2)
Total Protein: 7.5 g/dL (ref 6.5–8.1)

## 2021-02-15 LAB — MAGNESIUM: Magnesium: 1.8 mg/dL (ref 1.7–2.4)

## 2021-02-15 MED ORDER — VENLAFAXINE HCL ER 150 MG PO CP24: 150.0000 mg | ORAL_CAPSULE | Freq: Every day | ORAL | 5 refills | Status: DC

## 2021-02-15 NOTE — Progress Notes (Signed)
[name]   [phone]) [phone] Fax:(336) [phone]   Clinic Follow up Note   Patient Care Team: Lujean Amel, MD as PCP - General (Family Medicine) Fanny Skates, MD as Consulting Physician (General Surgery) Truitt Merle, MD as Consulting Physician (Hematology) Eppie Gibson, MD as Attending Physician (Radiation Oncology) Mauro Kaufmann, RN as Oncology Nurse Navigator Rockwell Germany, RN as Oncology Nurse Navigator  Date of Service:  02/15/2021  CHIEF COMPLAINT: F/u for right breast cancer  SUMMARY OF ONCOLOGIC HISTORY: Oncology History Overview Note  Cancer Staging Malignant neoplasm of upper-inner quadrant of right breast in female, estrogen receptor positive ([place]) Staging form: Breast, AJCC 8th Edition - Clinical stage from 03/08/2018: Stage IB (cT2, cN1, cM0, G3, ER+, PR+, HER2+) - Signed by Truitt Merle, MD on 03/16/2018      Malignant neoplasm of upper-inner quadrant of right breast in female, estrogen receptor positive ([place])  02/24/2018 Mammogram   screening mammogram on 02/24/2018 that was benign   03/04/2018 Mammogram   diagnostic mammogram on 03/04/2018 due to a palpable mass on her right breast. Diagnostic mammogram, breast US revealed and biopsy revealed suspicious mass in the right breast at 2:30 at the palpable site of concern and one suspicious right axillary LN.   03/08/2018 Cancer Staging   Staging form: Breast, AJCC 8th Edition - Clinical stage from 03/08/2018: Stage IB (cT2, cN1, cM0, G3, ER+, PR+, HER2+) - Signed by Truitt Merle, MD on 03/16/2018    03/08/2018 Receptors her2   Her2 positive, ER 90% PR 5% and Ki67 60%   03/08/2018 Pathology Results   Diagnosis 1. Breast, right, needle core biopsy, 2:30 - INVASIVE DUCTAL CARCINOMA, GRADE 3. - LYMPHOVASCULAR SPACE INVOLVEMENT BY TUMOR. 2. Lymph node, needle/core biopsy, right axilla - METASTATIC CARCINOMA INVOLVING SCANT LYMPHOID TISSUE.    03/10/2018 Initial Diagnosis   Malignant neoplasm of  upper-inner quadrant of right breast in female, estrogen receptor positive ([place])    03/20/2018 Imaging   MRI brain 03/20/18 IMPRESSION: No evidence of metastatic disease.  Normal appearance of brain. Slightly heterogeneous marrow pattern of the upper cervical spine, nonspecific but likely benign, especially in the setting of early stage disease.     03/24/2018 Echocardiogram   Baseline ECHO 03/24/18 Study Conclusions - Left ventricle: The cavity size was normal. Wall thickness was   increased in a pattern of mild LVH. Systolic function was normal.   The estimated ejection fraction was in the range of 60% to 65%.   Wall motion was normal; there were no regional wall motion   abnormalities. Doppler parameters are consistent with abnormal   left ventricular relaxation (grade 1 diastolic dysfunction). - Right ventricle: The cavity size was mildly dilated. Wall   thickness was normal.    03/25/2018 Imaging   MRI Breast B/l 03/25/18 IMPRESSION: 1. The patient's primary malignancy in the posterior right breast measures 3.2 x 2 x 2.6 cm with apparent invasion of the underlying pectoralis muscle. Numerous surrounding abnormal enhancing masses, consistent with satellite lesions, extend from 11 o'clock in the right breast into the inferolateral right breast with a total span of suspected disease measuring 4.5 x 4.6 x 6.4 cm in AP, transverse, and craniocaudal dimension. The suspected extent of disease involves the superior and inferolateral quadrants. The primary malignancy also extends just across midline into the superior medial quadrant. 2. No MRI evidence of malignancy in the left breast. 3. Known metastatic noted in the right axilla.    03/25/2018 Imaging   Whole body bone  scan 03/25/18 IMPRESSION: No scintigraphic evidence skeletal metastasis.    03/25/2018 Imaging   CT CAP W Contrast 03/25/18 IMPRESSION: 1. Mass within the deep aspect of the right breast along the pectoralis  muscle compatible with primary breast malignancy. 2. There is suggestion of two additional possible masses within the lateral aspect of the right breast versus nodular appearing breast tissue. Recommend dedicated evaluation with bilateral breast MRI. 3. Mildly thickened right axillary lymph node compatible with metastatic adenopathy, recently biopsied. 4. Multiple bilateral pulmonary nodules are indeterminate, potentially sequelae of prior infectious/inflammatory process. Recommend attention on follow-up. Consider follow-up CT in 6 months. 5. Portal venous gas is demonstrated within the left hepatic lobe. Additionally, there is wall thickening of the cecum and ascending colon with small amount of gas within the pericolonic vasculature. Constellation of findings is indeterminate in etiology however may be secondary to colitis at this location with considerations including infectious, inflammatory or ischemic etiologies. Correlate for recent procedure. If patient is not up-to-date for colonic screening, recommend further evaluation with colonoscopy to exclude the possibility of colonic mass within the cecum/ascending colon. 6. These results were called by telephone at the time of interpretation on 03/25/2018 at 9:31 am to Dr. Truitt Merle , who verbally acknowledged these results.    03/30/2018 - 04/08/2019 Chemotherapy   Neoadjuvant TCHP every 3 weeks starting 03/30/18. Completed 6 cycles of TCHP on 07/23/18.  Continued with maintenance Herceptin/Perjeta q3weeks to complete 1 year.    04/09/2018 Genetic Testing   Negative genetic testing on the [facility] Common Hereditary Cancers Panel. The Common Hereditary Cancers Panel offered by [facility] includes sequencing and/or deletion duplication testing of the following 47 genes: APC, ATM, AXIN2, BARD1, BMPR1A, BRCA1, BRCA2, BRIP1, CDH1, CDKN2A (p14ARF), CDKN2A (p16INK4a), CKD4, CHEK2, CTNNA1, DICER1, EPCAM (Deletion/duplication testing only), GREM1 (promoter  region deletion/duplication testing only), KIT, MEN1, MLH1, MSH2, MSH3, MSH6, MUTYH, NBN, NF1, NHTL1, PALB2, PDGFRA, PMS2, POLD1, POLE, PTEN, RAD50, RAD51C, RAD51D, SDHB, SDHC, SDHD, SMAD4, SMARCA4. STK11, TP53, TSC1, TSC2, and VHL.  The following genes were evaluated for sequence changes only: SDHA and HOXB13 c.251G>A variant only.   Genetic testing did detect a Variant of Unknown Significance (VUS) in the POLD1 gene called c.985C>T (p.Pro329Ser). At this time, it is unknown if this variant is associated with increased cancer risk or if this is a normal finding, but most variants such as this get reclassified to being inconsequential. It should not be used to make medical management decisions.   The report date is 04/09/2018.    07/24/2018 Breast MRI   IMPRESSION: 1. Resolution of the enhancing mass (known cancer) and small satellite lesions previously seen in the right breast following chemotherapy.   2. The metastatic lymph node in the right axilla has decreased in size.   3.  No MRI evidence of left breast malignancy.    09/14/2018 Surgery   RIGHT NIPPLE SPARING MASTECTOMY WITH TARGETED DEEP RIGHT AXILLARY LYMPH NODE BIOPSY WITH RADIOACTIVE SEED LOCALIZATION AND RIGHT AXILLARY SENTINEL LYMPH NODE BIOPSY by Dr. Dalbert Batman  09/14/18     09/14/2018 Pathology Results   Diagnosis 1. Lymph node, sentinel, biopsy, right axillary with radioactive seed - ONE OF ONE LYMPH NODES NEGATIVE FOR CARCINOMA (0/1). - MILD TREATMENT EFFECT. - BIOPSY SITE. 2. Lymph node, sentinel, biopsy, right axillary - ONE OF ONE LYMPH NODES NEGATIVE FOR CARCINOMA (0/1). 3. Lymph node, sentinel, biopsy, right - ONE OF ONE LYMPH NODES NEGATIVE FOR CARCINOMA (0/1). 4. Lymph node, sentinel, biopsy, right - ONE OF ONE LYMPH  NODES NEGATIVE FOR CARCINOMA (0/1). 5. Lymph node, sentinel, biopsy, right - ONE OF ONE LYMPH NODES NEGATIVE FOR CARCINOMA (0/1). 6. Lymph node, sentinel, biopsy, right - ONE OF ONE LYMPH NODES  NEGATIVE FOR CARCINOMA (0/1). 7. Lymph node, sentinel, biopsy, right - ONE OF ONE LYMPH NODES NEGATIVE FOR CARCINOMA (0/1). 8. Lymph node, sentinel, biopsy, right - ONE OF ONE LYMPH NODES NEGATIVE FOR CARCINOMA (0/1). 9. Lymph node, sentinel, biopsy, right - ONE OF ONE LYMPH NODES NEGATIVE FOR CARCINOMA (0/1). 10. Lymph node, sentinel, biopsy, right - ONE OF ONE LYMPH NODES NEGATIVE FOR CARCINOMA (0/1). 11. Lymph node, sentinel, biopsy, right - ONE OF ONE LYMPH NODES NEGATIVE FOR CARCINOMA (0/1). 12. Lymph node, sentinel, biopsy, right - ONE OF ONE LYMPH NODES NEGATIVE FOR CARCINOMA (0/1). 13. Nipple Biopsy, right - BENIGN NIPPLE TISSUE. 14. Breast, simple mastectomy, right - NO RESIDUAL CARCINOMA IDENTIFIED. - TREATMENT EFFECT. - FIBROCYSTIC CHANGE. - ONE OF ONE LYMPH NODES NEGATIVE FOR CARCINOMA (0/1). - SEE ONCOLOGY TABLE.    09/14/2018 Cancer Staging   Staging form: Breast, AJCC 8th Edition - Pathologic stage from 09/14/2018: No Stage Recommended (ypT0, pN0, cM0, GX, ER: Not Assessed, PR: Not Assessed, HER2: Not Assessed) - Signed by Truitt Merle, MD on 09/23/2018    11/03/2018 - 12/10/2018 Radiation Therapy   Adjuvant Radiation with Dr. Isidore Moos     12/27/2018 -  Anti-estrogen oral therapy   Start Tamoxifen 19m daily in 12/27/2018 stopped after 1 week due to poor toleration. She started anastrozole on 02/04/19 but stopped on 02/14/19 due to worsening muscle aches. She tried Letrozole but did not tolerate. She was switched to Exemestane in late 04/2019.    01/04/2019 Imaging   CT chest  IMPRESSION: 1. Stable tiny subpleural and perifissural nodules bilaterally. No suspicious pulmonary nodule or mass. 2. No focal airspace disease in the right lung. 3.  Aortic Atherosclerois (ICD10-170.0)     03/08/2019 Imaging   CT AP W Contrast 03/08/19  IMPRESSION: 1. No metastatic disease in the abdomen pelvis. 2. No evidence adverse immunotherapy therapy reaction.   03/08/2019 Imaging   Whole  body bone scan 03/08/19  IMPRESSION: No definite scintigraphic evidence of osseous metastases.   08/29/2019 - 08/29/2020 Chemotherapy   Started Nerlynx 2443mdaily with budesonide on 08/29/19. Reduced to 20087mn late 08/2019. Completed on 08/29/20        CURRENT THERAPY:  -Tamoxifen 19m8mily starting 12/27/2018 stopped after 1 week due to poor toleration. She started anastrozole on 02/04/19 but stopped on 02/14/19 due to worsening muscle aches. She tried Letrozole but did not tolerate. She was switched to Exemestane in late 04/2019.    INTERVAL HISTORY:  Claire Guerrierihere for an urgent visit due to a new lump in her right breast. She presents to the clinic alone. She has been under a lot stress lately due to her family issues She had basal cell skin cancer removed lately in front chemo, she is concerned about a lesion on skull, she will f/u with her dermatologist  No new pain except headache, intermittent in the front head, in the past week, no blurry vision or double vision, her mammory and concentration has been worse  No chest pain or GI issues  she is tolerating exemestane well, no joint pain or other issue s Weight is stable    MEDICAL HISTORY:  Past Medical History:  Diagnosis Date   Abdominal pain, unspecified site    Anxiety state, unspecified    Complication of  anesthesia    convulsions - when she has appendectomy about 12 years ago at [facility]   Contact dermatitis and other eczema, due to unspecified cause    Essential hypertension, benign    Family history of colon cancer    Family history of melanoma    Family history of prostate cancer    GERD (gastroesophageal reflux disease)    Hematuria, unspecified    Hypertension    Lower back pain    Mixed hyperlipidemia    Rosacea    rt breast ca dx'd 01/2018   R breast cancer   Unspecified symptom associated with female genital organs     SURGICAL HISTORY: Past Surgical History:  Procedure Laterality Date    BREAST RECONSTRUCTION WITH PLACEMENT OF TISSUE EXPANDER AND ALLODERM Right 09/14/2018   Procedure: BREAST RECONSTRUCTION WITH PLACEMENT OF TISSUE EXPANDER AND ALLODERM;  Surgeon: Irene Limbo, MD;  Location: [facility];  Service: Plastics;  Laterality: Right;   CESAREAN SECTION     DIAGNOSTIC LAPAROSCOPY     EYE SURGERY     Lasik   LAPAROSCOPIC APPENDECTOMY     LIPOSUCTION WITH LIPOFILLING Right 07/26/2019   Procedure: LIPOFILLING from abdomen to R chest;  Surgeon: Irene Limbo, MD;  Location: [facility];  Service: Plastics;  Laterality: Right;   MASTECTOMY WITH RADIOACTIVE SEED GUIDED EXCISION AND AXILLARY SENTINEL LYMPH NODE BIOPSY Right 09/14/2018   Procedure: RIGHT NIPPLE SPARING MASTECTOMY WITH TARGETED DEEP RIGHT AXILLARY LYMPH NODE BIOPSY WITH RADIOACTIVE SEED LOCALIZATION AND RIGHT AXILLARY SENTINEL LYMPH NODE BIOPSY, FROZEN SECTION AND POSSIBLE COMPLETION AXILLARY LYMPH NODE DISSECTION INJECT BLUE DYE RIGHT BREAST;  Surgeon: Fanny Skates, MD;  Location: [facility];  Service: General;  Laterality: Right;   PORT-A-CATH REMOVAL Left 07/26/2019   Procedure: REMOVAL PORT-A-CATH;  Surgeon: Irene Limbo, MD;  Location: [facility];  Service: Plastics;  Laterality: Left;   PORTACATH PLACEMENT Left 04/13/2018   Procedure: INSERTION PORT-A-CATH WITH [place];  Surgeon: Fanny Skates, MD;  Location: [facility];  Service: General;  Laterality: Left;   REMOVAL OF TISSUE EXPANDER AND PLACEMENT OF IMPLANT Right 07/26/2019   Procedure: REMOVAL OF TISSUE EXPANDER AND PLACEMENT OF IMPLANT;  Surgeon: Irene Limbo, MD;  Location: [facility];  Service: Plastics;  Laterality: Right;   UTERINE FIBROID SURGERY      I have reviewed the social history and family history with the patient and they are unchanged from previous note.  ALLERGIES:  is allergic to metrogel [metronidazole], other, paxil [paroxetine hcl], sulfamethoxazole, zomig  [zolmitriptan], and prednisone.  MEDICATIONS:  Current Outpatient Medications  Medication Sig Dispense Refill   ALPRAZolam (XANAX) 0.5 MG tablet TAKE 1 TABLET(0.5 MG) BY MOUTH AT BEDTIME AS NEEDED FOR ANXIETY OR SLEEP 30 tablet 0   Ascorbic Acid (VITAMIN C) 1000 MG tablet Take 2,000 mg by mouth daily.     atenolol (TENORMIN) 50 MG tablet Take 50 mg by mouth at bedtime.     Cholecalciferol (VITAMIN D3) 50 MCG (2000 UT) capsule Take 2,000 Units by mouth daily.     exemestane (AROMASIN) 25 MG tablet Take 1 tablet (25 mg total) by mouth daily after breakfast. 90 tablet 1   HYDROcodone-acetaminophen (NORCO/VICODIN) 5-325 MG tablet Take 1-2 tablets by mouth every 6 (six) hours as needed. (Patient not taking: Reported on 12/06/2020) 10 tablet 0   ibuprofen (ADVIL) 400 MG tablet Take 400 mg by mouth 3 (three) times daily as needed.     meloxicam (MOBIC) 15 MG  tablet Take 1 tablet (15 mg total) by mouth daily. Take with food 15 tablet 0   potassium chloride SA (KLOR-CON) 20 MEQ tablet Take 1 tablet (20 mEq total) by mouth daily. 30 tablet 3   predniSONE (DELTASONE) 10 MG tablet 12 day tapering dose 48 tablet 0   valACYclovir (VALTREX) 500 MG tablet valacyclovir 500 mg tablet  TAKE 1 TABLET BY MOUTH TWICE DAILY     venlafaxine XR (EFFEXOR-XR) 150 MG 24 hr capsule TAKE 1 CAPSULE(150 MG) BY MOUTH DAILY WITH BREAKFAST 30 capsule 5   Zinc 50 MG TABS Take 1 tablet by mouth daily.     No current facility-administered medications for this visit.    PHYSICAL EXAMINATION: ECOG PERFORMANCE STATUS: 0 - Asymptomatic  Vitals:   02/15/21 1044  BP: (!) 149/83  Pulse: 65  Resp: 17  Temp: 98.5 F (36.9 C)  SpO2: 100%   Filed Weights   02/15/21 1044  Weight: 166 lb 9.6 oz (75.6 kg)    GENERAL:alert, no distress and comfortable SKIN: skin color, texture, turgor are normal, no rashes or significant lesions EYES: normal, Conjunctiva are pink and non-injected, sclera clear NECK: supple, thyroid normal  size, non-tender, without nodularity LYMPH:  no palpable lymphadenopathy in the cervical, axillary  LUNGS: clear to auscultation and percussion with normal breathing effort HEART: regular rate & rhythm and no murmurs and no lower extremity edema ABDOMEN:abdomen soft, non-tender and normal bowel sounds Musculoskeletal:no cyanosis of digits and no clubbing  NEURO: alert & oriented x 3 with fluent speech, no focal motor/sensory deficits Breasts: Breast inspection showed status post right mastectomy and implant placement.  I can feel pea-sized hard nodule at the edge of implant in the right upper quadrant of right breast, it's unchanged. Palpation of the left breast and axilla revealed no obvious mass that I could appreciate.   LABORATORY DATA:  I have reviewed the data as listed CBC Latest Ref Rng & Units 02/15/2021 11/22/2020 05/04/2020  WBC 4.0 - 10.5 K/uL 4.5 5.2 6.0  Hemoglobin 12.0 - 15.0 g/dL 13.3 13.2 13.6  Hematocrit 36.0 - 46.0 % 37.1 36.2 37.8  Platelets 150 - 400 K/uL 168 182 178     CMP Latest Ref Rng & Units 11/22/2020 07/04/2020 05/04/2020  Glucose 70 - 99 mg/dL 110(H) 103(H) 91  BUN 6 - 20 mg/dL _0 Creatinine 0.44 - 1.00 mg/dL 0.66 0.77 0.77  Sodium 135 - 145 mmol/L 142 143 144  Potassium 3.5 - 5.1 mmol/L 3.3(L) 3.8 3.1(L)  Chloride 98 - 111 mmol/L 103 107 105  CO2 22 - 32 mmol/L 28 28 33(H)  Calcium 8.9 - 10.3 mg/dL 9.3 9.4 9.8  Total Protein 6.5 - 8.1 g/dL 7.6 7.8 7.6  Total Bilirubin 0.3 - 1.2 mg/dL 0.6 0.7 0.8  Alkaline Phos 38 - 126 U/L 81 98 96  AST 15 - 41 U/L _1 ALT 0 - 44 U/L _2 RADIOGRAPHIC STUDIES: I have personally reviewed the radiological images as listed and agreed with the findings in the report. No results found.   ASSESSMENT & PLAN:  Soniyah Mcglory is a 55 y.o. female with    1. Malignant neoplasm of upper-inner quadrant of right breast in female, invasive ductal carcinoma, cT2N1M0 stage IB, ER+/PR+/HER2+, G3,  ypT0N0 -She was diagnosed in 02/2018. She is s/p neoadjuvant chemo of 6 cycles TCHP, right breast mastectomy and axillary lymph node dissection on 09/14/18 and adjuvant radiation. She  completed 1 year maintenance Herceptin/Perjeta in 03/2019.  -She started antiestrogen therapy in 12/2018. She did not tolerate tamoxifen, Anastrozole or Letrozole. She is currently on Exemestane since 04/2019 tolerating much better.  -She underwent reconstruction surgery and PAC removal by Dr. Iran Planas on 07/26/19.  -I started her on anti-HER2 treatment with oral Nerlynx for one year on 08/29/19. She was initially able to titrate up to 269m Nerlynx daily. Due to intermittent diarrhea and worsening anxiety/OCD she reduce Nerlynx to 2017min late 08/2019. she has completed one year therapy. -She is clinically doing well, mild headaches is probably related to her recent stress, she will monitor.  If persists or gets worse, will get a brain MRI. -Exam was unremarkable, lab reviewed, no clinical concern for recurrence -Lab and follow-up in 6 months   2. Anxiety, OCD, insomnia  -I refilled her xanax today   3. Osteoporosis -Her DEXA from 07/2019 showed lowest T-score -2.7 at left total femur. -I discussed she is on Exemestane which can lower her bone density. -Continue Calcium and Vit D once daily.  -To strengthen her bone and reduce risk of bone mets she started Zometa q6m63monthor 2 years on 03/02/20. Next dose in 02/2021.    Plan: -Lab and Zometa infusion in a months -Lab and follow-up in 6 months -She will call me if her headaches persist, I may get a brain MRI  No problem-specific Assessment & Plan notes found for this encounter.   No orders of the defined types were placed in this encounter.  All questions were answered. The patient knows to call the clinic with any problems, questions or concerns. No barriers to learning was detected. The total time spent in the appointment was 30 minutes.     YanTruitt MerleMD 02/15/2021

## 2021-02-16 LAB — CANCER ANTIGEN 27.29: CA 27.29: 22.6 U/mL (ref 0.0–38.6)

## 2021-02-20 ENCOUNTER — Other Ambulatory Visit: Payer: Medicaid Other

## 2021-02-20 ENCOUNTER — Ambulatory Visit: Payer: Medicaid Other | Admitting: Hematology

## 2021-03-01 ENCOUNTER — Other Ambulatory Visit: Payer: Self-pay | Admitting: Hematology

## 2021-03-01 DIAGNOSIS — F419 Anxiety disorder, unspecified: Secondary | ICD-10-CM

## 2021-03-04 ENCOUNTER — Encounter: Payer: Self-pay | Admitting: Hematology

## 2021-03-08 ENCOUNTER — Telehealth: Payer: Self-pay

## 2021-03-08 NOTE — Telephone Encounter (Signed)
This nurse received a message from this patient stating that she is having lower back pain that shoots down her legs.  The pain only happens in the morning and once she is up and moving and taken some advil it subsides. However it has happened every morning for the past week.  Patient continues to have pain in bilateral feet.  This nurse will forward message to provider.

## 2021-03-11 ENCOUNTER — Other Ambulatory Visit: Payer: Self-pay | Admitting: Hematology

## 2021-03-11 MED ORDER — MELOXICAM 15 MG PO TABS: 15.0000 mg | ORAL_TABLET | Freq: Every day | ORAL | 0 refills | Status: DC | PRN

## 2021-03-15 ENCOUNTER — Encounter: Payer: Self-pay | Admitting: Hematology

## 2021-03-18 ENCOUNTER — Other Ambulatory Visit: Payer: Self-pay

## 2021-03-18 ENCOUNTER — Inpatient Hospital Stay: Payer: Medicaid Other | Attending: Hematology

## 2021-03-18 ENCOUNTER — Inpatient Hospital Stay: Payer: Medicaid Other

## 2021-03-18 VITALS — BP 142/88 | HR 66 | Temp 98.6°F | Resp 18 | Wt 167.0 lb

## 2021-03-18 DIAGNOSIS — Z95828 Presence of other vascular implants and grafts: Secondary | ICD-10-CM

## 2021-03-18 DIAGNOSIS — Z923 Personal history of irradiation: Secondary | ICD-10-CM | POA: Diagnosis not present

## 2021-03-18 DIAGNOSIS — Z79811 Long term (current) use of aromatase inhibitors: Secondary | ICD-10-CM | POA: Diagnosis not present

## 2021-03-18 DIAGNOSIS — R109 Unspecified abdominal pain: Secondary | ICD-10-CM | POA: Diagnosis not present

## 2021-03-18 DIAGNOSIS — R918 Other nonspecific abnormal finding of lung field: Secondary | ICD-10-CM | POA: Insufficient documentation

## 2021-03-18 DIAGNOSIS — Z85820 Personal history of malignant melanoma of skin: Secondary | ICD-10-CM | POA: Insufficient documentation

## 2021-03-18 DIAGNOSIS — C50211 Malignant neoplasm of upper-inner quadrant of right female breast: Secondary | ICD-10-CM | POA: Insufficient documentation

## 2021-03-18 DIAGNOSIS — Z79899 Other long term (current) drug therapy: Secondary | ICD-10-CM | POA: Diagnosis not present

## 2021-03-18 DIAGNOSIS — F419 Anxiety disorder, unspecified: Secondary | ICD-10-CM | POA: Diagnosis not present

## 2021-03-18 DIAGNOSIS — M810 Age-related osteoporosis without current pathological fracture: Secondary | ICD-10-CM | POA: Diagnosis present

## 2021-03-18 DIAGNOSIS — Z9221 Personal history of antineoplastic chemotherapy: Secondary | ICD-10-CM | POA: Diagnosis not present

## 2021-03-18 DIAGNOSIS — Z170 Estrogen receptor positive status [ER+]: Secondary | ICD-10-CM | POA: Diagnosis not present

## 2021-03-18 DIAGNOSIS — M545 Low back pain, unspecified: Secondary | ICD-10-CM | POA: Diagnosis present

## 2021-03-18 DIAGNOSIS — C773 Secondary and unspecified malignant neoplasm of axilla and upper limb lymph nodes: Secondary | ICD-10-CM | POA: Diagnosis not present

## 2021-03-18 LAB — CMP (CANCER CENTER ONLY)
ALT: 14 U/L (ref 0–44)
AST: 17 U/L (ref 15–41)
Albumin: 4.3 g/dL (ref 3.5–5.0)
Alkaline Phosphatase: 84 U/L (ref 38–126)
Anion gap: 11 (ref 5–15)
BUN: 12 mg/dL (ref 6–20)
CO2: 26 mmol/L (ref 22–32)
Calcium: 9.9 mg/dL (ref 8.9–10.3)
Chloride: 106 mmol/L (ref 98–111)
Creatinine: 0.7 mg/dL (ref 0.44–1.00)
GFR, Estimated: >60 mL/min (ref >60)
Glucose, Bld: 92 mg/dL (ref 70–99)
Potassium: 3.5 mmol/L (ref 3.5–5.1)
Sodium: 143 mmol/L (ref 135–145)
Total Bilirubin: 0.6 mg/dL (ref 0.3–1.2)
Total Protein: 7.6 g/dL (ref 6.5–8.1)

## 2021-03-18 LAB — CBC WITH DIFFERENTIAL (CANCER CENTER ONLY)
Abs Immature Granulocytes: 0.01 10*3/uL (ref 0.00–0.07)
Basophils Absolute: 0 10*3/uL (ref 0.0–0.1)
Basophils Relative: 0 %
Eosinophils Absolute: 0.2 10*3/uL (ref 0.0–0.5)
Eosinophils Relative: 3 %
HCT: 36.6 % (ref 36.0–46.0)
Hemoglobin: 13.6 g/dL (ref 12.0–15.0)
Immature Granulocytes: 0 %
Lymphocytes Relative: 40 %
Lymphs Abs: 1.9 10*3/uL (ref 0.7–4.0)
MCH: 32.9 pg (ref 26.0–34.0)
MCHC: 37.2 g/dL — ABNORMAL HIGH (ref 30.0–36.0)
MCV: 88.6 fL (ref 80.0–100.0)
Monocytes Absolute: 0.4 10*3/uL (ref 0.1–1.0)
Monocytes Relative: 8 %
Neutro Abs: 2.3 10*3/uL (ref 1.7–7.7)
Neutrophils Relative %: 49 %
Platelet Count: 181 10*3/uL (ref 150–400)
RBC: 4.13 MIL/uL (ref 3.87–5.11)
RDW: 12.2 % (ref 11.5–15.5)
WBC Count: 4.8 10*3/uL (ref 4.0–10.5)
nRBC: 0 % (ref 0.0–0.2)

## 2021-03-18 MED ORDER — ZOLEDRONIC ACID 4 MG/100ML IV SOLN
4.0000 mg | Freq: Once | INTRAVENOUS | Status: AC
  Administered 2021-03-18: 4 mg via INTRAVENOUS
  Filled 2021-03-18: qty 100

## 2021-03-18 MED ORDER — SODIUM CHLORIDE 0.9 % IV SOLN
Freq: Once | INTRAVENOUS | Status: AC
Start: 2021-03-18 — End: 2021-03-18

## 2021-03-18 NOTE — Patient Instructions (Signed)

## 2021-03-23 NOTE — Telephone Encounter (Signed)
Refilled

## 2021-03-25 ENCOUNTER — Telehealth: Payer: Self-pay | Admitting: *Deleted

## 2021-03-25 NOTE — Telephone Encounter (Signed)
Patient called - Lower back pain continues. She is taking meloxicam and tylenol without relief. Pain is causing her legs and feet to ache. She would like Dr. Burr Medico to order an X-ray or MRI of her back to see what is going on.   FYI - She will call and schedule [facility] with [facility] - her MD's machine is not currently working.  Call information routed to Dr. Burr Medico

## 2021-03-27 ENCOUNTER — Other Ambulatory Visit: Payer: Self-pay

## 2021-03-27 ENCOUNTER — Encounter: Payer: Self-pay | Admitting: Nurse Practitioner

## 2021-03-27 ENCOUNTER — Inpatient Hospital Stay: Payer: Medicaid Other | Admitting: Nurse Practitioner

## 2021-03-27 ENCOUNTER — Ambulatory Visit (HOSPITAL_COMMUNITY)
Admission: RE | Admit: 2021-03-27 | Discharge: 2021-03-27 | Disposition: A | Discharge status: Home / Self Care | Payer: Medicaid Other | Source: Ambulatory Visit | Attending: Nurse Practitioner | Admitting: Nurse Practitioner

## 2021-03-27 VITALS — BP 143/86 | HR 78 | Temp 98.9°F | Resp 78 | Wt 168.4 lb

## 2021-03-27 DIAGNOSIS — M79605 Pain in left leg: Secondary | ICD-10-CM | POA: Insufficient documentation

## 2021-03-27 DIAGNOSIS — M79604 Pain in right leg: Secondary | ICD-10-CM

## 2021-03-27 DIAGNOSIS — M810 Age-related osteoporosis without current pathological fracture: Secondary | ICD-10-CM | POA: Diagnosis not present

## 2021-03-27 DIAGNOSIS — M545 Low back pain, unspecified: Secondary | ICD-10-CM

## 2021-03-27 MED ORDER — TRAMADOL HCL 50 MG PO TABS: 50.0000 mg | ORAL_TABLET | Freq: Two times a day (BID) | ORAL | 0 refills | Status: DC | PRN

## 2021-03-27 NOTE — Progress Notes (Signed)
[place]   [phone] [phone]   Clinic Follow up Note   Patient Care Team: Lujean Amel, MD as PCP - General (Family Medicine) Fanny Skates, MD as Consulting Physician (General Surgery) Truitt Merle, MD as Consulting Physician (Hematology) Eppie Gibson, MD as Attending Physician (Radiation Oncology) Mauro Kaufmann, RN as Oncology Nurse Navigator Rockwell Germany, RN as Oncology Nurse Navigator 03/27/2021  CHIEF COMPLAINT: Back pain, history of right breast cancer  SUMMARY OF ONCOLOGIC HISTORY: Oncology History Overview Note  Cancer Staging Malignant neoplasm of upper-inner quadrant of right breast in female, estrogen receptor positive ([place]) Staging form: Breast, AJCC 8th Edition - Clinical stage from 03/08/2018: Stage IB (cT2, cN1, cM0, G3, ER+, PR+, HER2+) - Signed by Truitt Merle, MD on 03/16/2018     Malignant neoplasm of upper-inner quadrant of right breast in female, estrogen receptor positive ([place])  02/24/2018 Mammogram   screening mammogram on 02/24/2018 that was benign   03/04/2018 Mammogram   diagnostic mammogram on 03/04/2018 due to a palpable mass on her right breast. Diagnostic mammogram, breast US revealed and biopsy revealed suspicious mass in the right breast at 2:30 at the palpable site of concern and one suspicious right axillary LN.   03/08/2018 Cancer Staging   Staging form: Breast, AJCC 8th Edition - Clinical stage from 03/08/2018: Stage IB (cT2, cN1, cM0, G3, ER+, PR+, HER2+) - Signed by Truitt Merle, MD on 03/16/2018   03/08/2018 Receptors her2   Her2 positive, ER 90% PR 5% and Ki67 60%   03/08/2018 Pathology Results   Diagnosis 1. Breast, right, needle core biopsy, 2:30 - INVASIVE DUCTAL CARCINOMA, GRADE 3. - LYMPHOVASCULAR SPACE INVOLVEMENT BY TUMOR. 2. Lymph node, needle/core biopsy, right axilla - METASTATIC CARCINOMA INVOLVING SCANT LYMPHOID TISSUE.   03/10/2018 Initial Diagnosis   Malignant neoplasm of upper-inner  quadrant of right breast in female, estrogen receptor positive ([place])   03/20/2018 Imaging   MRI brain 03/20/18 IMPRESSION: No evidence of metastatic disease.  Normal appearance of brain. Slightly heterogeneous marrow pattern of the upper cervical spine, nonspecific but likely benign, especially in the setting of early stage disease.    03/24/2018 Echocardiogram   Baseline ECHO 03/24/18 Study Conclusions - Left ventricle: The cavity size was normal. Wall thickness was   increased in a pattern of mild LVH. Systolic function was normal.   The estimated ejection fraction was in the range of 60% to 65%.   Wall motion was normal; there were no regional wall motion   abnormalities. Doppler parameters are consistent with abnormal   left ventricular relaxation (grade 1 diastolic dysfunction). - Right ventricle: The cavity size was mildly dilated. Wall   thickness was normal.   03/25/2018 Imaging   MRI Breast B/l 03/25/18 IMPRESSION: 1. The patient's primary malignancy in the posterior right breast measures 3.2 x 2 x 2.6 cm with apparent invasion of the underlying pectoralis muscle. Numerous surrounding abnormal enhancing masses, consistent with satellite lesions, extend from 11 o'clock in the right breast into the inferolateral right breast with a total span of suspected disease measuring 4.5 x 4.6 x 6.4 cm in AP, transverse, and craniocaudal dimension. The suspected extent of disease involves the superior and inferolateral quadrants. The primary malignancy also extends just across midline into the superior medial quadrant. 2. No MRI evidence of malignancy in the left breast. 3. Known metastatic noted in the right axilla.   03/25/2018 Imaging   Whole body bone scan 03/25/18 IMPRESSION: No scintigraphic evidence skeletal metastasis.  03/25/2018 Imaging   CT CAP W Contrast 03/25/18 IMPRESSION: 1. Mass within the deep aspect of the right breast along the pectoralis muscle compatible with  primary breast malignancy. 2. There is suggestion of two additional possible masses within the lateral aspect of the right breast versus nodular appearing breast tissue. Recommend dedicated evaluation with bilateral breast MRI. 3. Mildly thickened right axillary lymph node compatible with metastatic adenopathy, recently biopsied. 4. Multiple bilateral pulmonary nodules are indeterminate, potentially sequelae of prior infectious/inflammatory process. Recommend attention on follow-up. Consider follow-up CT in 6 months. 5. Portal venous gas is demonstrated within the left hepatic lobe. Additionally, there is wall thickening of the cecum and ascending colon with small amount of gas within the pericolonic vasculature. Constellation of findings is indeterminate in etiology however may be secondary to colitis at this location with considerations including infectious, inflammatory or ischemic etiologies. Correlate for recent procedure. If patient is not up-to-date for colonic screening, recommend further evaluation with colonoscopy to exclude the possibility of colonic mass within the cecum/ascending colon. 6. These results were called by telephone at the time of interpretation on 03/25/2018 at 9:31 am to Dr. Truitt Merle , who verbally acknowledged these results.   03/30/2018 - 04/08/2019 Chemotherapy   Neoadjuvant TCHP every 3 weeks starting 03/30/18. Completed 6 cycles of TCHP on 07/23/18.  Continued with maintenance Herceptin/Perjeta q3weeks to complete 1 year.    04/09/2018 Genetic Testing   Negative genetic testing on the [employer] Common Hereditary Cancers Panel. The Common Hereditary Cancers Panel offered by [facility] includes sequencing and/or deletion duplication testing of the following 47 genes: APC, ATM, AXIN2, BARD1, BMPR1A, BRCA1, BRCA2, BRIP1, CDH1, CDKN2A (p14ARF), CDKN2A (p16INK4a), CKD4, CHEK2, CTNNA1, DICER1, EPCAM (Deletion/duplication testing only), GREM1 (promoter region  deletion/duplication testing only), KIT, MEN1, MLH1, MSH2, MSH3, MSH6, MUTYH, NBN, NF1, NHTL1, PALB2, PDGFRA, PMS2, POLD1, POLE, PTEN, RAD50, RAD51C, RAD51D, SDHB, SDHC, SDHD, SMAD4, SMARCA4. STK11, TP53, TSC1, TSC2, and VHL.  The following genes were evaluated for sequence changes only: SDHA and HOXB13 c.251G>A variant only.   Genetic testing did detect a Variant of Unknown Significance (VUS) in the POLD1 gene called c.985C>T (p.Pro329Ser). At this time, it is unknown if this variant is associated with increased cancer risk or if this is a normal finding, but most variants such as this get reclassified to being inconsequential. It should not be used to make medical management decisions.   The report date is 04/09/2018.   07/24/2018 Breast MRI   IMPRESSION: 1. Resolution of the enhancing mass (known cancer) and small satellite lesions previously seen in the right breast following chemotherapy.   2. The metastatic lymph node in the right axilla has decreased in size.   3.  No MRI evidence of left breast malignancy.   09/14/2018 Surgery   RIGHT NIPPLE SPARING MASTECTOMY WITH TARGETED DEEP RIGHT AXILLARY LYMPH NODE BIOPSY WITH RADIOACTIVE SEED LOCALIZATION AND RIGHT AXILLARY SENTINEL LYMPH NODE BIOPSY by Dr. Dalbert Batman  09/14/18    09/14/2018 Pathology Results   Diagnosis 1. Lymph node, sentinel, biopsy, right axillary with radioactive seed - ONE OF ONE LYMPH NODES NEGATIVE FOR CARCINOMA (0/1). - MILD TREATMENT EFFECT. - BIOPSY SITE. 2. Lymph node, sentinel, biopsy, right axillary - ONE OF ONE LYMPH NODES NEGATIVE FOR CARCINOMA (0/1). 3. Lymph node, sentinel, biopsy, right - ONE OF ONE LYMPH NODES NEGATIVE FOR CARCINOMA (0/1). 4. Lymph node, sentinel, biopsy, right - ONE OF ONE LYMPH NODES NEGATIVE FOR CARCINOMA (0/1). 5. Lymph node, sentinel, biopsy, right - ONE OF ONE  LYMPH NODES NEGATIVE FOR CARCINOMA (0/1). 6. Lymph node, sentinel, biopsy, right - ONE OF ONE LYMPH NODES NEGATIVE FOR  CARCINOMA (0/1). 7. Lymph node, sentinel, biopsy, right - ONE OF ONE LYMPH NODES NEGATIVE FOR CARCINOMA (0/1). 8. Lymph node, sentinel, biopsy, right - ONE OF ONE LYMPH NODES NEGATIVE FOR CARCINOMA (0/1). 9. Lymph node, sentinel, biopsy, right - ONE OF ONE LYMPH NODES NEGATIVE FOR CARCINOMA (0/1). 10. Lymph node, sentinel, biopsy, right - ONE OF ONE LYMPH NODES NEGATIVE FOR CARCINOMA (0/1). 11. Lymph node, sentinel, biopsy, right - ONE OF ONE LYMPH NODES NEGATIVE FOR CARCINOMA (0/1). 12. Lymph node, sentinel, biopsy, right - ONE OF ONE LYMPH NODES NEGATIVE FOR CARCINOMA (0/1). 13. Nipple Biopsy, right - BENIGN NIPPLE TISSUE. 14. Breast, simple mastectomy, right - NO RESIDUAL CARCINOMA IDENTIFIED. - TREATMENT EFFECT. - FIBROCYSTIC CHANGE. - ONE OF ONE LYMPH NODES NEGATIVE FOR CARCINOMA (0/1). - SEE ONCOLOGY TABLE.   09/14/2018 Cancer Staging   Staging form: Breast, AJCC 8th Edition - Pathologic stage from 09/14/2018: No Stage Recommended (ypT0, pN0, cM0, GX, ER: Not Assessed, PR: Not Assessed, HER2: Not Assessed) - Signed by Truitt Merle, MD on 09/23/2018   11/03/2018 - 12/10/2018 Radiation Therapy   Adjuvant Radiation with Dr. Isidore Moos    12/27/2018 -  Anti-estrogen oral therapy   Start Tamoxifen 87m daily in 12/27/2018 stopped after 1 week due to poor toleration. She started anastrozole on 02/04/19 but stopped on 02/14/19 due to worsening muscle aches. She tried Letrozole but did not tolerate. She was switched to Exemestane in late 04/2019.    01/04/2019 Imaging   CT chest  IMPRESSION: 1. Stable tiny subpleural and perifissural nodules bilaterally. No suspicious pulmonary nodule or mass. 2. No focal airspace disease in the right lung. 3.  Aortic Atherosclerois (ICD10-170.0)     03/08/2019 Imaging   CT AP W Contrast 03/08/19  IMPRESSION: 1. No metastatic disease in the abdomen pelvis. 2. No evidence adverse immunotherapy therapy reaction.   03/08/2019 Imaging   Whole body bone scan 03/08/19   IMPRESSION: No definite scintigraphic evidence of osseous metastases.   08/29/2019 - 08/29/2020 Chemotherapy   Started Nerlynx 2443mdaily with budesonide on 08/29/19. Reduced to 20043mn late 08/2019. Completed on 08/29/20       CURRENT THERAPY:  1.Antiestrogen  -Tamoxifen 62m22mily starting 12/27/2018 stopped after 1 week due to poor toleration.  -Started anastrozole on 02/04/19 but stopped on 02/14/19 due to worsening muscle aches.  -Tried Letrozole but did not tolerate.  -Switched to Exemestane in late 04/2019.  Stopped 11/22/20 due to worsening joint pain -Re-challenged Tamoxifen 10 mg BID 12/10/20 - 12/20/20, stopped due to fatigue, lethargy, indigestion, bone pain in legs, knees, ankles, feet -Resumed exemestane 12/26/20    2. Nerlynx 240mg49mly with budesonide on 08/29/19. Reduced to 200mg 62mate 08/2019, completed 08/2020  3. Zometa q6 months x2 years, 08/2020, 03/18/2021    INTERVAL HISTORY: Ms. HedricPeifernts for urgent visit for back pain.  She was last seen by Dr. Feng 7Burr Medico2022.  Shortly after that she developed low back pain that began as an intermittent ache.  She denies injury or strain.  She does lift her 2-1/2-52-1/2old niece, does all the housework, and has a new dog since May but none of these are new.  Pain has worsened over time, now a constant "bad ache" that wraps around her sides and down the front of both legs.  Denies numbness/tingling, loss of bladder or bowel function, urinary retention.  Pain resolves at  night then gets worse as the day progresses.  She received second Zometa on 03/18/2021, pain may have worsened around that time but not sure.  She went off exemestane for a week, pain improved initially but then started again while off AI so she restarted back.  Mobic and Tylenol are only partially effective.  She took half of husband's oxycodone which made her itch but was more effective.  Denies other changes such as increased fatigue, unintentional weight loss.  AI related hot  flashes are tolerable.  All other systems were reviewed with the patient and are negative.  MEDICAL HISTORY:  Past Medical History:  Diagnosis Date   Abdominal pain, unspecified site    Anxiety state, unspecified    Complication of anesthesia    convulsions - when she has appendectomy about 12 years ago at [facility]   Contact dermatitis and other eczema, due to unspecified cause    Essential hypertension, benign    Family history of colon cancer    Family history of melanoma    Family history of prostate cancer    GERD (gastroesophageal reflux disease)    Hematuria, unspecified    Hypertension    Lower back pain    Mixed hyperlipidemia    Rosacea    rt breast ca dx'd 01/2018   R breast cancer   Unspecified symptom associated with female genital organs     SURGICAL HISTORY: Past Surgical History:  Procedure Laterality Date   BREAST RECONSTRUCTION WITH PLACEMENT OF TISSUE EXPANDER AND ALLODERM Right 09/14/2018   Procedure: BREAST RECONSTRUCTION WITH PLACEMENT OF TISSUE EXPANDER AND ALLODERM;  Surgeon: Irene Limbo, MD;  Location: [facility];  Service: Plastics;  Laterality: Right;   CESAREAN SECTION     DIAGNOSTIC LAPAROSCOPY     EYE SURGERY     Lasik   LAPAROSCOPIC APPENDECTOMY     LIPOSUCTION WITH LIPOFILLING Right 07/26/2019   Procedure: LIPOFILLING from abdomen to R chest;  Surgeon: Irene Limbo, MD;  Location: [place];  Service: Plastics;  Laterality: Right;   MASTECTOMY WITH RADIOACTIVE SEED GUIDED EXCISION AND AXILLARY SENTINEL LYMPH NODE BIOPSY Right 09/14/2018   Procedure: RIGHT NIPPLE SPARING MASTECTOMY WITH TARGETED DEEP RIGHT AXILLARY LYMPH NODE BIOPSY WITH RADIOACTIVE SEED LOCALIZATION AND RIGHT AXILLARY SENTINEL LYMPH NODE BIOPSY, FROZEN SECTION AND POSSIBLE COMPLETION AXILLARY LYMPH NODE DISSECTION INJECT BLUE DYE RIGHT BREAST;  Surgeon: Fanny Skates, MD;  Location: [facility];  Service: General;  Laterality: Right;   PORT-A-CATH REMOVAL Left  07/26/2019   Procedure: REMOVAL PORT-A-CATH;  Surgeon: Irene Limbo, MD;  Location: [facility];  Service: Plastics;  Laterality: Left;   PORTACATH PLACEMENT Left 04/13/2018   Procedure: INSERTION PORT-A-CATH WITH [place];  Surgeon: Fanny Skates, MD;  Location: [facility];  Service: General;  Laterality: Left;   REMOVAL OF TISSUE EXPANDER AND PLACEMENT OF IMPLANT Right 07/26/2019   Procedure: REMOVAL OF TISSUE EXPANDER AND PLACEMENT OF IMPLANT;  Surgeon: Irene Limbo, MD;  Location: [facility];  Service: Plastics;  Laterality: Right;   UTERINE FIBROID SURGERY      I have reviewed the social history and family history with the patient and they are unchanged from previous note.  ALLERGIES:  is allergic to metrogel [metronidazole], other, paxil [paroxetine hcl], sulfamethoxazole, zomig [zolmitriptan], and prednisone.  MEDICATIONS:  Current Outpatient Medications  Medication Sig Dispense Refill   ALPRAZolam (XANAX) 0.5 MG tablet TAKE 1 TABLET(0.5 MG) BY MOUTH AT BEDTIME AS NEEDED FOR ANXIETY OR SLEEP 30  tablet 0   atenolol (TENORMIN) 50 MG tablet Take 50 mg by mouth at bedtime.     Cholecalciferol (VITAMIN D3) 50 MCG (2000 UT) capsule Take 2,000 Units by mouth daily.     exemestane (AROMASIN) 25 MG tablet Take 1 tablet (25 mg total) by mouth daily after breakfast. 90 tablet 1   ibuprofen (ADVIL) 400 MG tablet Take 400 mg by mouth 3 (three) times daily as needed.     meloxicam (MOBIC) 15 MG tablet Take 1 tablet (15 mg total) by mouth daily as needed for pain. Take with food 20 tablet 0   potassium chloride SA (KLOR-CON) 20 MEQ tablet Take 1 tablet (20 mEq total) by mouth daily. 30 tablet 3   traMADol (ULTRAM) 50 MG tablet Take 1 tablet (50 mg total) by mouth every 12 (twelve) hours as needed. 15 tablet 0   venlafaxine XR (EFFEXOR-XR) 150 MG 24 hr capsule Take 1 capsule (150 mg total) by mouth daily with breakfast. 30 capsule 5   Zinc 50 MG TABS  Take 1 tablet by mouth daily.     Ascorbic Acid (VITAMIN C) 1000 MG tablet Take 2,000 mg by mouth daily.     valACYclovir (VALTREX) 500 MG tablet valacyclovir 500 mg tablet  TAKE 1 TABLET BY MOUTH TWICE DAILY     No current facility-administered medications for this visit.    PHYSICAL EXAMINATION: ECOG PERFORMANCE STATUS: 1 - Symptomatic but completely ambulatory  Vitals:   03/27/21 0913  BP: (!) 143/86  Pulse: 78  Resp: (!) 78  Temp: 98.9 F (37.2 C)  SpO2: 97%   Filed Weights   03/27/21 0913  Weight: 168 lb 6.4 oz (76.4 kg)    GENERAL:alert, no distress and comfortable SKIN: No rash EYES: sclera clear LUNGS: normal breathing effort HEART: no lower extremity edema Musculoskeletal: Focal TTP at the lumbar spine and right low back NEURO: alert & oriented x 3 with fluent speech, no focal motor/sensory deficits Breast exam deferred  LABORATORY DATA:  I have reviewed the data as listed CBC Latest Ref Rng & Units 03/18/2021 02/15/2021 11/22/2020  WBC 4.0 - 10.5 K/uL 4.8 4.5 5.2  Hemoglobin 12.0 - 15.0 g/dL 13.6 13.3 13.2  Hematocrit 36.0 - 46.0 % 36.6 37.1 36.2  Platelets 150 - 400 K/uL 181 168 182     CMP Latest Ref Rng & Units 03/18/2021 02/15/2021 11/22/2020  Glucose 70 - 99 mg/dL 92 136(H) 110(H)  BUN 6 - 20 mg/dL _0 Creatinine 0.44 - 1.00 mg/dL 0.70 0.53 0.66  Sodium 135 - 145 mmol/L 143 143 142  Potassium 3.5 - 5.1 mmol/L 3.5 3.5 3.3(L)  Chloride 98 - 111 mmol/L 106 103 103  CO2 22 - 32 mmol/L _1 Calcium 8.9 - 10.3 mg/dL 9.9 9.8 9.3  Total Protein 6.5 - 8.1 g/dL 7.6 7.5 7.6  Total Bilirubin 0.3 - 1.2 mg/dL 0.6 0.6 0.6  Alkaline Phos 38 - 126 U/L 84 68 81  AST 15 - 41 U/L _2 ALT 0 - 44 U/L _3 RADIOGRAPHIC STUDIES: I have personally reviewed the radiological images as listed and agreed with the findings in the report. No results found.   ASSESSMENT & PLAN: 55 yo female  Low back pain, right greater than left -Onset 01/2021,  spontaneous -Progressing over time, not well managed with NSAIDs -Seems different from usual AI related arthralgia.  This did not resolve when she stopped exemestane -  Received Zometa 03/18/2021, pain was present prior to infusion but possibly worsened after.  Arthralgia is a common side effect of bisphosphonate -Tender on exam -We reviewed possible etiologies including musculoskeletal pain, AI related, versus other.   -She was given tramadol Rx and sent for imaging today, I will call her with results.  If x-rays are negative we will proceed with lumbar MRI to rule out osseous metastasis -I strongly encouraged her to use only pain medication prescribed to her -Follow-up pending results   2. Right posterior rib/flank pain, lower legs, diffuse body aches -After about a year and a half on exemestane, she developed the above side effects which resolved after stopping exemestane.   -We rechallenged tamoxifen, she did not tolerate and stopped after 2 weeks due to fatigue/lethargy, indigestion, and lower body leg/knee/ankle/foot pain -Resumed exemestane 12/26/2020, doing well thus far   3. Basal cell skin cancer -Recently removed from her chest by Dr. Anabel Bene -She will follow-up with dermatology soon for full head to toe exam -Cautioned her to avoid direct sunlight, use skin protective barriers and avoid peak sun exposure   4.Malignant neoplasm of upper-inner quadrant of right breast in female, invasive ductal carcinoma, cT2N1M0 stage IB, ER+/PR+/HER2+, G3  -Diagnosed 02/2018, s/p neoadjuvant TCHP, R mastectomy/ALND, and adjuvant radiation. She completed 1 year maintenance herceptin/perjeta in 03/2019 and Neratinib 08/2019 - 2/022. -she started anti-estrogen therapy 12/2018, did not tolerate tamoxifen, anastrozole, or letrozole. On exemestane since 04/2019 tolerating well until 10/2020 -Try tamoxifen again briefly but tolerated poorly and resumed exemestane in 12/2020 -s/p reconstruction by Dr. Iran Planas  07/26/19 -last visit 08/30/20 showed palpable deep nodule RUQ right breast, US showed benign cyst. Otherwise doing well -no evidence of recurrence, continue surveillance -Next mammo 04/2021 -Next routine surveillance follow-up 08/19/21, or sooner if needed   5. Anxiety -She has history of anxiety, currently on Xanax as needed  Plan: -Lumbar sacral x-ray today -If negative, proceed with lumbar MRI -Continue exemestane -Rx tramadol -We will follow-up with results and next steps  Orders Placed This Encounter  Procedures   DG Lumbar Spine Complete    Standing Status:   Future    Number of Occurrences:   1    Standing Expiration Date:   03/27/2022    Order Specific Question:   Reason for Exam (SYMPTOM  OR DIAGNOSIS REQUIRED)    Answer:   h/o breast cancer, on AI, 1 mo h/o LBP R>L    Order Specific Question:   Is patient pregnant?    Answer:   No    Order Specific Question:   Preferred imaging location?    Answer:   [facility]   DG Sacrum/Coccyx    Standing Status:   Future    Number of Occurrences:   1    Standing Expiration Date:   03/27/2022    Order Specific Question:   Reason for Exam (SYMPTOM  OR DIAGNOSIS REQUIRED)    Answer:   h/o breast cancer on AI, 1 mo h/o LBP R>L    Order Specific Question:   Is patient pregnant?    Answer:   No    Order Specific Question:   Preferred imaging location?    Answer:   [facility]   All questions were answered. The patient knows to call the clinic with any problems, questions or concerns. No barriers to learning were detected.  Total encounter time was 30 minutes.     Alla Feeling, NP 03/27/21

## 2021-03-28 ENCOUNTER — Encounter: Payer: Self-pay | Admitting: Nurse Practitioner

## 2021-03-29 ENCOUNTER — Telehealth: Payer: Self-pay

## 2021-03-29 ENCOUNTER — Encounter: Payer: Self-pay | Admitting: Hematology

## 2021-03-29 ENCOUNTER — Other Ambulatory Visit: Payer: Self-pay | Admitting: Hematology

## 2021-03-29 MED ORDER — LACTULOSE 20 GM/30ML PO SOLN: 20.0000 mL | ORAL | 0 refills | Status: DC | PRN

## 2021-03-29 NOTE — Telephone Encounter (Signed)
This nurse spoke with patient who states that she has been dealing with constipation.  Denies having a bowel movement for 6 days.  Patient has tried Dulcolax and [employer] brand of stool softener.  She would like to know if something can be ordered to help her.  This nurse informed her that MD will be made aware and she will be notified of recommendations.

## 2021-03-29 NOTE — Telephone Encounter (Signed)
This nurse spoke with patient and advised patient Per Dr. Burr Medico to try Magnesium Citrate which is OTC.  She has also called in an order for Lactulose to the [facility].  Patient acknowledged understanding.  Patient know to call clinic if there are any other questions or concerns.

## 2021-03-30 ENCOUNTER — Other Ambulatory Visit: Payer: Self-pay | Admitting: Hematology

## 2021-03-30 ENCOUNTER — Other Ambulatory Visit: Payer: Self-pay | Admitting: Nurse Practitioner

## 2021-03-30 DIAGNOSIS — M79604 Pain in right leg: Secondary | ICD-10-CM

## 2021-03-30 DIAGNOSIS — Z170 Estrogen receptor positive status [ER+]: Secondary | ICD-10-CM

## 2021-03-30 DIAGNOSIS — C50211 Malignant neoplasm of upper-inner quadrant of right female breast: Secondary | ICD-10-CM

## 2021-03-30 DIAGNOSIS — M545 Low back pain, unspecified: Secondary | ICD-10-CM

## 2021-03-31 ENCOUNTER — Other Ambulatory Visit: Payer: Self-pay | Admitting: Hematology

## 2021-04-01 ENCOUNTER — Other Ambulatory Visit: Payer: Self-pay | Admitting: Nurse Practitioner

## 2021-04-01 DIAGNOSIS — F419 Anxiety disorder, unspecified: Secondary | ICD-10-CM

## 2021-04-02 ENCOUNTER — Encounter: Payer: Self-pay | Admitting: Hematology

## 2021-04-02 ENCOUNTER — Other Ambulatory Visit: Payer: Self-pay | Admitting: Nurse Practitioner

## 2021-04-02 ENCOUNTER — Encounter: Payer: Self-pay | Admitting: Nurse Practitioner

## 2021-04-02 DIAGNOSIS — F419 Anxiety disorder, unspecified: Secondary | ICD-10-CM

## 2021-04-02 MED ORDER — ALPRAZOLAM 0.5 MG PO TABS: ORAL_TABLET | ORAL | 0 refills | Status: DC

## 2021-04-04 ENCOUNTER — Encounter: Payer: Self-pay | Admitting: Hematology

## 2021-04-08 ENCOUNTER — Other Ambulatory Visit: Payer: Self-pay | Admitting: Nurse Practitioner

## 2021-04-09 ENCOUNTER — Telehealth: Payer: Self-pay

## 2021-04-09 ENCOUNTER — Encounter: Payer: Self-pay | Admitting: Hematology

## 2021-04-09 NOTE — Telephone Encounter (Signed)
This nurse returned call to patient who stated that on Sunday 04/07/21 she had some sinus drainage then on last night she developed a fever and a non-productive cough, patient is complaining of pain all over.  This nurse noticed that patient had increased effort with breathing in between words.  This nurse advised patient that she needs to be evaluated and treated.  Patient stated that she was currently at [facility] waiting to be tested for Covid.  Patient wanted to check on her order for Tramadol.  This nurse contacted [facility] who informed this nurse that medication is now ready for pick up.  They were waiting for approval from insurance.  This nurse has informed patient that prescription is now ready.  This nurse made patient aware to notify this clinic of her Covid results as soon as possible.  No further questions or concerns at this time. Providers have been made aware.

## 2021-04-10 ENCOUNTER — Ambulatory Visit (HOSPITAL_COMMUNITY): Payer: Medicaid Other

## 2021-04-16 ENCOUNTER — Ambulatory Visit (HOSPITAL_COMMUNITY)
Admission: RE | Admit: 2021-04-16 | Discharge: 2021-04-16 | Disposition: A | Discharge status: Home / Self Care | Payer: Medicaid Other | Source: Ambulatory Visit | Attending: Nurse Practitioner | Admitting: Nurse Practitioner

## 2021-04-16 DIAGNOSIS — C50211 Malignant neoplasm of upper-inner quadrant of right female breast: Secondary | ICD-10-CM

## 2021-04-16 DIAGNOSIS — M545 Low back pain, unspecified: Secondary | ICD-10-CM | POA: Insufficient documentation

## 2021-04-16 DIAGNOSIS — M79605 Pain in left leg: Secondary | ICD-10-CM | POA: Insufficient documentation

## 2021-04-16 DIAGNOSIS — M79604 Pain in right leg: Secondary | ICD-10-CM | POA: Insufficient documentation

## 2021-04-16 DIAGNOSIS — Z170 Estrogen receptor positive status [ER+]: Secondary | ICD-10-CM | POA: Diagnosis present

## 2021-04-17 ENCOUNTER — Ambulatory Visit (HOSPITAL_COMMUNITY): Admission: RE | Admit: 2021-04-17 | Payer: Medicaid Other | Source: Ambulatory Visit

## 2021-04-22 ENCOUNTER — Other Ambulatory Visit: Payer: Self-pay | Admitting: Hematology

## 2021-04-22 ENCOUNTER — Telehealth: Payer: Self-pay | Admitting: Nurse Practitioner

## 2021-04-22 DIAGNOSIS — M79605 Pain in left leg: Secondary | ICD-10-CM

## 2021-04-22 DIAGNOSIS — M79604 Pain in right leg: Secondary | ICD-10-CM

## 2021-04-22 NOTE — Telephone Encounter (Signed)
I called Ms. Uplinger to review spine MRI which shows degenerative disc changes, no bone mets. Pain has improved, she was able to mop her entire house yesterday. I recommend to wean off tramadol which she has, now on NSAIDs, and restart exemestane which she has also done. She agrees to ortho referral which I placed today.   She notes she recovered from [place] now has dry cough and "shakes" without fever. Her husband is also recovering from covid. She is preparing to go to work during this call. We discussed the possibility of secondary infection. I encouraged symptom management for now, cough med, hydration, and to monitor symptoms. If she worsens or fails to improve over the next few days she will call PCP.   She agrees with the plan and appreciates the call. I will schedule phone call in 2 months, next in person visit 07/2021.   Cira Rue, NP

## 2021-04-29 ENCOUNTER — Encounter: Payer: Self-pay | Admitting: Hematology

## 2021-04-30 ENCOUNTER — Telehealth: Payer: Self-pay | Admitting: Nurse Practitioner

## 2021-04-30 NOTE — Telephone Encounter (Signed)
Scheduled follow-up appointment per 9/26 patient call message. Patient is aware.

## 2021-05-01 ENCOUNTER — Other Ambulatory Visit: Payer: Self-pay | Admitting: Nurse Practitioner

## 2021-05-01 DIAGNOSIS — F419 Anxiety disorder, unspecified: Secondary | ICD-10-CM

## 2021-05-02 ENCOUNTER — Other Ambulatory Visit: Payer: Self-pay | Admitting: Nurse Practitioner

## 2021-05-02 DIAGNOSIS — F419 Anxiety disorder, unspecified: Secondary | ICD-10-CM

## 2021-05-02 MED ORDER — ALPRAZOLAM 0.5 MG PO TABS: ORAL_TABLET | ORAL | 0 refills | Status: DC

## 2021-05-10 ENCOUNTER — Ambulatory Visit
Admission: RE | Admit: 2021-05-10 | Discharge: 2021-05-10 | Disposition: A | Discharge status: Home / Self Care | Payer: Medicaid Other | Source: Ambulatory Visit | Attending: Hematology | Admitting: Hematology

## 2021-05-10 ENCOUNTER — Other Ambulatory Visit: Payer: Self-pay

## 2021-05-10 DIAGNOSIS — Z170 Estrogen receptor positive status [ER+]: Secondary | ICD-10-CM

## 2021-05-10 DIAGNOSIS — C50211 Malignant neoplasm of upper-inner quadrant of right female breast: Secondary | ICD-10-CM

## 2021-05-14 ENCOUNTER — Other Ambulatory Visit: Payer: Self-pay

## 2021-05-14 ENCOUNTER — Ambulatory Visit (INDEPENDENT_AMBULATORY_CARE_PROVIDER_SITE_OTHER): Payer: Medicaid Other | Admitting: Orthopaedic Surgery

## 2021-05-14 ENCOUNTER — Encounter: Payer: Self-pay | Admitting: Orthopaedic Surgery

## 2021-05-14 DIAGNOSIS — M545 Low back pain, unspecified: Secondary | ICD-10-CM | POA: Insufficient documentation

## 2021-05-14 DIAGNOSIS — G8929 Other chronic pain: Secondary | ICD-10-CM | POA: Diagnosis not present

## 2021-05-14 NOTE — Addendum Note (Signed)
Addended by: Marybelle Killings on: 05/14/2021 10:29 AM   Modules accepted: Orders

## 2021-05-14 NOTE — Progress Notes (Signed)
Office Visit Note   Patient: Claire Guzman           Date of Birth: 09-09-1965           MRN: [id number] Visit Date: 05/14/2021              Requested by: Alla Feeling, NP [place],  [place] [address] PCP: Lujean Amel, MD   Assessment & Plan: Visit Diagnoses: Low back pain  Plan: Patient can continue walking program walking her dogs.  We reviewed MRI scan and bone scan.  She is using anti-inflammatories occasionally.  She will return if she develops progressive radicular symptoms.  No injections or surgery is recommended at this point.  Follow-Up Instructions: No follow-ups on file.   Orders:  No orders of the defined types were placed in this encounter.  No orders of the defined types were placed in this encounter.     Procedures: No procedures performed   Clinical Data: No additional findings.   Subjective: Chief Complaint  Patient presents with   Lower Back - Pain    HPI 55 year old female with chronic low back pain.  She has had some symptoms sometimes on the right sometimes on the left.  She does have problem with some knee aching pain.  Previous breast cancer with mastectomy 2020.  She had preop chemo prior to her surgery.  MRI scan lumbar 04/17/2021 showed mild multilevel degenerative changes without compression.  Marrow signal was normal.  Recently she has had more problems left buttocks and the left thigh.  She has had pain in her feet had some cortisone injections by her podiatrist.  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  Negative bowel bladder symptoms no fever or chills.  No claudication symptoms.  Patient's husband had severe injury with pelvic fractures requiring reconstruction and is been on pain management since that time which has been stressful for her.   Review of Systems positive for numbness in her feet previous mastectomy with reconstruction surgery right breast.  All other systems noncontributory to  HPI.   Objective: Vital Signs: BP 133/68   Pulse 65   Ht 5\' 4"  (1.626 m)   Wt 168 lb (76.2 kg)   LMP 03/28/2018 (Within Days)   BMI 28.84 kg/m   Physical Exam Constitutional:      Appearance: She is well-developed.  HENT:     Head: Normocephalic.     Right Ear: External ear normal.     Left Ear: External ear normal. There is no impacted cerumen.  Eyes:     Pupils: Pupils are equal, round, and reactive to light.  Neck:     Thyroid: No thyromegaly.     Trachea: No tracheal deviation.  Cardiovascular:     Rate and Rhythm: Normal rate.  Pulmonary:     Effort: Pulmonary effort is normal.  Abdominal:     Palpations: Abdomen is soft.  Musculoskeletal:     Cervical back: No rigidity.  Skin:    General: Skin is warm and dry.  Neurological:     Mental Status: She is alert and oriented to person, place, and time.  Psychiatric:        Behavior: Behavior normal.    Ortho Exam negative straight leg raising right left.  Negative logroll of the hips negative FABER test.  Some tenderness to the lumbosacral junction L4-S1 in the midline.  Sacroiliac joints are normal no sciatic notch tenderness no trochanteric bursal tenderness.  Mild lumbar curvature.  Specialty Comments:  No specialty comments available.  Imaging: No results found.   PMFS History: Patient Active Problem List   Diagnosis Date Noted   Low back pain 05/14/2021   Acquired absence of right breast 09/22/2018   Port-A-Cath in place 05/11/2018   Genetic testing 04/12/2018   Family history of prostate cancer    Family history of colon cancer    Family history of melanoma    Malignant neoplasm of upper-inner quadrant of right breast in female, estrogen receptor positive ([place]) 03/10/2018   Past Medical History:  Diagnosis Date   Abdominal pain, unspecified site    Anxiety state, unspecified    Complication of anesthesia    convulsions - when she has appendectomy about 12 years ago at [facility]   Contact  dermatitis and other eczema, due to unspecified cause    Essential hypertension, benign    Family history of colon cancer    Family history of melanoma    Family history of prostate cancer    GERD (gastroesophageal reflux disease)    Hematuria, unspecified    Hypertension    Lower back pain    Mixed hyperlipidemia    Rosacea    rt breast ca dx'd 01/2018   R breast cancer   Unspecified symptom associated with female genital organs     Family History  Problem Relation Age of Onset   Heart failure Mother    Melanoma Mother 20       has had several removed over the past ten years   Prostate cancer Father 88   Stroke Father    Heart attack Maternal Grandmother    Heart attack Maternal Grandfather        possible cancer, unknown type   Stroke Paternal Grandmother    Colon cancer Paternal Grandfather        dx upper 31s   Hypertension Other    Heart Problems Maternal Aunt        d.60   Heart Problems Maternal Uncle        d.60   Colon cancer Paternal Aunt        dx 12s   Colon cancer Paternal Uncle        dx 38s    Past Surgical History:  Procedure Laterality Date   BREAST RECONSTRUCTION WITH PLACEMENT OF TISSUE EXPANDER AND ALLODERM Right 09/14/2018   Procedure: BREAST RECONSTRUCTION WITH PLACEMENT OF TISSUE EXPANDER AND ALLODERM;  Surgeon: Irene Limbo, MD;  Location: [facility];  Service: Plastics;  Laterality: Right;   CESAREAN SECTION     DIAGNOSTIC LAPAROSCOPY     EYE SURGERY     Lasik   LAPAROSCOPIC APPENDECTOMY     LIPOSUCTION WITH LIPOFILLING Right 07/26/2019   Procedure: LIPOFILLING from abdomen to R chest;  Surgeon: Irene Limbo, MD;  Location: [facility];  Service: Plastics;  Laterality: Right;   MASTECTOMY WITH RADIOACTIVE SEED GUIDED EXCISION AND AXILLARY SENTINEL LYMPH NODE BIOPSY Right 09/14/2018   Procedure: RIGHT NIPPLE SPARING MASTECTOMY WITH TARGETED DEEP RIGHT AXILLARY LYMPH NODE BIOPSY WITH RADIOACTIVE SEED LOCALIZATION AND RIGHT  AXILLARY SENTINEL LYMPH NODE BIOPSY, FROZEN SECTION AND POSSIBLE COMPLETION AXILLARY LYMPH NODE DISSECTION INJECT BLUE DYE RIGHT BREAST;  Surgeon: Fanny Skates, MD;  Location: [facility];  Service: General;  Laterality: Right;   PORT-A-CATH REMOVAL Left 07/26/2019   Procedure: REMOVAL PORT-A-CATH;  Surgeon: Irene Limbo, MD;  Location: [facility];  Service: Plastics;  Laterality: Left;  PORTACATH PLACEMENT Left 04/13/2018   Procedure: INSERTION PORT-A-CATH WITH [place];  Surgeon: Fanny Skates, MD;  Location: [facility];  Service: General;  Laterality: Left;   REMOVAL OF TISSUE EXPANDER AND PLACEMENT OF IMPLANT Right 07/26/2019   Procedure: REMOVAL OF TISSUE EXPANDER AND PLACEMENT OF IMPLANT;  Surgeon: Irene Limbo, MD;  Location: [facility];  Service: Plastics;  Laterality: Right;   UTERINE FIBROID SURGERY     Social History   Occupational History   Not on file  Tobacco Use   Smoking status: Never   Smokeless tobacco: Never  Vaping Use   Vaping Use: Never used  Substance and Sexual Activity   Alcohol use: No   Drug use: Never   Sexual activity: Not on file

## 2021-06-11 ENCOUNTER — Other Ambulatory Visit: Payer: Self-pay | Admitting: Nurse Practitioner

## 2021-06-11 DIAGNOSIS — F419 Anxiety disorder, unspecified: Secondary | ICD-10-CM

## 2021-06-14 ENCOUNTER — Other Ambulatory Visit: Payer: Self-pay | Admitting: Nurse Practitioner

## 2021-06-14 DIAGNOSIS — C50211 Malignant neoplasm of upper-inner quadrant of right female breast: Secondary | ICD-10-CM

## 2021-06-14 DIAGNOSIS — Z170 Estrogen receptor positive status [ER+]: Secondary | ICD-10-CM

## 2021-06-16 ENCOUNTER — Encounter: Payer: Self-pay | Admitting: Hematology

## 2021-06-23 NOTE — Progress Notes (Signed)
[place]   [phone] [phone]   Clinic Follow up Note   Patient Care Team: Lujean Amel, MD as PCP - General (Family Medicine) Fanny Skates, MD as Consulting Physician (General Surgery) Truitt Merle, MD as Consulting Physician (Hematology) Eppie Gibson, MD as Attending Physician (Radiation Oncology) Mauro Kaufmann, RN as Oncology Nurse Navigator Rockwell Germany, RN as Oncology Nurse Navigator 06/24/2021  I connected with Claire Guzman on 06/24/21 at 10:00 AM EST by telephone visit and verified that I am speaking with the correct person using two identifiers.   I discussed the limitations, risks, security and privacy concerns of performing an evaluation and management service by telemedicine and the availability of in-person appointments. I also discussed with the patient that there may be a patient responsible charge related to this service. The patient expressed understanding and agreed to proceed.   Other persons participating in the visit and their role in the encounter: None  Patient's location: home  Provider's location: [place] office   CHIEF COMPLAINT: Follow up back pain, exemestane tolerance, and h/o breast cancer   SUMMARY OF ONCOLOGIC HISTORY: Oncology History Overview Note  Cancer Staging Malignant neoplasm of upper-inner quadrant of right breast in female, estrogen receptor positive ([place]) Staging form: Breast, AJCC 8th Edition - Clinical stage from 03/08/2018: Stage IB (cT2, cN1, cM0, G3, ER+, PR+, HER2+) - Signed by Truitt Merle, MD on 03/16/2018     Malignant neoplasm of upper-inner quadrant of right breast in female, estrogen receptor positive ([place])  02/24/2018 Mammogram   screening mammogram on 02/24/2018 that was benign   03/04/2018 Mammogram   diagnostic mammogram on 03/04/2018 due to a palpable mass on her right breast. Diagnostic mammogram, breast US revealed and biopsy revealed suspicious mass in the right breast  at 2:30 at the palpable site of concern and one suspicious right axillary LN.   03/08/2018 Cancer Staging   Staging form: Breast, AJCC 8th Edition - Clinical stage from 03/08/2018: Stage IB (cT2, cN1, cM0, G3, ER+, PR+, HER2+) - Signed by Truitt Merle, MD on 03/16/2018    03/08/2018 Receptors her2   Her2 positive, ER 90% PR 5% and Ki67 60%   03/08/2018 Pathology Results   Diagnosis 1. Breast, right, needle core biopsy, 2:30 - INVASIVE DUCTAL CARCINOMA, GRADE 3. - LYMPHOVASCULAR SPACE INVOLVEMENT BY TUMOR. 2. Lymph node, needle/core biopsy, right axilla - METASTATIC CARCINOMA INVOLVING SCANT LYMPHOID TISSUE.   03/10/2018 Initial Diagnosis   Malignant neoplasm of upper-inner quadrant of right breast in female, estrogen receptor positive ([place])   03/20/2018 Imaging   MRI brain 03/20/18 IMPRESSION: No evidence of metastatic disease.  Normal appearance of brain. Slightly heterogeneous marrow pattern of the upper cervical spine, nonspecific but likely benign, especially in the setting of early stage disease.    03/24/2018 Echocardiogram   Baseline ECHO 03/24/18 Study Conclusions - Left ventricle: The cavity size was normal. Wall thickness was   increased in a pattern of mild LVH. Systolic function was normal.   The estimated ejection fraction was in the range of 60% to 65%.   Wall motion was normal; there were no regional wall motion   abnormalities. Doppler parameters are consistent with abnormal   left ventricular relaxation (grade 1 diastolic dysfunction). - Right ventricle: The cavity size was mildly dilated. Wall   thickness was normal.   03/25/2018 Imaging   MRI Breast B/l 03/25/18 IMPRESSION: 1. The patient's primary malignancy in the posterior right breast measures 3.2 x 2 x 2.6 cm  with apparent invasion of the underlying pectoralis muscle. Numerous surrounding abnormal enhancing masses, consistent with satellite lesions, extend from 11 o'clock in the right breast into the  inferolateral right breast with a total span of suspected disease measuring 4.5 x 4.6 x 6.4 cm in AP, transverse, and craniocaudal dimension. The suspected extent of disease involves the superior and inferolateral quadrants. The primary malignancy also extends just across midline into the superior medial quadrant. 2. No MRI evidence of malignancy in the left breast. 3. Known metastatic noted in the right axilla.   03/25/2018 Imaging   Whole body bone scan 03/25/18 IMPRESSION: No scintigraphic evidence skeletal metastasis.   03/25/2018 Imaging   CT CAP W Contrast 03/25/18 IMPRESSION: 1. Mass within the deep aspect of the right breast along the pectoralis muscle compatible with primary breast malignancy. 2. There is suggestion of two additional possible masses within the lateral aspect of the right breast versus nodular appearing breast tissue. Recommend dedicated evaluation with bilateral breast MRI. 3. Mildly thickened right axillary lymph node compatible with metastatic adenopathy, recently biopsied. 4. Multiple bilateral pulmonary nodules are indeterminate, potentially sequelae of prior infectious/inflammatory process. Recommend attention on follow-up. Consider follow-up CT in 6 months. 5. Portal venous gas is demonstrated within the left hepatic lobe. Additionally, there is wall thickening of the cecum and ascending colon with small amount of gas within the pericolonic vasculature. Constellation of findings is indeterminate in etiology however may be secondary to colitis at this location with considerations including infectious, inflammatory or ischemic etiologies. Correlate for recent procedure. If patient is not up-to-date for colonic screening, recommend further evaluation with colonoscopy to exclude the possibility of colonic mass within the cecum/ascending colon. 6. These results were called by telephone at the time of interpretation on 03/25/2018 at 9:31 am to Dr. Truitt Merle ,  who verbally acknowledged these results.   03/30/2018 - 04/08/2019 Chemotherapy   Neoadjuvant TCHP every 3 weeks starting 03/30/18. Completed 6 cycles of TCHP on 07/23/18.  Continued with maintenance Herceptin/Perjeta q3weeks to complete 1 year.    04/09/2018 Genetic Testing   Negative genetic testing on the [facility] Common Hereditary Cancers Panel. The Common Hereditary Cancers Panel offered by [facility] includes sequencing and/or deletion duplication testing of the following 47 genes: APC, ATM, AXIN2, BARD1, BMPR1A, BRCA1, BRCA2, BRIP1, CDH1, CDKN2A (p14ARF), CDKN2A (p16INK4a), CKD4, CHEK2, CTNNA1, DICER1, EPCAM (Deletion/duplication testing only), GREM1 (promoter region deletion/duplication testing only), KIT, MEN1, MLH1, MSH2, MSH3, MSH6, MUTYH, NBN, NF1, NHTL1, PALB2, PDGFRA, PMS2, POLD1, POLE, PTEN, RAD50, RAD51C, RAD51D, SDHB, SDHC, SDHD, SMAD4, SMARCA4. STK11, TP53, TSC1, TSC2, and VHL.  The following genes were evaluated for sequence changes only: SDHA and HOXB13 c.251G>A variant only.   Genetic testing did detect a Variant of Unknown Significance (VUS) in the POLD1 gene called c.985C>T (p.Pro329Ser). At this time, it is unknown if this variant is associated with increased cancer risk or if this is a normal finding, but most variants such as this get reclassified to being inconsequential. It should not be used to make medical management decisions.   The report date is 04/09/2018.   07/24/2018 Breast MRI   IMPRESSION: 1. Resolution of the enhancing mass (known cancer) and small satellite lesions previously seen in the right breast following chemotherapy.   2. The metastatic lymph node in the right axilla has decreased in size.   3.  No MRI evidence of left breast malignancy.   09/14/2018 Surgery   RIGHT NIPPLE SPARING MASTECTOMY WITH TARGETED DEEP RIGHT AXILLARY LYMPH NODE BIOPSY  WITH RADIOACTIVE SEED LOCALIZATION AND RIGHT AXILLARY SENTINEL LYMPH NODE BIOPSY by Dr. Dalbert Batman  09/14/18     09/14/2018 Pathology Results   Diagnosis 1. Lymph node, sentinel, biopsy, right axillary with radioactive seed - ONE OF ONE LYMPH NODES NEGATIVE FOR CARCINOMA (0/1). - MILD TREATMENT EFFECT. - BIOPSY SITE. 2. Lymph node, sentinel, biopsy, right axillary - ONE OF ONE LYMPH NODES NEGATIVE FOR CARCINOMA (0/1). 3. Lymph node, sentinel, biopsy, right - ONE OF ONE LYMPH NODES NEGATIVE FOR CARCINOMA (0/1). 4. Lymph node, sentinel, biopsy, right - ONE OF ONE LYMPH NODES NEGATIVE FOR CARCINOMA (0/1). 5. Lymph node, sentinel, biopsy, right - ONE OF ONE LYMPH NODES NEGATIVE FOR CARCINOMA (0/1). 6. Lymph node, sentinel, biopsy, right - ONE OF ONE LYMPH NODES NEGATIVE FOR CARCINOMA (0/1). 7. Lymph node, sentinel, biopsy, right - ONE OF ONE LYMPH NODES NEGATIVE FOR CARCINOMA (0/1). 8. Lymph node, sentinel, biopsy, right - ONE OF ONE LYMPH NODES NEGATIVE FOR CARCINOMA (0/1). 9. Lymph node, sentinel, biopsy, right - ONE OF ONE LYMPH NODES NEGATIVE FOR CARCINOMA (0/1). 10. Lymph node, sentinel, biopsy, right - ONE OF ONE LYMPH NODES NEGATIVE FOR CARCINOMA (0/1). 11. Lymph node, sentinel, biopsy, right - ONE OF ONE LYMPH NODES NEGATIVE FOR CARCINOMA (0/1). 12. Lymph node, sentinel, biopsy, right - ONE OF ONE LYMPH NODES NEGATIVE FOR CARCINOMA (0/1). 13. Nipple Biopsy, right - BENIGN NIPPLE TISSUE. 14. Breast, simple mastectomy, right - NO RESIDUAL CARCINOMA IDENTIFIED. - TREATMENT EFFECT. - FIBROCYSTIC CHANGE. - ONE OF ONE LYMPH NODES NEGATIVE FOR CARCINOMA (0/1). - SEE ONCOLOGY TABLE.   09/14/2018 Cancer Staging   Staging form: Breast, AJCC 8th Edition - Pathologic stage from 09/14/2018: No Stage Recommended (ypT0, pN0, cM0, GX, ER: Not Assessed, PR: Not Assessed, HER2: Not Assessed) - Signed by Truitt Merle, MD on 09/23/2018    11/03/2018 - 12/10/2018 Radiation Therapy   Adjuvant Radiation with Dr. Isidore Moos    12/27/2018 -  Anti-estrogen oral therapy   Start Tamoxifen 74m daily in 12/27/2018 stopped  after 1 week due to poor toleration. She started anastrozole on 02/04/19 but stopped on 02/14/19 due to worsening muscle aches. She tried Letrozole but did not tolerate. She was switched to Exemestane in late 04/2019.    01/04/2019 Imaging   CT chest  IMPRESSION: 1. Stable tiny subpleural and perifissural nodules bilaterally. No suspicious pulmonary nodule or mass. 2. No focal airspace disease in the right lung. 3.  Aortic Atherosclerois (ICD10-170.0)     03/08/2019 Imaging   CT AP W Contrast 03/08/19  IMPRESSION: 1. No metastatic disease in the abdomen pelvis. 2. No evidence adverse immunotherapy therapy reaction.   03/08/2019 Imaging   Whole body bone scan 03/08/19  IMPRESSION: No definite scintigraphic evidence of osseous metastases.   08/29/2019 - 08/29/2020 Chemotherapy   Started Nerlynx 2465mdaily with budesonide on 08/29/19. Reduced to 20075mn late 08/2019. Completed on 08/29/20       CURRENT THERAPY:  1.Antiestrogen  -Tamoxifen 72m10mily starting 12/27/2018 stopped after 1 week due to poor toleration.  -Started anastrozole on 02/04/19 but stopped on 02/14/19 due to worsening muscle aches.  -Tried Letrozole but did not tolerate.  -Switched to Exemestane in late 04/2019.  Stopped 11/22/20 due to worsening joint pain -Re-challenged Tamoxifen 10 mg BID 12/10/20 - 12/20/20, stopped due to fatigue, lethargy, indigestion, bone pain in legs, knees, ankles, feet -Resumed exemestane 12/26/20    2. Nerlynx 240mg76mly with budesonide on 08/29/19. Reduced to 200mg 48mate 08/2019, completed 08/2020   3.  Zometa q6 months x2 years, 08/2020, 03/18/2021  INTERVAL HISTORY: Ms. Hamlett presents for virtual visit as scheduled. Last seen in 03/2021 for back pain, work up showed degenerative disc disease. She was seen by Dr. Lorin Mercy ortho 05/14/21 but pain was not bothersome. She continues exemestane, not doing well right now with bad back, knee, and foot pain. Foot pain responds to injections per podiatry. She had  difficulty bending to decorate her house for the holidays. Also notes back pain when she takes her large dog on walks. She went to [facility] in November and held exemestane, she was able to walk long distance and was pain free. This pain is very intrusive to her daily life but is scared to come off AI for fear of cancer recurrence.  Currently she got the flu from her daughter and feels terrible. She has dry cough, body aches, chills, and subjective fever. She is taking mucinex. She is eating and drinking. She is requesting tamiflu, has responded well to this in the past. She did not get flu vac this season.   MEDICAL HISTORY:  Past Medical History:  Diagnosis Date   Abdominal pain, unspecified site    Anxiety state, unspecified    Complication of anesthesia    convulsions - when she has appendectomy about 12 years ago at [facility]   Contact dermatitis and other eczema, due to unspecified cause    Essential hypertension, benign    Family history of colon cancer    Family history of melanoma    Family history of prostate cancer    GERD (gastroesophageal reflux disease)    Hematuria, unspecified    Hypertension    Lower back pain    Mixed hyperlipidemia    Rosacea    rt breast ca dx'd 01/2018   R breast cancer   Unspecified symptom associated with female genital organs     SURGICAL HISTORY: Past Surgical History:  Procedure Laterality Date   BREAST RECONSTRUCTION WITH PLACEMENT OF TISSUE EXPANDER AND ALLODERM Right 09/14/2018   Procedure: BREAST RECONSTRUCTION WITH PLACEMENT OF TISSUE EXPANDER AND ALLODERM;  Surgeon: Irene Limbo, MD;  Location: [place];  Service: Plastics;  Laterality: Right;   CESAREAN SECTION     DIAGNOSTIC LAPAROSCOPY     EYE SURGERY     Lasik   LAPAROSCOPIC APPENDECTOMY     LIPOSUCTION WITH LIPOFILLING Right 07/26/2019   Procedure: LIPOFILLING from abdomen to R chest;  Surgeon: Irene Limbo, MD;  Location: [place];  Service: Plastics;   Laterality: Right;   MASTECTOMY WITH RADIOACTIVE SEED GUIDED EXCISION AND AXILLARY SENTINEL LYMPH NODE BIOPSY Right 09/14/2018   Procedure: RIGHT NIPPLE SPARING MASTECTOMY WITH TARGETED DEEP RIGHT AXILLARY LYMPH NODE BIOPSY WITH RADIOACTIVE SEED LOCALIZATION AND RIGHT AXILLARY SENTINEL LYMPH NODE BIOPSY, FROZEN SECTION AND POSSIBLE COMPLETION AXILLARY LYMPH NODE DISSECTION INJECT BLUE DYE RIGHT BREAST;  Surgeon: Fanny Skates, MD;  Location: [place];  Service: General;  Laterality: Right;   PORT-A-CATH REMOVAL Left 07/26/2019   Procedure: REMOVAL PORT-A-CATH;  Surgeon: Irene Limbo, MD;  Location: [place];  Service: Plastics;  Laterality: Left;   PORTACATH PLACEMENT Left 04/13/2018   Procedure: INSERTION PORT-A-CATH WITH [place];  Surgeon: Fanny Skates, MD;  Location: [place];  Service: General;  Laterality: Left;   REMOVAL OF TISSUE EXPANDER AND PLACEMENT OF IMPLANT Right 07/26/2019   Procedure: REMOVAL OF TISSUE EXPANDER AND PLACEMENT OF IMPLANT;  Surgeon: Irene Limbo, MD;  Location: [place];  Service: Plastics;  Laterality: Right;   UTERINE FIBROID SURGERY      I have reviewed the social history and family history with the patient and they are unchanged from previous note.  ALLERGIES:  is allergic to metrogel [metronidazole], other, paxil [paroxetine hcl], sulfamethoxazole, zomig [zolmitriptan], and prednisone.  MEDICATIONS:  Current Outpatient Medications  Medication Sig Dispense Refill   oseltamivir (TAMIFLU) 75 MG capsule Take 1 capsule (75 mg total) by mouth 2 (two) times daily. 10 capsule 0   ALPRAZolam (XANAX) 0.5 MG tablet TAKE 1 TABLET BY MOUTH AT BEDTIME AS NEEDED FOR ANXIETY OR SLEEP 30 tablet 0   atenolol (TENORMIN) 50 MG tablet Take 50 mg by mouth at bedtime.     Cholecalciferol (VITAMIN D3) 50 MCG (2000 UT) capsule Take 2,000 Units by mouth daily.     exemestane (AROMASIN) 25 MG tablet TAKE 1 TABLET(25 MG) BY MOUTH  DAILY AFTER BREAKFAST 90 tablet 1   ibuprofen (ADVIL) 400 MG tablet Take 400 mg by mouth 3 (three) times daily as needed.     lactulose (CHRONULAC) 10 GM/15ML solution TAKE 20 MLS BY MOUTH EVERY 4 HOURS AS NEEDED FOR CONSTIPATION 237 mL 0   meloxicam (MOBIC) 15 MG tablet TAKE 1 TABLET(15 MG) BY MOUTH DAILY WITH FOOD AS NEEDED FOR PAIN 20 tablet 0   potassium chloride SA (KLOR-CON) 20 MEQ tablet Take 1 tablet (20 mEq total) by mouth daily. 30 tablet 3   traMADol (ULTRAM) 50 MG tablet TAKE 1 TABLET(50 MG) BY MOUTH EVERY 12 HOURS AS NEEDED 15 tablet 0   valACYclovir (VALTREX) 500 MG tablet valacyclovir 500 mg tablet  TAKE 1 TABLET BY MOUTH TWICE DAILY     venlafaxine XR (EFFEXOR-XR) 150 MG 24 hr capsule Take 1 capsule (150 mg total) by mouth daily with breakfast. 30 capsule 5   Zinc 50 MG TABS Take 1 tablet by mouth daily.     No current facility-administered medications for this visit.    PHYSICAL EXAMINATION: ECOG PERFORMANCE STATUS: 1 - Symptomatic but completely ambulatory  There were no vitals filed for this visit. There were no vitals filed for this visit.  Patient appears stable over the phone. Sounds congested with occasional cough. No conversational dyspnea.   LABORATORY DATA:  No labs for this visit.     RADIOGRAPHIC STUDIES: I have personally reviewed the radiological images as listed and agreed with the findings in the report. No results found.   ASSESSMENT & PLAN: 55 yo female   Flu, symptomatic  Back, knee, foot pain - MRI 04/16/21 showed mild multilevel degenerative changes Malignant neoplasm of upper-inner quadrant of right breast in female, invasive ductal carcinoma, cT2N1M0 stage IB, ER+/PR+/HER2+, G3  -Diagnosed 02/2018, s/p neoadjuvant TCHP, R mastectomy/ALND, and adjuvant radiation. She completed 1 year maintenance herceptin/perjeta in 03/2019 and Neratinib 08/2019 - 2/022. -she started anti-estrogen therapy 12/2018, did not tolerate tamoxifen, anastrozole, or  letrozole. On exemestane since 04/2019 tolerating well until 10/2020 -Try tamoxifen again briefly but tolerated poorly and resumed exemestane in 12/2020 -s/p reconstruction by Dr. Iran Planas 07/26/19 -exam at visit 08/30/20 showed palpable deep nodule RUQ right breast, US showed benign cyst. Otherwise doing well -no evidence of recurrence, continue surveillance -Mammo 04/2021 was negative -Next routine surveillance follow-up 08/19/21, or sooner if needed  PLAN: -patient got the flu from her daughter, she is symptomatic with cough, body aches, chills, and subjective fever. Continue hydration and supportive care. Tamiflu Rx sent in. Reviewed return precautions. Educated on s/sx of secondary infection -persistent back, knee, foot  pain is multifactorial. Continue ortho and podiatry f/up. OK to hold exemestane periodically for up to a week for trips or occasions where she will need to be mobile/active -mammogram reviewed, negative -next f/up 08/19/21, or sooner if needed   I discussed the assessment and treatment plan with the patient. The patient was provided an opportunity to ask questions and all were answered. The patient agreed with the plan and demonstrated an understanding of the instructions.   The patient was advised to call back or seek an in-person evaluation if the symptoms worsen or if the condition fails to improve as anticipated. Total encounter time was 10 minutes.      Alla Feeling, NP 06/24/21

## 2021-06-24 ENCOUNTER — Inpatient Hospital Stay: Payer: Medicaid Other | Attending: Nurse Practitioner | Admitting: Nurse Practitioner

## 2021-06-24 ENCOUNTER — Encounter: Payer: Self-pay | Admitting: Nurse Practitioner

## 2021-06-24 DIAGNOSIS — C50211 Malignant neoplasm of upper-inner quadrant of right female breast: Secondary | ICD-10-CM

## 2021-06-24 DIAGNOSIS — Z170 Estrogen receptor positive status [ER+]: Secondary | ICD-10-CM | POA: Diagnosis not present

## 2021-06-24 MED ORDER — OSELTAMIVIR PHOSPHATE 75 MG PO CAPS: 75.0000 mg | ORAL_CAPSULE | Freq: Two times a day (BID) | ORAL | 0 refills | Status: DC

## 2021-06-25 ENCOUNTER — Other Ambulatory Visit: Payer: Self-pay | Admitting: Nurse Practitioner

## 2021-06-26 ENCOUNTER — Telehealth: Payer: Self-pay

## 2021-06-26 NOTE — Telephone Encounter (Signed)
Pt called today to speak with Cira Rue, NP regarding her worsening symptoms.  Pt stated Lacie ordered Tamiflu but it does not seem to be working.  Pt said she is now experiencing ringing in her ears and have fluid in her ears.  Pt would like for Cira Rue, NP to give her a call or possibly see her.  Notified Cira Rue, NP of the pt's symptoms and request.

## 2021-07-01 ENCOUNTER — Telehealth: Payer: Self-pay

## 2021-07-01 NOTE — Telephone Encounter (Signed)
Pt called regarding her worsening symptoms, per Regan Rakers Burton,NP pt to see PCP. When called back pt said she had already scheduled to see her PCP and will follow up with them. Pt had no questions at this time.

## 2021-07-04 ENCOUNTER — Encounter: Payer: Self-pay | Admitting: Hematology

## 2021-07-15 ENCOUNTER — Other Ambulatory Visit: Payer: Self-pay | Admitting: Nurse Practitioner

## 2021-07-15 DIAGNOSIS — F419 Anxiety disorder, unspecified: Secondary | ICD-10-CM

## 2021-07-17 ENCOUNTER — Other Ambulatory Visit: Payer: Self-pay | Admitting: Hematology

## 2021-07-17 DIAGNOSIS — F419 Anxiety disorder, unspecified: Secondary | ICD-10-CM

## 2021-07-17 MED ORDER — ALPRAZOLAM 0.5 MG PO TABS: 0.5000 mg | ORAL_TABLET | Freq: Two times a day (BID) | ORAL | 0 refills | Status: DC | PRN

## 2021-08-02 ENCOUNTER — Telehealth: Payer: Self-pay

## 2021-08-02 ENCOUNTER — Other Ambulatory Visit: Payer: Self-pay

## 2021-08-02 ENCOUNTER — Other Ambulatory Visit: Payer: Self-pay | Admitting: Hematology

## 2021-08-02 MED ORDER — TRAMADOL HCL 50 MG PO TABS: ORAL_TABLET | ORAL | 0 refills | Status: DC

## 2021-08-02 MED ORDER — IBUPROFEN 800 MG PO TABS: 800.0000 mg | ORAL_TABLET | Freq: Three times a day (TID) | ORAL | 0 refills | Status: DC | PRN

## 2021-08-02 NOTE — Telephone Encounter (Signed)
Pt LVM stating her sinuses have not gotten better after taking several Abx and seeing several providers.  Pt stated she is having severe Migraines and does not know what else to do.  Pt stated she cannot continue to live like this and is requesting Dr. Ernestina Penna opinion on what she should do.  Sent message to Dr. Burr Medico regarding pt's c/o for Dr. Burr Medico to follow-up with pt.

## 2021-08-05 ENCOUNTER — Other Ambulatory Visit: Payer: Self-pay | Admitting: *Deleted

## 2021-08-05 ENCOUNTER — Telehealth: Payer: Self-pay | Admitting: Internal Medicine

## 2021-08-05 ENCOUNTER — Other Ambulatory Visit: Payer: Self-pay

## 2021-08-05 DIAGNOSIS — C50211 Malignant neoplasm of upper-inner quadrant of right female breast: Secondary | ICD-10-CM

## 2021-08-05 NOTE — Telephone Encounter (Signed)
Scheduled appt per 1/9 staff msg from [place]. Pt is aware of appt date and time.

## 2021-08-08 ENCOUNTER — Other Ambulatory Visit: Payer: Self-pay

## 2021-08-08 ENCOUNTER — Inpatient Hospital Stay: Payer: Medicaid Other | Attending: Nurse Practitioner | Admitting: Internal Medicine

## 2021-08-08 DIAGNOSIS — H53149 Visual discomfort, unspecified: Secondary | ICD-10-CM | POA: Insufficient documentation

## 2021-08-08 DIAGNOSIS — G43909 Migraine, unspecified, not intractable, without status migrainosus: Secondary | ICD-10-CM | POA: Diagnosis present

## 2021-08-08 DIAGNOSIS — Z800 Family history of malignant neoplasm of digestive organs: Secondary | ICD-10-CM | POA: Diagnosis not present

## 2021-08-08 DIAGNOSIS — C50211 Malignant neoplasm of upper-inner quadrant of right female breast: Secondary | ICD-10-CM | POA: Insufficient documentation

## 2021-08-08 DIAGNOSIS — F419 Anxiety disorder, unspecified: Secondary | ICD-10-CM | POA: Diagnosis not present

## 2021-08-08 DIAGNOSIS — Z170 Estrogen receptor positive status [ER+]: Secondary | ICD-10-CM | POA: Diagnosis not present

## 2021-08-08 DIAGNOSIS — Z808 Family history of malignant neoplasm of other organs or systems: Secondary | ICD-10-CM | POA: Insufficient documentation

## 2021-08-08 DIAGNOSIS — Z79899 Other long term (current) drug therapy: Secondary | ICD-10-CM | POA: Insufficient documentation

## 2021-08-08 DIAGNOSIS — R519 Headache, unspecified: Secondary | ICD-10-CM

## 2021-08-08 DIAGNOSIS — M810 Age-related osteoporosis without current pathological fracture: Secondary | ICD-10-CM | POA: Insufficient documentation

## 2021-08-08 DIAGNOSIS — C773 Secondary and unspecified malignant neoplasm of axilla and upper limb lymph nodes: Secondary | ICD-10-CM | POA: Diagnosis not present

## 2021-08-08 MED ORDER — DEXAMETHASONE 4 MG PO TABS: 4.0000 mg | ORAL_TABLET | Freq: Every day | ORAL | 0 refills | Status: DC

## 2021-08-08 NOTE — Progress Notes (Signed)
[facility] [place], [place] [address] [phone]   New Patient Evaluation  Date of Service: 08/08/21 Patient Name: Claire Guzman Patient MRN: [id number] Patient DOB: 08-Feb-1966 Provider: Ventura Sellers, MD  Identifying Statement:  Claire Guzman is a 56 y.o. female with Acute nonintractable headache, unspecified headache type who presents for initial consultation and evaluation regarding cancer associated neurologic deficits.    Referring Provider: Lujean Amel, MD [address],  [place] [address]  Primary Cancer:  Oncologic History: Oncology History Overview Note  Cancer Staging Malignant neoplasm of upper-inner quadrant of right breast in female, estrogen receptor positive ([place]) Staging form: Breast, AJCC 8th Edition - Clinical stage from 03/08/2018: Stage IB (cT2, cN1, cM0, G3, ER+, PR+, HER2+) - Signed by Truitt Merle, MD on 03/16/2018     Malignant neoplasm of upper-inner quadrant of right breast in female, estrogen receptor positive ([place])  02/24/2018 Mammogram   screening mammogram on 02/24/2018 that was benign   03/04/2018 Mammogram   diagnostic mammogram on 03/04/2018 due to a palpable mass on her right breast. Diagnostic mammogram, breast US revealed and biopsy revealed suspicious mass in the right breast at 2:30 at the palpable site of concern and one suspicious right axillary LN.   03/08/2018 Cancer Staging   Staging form: Breast, AJCC 8th Edition - Clinical stage from 03/08/2018: Stage IB (cT2, cN1, cM0, G3, ER+, PR+, HER2+) - Signed by Truitt Merle, MD on 03/16/2018    03/08/2018 Receptors her2   Her2 positive, ER 90% PR 5% and Ki67 60%   03/08/2018 Pathology Results   Diagnosis 1. Breast, right, needle core biopsy, 2:30 - INVASIVE DUCTAL CARCINOMA, GRADE 3. - LYMPHOVASCULAR SPACE INVOLVEMENT BY TUMOR. 2. Lymph node, needle/core biopsy, right axilla - METASTATIC CARCINOMA  INVOLVING SCANT LYMPHOID TISSUE.   03/10/2018 Initial Diagnosis   Malignant neoplasm of upper-inner quadrant of right breast in female, estrogen receptor positive ([place])   03/20/2018 Imaging   MRI brain 03/20/18 IMPRESSION: No evidence of metastatic disease.  Normal appearance of brain. Slightly heterogeneous marrow pattern of the upper cervical spine, nonspecific but likely benign, especially in the setting of early stage disease.    03/24/2018 Echocardiogram   Baseline ECHO 03/24/18 Study Conclusions - Left ventricle: The cavity size was normal. Wall thickness was   increased in a pattern of mild LVH. Systolic function was normal.   The estimated ejection fraction was in the range of 60% to 65%.   Wall motion was normal; there were no regional wall motion   abnormalities. Doppler parameters are consistent with abnormal   left ventricular relaxation (grade 1 diastolic dysfunction). - Right ventricle: The cavity size was mildly dilated. Wall   thickness was normal.   03/25/2018 Imaging   MRI Breast B/l 03/25/18 IMPRESSION: 1. The patient's primary malignancy in the posterior right breast measures 3.2 x 2 x 2.6 cm with apparent invasion of the underlying pectoralis muscle. Numerous surrounding abnormal enhancing masses, consistent with satellite lesions, extend from 11 o'clock in the right breast into the inferolateral right breast with a total span of suspected disease measuring 4.5 x 4.6 x 6.4 cm in AP, transverse, and craniocaudal dimension. The suspected extent of disease involves the superior and inferolateral quadrants. The primary malignancy also extends just across midline into the superior medial quadrant. 2. No MRI evidence of malignancy in the left breast. 3. Known metastatic noted in the right axilla.   03/25/2018 Imaging  Whole body bone scan 03/25/18 IMPRESSION: No scintigraphic evidence skeletal metastasis.   03/25/2018 Imaging   CT CAP W Contrast  03/25/18 IMPRESSION: 1. Mass within the deep aspect of the right breast along the pectoralis muscle compatible with primary breast malignancy. 2. There is suggestion of two additional possible masses within the lateral aspect of the right breast versus nodular appearing breast tissue. Recommend dedicated evaluation with bilateral breast MRI. 3. Mildly thickened right axillary lymph node compatible with metastatic adenopathy, recently biopsied. 4. Multiple bilateral pulmonary nodules are indeterminate, potentially sequelae of prior infectious/inflammatory process. Recommend attention on follow-up. Consider follow-up CT in 6 months. 5. Portal venous gas is demonstrated within the left hepatic lobe. Additionally, there is wall thickening of the cecum and ascending colon with small amount of gas within the pericolonic vasculature. Constellation of findings is indeterminate in etiology however may be secondary to colitis at this location with considerations including infectious, inflammatory or ischemic etiologies. Correlate for recent procedure. If patient is not up-to-date for colonic screening, recommend further evaluation with colonoscopy to exclude the possibility of colonic mass within the cecum/ascending colon. 6. These results were called by telephone at the time of interpretation on 03/25/2018 at 9:31 am to Dr. Truitt Merle , who verbally acknowledged these results.   03/30/2018 - 04/08/2019 Chemotherapy   Neoadjuvant TCHP every 3 weeks starting 03/30/18. Completed 6 cycles of TCHP on 07/23/18.  Continued with maintenance Herceptin/Perjeta q3weeks to complete 1 year.    04/09/2018 Genetic Testing   Negative genetic testing on the [facility] Common Hereditary Cancers Panel. The Common Hereditary Cancers Panel offered by [facility] includes sequencing and/or deletion duplication testing of the following 47 genes: APC, ATM, AXIN2, BARD1, BMPR1A, BRCA1, BRCA2, BRIP1, CDH1, CDKN2A (p14ARF), CDKN2A  (p16INK4a), CKD4, CHEK2, CTNNA1, DICER1, EPCAM (Deletion/duplication testing only), GREM1 (promoter region deletion/duplication testing only), KIT, MEN1, MLH1, MSH2, MSH3, MSH6, MUTYH, NBN, NF1, NHTL1, PALB2, PDGFRA, PMS2, POLD1, POLE, PTEN, RAD50, RAD51C, RAD51D, SDHB, SDHC, SDHD, SMAD4, SMARCA4. STK11, TP53, TSC1, TSC2, and VHL.  The following genes were evaluated for sequence changes only: SDHA and HOXB13 c.251G>A variant only.   Genetic testing did detect a Variant of Unknown Significance (VUS) in the POLD1 gene called c.985C>T (p.Pro329Ser). At this time, it is unknown if this variant is associated with increased cancer risk or if this is a normal finding, but most variants such as this get reclassified to being inconsequential. It should not be used to make medical management decisions.   The report date is 04/09/2018.   07/24/2018 Breast MRI   IMPRESSION: 1. Resolution of the enhancing mass (known cancer) and small satellite lesions previously seen in the right breast following chemotherapy.   2. The metastatic lymph node in the right axilla has decreased in size.   3.  No MRI evidence of left breast malignancy.   09/14/2018 Surgery   RIGHT NIPPLE SPARING MASTECTOMY WITH TARGETED DEEP RIGHT AXILLARY LYMPH NODE BIOPSY WITH RADIOACTIVE SEED LOCALIZATION AND RIGHT AXILLARY SENTINEL LYMPH NODE BIOPSY by Dr. Dalbert Batman  09/14/18    09/14/2018 Pathology Results   Diagnosis 1. Lymph node, sentinel, biopsy, right axillary with radioactive seed - ONE OF ONE LYMPH NODES NEGATIVE FOR CARCINOMA (0/1). - MILD TREATMENT EFFECT. - BIOPSY SITE. 2. Lymph node, sentinel, biopsy, right axillary - ONE OF ONE LYMPH NODES NEGATIVE FOR CARCINOMA (0/1). 3. Lymph node, sentinel, biopsy, right - ONE OF ONE LYMPH NODES NEGATIVE FOR CARCINOMA (0/1). 4. Lymph node, sentinel, biopsy, right - ONE OF ONE LYMPH NODES NEGATIVE  FOR CARCINOMA (0/1). 5. Lymph node, sentinel, biopsy, right - ONE OF ONE LYMPH NODES  NEGATIVE FOR CARCINOMA (0/1). 6. Lymph node, sentinel, biopsy, right - ONE OF ONE LYMPH NODES NEGATIVE FOR CARCINOMA (0/1). 7. Lymph node, sentinel, biopsy, right - ONE OF ONE LYMPH NODES NEGATIVE FOR CARCINOMA (0/1). 8. Lymph node, sentinel, biopsy, right - ONE OF ONE LYMPH NODES NEGATIVE FOR CARCINOMA (0/1). 9. Lymph node, sentinel, biopsy, right - ONE OF ONE LYMPH NODES NEGATIVE FOR CARCINOMA (0/1). 10. Lymph node, sentinel, biopsy, right - ONE OF ONE LYMPH NODES NEGATIVE FOR CARCINOMA (0/1). 11. Lymph node, sentinel, biopsy, right - ONE OF ONE LYMPH NODES NEGATIVE FOR CARCINOMA (0/1). 12. Lymph node, sentinel, biopsy, right - ONE OF ONE LYMPH NODES NEGATIVE FOR CARCINOMA (0/1). 13. Nipple Biopsy, right - BENIGN NIPPLE TISSUE. 14. Breast, simple mastectomy, right - NO RESIDUAL CARCINOMA IDENTIFIED. - TREATMENT EFFECT. - FIBROCYSTIC CHANGE. - ONE OF ONE LYMPH NODES NEGATIVE FOR CARCINOMA (0/1). - SEE ONCOLOGY TABLE.   09/14/2018 Cancer Staging   Staging form: Breast, AJCC 8th Edition - Pathologic stage from 09/14/2018: No Stage Recommended (ypT0, pN0, cM0, GX, ER: Not Assessed, PR: Not Assessed, HER2: Not Assessed) - Signed by Truitt Merle, MD on 09/23/2018    11/03/2018 - 12/10/2018 Radiation Therapy   Adjuvant Radiation with Dr. Isidore Moos    12/27/2018 -  Anti-estrogen oral therapy   Start Tamoxifen 104m daily in 12/27/2018 stopped after 1 week due to poor toleration. She started anastrozole on 02/04/19 but stopped on 02/14/19 due to worsening muscle aches. She tried Letrozole but did not tolerate. She was switched to Exemestane in late 04/2019.    01/04/2019 Imaging   CT chest  IMPRESSION: 1. Stable tiny subpleural and perifissural nodules bilaterally. No suspicious pulmonary nodule or mass. 2. No focal airspace disease in the right lung. 3.  Aortic Atherosclerois (ICD10-170.0)     03/08/2019 Imaging   CT AP W Contrast 03/08/19  IMPRESSION: 1. No metastatic disease in the abdomen  pelvis. 2. No evidence adverse immunotherapy therapy reaction.   03/08/2019 Imaging   Whole body bone scan 03/08/19  IMPRESSION: No definite scintigraphic evidence of osseous metastases.   08/29/2019 - 08/29/2020 Chemotherapy   Started Nerlynx 2462mdaily with budesonide on 08/29/19. Reduced to 20035mn late 08/2019. Completed on 08/29/20       History of Present Illness: The patient's records from the referring physician were obtained and reviewed and the patient interviewed to confirm this HPI.  Claire Dalportoesents to review recent headache syndrome.  She describes a frontal headache, 8/10, lasting for hours, associated with photophobia.  This developed in the days following a prolonged and severe sinus infection.  She has otherwise no history of headaches, migraines.  No associated neurologic symptoms or deficits.  Symptoms improved following steroid intramuscular injection and toradol injection.  Today she has only very mild symptoms, most of the pain has resolved.  Continues on oral estrogen agents with Dr. FenBurr Medicor ER/HER+ breast cancer.  High stress burden with family and career issues.  Medications: Current Outpatient Medications on File Prior to Visit  Medication Sig Dispense Refill   ALPRAZolam (XANAX) 0.5 MG tablet Take 1 tablet (0.5 mg total) by mouth 2 (two) times daily as needed for anxiety. 30 tablet 0   atenolol (TENORMIN) 50 MG tablet Take 50 mg by mouth at bedtime.     Cholecalciferol (VITAMIN D3) 50 MCG (2000 UT) capsule Take 2,000 Units by mouth daily.  exemestane (AROMASIN) 25 MG tablet TAKE 1 TABLET(25 MG) BY MOUTH DAILY AFTER BREAKFAST 90 tablet 1   ibuprofen (ADVIL) 800 MG tablet Take 1 tablet (800 mg total) by mouth every 8 (eight) hours as needed. 20 tablet 0   lactulose (CHRONULAC) 10 GM/15ML solution TAKE 20 MLS BY MOUTH EVERY 4 HOURS AS NEEDED FOR CONSTIPATION 237 mL 0   meloxicam (MOBIC) 15 MG tablet TAKE 1 TABLET(15 MG) BY MOUTH DAILY WITH FOOD AS NEEDED FOR  PAIN 20 tablet 0   potassium chloride SA (KLOR-CON) 20 MEQ tablet Take 1 tablet (20 mEq total) by mouth daily. 30 tablet 3   traMADol (ULTRAM) 50 MG tablet TAKE 1-2 TABLET BY MOUTH EVERY 12 HOURS AS NEEDED 20 tablet 0   valACYclovir (VALTREX) 500 MG tablet valacyclovir 500 mg tablet  TAKE 1 TABLET BY MOUTH TWICE DAILY     venlafaxine XR (EFFEXOR-XR) 150 MG 24 hr capsule Take 1 capsule (150 mg total) by mouth daily with breakfast. 30 capsule 5   Zinc 50 MG TABS Take 1 tablet by mouth daily.     [DISCONTINUED] omeprazole (PRILOSEC) 20 MG capsule Take 1 capsule (20 mg total) by mouth 2 (two) times daily before a meal. (Patient not taking: Reported on 01/03/2020) 60 capsule 2   [DISCONTINUED] prochlorperazine (COMPAZINE) 10 MG tablet TAKE 1 TABLET BY MOUTH EVERY 6 HOURS AS NEEDED FOR NAUSEA OR VOMITING 30 tablet 2   No current facility-administered medications on file prior to visit.    Allergies:  Allergies  Allergen Reactions   Metrogel [Metronidazole]     Burned her face   Other Other (See Comments)    Dental Cement--tongue and cheek burning   Paxil [Paroxetine Hcl]     Lethargic/felt bad    Sulfamethoxazole Swelling   Zomig [Zolmitriptan]     Elevated blood pressure    Prednisone     Turns skin red, heart racing- side effect   Past Medical History:  Past Medical History:  Diagnosis Date   Abdominal pain, unspecified site    Anxiety state, unspecified    Complication of anesthesia    convulsions - when she has appendectomy about 12 years ago at [facility]   Contact dermatitis and other eczema, due to unspecified cause    Essential hypertension, benign    Family history of colon cancer    Family history of melanoma    Family history of prostate cancer    GERD (gastroesophageal reflux disease)    Hematuria, unspecified    Hypertension    Lower back pain    Mixed hyperlipidemia    Rosacea    rt breast ca dx'd 01/2018   R breast cancer   Unspecified symptom associated with  female genital organs    Past Surgical History:  Past Surgical History:  Procedure Laterality Date   BREAST RECONSTRUCTION WITH PLACEMENT OF TISSUE EXPANDER AND ALLODERM Right 09/14/2018   Procedure: BREAST RECONSTRUCTION WITH PLACEMENT OF TISSUE EXPANDER AND ALLODERM;  Surgeon: Irene Limbo, MD;  Location: [facility];  Service: Plastics;  Laterality: Right;   CESAREAN SECTION     DIAGNOSTIC LAPAROSCOPY     EYE SURGERY     Lasik   LAPAROSCOPIC APPENDECTOMY     LIPOSUCTION WITH LIPOFILLING Right 07/26/2019   Procedure: LIPOFILLING from abdomen to R chest;  Surgeon: Irene Limbo, MD;  Location: [facility];  Service: Plastics;  Laterality: Right;   MASTECTOMY WITH RADIOACTIVE SEED GUIDED EXCISION AND AXILLARY SENTINEL LYMPH NODE BIOPSY Right  09/14/2018   Procedure: RIGHT NIPPLE SPARING MASTECTOMY WITH TARGETED DEEP RIGHT AXILLARY LYMPH NODE BIOPSY WITH RADIOACTIVE SEED LOCALIZATION AND RIGHT AXILLARY SENTINEL LYMPH NODE BIOPSY, FROZEN SECTION AND POSSIBLE COMPLETION AXILLARY LYMPH NODE DISSECTION INJECT BLUE DYE RIGHT BREAST;  Surgeon: Fanny Skates, MD;  Location: [facility];  Service: General;  Laterality: Right;   PORT-A-CATH REMOVAL Left 07/26/2019   Procedure: REMOVAL PORT-A-CATH;  Surgeon: Irene Limbo, MD;  Location: [facility];  Service: Plastics;  Laterality: Left;   PORTACATH PLACEMENT Left 04/13/2018   Procedure: INSERTION PORT-A-CATH WITH [place];  Surgeon: Fanny Skates, MD;  Location: [facility];  Service: General;  Laterality: Left;   REMOVAL OF TISSUE EXPANDER AND PLACEMENT OF IMPLANT Right 07/26/2019   Procedure: REMOVAL OF TISSUE EXPANDER AND PLACEMENT OF IMPLANT;  Surgeon: Irene Limbo, MD;  Location: [facility];  Service: Plastics;  Laterality: Right;   UTERINE FIBROID SURGERY     Social History:  Social History   Socioeconomic History   Marital status: Married    Spouse name: Not on file   Number of  children: 1   Years of education: Not on file   Highest education level: Not on file  Occupational History   Not on file  Tobacco Use   Smoking status: Never   Smokeless tobacco: Never  Vaping Use   Vaping Use: Never used  Substance and Sexual Activity   Alcohol use: No   Drug use: Never   Sexual activity: Not on file  Other Topics Concern   Not on file  Social History Narrative   Not on file   Social Determinants of Health   Financial Resource Strain: Not on file  Food Insecurity: Not on file  Transportation Needs: Not on file  Physical Activity: Not on file  Stress: Not on file  Social Connections: Not on file  Intimate Partner Violence: Not on file   Family History:  Family History  Problem Relation Age of Onset   Heart failure Mother    Melanoma Mother 87       has had several removed over the past ten years   Prostate cancer Father 15   Stroke Father    Heart attack Maternal Grandmother    Heart attack Maternal Grandfather        possible cancer, unknown type   Stroke Paternal Grandmother    Colon cancer Paternal Grandfather        dx upper 71s   Hypertension Other    Heart Problems Maternal Aunt        d.60   Heart Problems Maternal Uncle        d.60   Colon cancer Paternal Aunt        dx 70s   Colon cancer Paternal Uncle        dx 66s    Review of Systems: Constitutional: Doesn't report fevers, chills or abnormal weight loss Eyes: Doesn't report blurriness of vision Ears, nose, mouth, throat, and face: Doesn't report sore throat Respiratory: Doesn't report cough, dyspnea or wheezes Cardiovascular: Doesn't report palpitation, chest discomfort  Gastrointestinal:  Doesn't report nausea, constipation, diarrhea GU: Doesn't report incontinence Skin: Doesn't report skin rashes Neurological: Per HPI Musculoskeletal: Doesn't report joint pain Behavioral/Psych: Doesn't report anxiety  Physical Exam: Wt Readings from Last 3 Encounters:  05/14/21 168 lb  (76.2 kg)  03/27/21 168 lb 6.4 oz (76.4 kg)  03/18/21 167 lb (75.8 kg)   Temp Readings from Last 3 Encounters:  03/27/21  98.9 F (37.2 C) (Oral)  03/18/21 98.6 F (37 C) (Oral)  02/15/21 98.5 F (36.9 C) (Oral)   BP Readings from Last 3 Encounters:  05/14/21 133/68  03/27/21 (!) 143/86  03/18/21 (!) 142/88   Pulse Readings from Last 3 Encounters:  05/14/21 65  03/27/21 78  03/18/21 66   KPS: 90. General: Alert, cooperative, pleasant, in no acute distress Head: Normal EENT: No conjunctival injection or scleral icterus.  Lungs: Resp effort normal Cardiac: Regular rate Abdomen: Non-distended abdomen Skin: No rashes cyanosis or petechiae. Extremities: No clubbing or edema  Neurologic Exam: Mental Status: Awake, alert, attentive to examiner. Oriented to self and environment. Language is fluent with intact comprehension.  Cranial Nerves: Visual acuity is grossly normal. Visual fields are full. Extra-ocular movements intact. No ptosis. Face is symmetric Motor: Tone and bulk are normal. Power is full in both arms and legs. Reflexes are symmetric, no pathologic reflexes present.  Sensory: Intact to light touch Gait: Normal.   Labs: I have reviewed the data as listed    Component Value Date/Time   NA 143 03/18/2021 0917   K 3.5 03/18/2021 0917   CL 106 03/18/2021 0917   CO2 26 03/18/2021 0917   GLUCOSE 92 03/18/2021 0917   BUN 12 03/18/2021 0917   CREATININE 0.70 03/18/2021 0917   CALCIUM 9.9 03/18/2021 0917   PROT 7.6 03/18/2021 0917   ALBUMIN 4.3 03/18/2021 0917   AST 17 03/18/2021 0917   ALT 14 03/18/2021 0917   ALKPHOS 84 03/18/2021 0917   BILITOT 0.6 03/18/2021 0917   GFRNONAA >60 03/18/2021 0917   GFRAA >60 03/02/2020 1007   Lab Results  Component Value Date   WBC 4.8 03/18/2021   NEUTROABS 2.3 03/18/2021   HGB 13.6 03/18/2021   HCT 36.6 03/18/2021   MCV 88.6 03/18/2021   PLT 181 03/18/2021     Assessment/Plan Acute nonintractable headache,  unspecified headache type  Claire Guzman presents with clinical syndrome consistent with acute headache with migrainous features.  This likely started as a tension/sinus headache, and evolved into migraine type.  Evolution may have been driven partly by stress and work burden during her illness.    Clinically she was aided by PRN ibuprofen, as well as toradol injection.  We are certainly ok with PRN NSAIDs.  We also discussed utility of course of oral decadron if similar syndrome recurred.  This may be preferable to chronic dosing of headache prevention medication. We spent twenty additional minutes teaching regarding the natural history, biology, and historical experience in the treatment of neurologic complications of cancer.   We appreciate the opportunity to participate in the care of [employer].   All questions were answered. The patient knows to call the clinic with any problems, questions or concerns. No barriers to learning were detected.  The total time spent in the encounter was 40 minutes and more than 50% was on counseling and review of test results   Ventura Sellers, MD Medical Director of Neuro-Oncology [facility] at [place] 08/08/21 4:43 PM

## 2021-08-16 ENCOUNTER — Other Ambulatory Visit: Payer: Self-pay

## 2021-08-16 DIAGNOSIS — C50211 Malignant neoplasm of upper-inner quadrant of right female breast: Secondary | ICD-10-CM

## 2021-08-19 ENCOUNTER — Inpatient Hospital Stay (HOSPITAL_BASED_OUTPATIENT_CLINIC_OR_DEPARTMENT_OTHER): Payer: Medicaid Other | Admitting: Hematology

## 2021-08-19 ENCOUNTER — Other Ambulatory Visit: Payer: Self-pay

## 2021-08-19 ENCOUNTER — Inpatient Hospital Stay: Payer: Medicaid Other | Admitting: Hematology

## 2021-08-19 ENCOUNTER — Encounter: Payer: Self-pay | Admitting: Hematology

## 2021-08-19 VITALS — BP 127/67 | HR 61 | Temp 98.6°F | Resp 15 | Wt 164.9 lb

## 2021-08-19 DIAGNOSIS — C50211 Malignant neoplasm of upper-inner quadrant of right female breast: Secondary | ICD-10-CM | POA: Diagnosis not present

## 2021-08-19 DIAGNOSIS — Z170 Estrogen receptor positive status [ER+]: Secondary | ICD-10-CM

## 2021-08-19 DIAGNOSIS — F419 Anxiety disorder, unspecified: Secondary | ICD-10-CM | POA: Diagnosis not present

## 2021-08-19 LAB — CBC WITH DIFFERENTIAL/PLATELET
Abs Immature Granulocytes: 0.02 10*3/uL (ref 0.00–0.07)
Basophils Absolute: 0 10*3/uL (ref 0.0–0.1)
Basophils Relative: 0 %
Eosinophils Absolute: 0.1 10*3/uL (ref 0.0–0.5)
Eosinophils Relative: 2 %
HCT: 36.8 % (ref 36.0–46.0)
Hemoglobin: 13.2 g/dL (ref 12.0–15.0)
Immature Granulocytes: 0 %
Lymphocytes Relative: 41 %
Lymphs Abs: 2.2 10*3/uL (ref 0.7–4.0)
MCH: 32.4 pg (ref 26.0–34.0)
MCHC: 35.9 g/dL (ref 30.0–36.0)
MCV: 90.2 fL (ref 80.0–100.0)
Monocytes Absolute: 0.3 10*3/uL (ref 0.1–1.0)
Monocytes Relative: 5 %
Neutro Abs: 2.8 10*3/uL (ref 1.7–7.7)
Neutrophils Relative %: 52 %
Platelets: 164 10*3/uL (ref 150–400)
RBC: 4.08 MIL/uL (ref 3.87–5.11)
RDW: 12.2 % (ref 11.5–15.5)
WBC: 5.4 10*3/uL (ref 4.0–10.5)
nRBC: 0 % (ref 0.0–0.2)

## 2021-08-19 LAB — COMPREHENSIVE METABOLIC PANEL
ALT: 13 U/L (ref 0–44)
AST: 16 U/L (ref 15–41)
Albumin: 4.4 g/dL (ref 3.5–5.0)
Alkaline Phosphatase: 85 U/L (ref 38–126)
Anion gap: 6 (ref 5–15)
BUN: 18 mg/dL (ref 6–20)
CO2: 29 mmol/L (ref 22–32)
Calcium: 9.2 mg/dL (ref 8.9–10.3)
Chloride: 102 mmol/L (ref 98–111)
Creatinine, Ser: 0.58 mg/dL (ref 0.44–1.00)
GFR, Estimated: >60 mL/min (ref >60)
Glucose, Bld: 97 mg/dL (ref 70–99)
Potassium: 3 mmol/L — ABNORMAL LOW (ref 3.5–5.1)
Sodium: 137 mmol/L (ref 135–145)
Total Bilirubin: 0.7 mg/dL (ref 0.3–1.2)
Total Protein: 7.9 g/dL (ref 6.5–8.1)

## 2021-08-19 LAB — MAGNESIUM: Magnesium: 2 mg/dL (ref 1.7–2.4)

## 2021-08-19 MED ORDER — ALPRAZOLAM 0.5 MG PO TABS: 0.5000 mg | ORAL_TABLET | Freq: Two times a day (BID) | ORAL | 0 refills | Status: DC | PRN

## 2021-08-19 NOTE — Progress Notes (Signed)
[place]   [phone] [phone]   Clinic Follow up Note   Patient Care Team: Lujean Amel, MD as PCP - General (Family Medicine) Fanny Skates, MD as Consulting Physician (General Surgery) Truitt Merle, MD as Consulting Physician (Hematology) Eppie Gibson, MD as Attending Physician (Radiation Oncology) Mauro Kaufmann, RN as Oncology Nurse Navigator Rockwell Germany, RN as Oncology Nurse Navigator  Date of Service:  08/19/2021  CHIEF COMPLAINT: f/u of right breast cancer  CURRENT THERAPY:  -Adjuvant antiestrogen therapy, held in 05/2021 due to poor tolerance  -Zometa q6 months x2 years, started 03/02/20  ASSESSMENT & PLAN:  Claire Guzman is a 56 y.o. female with   1. Malignant neoplasm of upper-inner quadrant of right breast in female, invasive ductal carcinoma, cT2N1M0 stage IB, ER+/PR+/HER2+, G3  -Diagnosed 02/2018, s/p neoadjuvant TCHP, R mastectomy/ALND, and adjuvant radiation. She completed 1 year maintenance herceptin/perjeta in 03/2019 and Neratinib 08/2019 - 08/2020. -s/p reconstruction by Dr. Iran Planas 07/26/19 -she started anti-estrogen therapy 12/2018, did not tolerate tamoxifen, anastrozole, letrozole, or exemestane. -most recent left mammogram on 05/10/21 was negative. -she held the exemestane since 06/24/21, and her joint pain has essentially resolved. Given she has only received two years of antiestrogen therapy, I recommend she try low dose exemestane. She agrees -she is clinically doing well, no evidence of recurrence, continue surveillance -Next mammo 04/2022  2. Headaches -started out as sinus/tension headaches and developed into migraines -she saw Dr. Mickeal Skinner on 08/08/21 and was prescribed dexamethasone to use as needed.  3. Osteoporosis -DEXA on 08/26/19 showed osteoporosis of left femur (T-score of -2.7) -she started Zometa q58month in 02/2020, next dose due 08/2021. -I advised her to take calcium and vit D. -will  plan to repeat DEXA in 12/2021  4. Anxiety -has a history of anxiety prior to diagnosis. -she reports social stressors that attribute to her anxiety, and she has had panic attacks lately. -she is currently on Xanax as needed. I will refill for her today.   PLAN: -she will restart half-dose exemestane daily -Zometa in a month (OK to use today's lab result) -lab and f/u in 6 months with DEXA a week before -mammogram due 04/2022, will order at next visit   No problem-specific Assessment & Plan notes found for this encounter.   SUMMARY OF ONCOLOGIC HISTORY: Oncology History Overview Note  Cancer Staging Malignant neoplasm of upper-inner quadrant of right breast in female, estrogen receptor positive ([facility] Staging form: Breast, AJCC 8th Edition - Clinical stage from 03/08/2018: Stage IB (cT2, cN1, cM0, G3, ER+, PR+, HER2+) - Signed by FTruitt Merle MD on 03/16/2018     Malignant neoplasm of upper-inner quadrant of right breast in female, estrogen receptor positive ([facility]  02/24/2018 Mammogram   screening mammogram on 02/24/2018 that was benign   03/04/2018 Mammogram   diagnostic mammogram on 03/04/2018 due to a palpable mass on her right breast. Diagnostic mammogram, breast [place]vealed and biopsy revealed suspicious mass in the right breast at 2:30 at the palpable site of concern and one suspicious right axillary LN.   03/08/2018 Cancer Staging   Staging form: Breast, AJCC 8th Edition - Clinical stage from 03/08/2018: Stage IB (cT2, cN1, cM0, G3, ER+, PR+, HER2+) - Signed by FTruitt Merle MD on 03/16/2018    03/08/2018 Receptors her2   Her2 positive, ER 90% PR 5% and Ki67 60%   03/08/2018 Pathology Results   Diagnosis 1. Breast, right, needle core biopsy, 2:30 - INVASIVE DUCTAL CARCINOMA, GRADE 3. -  LYMPHOVASCULAR SPACE INVOLVEMENT BY TUMOR. 2. Lymph node, needle/core biopsy, right axilla - METASTATIC CARCINOMA INVOLVING SCANT LYMPHOID TISSUE.   03/10/2018 Initial Diagnosis   Malignant  neoplasm of upper-inner quadrant of right breast in female, estrogen receptor positive ([place])   03/20/2018 Imaging   MRI brain 03/20/18 IMPRESSION: No evidence of metastatic disease.  Normal appearance of brain. Slightly heterogeneous marrow pattern of the upper cervical spine, nonspecific but likely benign, especially in the setting of early stage disease.    03/24/2018 Echocardiogram   Baseline ECHO 03/24/18 Study Conclusions - Left ventricle: The cavity size was normal. Wall thickness was   increased in a pattern of mild LVH. Systolic function was normal.   The estimated ejection fraction was in the range of 60% to 65%.   Wall motion was normal; there were no regional wall motion   abnormalities. Doppler parameters are consistent with abnormal   left ventricular relaxation (grade 1 diastolic dysfunction). - Right ventricle: The cavity size was mildly dilated. Wall   thickness was normal.   03/25/2018 Imaging   MRI Breast B/l 03/25/18 IMPRESSION: 1. The patient's primary malignancy in the posterior right breast measures 3.2 x 2 x 2.6 cm with apparent invasion of the underlying pectoralis muscle. Numerous surrounding abnormal enhancing masses, consistent with satellite lesions, extend from 11 o'clock in the right breast into the inferolateral right breast with a total span of suspected disease measuring 4.5 x 4.6 x 6.4 cm in AP, transverse, and craniocaudal dimension. The suspected extent of disease involves the superior and inferolateral quadrants. The primary malignancy also extends just across midline into the superior medial quadrant. 2. No MRI evidence of malignancy in the left breast. 3. Known metastatic noted in the right axilla.   03/25/2018 Imaging   Whole body bone scan 03/25/18 IMPRESSION: No scintigraphic evidence skeletal metastasis.   03/25/2018 Imaging   CT CAP W Contrast 03/25/18 IMPRESSION: 1. Mass within the deep aspect of the right breast along the pectoralis  muscle compatible with primary breast malignancy. 2. There is suggestion of two additional possible masses within the lateral aspect of the right breast versus nodular appearing breast tissue. Recommend dedicated evaluation with bilateral breast MRI. 3. Mildly thickened right axillary lymph node compatible with metastatic adenopathy, recently biopsied. 4. Multiple bilateral pulmonary nodules are indeterminate, potentially sequelae of prior infectious/inflammatory process. Recommend attention on follow-up. Consider follow-up CT in 6 months. 5. Portal venous gas is demonstrated within the left hepatic lobe. Additionally, there is wall thickening of the cecum and ascending colon with small amount of gas within the pericolonic vasculature. Constellation of findings is indeterminate in etiology however may be secondary to colitis at this location with considerations including infectious, inflammatory or ischemic etiologies. Correlate for recent procedure. If patient is not up-to-date for colonic screening, recommend further evaluation with colonoscopy to exclude the possibility of colonic mass within the cecum/ascending colon. 6. These results were called by telephone at the time of interpretation on 03/25/2018 at 9:31 am to Dr. Truitt Merle , who verbally acknowledged these results.   03/30/2018 - 04/08/2019 Chemotherapy   Neoadjuvant TCHP every 3 weeks starting 03/30/18. Completed 6 cycles of TCHP on 07/23/18.  Continued with maintenance Herceptin/Perjeta q3weeks to complete 1 year.    04/09/2018 Genetic Testing   Negative genetic testing on the [facility] Common Hereditary Cancers Panel. The Common Hereditary Cancers Panel offered by [facility] includes sequencing and/or deletion duplication testing of the following 47 genes: APC, ATM, AXIN2, BARD1, BMPR1A, BRCA1, BRCA2, BRIP1, CDH1, CDKN2A (  p14ARF), CDKN2A (p16INK4a), CKD4, CHEK2, CTNNA1, DICER1, EPCAM (Deletion/duplication testing only), GREM1 (promoter  region deletion/duplication testing only), KIT, MEN1, MLH1, MSH2, MSH3, MSH6, MUTYH, NBN, NF1, NHTL1, PALB2, PDGFRA, PMS2, POLD1, POLE, PTEN, RAD50, RAD51C, RAD51D, SDHB, SDHC, SDHD, SMAD4, SMARCA4. STK11, TP53, TSC1, TSC2, and VHL.  The following genes were evaluated for sequence changes only: SDHA and HOXB13 c.251G>A variant only.   Genetic testing did detect a Variant of Unknown Significance (VUS) in the POLD1 gene called c.985C>T (p.Pro329Ser). At this time, it is unknown if this variant is associated with increased cancer risk or if this is a normal finding, but most variants such as this get reclassified to being inconsequential. It should not be used to make medical management decisions.   The report date is 04/09/2018.   07/24/2018 Breast MRI   IMPRESSION: 1. Resolution of the enhancing mass (known cancer) and small satellite lesions previously seen in the right breast following chemotherapy.   2. The metastatic lymph node in the right axilla has decreased in size.   3.  No MRI evidence of left breast malignancy.   09/14/2018 Surgery   RIGHT NIPPLE SPARING MASTECTOMY WITH TARGETED DEEP RIGHT AXILLARY LYMPH NODE BIOPSY WITH RADIOACTIVE SEED LOCALIZATION AND RIGHT AXILLARY SENTINEL LYMPH NODE BIOPSY by Dr. Dalbert Batman  09/14/18    09/14/2018 Pathology Results   Diagnosis 1. Lymph node, sentinel, biopsy, right axillary with radioactive seed - ONE OF ONE LYMPH NODES NEGATIVE FOR CARCINOMA (0/1). - MILD TREATMENT EFFECT. - BIOPSY SITE. 2. Lymph node, sentinel, biopsy, right axillary - ONE OF ONE LYMPH NODES NEGATIVE FOR CARCINOMA (0/1). 3. Lymph node, sentinel, biopsy, right - ONE OF ONE LYMPH NODES NEGATIVE FOR CARCINOMA (0/1). 4. Lymph node, sentinel, biopsy, right - ONE OF ONE LYMPH NODES NEGATIVE FOR CARCINOMA (0/1). 5. Lymph node, sentinel, biopsy, right - ONE OF ONE LYMPH NODES NEGATIVE FOR CARCINOMA (0/1). 6. Lymph node, sentinel, biopsy, right - ONE OF ONE LYMPH NODES NEGATIVE  FOR CARCINOMA (0/1). 7. Lymph node, sentinel, biopsy, right - ONE OF ONE LYMPH NODES NEGATIVE FOR CARCINOMA (0/1). 8. Lymph node, sentinel, biopsy, right - ONE OF ONE LYMPH NODES NEGATIVE FOR CARCINOMA (0/1). 9. Lymph node, sentinel, biopsy, right - ONE OF ONE LYMPH NODES NEGATIVE FOR CARCINOMA (0/1). 10. Lymph node, sentinel, biopsy, right - ONE OF ONE LYMPH NODES NEGATIVE FOR CARCINOMA (0/1). 11. Lymph node, sentinel, biopsy, right - ONE OF ONE LYMPH NODES NEGATIVE FOR CARCINOMA (0/1). 12. Lymph node, sentinel, biopsy, right - ONE OF ONE LYMPH NODES NEGATIVE FOR CARCINOMA (0/1). 13. Nipple Biopsy, right - BENIGN NIPPLE TISSUE. 14. Breast, simple mastectomy, right - NO RESIDUAL CARCINOMA IDENTIFIED. - TREATMENT EFFECT. - FIBROCYSTIC CHANGE. - ONE OF ONE LYMPH NODES NEGATIVE FOR CARCINOMA (0/1). - SEE ONCOLOGY TABLE.   09/14/2018 Cancer Staging   Staging form: Breast, AJCC 8th Edition - Pathologic stage from 09/14/2018: No Stage Recommended (ypT0, pN0, cM0, GX, ER: Not Assessed, PR: Not Assessed, HER2: Not Assessed) - Signed by Truitt Merle, MD on 09/23/2018    11/03/2018 - 12/10/2018 Radiation Therapy   Adjuvant Radiation with Dr. Isidore Moos    12/27/2018 -  Anti-estrogen oral therapy   Start Tamoxifen 32m daily in 12/27/2018 stopped after 1 week due to poor toleration. She started anastrozole on 02/04/19 but stopped on 02/14/19 due to worsening muscle aches. She tried Letrozole but did not tolerate. She was switched to Exemestane in late 04/2019.    01/04/2019 Imaging   CT chest  IMPRESSION: 1. Stable tiny subpleural and  perifissural nodules bilaterally. No suspicious pulmonary nodule or mass. 2. No focal airspace disease in the right lung. 3.  Aortic Atherosclerois (ICD10-170.0)     03/08/2019 Imaging   CT AP W Contrast 03/08/19  IMPRESSION: 1. No metastatic disease in the abdomen pelvis. 2. No evidence adverse immunotherapy therapy reaction.   03/08/2019 Imaging   Whole body bone scan  03/08/19  IMPRESSION: No definite scintigraphic evidence of osseous metastases.   08/29/2019 - 08/29/2020 Chemotherapy   Started Nerlynx 229m daily with budesonide on 08/29/19. Reduced to 2028min late 08/2019. Completed on 08/29/20        INTERVAL HISTORY:  Claire Guzman here for a follow up of breast cancer. She was last seen by NP Lacie on 06/24/21. She presents to the clinic alone. She reports her joint pain has essentially resolved since coming off the exemestane. She notes occasional knee pain, but she feels this is degenerative/due to age.   All other systems were reviewed with the patient and are negative.  MEDICAL HISTORY:  Past Medical History:  Diagnosis Date   Abdominal pain, unspecified site    Anxiety state, unspecified    Complication of anesthesia    convulsions - when she has appendectomy about 12 years ago at [facility]   Contact dermatitis and other eczema, due to unspecified cause    Essential hypertension, benign    Family history of colon cancer    Family history of melanoma    Family history of prostate cancer    GERD (gastroesophageal reflux disease)    Hematuria, unspecified    Hypertension    Lower back pain    Mixed hyperlipidemia    Rosacea    rt breast ca dx'd 01/2018   R breast cancer   Unspecified symptom associated with female genital organs     SURGICAL HISTORY: Past Surgical History:  Procedure Laterality Date   BREAST RECONSTRUCTION WITH PLACEMENT OF TISSUE EXPANDER AND ALLODERM Right 09/14/2018   Procedure: BREAST RECONSTRUCTION WITH PLACEMENT OF TISSUE EXPANDER AND ALLODERM;  Surgeon: ThIrene LimboMD;  [place] [facility];  Laterality: Right;   CESAREAN SECTION     DIAGNOSTIC LAPAROSCOPY     EYE SURGERY     Lasik   LAPAROSCOPIC APPENDECTOMY     LIPOSUCTION WITH LIPOFILLING Right 07/26/2019   Procedure: LIPOFILLING from abdomen to R chest;  Surgeon: ThIrene LimboMD;  [place] [facility]  Service: [place]astics;  Laterality: Right;   MASTECTOMY WITH RADIOACTIVE SEED GUIDED EXCISION AND AXILLARY SENTINEL LYMPH NODE BIOPSY Right 09/14/2018   Procedure: RIGHT NIPPLE SPARING MASTECTOMY WITH TARGETED DEEP RIGHT AXILLARY LYMPH NODE BIOPSY WITH RADIOACTIVE SEED LOCALIZATION AND RIGHT AXILLARY SENTINEL LYMPH NODE BIOPSY, FROZEN SECTION AND POSSIBLE COMPLETION AXILLARY LYMPH NODE DISSECTION INJECT BLUE DYE RIGHT BREAST;  Surgeon: InFanny SkatesMD;  [place] [facility]: [place];  Laterality: Right;   PORT-A-CATH REMOVAL Left 07/26/2019   Procedure: REMOVAL PORT-A-CATH;  Surgeon: ThIrene LimboMD;  [place] [facility]: [facility];  Laterality: Left;   PORTACATH PLACEMENT Left 04/13/2018   Procedure: INSERTION PORT-A-CATH WITH [place] Surgeon: InFanny SkatesMD;  [facility] Service: General;  Laterality: Left;   REMOVAL OF TISSUE EXPANDER AND PLACEMENT OF IMPLANT Right 07/26/2019   Procedure: REMOVAL OF TISSUE EXPANDER AND PLACEMENT OF IMPLANT;  Surgeon: ThIrene LimboMD;  [place] [facility] [place];  Laterality: Right;   UTERINE FIBROID SURGERY  I have reviewed the social history and family history with the patient and they are unchanged from previous note.  ALLERGIES:  is allergic to metrogel [metronidazole], other, paxil [paroxetine hcl], sulfamethoxazole, zomig [zolmitriptan], and prednisone.  MEDICATIONS:  Current Outpatient Medications  Medication Sig Dispense Refill   ALPRAZolam (XANAX) 0.5 MG tablet Take 1 tablet (0.5 mg total) by mouth 2 (two) times daily as needed for anxiety. 30 tablet 0   atenolol (TENORMIN) 50 MG tablet Take 50 mg by mouth at bedtime.     Cholecalciferol (VITAMIN D3) 50 MCG (2000 UT) capsule Take 2,000 Units by mouth daily.     dexamethasone (DECADRON) 4 MG tablet Take 1 tablet (4 mg total) by mouth daily. 7 tablet 0   exemestane (AROMASIN) 25 MG tablet TAKE 1  TABLET(25 MG) BY MOUTH DAILY AFTER BREAKFAST 90 tablet 1   ibuprofen (ADVIL) 800 MG tablet Take 1 tablet (800 mg total) by mouth every 8 (eight) hours as needed. 20 tablet 0   lactulose (CHRONULAC) 10 GM/15ML solution TAKE 20 MLS BY MOUTH EVERY 4 HOURS AS NEEDED FOR CONSTIPATION 237 mL 0   meloxicam (MOBIC) 15 MG tablet TAKE 1 TABLET(15 MG) BY MOUTH DAILY WITH FOOD AS NEEDED FOR PAIN 20 tablet 0   potassium chloride SA (KLOR-CON) 20 MEQ tablet Take 1 tablet (20 mEq total) by mouth daily. 30 tablet 3   traMADol (ULTRAM) 50 MG tablet TAKE 1-2 TABLET BY MOUTH EVERY 12 HOURS AS NEEDED 20 tablet 0   valACYclovir (VALTREX) 500 MG tablet valacyclovir 500 mg tablet  TAKE 1 TABLET BY MOUTH TWICE DAILY     venlafaxine XR (EFFEXOR-XR) 150 MG 24 hr capsule Take 1 capsule (150 mg total) by mouth daily with breakfast. 30 capsule 5   Zinc 50 MG TABS Take 1 tablet by mouth daily.     No current facility-administered medications for this visit.    PHYSICAL EXAMINATION: ECOG PERFORMANCE STATUS: 0 - Asymptomatic  Vitals:   08/19/21 0936  BP: 127/67  Pulse: 61  Resp: 15  Temp: 98.6 F (37 C)  SpO2: 98%   Wt Readings from Last 3 Encounters:  08/19/21 164 lb 14.4 oz (74.8 kg)  05/14/21 168 lb (76.2 kg)  03/27/21 168 lb 6.4 oz (76.4 kg)     GENERAL:alert, no distress and comfortable SKIN: skin color, texture, turgor are normal, no rashes or significant lesions EYES: normal, Conjunctiva are pink and non-injected, sclera clear  NECK: supple, thyroid normal size, non-tender, without nodularity LYMPH:  no palpable lymphadenopathy in the cervical, axillary  LUNGS: clear to auscultation and percussion with normal breathing effort HEART: regular rate & rhythm and no murmurs and no lower extremity edema ABDOMEN:abdomen soft, non-tender and normal bowel sounds Musculoskeletal:no cyanosis of digits and no clubbing  NEURO: alert & oriented x 3 with fluent speech, no focal motor/sensory deficits BREAST: No  palpable mass, nodules or adenopathy bilaterally. Breast exam benign.   LABORATORY DATA:  I have reviewed the data as listed CBC Latest Ref Rng & Units 08/19/2021 03/18/2021 02/15/2021  WBC 4.0 - 10.5 K/uL 5.4 4.8 4.5  Hemoglobin 12.0 - 15.0 g/dL 13.2 13.6 13.3  Hematocrit 36.0 - 46.0 % 36.8 36.6 37.1  Platelets 150 - 400 K/uL 164 181 168     CMP Latest Ref Rng & Units 08/19/2021 03/18/2021 02/15/2021  Glucose 70 - 99 mg/dL 97 92 136(H)  BUN 6 - 20 mg/dL 18 12 12   Creatinine 0.44 - 1.00 mg/dL 0.58 0.70 0.53  Sodium  135 - 145 mmol/L 137 143 143  Potassium 3.5 - 5.1 mmol/L 3.0(L) 3.5 3.5  Chloride 98 - 111 mmol/L 102 106 103  CO2 22 - 32 mmol/L 29 26 28   Calcium 8.9 - 10.3 mg/dL 9.2 9.9 9.8  Total Protein 6.5 - 8.1 g/dL 7.9 7.6 7.5  Total Bilirubin 0.3 - 1.2 mg/dL 0.7 0.6 0.6  Alkaline Phos 38 - 126 U/L 85 84 68  AST 15 - 41 U/L 16 17 19   ALT 0 - 44 U/L 13 14 16       RADIOGRAPHIC STUDIES: I have personally reviewed the radiological images as listed and agreed with the findings in the report. No results found.    Orders Placed This Encounter  Procedures   DG Bone Density    Standing Status:   Future    Standing Expiration Date:   08/19/2022    Order Specific Question:   Reason for Exam (SYMPTOM  OR DIAGNOSIS REQUIRED)    Answer:   screening    Order Specific Question:   Is the patient pregnant?    Answer:   No    Order Specific Question:   Preferred imaging location?    Answer:   [facility]   All questions were answered. The patient knows to call the clinic with any problems, questions or concerns. No barriers to learning was detected. The total time spent in the appointment was 30 minutes.     Truitt Merle, MD 08/19/2021   I, Wilburn Mylar, am acting as scribe for Truitt Merle, MD.   I have reviewed the above documentation for accuracy and completeness, and I agree with the above.

## 2021-08-20 LAB — CANCER ANTIGEN 27.29: CA 27.29: 18.6 U/mL (ref 0.0–38.6)

## 2021-08-24 ENCOUNTER — Other Ambulatory Visit: Payer: Self-pay | Admitting: Hematology

## 2021-08-24 DIAGNOSIS — C50211 Malignant neoplasm of upper-inner quadrant of right female breast: Secondary | ICD-10-CM

## 2021-08-24 DIAGNOSIS — Z170 Estrogen receptor positive status [ER+]: Secondary | ICD-10-CM

## 2021-08-24 MED ORDER — POTASSIUM CHLORIDE CRYS ER 20 MEQ PO TBCR: 20.0000 meq | EXTENDED_RELEASE_TABLET | Freq: Two times a day (BID) | ORAL | 0 refills | Status: DC

## 2021-08-24 NOTE — Progress Notes (Signed)
I just noticed that her K was low on last visit 1/23, I will call in K to her pharmacy now and send her a Mychart message, please call her on Monday to confirm she is taking K, and repeat lab when she comes in for zometa next time. Thanks   Truitt Merle

## 2021-08-28 ENCOUNTER — Other Ambulatory Visit: Payer: Self-pay

## 2021-08-28 ENCOUNTER — Other Ambulatory Visit: Payer: Self-pay | Admitting: Hematology

## 2021-08-28 DIAGNOSIS — E2839 Other primary ovarian failure: Secondary | ICD-10-CM

## 2021-08-31 ENCOUNTER — Other Ambulatory Visit: Payer: Self-pay | Admitting: Hematology

## 2021-08-31 DIAGNOSIS — C50211 Malignant neoplasm of upper-inner quadrant of right female breast: Secondary | ICD-10-CM

## 2021-08-31 DIAGNOSIS — F419 Anxiety disorder, unspecified: Secondary | ICD-10-CM

## 2021-08-31 DIAGNOSIS — Z170 Estrogen receptor positive status [ER+]: Secondary | ICD-10-CM

## 2021-09-02 ENCOUNTER — Telehealth: Payer: Self-pay

## 2021-09-02 NOTE — Telephone Encounter (Signed)
Called patient regarding her low K and to see if she was taking oral K that Dr. Burr Medico called into her pharmacy. Patient verified that she was taking it, and I informed her that Dr. Burr Medico would like repeat labs on 09/19/21 when she comes in for her Zometa infusion. Patient verified understanding and had no questions or concerns. Staff message was sent to scheduling regarding lab appointment.

## 2021-09-14 ENCOUNTER — Encounter: Payer: Self-pay | Admitting: Hematology

## 2021-09-19 ENCOUNTER — Other Ambulatory Visit: Payer: Self-pay

## 2021-09-19 ENCOUNTER — Inpatient Hospital Stay: Payer: Medicaid Other | Attending: Nurse Practitioner

## 2021-09-19 ENCOUNTER — Ambulatory Visit: Payer: Medicaid Other

## 2021-09-19 ENCOUNTER — Inpatient Hospital Stay (HOSPITAL_BASED_OUTPATIENT_CLINIC_OR_DEPARTMENT_OTHER): Payer: Medicaid Other

## 2021-09-19 VITALS — BP 125/83 | HR 68 | Temp 98.5°F | Resp 16

## 2021-09-19 DIAGNOSIS — C50211 Malignant neoplasm of upper-inner quadrant of right female breast: Secondary | ICD-10-CM | POA: Diagnosis present

## 2021-09-19 DIAGNOSIS — M810 Age-related osteoporosis without current pathological fracture: Secondary | ICD-10-CM | POA: Diagnosis not present

## 2021-09-19 DIAGNOSIS — Z95828 Presence of other vascular implants and grafts: Secondary | ICD-10-CM

## 2021-09-19 LAB — CBC WITH DIFFERENTIAL/PLATELET
Abs Immature Granulocytes: 0.02 10*3/uL (ref 0.00–0.07)
Basophils Absolute: 0 10*3/uL (ref 0.0–0.1)
Basophils Relative: 1 %
Eosinophils Absolute: 0.1 10*3/uL (ref 0.0–0.5)
Eosinophils Relative: 3 %
HCT: 36.6 % (ref 36.0–46.0)
Hemoglobin: 12.9 g/dL (ref 12.0–15.0)
Immature Granulocytes: 1 %
Lymphocytes Relative: 34 %
Lymphs Abs: 1.4 10*3/uL (ref 0.7–4.0)
MCH: 31.5 pg (ref 26.0–34.0)
MCHC: 35.2 g/dL (ref 30.0–36.0)
MCV: 89.3 fL (ref 80.0–100.0)
Monocytes Absolute: 0.3 10*3/uL (ref 0.1–1.0)
Monocytes Relative: 7 %
Neutro Abs: 2.2 10*3/uL (ref 1.7–7.7)
Neutrophils Relative %: 54 %
Platelets: 188 10*3/uL (ref 150–400)
RBC: 4.1 MIL/uL (ref 3.87–5.11)
RDW: 12 % (ref 11.5–15.5)
WBC: 4.1 10*3/uL (ref 4.0–10.5)
nRBC: 0 % (ref 0.0–0.2)

## 2021-09-19 LAB — COMPREHENSIVE METABOLIC PANEL
ALT: 10 U/L (ref 0–44)
AST: 15 U/L (ref 15–41)
Albumin: 4.5 g/dL (ref 3.5–5.0)
Alkaline Phosphatase: 90 U/L (ref 38–126)
Anion gap: 8 (ref 5–15)
BUN: 11 mg/dL (ref 6–20)
CO2: 28 mmol/L (ref 22–32)
Calcium: 9.5 mg/dL (ref 8.9–10.3)
Chloride: 104 mmol/L (ref 98–111)
Creatinine, Ser: 0.58 mg/dL (ref 0.44–1.00)
GFR, Estimated: >60 mL/min (ref >60)
Glucose, Bld: 92 mg/dL (ref 70–99)
Potassium: 3.6 mmol/L (ref 3.5–5.1)
Sodium: 140 mmol/L (ref 135–145)
Total Bilirubin: 0.6 mg/dL (ref 0.3–1.2)
Total Protein: 7.5 g/dL (ref 6.5–8.1)

## 2021-09-19 LAB — MAGNESIUM: Magnesium: 1.9 mg/dL (ref 1.7–2.4)

## 2021-09-19 MED ORDER — ZOLEDRONIC ACID 4 MG/100ML IV SOLN
4.0000 mg | Freq: Once | INTRAVENOUS | Status: AC
  Administered 2021-09-19: 4 mg via INTRAVENOUS
  Filled 2021-09-19: qty 100

## 2021-09-19 MED ORDER — SODIUM CHLORIDE 0.9 % IV SOLN: Freq: Once | INTRAVENOUS | Status: AC

## 2021-09-19 NOTE — Patient Instructions (Signed)

## 2021-09-20 LAB — CANCER ANTIGEN 27.29: CA 27.29: 22 U/mL (ref 0.0–38.6)

## 2021-09-27 ENCOUNTER — Other Ambulatory Visit: Payer: Self-pay | Admitting: Hematology

## 2021-09-27 DIAGNOSIS — F419 Anxiety disorder, unspecified: Secondary | ICD-10-CM

## 2021-09-30 ENCOUNTER — Telehealth: Payer: Self-pay

## 2021-09-30 NOTE — Telephone Encounter (Signed)
Pt LVM regarding a fall at home and her DEXA Scan appt scheduled on 10/03/2021.  Pt asking if the DEXA scan will show if she's broke or sprang her wrist.  Pt want to know should she just follow-up with an Orthopedic Specialist if the DEXA will not show that.  Sent pt a MyChart message answering her question. ?

## 2021-10-03 ENCOUNTER — Other Ambulatory Visit: Payer: Self-pay

## 2021-10-03 ENCOUNTER — Ambulatory Visit
Admission: RE | Admit: 2021-10-03 | Discharge: 2021-10-03 | Disposition: A | Discharge status: Home / Self Care | Payer: Medicaid Other | Source: Ambulatory Visit | Attending: Hematology | Admitting: Hematology

## 2021-10-03 ENCOUNTER — Encounter: Payer: Self-pay | Admitting: Hematology

## 2021-10-03 DIAGNOSIS — E2839 Other primary ovarian failure: Secondary | ICD-10-CM

## 2021-10-17 ENCOUNTER — Telehealth: Payer: Self-pay

## 2021-10-17 NOTE — Telephone Encounter (Signed)
Pt called wanting her recent DEXA results.  Pt also wanted to let Dr. Burr Medico know she has stopped taking her Exemestane d/t side effects of medication.  Pt stated she may need to double up on her Effexor-XR d/t increased depression, fatigue, and feelings of pain.  Pt stated she has a bone spur in her left foot which is making walking difficult.  Pt stated "I feel like shit".  Pt stated she takes Vitamin B12 consistently but does not take her Calcium & Vitamin D on a regular basis.  Viewed pt's DEXA Results in Epic and pt has severe osteoporosis with high risk of fracture.  Encouraged pt to take vitamin D and Calcium supplements daily.  Pt is currently getting Zometa Q24wks.  Informed pt that this RN will notify Dr. Burr Medico of her request.  Sent Patient Call message to Dr. Burr Medico regarding my conversation with pt. ?

## 2021-10-30 ENCOUNTER — Ambulatory Visit: Payer: Medicaid Other | Admitting: Podiatry

## 2021-10-30 ENCOUNTER — Encounter: Payer: Self-pay | Admitting: Podiatry

## 2021-10-30 DIAGNOSIS — L600 Ingrowing nail: Secondary | ICD-10-CM

## 2021-10-30 DIAGNOSIS — M722 Plantar fascial fibromatosis: Secondary | ICD-10-CM

## 2021-10-30 MED ORDER — TRIAMCINOLONE ACETONIDE 10 MG/ML IJ SUSP
10.0000 mg | Freq: Once | INTRAMUSCULAR | Status: AC
  Administered 2021-10-30: 10 mg

## 2021-10-30 NOTE — Progress Notes (Signed)
Subjective:  ? ?Patient ID: Claire Guzman, female   DOB: 56 y.o.   MRN: [id number]  ? ?HPI ?Patient presents stating that the right hallux nail is loose but it is doing okay and the left heel has become sore again.  States that she traumatized her right hallux yesterday and its been very sore and also she gets chronic ingrown toenails of both her big toes ? ? ?ROS ? ? ?   ?Objective:  ?Physical Exam  ?Neurovascular status intact the right hallux nail is loose its been traumatized has had history of ingrown's on the lateral side as has the left 1 in quite a bit of discomfort in the plantar left heel ? ?   ?Assessment:  ?Traumatized right hallux nail with possibility of the loss of the nail with chronic ingrown's and Planter fasciitis left present ? ?   ?Plan:  ?H&P reviewed both conditions and for the nails I did discuss ingrown toenail correction educating her on surgery and risk and patient would like to do it but cannot do it today.  We will get a focus on the heel and I did do sterile prep injected the left plantar fascia 3 mg Kenalog 5 mg Xylocaine tolerated well applied sterile dressing reappoint and if the right hallux nail were to become partially detached will need to be removed ?   ? ? ?

## 2021-11-21 ENCOUNTER — Ambulatory Visit: Payer: Medicaid Other | Admitting: Podiatry

## 2021-11-21 DIAGNOSIS — G5792 Unspecified mononeuropathy of left lower limb: Secondary | ICD-10-CM

## 2021-11-21 MED ORDER — TRAMADOL HCL 50 MG PO TABS
ORAL_TABLET | ORAL | 0 refills | Status: DC
Start: 2021-11-21 — End: 2021-12-17

## 2021-11-21 MED ORDER — MELOXICAM 15 MG PO TABS: ORAL_TABLET | ORAL | 0 refills | Status: DC

## 2021-11-21 NOTE — Progress Notes (Signed)
?  Subjective:  ?Patient ID: Claire Guzman, female    DOB: June 12, 1966,   MRN: [id number] ? ?Chief Complaint  ?Patient presents with  ? Foot Pain  ?   ?Left heel pain  ? ? ?56 y.o. female presents for concern of left heel pain that has been going on for a while. Relates a history of heel spurs. She recently had an injection with Dr. Paulla Dolly that relates did not helps. She is not diabetic. Pain is not constant  . Denies any other pedal complaints. Denies n/v/f/c.  ? ?Past Medical History:  ?Diagnosis Date  ? Abdominal pain, unspecified site   ? Anxiety state, unspecified   ? Complication of anesthesia   ? convulsions - when she has appendectomy about 12 years ago at [facility]  ? Contact dermatitis and other eczema, due to unspecified cause   ? Essential hypertension, benign   ? Family history of colon cancer   ? Family history of melanoma   ? Family history of prostate cancer   ? GERD (gastroesophageal reflux disease)   ? Hematuria, unspecified   ? Hypertension   ? Lower back pain   ? Mixed hyperlipidemia   ? Rosacea   ? rt breast ca dx'd 01/2018  ? R breast cancer  ? Unspecified symptom associated with female genital organs   ? ? ?Objective:  ?Physical Exam: ?Vascular: DP/PT pulses 2/4 bilateral. CFT <3 seconds. Normal hair growth on digits. No edema.  ?Skin. No lacerations or abrasions bilateral feet.  ?Musculoskeletal: MMT 5/5 bilateral lower extremities in DF, PF, Inversion and Eversion. Deceased ROM in DF of ankle joint. Mildly tender to the lateral aspect of the plantar heel. No pain along medial calcaneal tubercle. No pain to the achilles tendon or along the arch. Negative tinel's sign.  ?Neurological: Sensation intact to light touch.  ? ?Assessment:  ? ?1. Neuritis of foot, left   ? ? ? ?Plan:  ?Patient was evaluated and treated and all questions answered. ?Discussed neuritis vs radiculopathy and etiology as well as treatment with patient.  ?Radiographs reviewed and discussed with patient. Mild spurring  noted to posterior calcaneus.  ?-Discussed supportive shoes at all times and checking feet regularly.  ?-Follow-up with PCP / rheumatology  ?- Prescription for meloxicam provided.  ?-Refilled tramadol as needed.  ?-Patient to return in as needed ? ? ?Lorenda [name], DPM  ? ? ?

## 2021-12-12 ENCOUNTER — Ambulatory Visit: Payer: Medicaid Other | Admitting: Podiatry

## 2021-12-12 ENCOUNTER — Ambulatory Visit (INDEPENDENT_AMBULATORY_CARE_PROVIDER_SITE_OTHER): Payer: Medicaid Other

## 2021-12-12 DIAGNOSIS — M7732 Calcaneal spur, left foot: Secondary | ICD-10-CM

## 2021-12-12 DIAGNOSIS — M722 Plantar fascial fibromatosis: Secondary | ICD-10-CM | POA: Diagnosis not present

## 2021-12-12 MED ORDER — TRIAMCINOLONE ACETONIDE 10 MG/ML IJ SUSP
10.0000 mg | Freq: Once | INTRAMUSCULAR | Status: AC
  Administered 2021-12-12: 10 mg

## 2021-12-12 NOTE — Patient Instructions (Signed)

## 2021-12-12 NOTE — Progress Notes (Unsigned)
Subjective: 56 year old female presents the office today for concerns of left heel pain.  She previously had an injection on October 30, 2021 and she states this was not helpful.  She did followed up with Dr. Blenda Mounts and was prescribed meloxicam which has not been helpful.  Patient wants to have further evaluation and discuss another steroid injection.  She did have an ongoing heel pain for some time and has been getting worse.  She does have an upcoming appointment to see rheumatology for rheumatoid arthritis.  She also is can get her back evaluated to see if this could be a contributor to her foot pain.   Objective: AAO x3, NAD DP/PT pulses palpable bilaterally, CRT less than 3 seconds There is tenderness palpation of the plantar central as well as the plantar lateral aspect of the left heel along the insertion of the plantar fascia and along the abductor muscle.  There is no pain with lateral compression of calcaneus.  She does get some tenderness on the flexor tendons on the ankle as well as the Achilles tendon.  Clinically the tendons appear to be intact.  Negative Tinel sign.  No open lesions or pre-ulcerative lesions.  No pain with calf compression, swelling, warmth, erythema  Assessment: 56 year old female with ongoing chronic heel pain, Planter fasciitis  Plan: -All treatment options discussed with the patient including all alternatives, risks, complications.  -Repeat x-rays were obtained reviewed.  3 views of the left foot were obtained.  Calcaneal spurring is present.  No evidence of acute fracture. -We discussed another steroid injection.  Skin was cleaned with alcohol and the patient's pain was identified.  The mixture of 1 cc Kenalog 10, 0.5 cc of Marcaine plain, 0.5 cc of lidocaine plain was infiltrated to the lateral aspect the heel without any complications on the area of tenderness.  Postinjection care discussed. -Originally prescribed Celebrex however given sulfa allergy prescribed  Voltaren.  This was not covered by insurance so I sent naproxen. -Encourage stretching, icing.  Recommend physical therapy referral was placed. -Also given ongoing nature of her symptoms despite conservative care I ordered MRI. -Patient encouraged to call the office with any questions, concerns, change in symptoms.   Trula Slade DPM

## 2021-12-13 ENCOUNTER — Telehealth: Payer: Self-pay | Admitting: *Deleted

## 2021-12-13 ENCOUNTER — Telehealth: Payer: Self-pay | Admitting: Podiatry

## 2021-12-13 MED ORDER — NAPROXEN 500 MG PO TABS: 500.0000 mg | ORAL_TABLET | Freq: Two times a day (BID) | ORAL | 0 refills | Status: DC

## 2021-12-13 MED ORDER — DICLOFENAC SODIUM 75 MG PO TBEC
75.0000 mg | DELAYED_RELEASE_TABLET | Freq: Two times a day (BID) | ORAL | 0 refills | Status: DC
Start: 2021-12-13 — End: 2021-12-13

## 2021-12-13 NOTE — Telephone Encounter (Signed)
Pt was seen yesterday. States he foot isnt feeling any better even after the injection. She states a medication was to be sent in but not record of that as of yet. Also she mentioned a MRI order was going to be placed. I dont see it in order but I could have overlooked it.  [id number] - [place], [place] - [address]  Please advise

## 2021-12-13 NOTE — Telephone Encounter (Signed)
Patient called again about her medication not being called in .

## 2021-12-13 NOTE — Telephone Encounter (Signed)
Spoke with patient to give information per Dr Jacqualyn Posey, verbalized understanding, will pick up medication but will get a Toradol injection from PCP today to help with the pain as well.

## 2021-12-17 ENCOUNTER — Telehealth: Payer: Self-pay | Admitting: *Deleted

## 2021-12-17 ENCOUNTER — Other Ambulatory Visit: Payer: Self-pay | Admitting: Podiatry

## 2021-12-17 ENCOUNTER — Ambulatory Visit
Admission: RE | Admit: 2021-12-17 | Discharge: 2021-12-17 | Disposition: A | Discharge status: Home / Self Care | Payer: Medicaid Other | Source: Ambulatory Visit | Attending: Podiatry | Admitting: Podiatry

## 2021-12-17 DIAGNOSIS — M722 Plantar fascial fibromatosis: Secondary | ICD-10-CM

## 2021-12-17 MED ORDER — TRAMADOL HCL 50 MG PO TABS: ORAL_TABLET | ORAL | 0 refills | Status: DC

## 2021-12-17 NOTE — Telephone Encounter (Signed)
Patient notified thru voice message

## 2021-12-17 NOTE — Telephone Encounter (Signed)
Patient is calling for a refill of the tramadol-'50mg'$ ,almost out and this is the only medicine that helps, injection and other medicine did not help and it hurts to walk. She is scheduled for MRI today. Please advise.

## 2021-12-18 NOTE — Telephone Encounter (Signed)
Patient is calling because her foot seems to be getting worse, should she reframe from walking dog and other activities? She has her MRI today as well. Please advise.

## 2021-12-24 ENCOUNTER — Ambulatory Visit: Payer: Medicaid Other | Attending: Podiatry

## 2021-12-24 DIAGNOSIS — M25675 Stiffness of left foot, not elsewhere classified: Secondary | ICD-10-CM | POA: Diagnosis present

## 2021-12-24 DIAGNOSIS — M722 Plantar fascial fibromatosis: Secondary | ICD-10-CM | POA: Insufficient documentation

## 2021-12-24 DIAGNOSIS — M79672 Pain in left foot: Secondary | ICD-10-CM | POA: Insufficient documentation

## 2021-12-24 NOTE — Therapy (Signed)
[facility] [place], [place], [address] Phone: [phone]   Fax:  [phone]  Physical Therapy Evaluation  Patient Details  Name: Claire Guzman MRN: [id number] Date of Birth: 12-Feb-1966 Referring Provider (PT): [place], [place]   Encounter Date: 12/24/2021   PT End of Session - 12/24/21 0914     Visit Number 1    Number of Visits 10    Date for PT Re-Evaluation 02/21/22    PT Start Time [phone]   Patient arrived late to her appointment   PT Stop Time 0948    PT Time Calculation (min) 32 min    Activity Tolerance Patient tolerated treatment well    Behavior During Therapy [facility] for tasks assessed/performed             Past Medical History:  Diagnosis Date   Abdominal pain, unspecified site    Anxiety state, unspecified    Complication of anesthesia    convulsions - when she has appendectomy about 12 years ago [facility]   Contact dermatitis and other eczema, due to unspecified cause    Essential hypertension, benign    Family history of colon cancer    Family history of melanoma    Family history of prostate cancer    GERD (gastroesophageal reflux disease)    Hematuria, unspecified    Hypertension    Lower back pain    Mixed hyperlipidemia    Rosacea    rt breast ca dx'd 01/2018   R breast cancer   Unspecified symptom associated with female genital organs     Past Surgical History:  Procedure Laterality Date   BREAST RECONSTRUCTION WITH PLACEMENT OF TISSUE EXPANDER AND ALLODERM Right 09/14/2018   Procedure: BREAST RECONSTRUCTION WITH PLACEMENT OF TISSUE EXPANDER AND ALLODERM;  Surgeon: Irene Limbo, MD;  Location: [name];  Service: Plastics;  Laterality: Right;   CESAREAN SECTION     DIAGNOSTIC LAPAROSCOPY     EYE SURGERY     Lasik   LAPAROSCOPIC APPENDECTOMY     LIPOSUCTION WITH LIPOFILLING Right 07/26/2019   Procedure: LIPOFILLING from abdomen to R chest;  Surgeon: Irene Limbo, MD;   Location: [place];  Service: Plastics;  Laterality: Right;   MASTECTOMY WITH RADIOACTIVE SEED GUIDED EXCISION AND AXILLARY SENTINEL LYMPH NODE BIOPSY Right 09/14/2018   Procedure: RIGHT NIPPLE SPARING MASTECTOMY WITH TARGETED DEEP RIGHT AXILLARY LYMPH NODE BIOPSY WITH RADIOACTIVE SEED LOCALIZATION AND RIGHT AXILLARY SENTINEL LYMPH NODE BIOPSY, FROZEN SECTION AND POSSIBLE COMPLETION AXILLARY LYMPH NODE DISSECTION INJECT BLUE DYE RIGHT BREAST;  Surgeon: Fanny Skates, MD;  Location: [facility];  Service: General;  Laterality: Right;   PORT-A-CATH REMOVAL Left 07/26/2019   Procedure: REMOVAL PORT-A-CATH;  Surgeon: Irene Limbo, MD;  Location: [place];  Service: Plastics;  Laterality: Left;   PORTACATH PLACEMENT Left 04/13/2018   Procedure: INSERTION PORT-A-CATH WITH [place];  Surgeon: Fanny Skates, MD;  Location: [place];  Service: General;  Laterality: Left;   REMOVAL OF TISSUE EXPANDER AND PLACEMENT OF IMPLANT Right 07/26/2019   Procedure: REMOVAL OF TISSUE EXPANDER AND PLACEMENT OF IMPLANT;  Surgeon: Irene Limbo, MD;  Location: [place];  Service: Plastics;  Laterality: Right;   UTERINE FIBROID SURGERY      There were no vitals filed for this visit.    Subjective Assessment - 12/24/21 0914     Subjective Patient reports that she has been having foot pain for quite some time following breast cancer.  She notes that it had gotten a low worse the past few months where nothing helps her pain. She notes that it hurts more when she standing or walking. She notes that having the heel of her shoe touching her heel can aggravate her pain. She had tried injections before, but this did not help any. She notes that she has never had anything like this happen before.    Pertinent History OA, osteoporosis, history of breast cancer, chronic back pain    Limitations Walking;Standing;House hold activities    How long can you stand  comfortably? pain as soon as she stands and walks    How long can you walk comfortably? pain as soon as she stands and walks    Patient Stated Goals walk without pain    Currently in Pain? Yes    Pain Score 9     Pain Location Ankle    Pain Orientation Left    Pain Descriptors / Indicators Dull;Stabbing    Pain Type Chronic pain    Pain Radiating Towards none    Pain Onset More than a month ago    Pain Frequency Constant    Aggravating Factors  walking, standing,    Pain Relieving Factors getting off her feet    Effect of Pain on Daily Activities she has to modify how she does her daily activities                [facility] PT Assessment - 12/24/21 0001       Assessment   Medical Diagnosis Plantar fascitis    Referring Provider (PT) Jacqualyn Posey, DPM    Onset Date/Surgical Date --   over 5 months ago   Next MD Visit None    Prior Therapy No      Precautions   Precautions None      Restrictions   Weight Bearing Restrictions No      Balance Screen   Has the patient fallen in the past 6 months No   increase in usteadiness due to pain   Has the patient had a decrease in activity level because of a fear of falling?  Yes    Is the patient reluctant to leave their home because of a fear of falling?  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, Press photographer    Leisure housework      Cognition   Overall Cognitive Status Within Functional Limits for tasks assessed    Attention Focused    Focused Attention Appears intact    Memory Appears intact    Awareness Appears intact    Problem Solving Appears intact      Observation/Other Assessments   Other Surveys  Lower Extremity Functional Scale    Lower Extremity Functional Scale  47.5%    38/80     Observation/Other Assessments-Edema    Edema Figure 8;Circumferential      Circumferential Edema   Circumferential - Right 24.5 cm   measured at malleoli   Circumferential - Left  25.0 cm   measured at malleoli     Figure 8 Edema   Figure 8 - Right  54 cm    Figure  8 - Left  54 cm      Sensation   Additional Comments Patient reports no numbness or tingling      ROM / Strength   AROM / PROM / Strength AROM;Strength      AROM   AROM Assessment Site Ankle    Right/Left Ankle Right;Left    Right Ankle Dorsiflexion 8    Right Ankle Plantar Flexion 35    Right Ankle Inversion 25    Right Ankle Eversion 8    Left Ankle Dorsiflexion 1   pulling   Left Ankle Plantar Flexion 42    Left Ankle Inversion 22    Left Ankle Eversion 6      Strength   Strength Assessment Site Ankle    Right/Left Ankle Right;Left    Right Ankle Dorsiflexion 4+/5    Right Ankle Inversion 5/5    Right Ankle Eversion 4+/5    Left Ankle Dorsiflexion 4+/5    Left Ankle Inversion 4+/5    Left Ankle Eversion 4+/5      Palpation   Palpation comment No significant tenderness to palpation   Talocrural joint mobility: WFL and painful     Ambulation/Gait   Ambulation/Gait Yes    Ambulation/Gait Assistance 7: Independent    Assistive device None    Gait Pattern Step-through pattern;Decreased stride length    Ambulation Surface Level;Indoor    Gait velocity decreased                        Objective measurements completed on examination: See above findings.                     PT Long Term Goals - 12/24/21 1834       PT LONG TERM GOAL #1   Title Patient will be independent with her HEP.    Time 5    Period Weeks    Status New    Target Date 01/28/22      PT LONG TERM GOAL #2   Title Patient will be able to walk at least 30 minutes without her familiar pain exceeding a 6/10.    Time 5    Period Weeks    Status New    Target Date 01/28/22      PT LONG  TERM GOAL #3   Title Patient will be able to demonstrate at least 5 degrees of left ankle dorsiflexion for improved gait mechanics with walking.    Time 5    Period Weeks    Status New    Target Date 01/28/22      PT LONG TERM GOAL #4   Title Patient will improve her LEFS score to at least 60% for improved function with her daily activities.    Time 5    Period Weeks    Status New    Target Date 01/28/22                    Plan - 12/24/21 0950     Clinical Impression Statement Patient is a 56 year old female presenting to physical therapy with chronic left foot pain. She presented with moderate to high pain severity and low pain irritability as none of today's assessments significantly reproduced her familiar symptoms. She did exhibit reduced left ankle AROM compared to the right. Recommend that she continue with skilled physical therapy to address her remaining impairments to return to her prior level of function.  Personal Factors and Comorbidities Time since onset of injury/illness/exacerbation;Comorbidity 3+    Comorbidities OA, osteoporosis, and history of breast cancer    Examination-Activity Limitations Locomotion Level;Stand;Stairs    Examination-Participation Restrictions Cleaning;Laundry    Stability/Clinical Decision Making Evolving/Moderate complexity    Clinical Decision Making Moderate    Rehab Potential Good    PT Frequency 2x / week    PT Duration Other (comment)   5 weeks   PT Treatment/Interventions Neuromuscular re-education;Therapeutic exercise;Therapeutic activities;Manual techniques;Passive range of motion;Patient/family education    PT Next Visit Plan nustep, gastroc/soleus stretch, ankle strengthening    Consulted and Agree with Plan of Care Patient             Patient will benefit from skilled therapeutic intervention in order to improve the following deficits and impairments:  Difficulty walking, Decreased range of motion, Pain, Decreased  activity tolerance, Increased edema, Decreased strength, Decreased mobility  Visit Diagnosis: Pain in left foot  Stiffness of left foot, not elsewhere classified     Problem List Patient Active Problem List   Diagnosis Date Noted   Plantar fasciitis 12/12/2021   Heel spur, left 12/12/2021   Headache, acute 08/08/2021   Low back pain 05/14/2021   Acquired absence of right breast 09/22/2018   Port-A-Cath in place 05/11/2018   Genetic testing 04/12/2018   Family history of prostate cancer    Family history of colon cancer    Family history of melanoma    Malignant neoplasm of upper-inner quadrant of right breast in female, estrogen receptor positive ([place]) 03/10/2018    Darlin Coco, PT 12/24/2021, 6:38 PM  [facility] [address] [place], [place], [address] Phone: [phone]   Fax:  [phone]  Name: Claire Guzman MRN: [id number] Date of Birth: [birth year]

## 2022-01-01 ENCOUNTER — Ambulatory Visit: Payer: Medicaid Other | Attending: Podiatry | Admitting: Physical Therapy

## 2022-01-01 DIAGNOSIS — M25675 Stiffness of left foot, not elsewhere classified: Secondary | ICD-10-CM | POA: Diagnosis present

## 2022-01-01 DIAGNOSIS — M79672 Pain in left foot: Secondary | ICD-10-CM

## 2022-01-01 NOTE — Therapy (Addendum)
[facility] [place], [place], [address] Phone: [phone]   Fax:  [phone]  Physical Therapy Treatment  Patient Details  Name: Claire Guzman MRN: [id number] Date of Birth: 1965-12-16 Referring Provider (PT): [place], [place]   Encounter Date: 01/01/2022   PT End of Session - 01/01/22 1201     Visit Number 2    Number of Visits 10    Date for PT Re-Evaluation 02/21/22    PT Start Time 1120    PT Stop Time 6195    PT Time Calculation (min) 41 min    Activity Tolerance Patient tolerated treatment well    Behavior During Therapy [facility] for tasks assessed/performed             Past Medical History:  Diagnosis Date   Abdominal pain, unspecified site    Anxiety state, unspecified    Complication of anesthesia    convulsions - when she has appendectomy about 12 years ago [facility]   Contact dermatitis and other eczema, due to unspecified cause    Essential hypertension, benign    Family history of colon cancer    Family history of melanoma    Family history of prostate cancer    GERD (gastroesophageal reflux disease)    Hematuria, unspecified    Hypertension    Lower back pain    Mixed hyperlipidemia    Rosacea    rt breast ca dx'd 01/2018   R breast cancer   Unspecified symptom associated with female genital organs     Past Surgical History:  Procedure Laterality Date   BREAST RECONSTRUCTION WITH PLACEMENT OF TISSUE EXPANDER AND ALLODERM Right 09/14/2018   Procedure: BREAST RECONSTRUCTION WITH PLACEMENT OF TISSUE EXPANDER AND ALLODERM;  Surgeon: Irene Limbo, MD;  Location: [facility];  Service: Plastics;  Laterality: Right;   CESAREAN SECTION     DIAGNOSTIC LAPAROSCOPY     EYE SURGERY     Lasik   LAPAROSCOPIC APPENDECTOMY     LIPOSUCTION WITH LIPOFILLING Right 07/26/2019   Procedure: LIPOFILLING from abdomen to R chest;  Surgeon: Irene Limbo, MD;  Location: [facility];   Service: Plastics;  Laterality: Right;   MASTECTOMY WITH RADIOACTIVE SEED GUIDED EXCISION AND AXILLARY SENTINEL LYMPH NODE BIOPSY Right 09/14/2018   Procedure: RIGHT NIPPLE SPARING MASTECTOMY WITH TARGETED DEEP RIGHT AXILLARY LYMPH NODE BIOPSY WITH RADIOACTIVE SEED LOCALIZATION AND RIGHT AXILLARY SENTINEL LYMPH NODE BIOPSY, FROZEN SECTION AND POSSIBLE COMPLETION AXILLARY LYMPH NODE DISSECTION INJECT BLUE DYE RIGHT BREAST;  Surgeon: Fanny Skates, MD;  Location: [place];  Service: General;  Laterality: Right;   PORT-A-CATH REMOVAL Left 07/26/2019   Procedure: REMOVAL PORT-A-CATH;  Surgeon: Irene Limbo, MD;  Location: [facility];  Service: Plastics;  Laterality: Left;   PORTACATH PLACEMENT Left 04/13/2018   Procedure: INSERTION PORT-A-CATH WITH [place];  Surgeon: Fanny Skates, MD;  Location: [facility];  Service: General;  Laterality: Left;   REMOVAL OF TISSUE EXPANDER AND PLACEMENT OF IMPLANT Right 07/26/2019   Procedure: REMOVAL OF TISSUE EXPANDER AND PLACEMENT OF IMPLANT;  Surgeon: Irene Limbo, MD;  Location: [facility];  Service: Plastics;  Laterality: Right;   UTERINE FIBROID SURGERY      There were no vitals filed for this visit.       [facility] PT Assessment - 01/01/22 0001       Assessment   Medical Diagnosis Plantar fascitis    Referring Provider (PT) Jacqualyn Posey, DPM  Next MD Visit None    Prior Therapy No      Precautions   Precautions None      Restrictions   Weight Bearing Restrictions No                           [facility] Adult PT Treatment/Exercise - 01/01/22 0001       Exercises   Exercises Ankle      Manual Therapy   Manual Therapy Myofascial release;Passive ROM    Myofascial Release IASTW to L PF, medial ankle, distal achilles    Passive ROM Passive gastroc, soleus, PF stretches 3x30 sec eah      Ankle Exercises: Aerobic   Recumbent Bike L2 x11 min      Ankle Exercises: Standing   Rocker Board  3 minutes      Ankle Exercises: Seated   Other Seated Ankle Exercises L foot prostretch x3 min    Other Seated Ankle Exercises L foot ball rolls                          PT Long Term Goals - 12/24/21 1834       PT LONG TERM GOAL #1   Title Patient will be independent with her HEP.    Time 5    Period Weeks    Status New    Target Date 01/28/22      PT LONG TERM GOAL #2   Title Patient will be able to walk at least 30 minutes without her familiar pain exceeding a 6/10.    Time 5    Period Weeks    Status New    Target Date 01/28/22      PT LONG TERM GOAL #3   Title Patient will be able to demonstrate at least 5 degrees of left ankle dorsiflexion for improved gait mechanics with walking.    Time 5    Period Weeks    Status New    Target Date 01/28/22      PT LONG TERM GOAL #4   Title Patient will improve her LEFS score to at least 60% for improved function with her daily activities.    Time 5    Period Weeks    Status New    Target Date 01/28/22                   Plan - 01/01/22 1212     Clinical Impression Statement Patient presented in clinic with reports of continued L heel pain. Patient instructed in use of tennis ball for self massage as well as ice bottle massage to L plantar surface as well. Patient reported some discomfort with standing rockerboard stretching. No palpable tenderness to L PF but still sensitive with contact to posterior L heel. More tightness notes with soleus and gastroc stretching passively.    Personal Factors and Comorbidities Time since onset of injury/illness/exacerbation;Comorbidity 3+    Comorbidities OA, osteoporosis, and history of breast cancer    Examination-Activity Limitations Locomotion Level;Stand;Stairs    Examination-Participation Restrictions Cleaning;Laundry    Stability/Clinical Decision Making Evolving/Moderate complexity    Rehab Potential Good    PT Frequency 2x / week    PT Duration Other  (comment)    PT Treatment/Interventions Neuromuscular re-education;Therapeutic exercise;Therapeutic activities;Manual techniques;Passive range of motion;Patient/family education    PT Next Visit Plan nustep, gastroc/soleus stretch, ankle strengthening    Consulted and Agree with  Plan of Care Patient             Patient will benefit from skilled therapeutic intervention in order to improve the following deficits and impairments:  Difficulty walking, Decreased range of motion, Pain, Decreased activity tolerance, Increased edema, Decreased strength, Decreased mobility  Visit Diagnosis: Pain in left foot  Stiffness of left foot, not elsewhere classified     Problem List Patient Active Problem List   Diagnosis Date Noted   Plantar fasciitis 12/12/2021   Heel spur, left 12/12/2021   Headache, acute 08/08/2021   Low back pain 05/14/2021   Acquired absence of right breast 09/22/2018   Port-A-Cath in place 05/11/2018   Genetic testing 04/12/2018   Family history of prostate cancer    Family history of colon cancer    Family history of melanoma    Malignant neoplasm of upper-inner quadrant of right breast in female, estrogen receptor positive ([place]) 03/10/2018   Rationale for Evaluation and Treatment Rehabilitation   Standley Brooking, PTA 01/01/2022, 1:29 PM  [facility] [address] [place], [place], [address] Phone: [phone]   Fax:  [phone]  Name: Odyssey Vasbinder MRN: [id number] Date of Birth: October 24, 1965

## 2022-01-06 ENCOUNTER — Encounter: Payer: Self-pay | Admitting: Physical Therapy

## 2022-01-06 ENCOUNTER — Ambulatory Visit: Payer: Medicaid Other | Admitting: Physical Therapy

## 2022-01-06 DIAGNOSIS — M79672 Pain in left foot: Secondary | ICD-10-CM | POA: Diagnosis not present

## 2022-01-06 DIAGNOSIS — M25675 Stiffness of left foot, not elsewhere classified: Secondary | ICD-10-CM

## 2022-01-06 NOTE — Therapy (Signed)
[facility] [place], [place], [address] Phone: [phone]   Fax:  [phone]  Physical Therapy Treatment  Patient Details  Name: Claire Guzman MRN: [id number] Date of Birth: Sep 28, 1965 Referring Provider (PT): [place], [place]   Encounter Date: 01/06/2022   PT End of Session - 01/06/22 1010     Visit Number 3    Number of Visits 10    Date for PT Re-Evaluation 02/21/22    PT Start Time 0902    PT Stop Time 0943    PT Time Calculation (min) 41 min    Activity Tolerance Patient tolerated treatment well    Behavior During Therapy [facility] for tasks assessed/performed             Past Medical History:  Diagnosis Date   Abdominal pain, unspecified site    Anxiety state, unspecified    Complication of anesthesia    convulsions - when she has appendectomy about 12 years ago [facility]   Contact dermatitis and other eczema, due to unspecified cause    Essential hypertension, benign    Family history of colon cancer    Family history of melanoma    Family history of prostate cancer    GERD (gastroesophageal reflux disease)    Hematuria, unspecified    Hypertension    Lower back pain    Mixed hyperlipidemia    Rosacea    rt breast ca dx'd 01/2018   R breast cancer   Unspecified symptom associated with female genital organs     Past Surgical History:  Procedure Laterality Date   BREAST RECONSTRUCTION WITH PLACEMENT OF TISSUE EXPANDER AND ALLODERM Right 09/14/2018   Procedure: BREAST RECONSTRUCTION WITH PLACEMENT OF TISSUE EXPANDER AND ALLODERM;  Surgeon: Irene Limbo, MD;  Location: [place];  Service: Plastics;  Laterality: Right;   CESAREAN SECTION     DIAGNOSTIC LAPAROSCOPY     EYE SURGERY     Lasik   LAPAROSCOPIC APPENDECTOMY     LIPOSUCTION WITH LIPOFILLING Right 07/26/2019   Procedure: LIPOFILLING from abdomen to R chest;  Surgeon: Irene Limbo, MD;  Location: [place];   Service: Plastics;  Laterality: Right;   MASTECTOMY WITH RADIOACTIVE SEED GUIDED EXCISION AND AXILLARY SENTINEL LYMPH NODE BIOPSY Right 09/14/2018   Procedure: RIGHT NIPPLE SPARING MASTECTOMY WITH TARGETED DEEP RIGHT AXILLARY LYMPH NODE BIOPSY WITH RADIOACTIVE SEED LOCALIZATION AND RIGHT AXILLARY SENTINEL LYMPH NODE BIOPSY, FROZEN SECTION AND POSSIBLE COMPLETION AXILLARY LYMPH NODE DISSECTION INJECT BLUE DYE RIGHT BREAST;  Surgeon: Fanny Skates, MD;  Location: [place];  Service: General;  Laterality: Right;   PORT-A-CATH REMOVAL Left 07/26/2019   Procedure: REMOVAL PORT-A-CATH;  Surgeon: Irene Limbo, MD;  Location: [place];  Service: Plastics;  Laterality: Left;   PORTACATH PLACEMENT Left 04/13/2018   Procedure: INSERTION PORT-A-CATH WITH [place];  Surgeon: Fanny Skates, MD;  Location: [place];  Service: General;  Laterality: Left;   REMOVAL OF TISSUE EXPANDER AND PLACEMENT OF IMPLANT Right 07/26/2019   Procedure: REMOVAL OF TISSUE EXPANDER AND PLACEMENT OF IMPLANT;  Surgeon: Irene Limbo, MD;  Location: [place];  Service: Plastics;  Laterality: Right;   UTERINE FIBROID SURGERY      There were no vitals filed for this visit.   Subjective Assessment - 01/06/22 0905     Subjective Had a lot of parties to go to this weekend so she didn't get to self massage like instructed. Had more pain this weekend.  Pertinent History OA, osteoporosis, history of breast cancer, chronic back pain    Limitations Walking;Standing;House hold activities    How long can you stand comfortably? pain as soon as she stands and walks    How long can you walk comfortably? pain as soon as she stands and walks    Patient Stated Goals walk without pain    Currently in Pain? Yes    Pain Score 6     Pain Location Ankle    Pain Orientation Left    Pain Descriptors / Indicators Discomfort;Sore    Pain Type Chronic pain    Pain Onset More than a month ago     Pain Frequency Constant                [facility] PT Assessment - 01/06/22 0001       Assessment   Medical Diagnosis Plantar fascitis    Referring Provider (PT) Jacqualyn Posey, DPM    Next MD Visit None    Prior Therapy No      Precautions   Precautions None                           [facility] Adult PT Treatment/Exercise - 01/06/22 0001       Ankle Exercises: Standing   Rocker Board 5 minutes    Heel Raises 20 reps;Limitations   eccentric off 2" step   Toe Raise 20 reps;Limitations   eccentri off 2" step   Other Standing Ankle Exercises L lunges x20 reps for stretch      Ankle Exercises: Aerobic   Recumbent Bike L3 x14 min      Ankle Exercises: Seated   Ankle Circles/Pumps 20 reps;10 reps;AROM    Towel Crunch Limitations   x3 min   Other Seated Ankle Exercises L foot prostretch x3 min    Other Seated Ankle Exercises L foot ball rolls                          PT Long Term Goals - 12/24/21 1834       PT LONG TERM GOAL #1   Title Patient will be independent with her HEP.    Time 5    Period Weeks    Status New    Target Date 01/28/22      PT LONG TERM GOAL #2   Title Patient will be able to walk at least 30 minutes without her familiar pain exceeding a 6/10.    Time 5    Period Weeks    Status New    Target Date 01/28/22      PT LONG TERM GOAL #3   Title Patient will be able to demonstrate at least 5 degrees of left ankle dorsiflexion for improved gait mechanics with walking.    Time 5    Period Weeks    Status New    Target Date 01/28/22      PT LONG TERM GOAL #4   Title Patient will improve her LEFS score to at least 60% for improved function with her daily activities.    Time 5    Period Weeks    Status New    Target Date 01/28/22                   Plan - 01/06/22 1038     Clinical Impression Statement Patient presented in clinic with continued L heel pain. Patient  able to progress with progressive stretching with  eccentric stretcing. Patient still very limited with tenderness with self massage. Patient encouraged to continue self massage and icing at home and allow rest from the weekend.    Personal Factors and Comorbidities Time since onset of injury/illness/exacerbation;Comorbidity 3+    Comorbidities OA, osteoporosis, and history of breast cancer    Examination-Activity Limitations Locomotion Level;Stand;Stairs    Examination-Participation Restrictions Cleaning;Laundry    Stability/Clinical Decision Making Evolving/Moderate complexity    Rehab Potential Good    PT Frequency 2x / week    PT Duration Other (comment)    PT Treatment/Interventions Neuromuscular re-education;Therapeutic exercise;Therapeutic activities;Manual techniques;Passive range of motion;Patient/family education    PT Next Visit Plan nustep, gastroc/soleus stretch, ankle strengthening    Consulted and Agree with Plan of Care Patient             Patient will benefit from skilled therapeutic intervention in order to improve the following deficits and impairments:  Difficulty walking, Decreased range of motion, Pain, Decreased activity tolerance, Increased edema, Decreased strength, Decreased mobility  Visit Diagnosis: Pain in left foot  Stiffness of left foot, not elsewhere classified     Problem List Patient Active Problem List   Diagnosis Date Noted   Plantar fasciitis 12/12/2021   Heel spur, left 12/12/2021   Headache, acute 08/08/2021   Low back pain 05/14/2021   Acquired absence of right breast 09/22/2018   Port-A-Cath in place 05/11/2018   Genetic testing 04/12/2018   Family history of prostate cancer    Family history of colon cancer    Family history of melanoma    Malignant neoplasm of upper-inner quadrant of right breast in female, estrogen receptor positive ([place]) 03/10/2018   Rationale for Evaluation and Treatment Rehabilitation   Standley Brooking, PTA 01/06/2022, 10:45 AM  [facility] [address] [place], [place], [address] Phone: [phone]   Fax:  [phone]  Name: Claire Guzman MRN: [id number] Date of Birth: [birth year]

## 2022-01-08 ENCOUNTER — Other Ambulatory Visit: Payer: Self-pay | Admitting: Podiatry

## 2022-01-08 NOTE — Telephone Encounter (Signed)
Please advise 

## 2022-01-09 ENCOUNTER — Ambulatory Visit: Payer: Medicaid Other | Admitting: Physical Therapy

## 2022-01-09 ENCOUNTER — Encounter: Payer: Self-pay | Admitting: Physical Therapy

## 2022-01-09 DIAGNOSIS — M79672 Pain in left foot: Secondary | ICD-10-CM

## 2022-01-09 DIAGNOSIS — M25675 Stiffness of left foot, not elsewhere classified: Secondary | ICD-10-CM

## 2022-01-09 NOTE — Therapy (Signed)
[facility] [place], [place], [address] Phone: [phone]   Fax:  [phone]  Physical Therapy Treatment  Patient Details  Name: Claire Guzman MRN: [id number] Date of Birth: 01-May-1966 Referring Provider (PT): [place], [place]   Encounter Date: 01/09/2022   PT End of Session - 01/09/22 1050     Visit Number 4    Number of Visits 10    Date for PT Re-Evaluation 02/21/22    PT Start Time 0902    PT Stop Time 0943    PT Time Calculation (min) 41 min    Activity Tolerance Patient tolerated treatment well    Behavior During Therapy [facility] for tasks assessed/performed             Past Medical History:  Diagnosis Date   Abdominal pain, unspecified site    Anxiety state, unspecified    Complication of anesthesia    convulsions - when she has appendectomy about 12 years ago [facility]   Contact dermatitis and other eczema, due to unspecified cause    Essential hypertension, benign    Family history of colon cancer    Family history of melanoma    Family history of prostate cancer    GERD (gastroesophageal reflux disease)    Hematuria, unspecified    Hypertension    Lower back pain    Mixed hyperlipidemia    Rosacea    rt breast ca dx'd 01/2018   R breast cancer   Unspecified symptom associated with female genital organs     Past Surgical History:  Procedure Laterality Date   BREAST RECONSTRUCTION WITH PLACEMENT OF TISSUE EXPANDER AND ALLODERM Right 09/14/2018   Procedure: BREAST RECONSTRUCTION WITH PLACEMENT OF TISSUE EXPANDER AND ALLODERM;  Surgeon: Irene Limbo, MD;  Location: [place];  Service: Plastics;  Laterality: Right;   CESAREAN SECTION     DIAGNOSTIC LAPAROSCOPY     EYE SURGERY     Lasik   LAPAROSCOPIC APPENDECTOMY     LIPOSUCTION WITH LIPOFILLING Right 07/26/2019   Procedure: LIPOFILLING from abdomen to R chest;  Surgeon: Irene Limbo, MD;  Location: [place];   Service: Plastics;  Laterality: Right;   MASTECTOMY WITH RADIOACTIVE SEED GUIDED EXCISION AND AXILLARY SENTINEL LYMPH NODE BIOPSY Right 09/14/2018   Procedure: RIGHT NIPPLE SPARING MASTECTOMY WITH TARGETED DEEP RIGHT AXILLARY LYMPH NODE BIOPSY WITH RADIOACTIVE SEED LOCALIZATION AND RIGHT AXILLARY SENTINEL LYMPH NODE BIOPSY, FROZEN SECTION AND POSSIBLE COMPLETION AXILLARY LYMPH NODE DISSECTION INJECT BLUE DYE RIGHT BREAST;  Surgeon: Fanny Skates, MD;  Location: [place];  Service: General;  Laterality: Right;   PORT-A-CATH REMOVAL Left 07/26/2019   Procedure: REMOVAL PORT-A-CATH;  Surgeon: Irene Limbo, MD;  Location: [place];  Service: Plastics;  Laterality: Left;   PORTACATH PLACEMENT Left 04/13/2018   Procedure: INSERTION PORT-A-CATH WITH [place];  Surgeon: Fanny Skates, MD;  Location: [place];  Service: General;  Laterality: Left;   REMOVAL OF TISSUE EXPANDER AND PLACEMENT OF IMPLANT Right 07/26/2019   Procedure: REMOVAL OF TISSUE EXPANDER AND PLACEMENT OF IMPLANT;  Surgeon: Irene Limbo, MD;  Location: [place];  Service: Plastics;  Laterality: Right;   UTERINE FIBROID SURGERY      There were no vitals filed for this visit.   Subjective Assessment - 01/09/22 0904     Subjective Starting to have more pain in R foot and walking differently from compensation.    Pertinent History OA, osteoporosis, history of breast cancer,  chronic back pain    Limitations Walking;Standing;House hold activities    How long can you stand comfortably? pain as soon as she stands and walks    How long can you walk comfortably? pain as soon as she stands and walks    Patient Stated Goals walk without pain    Pain Location Ankle    Pain Orientation Left    Pain Descriptors / Indicators Discomfort    Pain Type Chronic pain    Pain Onset More than a month ago    Pain Frequency Constant                [facility] PT Assessment - 01/09/22 0001        Assessment   Medical Diagnosis Plantar fascitis    Referring Provider (PT) Jacqualyn Posey, DPM    Next MD Visit None    Prior Therapy No      Precautions   Precautions None                           [facility] Adult PT Treatment/Exercise - 01/09/22 0001       Manual Therapy   Manual Therapy Myofascial release    Myofascial Release IASTW/TPR to L achilles, gastroc, PF, posterior tibialis      Ankle Exercises: Aerobic   Recumbent Bike L3 x12 min      Ankle Exercises: Standing   Rocker Board 4 minutes    Heel Raises 20 reps;Limitations   eccentric off 2" step                         PT Long Term Goals - 12/24/21 1834       PT LONG TERM GOAL #1   Title Patient will be independent with her HEP.    Time 5    Period Weeks    Status New    Target Date 01/28/22      PT LONG TERM GOAL #2   Title Patient will be able to walk at least 30 minutes without her familiar pain exceeding a 6/10.    Time 5    Period Weeks    Status New    Target Date 01/28/22      PT LONG TERM GOAL #3   Title Patient will be able to demonstrate at least 5 degrees of left ankle dorsiflexion for improved gait mechanics with walking.    Time 5    Period Weeks    Status New    Target Date 01/28/22      PT LONG TERM GOAL #4   Title Patient will improve her LEFS score to at least 60% for improved function with her daily activities.    Time 5    Period Weeks    Status New    Target Date 01/28/22                   Plan - 01/09/22 1325     Clinical Impression Statement Patient still reporting increased L ankle pain and now having more R ankle pain due to compensatory gait strategies. Patient able to complete all stretches fairly well. Patient very sensitive to achilles and medial gastroc IASTW session. Patient advised that soreness may present after IASTW session.    Personal Factors and Comorbidities Time since onset of injury/illness/exacerbation;Comorbidity 3+     Comorbidities OA, osteoporosis, and history of breast cancer    Examination-Activity Limitations Locomotion Level;Stand;Stairs  Examination-Participation Restrictions Cleaning;Laundry    Stability/Clinical Decision Making Evolving/Moderate complexity    Rehab Potential Good    PT Frequency 2x / week    PT Duration Other (comment)    PT Treatment/Interventions Neuromuscular re-education;Therapeutic exercise;Therapeutic activities;Manual techniques;Passive range of motion;Patient/family education    PT Next Visit Plan nustep, gastroc/soleus stretch, ankle strengthening    Consulted and Agree with Plan of Care Patient             Patient will benefit from skilled therapeutic intervention in order to improve the following deficits and impairments:  Difficulty walking, Decreased range of motion, Pain, Decreased activity tolerance, Increased edema, Decreased strength, Decreased mobility  Visit Diagnosis: Pain in left foot  Stiffness of left foot, not elsewhere classified     Problem List Patient Active Problem List   Diagnosis Date Noted   Plantar fasciitis 12/12/2021   Heel spur, left 12/12/2021   Headache, acute 08/08/2021   Low back pain 05/14/2021   Acquired absence of right breast 09/22/2018   Port-A-Cath in place 05/11/2018   Genetic testing 04/12/2018   Family history of prostate cancer    Family history of colon cancer    Family history of melanoma    Malignant neoplasm of upper-inner quadrant of right breast in female, estrogen receptor positive ([place]) 03/10/2018   Rationale for Evaluation and Treatment Rehabilitation   Standley Brooking, PTA 01/09/2022, 1:34 PM  [facility] [address] [place], [place], [address] Phone: [phone]   Fax:  [phone]  Name: Kajuana Shareef MRN: [id number] Date of Birth: Feb 04, 1966

## 2022-01-13 ENCOUNTER — Telehealth: Payer: Self-pay | Admitting: *Deleted

## 2022-01-13 ENCOUNTER — Other Ambulatory Visit: Payer: Self-pay | Admitting: Podiatry

## 2022-01-13 ENCOUNTER — Ambulatory Visit: Payer: Medicaid Other | Admitting: *Deleted

## 2022-01-13 DIAGNOSIS — M79672 Pain in left foot: Secondary | ICD-10-CM | POA: Diagnosis not present

## 2022-01-13 DIAGNOSIS — M25675 Stiffness of left foot, not elsewhere classified: Secondary | ICD-10-CM

## 2022-01-13 MED ORDER — NAPROXEN 500 MG PO TABS: ORAL_TABLET | ORAL | 0 refills | Status: DC

## 2022-01-13 NOTE — Telephone Encounter (Signed)
Patient is calling to request something to help elevate the pain  and help walk properly until her upcoming appointment. Pain med's are not helping and can not get in to see physician until next week. Please advise/schedule for a sooner appointment.

## 2022-01-13 NOTE — Therapy (Addendum)
[facility] [place], [place], [address] Phone: [phone]   Fax:  [phone]  Physical Therapy Treatment  Patient Details  Name: Claire Guzman MRN: [id number] Date of Birth: 10-May-1966 Referring Provider (PT): [place], [place]   Encounter Date: 01/13/2022   PT End of Session - 01/13/22 1026     Visit Number 5    Number of Visits 10    Date for PT Re-Evaluation 02/21/22    PT Start Time 0913    PT Stop Time 0949    PT Time Calculation (min) 36 min             Past Medical History:  Diagnosis Date   Abdominal pain, unspecified site    Anxiety state, unspecified    Complication of anesthesia    convulsions - when she has appendectomy about 12 years ago [facility]   Contact dermatitis and other eczema, due to unspecified cause    Essential hypertension, benign    Family history of colon cancer    Family history of melanoma    Family history of prostate cancer    GERD (gastroesophageal reflux disease)    Hematuria, unspecified    Hypertension    Lower back pain    Mixed hyperlipidemia    Rosacea    rt breast ca dx'd 01/2018   R breast cancer   Unspecified symptom associated with female genital organs     Past Surgical History:  Procedure Laterality Date   BREAST RECONSTRUCTION WITH PLACEMENT OF TISSUE EXPANDER AND ALLODERM Right 09/14/2018   Procedure: BREAST RECONSTRUCTION WITH PLACEMENT OF TISSUE EXPANDER AND ALLODERM;  Surgeon: Irene Limbo, MD;  Location: [place];  Service: Plastics;  Laterality: Right;   CESAREAN SECTION     DIAGNOSTIC LAPAROSCOPY     EYE SURGERY     Lasik   LAPAROSCOPIC APPENDECTOMY     LIPOSUCTION WITH LIPOFILLING Right 07/26/2019   Procedure: LIPOFILLING from abdomen to R chest;  Surgeon: Irene Limbo, MD;  Location: [place];  Service: Plastics;  Laterality: Right;   MASTECTOMY WITH RADIOACTIVE SEED GUIDED EXCISION AND AXILLARY SENTINEL LYMPH  NODE BIOPSY Right 09/14/2018   Procedure: RIGHT NIPPLE SPARING MASTECTOMY WITH TARGETED DEEP RIGHT AXILLARY LYMPH NODE BIOPSY WITH RADIOACTIVE SEED LOCALIZATION AND RIGHT AXILLARY SENTINEL LYMPH NODE BIOPSY, FROZEN SECTION AND POSSIBLE COMPLETION AXILLARY LYMPH NODE DISSECTION INJECT BLUE DYE RIGHT BREAST;  Surgeon: Fanny Skates, MD;  Location: [place];  Service: General;  Laterality: Right;   PORT-A-CATH REMOVAL Left 07/26/2019   Procedure: REMOVAL PORT-A-CATH;  Surgeon: Irene Limbo, MD;  Location: [place];  Service: Plastics;  Laterality: Left;   PORTACATH PLACEMENT Left 04/13/2018   Procedure: INSERTION PORT-A-CATH WITH [place];  Surgeon: Fanny Skates, MD;  Location: [place];  Service: General;  Laterality: Left;   REMOVAL OF TISSUE EXPANDER AND PLACEMENT OF IMPLANT Right 07/26/2019   Procedure: REMOVAL OF TISSUE EXPANDER AND PLACEMENT OF IMPLANT;  Surgeon: Irene Limbo, MD;  Location: [place];  Service: Plastics;  Laterality: Right;   UTERINE FIBROID SURGERY      There were no vitals filed for this visit.   Subjective Assessment - 01/13/22 1024     Subjective Doing the same or maybe worse today with shooting pains in heel. Going to call MD today    Pertinent History OA, osteoporosis, history of breast cancer, chronic back pain    Limitations Walking;Standing;House hold activities    How long  can you stand comfortably? pain as soon as she stands and walks    How long can you walk comfortably? pain as soon as she stands and walks    Currently in Pain? Yes    Pain Score 7     Pain Location Ankle    Pain Orientation Left    Pain Descriptors / Indicators Discomfort    Pain Type Chronic pain    Pain Onset More than a month ago                               [facility] Adult PT Treatment/Exercise - 01/13/22 0001       Manual Therapy   Manual Therapy Myofascial release    Myofascial Release STW/ TPR to L  achilles, gastroc, PF, posterior tibialis with active TPR to plantar fascia      Ankle Exercises: Seated   ABC's --   Discussed HEP with ball rolls  64mns x 2 daily and added short foot exs. to HEP x 10 hold 10 secs 2-3x daily   Other Seated Ankle Exercises L foot ball rolls                          PT Long Term Goals - 12/24/21 1834       PT LONG TERM GOAL #1   Title Patient will be independent with her HEP.    Time 5    Period Weeks    Status New    Target Date 01/28/22      PT LONG TERM GOAL #2   Title Patient will be able to walk at least 30 minutes without her familiar pain exceeding a 6/10.    Time 5    Period Weeks    Status New    Target Date 01/28/22      PT LONG TERM GOAL #3   Title Patient will be able to demonstrate at least 5 degrees of left ankle dorsiflexion for improved gait mechanics with walking.    Time 5    Period Weeks    Status New    Target Date 01/28/22      PT LONG TERM GOAL #4   Title Patient will improve her LEFS score to at least 60% for improved function with her daily activities.    Time 5    Period Weeks    Status New    Target Date 01/28/22                   Plan - 01/13/22 1030     Clinical Impression Statement Pt arrived today 13 mins late and  not doing well due to increased pain in LT foot. Rx focused on HEP and STW to LT foot. Pt reports increased shooting pain in the heel/ foot this weekend and plans to call MD today after PT appt.    Personal Factors and Comorbidities Time since onset of injury/illness/exacerbation;Comorbidity 3+    Comorbidities OA, osteoporosis, and history of breast cancer    Examination-Activity Limitations Locomotion Level;Stand;Stairs    Stability/Clinical Decision Making Evolving/Moderate complexity    Rehab Potential Good    PT Frequency 2x / week    PT Duration Other (comment)    PT Treatment/Interventions Neuromuscular re-education;Therapeutic exercise;Therapeutic  activities;Manual techniques;Passive range of motion;Patient/family education    PT Next Visit Plan nustep, gastroc/soleus stretch, ankle strengthening. Pt to call MD. ON hold?  Patient will benefit from skilled therapeutic intervention in order to improve the following deficits and impairments:  Difficulty walking, Decreased range of motion, Pain, Decreased activity tolerance, Increased edema, Decreased strength, Decreased mobility  Visit Diagnosis: Pain in left foot  Stiffness of left foot, not elsewhere classified     Problem List Patient Active Problem List   Diagnosis Date Noted   Plantar fasciitis 12/12/2021   Heel spur, left 12/12/2021   Headache, acute 08/08/2021   Low back pain 05/14/2021   Acquired absence of right breast 09/22/2018   Port-A-Cath in place 05/11/2018   Genetic testing 04/12/2018   Family history of prostate cancer    Family history of colon cancer    Family history of melanoma    Malignant neoplasm of upper-inner quadrant of right breast in female, estrogen receptor positive ([place]) 03/10/2018   Rationale for Evaluation and Treatment Rehabilitation  Wendelin Reader,CHRIS, PTA 01/13/2022, 1:02 PM  [facility] [address] [place], [place], [address] Phone: [phone]   Fax:  [phone]  Name: Claire Guzman MRN: [id number] Date of Birth: 1965-11-22  PHYSICAL THERAPY DISCHARGE SUMMARY  Visits from Start of Care: 5  Current functional level related to goals / functional outcomes: Patient was unable to meet her goals for skilled physical therapy and is able to be discharged at this time.    Remaining deficits: Pain and ROM    Education / Equipment: HEP    Patient agrees to discharge. Patient goals were not met. Patient is being discharged due to lack of progress.  Jacqulynn Cadet, PT, DPT

## 2022-01-14 ENCOUNTER — Ambulatory Visit: Payer: Medicaid Other | Admitting: Podiatry

## 2022-01-14 DIAGNOSIS — S86312D Strain of muscle(s) and tendon(s) of peroneal muscle group at lower leg level, left leg, subsequent encounter: Secondary | ICD-10-CM | POA: Diagnosis not present

## 2022-01-14 DIAGNOSIS — M722 Plantar fascial fibromatosis: Secondary | ICD-10-CM

## 2022-01-14 DIAGNOSIS — M7732 Calcaneal spur, left foot: Secondary | ICD-10-CM | POA: Diagnosis not present

## 2022-01-14 MED ORDER — METHYLPREDNISOLONE 4 MG PO TBPK: ORAL_TABLET | ORAL | 0 refills | Status: DC

## 2022-01-14 NOTE — Patient Instructions (Addendum)
While on the steroid hold off on any anti-inflammatory medicine such as naproxen or ibuprofen.  When she finished the steroid you can start taking this.  For instructions on how to put on your Tri-Lock Ankle Brace, please visit [email or web address]

## 2022-01-20 ENCOUNTER — Telehealth: Payer: Self-pay | Admitting: Podiatry

## 2022-01-20 ENCOUNTER — Ambulatory Visit: Payer: Medicaid Other | Admitting: Podiatry

## 2022-02-06 ENCOUNTER — Emergency Department (HOSPITAL_BASED_OUTPATIENT_CLINIC_OR_DEPARTMENT_OTHER): Payer: Medicaid Other | Admitting: Radiology

## 2022-02-06 ENCOUNTER — Encounter (HOSPITAL_BASED_OUTPATIENT_CLINIC_OR_DEPARTMENT_OTHER): Payer: Self-pay | Admitting: Emergency Medicine

## 2022-02-06 ENCOUNTER — Emergency Department (HOSPITAL_BASED_OUTPATIENT_CLINIC_OR_DEPARTMENT_OTHER)
Admission: EM | Admit: 2022-02-06 | Discharge: 2022-02-06 | Disposition: A | Discharge status: Home / Self Care | Payer: Medicaid Other | Attending: Emergency Medicine | Admitting: Emergency Medicine

## 2022-02-06 ENCOUNTER — Other Ambulatory Visit: Payer: Self-pay

## 2022-02-06 DIAGNOSIS — S5012XA Contusion of left forearm, initial encounter: Secondary | ICD-10-CM

## 2022-02-06 DIAGNOSIS — S39012A Strain of muscle, fascia and tendon of lower back, initial encounter: Secondary | ICD-10-CM

## 2022-02-06 DIAGNOSIS — S50812A Abrasion of left forearm, initial encounter: Secondary | ICD-10-CM | POA: Diagnosis not present

## 2022-02-06 DIAGNOSIS — I10 Essential (primary) hypertension: Secondary | ICD-10-CM

## 2022-02-06 DIAGNOSIS — Z23 Encounter for immunization: Secondary | ICD-10-CM | POA: Diagnosis not present

## 2022-02-06 DIAGNOSIS — W19XXXA Unspecified fall, initial encounter: Secondary | ICD-10-CM

## 2022-02-06 DIAGNOSIS — Z79899 Other long term (current) drug therapy: Secondary | ICD-10-CM | POA: Insufficient documentation

## 2022-02-06 DIAGNOSIS — W010XXA Fall on same level from slipping, tripping and stumbling without subsequent striking against object, initial encounter: Secondary | ICD-10-CM | POA: Insufficient documentation

## 2022-02-06 DIAGNOSIS — S59912A Unspecified injury of left forearm, initial encounter: Secondary | ICD-10-CM | POA: Diagnosis present

## 2022-02-06 MED ORDER — TRAMADOL HCL 50 MG PO TABS
50.0000 mg | ORAL_TABLET | Freq: Once | ORAL | Status: AC
  Administered 2022-02-06: 50 mg via ORAL
  Filled 2022-02-06: qty 1

## 2022-02-06 MED ORDER — TETANUS-DIPHTH-ACELL PERTUSSIS 5-2.5-18.5 LF-MCG/0.5 IM SUSY
0.5000 mL | PREFILLED_SYRINGE | Freq: Once | INTRAMUSCULAR | Status: AC
Start: 2022-02-06 — End: 2022-02-06
  Administered 2022-02-06: 0.5 mL via INTRAMUSCULAR
  Filled 2022-02-06: qty 0.5

## 2022-02-06 MED ORDER — METHOCARBAMOL 750 MG PO TABS: 750.0000 mg | ORAL_TABLET | Freq: Three times a day (TID) | ORAL | 0 refills | Status: DC | PRN

## 2022-02-06 NOTE — ED Triage Notes (Signed)
Took tramadol around 7:20

## 2022-02-06 NOTE — ED Triage Notes (Signed)
Fall,trip over dog out front door onto concrete pad.  Pain in left arm, lac, hematoma mid forearm. Able to move all finger and extend elbow. Good cap refill. Pain in low back.

## 2022-02-06 NOTE — ED Provider Notes (Signed)
Claire Guzman   CSN: [id number] Arrival date & time: 02/06/22  1952     History  Chief Complaint  Patient presents with   Claire Guzman Claire Guzman is a 56 y.o. female.  Pt c/o trip and fall today, tripping over dog falling onto concrete. C/o pain to left forearm, superficial abrasion to area. Last tetanus not known. Also c/o low back pain, dull, moderate, non radiating. No saddle area or leg numbness. No weakness. Has been ambulatory. Denies hitting head. No headache. No neck or upper back pain. No anticoag use. No cp or sob. No abd pain or nv. Daughter drove to ED.   The history is provided by the patient, medical records and a relative.  Fall Pertinent negatives include no chest pain, no abdominal pain, no headaches and no shortness of breath.       Home Medications Prior to Admission medications   Medication Sig Start Date End Date Taking? Authorizing Provider  ALPRAZolam (XANAX) 0.5 MG tablet TAKE 1 TABLET(0.5 MG) BY MOUTH TWICE DAILY AS NEEDED FOR ANXIETY 09/27/21   Truitt Merle, MD  atenolol (TENORMIN) 50 MG tablet Take 50 mg by mouth at bedtime.    [provider]  Cholecalciferol (VITAMIN D3) 50 MCG (2000 UT) capsule Take 2,000 Units by mouth daily. 05/13/20   [provider]  dexamethasone (DECADRON) 4 MG tablet Take 1 tablet (4 mg total) by mouth daily. 08/08/21   Ventura Sellers, MD  exemestane (AROMASIN) 25 MG tablet TAKE 1 TABLET(25 MG) BY MOUTH DAILY AFTER BREAKFAST 06/14/21   Alla Feeling, NP  ibuprofen (ADVIL) 800 MG tablet Take 1 tablet (800 mg total) by mouth every 8 (eight) hours as needed. 08/02/21   Truitt Merle, MD  lactulose (Echo) 10 GM/15ML solution TAKE 20 MLS BY MOUTH EVERY 4 HOURS AS NEEDED FOR CONSTIPATION 03/30/21   Truitt Merle, MD  methylPREDNISolone (MEDROL DOSEPAK) 4 MG TBPK tablet Take as directed 01/14/22   Trula Slade, DPM  naproxen (NAPROSYN) 500 MG tablet TAKE 1 TABLET(500 MG)  BY MOUTH TWICE DAILY WITH A MEAL 01/13/22   Trula Slade, DPM  potassium chloride SA (KLOR-CON M) 20 MEQ tablet Take 1 tablet (20 mEq total) by mouth 2 (two) times daily. 08/24/21   Truitt Merle, MD  traMADol (ULTRAM) 50 MG tablet TAKE 1 TABLET BY MOUTH EVERY 12 HOURS AS NEEDED 12/17/21   Trula Slade, DPM  valACYclovir (VALTREX) 500 MG tablet valacyclovir 500 mg tablet  TAKE 1 TABLET BY MOUTH TWICE DAILY    [provider]  venlafaxine XR (EFFEXOR-XR) 150 MG 24 hr capsule TAKE 1 CAPSULE(150 MG) BY MOUTH DAILY WITH BREAKFAST 08/31/21   Truitt Merle, MD  Zinc 50 MG TABS Take 1 tablet by mouth daily. 05/13/20   [provider]  omeprazole (PRILOSEC) 20 MG capsule Take 1 capsule (20 mg total) by mouth 2 (two) times daily before a meal. Patient not taking: Reported on 01/03/2020 05/03/18 10/30/20  Truitt Merle, MD  prochlorperazine (COMPAZINE) 10 MG tablet TAKE 1 TABLET BY MOUTH EVERY 6 HOURS AS NEEDED FOR NAUSEA OR VOMITING 08/10/18 08/12/18  Truitt Merle, MD      Allergies    Metrogel [metronidazole], Other, Paxil [paroxetine hcl], Sulfamethoxazole, Zomig [zolmitriptan], and Prednisone    Review of Systems   Review of Systems  Constitutional:  Negative for fever.  Respiratory:  Negative for shortness of breath.   Cardiovascular:  Negative for chest pain.  Gastrointestinal:  Negative for abdominal pain, nausea and vomiting.  Musculoskeletal:  Positive for back pain. Negative for neck pain.  Skin:  Positive for wound.  Neurological:  Negative for weakness, numbness and headaches.    Physical Exam Updated Vital Signs BP (!) 174/98 (BP Location: Right Arm)   Pulse 79   Temp 98.2 F (36.8 C)   Resp 19   LMP 03/28/2018 (Within Days)   SpO2 98%  Physical Exam Vitals and nursing Guzman reviewed.  Constitutional:      Appearance: Normal appearance. She is well-developed.  HENT:     Head: Atraumatic.     Nose: Nose normal.     Mouth/Throat:     Mouth: Mucous membranes are moist.   Eyes:     General: No scleral icterus.    Conjunctiva/sclera: Conjunctivae normal.     Pupils: Pupils are equal, round, and reactive to light.  Neck:     Trachea: No tracheal deviation.  Cardiovascular:     Rate and Rhythm: Normal rate.     Pulses: Normal pulses.  Pulmonary:     Effort: Pulmonary effort is normal. No respiratory distress.  Abdominal:     General: There is no distension.     Tenderness: There is no abdominal tenderness.  Genitourinary:    Comments: No cva tenderness.  Musculoskeletal:        General: No swelling.     Cervical back: Normal range of motion and neck supple. No rigidity or tenderness. No muscular tenderness.     Comments: Lumbar tenderness, otherwise, CTLS spine, non tender, aligned, no step off. Soft tissue swelling and tenderness dorsal aspect left forearm, small superficial abrasion to area. No fb seen or felt. Compartments of forearm are soft, not tense. No 'pain out of proportion'. Radial pulse palp. No pain w passive rom at wrist or elbow. No other focal bony tenderness noted.   Skin:    General: Skin is warm and dry.     Findings: No rash.  Neurological:     Mental Status: She is alert.     Comments: Alert, speech normal.  GCS 15. Motor/sens grossly intact bil.   Psychiatric:        Mood and Affect: Mood normal.     ED Results / Procedures / Treatments   Labs (all labs ordered are listed, but only abnormal results are displayed) Labs Reviewed - No data to display  EKG None  Radiology DG Lumbar Spine Complete  Result Date: 02/06/2022 CLINICAL DATA:  Tripped over dog. Fall onto concrete. Low back pain. EXAM: LUMBAR SPINE - COMPLETE 4+ VIEW COMPARISON:  03/27/2021 FINDINGS: There is no evidence of lumbar spine fracture. Alignment is normal. Intervertebral disc spaces are maintained. No significant facet arthropathy or other bone lesions identified. IMPRESSION: Negative. Electronically Signed   By: Marlaine Hind M.D.   On: 02/06/2022 21:56    DG Elbow Complete Left  Result Date: 02/06/2022 CLINICAL DATA:  Fall, pain EXAM: LEFT ELBOW - COMPLETE 3+ VIEW COMPARISON:  None Available. FINDINGS: There is no evidence of fracture, dislocation, or joint effusion. There is no evidence of arthropathy or other focal bone abnormality. Soft tissues are unremarkable. IMPRESSION: Negative. Electronically Signed   By: Rolm Baptise M.D.   On: 02/06/2022 21:10   DG Forearm Left  Result Date: 02/06/2022 CLINICAL DATA:  Fall, pain EXAM: LEFT FOREARM - 2 VIEW COMPARISON:  None Available. FINDINGS: Soft tissue swelling noted in the posterior mid forearm. No acute bony abnormality. Specifically,  no fracture, subluxation, or dislocation. IMPRESSION: No acute bony abnormality. Electronically Signed   By: Rolm Baptise M.D.   On: 02/06/2022 21:08   DG Wrist Complete Left  Result Date: 02/06/2022 CLINICAL DATA:  Fall, pain EXAM: LEFT WRIST - COMPLETE 3+ VIEW COMPARISON:  None Available. FINDINGS: There is no evidence of fracture or dislocation. There is no evidence of arthropathy or other focal bone abnormality. Soft tissues are unremarkable. IMPRESSION: Negative. Electronically Signed   By: Rolm Baptise M.D.   On: 02/06/2022 21:07    Procedures Procedures    Medications Ordered in ED Medications  Tdap (BOOSTRIX) injection 0.5 mL (has no administration in time range)    ED Course/ Medical Decision Making/ A&P                           Medical Decision Making Problems Addressed: Abrasion of left forearm, initial encounter: acute illness or injury Accidental fall, initial encounter: acute illness or injury with systemic symptoms that poses a threat to life or bodily functions Contusion of left forearm, initial encounter: acute illness or injury Essential hypertension: chronic illness or injury with exacerbation, progression, or side effects of treatment Lumbosacral strain, initial encounter: acute illness or injury  Amount and/or Complexity of Data  Reviewed Independent Historian:     Details: family, hx External Data Reviewed: notes. Radiology: ordered and independent interpretation performed. Decision-making details documented in ED Course.  Risk Prescription drug management.   Imaging ordered.   Reviewed nursing notes and prior charts for additional history. Additional hx: fam member, drove pt.   Ultram po.   Xrays reviewed/interpreted by me - no fx.   Wound care/sterile dressing. Tetanus IM.  Icepack.   Pt appears stable for d/c.   Return precautions provided.           Final Clinical Impression(s) / ED Diagnoses Final diagnoses:  None    Rx / DC Orders ED Discharge Orders     None         Lajean Saver, MD 02/06/22 2224

## 2022-02-06 NOTE — ED Notes (Signed)
ED Provider at bedside. 

## 2022-02-06 NOTE — Discharge Instructions (Addendum)
It was our pleasure to provide your ER care today - we hope that you feel better.  Ice/coldpack to sore area.   Take acetaminophen or ibuprofen as need for pain. You may also take robaxin as need for muscle pain/spasm - no driving when taking.   Follow up with primary care doctor in 1-2 weeks if symptoms fail to improve/resolve. Also follow up with your doctor regarding your blood pressure that is high tonight.   Return to ER if worse, new symptoms, severe/intractable pain, numbness/weakness, or other concern.

## 2022-02-17 ENCOUNTER — Inpatient Hospital Stay: Payer: Medicaid Other | Attending: Hematology

## 2022-02-17 ENCOUNTER — Encounter: Payer: Self-pay | Admitting: Hematology

## 2022-02-17 ENCOUNTER — Other Ambulatory Visit: Payer: Self-pay

## 2022-02-17 ENCOUNTER — Inpatient Hospital Stay (HOSPITAL_BASED_OUTPATIENT_CLINIC_OR_DEPARTMENT_OTHER): Payer: Medicaid Other | Admitting: Hematology

## 2022-02-17 VITALS — BP 134/77 | HR 60 | Temp 98.3°F | Resp 15 | Wt 167.4 lb

## 2022-02-17 DIAGNOSIS — Z1231 Encounter for screening mammogram for malignant neoplasm of breast: Secondary | ICD-10-CM

## 2022-02-17 DIAGNOSIS — Z9011 Acquired absence of right breast and nipple: Secondary | ICD-10-CM | POA: Diagnosis not present

## 2022-02-17 DIAGNOSIS — G43909 Migraine, unspecified, not intractable, without status migrainosus: Secondary | ICD-10-CM | POA: Diagnosis not present

## 2022-02-17 DIAGNOSIS — M79603 Pain in arm, unspecified: Secondary | ICD-10-CM | POA: Diagnosis not present

## 2022-02-17 DIAGNOSIS — W19XXXA Unspecified fall, initial encounter: Secondary | ICD-10-CM | POA: Insufficient documentation

## 2022-02-17 DIAGNOSIS — M25559 Pain in unspecified hip: Secondary | ICD-10-CM | POA: Diagnosis not present

## 2022-02-17 DIAGNOSIS — S40022A Contusion of left upper arm, initial encounter: Secondary | ICD-10-CM | POA: Diagnosis not present

## 2022-02-17 DIAGNOSIS — C50211 Malignant neoplasm of upper-inner quadrant of right female breast: Secondary | ICD-10-CM | POA: Insufficient documentation

## 2022-02-17 DIAGNOSIS — Z9221 Personal history of antineoplastic chemotherapy: Secondary | ICD-10-CM | POA: Insufficient documentation

## 2022-02-17 DIAGNOSIS — C773 Secondary and unspecified malignant neoplasm of axilla and upper limb lymph nodes: Secondary | ICD-10-CM | POA: Diagnosis not present

## 2022-02-17 DIAGNOSIS — Z923 Personal history of irradiation: Secondary | ICD-10-CM | POA: Insufficient documentation

## 2022-02-17 DIAGNOSIS — Z808 Family history of malignant neoplasm of other organs or systems: Secondary | ICD-10-CM | POA: Insufficient documentation

## 2022-02-17 DIAGNOSIS — M810 Age-related osteoporosis without current pathological fracture: Secondary | ICD-10-CM | POA: Diagnosis not present

## 2022-02-17 DIAGNOSIS — Z170 Estrogen receptor positive status [ER+]: Secondary | ICD-10-CM | POA: Diagnosis not present

## 2022-02-17 DIAGNOSIS — Z800 Family history of malignant neoplasm of digestive organs: Secondary | ICD-10-CM | POA: Diagnosis not present

## 2022-02-17 DIAGNOSIS — F419 Anxiety disorder, unspecified: Secondary | ICD-10-CM | POA: Insufficient documentation

## 2022-02-17 DIAGNOSIS — G4700 Insomnia, unspecified: Secondary | ICD-10-CM | POA: Insufficient documentation

## 2022-02-17 LAB — COMPREHENSIVE METABOLIC PANEL
ALT: 13 U/L (ref 0–44)
AST: 17 U/L (ref 15–41)
Albumin: 4.5 g/dL (ref 3.5–5.0)
Alkaline Phosphatase: 87 U/L (ref 38–126)
Anion gap: 7 (ref 5–15)
BUN: 13 mg/dL (ref 6–20)
CO2: 28 mmol/L (ref 22–32)
Calcium: 9.7 mg/dL (ref 8.9–10.3)
Chloride: 106 mmol/L (ref 98–111)
Creatinine, Ser: 0.68 mg/dL (ref 0.44–1.00)
GFR, Estimated: >60 mL/min (ref >60)
Glucose, Bld: 102 mg/dL — ABNORMAL HIGH (ref 70–99)
Potassium: 3.8 mmol/L (ref 3.5–5.1)
Sodium: 141 mmol/L (ref 135–145)
Total Bilirubin: 0.8 mg/dL (ref 0.3–1.2)
Total Protein: 7.2 g/dL (ref 6.5–8.1)

## 2022-02-17 LAB — CBC WITH DIFFERENTIAL/PLATELET
Abs Immature Granulocytes: 0.02 10*3/uL (ref 0.00–0.07)
Basophils Absolute: 0 10*3/uL (ref 0.0–0.1)
Basophils Relative: 0 %
Eosinophils Absolute: 0.1 10*3/uL (ref 0.0–0.5)
Eosinophils Relative: 2 %
HCT: 35.5 % — ABNORMAL LOW (ref 36.0–46.0)
Hemoglobin: 12.9 g/dL (ref 12.0–15.0)
Immature Granulocytes: 0 %
Lymphocytes Relative: 36 %
Lymphs Abs: 1.9 10*3/uL (ref 0.7–4.0)
MCH: 32.7 pg (ref 26.0–34.0)
MCHC: 36.3 g/dL — ABNORMAL HIGH (ref 30.0–36.0)
MCV: 89.9 fL (ref 80.0–100.0)
Monocytes Absolute: 0.3 10*3/uL (ref 0.1–1.0)
Monocytes Relative: 6 %
Neutro Abs: 2.8 10*3/uL (ref 1.7–7.7)
Neutrophils Relative %: 56 %
Platelets: 179 10*3/uL (ref 150–400)
RBC: 3.95 MIL/uL (ref 3.87–5.11)
RDW: 12.4 % (ref 11.5–15.5)
WBC: 5.2 10*3/uL (ref 4.0–10.5)
nRBC: 0 % (ref 0.0–0.2)

## 2022-02-17 LAB — MAGNESIUM: Magnesium: 1.8 mg/dL (ref 1.7–2.4)

## 2022-02-17 MED ORDER — TRAZODONE HCL 50 MG PO TABS: 50.0000 mg | ORAL_TABLET | Freq: Every evening | ORAL | 0 refills | Status: DC | PRN

## 2022-02-17 NOTE — Progress Notes (Signed)
[place]   [phone] [phone]) [phone]   Clinic Follow up Note   Patient Care Team: Lujean Amel, MD as PCP - General (Family Medicine) Fanny Skates, MD as Consulting Physician (General Surgery) Truitt Merle, MD as Consulting Physician (Hematology) Eppie Gibson, MD as Attending Physician (Radiation Oncology) Mauro Kaufmann, RN as Oncology Nurse Navigator Rockwell Germany, RN as Oncology Nurse Navigator Medicine, [facility] (Family Medicine)  Date of Service:  02/17/2022  CHIEF COMPLAINT: f/u of right breast cancer  CURRENT THERAPY:  -Breast cancer surveillance due to AI intolerance -Zometa q6 months x2 years, started 03/02/20  ASSESSMENT & PLAN:  Claire Guzman is a 56 y.o. post-menopausal female with   1. Malignant neoplasm of upper-inner quadrant of right breast in female, invasive ductal carcinoma, cT2N1M0 stage IB, ER+/PR+/HER2+, G3  -Diagnosed 02/2018, s/p neoadjuvant TCHP, R mastectomy/ALND, and adjuvant radiation. She completed 1 year maintenance herceptin/perjeta in 03/2019 and Neratinib 08/2019 - 08/2020. -s/p reconstruction by Dr. Iran Planas 07/26/19 -she started anti-estrogen therapy 12/2018, did not tolerate tamoxifen, anastrozole, letrozole, or exemestane (including low dose). She has been off since 06/24/21. -most recent left mammogram on 05/10/21 was negative. -she is clinically doing well from a breast cancer standpoint. Labs reviewed, WNL. Physical exam was unremarkable. There is no evidence of recurrence -Next mammo 04/2022   2. Osteoporosis -DEXA on 08/26/19 showed osteoporosis of left femur (T-score of -2.7) -she completed 2 years Zometa 02/2020 - 08/2021 -repeat DEXA 10/03/21 showed slight improvement to -2.6 in left hip. -I previously advised her to take calcium and vit D.   3. Anxiety, Insomnia -has a history of anxiety prior to diagnosis. -she reports social stressors that attribute to her anxiety, and  she has had panic attacks lately. -she is currently on Xanax as needed. -she reports difficulty sleeping; I will call in trazadone. Given potential drug interaction with effexor, I reminded her not to take trazadone and effexor at the same time; she endorses taking effexor in the morning.  4. Headaches -started out as sinus/tension headaches and developed into migraines -she saw Dr. Mickeal Skinner on 08/08/21 and was prescribed dexamethasone to use as needed.     PLAN: -I called in trazadone 47m as needed for insomnia  -mammogram due 04/2022, I ordered today -lab and f/u with NP Lacie in 6 months   No problem-specific Assessment & Plan notes found for this encounter.   SUMMARY OF ONCOLOGIC HISTORY: Oncology History Overview Note  Cancer Staging Malignant neoplasm of upper-inner quadrant of right breast in female, estrogen receptor positive ([place] Staging form: Breast, AJCC 8th Edition - Clinical stage from 03/08/2018: Stage IB (cT2, cN1, cM0, G3, ER+, PR+, HER2+) - Signed by FTruitt Merle MD on 03/16/2018     Malignant neoplasm of upper-inner quadrant of right breast in female, estrogen receptor positive ([facility]  02/24/2018 Mammogram   screening mammogram on 02/24/2018 that was benign   03/04/2018 Mammogram   diagnostic mammogram on 03/04/2018 due to a palpable mass on her right breast. Diagnostic mammogram, breast [facility] and biopsy revealed suspicious mass in the right breast at 2:30 at the palpable site of concern and one suspicious right axillary LN.   03/08/2018 Cancer Staging   Staging form: Breast, AJCC 8th Edition - Clinical stage from 03/08/2018: Stage IB (cT2, cN1, cM0, G3, ER+, PR+, HER2+) - Signed by FTruitt Merle MD on 03/16/2018   03/08/2018 Receptors her2   Her2 positive, ER 90% PR 5% and Ki67 60%   03/08/2018  Pathology Results   Diagnosis 1. Breast, right, needle core biopsy, 2:30 - INVASIVE DUCTAL CARCINOMA, GRADE 3. - LYMPHOVASCULAR SPACE INVOLVEMENT BY TUMOR. 2. Lymph node,  needle/core biopsy, right axilla - METASTATIC CARCINOMA INVOLVING SCANT LYMPHOID TISSUE.   03/10/2018 Initial Diagnosis   Malignant neoplasm of upper-inner quadrant of right breast in female, estrogen receptor positive ([place])   03/20/2018 Imaging   MRI brain 03/20/18 IMPRESSION: No evidence of metastatic disease.  Normal appearance of brain. Slightly heterogeneous marrow pattern of the upper cervical spine, nonspecific but likely benign, especially in the setting of early stage disease.    03/24/2018 Echocardiogram   Baseline ECHO 03/24/18 Study Conclusions - Left ventricle: The cavity size was normal. Wall thickness was   increased in a pattern of mild LVH. Systolic function was normal.   The estimated ejection fraction was in the range of 60% to 65%.   Wall motion was normal; there were no regional wall motion   abnormalities. Doppler parameters are consistent with abnormal   left ventricular relaxation (grade 1 diastolic dysfunction). - Right ventricle: The cavity size was mildly dilated. Wall   thickness was normal.   03/25/2018 Imaging   MRI Breast B/l 03/25/18 IMPRESSION: 1. The patient's primary malignancy in the posterior right breast measures 3.2 x 2 x 2.6 cm with apparent invasion of the underlying pectoralis muscle. Numerous surrounding abnormal enhancing masses, consistent with satellite lesions, extend from 11 o'clock in the right breast into the inferolateral right breast with a total span of suspected disease measuring 4.5 x 4.6 x 6.4 cm in AP, transverse, and craniocaudal dimension. The suspected extent of disease involves the superior and inferolateral quadrants. The primary malignancy also extends just across midline into the superior medial quadrant. 2. No MRI evidence of malignancy in the left breast. 3. Known metastatic noted in the right axilla.   03/25/2018 Imaging   Whole body bone scan 03/25/18 IMPRESSION: No scintigraphic evidence skeletal metastasis.    03/25/2018 Imaging   CT CAP W Contrast 03/25/18 IMPRESSION: 1. Mass within the deep aspect of the right breast along the pectoralis muscle compatible with primary breast malignancy. 2. There is suggestion of two additional possible masses within the lateral aspect of the right breast versus nodular appearing breast tissue. Recommend dedicated evaluation with bilateral breast MRI. 3. Mildly thickened right axillary lymph node compatible with metastatic adenopathy, recently biopsied. 4. Multiple bilateral pulmonary nodules are indeterminate, potentially sequelae of prior infectious/inflammatory process. Recommend attention on follow-up. Consider follow-up CT in 6 months. 5. Portal venous gas is demonstrated within the left hepatic lobe. Additionally, there is wall thickening of the cecum and ascending colon with small amount of gas within the pericolonic vasculature. Constellation of findings is indeterminate in etiology however may be secondary to colitis at this location with considerations including infectious, inflammatory or ischemic etiologies. Correlate for recent procedure. If patient is not up-to-date for colonic screening, recommend further evaluation with colonoscopy to exclude the possibility of colonic mass within the cecum/ascending colon. 6. These results were called by telephone at the time of interpretation on 03/25/2018 at 9:31 am to Dr. Truitt Merle , who verbally acknowledged these results.   03/30/2018 - 04/08/2019 Chemotherapy   Neoadjuvant TCHP every 3 weeks starting 03/30/18. Completed 6 cycles of TCHP on 07/23/18.  Continued with maintenance Herceptin/Perjeta q3weeks to complete 1 year.    04/09/2018 Genetic Testing   Negative genetic testing on the [employer] Common Hereditary Cancers Panel. The Common Hereditary Cancers Panel offered by [employer] includes sequencing  and/or deletion duplication testing of the following 47 genes: APC, ATM, AXIN2, BARD1, BMPR1A, BRCA1, BRCA2,  BRIP1, CDH1, CDKN2A (p14ARF), CDKN2A (p16INK4a), CKD4, CHEK2, CTNNA1, DICER1, EPCAM (Deletion/duplication testing only), GREM1 (promoter region deletion/duplication testing only), KIT, MEN1, MLH1, MSH2, MSH3, MSH6, MUTYH, NBN, NF1, NHTL1, PALB2, PDGFRA, PMS2, POLD1, POLE, PTEN, RAD50, RAD51C, RAD51D, SDHB, SDHC, SDHD, SMAD4, SMARCA4. STK11, TP53, TSC1, TSC2, and VHL.  The following genes were evaluated for sequence changes only: SDHA and HOXB13 c.251G>A variant only.   Genetic testing did detect a Variant of Unknown Significance (VUS) in the POLD1 gene called c.985C>T (p.Pro329Ser). At this time, it is unknown if this variant is associated with increased cancer risk or if this is a normal finding, but most variants such as this get reclassified to being inconsequential. It should not be used to make medical management decisions.   The report date is 04/09/2018.   07/24/2018 Breast MRI   IMPRESSION: 1. Resolution of the enhancing mass (known cancer) and small satellite lesions previously seen in the right breast following chemotherapy.   2. The metastatic lymph node in the right axilla has decreased in size.   3.  No MRI evidence of left breast malignancy.   09/14/2018 Surgery   RIGHT NIPPLE SPARING MASTECTOMY WITH TARGETED DEEP RIGHT AXILLARY LYMPH NODE BIOPSY WITH RADIOACTIVE SEED LOCALIZATION AND RIGHT AXILLARY SENTINEL LYMPH NODE BIOPSY by Dr. Dalbert Batman  09/14/18    09/14/2018 Pathology Results   Diagnosis 1. Lymph node, sentinel, biopsy, right axillary with radioactive seed - ONE OF ONE LYMPH NODES NEGATIVE FOR CARCINOMA (0/1). - MILD TREATMENT EFFECT. - BIOPSY SITE. 2. Lymph node, sentinel, biopsy, right axillary - ONE OF ONE LYMPH NODES NEGATIVE FOR CARCINOMA (0/1). 3. Lymph node, sentinel, biopsy, right - ONE OF ONE LYMPH NODES NEGATIVE FOR CARCINOMA (0/1). 4. Lymph node, sentinel, biopsy, right - ONE OF ONE LYMPH NODES NEGATIVE FOR CARCINOMA (0/1). 5. Lymph node, sentinel, biopsy,  right - ONE OF ONE LYMPH NODES NEGATIVE FOR CARCINOMA (0/1). 6. Lymph node, sentinel, biopsy, right - ONE OF ONE LYMPH NODES NEGATIVE FOR CARCINOMA (0/1). 7. Lymph node, sentinel, biopsy, right - ONE OF ONE LYMPH NODES NEGATIVE FOR CARCINOMA (0/1). 8. Lymph node, sentinel, biopsy, right - ONE OF ONE LYMPH NODES NEGATIVE FOR CARCINOMA (0/1). 9. Lymph node, sentinel, biopsy, right - ONE OF ONE LYMPH NODES NEGATIVE FOR CARCINOMA (0/1). 10. Lymph node, sentinel, biopsy, right - ONE OF ONE LYMPH NODES NEGATIVE FOR CARCINOMA (0/1). 11. Lymph node, sentinel, biopsy, right - ONE OF ONE LYMPH NODES NEGATIVE FOR CARCINOMA (0/1). 12. Lymph node, sentinel, biopsy, right - ONE OF ONE LYMPH NODES NEGATIVE FOR CARCINOMA (0/1). 13. Nipple Biopsy, right - BENIGN NIPPLE TISSUE. 14. Breast, simple mastectomy, right - NO RESIDUAL CARCINOMA IDENTIFIED. - TREATMENT EFFECT. - FIBROCYSTIC CHANGE. - ONE OF ONE LYMPH NODES NEGATIVE FOR CARCINOMA (0/1). - SEE ONCOLOGY TABLE.   09/14/2018 Cancer Staging   Staging form: Breast, AJCC 8th Edition - Pathologic stage from 09/14/2018: No Stage Recommended (ypT0, pN0, cM0, GX, ER: Not Assessed, PR: Not Assessed, HER2: Not Assessed) - Signed by Truitt Merle, MD on 09/23/2018   11/03/2018 - 12/10/2018 Radiation Therapy   Adjuvant Radiation with Dr. Isidore Moos    12/27/2018 -  Anti-estrogen oral therapy   Start Tamoxifen 50m daily in 12/27/2018 stopped after 1 week due to poor toleration. She started anastrozole on 02/04/19 but stopped on 02/14/19 due to worsening muscle aches. She tried Letrozole but did not tolerate. She was switched to Exemestane in  late 04/2019.    01/04/2019 Imaging   CT chest  IMPRESSION: 1. Stable tiny subpleural and perifissural nodules bilaterally. No suspicious pulmonary nodule or mass. 2. No focal airspace disease in the right lung. 3.  Aortic Atherosclerois (ICD10-170.0)     03/08/2019 Imaging   CT AP W Contrast 03/08/19  IMPRESSION: 1. No metastatic  disease in the abdomen pelvis. 2. No evidence adverse immunotherapy therapy reaction.   03/08/2019 Imaging   Whole body bone scan 03/08/19  IMPRESSION: No definite scintigraphic evidence of osseous metastases.   08/29/2019 - 08/29/2020 Chemotherapy   Started Nerlynx 25m daily with budesonide on 08/29/19. Reduced to 2042min late 08/2019. Completed on 08/29/20        INTERVAL HISTORY:  Claire Guzman here for a follow up of breast cancer. She was last seen by me on 08/19/21. She presents to the clinic alone. Her left arm is very bruised from her recent fall. She reports pain to the arm and to her hip and notes the arm pain seems to be getting worse instead of better. She also tells me she is scheduled for surgery for her plantar fasciitis on 04/17/22. She explains she has tried therapies with no relief and is currently on tramadol for the pain. She reports she has not been taking the exemestane. She notes she believes she tried low dose but experienced same arthralgia.   All other systems were reviewed with the patient and are negative.  MEDICAL HISTORY:  Past Medical History:  Diagnosis Date   Abdominal pain, unspecified site    Anxiety state, unspecified    Complication of anesthesia    convulsions - when she has appendectomy about 12 years ago at [facility]   Contact dermatitis and other eczema, due to unspecified cause    Essential hypertension, benign    Family history of colon cancer    Family history of melanoma    Family history of prostate cancer    GERD (gastroesophageal reflux disease)    Hematuria, unspecified    Hypertension    Lower back pain    Mixed hyperlipidemia    Rosacea    rt breast ca dx'd 01/2018   R breast cancer   Unspecified symptom associated with female genital organs     SURGICAL HISTORY: Past Surgical History:  Procedure Laterality Date   BREAST RECONSTRUCTION WITH PLACEMENT OF TISSUE EXPANDER AND ALLODERM Right 09/14/2018   Procedure:  BREAST RECONSTRUCTION WITH PLACEMENT OF TISSUE EXPANDER AND ALLODERM;  Surgeon: ThIrene LimboMD;  [place] [facility] Service: [facility];  Laterality: Right;   CESAREAN SECTION     DIAGNOSTIC LAPAROSCOPY     EYE SURGERY     Lasik   LAPAROSCOPIC APPENDECTOMY     LIPOSUCTION WITH LIPOFILLING Right 07/26/2019   Procedure: LIPOFILLING from abdomen to R chest;  Surgeon: ThIrene LimboMD;  [place] [facility] Service: Plastics;  Laterality: Right;   MASTECTOMY WITH RADIOACTIVE SEED GUIDED EXCISION AND AXILLARY SENTINEL LYMPH NODE BIOPSY Right 09/14/2018   Procedure: RIGHT NIPPLE SPARING MASTECTOMY WITH TARGETED DEEP RIGHT AXILLARY LYMPH NODE BIOPSY WITH RADIOACTIVE SEED LOCALIZATION AND RIGHT AXILLARY SENTINEL LYMPH NODE BIOPSY, FROZEN SECTION AND POSSIBLE COMPLETION AXILLARY LYMPH NODE DISSECTION INJECT BLUE DYE RIGHT BREAST;  Surgeon: InFanny SkatesMD;  [place] [facility]: [place];  Laterality: Right;   PORT-A-CATH REMOVAL Left 07/26/2019   Procedure: REMOVAL PORT-A-CATH;  Surgeon: ThIrene LimboMD;  [place] [facility] [place]: [place];  Laterality: Left;   PORTACATH PLACEMENT Left 04/13/2018   Procedure: INSERTION PORT-A-CATH WITH [place];  Surgeon: Fanny Skates, MD;  Location: [place];  Service: General;  Laterality: Left;   REMOVAL OF TISSUE EXPANDER AND PLACEMENT OF IMPLANT Right 07/26/2019   Procedure: REMOVAL OF TISSUE EXPANDER AND PLACEMENT OF IMPLANT;  Surgeon: Irene Limbo, MD;  Location: [place];  Service: Plastics;  Laterality: Right;   UTERINE FIBROID SURGERY      I have reviewed the social history and family history with the patient and they are unchanged from previous note.  ALLERGIES:  is allergic to metrogel [metronidazole], other, paxil [paroxetine hcl], sulfamethoxazole, zomig [zolmitriptan], and prednisone.  MEDICATIONS:  Current Outpatient Medications  Medication Sig Dispense  Refill   traZODone (DESYREL) 50 MG tablet Take 1 tablet (50 mg total) by mouth at bedtime as needed for sleep. 30 tablet 0   ALPRAZolam (XANAX) 0.5 MG tablet TAKE 1 TABLET(0.5 MG) BY MOUTH TWICE DAILY AS NEEDED FOR ANXIETY 30 tablet 0   atenolol (TENORMIN) 50 MG tablet Take 50 mg by mouth at bedtime.     Cholecalciferol (VITAMIN D3) 50 MCG (2000 UT) capsule Take 2,000 Units by mouth daily.     dexamethasone (DECADRON) 4 MG tablet Take 1 tablet (4 mg total) by mouth daily. 7 tablet 0   exemestane (AROMASIN) 25 MG tablet TAKE 1 TABLET(25 MG) BY MOUTH DAILY AFTER BREAKFAST 90 tablet 1   ibuprofen (ADVIL) 800 MG tablet Take 1 tablet (800 mg total) by mouth every 8 (eight) hours as needed. 20 tablet 0   lactulose (CHRONULAC) 10 GM/15ML solution TAKE 20 MLS BY MOUTH EVERY 4 HOURS AS NEEDED FOR CONSTIPATION 237 mL 0   methocarbamol (ROBAXIN) 750 MG tablet Take 1 tablet (750 mg total) by mouth 3 (three) times daily as needed (muscle spasm/pain). 15 tablet 0   methylPREDNISolone (MEDROL DOSEPAK) 4 MG TBPK tablet Take as directed 21 tablet 0   naproxen (NAPROSYN) 500 MG tablet TAKE 1 TABLET(500 MG) BY MOUTH TWICE DAILY WITH A MEAL 30 tablet 0   potassium chloride SA (KLOR-CON M) 20 MEQ tablet Take 1 tablet (20 mEq total) by mouth 2 (two) times daily. 14 tablet 0   traMADol (ULTRAM) 50 MG tablet TAKE 1 TABLET BY MOUTH EVERY 12 HOURS AS NEEDED 15 tablet 0   valACYclovir (VALTREX) 500 MG tablet valacyclovir 500 mg tablet  TAKE 1 TABLET BY MOUTH TWICE DAILY     venlafaxine XR (EFFEXOR-XR) 150 MG 24 hr capsule TAKE 1 CAPSULE(150 MG) BY MOUTH DAILY WITH BREAKFAST 30 capsule 5   Zinc 50 MG TABS Take 1 tablet by mouth daily.     No current facility-administered medications for this visit.    PHYSICAL EXAMINATION: ECOG PERFORMANCE STATUS: 1 - Symptomatic but completely ambulatory  Vitals:   02/17/22 0928  BP: 134/77  Pulse: 60  Resp: 15  Temp: 98.3 F (36.8 C)  SpO2: 99%   Wt Readings from Last 3  Encounters:  02/17/22 167 lb 6.4 oz (75.9 kg)  08/19/21 164 lb 14.4 oz (74.8 kg)  05/14/21 168 lb (76.2 kg)     GENERAL:alert, no distress and comfortable SKIN: skin color, texture, turgor are normal, no rashes or significant lesions except extensive ecchymosis and an egg size hematoma on left arm. EYES: normal, Conjunctiva are pink and non-injected, sclera clear  NECK: supple, thyroid normal size, non-tender, without nodularity LYMPH:  no palpable lymphadenopathy in the cervical, axillary LUNGS: clear to auscultation and  percussion with normal breathing effort HEART: regular rate & rhythm and no murmurs and no lower extremity edema ABDOMEN:abdomen soft, non-tender and normal bowel sounds Musculoskeletal:no cyanosis of digits and no clubbing  NEURO: alert & oriented x 3 with fluent speech, no focal motor/sensory deficits BREAST: No palpable mass, nodules or adenopathy bilaterally. Breast exam benign.   LABORATORY DATA:  I have reviewed the data as listed    Latest Ref Rng & Units 02/17/2022    9:10 AM 09/19/2021    8:45 AM 08/19/2021    9:24 AM  CBC  WBC 4.0 - 10.5 K/uL 5.2  4.1  5.4   Hemoglobin 12.0 - 15.0 g/dL 12.9  12.9  13.2   Hematocrit 36.0 - 46.0 % 35.5  36.6  36.8   Platelets 150 - 400 K/uL 179  188  164         Latest Ref Rng & Units 02/17/2022    9:10 AM 09/19/2021    8:45 AM 08/19/2021    9:24 AM  CMP  Glucose 70 - 99 mg/dL 102  92  97   BUN 6 - 20 mg/dL _0 Creatinine 0.44 - 1.00 mg/dL 0.68  0.58  0.58   Sodium 135 - 145 mmol/L 141  140  137   Potassium 3.5 - 5.1 mmol/L 3.8  3.6  3.0   Chloride 98 - 111 mmol/L 106  104  102   CO2 22 - 32 mmol/L _1 Calcium 8.9 - 10.3 mg/dL 9.7  9.5  9.2   Total Protein 6.5 - 8.1 g/dL 7.2  7.5  7.9   Total Bilirubin 0.3 - 1.2 mg/dL 0.8  0.6  0.7   Alkaline Phos 38 - 126 U/L 87  90  85   AST 15 - 41 U/L _2 ALT 0 - 44 U/L _3 RADIOGRAPHIC STUDIES: I have personally reviewed the  radiological images as listed and agreed with the findings in the report. No results found.    Orders Placed This Encounter  Procedures   MM Digital Screening Unilat L    Standing Status:   Future    Standing Expiration Date:   02/17/2023    Order Specific Question:   Reason for Exam (SYMPTOM  OR DIAGNOSIS REQUIRED)    Answer:   screening    Order Specific Question:   Is the patient pregnant?    Answer:   No    Order Specific Question:   Preferred imaging location?    Answer:   [facility]   All questions were answered. The patient knows to call the clinic with any problems, questions or concerns. No barriers to learning was detected. The total time spent in the appointment was 30 minutes.     Truitt Merle, MD 02/17/2022   I, Wilburn Mylar, am acting as scribe for Truitt Merle, MD.   I have reviewed the above documentation for accuracy and completeness, and I agree with the above.

## 2022-02-18 ENCOUNTER — Other Ambulatory Visit: Payer: Self-pay | Admitting: Hematology

## 2022-02-18 DIAGNOSIS — F419 Anxiety disorder, unspecified: Secondary | ICD-10-CM

## 2022-02-18 DIAGNOSIS — Z170 Estrogen receptor positive status [ER+]: Secondary | ICD-10-CM

## 2022-02-18 LAB — CANCER ANTIGEN 27.29: CA 27.29: 28 U/mL (ref 0.0–38.6)

## 2022-02-19 ENCOUNTER — Telehealth: Payer: Self-pay | Admitting: Hematology

## 2022-02-19 NOTE — Telephone Encounter (Signed)
Scheduled follow-up appointment per 7/24 los. Patient is aware. 

## 2022-02-20 ENCOUNTER — Telehealth: Payer: Self-pay

## 2022-02-20 NOTE — Telephone Encounter (Signed)
Pt called stating she had allergic reaction to the Trazodone.  Pt stated she had nasal congestion, swelling of her face and feet, and increased urination.  Pt denied SOB, rash, nor itching.  Pt did c/o acid reflux at time of call.  Instructed pt to take Claritin and Pepcid OTC for the next 4 days.  Pt stated her symptoms were getting better today.  Pt stated the reaction happened about 1 day ago.  Asked pt if she would like to be seen in [facility] on tomorrow.  Pt stated she thinks she fine and doesn't want to come in.  Informed pt if her symptoms does not get better to please contact Dr. Ernestina Penna office so we can get her in for further assessment.  Pt verbalized understanding of instructions and stated she will start taking the Claritin and Pepcid today.  Discontinued the pt's Trazodone and added it to the pt's allergy list.  Pt stated she' taking Xanax for sleep which is working.  Notified Dr. Burr Medico of conversation with pt.

## 2022-02-24 ENCOUNTER — Other Ambulatory Visit: Payer: Self-pay | Admitting: Hematology

## 2022-02-24 DIAGNOSIS — F419 Anxiety disorder, unspecified: Secondary | ICD-10-CM

## 2022-02-24 DIAGNOSIS — C50211 Malignant neoplasm of upper-inner quadrant of right female breast: Secondary | ICD-10-CM

## 2022-04-17 HISTORY — PX: PLANTAR FASCIA SURGERY: SHX746
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
## 2022-05-06 ENCOUNTER — Ambulatory Visit (INDEPENDENT_AMBULATORY_CARE_PROVIDER_SITE_OTHER): Payer: Medicaid Other

## 2022-05-06 ENCOUNTER — Encounter: Payer: Self-pay | Admitting: Rheumatology

## 2022-05-06 ENCOUNTER — Ambulatory Visit: Payer: Medicaid Other | Attending: Rheumatology | Admitting: Rheumatology

## 2022-05-06 ENCOUNTER — Ambulatory Visit: Payer: Medicaid Other

## 2022-05-06 VITALS — BP 141/86 | HR 67 | Resp 15 | Ht 64.5 in | Wt 169.4 lb

## 2022-05-06 DIAGNOSIS — Z808 Family history of malignant neoplasm of other organs or systems: Secondary | ICD-10-CM

## 2022-05-06 DIAGNOSIS — G8929 Other chronic pain: Secondary | ICD-10-CM

## 2022-05-06 DIAGNOSIS — M79641 Pain in right hand: Secondary | ICD-10-CM | POA: Diagnosis not present

## 2022-05-06 DIAGNOSIS — C50211 Malignant neoplasm of upper-inner quadrant of right female breast: Secondary | ICD-10-CM

## 2022-05-06 DIAGNOSIS — M542 Cervicalgia: Secondary | ICD-10-CM

## 2022-05-06 DIAGNOSIS — M130 Polyarthritis, unspecified: Secondary | ICD-10-CM

## 2022-05-06 DIAGNOSIS — Z9011 Acquired absence of right breast and nipple: Secondary | ICD-10-CM

## 2022-05-06 DIAGNOSIS — M79642 Pain in left hand: Secondary | ICD-10-CM | POA: Diagnosis not present

## 2022-05-06 DIAGNOSIS — Z95828 Presence of other vascular implants and grafts: Secondary | ICD-10-CM

## 2022-05-06 DIAGNOSIS — M7732 Calcaneal spur, left foot: Secondary | ICD-10-CM

## 2022-05-06 DIAGNOSIS — C44519 Basal cell carcinoma of skin of other part of trunk: Secondary | ICD-10-CM

## 2022-05-06 DIAGNOSIS — M25561 Pain in right knee: Secondary | ICD-10-CM | POA: Diagnosis not present

## 2022-05-06 DIAGNOSIS — M722 Plantar fascial fibromatosis: Secondary | ICD-10-CM

## 2022-05-06 DIAGNOSIS — M25562 Pain in left knee: Secondary | ICD-10-CM

## 2022-05-06 DIAGNOSIS — M4156 Other secondary scoliosis, lumbar region: Secondary | ICD-10-CM

## 2022-05-06 DIAGNOSIS — Z8042 Family history of malignant neoplasm of prostate: Secondary | ICD-10-CM

## 2022-05-06 DIAGNOSIS — M51369 Other intervertebral disc degeneration, lumbar region without mention of lumbar back pain or lower extremity pain: Secondary | ICD-10-CM

## 2022-05-06 DIAGNOSIS — Z800 Family history of malignant neoplasm of digestive organs: Secondary | ICD-10-CM

## 2022-05-06 DIAGNOSIS — Z170 Estrogen receptor positive status [ER+]: Secondary | ICD-10-CM

## 2022-05-06 DIAGNOSIS — M5136 Other intervertebral disc degeneration, lumbar region: Secondary | ICD-10-CM

## 2022-05-06 DIAGNOSIS — R768 Other specified abnormal immunological findings in serum: Secondary | ICD-10-CM

## 2022-05-06 DIAGNOSIS — M810 Age-related osteoporosis without current pathological fracture: Secondary | ICD-10-CM

## 2022-05-09 ENCOUNTER — Telehealth: Payer: Self-pay | Admitting: *Deleted

## 2022-05-09 DIAGNOSIS — R768 Other specified abnormal immunological findings in serum: Secondary | ICD-10-CM

## 2022-05-09 DIAGNOSIS — M79641 Pain in right hand: Secondary | ICD-10-CM

## 2022-05-09 DIAGNOSIS — G8929 Other chronic pain: Secondary | ICD-10-CM

## 2022-05-09 DIAGNOSIS — M130 Polyarthritis, unspecified: Secondary | ICD-10-CM

## 2022-05-09 NOTE — Progress Notes (Signed)
Positive ANA, positive double-stranded DNA, all other serology negative.  Please add G6PD, beta-2 GP 1 and anticardiolipin antibodies if possible.  I will discuss results at the follow-up visit.

## 2022-05-09 NOTE — Telephone Encounter (Signed)
-----   Message from Bo Merino, MD sent at 05/09/2022  8:04 AM EDT ----- Positive ANA, positive double-stranded DNA, all other serology negative.  Please add G6PD, beta-2 GP 1 and anticardiolipin antibodies if possible.  I will discuss results at the follow-up visit.

## 2022-05-12 ENCOUNTER — Ambulatory Visit: Payer: Medicaid Other

## 2022-05-15 ENCOUNTER — Other Ambulatory Visit: Payer: Self-pay | Admitting: Hematology

## 2022-05-15 DIAGNOSIS — C50211 Malignant neoplasm of upper-inner quadrant of right female breast: Secondary | ICD-10-CM

## 2022-05-15 LAB — ANTI-NUCLEAR AB-TITER (ANA TITER)
ANA TITER: 1:1280 {titer} — ABNORMAL HIGH
ANA Titer 1: 1:1280 {titer} — ABNORMAL HIGH

## 2022-05-15 LAB — ANTI-DNA ANTIBODY, DOUBLE-STRANDED: ds DNA Ab: 107 IU/mL — ABNORMAL HIGH

## 2022-05-15 LAB — RNP ANTIBODY: Ribonucleic Protein(ENA) Antibody, IgG: <1 AI

## 2022-05-15 LAB — C3 AND C4
C3 Complement: 169 mg/dL (ref 83–193)
C4 Complement: 24 mg/dL (ref 15–57)

## 2022-05-15 LAB — TEST AUTHORIZATION

## 2022-05-15 LAB — CARDIOLIPIN ANTIBODIES, IGG, IGM, IGA
Anticardiolipin IgA: 2.3 APL-U/mL (ref <20.0)
Anticardiolipin IgG: <2 GPL-U/mL (ref <20.0)
Anticardiolipin IgM: 4.1 MPL-U/mL (ref <20.0)

## 2022-05-15 LAB — ANTI-SMITH ANTIBODY: ENA SM Ab Ser-aCnc: <1 AI

## 2022-05-15 LAB — RHEUMATOID FACTOR: Rheumatoid fact SerPl-aCnc: <14 IU/mL (ref <14)

## 2022-05-15 LAB — URIC ACID: Uric Acid, Serum: 5 mg/dL (ref 2.5–7.0)

## 2022-05-15 LAB — BETA-2 GLYCOPROTEIN ANTIBODIES
Beta-2 Glyco 1 IgA: <2 U/mL (ref <20.0)
Beta-2 Glyco 1 IgM: 5.4 U/mL (ref <20.0)
Beta-2 Glyco I IgG: <2 U/mL (ref <20.0)

## 2022-05-15 LAB — SEDIMENTATION RATE: Sed Rate: 17 mm/h (ref 0–30)

## 2022-05-15 LAB — ANA: Anti Nuclear Antibody (ANA): POSITIVE — AB

## 2022-05-15 LAB — SJOGRENS SYNDROME-A EXTRACTABLE NUCLEAR ANTIBODY: SSA (Ro) (ENA) Antibody, IgG: <1 AI

## 2022-05-15 LAB — SJOGRENS SYNDROME-B EXTRACTABLE NUCLEAR ANTIBODY: SSB (La) (ENA) Antibody, IgG: <1 AI

## 2022-05-15 LAB — CYCLIC CITRUL PEPTIDE ANTIBODY, IGG: Cyclic Citrullin Peptide Ab: <16 UNITS

## 2022-05-16 ENCOUNTER — Other Ambulatory Visit: Payer: Self-pay

## 2022-05-18 NOTE — Progress Notes (Signed)
Office Visit Note  Patient: Claire Guzman             Date of Birth: 22-Aug-1965           MRN: [id number]             PCP: Lujean Amel, MD Referring: Lujean Amel, MD Visit Date: 05/27/2022 Occupation: @GUAROCC @  Subjective:  Pain in multiple joints   History of Present Illness: Claire Guzman is a 56 y.o. female with positive ANA, positive double-stranded DNA.  She states she continues to have some dry mouth and pain in multiple joints.  She describes pain and discomfort in her bilateral shoulders, bilateral elbows, bilateral hands.  The most prominent discomfort is in her right shoulder.  She also has discomfort in her lower back, hips, knees and her feet.  She had left foot plantar fascia surgery which is a started healing.  She continues to have difficulty walking due to pain and discomfort over her metatarsals.  She has not noticed any joint swelling.  Activities of Daily Living:  Patient reports morning stiffness for several hours.   Patient Reports nocturnal pain.  Difficulty dressing/grooming: Denies Difficulty climbing stairs: Denies Difficulty getting out of chair: Denies Difficulty using hands for taps, buttons, cutlery, and/or writing: Denies  Review of Systems  Constitutional:  Positive for fatigue.  HENT:  Positive for mouth dryness. Negative for mouth sores.   Eyes:  Positive for dryness.  Respiratory:  Negative for shortness of breath.   Cardiovascular:  Negative for chest pain and palpitations.  Gastrointestinal:  Positive for constipation. Negative for blood in stool and diarrhea.  Endocrine: Negative for increased urination.  Genitourinary:  Negative for involuntary urination.  Musculoskeletal:  Positive for joint pain, joint pain, joint swelling and morning stiffness. Negative for gait problem, myalgias, muscle weakness, muscle tenderness and myalgias.  Skin:  Positive for sensitivity to sunlight. Negative for color change, rash and hair  loss.  Allergic/Immunologic: Negative for susceptible to infections.  Neurological:  Negative for dizziness and headaches.  Hematological:  Negative for swollen glands.  Psychiatric/Behavioral:  Negative for depressed mood and sleep disturbance. The patient is nervous/anxious.     PMFS History:  Patient Active Problem List   Diagnosis Date Noted   Plantar fasciitis 12/12/2021   Heel spur, left 12/12/2021   Headache, acute 08/08/2021   Low back pain 05/14/2021   Acquired absence of right breast 09/22/2018   Port-A-Cath in place 05/11/2018   Genetic testing 04/12/2018   Family history of prostate cancer    Family history of colon cancer    Family history of melanoma    Malignant neoplasm of upper-inner quadrant of right breast in female, estrogen receptor positive ([place]) 03/10/2018    Past Medical History:  Diagnosis Date   Abdominal pain, unspecified site    Anxiety state, unspecified    Complication of anesthesia    convulsions - when she has appendectomy about 12 years ago at [facility]   Contact dermatitis and other eczema, due to unspecified cause    Essential hypertension, benign    Family history of colon cancer    Family history of melanoma    Family history of prostate cancer    GERD (gastroesophageal reflux disease)    Hematuria, unspecified    Hypertension    Lower back pain    Mixed hyperlipidemia    Rosacea    rt breast ca dx'd 01/2018   R breast cancer   Unspecified symptom associated  with female genital organs     Family History  Problem Relation Age of Onset   Heart failure Mother    Melanoma Mother 50       has had several removed over the past ten years   Atrial fibrillation Mother    Prostate cancer Father 52   Stroke Father    Throat cancer Father    Heart Problems Maternal Aunt        d.60   Heart Problems Maternal Uncle        d.60   Colon cancer Paternal Aunt        dx 71s   Colon cancer Paternal Uncle        dx 24s   Heart attack  Maternal Grandmother    Heart attack Maternal Grandfather        possible cancer, unknown type   Stroke Paternal Grandmother    Colon cancer Paternal Grandfather        dx upper 38s   Healthy Daughter    Past Surgical History:  Procedure Laterality Date   BREAST RECONSTRUCTION WITH PLACEMENT OF TISSUE EXPANDER AND ALLODERM Right 09/14/2018   Procedure: BREAST RECONSTRUCTION WITH PLACEMENT OF TISSUE EXPANDER AND ALLODERM;  Surgeon: Irene Limbo, MD;  Location: [place];  Service: Plastics;  Laterality: Right;   CESAREAN SECTION     DIAGNOSTIC LAPAROSCOPY     EYE SURGERY     Lasik   LAPAROSCOPIC APPENDECTOMY     LIPOSUCTION WITH LIPOFILLING Right 07/26/2019   Procedure: LIPOFILLING from abdomen to R chest;  Surgeon: Irene Limbo, MD;  Location: [place];  Service: Plastics;  Laterality: Right;   MASTECTOMY WITH RADIOACTIVE SEED GUIDED EXCISION AND AXILLARY SENTINEL LYMPH NODE BIOPSY Right 09/14/2018   Procedure: RIGHT NIPPLE SPARING MASTECTOMY WITH TARGETED DEEP RIGHT AXILLARY LYMPH NODE BIOPSY WITH RADIOACTIVE SEED LOCALIZATION AND RIGHT AXILLARY SENTINEL LYMPH NODE BIOPSY, FROZEN SECTION AND POSSIBLE COMPLETION AXILLARY LYMPH NODE DISSECTION INJECT BLUE DYE RIGHT BREAST;  Surgeon: Fanny Skates, MD;  Location: [place];  Service: General;  Laterality: Right;   PLANTAR FASCIA SURGERY Left 04/17/2022   PORT-A-CATH REMOVAL Left 07/26/2019   Procedure: REMOVAL PORT-A-CATH;  Surgeon: Irene Limbo, MD;  Location: [place];  Service: Plastics;  Laterality: Left;   PORTACATH PLACEMENT Left 04/13/2018   Procedure: INSERTION PORT-A-CATH WITH [place];  Surgeon: Fanny Skates, MD;  Location: [place];  Service: General;  Laterality: Left;   REMOVAL OF TISSUE EXPANDER AND PLACEMENT OF IMPLANT Right 07/26/2019   Procedure: REMOVAL OF TISSUE EXPANDER AND PLACEMENT OF IMPLANT;  Surgeon: Irene Limbo, MD;  Location: [place];   Service: Plastics;  Laterality: Right;   UTERINE FIBROID SURGERY     Social History   Social History Narrative   Not on file   Immunization History  Administered Date(s) Administered   Influenza,inj,Quad PF,6+ Mos 05/11/2018, 05/16/2019, 05/04/2020   Tdap 02/06/2022     Objective: Vital Signs: BP 124/76 (BP Location: Left Arm, Patient Position: Sitting, Cuff Size: Normal)   Pulse 61   Resp 14   Ht 5' 4"  (1.626 m)   Wt 167 lb 12.8 oz (76.1 kg)   LMP 03/28/2018 (Within Days)   BMI 28.80 kg/m    Physical Exam Vitals and nursing note reviewed.  Constitutional:      Appearance: She is well-developed.  HENT:     Head: Normocephalic and atraumatic.  Eyes:     Conjunctiva/sclera: Conjunctivae normal.  Cardiovascular:  Rate and Rhythm: Normal rate and regular rhythm.     Heart sounds: Normal heart sounds.  Pulmonary:     Effort: Pulmonary effort is normal.     Breath sounds: Normal breath sounds.  Abdominal:     General: Bowel sounds are normal.     Palpations: Abdomen is soft.  Musculoskeletal:     Cervical back: Normal range of motion.  Lymphadenopathy:     Cervical: No cervical adenopathy.  Skin:    General: Skin is warm and dry.     Capillary Refill: Capillary refill takes less than 2 seconds.  Neurological:     Mental Status: She is alert and oriented to person, place, and time.  Psychiatric:        Behavior: Behavior normal.      Musculoskeletal Exam: She had good range of motion of her cervical spine with some discomfort.  Shoulder joints, elbow joints, wrist joints with good range of motion.  She had bilateral PIP and DIP thickening with no synovitis.  Hip joints and knee joints with good range of motion.  She had tenderness across her MTPs.  She has some tenderness over plantar fascia.  The surgical scar is healing well.  CDAI Exam: CDAI Score: -- Patient Global: --; Provider Global: -- Swollen: --; Tender: -- Joint Exam 05/27/2022   No joint exam has  been documented for this visit   There is currently no information documented on the homunculus. Go to the Rheumatology activity and complete the homunculus joint exam.  Investigation: No additional findings.  Imaging: XR Hand 2 View Left  Result Date: 05/06/2022 CMC, PIP and DIP narrowing was noted.  No MCP, intercarpal or radiocarpal joint space narrowing was noted.  No erosive changes were noted. Impression: These findings are consistent with osteoarthritis of the hand.  XR Hand 2 View Right  Result Date: 05/06/2022 CMC, PIP and DIP narrowing was noted.  No MCP, intercarpal or radiocarpal joint space narrowing was noted.  No erosive changes were noted. Impression: These findings are consistent with osteoarthritis of the hand.  XR KNEE 3 VIEW LEFT  Result Date: 05/06/2022 No medial or lateral compartment narrowing was noted.  No patellofemoral narrowing was noted.  No chondrocalcinosis was noted. Impression: Unremarkable x-rays of the knee.  XR KNEE 3 VIEW RIGHT  Result Date: 05/06/2022 No medial or lateral compartment narrowing was noted.  Mild patellofemoral narrowing was noted. Impression: These findings are consistent with mild chondromalacia patella of the knee.  XR Cervical Spine 2 or 3 views  Result Date: 05/06/2022 No significant disc space narrowing was noted.  Facet joint narrowing was noted.  Loss of lumbar lordosis was noted. Impression: These findings are consistent with facet joint arthropathy.   Recent Labs: Lab Results  Component Value Date   WBC 5.2 02/17/2022   HGB 12.9 02/17/2022   PLT 179 02/17/2022   NA 141 02/17/2022   K 3.8 02/17/2022   CL 106 02/17/2022   CO2 28 02/17/2022   GLUCOSE 102 (H) 02/17/2022   BUN 13 02/17/2022   CREATININE 0.68 02/17/2022   BILITOT 0.8 02/17/2022   ALKPHOS 87 02/17/2022   AST 17 02/17/2022   ALT 13 02/17/2022   PROT 7.2 02/17/2022   ALBUMIN 4.5 02/17/2022   CALCIUM 9.7 02/17/2022   GFRAA >60 03/02/2020    May 06, 2022 ANA 1: 1280 NS, dsDNA 107, (Smith, RNP, SSA, SSB negative), C3-C4 normal, beta-2 GP 1 negative, anticardiolipin negative, ESR 17, RF negative, anti-CCP negative, uric  acid 5.0  10/31/21:ANA+, RF 14.4, ESR 3, CRP 2   Speciality Comments: No specialty comments available.  Procedures:  No procedures performed Allergies: Trazodone and nefazodone, Metrogel [metronidazole], Other, Paxil [paroxetine hcl], Sulfamethoxazole, Zomig [zolmitriptan], and Prednisone   Assessment / Plan:     Visit Diagnoses: Undifferentiated connective tissue disease ([place]) - Positive ANA, positive double-stranded DNA, dry mouth, polyarthralgia.  No history of oral ulcers, nasal ulcers, malar rash, photosensitivity, Raynaud's, lymphadenopathy.  She continues to have pain and discomfort in her bilateral feet and has difficulty walking.  She also has discomfort in multiple other joints.  No synovitis was noted.  Detailed counseling regarding positive ANA and double-stranded DNA was provided.  She would like to have labs repeated.  I will repeat labs today.  I will also obtain urine protein creatinine ratio.- Plan: ANA, Anti-DNA antibody, double-stranded, Protein / creatinine ratio, urine.  Possibility of undifferentiated connective tissue disease was discussed.  Different treatment options and their side effects were discussed.  We discussed the option of starting her on hydroxychloroquine 200 mg twice daily Monday to Friday.  Patient wants to proceed with hydroxychloroquine.  A handout was given and consent was taken.  We will send the prescription for hydroxychloroquine 200 mg p.o. twice daily Monday to Friday.  She was advised to get baseline eye examination and then yearly eye examination.  Patient was counseled on the purpose, proper use, and adverse effects of hydroxychloroquine including nausea/diarrhea, skin rash, headaches, and sun sensitivity.  Advised patient to wear sunscreen once starting  hydroxychloroquine to reduce risk of rash associated with sun sensitivity.  Discussed importance of annual eye exams while on hydroxychloroquine to monitor to ocular toxicity and discussed importance of frequent laboratory monitoring.  Provided patient with eye exam form for baseline ophthalmologic exam.  Reviewed risk for QTC prolongation when used in combination with other QTc prolonging agents (including but not limited to antiarrhythmics, macrolide antibiotics, flouroquinolones, tricyclic antidepressants, citalopram, specific antipsychotics, ondansetron, migraine triptans, and methadone). Provided patient with educational materials on hydroxychloroquine and answered all questions.  Patient consented to hydroxychloroquine. Will upload consent in the media tab.     High risk medication use - Plan: Glucose 6 phosphate dehydrogenase today.  She was advised to get labs in 1 month, 3 months and then every 5 months to monitor medication.  Information regarding immunization was placed in the AVS.  Primary osteoarthritis of both hands -she continues to have pain and discomfort in her bilateral hands.  No synovitis was noted.  X-ray findings were discussed with the patient.  X-rays consistent with osteoarthritis.  Repeat rheumatoid factor negative.  Anti-CCP negative.  Chondromalacia patellae, right knee - History of pain in bilateral knee joints.  No swelling notedChondromalacia patella of the right knee joint was noted.  Left knee joint x-rays were unremarkable.  X-ray findings were discussed with the patient.  Plantar fasciitis - Recent surgery for [place] fasciitis by Dr. Jacqualyn Posey on April 17, 2022.  The surgical scar is healing well.  She continues to have plantar fascia tenderness.  She also has discomfort over bilateral MTP joints.  Arthropathy of cervical facet joint - History of chronic neck pain for which she goes to chiropractor and had acupuncture.  X-rays showed facet joint arthropathy.  X-ray  findings were discussed with the patient.  Neck stretching exercises were discussed.  Other secondary scoliosis, lumbar region-chronic discomfort.  DDD (degenerative disc disease), lumbar - History of lower back pain for many years.  MRI in 2022 showed  multilevel degenerative disc disease.  Age-related osteoporosis without current pathological fracture - On Zometa infusions by her oncologist.  Last infusion September 19, 2021.  Malignant neoplasm of upper-inner quadrant of right breast in female, estrogen receptor positive ([place]) - Diagnosed in 2019.  Status postmastectomy, chemotherapy and radiation therapy.  Acquired absence of right breast  Basal cell carcinoma (BCC) of skin of other part of torso  Family history of colon cancer-paternal grandfather, paternal aunt, paternal uncle  Family history of melanoma-mother  Family history of prostate cancer-father  Orders: Orders Placed This Encounter  Procedures   ANA   Anti-DNA antibody, double-stranded   Protein / creatinine ratio, urine   Glucose 6 phosphate dehydrogenase   Ambulatory referral to Ophthalmology   Meds ordered this encounter  Medications   hydroxychloroquine (PLAQUENIL) 200 MG tablet    Sig: Take 1 tablet 200 mg BID Monday-Friday    Dispense:  120 tablet    Refill:  0     Follow-Up Instructions: Return in about 2 months (around 07/27/2022) for UCTD.   Bo Merino, MD  Note - This record has been created using Editor, commissioning.  Chart creation errors have been sought, but may not always  have been located. Such creation errors do not reflect on  the standard of medical care.

## 2022-05-27 ENCOUNTER — Ambulatory Visit: Payer: Medicaid Other | Attending: Rheumatology | Admitting: Rheumatology

## 2022-05-27 ENCOUNTER — Encounter: Payer: Self-pay | Admitting: Rheumatology

## 2022-05-27 VITALS — BP 124/76 | HR 61 | Resp 14 | Ht 64.0 in | Wt 167.8 lb

## 2022-05-27 DIAGNOSIS — M19041 Primary osteoarthritis, right hand: Secondary | ICD-10-CM

## 2022-05-27 DIAGNOSIS — C50211 Malignant neoplasm of upper-inner quadrant of right female breast: Secondary | ICD-10-CM

## 2022-05-27 DIAGNOSIS — Z800 Family history of malignant neoplasm of digestive organs: Secondary | ICD-10-CM

## 2022-05-27 DIAGNOSIS — M2241 Chondromalacia patellae, right knee: Secondary | ICD-10-CM

## 2022-05-27 DIAGNOSIS — Z8042 Family history of malignant neoplasm of prostate: Secondary | ICD-10-CM

## 2022-05-27 DIAGNOSIS — Z170 Estrogen receptor positive status [ER+]: Secondary | ICD-10-CM

## 2022-05-27 DIAGNOSIS — R768 Other specified abnormal immunological findings in serum: Secondary | ICD-10-CM

## 2022-05-27 DIAGNOSIS — M5136 Other intervertebral disc degeneration, lumbar region: Secondary | ICD-10-CM

## 2022-05-27 DIAGNOSIS — Z9011 Acquired absence of right breast and nipple: Secondary | ICD-10-CM

## 2022-05-27 DIAGNOSIS — M19042 Primary osteoarthritis, left hand: Secondary | ICD-10-CM

## 2022-05-27 DIAGNOSIS — M359 Systemic involvement of connective tissue, unspecified: Secondary | ICD-10-CM

## 2022-05-27 DIAGNOSIS — C44519 Basal cell carcinoma of skin of other part of trunk: Secondary | ICD-10-CM

## 2022-05-27 DIAGNOSIS — M722 Plantar fascial fibromatosis: Secondary | ICD-10-CM

## 2022-05-27 DIAGNOSIS — M51369 Other intervertebral disc degeneration, lumbar region without mention of lumbar back pain or lower extremity pain: Secondary | ICD-10-CM

## 2022-05-27 DIAGNOSIS — M810 Age-related osteoporosis without current pathological fracture: Secondary | ICD-10-CM

## 2022-05-27 DIAGNOSIS — Z79899 Other long term (current) drug therapy: Secondary | ICD-10-CM | POA: Diagnosis not present

## 2022-05-27 DIAGNOSIS — Z808 Family history of malignant neoplasm of other organs or systems: Secondary | ICD-10-CM

## 2022-05-27 DIAGNOSIS — M4156 Other secondary scoliosis, lumbar region: Secondary | ICD-10-CM

## 2022-05-27 DIAGNOSIS — M47812 Spondylosis without myelopathy or radiculopathy, cervical region: Secondary | ICD-10-CM

## 2022-05-27 MED ORDER — HYDROXYCHLOROQUINE SULFATE 200 MG PO TABS: ORAL_TABLET | ORAL | 0 refills | Status: DC

## 2022-05-27 NOTE — Patient Instructions (Signed)
Hydroxychloroquine Tablets What is this medication? HYDROXYCHLOROQUINE (hye drox ee KLOR oh kwin) treats autoimmune conditions, such as rheumatoid arthritis and lupus. It works by slowing down an overactive immune system. It may also be used to prevent and treat malaria. It works by killing the parasite that causes malaria. It belongs to a group of medications called DMARDs. This medicine may be used for other purposes; ask your health care provider or pharmacist if you have questions. COMMON BRAND NAME(S): Plaquenil, Quineprox What should I tell my care team before I take this medication? They need to know if you have any of these conditions: Diabetes Eye disease, vision problems Frequently drink alcohol G6PD deficiency Heart disease Irregular heartbeat or rhythm Kidney disease Liver disease Porphyria Psoriasis An unusual or allergic reaction to hydroxychloroquine, other medications, foods, dyes, or preservatives Pregnant or trying to get pregnant Breastfeeding How should I use this medication? Take this medication by mouth with water. Take it as directed on the prescription label. Do not cut, crush, or chew this medication. Swallow the tablets whole. Take it with food. Do not take it more than directed. Take all of this medication unless your care team tells you to stop it early. Keep taking it even if you think you are better. Take products with antacids in them at a different time of day than this medication. Take this medication 4 hours before or 4 hours after antacids. Talk to your care team if you have questions. Talk to your care team about the use of this medication in children. While this medication may be prescribed for selected conditions, precautions do apply. Overdosage: If you think you have taken too much of this medicine contact a poison control center or emergency room at once. NOTE: This medicine is only for you. Do not share this medicine with others. What if I miss a  dose? If you miss a dose, take it as soon as you can. If it is almost time for your next dose, take only that dose. Do not take double or extra doses. What may interact with this medication? Do not take this medication with any of the following: Cisapride Dronedarone Pimozide Thioridazine This medication may also interact with the following: Ampicillin Antacids Cimetidine Cyclosporine Digoxin Kaolin Medications for diabetes, such as insulin, glipizide, glyburide Medications for seizures, such as carbamazepine, phenobarbital, phenytoin Mefloquine Methotrexate Other medications that cause heart rhythm changes Praziquantel This list may not describe all possible interactions. Give your health care provider a list of all the medicines, herbs, non-prescription drugs, or dietary supplements you use. Also tell them if you smoke, drink alcohol, or use illegal drugs. Some items may interact with your medicine. What should I watch for while using this medication? Visit your care team for regular checks on your progress. Tell your care team if your symptoms do not start to get better or if they get worse. You may need blood work done while you are taking this medication. If you take other medications that can affect heart rhythm, you may need more testing. Talk to your care team if you have questions. Your vision may be tested before and during use of this medication. Tell your care team right away if you have any change in your eyesight. This medication may cause serious skin reactions. They can happen weeks to months after starting the medication. Contact your care team right away if you notice fevers or flu-like symptoms with a rash. The rash may be red or purple and then turn   into blisters or peeling of the skin. Or, you might notice a red rash with swelling of the face, lips or lymph nodes in your neck or under your arms. If you or your family notice any changes in your behavior, such as new or  worsening depression, thoughts of harming yourself, anxiety, or other unusual or disturbing thoughts, or memory loss, call your care team right away. What side effects may I notice from receiving this medication? Side effects that you should report to your care team as soon as possible: Allergic reactions--skin rash, itching, hives, swelling of the face, lips, tongue, or throat Aplastic anemia--unusual weakness or fatigue, dizziness, headache, trouble breathing, increased bleeding or bruising Change in vision Heart rhythm changes--fast or irregular heartbeat, dizziness, feeling faint or lightheaded, chest pain, trouble breathing Infection--fever, chills, cough, or sore throat Low blood sugar (hypoglycemia)--tremors or shaking, anxiety, sweating, cold or clammy skin, confusion, dizziness, rapid heartbeat Muscle injury--unusual weakness or fatigue, muscle pain, dark yellow or brown urine, decrease in amount of urine Pain, tingling, or numbness in the hands or feet Rash, fever, and swollen lymph nodes Redness, blistering, peeling, or loosening of the skin, including inside the mouth Thoughts of suicide or self-harm, worsening mood, or feelings of depression Unusual bruising or bleeding Side effects that usually do not require medical attention (report to your care team if they continue or are bothersome): Diarrhea Headache Nausea Stomach pain Vomiting This list may not describe all possible side effects. Call your doctor for medical advice about side effects. You may report side effects to FDA at 1-800-FDA-1088. Where should I keep my medication? Keep out of the reach of children and pets. Store at room temperature up to 30 degrees C (86 degrees F). Protect from light. Get rid of any unused medication after the expiration date. To get rid of medications that are no longer needed or have expired: Take the medication to a medication take-back program. Check with your pharmacy or law enforcement  to find a location. If you cannot return the medication, check the label or package insert to see if the medication should be thrown out in the garbage or flushed down the toilet. If you are not sure, ask your care team. If it is safe to put it in the trash, empty the medication out of the container. Mix the medication with cat litter, dirt, coffee grounds, or other unwanted substance. Seal the mixture in a bag or container. Put it in the trash. NOTE: This sheet is a summary. It may not cover all possible information. If you have questions about this medicine, talk to your doctor, pharmacist, or health care provider.  2023 Elsevier/Gold Standard (2004-09-24 00:00:00)  Vaccines You are taking a medication(s) that can suppress your immune system.  The following immunizations are recommended: Flu annually Covid-19  Td/Tdap (tetanus, diphtheria, pertussis) every 10 years Pneumonia (Prevnar 15 then Pneumovax 23 at least 1 year apart.  Alternatively, can take Prevnar 20 without needing additional dose) Shingrix: 2 doses from 4 weeks to 6 months apart  Please check with your PCP to make sure you are up to date.   Standing Labs We placed an order today for your standing lab work.   Please have your standing labs drawn in 1 month after starting hydroxychloroquine, 3 months and then every 5 months  Please have your labs drawn 2 weeks prior to your appointment so that the provider can discuss your lab results at your appointment.  Please note that you may see  your imaging and lab results in [facility] before we have reviewed them. We will contact you once all results are reviewed. Please allow our office up to 72 hours to thoroughly review all of the results before contacting the office for clarification of your results.  Lab hours are:   Monday through Thursday from 8:00 am -12:30 pm and 1:00 pm-5:00 pm and Friday from 8:00 am-12:00 pm.  Please be advised, all patients with office appointments  requiring lab work will take precedent over walk-in lab work.   Labs are drawn by [facility]. Please bring your co-pay at the time of your lab draw.  You may receive a bill from [facility] for your lab work.  Please note if you are on Hydroxychloroquine and and an order has been placed for a Hydroxychloroquine level, you will need to have it drawn 4 hours or more after your last dose.  If you wish to have your labs drawn at another location, please call the office 24 hours in advance so we can fax the orders.  The office is located at [address], [place], [place], [place] [address] No appointment is necessary.    If you have any questions regarding directions or hours of operation,  please call [phone].   As a reminder, please drink plenty of water prior to coming for your lab work. Thanks!

## 2022-05-27 NOTE — Progress Notes (Signed)
Pharmacy Note  Subjective: Patient presents today to [facility] for follow up office visit.   Patient seen by the pharmacist for counseling on hydroxychloroquine for positive ANA + CTD.    Objective: CMP     Component Value Date/Time   NA 141 02/17/2022 0910   K 3.8 02/17/2022 0910   CL 106 02/17/2022 0910   CO2 28 02/17/2022 0910   GLUCOSE 102 (H) 02/17/2022 0910   BUN 13 02/17/2022 0910   CREATININE 0.68 02/17/2022 0910   CREATININE 0.70 03/18/2021 0917   CALCIUM 9.7 02/17/2022 0910   PROT 7.2 02/17/2022 0910   ALBUMIN 4.5 02/17/2022 0910   AST 17 02/17/2022 0910   AST 17 03/18/2021 0917   ALT 13 02/17/2022 0910   ALT 14 03/18/2021 0917   ALKPHOS 87 02/17/2022 0910   BILITOT 0.8 02/17/2022 0910   BILITOT 0.6 03/18/2021 0917   GFRNONAA >60 02/17/2022 0910   GFRNONAA >60 03/18/2021 0917   GFRAA >60 03/02/2020 1007    CBC    Component Value Date/Time   WBC 5.2 02/17/2022 0910   RBC 3.95 02/17/2022 0910   HGB 12.9 02/17/2022 0910   HGB 13.6 03/18/2021 0917   HCT 35.5 (L) 02/17/2022 0910   PLT 179 02/17/2022 0910   PLT 181 03/18/2021 0917   MCV 89.9 02/17/2022 0910   MCH 32.7 02/17/2022 0910   MCHC 36.3 (H) 02/17/2022 0910   RDW 12.4 02/17/2022 0910   LYMPHSABS 1.9 02/17/2022 0910   MONOABS 0.3 02/17/2022 0910   EOSABS 0.1 02/17/2022 0910   BASOSABS 0.0 02/17/2022 0910    Assessment/Plan: Patient was counseled on the purpose, proper use, and adverse effects of hydroxychloroquine including nausea/diarrhea, skin rash, headaches, and sun sensitivity.  Advised patient to wear sunscreen once starting hydroxychloroquine to reduce risk of rash associated with sun sensitivity.  Discussed importance of annual eye exams while on hydroxychloroquine to monitor to ocular toxicity and discussed importance of frequent laboratory monitoring.  Provided patient with eye exam form for baseline ophthalmologic exam.  Reviewed risk for QTC prolongation when used in  combination with other QTc prolonging agents (including but not limited to antiarrhythmics, macrolide antibiotics, flouroquinolones, tricyclic antidepressants, citalopram, specific antipsychotics, ondansetron, migraine triptans, and methadone). Provided patient with educational materials on hydroxychloroquine and answered all questions.  Patient consented to hydroxychloroquine. Will upload consent in the media tab.    Dose will be Plaquenil 200 mg twice daily Monday through Friday.  Prescription sent to [facility] today. Referral to ophthalmology placed today for yearly eye exams (patient provided with eye exam form)  Knox Saliva, PharmD, MPH, BCPS, CPP Clinical Pharmacist (Rheumatology and Pulmonology)

## 2022-06-01 LAB — ANTI-NUCLEAR AB-TITER (ANA TITER)
ANA TITER: 1:640 {titer} — ABNORMAL HIGH
ANA Titer 1: 1:1280 {titer} — ABNORMAL HIGH

## 2022-06-01 LAB — PROTEIN / CREATININE RATIO, URINE
Creatinine, Urine: 60 mg/dL (ref 20–275)
Protein/Creat Ratio: 67 mg/g creat (ref 24–184)
Protein/Creatinine Ratio: 0.067 mg/mg creat (ref 0.024–0.184)
Total Protein, Urine: 4 mg/dL — ABNORMAL LOW (ref 5–24)

## 2022-06-01 LAB — ANTI-DNA ANTIBODY, DOUBLE-STRANDED: ds DNA Ab: 111 IU/mL — ABNORMAL HIGH

## 2022-06-01 LAB — GLUCOSE 6 PHOSPHATE DEHYDROGENASE: G-6PDH: 4.7 U/g Hgb — ABNORMAL LOW (ref 7.0–20.5)

## 2022-06-01 LAB — ANA: Anti Nuclear Antibody (ANA): POSITIVE — AB

## 2022-06-01 NOTE — Progress Notes (Signed)
Urine protein creatinine ratio normal, ANA positive, and double-stranded DNA positive and high titers.  Patient should continue Plaquenil as prescribed during the last visit.

## 2022-06-16 ENCOUNTER — Other Ambulatory Visit: Payer: Self-pay | Admitting: Hematology

## 2022-06-16 DIAGNOSIS — C50211 Malignant neoplasm of upper-inner quadrant of right female breast: Secondary | ICD-10-CM

## 2022-06-18 NOTE — Telephone Encounter (Signed)
Per Regan Rakers pt will need to f/u with PCP for refill request for potassium.  Pt's last K+ lab was drawn in July 2023

## 2022-07-02 ENCOUNTER — Ambulatory Visit
Admission: RE | Admit: 2022-07-02 | Discharge: 2022-07-02 | Disposition: A | Discharge status: Home / Self Care | Payer: Medicaid Other | Source: Ambulatory Visit | Attending: Hematology | Admitting: Hematology

## 2022-07-02 DIAGNOSIS — Z1231 Encounter for screening mammogram for malignant neoplasm of breast: Secondary | ICD-10-CM

## 2022-07-15 NOTE — Progress Notes (Deleted)
Office Visit Note  Patient: Claire Guzman             Date of Birth: 1965-09-26           MRN: [id number]             PCP: Lujean Amel, MD Referring: Lujean Amel, MD Visit Date: 07/29/2022 Occupation: '@GUAROCC'$ @  Subjective:  No chief complaint on file.   History of Present Illness: Claire Guzman is a 56 y.o. female ***     Activities of Daily Living:  Patient reports morning stiffness for *** {minute/hour:19697}.   Patient {ACTIONS;DENIES/REPORTS:21021675::"Denies"} nocturnal pain.  Difficulty dressing/grooming: {ACTIONS;DENIES/REPORTS:21021675::"Denies"} Difficulty climbing stairs: {ACTIONS;DENIES/REPORTS:21021675::"Denies"} Difficulty getting out of chair: {ACTIONS;DENIES/REPORTS:21021675::"Denies"} Difficulty using hands for taps, buttons, cutlery, and/or writing: {ACTIONS;DENIES/REPORTS:21021675::"Denies"}  No Rheumatology ROS completed.   PMFS History:  Patient Active Problem List   Diagnosis Date Noted   Plantar fasciitis 12/12/2021   Heel spur, left 12/12/2021   Headache, acute 08/08/2021   Low back pain 05/14/2021   Acquired absence of right breast 09/22/2018   Port-A-Cath in place 05/11/2018   Genetic testing 04/12/2018   Family history of prostate cancer    Family history of colon cancer    Family history of melanoma    Malignant neoplasm of upper-inner quadrant of right breast in female, estrogen receptor positive ([place]) 03/10/2018    Past Medical History:  Diagnosis Date   Abdominal pain, unspecified site    Anxiety state, unspecified    Complication of anesthesia    convulsions - when she has appendectomy about 12 years ago [facility]   Contact dermatitis and other eczema, due to unspecified cause    Essential hypertension, benign    Family history of colon cancer    Family history of melanoma    Family history of prostate cancer    GERD (gastroesophageal reflux disease)    Hematuria, unspecified    Hypertension    Lower  back pain    Mixed hyperlipidemia    Rosacea    rt breast ca dx'd 01/2018   R breast cancer   Unspecified symptom associated with female genital organs     Family History  Problem Relation Age of Onset   Heart failure Mother    Melanoma Mother 78       has had several removed over the past ten years   Atrial fibrillation Mother    Prostate cancer Father 11   Stroke Father    Throat cancer Father    Heart Problems Maternal Aunt        d.60   Heart Problems Maternal Uncle        d.60   Colon cancer Paternal Aunt        dx 78s   Colon cancer Paternal Uncle        dx 71s   Heart attack Maternal Grandmother    Heart attack Maternal Grandfather        possible cancer, unknown type   Stroke Paternal Grandmother    Colon cancer Paternal Grandfather        dx upper 26s   Healthy Daughter    Past Surgical History:  Procedure Laterality Date   BREAST RECONSTRUCTION WITH PLACEMENT OF TISSUE EXPANDER AND ALLODERM Right 09/14/2018   Procedure: BREAST RECONSTRUCTION WITH PLACEMENT OF TISSUE EXPANDER AND ALLODERM;  Surgeon: Irene Limbo, MD;  Location: [place];  Service: Plastics;  Laterality: Right;   CESAREAN SECTION     DIAGNOSTIC LAPAROSCOPY  EYE SURGERY     Lasik   LAPAROSCOPIC APPENDECTOMY     LIPOSUCTION WITH LIPOFILLING Right 07/26/2019   Procedure: LIPOFILLING from abdomen to R chest;  Surgeon: Irene Limbo, MD;  Location: [place];  Service: Plastics;  Laterality: Right;   MASTECTOMY WITH RADIOACTIVE SEED GUIDED EXCISION AND AXILLARY SENTINEL LYMPH NODE BIOPSY Right 09/14/2018   Procedure: RIGHT NIPPLE SPARING MASTECTOMY WITH TARGETED DEEP RIGHT AXILLARY LYMPH NODE BIOPSY WITH RADIOACTIVE SEED LOCALIZATION AND RIGHT AXILLARY SENTINEL LYMPH NODE BIOPSY, FROZEN SECTION AND POSSIBLE COMPLETION AXILLARY LYMPH NODE DISSECTION INJECT BLUE DYE RIGHT BREAST;  Surgeon: Fanny Skates, MD;  Location: [place];  Service: General;  Laterality: Right;   PLANTAR  FASCIA SURGERY Left 04/17/2022   PORT-A-CATH REMOVAL Left 07/26/2019   Procedure: REMOVAL PORT-A-CATH;  Surgeon: Irene Limbo, MD;  Location: [place];  Service: Plastics;  Laterality: Left;   PORTACATH PLACEMENT Left 04/13/2018   Procedure: INSERTION PORT-A-CATH WITH [place];  Surgeon: Fanny Skates, MD;  Location: [place];  Service: General;  Laterality: Left;   REMOVAL OF TISSUE EXPANDER AND PLACEMENT OF IMPLANT Right 07/26/2019   Procedure: REMOVAL OF TISSUE EXPANDER AND PLACEMENT OF IMPLANT;  Surgeon: Irene Limbo, MD;  Location: [place];  Service: Plastics;  Laterality: Right;   UTERINE FIBROID SURGERY     Social History   Social History Narrative   Not on file   Immunization History  Administered Date(s) Administered   Influenza,inj,Quad PF,6+ Mos 05/11/2018, 05/16/2019, 05/04/2020   Moderna Sars-Covid-2 Vaccination 10/13/2019, 11/10/2019, 08/14/2020   Tdap 02/06/2022     Objective: Vital Signs: LMP 03/28/2018 (Within Days)    Physical Exam   Musculoskeletal Exam: ***  CDAI Exam: CDAI Score: -- Patient Global: --; Provider Global: -- Swollen: --; Tender: -- Joint Exam 07/29/2022   No joint exam has been documented for this visit   There is currently no information documented on the homunculus. Go to the Rheumatology activity and complete the homunculus joint exam.  Investigation: No additional findings.  Imaging: MM 3D SCREEN BREAST UNI LEFT  Result Date: 07/03/2022 CLINICAL DATA:  Screening. EXAM: DIGITAL SCREENING UNILATERAL LEFT MAMMOGRAM WITH CAD AND TOMOSYNTHESIS TECHNIQUE: Left screening digital craniocaudal and mediolateral oblique mammograms were obtained. Left screening digital breast tomosynthesis was performed. The images were evaluated with computer-aided detection. COMPARISON:  Previous exam(s). ACR Breast Density Category b: There are scattered areas of fibroglandular density. FINDINGS: The  patient has had a right mastectomy. There are no findings suspicious for malignancy. IMPRESSION: No mammographic evidence of malignancy. A result letter of this screening mammogram will be mailed directly to the patient. RECOMMENDATION: Screening mammogram in one year.  (Code:SM-L-52M) BI-RADS CATEGORY  1: Negative. Electronically Signed   By: Margarette Canada M.D.   On: 07/03/2022 13:05    Recent Labs: Lab Results  Component Value Date   WBC 5.2 02/17/2022   HGB 12.9 02/17/2022   PLT 179 02/17/2022   NA 141 02/17/2022   K 3.8 02/17/2022   CL 106 02/17/2022   CO2 28 02/17/2022   GLUCOSE 102 (H) 02/17/2022   BUN 13 02/17/2022   CREATININE 0.68 02/17/2022   BILITOT 0.8 02/17/2022   ALKPHOS 87 02/17/2022   AST 17 02/17/2022   ALT 13 02/17/2022   PROT 7.2 02/17/2022   ALBUMIN 4.5 02/17/2022   CALCIUM 9.7 02/17/2022   GFRAA >60 03/02/2020    Speciality Comments: No specialty comments available.  Procedures:  No procedures performed Allergies: Trazodone and  nefazodone, Metrogel [metronidazole], Other, Paxil [paroxetine hcl], Sulfamethoxazole, Zomig [zolmitriptan], and Prednisone   Assessment / Plan:     Visit Diagnoses: No diagnosis found.  Orders: No orders of the defined types were placed in this encounter.  No orders of the defined types were placed in this encounter.   Face-to-face time spent with patient was *** minutes. Greater than 50% of time was spent in counseling and coordination of care.  Follow-Up Instructions: No follow-ups on file.   Earnestine Mealing, CMA  Note - This record has been created using Editor, commissioning.  Chart creation errors have been sought, but may not always  have been located. Such creation errors do not reflect on  the standard of medical care.

## 2022-07-24 ENCOUNTER — Telehealth: Payer: Self-pay | Admitting: Nurse Practitioner

## 2022-07-24 NOTE — Telephone Encounter (Signed)
Patient aware of changed appointments  

## 2022-07-29 ENCOUNTER — Ambulatory Visit: Payer: Medicaid Other | Admitting: Physician Assistant

## 2022-07-29 DIAGNOSIS — Z808 Family history of malignant neoplasm of other organs or systems: Secondary | ICD-10-CM

## 2022-07-29 DIAGNOSIS — M2241 Chondromalacia patellae, right knee: Secondary | ICD-10-CM

## 2022-07-29 DIAGNOSIS — Z800 Family history of malignant neoplasm of digestive organs: Secondary | ICD-10-CM

## 2022-07-29 DIAGNOSIS — Z9011 Acquired absence of right breast and nipple: Secondary | ICD-10-CM

## 2022-07-29 DIAGNOSIS — C44519 Basal cell carcinoma of skin of other part of trunk: Secondary | ICD-10-CM

## 2022-07-29 DIAGNOSIS — M359 Systemic involvement of connective tissue, unspecified: Secondary | ICD-10-CM

## 2022-07-29 DIAGNOSIS — M810 Age-related osteoporosis without current pathological fracture: Secondary | ICD-10-CM

## 2022-07-29 DIAGNOSIS — M47812 Spondylosis without myelopathy or radiculopathy, cervical region: Secondary | ICD-10-CM

## 2022-07-29 DIAGNOSIS — Z8042 Family history of malignant neoplasm of prostate: Secondary | ICD-10-CM

## 2022-07-29 DIAGNOSIS — M722 Plantar fascial fibromatosis: Secondary | ICD-10-CM

## 2022-07-29 DIAGNOSIS — M5136 Other intervertebral disc degeneration, lumbar region: Secondary | ICD-10-CM

## 2022-07-29 DIAGNOSIS — M4156 Other secondary scoliosis, lumbar region: Secondary | ICD-10-CM

## 2022-07-29 DIAGNOSIS — M19041 Primary osteoarthritis, right hand: Secondary | ICD-10-CM

## 2022-07-29 DIAGNOSIS — Z170 Estrogen receptor positive status [ER+]: Secondary | ICD-10-CM

## 2022-07-29 DIAGNOSIS — Z79899 Other long term (current) drug therapy: Secondary | ICD-10-CM

## 2022-08-05 ENCOUNTER — Encounter: Payer: Self-pay | Admitting: Nurse Practitioner

## 2022-08-05 NOTE — Progress Notes (Signed)
Office Visit Note  Patient: Claire Guzman             Date of Birth: 1966/05/29           MRN: [id number]             PCP: Lujean Amel, MD Referring: Lujean Amel, MD Visit Date: 08/19/2022 Occupation: '@GUAROCC'$ @  Subjective:  Arthralgias  History of Present Illness: Claire Guzman is a 57 y.o. female with history of undifferentiated connective tissue disease, osteoarthritis, DDD, and osteoporosis.  Patient is currently taking plaqeunil '200mg'$  BID Monday through Friday.  Patient was started on Plaquenil after her last office visit on 05/27/2022.  She has been tolerating Plaquenil without any side effects.  She has not yet noticed any improvement in her symptoms.  She states she continues to have ongoing joint pain in both shoulders, her lower back, and both knees.  She states she continues to have swelling on the dorsal aspect of the left foot status post plantar fascial release in September 2023.  She has been taking Advil as needed for pain relief.   She denies any recent rashes, Raynaud's phenomenon, or oral ulcers.  She continues to have sicca symptoms.  She has been using Systane eyedrops and plans on using Biotene products for symptomatic relief.   Activities of Daily Living:  Patient reports morning stiffness for 1 hour.   Patient Denies nocturnal pain.  Difficulty dressing/grooming: Denies Difficulty climbing stairs: Denies Difficulty getting out of chair: Denies Difficulty using hands for taps, buttons, cutlery, and/or writing: Denies  Review of Systems  Constitutional:  Positive for fatigue.  HENT:  Positive for mouth dryness. Negative for mouth sores.   Eyes:  Positive for dryness.  Respiratory:  Negative for shortness of breath.   Cardiovascular:  Negative for chest pain and palpitations.  Gastrointestinal:  Negative for blood in stool, constipation and diarrhea.  Endocrine: Positive for increased urination.  Genitourinary:  Positive for difficulty  urinating. Negative for involuntary urination.  Musculoskeletal:  Positive for joint pain, joint pain, joint swelling, morning stiffness and muscle tenderness. Negative for gait problem, myalgias, muscle weakness and myalgias.  Skin:  Positive for hair loss and nodules/bumps. Negative for color change, rash and sensitivity to sunlight.  Allergic/Immunologic: Negative for susceptible to infections.  Neurological:  Negative for dizziness and headaches.  Hematological:  Negative for swollen glands.  Psychiatric/Behavioral:  Positive for sleep disturbance. Negative for depressed mood. The patient is not nervous/anxious.     PMFS History:  Patient Active Problem List   Diagnosis Date Noted   Plantar fasciitis 12/12/2021   Heel spur, left 12/12/2021   Headache, acute 08/08/2021   Low back pain 05/14/2021   Acquired absence of right breast 09/22/2018   Port-A-Cath in place 05/11/2018   Genetic testing 04/12/2018   Family history of prostate cancer    Family history of colon cancer    Family history of melanoma    Malignant neoplasm of upper-inner quadrant of right breast in female, estrogen receptor positive ([place]) 03/10/2018    Past Medical History:  Diagnosis Date   Abdominal pain, unspecified site    Anxiety state, unspecified    Complication of anesthesia    convulsions - when she has appendectomy about 12 years ago [facility]   Contact dermatitis and other eczema, due to unspecified cause    Essential hypertension, benign    Family history of colon cancer    Family history of melanoma    Family  history of prostate cancer    GERD (gastroesophageal reflux disease)    Hematuria, unspecified    Hypertension    Lower back pain    Mixed hyperlipidemia    Rosacea    rt breast ca dx'd 01/2018   R breast cancer   Unspecified symptom associated with female genital organs     Family History  Problem Relation Age of Onset   Heart failure Mother    Melanoma Mother 68       has had  several removed over the past ten years   Atrial fibrillation Mother    Prostate cancer Father 1   Stroke Father    Throat cancer Father    Heart Problems Maternal Aunt        d.60   Heart Problems Maternal Uncle        d.60   Colon cancer Paternal Aunt        dx 81s   Colon cancer Paternal Uncle        dx 47s   Heart attack Maternal Grandmother    Heart attack Maternal Grandfather        possible cancer, unknown type   Stroke Paternal Grandmother    Colon cancer Paternal Grandfather        dx upper 77s   Healthy Daughter    Past Surgical History:  Procedure Laterality Date   BREAST RECONSTRUCTION WITH PLACEMENT OF TISSUE EXPANDER AND ALLODERM Right 09/14/2018   Procedure: BREAST RECONSTRUCTION WITH PLACEMENT OF TISSUE EXPANDER AND ALLODERM;  Surgeon: Irene Limbo, MD;  Location: [place];  Service: Plastics;  Laterality: Right;   CESAREAN SECTION     DIAGNOSTIC LAPAROSCOPY     EYE SURGERY     Lasik   LAPAROSCOPIC APPENDECTOMY     LIPOSUCTION WITH LIPOFILLING Right 07/26/2019   Procedure: LIPOFILLING from abdomen to R chest;  Surgeon: Irene Limbo, MD;  Location: [place];  Service: Plastics;  Laterality: Right;   MASTECTOMY WITH RADIOACTIVE SEED GUIDED EXCISION AND AXILLARY SENTINEL LYMPH NODE BIOPSY Right 09/14/2018   Procedure: RIGHT NIPPLE SPARING MASTECTOMY WITH TARGETED DEEP RIGHT AXILLARY LYMPH NODE BIOPSY WITH RADIOACTIVE SEED LOCALIZATION AND RIGHT AXILLARY SENTINEL LYMPH NODE BIOPSY, FROZEN SECTION AND POSSIBLE COMPLETION AXILLARY LYMPH NODE DISSECTION INJECT BLUE DYE RIGHT BREAST;  Surgeon: Fanny Skates, MD;  Location: [place];  Service: General;  Laterality: Right;   PLANTAR FASCIA SURGERY Left 04/17/2022   PORT-A-CATH REMOVAL Left 07/26/2019   Procedure: REMOVAL PORT-A-CATH;  Surgeon: Irene Limbo, MD;  Location: [place];  Service: Plastics;  Laterality: Left;   PORTACATH PLACEMENT Left 04/13/2018   Procedure:  INSERTION PORT-A-CATH WITH [place];  Surgeon: Fanny Skates, MD;  Location: [place];  Service: General;  Laterality: Left;   REMOVAL OF TISSUE EXPANDER AND PLACEMENT OF IMPLANT Right 07/26/2019   Procedure: REMOVAL OF TISSUE EXPANDER AND PLACEMENT OF IMPLANT;  Surgeon: Irene Limbo, MD;  Location: [place];  Service: Plastics;  Laterality: Right;   UTERINE FIBROID SURGERY     Social History   Social History Narrative   Not on file   Immunization History  Administered Date(s) Administered   Influenza,inj,Quad PF,6+ Mos 05/11/2018, 05/16/2019, 05/04/2020   Moderna Sars-Covid-2 Vaccination 10/13/2019, 11/10/2019, 08/14/2020   Tdap 02/06/2022     Objective: Vital Signs: BP (!) 172/84 (BP Location: Right Arm, Patient Position: Sitting, Cuff Size: Normal)   Pulse 66   Resp 15   Ht '5\' 4"'$  (1.626 m)  Wt 167 lb 6.4 oz (75.9 kg)   LMP 03/28/2018 (Within Days)   BMI 28.73 kg/m    Physical Exam Vitals and nursing note reviewed.  Constitutional:      Appearance: She is well-developed.  HENT:     Head: Normocephalic and atraumatic.  Eyes:     Conjunctiva/sclera: Conjunctivae normal.  Cardiovascular:     Rate and Rhythm: Normal rate and regular rhythm.     Heart sounds: Normal heart sounds.  Pulmonary:     Effort: Pulmonary effort is normal.     Breath sounds: Normal breath sounds.  Abdominal:     General: Bowel sounds are normal.     Palpations: Abdomen is soft.  Musculoskeletal:     Cervical back: Normal range of motion.  Skin:    General: Skin is warm and dry.     Capillary Refill: Capillary refill takes less than 2 seconds.  Neurological:     Mental Status: She is alert and oriented to person, place, and time.  Psychiatric:        Behavior: Behavior normal.      Musculoskeletal Exam: C-spine has good range of motion with some discomfort with lateral rotation.  Shoulder joints have good range of motion with some tenderness upon  palpation.  Elbow joints, wrist joints, MCPs, PIPs, DIPs have good range of motion with no synovitis.  PIP and DIP thickening noted.  Hip joints have good range of motion with no groin pain.  Knee joints have good range of motion with no warmth or effusion.  Ankle joints have good range of motion with no joint tenderness.  Surgical scar on the medial aspect of the left heel noted.  Some diffuse pedal edema noted on the dorsal aspect of the left foot.  CDAI Exam: CDAI Score: -- Patient Global: --; Provider Global: -- Swollen: --; Tender: -- Joint Exam 08/19/2022   No joint exam has been documented for this visit   There is currently no information documented on the homunculus. Go to the Rheumatology activity and complete the homunculus joint exam.  Investigation: No additional findings.  Imaging: No results found.  Recent Labs: Lab Results  Component Value Date   WBC 4.9 08/19/2022   HGB 13.1 08/19/2022   PLT 176 08/19/2022   NA 142 08/19/2022   K 3.5 08/19/2022   CL 106 08/19/2022   CO2 30 08/19/2022   GLUCOSE 89 08/19/2022   BUN 14 08/19/2022   CREATININE 0.62 08/19/2022   BILITOT 0.5 08/19/2022   ALKPHOS 80 08/19/2022   AST 15 08/19/2022   ALT 10 08/19/2022   PROT 7.0 08/19/2022   ALBUMIN 4.4 08/19/2022   CALCIUM 9.5 08/19/2022   GFRAA >60 03/02/2020    Speciality Comments: PLQ Eye Exam: 07/30/2022 WNL @ [facility] Follow up 1 year.   Procedures:  No procedures performed Allergies: Trazodone and nefazodone, Metrogel [metronidazole], Other, Paxil [paroxetine hcl], Sulfamethoxazole, Zomig [zolmitriptan], and Prednisone   Assessment / Plan:     Visit Diagnoses: Undifferentiated connective tissue disease ([place]) - Positive ANA, positive double-stranded DNA, dry mouth, polyarthralgia: Patient was initiated on Plaquenil 200 mg 1 tablet by mouth twice daily Monday through Friday after her last office visit on 05/27/2022.  She has been tolerating Plaquenil without  any side effects.  She has not yet noticed any improvement in her symptoms.  She continues to have arthralgias involving multiple joints including both shoulders, lower back, and both knee joints.  No synovitis was noted on examination  today.  She has ongoing sicca symptoms so the use of over-the-counter products were discussed today in detail.  She plans on continuing to use Systane eyedrops and will start using Biotene products or XyliMelts for dry mouth.  She is not experiencing any oral or nasal ulcers.  She has not had any symptoms of Raynaud's phenomenon.  No malar rash or photosensitivity. She will remain on Plaquenil as prescribed.  She is willing to give Plaquenil more time and for [place] to reassess for the full efficacy in 8 weeks.  She was advised to notify us if she develops any new or worsening symptoms.  High risk medication use - Plaquenil '200mg'$  1 tablet by mouth twice daily Monday through Friday.  Patient initiated Plaquenil after her last office visit on 05/27/2022. CBC and CMP were drawn today and results reviewed with the patient today in the office.  Her next lab work will be due in 3 months then every 5 months to monitor for drug toxicity. PLQ Eye Exam: 07/30/2022 WNL @ [facility] Follow up 1 year.   Primary osteoarthritis of both hands - X-rays consistent with osteoarthritis.  Repeat rheumatoid factor negative.  Anti-CCP negative.  PIP and DIP thickening noted.  No synovitis noted.  Complete fist formation bilaterally.  She has been experiencing intermittent triggering of the left ring finger.  Conservative treatment options were discussed today.  Chondromalacia patellae, right knee - Chondromalacia patella of the right knee joint was noted.  Left knee joint x-rays were unremarkable.  Good range of motion of both knee joints noted on examination today.  She continues to have intermittent discomfort in both knees but no warmth or swelling noted.  Plantar fasciitis - Recent  surgery for [place] fasciitis by Dr. Jacqualyn Posey on April 17, 2022.  Surgical scar is healed.  She continues to have some discomfort in edema on the dorsal aspect of the left foot.  According to the patient she was advised by her podiatrist to wear compression stockings but she has difficulty doing so.  Arthropathy of cervical facet joint - History of chronic neck pain for which she goes to chiropractor and had acupuncture.  X-rays showed facet joint arthropathy.  C-spine has good range of motion with some discomfort with lateral rotation.  Other secondary scoliosis, lumbar region: She continues to have ongoing discomfort in her lower back.  She has some tenderness over the left SI joint on examination today.  DDD (degenerative disc disease), lumbar - History of lower back pain for many years.  MRI in 2022 showed multilevel degenerative disc disease.  Age-related osteoporosis without current pathological fracture - On Zometa infusions by her oncologist.  Last infusion September 19, 2021.  Other medical conditions are listed as follows:  Malignant neoplasm of upper-inner quadrant of right breast in female, estrogen receptor positive ([place]) - Diagnosed in 2019.  Status postmastectomy, chemotherapy and radiation therapy.  Acquired absence of right breast  Basal cell carcinoma (BCC) of skin of other part of torso  Family history of melanoma-mother  Family history of colon cancer-paternal grandfather, paternal aunt, paternal uncle  Family history of prostate cancer-father  Orders: No orders of the defined types were placed in this encounter.  No orders of the defined types were placed in this encounter.   Follow-Up Instructions: Return in about 8 weeks (around 10/14/2022) for UCTD.   Ofilia Neas, PA-C  Note - This record has been created using Dragon software.  Chart creation errors have been sought, but may  not always  have been located. Such creation errors do not reflect on  the  standard of medical care.

## 2022-08-18 ENCOUNTER — Other Ambulatory Visit: Payer: Self-pay

## 2022-08-18 DIAGNOSIS — C50211 Malignant neoplasm of upper-inner quadrant of right female breast: Secondary | ICD-10-CM

## 2022-08-18 NOTE — Progress Notes (Signed)
Patient Care Team: Lujean Amel, MD as PCP - General (Family Medicine) Fanny Skates, MD as Consulting Physician (General Surgery) Truitt Merle, MD as Consulting Physician (Hematology) Eppie Gibson, MD as Attending Physician (Radiation Oncology) Mauro Kaufmann, RN as Oncology Nurse Navigator Rockwell Germany, RN as Oncology Nurse Navigator Medicine, [facility] (Family Medicine)   CHIEF COMPLAINT: Follow-up right breast cancer  Oncology History Overview Note  Cancer Staging Malignant neoplasm of upper-inner quadrant of right breast in female, estrogen receptor positive ([place]) Staging form: Breast, AJCC 8th Edition - Clinical stage from 03/08/2018: Stage IB (cT2, cN1, cM0, G3, ER+, PR+, HER2+) - Signed by Truitt Merle, MD on 03/16/2018     Malignant neoplasm of upper-inner quadrant of right breast in female, estrogen receptor positive ([place])  02/24/2018 Mammogram   screening mammogram on 02/24/2018 that was benign   03/04/2018 Mammogram   diagnostic mammogram on 03/04/2018 due to a palpable mass on her right breast. Diagnostic mammogram, breast US revealed and biopsy revealed suspicious mass in the right breast at 2:30 at the palpable site of concern and one suspicious right axillary LN.   03/08/2018 Cancer Staging   Staging form: Breast, AJCC 8th Edition - Clinical stage from 03/08/2018: Stage IB (cT2, cN1, cM0, G3, ER+, PR+, HER2+) - Signed by Truitt Merle, MD on 03/16/2018   03/08/2018 Receptors her2   Her2 positive, ER 90% PR 5% and Ki67 60%   03/08/2018 Pathology Results   Diagnosis 1. Breast, right, needle core biopsy, 2:30 - INVASIVE DUCTAL CARCINOMA, GRADE 3. - LYMPHOVASCULAR SPACE INVOLVEMENT BY TUMOR. 2. Lymph node, needle/core biopsy, right axilla - METASTATIC CARCINOMA INVOLVING SCANT LYMPHOID TISSUE.   03/10/2018 Initial Diagnosis   Malignant neoplasm of upper-inner quadrant of right breast in female, estrogen receptor positive ([place])   03/20/2018 Imaging    MRI brain 03/20/18 IMPRESSION: No evidence of metastatic disease.  Normal appearance of brain. Slightly heterogeneous marrow pattern of the upper cervical spine, nonspecific but likely benign, especially in the setting of early stage disease.    03/24/2018 Echocardiogram   Baseline ECHO 03/24/18 Study Conclusions - Left ventricle: The cavity size was normal. Wall thickness was   increased in a pattern of mild LVH. Systolic function was normal.   The estimated ejection fraction was in the range of 60% to 65%.   Wall motion was normal; there were no regional wall motion   abnormalities. Doppler parameters are consistent with abnormal   left ventricular relaxation (grade 1 diastolic dysfunction). - Right ventricle: The cavity size was mildly dilated. Wall   thickness was normal.   03/25/2018 Imaging   MRI Breast B/l 03/25/18 IMPRESSION: 1. The patient's primary malignancy in the posterior right breast measures 3.2 x 2 x 2.6 cm with apparent invasion of the underlying pectoralis muscle. Numerous surrounding abnormal enhancing masses, consistent with satellite lesions, extend from 11 o'clock in the right breast into the inferolateral right breast with a total span of suspected disease measuring 4.5 x 4.6 x 6.4 cm in AP, transverse, and craniocaudal dimension. The suspected extent of disease involves the superior and inferolateral quadrants. The primary malignancy also extends just across midline into the superior medial quadrant. 2. No MRI evidence of malignancy in the left breast. 3. Known metastatic noted in the right axilla.   03/25/2018 Imaging   Whole body bone scan 03/25/18 IMPRESSION: No scintigraphic evidence skeletal metastasis.   03/25/2018 Imaging   CT CAP W Contrast 03/25/18 IMPRESSION: 1. Mass within the deep aspect  of the right breast along the pectoralis muscle compatible with primary breast malignancy. 2. There is suggestion of two additional possible masses within  the lateral aspect of the right breast versus nodular appearing breast tissue. Recommend dedicated evaluation with bilateral breast MRI. 3. Mildly thickened right axillary lymph node compatible with metastatic adenopathy, recently biopsied. 4. Multiple bilateral pulmonary nodules are indeterminate, potentially sequelae of prior infectious/inflammatory process. Recommend attention on follow-up. Consider follow-up CT in 6 months. 5. Portal venous gas is demonstrated within the left hepatic lobe. Additionally, there is wall thickening of the cecum and ascending colon with small amount of gas within the pericolonic vasculature. Constellation of findings is indeterminate in etiology however may be secondary to colitis at this location with considerations including infectious, inflammatory or ischemic etiologies. Correlate for recent procedure. If patient is not up-to-date for colonic screening, recommend further evaluation with colonoscopy to exclude the possibility of colonic mass within the cecum/ascending colon. 6. These results were called by telephone at the time of interpretation on 03/25/2018 at 9:31 am to Dr. Truitt Merle , who verbally acknowledged these results.   03/30/2018 - 04/08/2019 Chemotherapy   Neoadjuvant TCHP every 3 weeks starting 03/30/18. Completed 6 cycles of TCHP on 07/23/18.  Continued with maintenance Herceptin/Perjeta q3weeks to complete 1 year.    04/09/2018 Genetic Testing   Negative genetic testing on the [employer] Common Hereditary Cancers Panel. The Common Hereditary Cancers Panel offered by [employer] includes sequencing and/or deletion duplication testing of the following 47 genes: APC, ATM, AXIN2, BARD1, BMPR1A, BRCA1, BRCA2, BRIP1, CDH1, CDKN2A (p14ARF), CDKN2A (p16INK4a), CKD4, CHEK2, CTNNA1, DICER1, EPCAM (Deletion/duplication testing only), GREM1 (promoter region deletion/duplication testing only), KIT, MEN1, MLH1, MSH2, MSH3, MSH6, MUTYH, NBN, NF1, NHTL1, PALB2, PDGFRA,  PMS2, POLD1, POLE, PTEN, RAD50, RAD51C, RAD51D, SDHB, SDHC, SDHD, SMAD4, SMARCA4. STK11, TP53, TSC1, TSC2, and VHL.  The following genes were evaluated for sequence changes only: SDHA and HOXB13 c.251G>A variant only.   Genetic testing did detect a Variant of Unknown Significance (VUS) in the POLD1 gene called c.985C>T (p.Pro329Ser). At this time, it is unknown if this variant is associated with increased cancer risk or if this is a normal finding, but most variants such as this get reclassified to being inconsequential. It should not be used to make medical management decisions.   The report date is 04/09/2018.   07/24/2018 Breast MRI   IMPRESSION: 1. Resolution of the enhancing mass (known cancer) and small satellite lesions previously seen in the right breast following chemotherapy.   2. The metastatic lymph node in the right axilla has decreased in size.   3.  No MRI evidence of left breast malignancy.   09/14/2018 Surgery   RIGHT NIPPLE SPARING MASTECTOMY WITH TARGETED DEEP RIGHT AXILLARY LYMPH NODE BIOPSY WITH RADIOACTIVE SEED LOCALIZATION AND RIGHT AXILLARY SENTINEL LYMPH NODE BIOPSY by Dr. Dalbert Batman  09/14/18    09/14/2018 Pathology Results   Diagnosis 1. Lymph node, sentinel, biopsy, right axillary with radioactive seed - ONE OF ONE LYMPH NODES NEGATIVE FOR CARCINOMA (0/1). - MILD TREATMENT EFFECT. - BIOPSY SITE. 2. Lymph node, sentinel, biopsy, right axillary - ONE OF ONE LYMPH NODES NEGATIVE FOR CARCINOMA (0/1). 3. Lymph node, sentinel, biopsy, right - ONE OF ONE LYMPH NODES NEGATIVE FOR CARCINOMA (0/1). 4. Lymph node, sentinel, biopsy, right - ONE OF ONE LYMPH NODES NEGATIVE FOR CARCINOMA (0/1). 5. Lymph node, sentinel, biopsy, right - ONE OF ONE LYMPH NODES NEGATIVE FOR CARCINOMA (0/1). 6. Lymph node, sentinel, biopsy, right - ONE OF ONE  LYMPH NODES NEGATIVE FOR CARCINOMA (0/1). 7. Lymph node, sentinel, biopsy, right - ONE OF ONE LYMPH NODES NEGATIVE FOR CARCINOMA  (0/1). 8. Lymph node, sentinel, biopsy, right - ONE OF ONE LYMPH NODES NEGATIVE FOR CARCINOMA (0/1). 9. Lymph node, sentinel, biopsy, right - ONE OF ONE LYMPH NODES NEGATIVE FOR CARCINOMA (0/1). 10. Lymph node, sentinel, biopsy, right - ONE OF ONE LYMPH NODES NEGATIVE FOR CARCINOMA (0/1). 11. Lymph node, sentinel, biopsy, right - ONE OF ONE LYMPH NODES NEGATIVE FOR CARCINOMA (0/1). 12. Lymph node, sentinel, biopsy, right - ONE OF ONE LYMPH NODES NEGATIVE FOR CARCINOMA (0/1). 13. Nipple Biopsy, right - BENIGN NIPPLE TISSUE. 14. Breast, simple mastectomy, right - NO RESIDUAL CARCINOMA IDENTIFIED. - TREATMENT EFFECT. - FIBROCYSTIC CHANGE. - ONE OF ONE LYMPH NODES NEGATIVE FOR CARCINOMA (0/1). - SEE ONCOLOGY TABLE.   09/14/2018 Cancer Staging   Staging form: Breast, AJCC 8th Edition - Pathologic stage from 09/14/2018: No Stage Recommended (ypT0, pN0, cM0, GX, ER: Not Assessed, PR: Not Assessed, HER2: Not Assessed) - Signed by Truitt Merle, MD on 09/23/2018   11/03/2018 - 12/10/2018 Radiation Therapy   Adjuvant Radiation with Dr. Isidore Moos    12/27/2018 -  Anti-estrogen oral therapy   Start Tamoxifen '20mg'$  daily in 12/27/2018 stopped after 1 week due to poor toleration. She started anastrozole on 02/04/19 but stopped on 02/14/19 due to worsening muscle aches. She tried Letrozole but did not tolerate. She was switched to Exemestane in late 04/2019.    01/04/2019 Imaging   CT chest  IMPRESSION: 1. Stable tiny subpleural and perifissural nodules bilaterally. No suspicious pulmonary nodule or mass. 2. No focal airspace disease in the right lung. 3.  Aortic Atherosclerois (ICD10-170.0)     03/08/2019 Imaging   CT AP W Contrast 03/08/19  IMPRESSION: 1. No metastatic disease in the abdomen pelvis. 2. No evidence adverse immunotherapy therapy reaction.   03/08/2019 Imaging   Whole body bone scan 03/08/19  IMPRESSION: No definite scintigraphic evidence of osseous metastases.   08/29/2019 - 08/29/2020  Chemotherapy   Started Nerlynx '240mg'$  daily with budesonide on 08/29/19. Reduced to '200mg'$  in late 08/2019. Completed on 08/29/20        CURRENT THERAPY:  -Breast cancer surveillance alone, due to AI intolerance -Zometa q6 months x2 years, started 09/17/20 (s/p 3 doses)  INTERVAL HISTORY Ms. Claire Guzman returns for follow-up as scheduled, last seen by Dr. Burr Medico 02/17/2022. Left mammogram 07/02/22 was negative.  She sent a MyChart message in early 07/2022 reporting 3 months of nagging back pain behind her chest, thinking it may be her right kidney or MSK pain.  He was seen by PCP 08/11/2022 who felt this was consistent with a UTI and prescribed antibiotics, then stopped due to negative UA (only RBC+).  She continues to have intermittent R flank pain, and recently noticed urinary frequency in afternoon/evening and urine stops mid-stream. She does not see obvious blood but urine is darker. Only mild dysuria. PCP said to discuss with [place].   Today she presents by herself, feeling tired the past couple mornings. She doesn't sleep well, takes 1/2 xanax PRN to help. She has left foot pain and leg swelling intermittently she wonders if related to RA.   Denies breast concerns such as new lump/mass, nipple discharge or inversion, or skin change.  ROS  All other systems reviewed and negative  Past Medical History:  Diagnosis Date   Abdominal pain, unspecified site    Anxiety state, unspecified    Complication of anesthesia  convulsions - when she has appendectomy about 12 years ago at [facility]   Contact dermatitis and other eczema, due to unspecified cause    Essential hypertension, benign    Family history of colon cancer    Family history of melanoma    Family history of prostate cancer    GERD (gastroesophageal reflux disease)    Hematuria, unspecified    Hypertension    Lower back pain    Mixed hyperlipidemia    Rosacea    rt breast ca dx'd 01/2018   R breast cancer   Unspecified symptom associated  with female genital organs      Past Surgical History:  Procedure Laterality Date   BREAST RECONSTRUCTION WITH PLACEMENT OF TISSUE EXPANDER AND ALLODERM Right 09/14/2018   Procedure: BREAST RECONSTRUCTION WITH PLACEMENT OF TISSUE EXPANDER AND ALLODERM;  Surgeon: Irene Limbo, MD;  Location: [place];  Service: Plastics;  Laterality: Right;   CESAREAN SECTION     DIAGNOSTIC LAPAROSCOPY     EYE SURGERY     Lasik   LAPAROSCOPIC APPENDECTOMY     LIPOSUCTION WITH LIPOFILLING Right 07/26/2019   Procedure: LIPOFILLING from abdomen to R chest;  Surgeon: Irene Limbo, MD;  Location: [place];  Service: Plastics;  Laterality: Right;   MASTECTOMY WITH RADIOACTIVE SEED GUIDED EXCISION AND AXILLARY SENTINEL LYMPH NODE BIOPSY Right 09/14/2018   Procedure: RIGHT NIPPLE SPARING MASTECTOMY WITH TARGETED DEEP RIGHT AXILLARY LYMPH NODE BIOPSY WITH RADIOACTIVE SEED LOCALIZATION AND RIGHT AXILLARY SENTINEL LYMPH NODE BIOPSY, FROZEN SECTION AND POSSIBLE COMPLETION AXILLARY LYMPH NODE DISSECTION INJECT BLUE DYE RIGHT BREAST;  Surgeon: Fanny Skates, MD;  Location: [place];  Service: General;  Laterality: Right;   PLANTAR FASCIA SURGERY Left 04/17/2022   PORT-A-CATH REMOVAL Left 07/26/2019   Procedure: REMOVAL PORT-A-CATH;  Surgeon: Irene Limbo, MD;  Location: [place];  Service: Plastics;  Laterality: Left;   PORTACATH PLACEMENT Left 04/13/2018   Procedure: INSERTION PORT-A-CATH WITH [place];  Surgeon: Fanny Skates, MD;  Location: [place];  Service: General;  Laterality: Left;   REMOVAL OF TISSUE EXPANDER AND PLACEMENT OF IMPLANT Right 07/26/2019   Procedure: REMOVAL OF TISSUE EXPANDER AND PLACEMENT OF IMPLANT;  Surgeon: Irene Limbo, MD;  Location: [place];  Service: Plastics;  Laterality: Right;   UTERINE FIBROID SURGERY       Outpatient Encounter Medications as of 08/19/2022  Medication Sig   ALPRAZolam (XANAX) 0.5 MG tablet  TAKE 1 TABLET(0.5 MG) BY MOUTH TWICE DAILY AS NEEDED FOR ANXIETY   atenolol (TENORMIN) 50 MG tablet Take 50 mg by mouth at bedtime.   Cholecalciferol (VITAMIN D3) 50 MCG (2000 UT) capsule Take 2,000 Units by mouth daily.   hydroxychloroquine (PLAQUENIL) 200 MG tablet Take 1 tablet 200 mg BID Monday-Friday   ibuprofen (ADVIL) 800 MG tablet Take 1 tablet (800 mg total) by mouth every 8 (eight) hours as needed.   lactulose (CHRONULAC) 10 GM/15ML solution TAKE 20 MLS BY MOUTH EVERY 4 HOURS AS NEEDED FOR CONSTIPATION   methocarbamol (ROBAXIN) 750 MG tablet Take 1 tablet (750 mg total) by mouth 3 (three) times daily as needed (muscle spasm/pain).   valACYclovir (VALTREX) 500 MG tablet valacyclovir 500 mg tablet  TAKE 1 TABLET BY MOUTH TWICE DAILY   venlafaxine XR (EFFEXOR-XR) 150 MG 24 hr capsule TAKE 1 CAPSULE(150 MG) BY MOUTH DAILY WITH BREAKFAST   [DISCONTINUED] cephALEXin (KEFLEX) 500 MG capsule Take 500 mg by mouth 2 (two) times daily.   naproxen (  NAPROSYN) 500 MG tablet TAKE 1 TABLET(500 MG) BY MOUTH TWICE DAILY WITH A MEAL (Patient taking differently: as needed. TAKE 1 TABLET(500 MG) BY MOUTH TWICE DAILY WITH A MEAL)   traMADol (ULTRAM) 50 MG tablet TAKE 1 TABLET BY MOUTH EVERY 12 HOURS AS NEEDED (Patient not taking: Reported on 08/19/2022)   [DISCONTINUED] exemestane (AROMASIN) 25 MG tablet TAKE 1 TABLET(25 MG) BY MOUTH DAILY AFTER BREAKFAST (Patient not taking: Reported on 08/19/2022)   [DISCONTINUED] omeprazole (PRILOSEC) 20 MG capsule Take 1 capsule (20 mg total) by mouth 2 (two) times daily before a meal. (Patient not taking: Reported on 01/03/2020)   [DISCONTINUED] potassium chloride SA (KLOR-CON M) 20 MEQ tablet TAKE 1 TABLET BY MOUTH TWICE DAILY (Patient not taking: Reported on 08/19/2022)   [DISCONTINUED] prochlorperazine (COMPAZINE) 10 MG tablet TAKE 1 TABLET BY MOUTH EVERY 6 HOURS AS NEEDED FOR NAUSEA OR VOMITING   No facility-administered encounter medications on file as of 08/19/2022.      Today's Vitals   08/19/22 0919 08/19/22 0930  BP: 124/80   Pulse: 60   Resp: 14   Temp: 97.8 F (36.6 C)   TempSrc: Oral   SpO2: 99%   Weight: 167 lb (75.8 kg)   PainSc:  0-No pain   Body mass index is 28.67 kg/m.   PHYSICAL EXAM GENERAL:alert, no distress and comfortable SKIN: no rash  EYES: sclera clear NECK: without mass LYMPH:  no palpable cervical or supraclavicular lymphadenopathy  LUNGS: clear with normal breathing effort HEART: regular rate & rhythm, no lower extremity edema ABDOMEN: abdomen soft, non-tender and normal bowel sounds MSK: No focal tenderness over the posterior right lower ribs or right flank NEURO: alert & oriented x 3 with fluent speech, no focal motor/sensory deficits Breast exam: S/p right mastectomy and reconstruction, incisions completely healed.  Small unchanged nodule at the right upper outer implant.  No palpable mass or nodularity in either breast or axilla that I could appreciate.   CBC    Component Value Date/Time   WBC 4.9 08/19/2022 0911   WBC 5.2 02/17/2022 0910   RBC 4.06 08/19/2022 0911   HGB 13.1 08/19/2022 0911   HCT 35.6 (L) 08/19/2022 0911   PLT 176 08/19/2022 0911   MCV 87.7 08/19/2022 0911   MCH 32.3 08/19/2022 0911   MCHC 36.8 (H) 08/19/2022 0911   RDW 12.1 08/19/2022 0911   LYMPHSABS 2.0 08/19/2022 0911   MONOABS 0.3 08/19/2022 0911   EOSABS 0.1 08/19/2022 0911   BASOSABS 0.0 08/19/2022 0911     CMP     Component Value Date/Time   NA 142 08/19/2022 0911   K 3.5 08/19/2022 0911   CL 106 08/19/2022 0911   CO2 30 08/19/2022 0911   GLUCOSE 89 08/19/2022 0911   BUN 14 08/19/2022 0911   CREATININE 0.62 08/19/2022 0911   CALCIUM 9.5 08/19/2022 0911   PROT 7.0 08/19/2022 0911   ALBUMIN 4.4 08/19/2022 0911   AST 15 08/19/2022 0911   ALT 10 08/19/2022 0911   ALKPHOS 80 08/19/2022 0911   BILITOT 0.5 08/19/2022 0911   GFRNONAA >60 08/19/2022 0911   GFRAA >60 03/02/2020 1007     ASSESSMENT & PLAN: 57 yo  post-menpausal female with    1.Malignant neoplasm of upper-inner quadrant of right breast in female, invasive ductal carcinoma, cT2N1M0 stage IB, ER+/PR+/HER2+, G3  -Diagnosed 02/2018, s/p neoadjuvant TCHP, R mastectomy/ALND, and adjuvant radiation. She completed 1 year maintenance herceptin/perjeta in 03/2019 and Neratinib 08/2019 - 2/022. -she started anti-estrogen therapy  12/2018, did not tolerate tamoxifen, anastrozole, letrozole, or exemestane has been off since 06/24/2021 -She has a small nodule in the upper outer quadrant reconstructed right breast, ultrasound showed a benign cyst -Ms. Morgenthaler is doing well from a breast cancer standpoint, exam is benign, labs are unremarkable.  Recent mammogram 07/02/2022 was negative.  Overall no clinical concern for recurrence. -Continue breast cancer surveillance, next visit in 6 months then annually  2. R flank pain, urinary symptoms -Pt has chronic back pains with intermittent flares, and R flank pain while previously on AI which she has stopped since 2022 -For approx 3 months she has R flank pain (none today) and urinary issues (progressive frequency, interrupted stream and RBC+ on UA) -today's exam is normal. Scr WNL.  - I have low suspicion this is related to breast cancer, I recommend urology referral and she accepted.   3. Anxiety, insomnia -takes 0.5 tab xanax PRN to sleep, which helps -I will ask PCP to take over this  4. Osteoporosis -S/p zometa 02/2020 - 08/2021 -DEXA 09/2021 showed improvement from 2021 except slight decrease in BMD at the LFN -off AI since 2022 -Continue calcium and vitamin D  5. RA/Lupus -on Plaquenil per Dr. Estanislado Pandy  6.  Left plantar fasciitis -S/P left plantar fasciectomy 04/17/2022.  continues to have residual left foot pain and attributes this to intermittent left leg swelling   PLAN: -Recent mammogram and today's labs reviewed -Continue breast cancer surveillance -Will ask PCP to take over Xanax  refills -Referral to urology for right flank pain, urinary symptoms, and hematuria  -Breast cancer surveillance visit in 6 months, or sooner if needed  Orders Placed This Encounter  Procedures   Ambulatory referral to Urology    Referral Priority:   Routine    Referral Type:   Consultation    Referral Reason:   Specialty Services Required    Requested Specialty:   Urology    Number of Visits Requested:   1      All questions were answered. The patient knows to call the clinic with any problems, questions or concerns. No barriers to learning were detected. I spent 20 minutes counseling the patient face to face. The total time spent in the appointment was 30 minutes and more than 50% was on counseling, review of test results, and coordination of care.   Cira Rue, NP-C 08/19/2022

## 2022-08-19 ENCOUNTER — Inpatient Hospital Stay (HOSPITAL_BASED_OUTPATIENT_CLINIC_OR_DEPARTMENT_OTHER): Payer: Medicaid Other | Admitting: Nurse Practitioner

## 2022-08-19 ENCOUNTER — Encounter: Payer: Self-pay | Admitting: Physician Assistant

## 2022-08-19 ENCOUNTER — Inpatient Hospital Stay: Payer: Medicaid Other | Attending: Nurse Practitioner

## 2022-08-19 ENCOUNTER — Ambulatory Visit: Payer: Medicaid Other | Attending: Physician Assistant | Admitting: Physician Assistant

## 2022-08-19 ENCOUNTER — Other Ambulatory Visit: Payer: Self-pay

## 2022-08-19 ENCOUNTER — Encounter: Payer: Self-pay | Admitting: Nurse Practitioner

## 2022-08-19 VITALS — BP 124/80 | HR 60 | Temp 97.8°F | Resp 14 | Wt 167.0 lb

## 2022-08-19 VITALS — BP 172/84 | HR 66 | Resp 15 | Ht 64.0 in | Wt 167.4 lb

## 2022-08-19 DIAGNOSIS — R109 Unspecified abdominal pain: Secondary | ICD-10-CM | POA: Insufficient documentation

## 2022-08-19 DIAGNOSIS — Z170 Estrogen receptor positive status [ER+]: Secondary | ICD-10-CM

## 2022-08-19 DIAGNOSIS — M359 Systemic involvement of connective tissue, unspecified: Secondary | ICD-10-CM

## 2022-08-19 DIAGNOSIS — M722 Plantar fascial fibromatosis: Secondary | ICD-10-CM | POA: Diagnosis not present

## 2022-08-19 DIAGNOSIS — M069 Rheumatoid arthritis, unspecified: Secondary | ICD-10-CM | POA: Insufficient documentation

## 2022-08-19 DIAGNOSIS — Z79899 Other long term (current) drug therapy: Secondary | ICD-10-CM | POA: Insufficient documentation

## 2022-08-19 DIAGNOSIS — Z853 Personal history of malignant neoplasm of breast: Secondary | ICD-10-CM | POA: Diagnosis present

## 2022-08-19 DIAGNOSIS — M810 Age-related osteoporosis without current pathological fracture: Secondary | ICD-10-CM

## 2022-08-19 DIAGNOSIS — F419 Anxiety disorder, unspecified: Secondary | ICD-10-CM | POA: Diagnosis not present

## 2022-08-19 DIAGNOSIS — M4156 Other secondary scoliosis, lumbar region: Secondary | ICD-10-CM

## 2022-08-19 DIAGNOSIS — Z8042 Family history of malignant neoplasm of prostate: Secondary | ICD-10-CM

## 2022-08-19 DIAGNOSIS — C50211 Malignant neoplasm of upper-inner quadrant of right female breast: Secondary | ICD-10-CM

## 2022-08-19 DIAGNOSIS — R350 Frequency of micturition: Secondary | ICD-10-CM | POA: Diagnosis not present

## 2022-08-19 DIAGNOSIS — M7989 Other specified soft tissue disorders: Secondary | ICD-10-CM | POA: Diagnosis not present

## 2022-08-19 DIAGNOSIS — Z800 Family history of malignant neoplasm of digestive organs: Secondary | ICD-10-CM

## 2022-08-19 DIAGNOSIS — M5136 Other intervertebral disc degeneration, lumbar region: Secondary | ICD-10-CM

## 2022-08-19 DIAGNOSIS — M19041 Primary osteoarthritis, right hand: Secondary | ICD-10-CM | POA: Diagnosis not present

## 2022-08-19 DIAGNOSIS — M2241 Chondromalacia patellae, right knee: Secondary | ICD-10-CM | POA: Diagnosis not present

## 2022-08-19 DIAGNOSIS — G8929 Other chronic pain: Secondary | ICD-10-CM | POA: Insufficient documentation

## 2022-08-19 DIAGNOSIS — C44519 Basal cell carcinoma of skin of other part of trunk: Secondary | ICD-10-CM

## 2022-08-19 DIAGNOSIS — R319 Hematuria, unspecified: Secondary | ICD-10-CM | POA: Diagnosis not present

## 2022-08-19 DIAGNOSIS — M79672 Pain in left foot: Secondary | ICD-10-CM | POA: Diagnosis not present

## 2022-08-19 DIAGNOSIS — M47812 Spondylosis without myelopathy or radiculopathy, cervical region: Secondary | ICD-10-CM

## 2022-08-19 DIAGNOSIS — R300 Dysuria: Secondary | ICD-10-CM | POA: Insufficient documentation

## 2022-08-19 DIAGNOSIS — Z808 Family history of malignant neoplasm of other organs or systems: Secondary | ICD-10-CM

## 2022-08-19 DIAGNOSIS — Z9011 Acquired absence of right breast and nipple: Secondary | ICD-10-CM

## 2022-08-19 DIAGNOSIS — M19042 Primary osteoarthritis, left hand: Secondary | ICD-10-CM

## 2022-08-19 LAB — CBC WITH DIFFERENTIAL (CANCER CENTER ONLY)
Abs Immature Granulocytes: 0.01 10*3/uL (ref 0.00–0.07)
Basophils Absolute: 0 10*3/uL (ref 0.0–0.1)
Basophils Relative: 1 %
Eosinophils Absolute: 0.1 10*3/uL (ref 0.0–0.5)
Eosinophils Relative: 3 %
HCT: 35.6 % — ABNORMAL LOW (ref 36.0–46.0)
Hemoglobin: 13.1 g/dL (ref 12.0–15.0)
Immature Granulocytes: 0 %
Lymphocytes Relative: 42 %
Lymphs Abs: 2 10*3/uL (ref 0.7–4.0)
MCH: 32.3 pg (ref 26.0–34.0)
MCHC: 36.8 g/dL — ABNORMAL HIGH (ref 30.0–36.0)
MCV: 87.7 fL (ref 80.0–100.0)
Monocytes Absolute: 0.3 10*3/uL (ref 0.1–1.0)
Monocytes Relative: 7 %
Neutro Abs: 2.4 10*3/uL (ref 1.7–7.7)
Neutrophils Relative %: 47 %
Platelet Count: 176 10*3/uL (ref 150–400)
RBC: 4.06 MIL/uL (ref 3.87–5.11)
RDW: 12.1 % (ref 11.5–15.5)
WBC Count: 4.9 10*3/uL (ref 4.0–10.5)
nRBC: 0 % (ref 0.0–0.2)

## 2022-08-19 LAB — CMP (CANCER CENTER ONLY)
ALT: 10 U/L (ref 0–44)
AST: 15 U/L (ref 15–41)
Albumin: 4.4 g/dL (ref 3.5–5.0)
Alkaline Phosphatase: 80 U/L (ref 38–126)
Anion gap: 6 (ref 5–15)
BUN: 14 mg/dL (ref 6–20)
CO2: 30 mmol/L (ref 22–32)
Calcium: 9.5 mg/dL (ref 8.9–10.3)
Chloride: 106 mmol/L (ref 98–111)
Creatinine: 0.62 mg/dL (ref 0.44–1.00)
GFR, Estimated: >60 mL/min (ref >60)
Glucose, Bld: 89 mg/dL (ref 70–99)
Potassium: 3.5 mmol/L (ref 3.5–5.1)
Sodium: 142 mmol/L (ref 135–145)
Total Bilirubin: 0.5 mg/dL (ref 0.3–1.2)
Total Protein: 7 g/dL (ref 6.5–8.1)

## 2022-08-20 ENCOUNTER — Other Ambulatory Visit: Payer: Medicaid Other

## 2022-08-20 ENCOUNTER — Ambulatory Visit: Payer: Medicaid Other | Admitting: Nurse Practitioner

## 2022-08-20 ENCOUNTER — Telehealth: Payer: Self-pay | Admitting: Nurse Practitioner

## 2022-08-20 LAB — CANCER ANTIGEN 27.29: CA 27.29: 18.8 U/mL (ref 0.0–38.6)

## 2022-08-20 NOTE — Telephone Encounter (Signed)
Spoke with patient confirming upcoming appointments  

## 2022-09-03 ENCOUNTER — Other Ambulatory Visit: Payer: Self-pay

## 2022-09-03 DIAGNOSIS — M359 Systemic involvement of connective tissue, unspecified: Secondary | ICD-10-CM

## 2022-09-03 MED ORDER — HYDROXYCHLOROQUINE SULFATE 200 MG PO TABS: ORAL_TABLET | ORAL | 0 refills | Status: DC

## 2022-09-03 NOTE — Telephone Encounter (Signed)
Refill request received via fax from [facility] in [place] for plaquenil.   Next Visit: 10/22/2022  Last Visit: 08/19/2022  Labs: 08/19/2022 HCT 35.6, MCHC 36.8  Eye exam: 07/30/2022    Current Dose per office note on 08/19/2022: Plaquenil '200mg'$  1 tablet by mouth twice daily Monday through Friday.   GP:[id number] connective tissue disease   Last Fill: 05/27/2022  Okay to refill Plaquenil?

## 2022-10-08 NOTE — Progress Notes (Unsigned)
Office Visit Note  Patient: Claire Guzman             Date of Birth: November 13, 1965           MRN: [id number]             PCP: Lujean Amel, MD Referring: Lujean Amel, MD Visit Date: 10/22/2022 Occupation: @GUAROCC @  Subjective:  Left shoulder joint pain    History of Present Illness: Claire Guzman is a 57 y.o. female with history of undifferentiated connective tissue disease.  Patient is currently taking plaquenil 200 mg 1 tablet by mouth twice daily Monday through Friday.  She is tolerating Plaquenil without any side effects and has not missed any doses recently.  She denies any signs or symptoms of a flare since her last office visit.  She states she has had some increased discomfort in the left shoulder over the past 2 to 3 months.  She denies any injury prior to the onset of symptoms.  She has had discomfort when lying on her left side at night and also has pain with range of motion.  She has been taking Advil and Tylenol as needed for pain relief.  She denies any joint swelling.   She denies any symptoms of Raynaud's phenomenon or photosensitivity.  She continues to have chronic fatigue.  She has occasional mouth dryness but has not had any eye dryness.  She denies a malar rash.  She denies any sores in her mouth or nose.    Activities of Daily Living:  Patient reports morning stiffness for 1 hour  Patient Denies nocturnal pain.  Difficulty dressing/grooming: Denies Difficulty climbing stairs: Denies Difficulty getting out of chair: Denies Difficulty using hands for taps, buttons, cutlery, and/or writing: Denies  Review of Systems  Constitutional:  Positive for fatigue.  HENT:  Positive for mouth dryness. Negative for mouth sores and nose dryness.   Eyes:  Negative for pain, visual disturbance and dryness.  Respiratory:  Negative for cough, hemoptysis, shortness of breath and difficulty breathing.   Cardiovascular:  Negative for chest pain, palpitations,  hypertension and swelling in legs/feet.  Gastrointestinal:  Negative for blood in stool, constipation and diarrhea.  Endocrine: Negative for increased urination.  Genitourinary:  Negative for painful urination.  Musculoskeletal:  Positive for joint pain, joint pain, joint swelling and morning stiffness. Negative for myalgias, muscle weakness, muscle tenderness and myalgias.  Skin:  Negative for color change, pallor, rash, hair loss, nodules/bumps, skin tightness, ulcers and sensitivity to sunlight.  Neurological:  Negative for dizziness, numbness, headaches and weakness.  Hematological:  Negative for swollen glands.  Psychiatric/Behavioral:  Positive for sleep disturbance. Negative for depressed mood. The patient is nervous/anxious.     PMFS History:  Patient Active Problem List   Diagnosis Date Noted   Plantar fasciitis 12/12/2021   Heel spur, left 12/12/2021   Headache, acute 08/08/2021   Low back pain 05/14/2021   Acquired absence of right breast 09/22/2018   Port-A-Cath in place 05/11/2018   Genetic testing 04/12/2018   Family history of prostate cancer    Family history of colon cancer    Family history of melanoma    Malignant neoplasm of upper-inner quadrant of right breast in female, estrogen receptor positive ([place]) 03/10/2018    Past Medical History:  Diagnosis Date   Abdominal pain, unspecified site    Anxiety state, unspecified    Complication of anesthesia    convulsions - when she has appendectomy about 12 years ago at  [facility]   Contact dermatitis and other eczema, due to unspecified cause    Essential hypertension, benign    Family history of colon cancer    Family history of melanoma    Family history of prostate cancer    GERD (gastroesophageal reflux disease)    Hematuria, unspecified    Hypertension    Lower back pain    Mixed hyperlipidemia    Rosacea    rt breast ca dx'd 01/2018   R breast cancer   Unspecified symptom associated 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     Family History  Problem Relation Age of Onset   Heart failure Mother    Melanoma Mother 5       has had several removed over the past ten years   Atrial fibrillation Mother    Prostate cancer Father 52   Stroke Father    Throat cancer Father    Heart Problems Maternal Aunt        d.60   Heart Problems Maternal Uncle        d.60   Colon cancer Paternal Aunt        dx 19s   Colon cancer Paternal Uncle        dx 49s   Heart attack Maternal Grandmother    Heart attack Maternal Grandfather        possible cancer, unknown type   Stroke Paternal Grandmother    Colon cancer Paternal Grandfather        dx upper 31s   Healthy Daughter    Past Surgical History:  Procedure Laterality Date   BREAST RECONSTRUCTION WITH PLACEMENT OF TISSUE EXPANDER AND ALLODERM Right 09/14/2018   Procedure: BREAST RECONSTRUCTION WITH PLACEMENT OF TISSUE EXPANDER AND ALLODERM;  Surgeon: Irene Limbo, MD;  Location: [place];  Service: Plastics;  Laterality: Right;   CESAREAN SECTION     DIAGNOSTIC LAPAROSCOPY     EYE SURGERY     Lasik   LAPAROSCOPIC APPENDECTOMY     LIPOSUCTION WITH LIPOFILLING Right 07/26/2019   Procedure: LIPOFILLING from abdomen to R chest;  Surgeon: Irene Limbo, MD;  Location: [facility];  Service: Plastics;  Laterality: Right;   MASTECTOMY WITH RADIOACTIVE SEED GUIDED EXCISION AND AXILLARY SENTINEL LYMPH NODE BIOPSY Right 09/14/2018   Procedure: RIGHT NIPPLE SPARING MASTECTOMY WITH TARGETED DEEP RIGHT AXILLARY LYMPH NODE BIOPSY WITH RADIOACTIVE SEED LOCALIZATION AND RIGHT AXILLARY SENTINEL LYMPH NODE BIOPSY, FROZEN SECTION AND POSSIBLE COMPLETION AXILLARY LYMPH NODE DISSECTION INJECT BLUE DYE RIGHT BREAST;  Surgeon: Fanny Skates, MD;  Location: [facility];  Service: General;  Laterality: Right;   PLANTAR FASCIA SURGERY Left 04/17/2022   PORT-A-CATH REMOVAL Left 07/26/2019   Procedure: REMOVAL PORT-A-CATH;  Surgeon: Irene Limbo, MD;  Location:  [facility];  Service: Plastics;  Laterality: Left;   PORTACATH PLACEMENT Left 04/13/2018   Procedure: INSERTION PORT-A-CATH WITH [place];  Surgeon: Fanny Skates, MD;  Location: [facility];  Service: General;  Laterality: Left;   REMOVAL OF TISSUE EXPANDER AND PLACEMENT OF IMPLANT Right 07/26/2019   Procedure: REMOVAL OF TISSUE EXPANDER AND PLACEMENT OF IMPLANT;  Surgeon: Irene Limbo, MD;  Location: [facility];  Service: Plastics;  Laterality: Right;   UTERINE FIBROID SURGERY     Social History   Social History Narrative   Not on file   Immunization History  Administered Date(s) Administered   Influenza,inj,Quad PF,6+ Mos 05/11/2018, 05/16/2019, 05/04/2020   Moderna Sars-Covid-2 Vaccination 10/13/2019, 11/10/2019, 08/14/2020   Tdap  02/06/2022     Objective: Vital Signs: BP (!) 147/81 (BP Location: Right Arm, Patient Position: Sitting, Cuff Size: Normal)   Pulse 71   Resp 14   Ht 5\' 4"  (1.626 m)   Wt 165 lb (74.8 kg)   LMP 03/28/2018 (Within Days)   BMI 28.32 kg/m    Physical Exam Vitals and nursing note reviewed.  Constitutional:      Appearance: She is well-developed.  HENT:     Head: Normocephalic and atraumatic.  Eyes:     Conjunctiva/sclera: Conjunctivae normal.  Cardiovascular:     Rate and Rhythm: Normal rate and regular rhythm.     Heart sounds: Normal heart sounds.  Pulmonary:     Effort: Pulmonary effort is normal.     Breath sounds: Normal breath sounds.  Abdominal:     General: Bowel sounds are normal.     Palpations: Abdomen is soft.  Musculoskeletal:     Cervical back: Normal range of motion.  Lymphadenopathy:     Cervical: No cervical adenopathy.  Skin:    General: Skin is warm and dry.     Capillary Refill: Capillary refill takes less than 2 seconds.     Comments: Erythema noted around the nailbed of the left great toenail.   Neurological:     Mental Status: She is alert and oriented to person,  place, and time.  Psychiatric:        Behavior: Behavior normal.      Musculoskeletal Exam: C-spine, thoracic spine, lumbar spine have good range of motion.  Shoulder joints, elbow joints, wrist joints, MCPs, PIPs, DIPs have good range of motion with no synovitis.  Complete fist formation bilaterally.  Hip joints have good range of motion with no groin pain.  Knee joints have good range of motion with no warmth or effusion.  Bilateral knee crepitus noted.  Ankle joints have good range of motion with no tenderness or joint swelling.   CDAI Exam: CDAI Score: -- Patient Global: --; Provider Global: -- Swollen: --; Tender: -- Joint Exam 10/22/2022   No joint exam has been documented for this visit   There is currently no information documented on the homunculus. Go to the Rheumatology activity and complete the homunculus joint exam.  Investigation: No additional findings.  Imaging: No results found.  Recent Labs: Lab Results  Component Value Date   WBC 4.9 08/19/2022   HGB 13.1 08/19/2022   PLT 176 08/19/2022   NA 142 08/19/2022   K 3.5 08/19/2022   CL 106 08/19/2022   CO2 30 08/19/2022   GLUCOSE 89 08/19/2022   BUN 14 08/19/2022   CREATININE 0.62 08/19/2022   BILITOT 0.5 08/19/2022   ALKPHOS 80 08/19/2022   AST 15 08/19/2022   ALT 10 08/19/2022   PROT 7.0 08/19/2022   ALBUMIN 4.4 08/19/2022   CALCIUM 9.5 08/19/2022   GFRAA >60 03/02/2020    Speciality Comments: PLQ Eye Exam: 07/30/2022 WNL @ [facility] Follow up 1 year.   Procedures:  No procedures performed Allergies: Trazodone and nefazodone, Metrogel [metronidazole], Other, Paxil [paroxetine hcl], Sulfamethoxazole, Zomig [zolmitriptan], and Prednisone     Assessment / Plan:     Visit Diagnoses: Undifferentiated connective tissue disease ([place]) - - Positive ANA, positive double-stranded DNA, dry mouth, polyarthralgia: Patient was initiated on Plaquenil 200 mg BID M-F in November 2023.  She has been  tolerating Plaquenil without any side effects and has not missed any doses recently.  She continues to experience intermittent arthralgias  and joint stiffness.  No synovitis was noted today.  She has chronic fatigue which has been stable overall.  She has not had any sores in her mouth or nose.  No cervical lymphadenopathy was palpable.  No malar rash was noted.  She continues to have symptoms of chronic dry mouth but has not had any eye dryness.  No parotid swelling or tenderness. Lab work from 05/06/22 and 05/27/22 were reviewed today in the office: ANA 1: 1280 nuclear, fine speckled, 1: 640 nuclear, fine nuclear dots, double-stranded DNA 111, anticardiolipin antibodies and beta-2 glycoprotein antibodies negative, complements within normal limits, Ro antibody negative, La antibody negative, Smith antibody negative, RNP antibody negative, anti-CCP negative, RF negative, sed rate within normal limits.  The following lab work will be updated today for further evaluation.  She will remain on plaquenil as prescribed.  She was advised to notify us if she develops signs or symptoms of a flare.  She will follow-up in the office in 3 months or sooner if needed.  - Plan: CBC with Differential/Platelet, COMPLETE METABOLIC PANEL WITH GFR, Protein / creatinine ratio, urine, ANA, Anti-DNA antibody, double-stranded, C3 and C4, Sedimentation rate, Beta-2 glycoprotein antibodies, Cardiolipin antibodies, IgG, IgM, IgA  High risk medication use - Plaquenil 200mg  1 tablet by mouth twice daily Monday through Friday.  CBC and CMP updated on 08/19/22.  PLQ Eye Exam: 07/30/2022 WNL @ [facility] Follow up 1 year.  - Plan: CBC with Differential/Platelet, COMPLETE METABOLIC PANEL WITH GFR  Chronic left shoulder pain: She has been experiencing increased discomfort in the left shoulder for the past 2 to 3 months.  No injury prior to the onset of symptoms.  She is been experiencing nocturnal pain when lying on her left side  at night and notices having increased discomfort with range of motion.  On examination she has good range of motion with some tenderness anteriorly.  Patient declined x-rays of the left shoulder and a cortisone injection today.  Offered a referral to physical therapy but she would like to try home exercises.  She was given a handout of home exercises to perform.  She plans on using topical agents for pain relief.  She will notify us if her symptoms persist or worsen.  Primary osteoarthritis of both hands: She has PIP and DIP thickening consistent with osteoarthritis of both hands.  No synovitis noted.  Complete fist formation bilaterally.  Chondromalacia patellae, right knee: She has good range of motion of both knee joints on examination today.  Bilateral knee crepitus noted.  No warmth or effusion noted.  Plantar fasciitis - Recent surgery for Planter fasciitis by Dr. Jacqualyn Posey on April 17, 2022.  Surgical scar is healed.  Ingrown toenail of left foot: Left great toenail-encouraged Epsom salt soaks.  A prescription for Bactroban ointment was sent to the pharmacy.  Advised patient to follow-up with PCP or podiatry if her symptoms persist or worsen.  Arthropathy of cervical facet joint: C-spine has slightly limited range of motion.  No symptoms of radiculopathy.  Other secondary scoliosis, lumbar region: Chronic pain.  DDD (degenerative disc disease), lumbar: Chronic pain.  Age-related osteoporosis without current pathological fracture - On Zometa infusions by her oncologist.  Last infusion September 19, 2021.  Other medical conditions are listed as follows:   Malignant neoplasm of upper-inner quadrant of right breast in female, estrogen receptor positive ([place]) - - On Zometa infusions by her oncologist.  Last infusion September 19, 2021.  Acquired absence of right breast  Basal cell carcinoma (BCC) of skin of other part of torso  Family history of melanoma-mother  Family history of colon  cancer-paternal grandfather, paternal aunt, paternal uncle  Family history of prostate cancer-father  Orders: Orders Placed This Encounter  Procedures   CBC with Differential/Platelet   COMPLETE METABOLIC PANEL WITH GFR   Protein / creatinine ratio, urine   ANA   Anti-DNA antibody, double-stranded   C3 and C4   Sedimentation rate   Beta-2 glycoprotein antibodies   Cardiolipin antibodies, IgG, IgM, IgA   Meds ordered this encounter  Medications   mupirocin ointment (BACTROBAN) 2 %    Sig: Apply 1 Application topically 2 (two) times daily.    Dispense:  22 g    Refill:  0    Follow-Up Instructions: Return in about 3 months (around 01/22/2023) for Undifferentiated connective tissue disease.   Ofilia Neas, PA-C  Note - This record has been created using Dragon software.  Chart creation errors have been sought, but may not always  have been located. Such creation errors do not reflect on  the standard of medical care.

## 2022-10-22 ENCOUNTER — Ambulatory Visit: Payer: Medicaid Other | Attending: Rheumatology | Admitting: Physician Assistant

## 2022-10-22 ENCOUNTER — Encounter: Payer: Self-pay | Admitting: Physician Assistant

## 2022-10-22 VITALS — BP 147/81 | HR 71 | Resp 14 | Ht 64.0 in | Wt 165.0 lb

## 2022-10-22 DIAGNOSIS — Z800 Family history of malignant neoplasm of digestive organs: Secondary | ICD-10-CM

## 2022-10-22 DIAGNOSIS — M19041 Primary osteoarthritis, right hand: Secondary | ICD-10-CM | POA: Diagnosis not present

## 2022-10-22 DIAGNOSIS — M722 Plantar fascial fibromatosis: Secondary | ICD-10-CM

## 2022-10-22 DIAGNOSIS — M359 Systemic involvement of connective tissue, unspecified: Secondary | ICD-10-CM | POA: Diagnosis not present

## 2022-10-22 DIAGNOSIS — M25512 Pain in left shoulder: Secondary | ICD-10-CM | POA: Diagnosis not present

## 2022-10-22 DIAGNOSIS — G8929 Other chronic pain: Secondary | ICD-10-CM

## 2022-10-22 DIAGNOSIS — M19042 Primary osteoarthritis, left hand: Secondary | ICD-10-CM

## 2022-10-22 DIAGNOSIS — Z9011 Acquired absence of right breast and nipple: Secondary | ICD-10-CM

## 2022-10-22 DIAGNOSIS — C44519 Basal cell carcinoma of skin of other part of trunk: Secondary | ICD-10-CM

## 2022-10-22 DIAGNOSIS — Z79899 Other long term (current) drug therapy: Secondary | ICD-10-CM

## 2022-10-22 DIAGNOSIS — M5136 Other intervertebral disc degeneration, lumbar region: Secondary | ICD-10-CM

## 2022-10-22 DIAGNOSIS — Z8042 Family history of malignant neoplasm of prostate: Secondary | ICD-10-CM

## 2022-10-22 DIAGNOSIS — C50211 Malignant neoplasm of upper-inner quadrant of right female breast: Secondary | ICD-10-CM

## 2022-10-22 DIAGNOSIS — Z170 Estrogen receptor positive status [ER+]: Secondary | ICD-10-CM

## 2022-10-22 DIAGNOSIS — Z808 Family history of malignant neoplasm of other organs or systems: Secondary | ICD-10-CM

## 2022-10-22 DIAGNOSIS — M2241 Chondromalacia patellae, right knee: Secondary | ICD-10-CM

## 2022-10-22 DIAGNOSIS — M810 Age-related osteoporosis without current pathological fracture: Secondary | ICD-10-CM

## 2022-10-22 DIAGNOSIS — M47812 Spondylosis without myelopathy or radiculopathy, cervical region: Secondary | ICD-10-CM

## 2022-10-22 DIAGNOSIS — L600 Ingrowing nail: Secondary | ICD-10-CM

## 2022-10-22 DIAGNOSIS — M4156 Other secondary scoliosis, lumbar region: Secondary | ICD-10-CM

## 2022-10-22 MED ORDER — MUPIROCIN 2 % EX OINT: 1.0000 | TOPICAL_OINTMENT | Freq: Two times a day (BID) | CUTANEOUS | 0 refills | Status: DC

## 2022-10-22 NOTE — Patient Instructions (Signed)
Shoulder Exercises Ask your health care provider which exercises are safe for you. Do exercises exactly as told by your health care provider and adjust them as directed. It is normal to feel mild stretching, pulling, tightness, or discomfort as you do these exercises. Stop right away if you feel sudden pain or your pain gets worse. Do not begin these exercises until told by your health care provider. Stretching exercises External rotation and abduction This exercise is sometimes called corner stretch. The exercise rotates your arm outward (external rotation) and moves your arm out from your body (abduction). Stand in a doorway with one of your feet slightly in front of the other. This is called a staggered stance. If you cannot reach your forearms to the door frame, stand facing a corner of a room. Choose one of the following positions as told by your health care provider: Place your hands and forearms on the door frame above your head. Place your hands and forearms on the door frame at the height of your head. Place your hands on the door frame at the height of your elbows. Slowly move your weight onto your front foot until you feel a stretch across your chest and in the front of your shoulders. Keep your head and chest upright and keep your abdominal muscles tight. Hold for __________ seconds. To release the stretch, shift your weight to your back foot. Repeat __________ times. Complete this exercise __________ times a day. Extension, standing  Stand and hold a broomstick, a cane, or a similar object behind your back. Your hands should be a little wider than shoulder-width apart. Your palms should face away from your back. Keeping your elbows straight and your shoulder muscles relaxed, move the stick away from your body until you feel a stretch in your shoulders (extension). Avoid shrugging your shoulders while you move the stick. Keep your shoulder blades tucked down toward the middle of your  back. Hold for __________ seconds. Slowly return to the starting position. Repeat __________ times. Complete this exercise __________ times a day. Range-of-motion exercises Pendulum  Stand near a wall or a surface that you can hold onto for balance. Bend at the waist and let your left / right arm hang straight down. Use your other arm to support you. Keep your back straight and do not lock your knees. Relax your left / right arm and shoulder muscles, and move your hips and your trunk so your left / right arm swings freely. Your arm should swing because of the motion of your body, not because you are using your arm or shoulder muscles. Keep moving your hips and trunk so your arm swings in the following directions, as told by your health care provider: Side to side. Forward and backward. In clockwise and counterclockwise circles. Continue each motion for __________ seconds, or for as long as told by your health care provider. Slowly return to the starting position. Repeat __________ times. Complete this exercise __________ times a day. Shoulder flexion, standing  Stand and hold a broomstick, a cane, or a similar object. Place your hands a little more than shoulder-width apart on the object. Your left / right hand should be palm-up, and your other hand should be palm-down. Keep your elbow straight and your shoulder muscles relaxed. Push the stick up with your healthy arm to raise your left / right arm in front of your body, and then over your head until you feel a stretch in your shoulder (flexion). Avoid shrugging your shoulder   while you raise your arm. Keep your shoulder blade tucked down toward the middle of your back. Hold for __________ seconds. Slowly return to the starting position. Repeat __________ times. Complete this exercise __________ times a day. Shoulder abduction, standing  Stand and hold a broomstick, a cane, or a similar object. Place your hands a little more than  shoulder-width apart on the object. Your left / right hand should be palm-up, and your other hand should be palm-down. Keep your elbow straight and your shoulder muscles relaxed. Push the object across your body toward your left / right side. Raise your left / right arm to the side of your body (abduction) until you feel a stretch in your shoulder. Do not raise your arm above shoulder height unless your health care provider tells you to do that. If directed, raise your arm over your head. Avoid shrugging your shoulder while you raise your arm. Keep your shoulder blade tucked down toward the middle of your back. Hold for __________ seconds. Slowly return to the starting position. Repeat __________ times. Complete this exercise __________ times a day. Internal rotation  Place your left / right hand behind your back, palm-up. Use your other hand to dangle an exercise band, a broomstick, or a similar object over your shoulder. Grasp the band with your left / right hand so you are holding on to both ends. Gently pull up on the band until you feel a stretch in the front of your left / right shoulder. The movement of your arm toward the center of your body is called internal rotation. Avoid shrugging your shoulder while you raise your arm. Keep your shoulder blade tucked down toward the middle of your back. Hold for __________ seconds. Release the stretch by letting go of the band and lowering your hands. Repeat __________ times. Complete this exercise __________ times a day. Strengthening exercises External rotation  Sit in a stable chair without armrests. Secure an exercise band to a stable object at elbow height on your left / right side. Place a soft object, such as a folded towel or a small pillow, between your left / right upper arm and your body to move your elbow about 4 inches (10 cm) away from your side. Hold the end of the exercise band so it is tight and there is no slack. Keeping your  elbow pressed against the soft object, slowly move your forearm out, away from your abdomen (external rotation). Keep your body steady so only your forearm moves. Hold for __________ seconds. Slowly return to the starting position. Repeat __________ times. Complete this exercise __________ times a day. Shoulder abduction  Sit in a stable chair without armrests, or stand up. Hold a __________ lb / kg weight in your left / right hand, or hold an exercise band with both hands. Start with your arms straight down and your left / right palm facing in, toward your body. Slowly lift your left / right hand out to your side (abduction). Do not lift your hand above shoulder height unless your health care provider tells you that this is safe. Keep your arms straight. Avoid shrugging your shoulder while you do this movement. Keep your shoulder blade tucked down toward the middle of your back. Hold for __________ seconds. Slowly lower your arm, and return to the starting position. Repeat __________ times. Complete this exercise __________ times a day. Shoulder extension  Sit in a stable chair without armrests, or stand up. Secure an exercise band to a   stable object in front of you so it is at shoulder height. Hold one end of the exercise band in each hand. Straighten your elbows and lift your hands up to shoulder height. Squeeze your shoulder blades together as you pull your hands down to the sides of your thighs (extension). Stop when your hands are straight down by your sides. Do not let your hands go behind your body. Hold for __________ seconds. Slowly return to the starting position. Repeat __________ times. Complete this exercise __________ times a day. Shoulder row  Sit in a stable chair without armrests, or stand up. Secure an exercise band to a stable object in front of you so it is at chest height. Hold one end of the exercise band in each hand. Position your palms so that your thumbs are  facing the ceiling (neutral position). Bend each of your elbows to a 90-degree angle (right angle) and keep your upper arms at your sides. Step back or move the chair back until the band is tight and there is no slack. Slowly pull your elbows back behind you. Hold for __________ seconds. Slowly return to the starting position. Repeat __________ times. Complete this exercise __________ times a day. Shoulder press-ups  Sit in a stable chair that has armrests. Sit upright, with your feet flat on the floor. Put your hands on the armrests so your elbows are bent and your fingers are pointing forward. Your hands should be about even with the sides of your body. Push down on the armrests and use your arms to lift yourself off the chair. Straighten your elbows and lift yourself up as much as you comfortably can. Move your shoulder blades down, and avoid letting your shoulders move up toward your ears. Keep your feet on the ground. As you get stronger, your feet should support less of your body weight as you lift yourself up. Hold for __________ seconds. Slowly lower yourself back into the chair. Repeat __________ times. Complete this exercise __________ times a day. Wall push-ups  Stand so you are facing a stable wall. Your feet should be about one arm-length away from the wall. Lean forward and place your palms on the wall at shoulder height. Keep your feet flat on the floor as you bend your elbows and lean forward toward the wall. Hold for __________ seconds. Straighten your elbows to push yourself back to the starting position. Repeat __________ times. Complete this exercise __________ times a day.  Shoulder Range of Motion Exercises Shoulder range of motion (ROM) exercises are done to keep the shoulder moving freely or to increase movement. They are recommended for people who have shoulder pain or stiffness or who are recovering from a shoulder surgery. Ask your health care provider which  exercises are safe for you. Do exercises exactly as told by your health care provider and adjust them as directed. It is normal to feel mild stretching, pulling, tightness, or discomfort as you do these exercises. Stop right away if you feel sudden pain or your pain gets worse. Do not begin these exercises until told by your health care provider. Phase 1 exercise When you are able, do this exercise 1-2 times a day for 30-60 seconds in each direction, or as directed by your health care provider. Pendulum exercise     To do this exercise while sitting: Sit in a chair or at the edge of your bed with your feet flat on the floor. Let your affected arm hang down in front of  you over the edge of the bed or chair. Relax your shoulder, arm, and hand. Rock your body so your arm gently swings in small circles. You can also use your unaffected arm to start the motion. Repeat, changing the direction of the circles, swinging your arm left and right, and swinging your arm forward and back. To do this exercise while standing: Stand next to a sturdy chair or table, and hold on to it with your hand on your unaffected side. Bend forward at the waist. Bend your knees slightly. Relax your shoulder, arm, and hand. While keeping your shoulder relaxed, use body motion to swing your arm in small circles. Repeat, changing the direction of the circles, swinging your arm left and right, and swinging your arm forward and back. Between exercises, stand up tall and take a short break to relax your lower back.  Phase 2 exercises Do these exercises 1-2 times a day or as told by your health care provider. Hold each stretch for 30 seconds, and repeat 3 times. Do the exercises with one or both arms as instructed by your health care provider. For these exercises, sit at a table with your hand and arm supported by the table. A chair that slides easily or has wheels can be helpful. External rotation  Turn your chair so that  your affected side is nearest to the table. Place your forearm on the table to your side. Bend your arm to about a 90-degree angle (right angle) at the elbow, and place your hand palm-down on the table. Your elbow should be about 6 inches (15 cm) away from your side. Keeping your arm on the table, lean your body forward. Abduction  Turn your chair so that your affected side is nearest to the table. Place your forearm and hand on the table so that your thumb points toward the ceiling and your arm is straight out to your side. Slide your hand out to the side and away from you. To increase the stretch, you can slide your chair away from the table. Flexion: forward stretch  Sit facing the table. Place your hand and elbow on the table in front of you. Slide your hand forward and away from you, using your unaffected arm to do the work. To increase the stretch, you can slide your chair backward. Phase 3 exercises Do these exercises 1-2 times a day or as told by your health care provider. Hold each stretch for 30 seconds, and repeat 3 times. Do the exercises with one or both arms as instructed by your health care provider. You will need a cane, a piece of PVC pipe, or a sturdy wooden dowel for the wand exercises. Cross-body stretch: posterior capsule stretch  Lift your arm straight out in front of you. Bend your arm in a 90-degree angle (right angle) at the elbow so your forearm moves across your body. Use your other arm to gently pull the elbow across your body, toward your other shoulder. Wall climbs  Stand with your affected arm extended out to the side with your hand resting on a door frame. Slide your hand slowly up the door frame. To increase the stretch, step through the door frame. Keep your body upright and do not lean. Flexion     To do this exercise while standing: Hold the wand with both of your hands, palms-down. Lift the wand up and over your head, if able. Lift mostly with  your affected arm, and use the other arm to  help. Push upward with your other arm to gently increase the stretch. To do this exercise while lying down: Lie on your back with your elbows resting on the floor and the wand in both your hands. Your hands will be palm-down, or pointing toward your feet. Lift your hands toward the ceiling, using your unaffected arm to help if needed. Bring your arms overhead as able, using your unaffected arm to help if needed.  Internal rotation  Stand while holding the wand behind you with both hands. Your unaffected arm should be extended above your head with the arm of the affected side extended behind you at the level of your waist. The wand should be pointing straight up and down as you hold it. Slowly pull the wand up behind your back by straightening the elbow of your unaffected arm and bending the elbow of your affected arm. External rotation  Lie on your back with your affected upper arm supported on a small pillow or rolled towel. When you first do this exercise, keep your upper arm close to your body. Over time, bring your arm up to a 90-degree angle (right angle) out to the side. Hold the wand across your stomach and with both hands palm-up. Your elbow on your affected side should be bent at a 90-degree angle. Use your unaffected side to help push your forearm away from you and toward the floor. Keep your elbow on your affected side bent at a 90-degree angle. This information is not intended to replace advice given to you by your health care provider. Make sure you discuss any questions you have with your health care provider. Document Revised: 09/03/2021 Document Reviewed: 09/03/2021 [employer] Patient Education  [place].

## 2022-10-22 NOTE — Progress Notes (Signed)
Potassium is borderline low-3.3. rest of CMP WNL.  CBC WNL. ESR WNL.

## 2022-10-23 NOTE — Progress Notes (Signed)
Complements WNL

## 2022-10-23 NOTE — Progress Notes (Signed)
No proteinuria

## 2022-10-24 NOTE — Progress Notes (Signed)
dsDNA is elevated and has trended up-140--please clarify if she is taking plaquenil as prescribed?

## 2022-10-27 ENCOUNTER — Other Ambulatory Visit: Payer: Self-pay | Admitting: Nurse Practitioner

## 2022-10-27 DIAGNOSIS — Z170 Estrogen receptor positive status [ER+]: Secondary | ICD-10-CM

## 2022-10-27 NOTE — Progress Notes (Signed)
ANA remains positive.

## 2022-10-27 NOTE — Telephone Encounter (Signed)
It appears that Dr. Burr Medico (oncologist) was previously prescribing oral potassium for the patient.  We typically do not prescribe oral potassium/follow hypokalemia. We can forward results on to Dr. Burr Medico.  Sorry for the inconvenience.

## 2022-10-29 LAB — CARDIOLIPIN ANTIBODIES, IGG, IGM, IGA
Anticardiolipin IgA: <2 APL-U/mL (ref <20.0)
Anticardiolipin IgG: <2 GPL-U/mL (ref <20.0)
Anticardiolipin IgM: 3.2 MPL-U/mL (ref <20.0)

## 2022-10-29 LAB — CBC WITH DIFFERENTIAL/PLATELET
Absolute Monocytes: 261 cells/uL (ref 200–950)
Basophils Absolute: 18 cells/uL (ref 0–200)
Basophils Relative: 0.4 %
Eosinophils Absolute: 122 cells/uL (ref 15–500)
Eosinophils Relative: 2.7 %
HCT: 38.5 % (ref 35.0–45.0)
Hemoglobin: 13.4 g/dL (ref 11.7–15.5)
Lymphs Abs: 1787 cells/uL (ref 850–3900)
MCH: 31.5 pg (ref 27.0–33.0)
MCHC: 34.8 g/dL (ref 32.0–36.0)
MCV: 90.4 fL (ref 80.0–100.0)
MPV: 10.5 fL (ref 7.5–12.5)
Monocytes Relative: 5.8 %
Neutro Abs: 2313 cells/uL (ref 1500–7800)
Neutrophils Relative %: 51.4 %
Platelets: 210 10*3/uL (ref 140–400)
RBC: 4.26 10*6/uL (ref 3.80–5.10)
RDW: 12.8 % (ref 11.0–15.0)
Total Lymphocyte: 39.7 %
WBC: 4.5 10*3/uL (ref 3.8–10.8)

## 2022-10-29 LAB — COMPLETE METABOLIC PANEL WITH GFR
AG Ratio: 1.8 (calc) (ref 1.0–2.5)
ALT: 13 U/L (ref 6–29)
AST: 18 U/L (ref 10–35)
Albumin: 4.8 g/dL (ref 3.6–5.1)
Alkaline phosphatase (APISO): 78 U/L (ref 37–153)
BUN: 11 mg/dL (ref 7–25)
CO2: 30 mmol/L (ref 20–32)
Calcium: 9.4 mg/dL (ref 8.6–10.4)
Chloride: 106 mmol/L (ref 98–110)
Creat: 0.58 mg/dL (ref 0.50–1.03)
Globulin: 2.7 g/dL (calc) (ref 1.9–3.7)
Glucose, Bld: 100 mg/dL — ABNORMAL HIGH (ref 65–99)
Potassium: 3.3 mmol/L — ABNORMAL LOW (ref 3.5–5.3)
Sodium: 143 mmol/L (ref 135–146)
Total Bilirubin: 0.6 mg/dL (ref 0.2–1.2)
Total Protein: 7.5 g/dL (ref 6.1–8.1)
eGFR: 107 mL/min/{1.73_m2} (ref >=60)

## 2022-10-29 LAB — ANTI-NUCLEAR AB-TITER (ANA TITER)
ANA TITER: >=1:1280 {titer} — AB
ANA Titer 1: 1:1280 {titer} — ABNORMAL HIGH

## 2022-10-29 LAB — ANA: Anti Nuclear Antibody (ANA): POSITIVE — AB

## 2022-10-29 LAB — PROTEIN / CREATININE RATIO, URINE
Creatinine, Urine: 42 mg/dL (ref 20–275)
Protein/Creat Ratio: 71 mg/g creat (ref 24–184)
Protein/Creatinine Ratio: 0.071 mg/mg creat (ref 0.024–0.184)
Total Protein, Urine: 3 mg/dL — ABNORMAL LOW (ref 5–24)

## 2022-10-29 LAB — BETA-2 GLYCOPROTEIN ANTIBODIES
Beta-2 Glyco 1 IgA: <2 U/mL (ref <20.0)
Beta-2 Glyco 1 IgM: 3.9 U/mL (ref <20.0)
Beta-2 Glyco I IgG: <2 U/mL (ref <20.0)

## 2022-10-29 LAB — C3 AND C4
C3 Complement: 144 mg/dL (ref 83–193)
C4 Complement: 25 mg/dL (ref 15–57)

## 2022-10-29 LAB — ANTI-DNA ANTIBODY, DOUBLE-STRANDED: ds DNA Ab: 140 IU/mL — ABNORMAL HIGH

## 2022-10-29 LAB — SEDIMENTATION RATE: Sed Rate: 17 mm/h (ref 0–30)

## 2022-10-29 NOTE — Progress Notes (Signed)
Anticardiolipin antibodies and beta-2 glycoprotein antibodies negative.

## 2022-11-12 ENCOUNTER — Encounter (HOSPITAL_BASED_OUTPATIENT_CLINIC_OR_DEPARTMENT_OTHER): Payer: Self-pay | Admitting: Emergency Medicine

## 2022-11-12 ENCOUNTER — Emergency Department (HOSPITAL_BASED_OUTPATIENT_CLINIC_OR_DEPARTMENT_OTHER): Payer: Medicaid Other | Admitting: Radiology

## 2022-11-12 ENCOUNTER — Observation Stay (HOSPITAL_BASED_OUTPATIENT_CLINIC_OR_DEPARTMENT_OTHER)
Admission: EM | Admit: 2022-11-12 | Discharge: 2022-11-13 | Disposition: A | Discharge status: Home / Self Care | Payer: Medicaid Other | Attending: Internal Medicine | Admitting: Internal Medicine

## 2022-11-12 ENCOUNTER — Other Ambulatory Visit (HOSPITAL_BASED_OUTPATIENT_CLINIC_OR_DEPARTMENT_OTHER): Payer: Self-pay

## 2022-11-12 ENCOUNTER — Other Ambulatory Visit: Payer: Self-pay

## 2022-11-12 ENCOUNTER — Emergency Department (HOSPITAL_BASED_OUTPATIENT_CLINIC_OR_DEPARTMENT_OTHER): Payer: Medicaid Other

## 2022-11-12 ENCOUNTER — Encounter: Payer: Self-pay | Admitting: Hematology

## 2022-11-12 DIAGNOSIS — R0789 Other chest pain: Secondary | ICD-10-CM | POA: Diagnosis present

## 2022-11-12 DIAGNOSIS — I200 Unstable angina: Secondary | ICD-10-CM | POA: Insufficient documentation

## 2022-11-12 DIAGNOSIS — F411 Generalized anxiety disorder: Secondary | ICD-10-CM | POA: Diagnosis not present

## 2022-11-12 DIAGNOSIS — Z853 Personal history of malignant neoplasm of breast: Secondary | ICD-10-CM | POA: Diagnosis not present

## 2022-11-12 DIAGNOSIS — I10 Essential (primary) hypertension: Secondary | ICD-10-CM | POA: Diagnosis present

## 2022-11-12 DIAGNOSIS — R079 Chest pain, unspecified: Secondary | ICD-10-CM | POA: Diagnosis present

## 2022-11-12 DIAGNOSIS — Z79899 Other long term (current) drug therapy: Secondary | ICD-10-CM | POA: Diagnosis not present

## 2022-11-12 DIAGNOSIS — R7989 Other specified abnormal findings of blood chemistry: Secondary | ICD-10-CM | POA: Diagnosis not present

## 2022-11-12 DIAGNOSIS — K219 Gastro-esophageal reflux disease without esophagitis: Secondary | ICD-10-CM | POA: Diagnosis present

## 2022-11-12 LAB — CBC
HCT: 34.6 % — ABNORMAL LOW (ref 36.0–46.0)
Hemoglobin: 12.9 g/dL (ref 12.0–15.0)
MCH: 32.3 pg (ref 26.0–34.0)
MCHC: 37.3 g/dL — ABNORMAL HIGH (ref 30.0–36.0)
MCV: 86.7 fL (ref 80.0–100.0)
Platelets: 170 10*3/uL (ref 150–400)
RBC: 3.99 MIL/uL (ref 3.87–5.11)
RDW: 12 % (ref 11.5–15.5)
WBC: 5.5 10*3/uL (ref 4.0–10.5)
nRBC: 0 % (ref 0.0–0.2)

## 2022-11-12 LAB — BASIC METABOLIC PANEL
Anion gap: 11 (ref 5–15)
BUN: 9 mg/dL (ref 6–20)
CO2: 25 mmol/L (ref 22–32)
Calcium: 9.5 mg/dL (ref 8.9–10.3)
Chloride: 103 mmol/L (ref 98–111)
Creatinine, Ser: 0.58 mg/dL (ref 0.44–1.00)
GFR, Estimated: >60 mL/min (ref >60)
Glucose, Bld: 102 mg/dL — ABNORMAL HIGH (ref 70–99)
Potassium: 3.5 mmol/L (ref 3.5–5.1)
Sodium: 139 mmol/L (ref 135–145)

## 2022-11-12 LAB — TROPONIN I (HIGH SENSITIVITY)
Troponin I (High Sensitivity): 9 ng/L (ref <18)
Troponin I (High Sensitivity): 26 ng/L — ABNORMAL HIGH (ref <18)
Troponin I (High Sensitivity): 38 ng/L — ABNORMAL HIGH (ref <18)

## 2022-11-12 LAB — D-DIMER, QUANTITATIVE: D-Dimer, Quant: 0.43 ug/mL-FEU (ref 0.00–0.50)

## 2022-11-12 MED ORDER — ACETAMINOPHEN 325 MG PO TABS
650.0000 mg | ORAL_TABLET | Freq: Four times a day (QID) | ORAL | Status: DC | PRN
  Administered 2022-11-13 (×2): 650 mg via ORAL
  Filled 2022-11-12: qty 2

## 2022-11-12 MED ORDER — NITROGLYCERIN 0.4 MG SL SUBL: 0.4000 mg | SUBLINGUAL_TABLET | SUBLINGUAL | Status: DC | PRN

## 2022-11-12 MED ORDER — ONDANSETRON HCL 4 MG/2ML IJ SOLN: 4.0000 mg | Freq: Four times a day (QID) | INTRAMUSCULAR | Status: DC | PRN

## 2022-11-12 MED ORDER — MELATONIN 3 MG PO TABS
3.0000 mg | ORAL_TABLET | Freq: Every evening | ORAL | Status: DC | PRN
  Administered 2022-11-13: 3 mg via ORAL
  Filled 2022-11-12: qty 1

## 2022-11-12 MED ORDER — ALPRAZOLAM 0.25 MG PO TABS
0.2500 mg | ORAL_TABLET | Freq: Every evening | ORAL | Status: DC | PRN
  Administered 2022-11-13: 0.25 mg via ORAL
  Filled 2022-11-12: qty 1

## 2022-11-12 MED ORDER — ACETAMINOPHEN 650 MG RE SUPP: 650.0000 mg | Freq: Four times a day (QID) | RECTAL | Status: DC | PRN

## 2022-11-12 NOTE — ED Notes (Signed)
Pt given smart popcorn and chips and coke.

## 2022-11-12 NOTE — Progress Notes (Signed)
Patient arrived via transport.   Admission team paged for evaluation.

## 2022-11-12 NOTE — ED Provider Notes (Signed)
[place] EMERGENCY DEPARTMENT AT [facility] Provider Note   CSN: [id number] Arrival date & time: 11/12/22  9563     History  No chief complaint on file.   Claire Guzman is a 57 y.o. female with a past medical history of GERD, hypertension, right breast cancer status post mastectomy, connective tissue disease presents today for evaluation of chest pain.  Patient reports she started to have chest pain this morning after waking up.  Pain is in the center of the chest, constant, pressure-like, radiating to her left shoulder and left arm.  Patient also reports "feeling different" on the left side of the face around 9 AM.  She denies any shortness of breath.  No personal or family history of DVT/PE.  History of breast cancer status post mastectomy 2020.  No recent travel.  Not on any active cancer treatment.  Denies any fever, nausea or vomiting, rash, bowel changes, urinary symptoms.  HPI    Past Medical History:  Diagnosis Date   Abdominal pain, unspecified site    Anxiety state, unspecified    Complication of anesthesia    convulsions - when she has appendectomy about 12 years ago at [facility]   Contact dermatitis and other eczema, due to unspecified cause    Essential hypertension, benign    Family history of colon cancer    Family history of melanoma    Family history of prostate cancer    GERD (gastroesophageal reflux disease)    Hematuria, unspecified    Hypertension    Lower back pain    Mixed hyperlipidemia    Rosacea    rt breast ca dx'd 01/2018   R breast cancer   Unspecified symptom associated with female genital organs    Past Surgical History:  Procedure Laterality Date   BREAST RECONSTRUCTION WITH PLACEMENT OF TISSUE EXPANDER AND ALLODERM Right 09/14/2018   Procedure: BREAST RECONSTRUCTION WITH PLACEMENT OF TISSUE EXPANDER AND ALLODERM;  Surgeon: Glenna Fellows, MD;  Location: [facility] OR;  Service: Plastics;  Laterality: Right;   CESAREAN SECTION      DIAGNOSTIC LAPAROSCOPY     EYE SURGERY     Lasik   LAPAROSCOPIC APPENDECTOMY     LIPOSUCTION WITH LIPOFILLING Right 07/26/2019   Procedure: LIPOFILLING from abdomen to R chest;  Surgeon: Glenna Fellows, MD;  Location: [facility];  Service: Plastics;  Laterality: Right;   MASTECTOMY WITH RADIOACTIVE SEED GUIDED EXCISION AND AXILLARY SENTINEL LYMPH NODE BIOPSY Right 09/14/2018   Procedure: RIGHT NIPPLE SPARING MASTECTOMY WITH TARGETED DEEP RIGHT AXILLARY LYMPH NODE BIOPSY WITH RADIOACTIVE SEED LOCALIZATION AND RIGHT AXILLARY SENTINEL LYMPH NODE BIOPSY, FROZEN SECTION AND POSSIBLE COMPLETION AXILLARY LYMPH NODE DISSECTION INJECT BLUE DYE RIGHT BREAST;  Surgeon: Claud Kelp, MD;  Location: [facility] OR;  Service: General;  Laterality: Right;   PLANTAR FASCIA SURGERY Left 04/17/2022   PORT-A-CATH REMOVAL Left 07/26/2019   Procedure: REMOVAL PORT-A-CATH;  Surgeon: Glenna Fellows, MD;  Location: [facility];  Service: Plastics;  Laterality: Left;   PORTACATH PLACEMENT Left 04/13/2018   Procedure: INSERTION PORT-A-CATH WITH [place];  Surgeon: Claud Kelp, MD;  Location: [facility];  Service: General;  Laterality: Left;   REMOVAL OF TISSUE EXPANDER AND PLACEMENT OF IMPLANT Right 07/26/2019   Procedure: REMOVAL OF TISSUE EXPANDER AND PLACEMENT OF IMPLANT;  Surgeon: Glenna Fellows, MD;  Location: [facility];  Service: Plastics;  Laterality: Right;   UTERINE FIBROID SURGERY       Home Medications Prior  to Admission medications   Medication Sig Start Date End Date Taking? Authorizing Provider  potassium chloride SA (KLOR-CON M) 20 MEQ tablet TAKE 1 TABLET(20 MEQ) BY MOUTH DAILY 10/27/22   Pollyann Samples, NP  ALPRAZolam Prudy Feeler) 0.5 MG tablet TAKE 1 TABLET(0.5 MG) BY MOUTH TWICE DAILY AS NEEDED FOR ANXIETY 09/27/21   Malachy Mood, MD  Ascorbic Acid (VITAMIN C) 1000 MG tablet Take 2,000 mg by mouth daily. 08/28/22   [provider]   atenolol (TENORMIN) 50 MG tablet Take 50 mg by mouth at bedtime.    [provider]  Cholecalciferol (VITAMIN D3) 50 MCG (2000 UT) capsule Take 2,000 Units by mouth daily. 05/13/20   [provider]  hydroxychloroquine (PLAQUENIL) 200 MG tablet Take 1 tablet 200 mg BID Monday-Friday 09/03/22   Gearldine Bienenstock, PA-C  ibuprofen (ADVIL) 800 MG tablet Take 1 tablet (800 mg total) by mouth every 8 (eight) hours as needed. 08/02/21   Malachy Mood, MD  lactulose (CHRONULAC) 10 GM/15ML solution TAKE 20 MLS BY MOUTH EVERY 4 HOURS AS NEEDED FOR CONSTIPATION Patient not taking: Reported on 10/22/2022 03/30/21   Malachy Mood, MD  methocarbamol (ROBAXIN) 750 MG tablet Take 1 tablet (750 mg total) by mouth 3 (three) times daily as needed (muscle spasm/pain). Patient not taking: Reported on 10/22/2022 02/06/22   Cathren Laine, MD  mupirocin ointment (BACTROBAN) 2 % Apply 1 Application topically 2 (two) times daily. 10/22/22   Gearldine Bienenstock, PA-C  pantoprazole (PROTONIX) 40 MG tablet Take by mouth. 09/03/22   [provider]  traMADol (ULTRAM) 50 MG tablet TAKE 1 TABLET BY MOUTH EVERY 12 HOURS AS NEEDED Patient not taking: Reported on 08/19/2022 12/17/21   Vivi Barrack, DPM  valACYclovir (VALTREX) 500 MG tablet valacyclovir 500 mg tablet  TAKE 1 TABLET BY MOUTH TWICE DAILY    [provider]  venlafaxine XR (EFFEXOR-XR) 150 MG 24 hr capsule TAKE 1 CAPSULE(150 MG) BY MOUTH DAILY WITH BREAKFAST 02/24/22   Malachy Mood, MD  omeprazole (PRILOSEC) 20 MG capsule Take 1 capsule (20 mg total) by mouth 2 (two) times daily before a meal. Patient not taking: Reported on 01/03/2020 05/03/18 10/30/20  Malachy Mood, MD  prochlorperazine (COMPAZINE) 10 MG tablet TAKE 1 TABLET BY MOUTH EVERY 6 HOURS AS NEEDED FOR NAUSEA OR VOMITING 08/10/18 08/12/18  Malachy Mood, MD      Allergies    Trazodone and nefazodone, Metrogel [metronidazole], Other, Paxil [paroxetine hcl], Sulfamethoxazole, Zomig [zolmitriptan], and Prednisone     Review of Systems   Review of Systems Negative except as per HPI.  Physical Exam Updated Vital Signs LMP 03/28/2018 (Within Days)  Physical Exam Vitals and nursing note reviewed.  Constitutional:      Appearance: Normal appearance.  HENT:     Head: Normocephalic and atraumatic.     Mouth/Throat:     Mouth: Mucous membranes are moist.  Eyes:     General: No scleral icterus. Cardiovascular:     Rate and Rhythm: Normal rate and regular rhythm.     Pulses: Normal pulses.     Heart sounds: Normal heart sounds.  Pulmonary:     Effort: Pulmonary effort is normal.     Breath sounds: Normal breath sounds.  Abdominal:     General: Abdomen is flat.     Palpations: Abdomen is soft.     Tenderness: There is no abdominal tenderness.  Musculoskeletal:        General: No deformity.  Skin:  General: Skin is warm.     Findings: No rash.  Neurological:     General: No focal deficit present.     Mental Status: She is alert.  Psychiatric:        Mood and Affect: Mood normal.     ED Results / Procedures / Treatments   Labs (all labs ordered are listed, but only abnormal results are displayed) Labs Reviewed - No data to display  EKG None  Radiology No results found.  Procedures Procedures    Medications Ordered in ED Medications - No data to display  ED Course/ Medical Decision Making/ A&P                             Medical Decision Making Amount and/or Complexity of Data Reviewed Labs: ordered. Radiology: ordered.  Risk Decision regarding hospitalization.   This patient presents to the ED for chest pain, this involves an extensive number of treatment options, and is a complaint that carries with a high risk of complications and morbidity.  The differential diagnosis includes ACS, pericarditis, PE, pneumothorax, pneumonia, dissection.  This is not an exhaustive list.  Lab tests: I ordered and personally interpreted labs.  The pertinent results include: WBC  unremarkable. Hbg unremarkable. Platelets unremarkable. Electrolytes unremarkable. BUN, creatinine unremarkable. Troponin elevated at 26, 38.  Imaging studies: I ordered imaging studies. I personally reviewed, interpreted imaging and agree with the radiologist's interpretations. The results include: Ultrasound abdomen pelvis showed cholelithiasis.  Problem list/ ED course/ Critical interventions/ Medical management: HPI: See above Vital signs within normal range and stable throughout visit. Laboratory/imaging studies significant for: See above. On physical examination, patient is afebrile and appears in no acute distress.  Patient presenting with acute onset of chest pain started this morning when she was walking to the bathroom.  Troponin elevated at 28 and 38.  EKG without any ischemic changes.  D-dimer 0.43.  Low suspicion for PE.  I called and spoke to cardiology.  They think patient may benefit from admission for further workup and observation.  Cardiology will follow up when patient is transferred.  Patient is stable at the moment.  She states her pain has improved. I have reviewed the patient home medicines and have made adjustments as needed.  Cardiac monitoring/EKG: The patient was maintained on a cardiac monitor.  I personally reviewed and interpreted the cardiac monitor which showed an underlying rhythm of: sinus rhythm.  Additional history obtained: External records from outside source obtained and reviewed including: Chart review including previous notes, labs, imaging.  Consultations obtained: I requested consultation with Dr. Geralynn Rile cardiology, and discussed lab and imaging findings as well as pertinent plan.  He recommended admission and cardiology will see the patient. I requested consultation with Dr. Ella Jubilee, and discussed lab and imaging findings as well as pertinent plan.  He recommended admission..  Disposition Admit.  This chart was dictated using voice recognition  software.  Despite best efforts to proofread,  errors can occur which can change the documentation meaning.          Final Clinical Impression(s) / ED Diagnoses Final diagnoses:  Chest pain, unspecified type    Rx / DC Orders ED Discharge Orders     None         Jeanelle Malling, [place] 11/12/22 1701    Arby Barrette, MD 11/13/22 [phone]

## 2022-11-12 NOTE — H&P (Signed)
History and Physical      Claire Guzman ZOX:[id number] DOB: 1965/10/28 DOA: 11/12/2022  PCP: Medicine, [facility] Family *** Patient coming from: home ***  I have personally briefly reviewed patient's old medical records in [facility]  Chief Complaint: ***  HPI: Claire Guzman is a 57 y.o. female with medical history significant for *** who is admitted to [facility] on 11/12/2022 with *** after presenting from home*** to [facility] ED complaining of ***.    ***       ***   ED Course:  Vital signs in the ED were notable for the following: ***  Labs were notable for the following: ***  Per my interpretation, EKG in ED demonstrated the following:  ***  Imaging and additional notable ED work-up: ***  While in the ED, the following were administered: ***  Subsequently, the patient was admitted  ***  ***red    Review of Systems: As per HPI otherwise 10 point review of systems negative.   Past Medical History:  Diagnosis Date   Abdominal pain, unspecified site    Anxiety state, unspecified    Complication of anesthesia    convulsions - when she has appendectomy about 12 years ago at [facility]   Contact dermatitis and other eczema, due to unspecified cause    Essential hypertension, benign    Family history of colon cancer    Family history of melanoma    Family history of prostate cancer    GERD (gastroesophageal reflux disease)    Hematuria, unspecified    Hypertension    Lower back pain    Mixed hyperlipidemia    Rosacea    rt breast ca dx'd 01/2018   R breast cancer   Unspecified symptom associated with female genital organs     Past Surgical History:  Procedure Laterality Date   BREAST RECONSTRUCTION WITH PLACEMENT OF TISSUE EXPANDER AND ALLODERM Right 09/14/2018   Procedure: BREAST RECONSTRUCTION WITH PLACEMENT OF TISSUE EXPANDER AND ALLODERM;  Surgeon: Glenna Fellows, MD;  Location: [facility];  Service: Plastics;   Laterality: Right;   CESAREAN SECTION     DIAGNOSTIC LAPAROSCOPY     EYE SURGERY     Lasik   LAPAROSCOPIC APPENDECTOMY     LIPOSUCTION WITH LIPOFILLING Right 07/26/2019   Procedure: LIPOFILLING from abdomen to R chest;  Surgeon: Glenna Fellows, MD;  Location: [facility];  Service: Plastics;  Laterality: Right;   MASTECTOMY WITH RADIOACTIVE SEED GUIDED EXCISION AND AXILLARY SENTINEL LYMPH NODE BIOPSY Right 09/14/2018   Procedure: RIGHT NIPPLE SPARING MASTECTOMY WITH TARGETED DEEP RIGHT AXILLARY LYMPH NODE BIOPSY WITH RADIOACTIVE SEED LOCALIZATION AND RIGHT AXILLARY SENTINEL LYMPH NODE BIOPSY, FROZEN SECTION AND POSSIBLE COMPLETION AXILLARY LYMPH NODE DISSECTION INJECT BLUE DYE RIGHT BREAST;  Surgeon: Claud Kelp, MD;  Location: [facility];  Service: General;  Laterality: Right;   PLANTAR FASCIA SURGERY Left 04/17/2022   PORT-A-CATH REMOVAL Left 07/26/2019   Procedure: REMOVAL PORT-A-CATH;  Surgeon: Glenna Fellows, MD;  Location: [facility];  Service: Plastics;  Laterality: Left;   PORTACATH PLACEMENT Left 04/13/2018   Procedure: INSERTION PORT-A-CATH WITH [place];  Surgeon: Claud Kelp, MD;  Location: [facility];  Service: General;  Laterality: Left;   REMOVAL OF TISSUE EXPANDER AND PLACEMENT OF IMPLANT Right 07/26/2019   Procedure: REMOVAL OF TISSUE EXPANDER AND PLACEMENT OF IMPLANT;  Surgeon: Glenna Fellows, MD;  Location: [facility];  Service: Plastics;  Laterality: Right;  UTERINE FIBROID SURGERY      Social History:  reports that she has never smoked. She has never been exposed to tobacco smoke. She has never used smokeless tobacco. She reports that she does not drink alcohol and does not use drugs.   Allergies  Allergen Reactions   Trazodone And Nefazodone Other (See Comments)    Facial swelling and feet swelling, nasal congestion   Metrogel [Metronidazole]     Burned her face   Other Other (See Comments)     Dental Cement--tongue and cheek burning   Paxil [Paroxetine Hcl]     Lethargic/felt bad    Sulfamethoxazole Swelling   Zomig [Zolmitriptan]     Elevated blood pressure    Prednisone     Turns skin red, heart racing- side effect    Family History  Problem Relation Age of Onset   Heart failure Mother    Melanoma Mother 66       has had several removed over the past ten years   Atrial fibrillation Mother    Prostate cancer Father 38   Stroke Father    Throat cancer Father    Heart Problems Maternal Aunt        d.60   Heart Problems Maternal Uncle        d.60   Colon cancer Paternal Aunt        dx 62s   Colon cancer Paternal Uncle        dx 6s   Heart attack Maternal Grandmother    Heart attack Maternal Grandfather        possible cancer, unknown type   Stroke Paternal Grandmother    Colon cancer Paternal Grandfather        dx upper 16s   Healthy Daughter     Family history reviewed and not pertinent ***   Prior to Admission medications   Medication Sig Start Date End Date Taking? Authorizing Provider  potassium chloride SA (KLOR-CON M) 20 MEQ tablet TAKE 1 TABLET(20 MEQ) BY MOUTH DAILY 10/27/22   Pollyann Samples, NP  ALPRAZolam Prudy Feeler) 0.5 MG tablet TAKE 1 TABLET(0.5 MG) BY MOUTH TWICE DAILY AS NEEDED FOR ANXIETY 09/27/21   Malachy Mood, MD  Ascorbic Acid (VITAMIN C) 1000 MG tablet Take 2,000 mg by mouth daily. 08/28/22   [provider]  atenolol (TENORMIN) 50 MG tablet Take 50 mg by mouth at bedtime.    [provider]  Cholecalciferol (VITAMIN D3) 50 MCG (2000 UT) capsule Take 2,000 Units by mouth daily. 05/13/20   [provider]  hydroxychloroquine (PLAQUENIL) 200 MG tablet Take 1 tablet 200 mg BID Monday-Friday 09/03/22   Gearldine Bienenstock, PA-C  ibuprofen (ADVIL) 800 MG tablet Take 1 tablet (800 mg total) by mouth every 8 (eight) hours as needed. 08/02/21   Malachy Mood, MD  lactulose (CHRONULAC) 10 GM/15ML solution TAKE 20 MLS BY MOUTH EVERY 4 HOURS AS  NEEDED FOR CONSTIPATION Patient not taking: Reported on 10/22/2022 03/30/21   Malachy Mood, MD  methocarbamol (ROBAXIN) 750 MG tablet Take 1 tablet (750 mg total) by mouth 3 (three) times daily as needed (muscle spasm/pain). Patient not taking: Reported on 10/22/2022 02/06/22   Cathren Laine, MD  mupirocin ointment (BACTROBAN) 2 % Apply 1 Application topically 2 (two) times daily. 10/22/22   Gearldine Bienenstock, PA-C  pantoprazole (PROTONIX) 40 MG tablet Take by mouth. 09/03/22   [provider]  traMADol (ULTRAM) 50 MG tablet TAKE 1 TABLET BY MOUTH EVERY 12 HOURS  AS NEEDED Patient not taking: Reported on 08/19/2022 12/17/21   Vivi Barrack, DPM  valACYclovir (VALTREX) 500 MG tablet valacyclovir 500 mg tablet  TAKE 1 TABLET BY MOUTH TWICE DAILY    [provider]  venlafaxine XR (EFFEXOR-XR) 150 MG 24 hr capsule TAKE 1 CAPSULE(150 MG) BY MOUTH DAILY WITH BREAKFAST 02/24/22   Malachy Mood, MD  omeprazole (PRILOSEC) 20 MG capsule Take 1 capsule (20 mg total) by mouth 2 (two) times daily before a meal. Patient not taking: Reported on 01/03/2020 05/03/18 10/30/20  Malachy Mood, MD  prochlorperazine (COMPAZINE) 10 MG tablet TAKE 1 TABLET BY MOUTH EVERY 6 HOURS AS NEEDED FOR NAUSEA OR VOMITING 08/10/18 08/12/18  Malachy Mood, MD     Objective    Physical Exam: Vitals:   11/12/22 1530 11/12/22 1600 11/12/22 1950 11/12/22 2118  BP: (!) 150/79 (!) 159/76 (!) 145/90 (!) 144/89  Pulse: 71  68 65  Resp: 14 15 16 16   Temp:   98.7 F (37.1 C) 98 F (36.7 C)  TempSrc:   Oral Oral  SpO2: 99%  100%   Weight:    73.2 kg    General: appears to be stated age; alert, oriented Skin: warm, dry, no rash Head:  AT/[place] Mouth:  Oral mucosa membranes appear moist, normal dentition Neck: supple; trachea midline Heart:  RRR; did not appreciate any M/R/G Lungs: CTAB, did not appreciate any wheezes, rales, or rhonchi Abdomen: + BS; soft, ND, NT Vascular: 2+ pedal pulses b/l; 2+ radial pulses b/l Extremities: no  peripheral edema, no muscle wasting Neuro: strength and sensation intact in upper and lower extremities b/l ***   *** Neuro: 5/5 strength of the proximal and distal flexors and extensors of the upper and lower extremities bilaterally; sensation intact in upper and lower extremities b/l; cranial nerves II through XII grossly intact; no pronator drift; no evidence suggestive of slurred speech, dysarthria, or facial droop; Normal muscle tone. No tremors.  *** Neuro: In the setting of the patient's current mental status and associated inability to follow instructions, unable to perform full neurologic exam at this time.  As such, assessment of strength, sensation, and cranial nerves is limited at this time. Patient noted to spontaneously move all 4 extremities. No tremors.  ***    Labs on Admission: I have personally reviewed following labs and imaging studies  CBC: Recent Labs  Lab 11/12/22 0915  WBC 5.5  HGB 12.9  HCT 34.6*  MCV 86.7  PLT 170   Basic Metabolic Panel: Recent Labs  Lab 11/12/22 0915  NA 139  K 3.5  CL 103  CO2 25  GLUCOSE 102*  BUN 9  CREATININE 0.58  CALCIUM 9.5   GFR: Estimated Creatinine Clearance: 77.9 mL/min (by C-G formula based on SCr of 0.58 mg/dL). Liver Function Tests: No results for input(s): "AST", "ALT", "ALKPHOS", "BILITOT", "PROT", "ALBUMIN" in the last 168 hours. No results for input(s): "LIPASE", "AMYLASE" in the last 168 hours. No results for input(s): "AMMONIA" in the last 168 hours. Coagulation Profile: No results for input(s): "INR", "PROTIME" in the last 168 hours. Cardiac Enzymes: No results for input(s): "CKTOTAL", "CKMB", "CKMBINDEX", "TROPONINI" in the last 168 hours. BNP (last 3 results) No results for input(s): "PROBNP" in the last 8760 hours. HbA1C: No results for input(s): "HGBA1C" in the last 72 hours. CBG: No results for input(s): "GLUCAP" in the last 168 hours. Lipid Profile: No results for input(s): "CHOL", "HDL",  "LDLCALC", "TRIG", "CHOLHDL", "LDLDIRECT" in the last 72 hours.  Thyroid Function Tests: No results for input(s): "TSH", "T4TOTAL", "FREET4", "T3FREE", "THYROIDAB" in the last 72 hours. Anemia Panel: No results for input(s): "VITAMINB12", "FOLATE", "FERRITIN", "TIBC", "IRON", "RETICCTPCT" in the last 72 hours. Urine analysis:    Component Value Date/Time   COLORURINE STRAW (A) 02/18/2019 1744   APPEARANCEUR CLEAR 02/18/2019 1744   LABSPEC 1.005 02/18/2019 1744   PHURINE 6.0 02/18/2019 1744   GLUCOSEU NEGATIVE 02/18/2019 1744   HGBUR NEGATIVE 02/18/2019 1744   BILIRUBINUR negative 10/30/2020 1128   KETONESUR negative 10/30/2020 1128   KETONESUR NEGATIVE 02/18/2019 1744   PROTEINUR negative 10/30/2020 1128   PROTEINUR NEGATIVE 02/18/2019 1744   UROBILINOGEN 0.2 10/30/2020 1128   UROBILINOGEN 0.2 03/16/2013 1836   NITRITE Negative 10/30/2020 1128   NITRITE NEGATIVE 02/18/2019 1744   LEUKOCYTESUR Negative 10/30/2020 1128   LEUKOCYTESUR NEGATIVE 02/18/2019 1744    Radiological Exams on Admission: US Abdomen Limited RUQ (LIVER/GB)  Result Date: 11/12/2022 CLINICAL DATA:  Right upper quadrant abdominal pain EXAM: ULTRASOUND ABDOMEN LIMITED RIGHT UPPER QUADRANT COMPARISON:  None Available. FINDINGS: Gallbladder: There is a 2.2 cm gallstone at the gallbladder neck. Gallbladder wall measures up to 4 mm. No sonographic Murphy sign noted by sonographer. Common bile duct: Diameter: 4 mm, which is normal Liver: No focal lesion identified. Within normal limits in parenchymal echogenicity. Portal vein is patent on color Doppler imaging with normal direction of blood flow towards the liver. Other: None. IMPRESSION: Cholelithiasis with equivocal findings for cholecystitis (the sonographic Murphy sign was negative). A HIDA scan could be performed for more definitive characterization. Electronically Signed   By: Lorenza Cambridge M.D.   On: 11/12/2022 12:57   DG Chest 2 View  Result Date:  11/12/2022 CLINICAL DATA:  Chest pain EXAM: CHEST - 2 VIEW COMPARISON:  CXR 10/30/20. FINDINGS: No pleural effusion. No pneumothorax. No focal airspace opacity. Normal cardiac and mediastinal contours. No radiographically apparent displaced rib fractures. Visualized upper abdomen is unremarkable. Surgical clips in the right axilla. Vertebral body heights are maintained. IMPRESSION: No focal airspace opacity Electronically Signed   By: Lorenza Cambridge M.D.   On: 11/12/2022 09:42      Assessment/Plan   Principal Problem:   Chest pain   ***       ***            ***             ***            ***            ***            ***             ***           ***           ***           ***           ***          ***          ***  DVT prophylaxis: SCD's ***  Code Status: Full code*** Family Communication: none*** Disposition Plan: Per Rounding Team Consults called: none***;  Admission status: ***     I SPENT GREATER THAN 75 *** MINUTES IN CLINICAL CARE TIME/MEDICAL DECISION-MAKING IN COMPLETING THIS ADMISSION.      Chaney Born Braedon Sjogren DO [facility]  From 7PM - 7AM   11/12/2022, 9:41 PM   ***

## 2022-11-12 NOTE — ED Notes (Signed)
[facility] at [facility] will send transport, lil delay with shift change. Bed Ready at [facility] [address] Rm# [id number])

## 2022-11-12 NOTE — ED Triage Notes (Signed)
Chest pain started this am , center of chest pressure, radiating down the left arm. Now some shoulder pain and above shoulder blades . Pt does endorse gallstones

## 2022-11-12 NOTE — ED Provider Notes (Signed)
I provided a substantive portion of the care of this patient.  I personally made/approved the management plan for this patient and take responsibility for the patient management.  EKG Interpretation  Date/Time:  Wednesday November 12 2022 09:06:22 EDT Ventricular Rate:  79 PR Interval:  146 QRS Duration: 109 QT Interval:  410 QTC Calculation: 470 R Axis:   70 Text Interpretation: Sinus rhythm Probable left atrial enlargement Minimal ST depression, diffuse leads need repeat for artifact in inferior leads Confirmed by Arby Barrette [phone]) on 11/12/2022 9:33:13 AM   Patient reports has been having some arm pain for a number of weeks.  She associates it mostly with resting or upon awakening from sleep.  She assumed it was probably musculoskeletal.  Patient reports however she did develop some pressure in the center of her chest today.  No cardiac history.  Patient is alert with clear mental status no respiratory distress.  Heart regular no rub or gallop.  Lungs clear to auscultation.  Radial pulses are 2+ and strong.  Patient does not have any objective swelling or tenderness of the left upper extremity.  Initial blood pressure reading was elevated at 172/97 however this will be repeated and patient is normotensive.  Agree with obtaining ACS rule out and D-dimer.  I agree with plan of management.   Arby Barrette, MD 11/12/22 1104

## 2022-11-13 ENCOUNTER — Encounter (HOSPITAL_COMMUNITY): Payer: Self-pay | Admitting: Internal Medicine

## 2022-11-13 ENCOUNTER — Other Ambulatory Visit (HOSPITAL_BASED_OUTPATIENT_CLINIC_OR_DEPARTMENT_OTHER): Payer: Self-pay

## 2022-11-13 ENCOUNTER — Other Ambulatory Visit: Payer: Self-pay | Admitting: Physician Assistant

## 2022-11-13 ENCOUNTER — Encounter (HOSPITAL_COMMUNITY)
Admission: EM | Disposition: A | Discharge status: Home / Self Care | Payer: Self-pay | Source: Home / Self Care | Attending: Emergency Medicine

## 2022-11-13 ENCOUNTER — Observation Stay (HOSPITAL_COMMUNITY): Payer: Medicaid Other

## 2022-11-13 DIAGNOSIS — K219 Gastro-esophageal reflux disease without esophagitis: Secondary | ICD-10-CM | POA: Diagnosis present

## 2022-11-13 DIAGNOSIS — F411 Generalized anxiety disorder: Secondary | ICD-10-CM | POA: Diagnosis present

## 2022-11-13 DIAGNOSIS — I200 Unstable angina: Secondary | ICD-10-CM

## 2022-11-13 DIAGNOSIS — R079 Chest pain, unspecified: Secondary | ICD-10-CM

## 2022-11-13 DIAGNOSIS — R072 Precordial pain: Secondary | ICD-10-CM

## 2022-11-13 DIAGNOSIS — R7989 Other specified abnormal findings of blood chemistry: Secondary | ICD-10-CM | POA: Diagnosis present

## 2022-11-13 DIAGNOSIS — I10 Essential (primary) hypertension: Secondary | ICD-10-CM | POA: Diagnosis not present

## 2022-11-13 HISTORY — PX: LEFT HEART CATH AND CORONARY ANGIOGRAPHY: CATH118249

## 2022-11-13 LAB — CBC WITH DIFFERENTIAL/PLATELET
Abs Immature Granulocytes: 0.01 10*3/uL (ref 0.00–0.07)
Basophils Absolute: 0 10*3/uL (ref 0.0–0.1)
Basophils Relative: 0 %
Eosinophils Absolute: 0.2 10*3/uL (ref 0.0–0.5)
Eosinophils Relative: 3 %
HCT: 35.6 % — ABNORMAL LOW (ref 36.0–46.0)
Hemoglobin: 12.6 g/dL (ref 12.0–15.0)
Immature Granulocytes: 0 %
Lymphocytes Relative: 43 %
Lymphs Abs: 2.8 10*3/uL (ref 0.7–4.0)
MCH: 31.6 pg (ref 26.0–34.0)
MCHC: 35.4 g/dL (ref 30.0–36.0)
MCV: 89.2 fL (ref 80.0–100.0)
Monocytes Absolute: 0.4 10*3/uL (ref 0.1–1.0)
Monocytes Relative: 6 %
Neutro Abs: 3.2 10*3/uL (ref 1.7–7.7)
Neutrophils Relative %: 48 %
Platelets: 192 10*3/uL (ref 150–400)
RBC: 3.99 MIL/uL (ref 3.87–5.11)
RDW: 12.2 % (ref 11.5–15.5)
WBC: 6.6 10*3/uL (ref 4.0–10.5)
nRBC: 0 % (ref 0.0–0.2)

## 2022-11-13 LAB — COMPREHENSIVE METABOLIC PANEL
ALT: 16 U/L (ref 0–44)
AST: 20 U/L (ref 15–41)
Albumin: 3.7 g/dL (ref 3.5–5.0)
Alkaline Phosphatase: 71 U/L (ref 38–126)
Anion gap: 8 (ref 5–15)
BUN: 12 mg/dL (ref 6–20)
CO2: 27 mmol/L (ref 22–32)
Calcium: 8.9 mg/dL (ref 8.9–10.3)
Chloride: 105 mmol/L (ref 98–111)
Creatinine, Ser: 0.64 mg/dL (ref 0.44–1.00)
GFR, Estimated: >60 mL/min (ref >60)
Glucose, Bld: 104 mg/dL — ABNORMAL HIGH (ref 70–99)
Potassium: 3.3 mmol/L — ABNORMAL LOW (ref 3.5–5.1)
Sodium: 140 mmol/L (ref 135–145)
Total Bilirubin: 0.6 mg/dL (ref 0.3–1.2)
Total Protein: 6.6 g/dL (ref 6.5–8.1)

## 2022-11-13 LAB — MAGNESIUM: Magnesium: 2 mg/dL (ref 1.7–2.4)

## 2022-11-13 LAB — LIPASE, BLOOD: Lipase: 79 U/L — ABNORMAL HIGH (ref 11–51)

## 2022-11-13 LAB — TROPONIN I (HIGH SENSITIVITY): Troponin I (High Sensitivity): 44 ng/L — ABNORMAL HIGH (ref <18)

## 2022-11-13 SURGERY — LEFT HEART CATH AND CORONARY ANGIOGRAPHY
Anesthesia: LOCAL

## 2022-11-13 MED ORDER — SODIUM CHLORIDE 0.9% FLUSH: 3.0000 mL | Freq: Two times a day (BID) | INTRAVENOUS | Status: DC

## 2022-11-13 MED ORDER — VERAPAMIL HCL 2.5 MG/ML IV SOLN
INTRAVENOUS | Status: DC | PRN
  Administered 2022-11-13: 10 mL via INTRA_ARTERIAL

## 2022-11-13 MED ORDER — ATENOLOL 50 MG PO TABS
50.0000 mg | ORAL_TABLET | Freq: Every day | ORAL | Status: DC
  Filled 2022-11-13: qty 1

## 2022-11-13 MED ORDER — ALPRAZOLAM 0.5 MG PO TABS: 0.5000 mg | ORAL_TABLET | Freq: Two times a day (BID) | ORAL | Status: DC | PRN

## 2022-11-13 MED ORDER — FENTANYL CITRATE (PF) 100 MCG/2ML IJ SOLN
INTRAMUSCULAR | Status: AC
  Filled 2022-11-13: qty 2

## 2022-11-13 MED ORDER — VERAPAMIL HCL 2.5 MG/ML IV SOLN
INTRAVENOUS | Status: AC
  Filled 2022-11-13: qty 2

## 2022-11-13 MED ORDER — ASPIRIN 81 MG PO CHEW
81.0000 mg | CHEWABLE_TABLET | ORAL | Status: AC
  Administered 2022-11-13: 81 mg via ORAL
  Filled 2022-11-13: qty 1

## 2022-11-13 MED ORDER — SODIUM CHLORIDE 0.9 % IV SOLN: INTRAVENOUS | Status: AC

## 2022-11-13 MED ORDER — HEPARIN (PORCINE) IN NACL 1000-0.9 UT/500ML-% IV SOLN
INTRAVENOUS | Status: DC | PRN
  Administered 2022-11-13 (×2): 500 mL

## 2022-11-13 MED ORDER — VENLAFAXINE HCL ER 75 MG PO CP24
150.0000 mg | ORAL_CAPSULE | Freq: Every day | ORAL | Status: DC
  Administered 2022-11-13: 150 mg via ORAL
  Filled 2022-11-13: qty 2

## 2022-11-13 MED ORDER — LIDOCAINE HCL (PF) 1 % IJ SOLN
INTRAMUSCULAR | Status: DC | PRN
  Administered 2022-11-13: 5 mL

## 2022-11-13 MED ORDER — SODIUM CHLORIDE 0.9 % WEIGHT BASED INFUSION
3.0000 mL/kg/h | INTRAVENOUS | Status: AC
  Administered 2022-11-13: 3 mL/kg/h via INTRAVENOUS

## 2022-11-13 MED ORDER — SODIUM CHLORIDE 0.9% FLUSH: 3.0000 mL | INTRAVENOUS | Status: DC | PRN

## 2022-11-13 MED ORDER — SODIUM CHLORIDE 0.9% FLUSH
3.0000 mL | Freq: Two times a day (BID) | INTRAVENOUS | Status: DC
  Administered 2022-11-13: 3 mL via INTRAVENOUS

## 2022-11-13 MED ORDER — LIDOCAINE HCL (PF) 1 % IJ SOLN
INTRAMUSCULAR | Status: AC
  Filled 2022-11-13: qty 30

## 2022-11-13 MED ORDER — ASPIRIN 81 MG PO TBEC
81.0000 mg | DELAYED_RELEASE_TABLET | Freq: Every day | ORAL | Status: DC
  Filled 2022-11-13: qty 1

## 2022-11-13 MED ORDER — ALPRAZOLAM 0.5 MG PO TABS: 0.5000 mg | ORAL_TABLET | Freq: Once | ORAL | Status: DC

## 2022-11-13 MED ORDER — IOHEXOL 350 MG/ML SOLN
INTRAVENOUS | Status: DC | PRN
  Administered 2022-11-13: 55 mL

## 2022-11-13 MED ORDER — PANTOPRAZOLE SODIUM 40 MG PO TBEC
40.0000 mg | DELAYED_RELEASE_TABLET | Freq: Every day | ORAL | Status: DC
  Administered 2022-11-13: 40 mg via ORAL
  Filled 2022-11-13: qty 1

## 2022-11-13 MED ORDER — ASPIRIN 81 MG PO TBEC
81.0000 mg | DELAYED_RELEASE_TABLET | Freq: Every day | ORAL | 12 refills | Status: AC
  Filled 2022-11-13: qty 120, 120d supply, fill #0

## 2022-11-13 MED ORDER — SODIUM CHLORIDE 0.9 % IV SOLN: 250.0000 mL | INTRAVENOUS | Status: DC | PRN

## 2022-11-13 MED ORDER — ATORVASTATIN CALCIUM 80 MG PO TABS
80.0000 mg | ORAL_TABLET | Freq: Every day | ORAL | Status: DC
  Administered 2022-11-13: 80 mg via ORAL
  Filled 2022-11-13: qty 1

## 2022-11-13 MED ORDER — MIDAZOLAM HCL 2 MG/2ML IJ SOLN
INTRAMUSCULAR | Status: AC
  Filled 2022-11-13: qty 2

## 2022-11-13 MED ORDER — SODIUM CHLORIDE 0.9 % WEIGHT BASED INFUSION
1.0000 mL/kg/h | INTRAVENOUS | Status: DC
  Administered 2022-11-13: 1 mL/kg/h via INTRAVENOUS

## 2022-11-13 MED ORDER — LABETALOL HCL 5 MG/ML IV SOLN: 10.0000 mg | INTRAVENOUS | Status: DC | PRN

## 2022-11-13 MED ORDER — POTASSIUM CHLORIDE CRYS ER 20 MEQ PO TBCR
20.0000 meq | EXTENDED_RELEASE_TABLET | Freq: Every day | ORAL | Status: DC
  Administered 2022-11-13: 20 meq via ORAL
  Filled 2022-11-13: qty 1

## 2022-11-13 MED ORDER — FENTANYL CITRATE (PF) 100 MCG/2ML IJ SOLN
INTRAMUSCULAR | Status: DC | PRN
  Administered 2022-11-13: 25 ug via INTRAVENOUS

## 2022-11-13 MED ORDER — HEPARIN SODIUM (PORCINE) 1000 UNIT/ML IJ SOLN
INTRAMUSCULAR | Status: AC
  Filled 2022-11-13: qty 10

## 2022-11-13 MED ORDER — HEPARIN SODIUM (PORCINE) 1000 UNIT/ML IJ SOLN
INTRAMUSCULAR | Status: DC | PRN
  Administered 2022-11-13: 4000 [IU] via INTRAVENOUS

## 2022-11-13 MED ORDER — HYDRALAZINE HCL 20 MG/ML IJ SOLN: 10.0000 mg | INTRAMUSCULAR | Status: DC | PRN

## 2022-11-13 MED ORDER — ATORVASTATIN CALCIUM 40 MG PO TABS
40.0000 mg | ORAL_TABLET | Freq: Every day | ORAL | 2 refills | Status: DC
  Filled 2022-11-13: qty 30, 30d supply, fill #0

## 2022-11-13 MED ORDER — MIDAZOLAM HCL 2 MG/2ML IJ SOLN
INTRAMUSCULAR | Status: DC | PRN
  Administered 2022-11-13: 2 mg via INTRAVENOUS

## 2022-11-13 SURGICAL SUPPLY — 9 items
CATH 5FR JL3.5 JR4 ANG PIG MP (CATHETERS) IMPLANT
DEVICE RAD TR BAND REGULAR (VASCULAR PRODUCTS) IMPLANT
GLIDESHEATH SLEND SS 6F .021 (SHEATH) IMPLANT
GUIDEWIRE INQWIRE 1.5J.035X260 (WIRE) IMPLANT
INQWIRE 1.5J .035X260CM (WIRE) ×1
KIT HEART LEFT (KITS) ×1 IMPLANT
PACK CARDIAC CATHETERIZATION (CUSTOM PROCEDURE TRAY) ×1 IMPLANT
TRANSDUCER W/STOPCOCK (MISCELLANEOUS) ×1 IMPLANT
TUBING CIL FLEX 10 FLL-RA (TUBING) ×1 IMPLANT

## 2022-11-13 NOTE — Progress Notes (Signed)
PROGRESS NOTE    Claire Guzman  GGE:[id number] DOB: [birth year] DOA: 11/12/2022 PCP: Medicine, [facility]    Brief Narrative:  Claire Guzman is a 57 y.o. female with medical history significant for essential hypertension, GERD, GAD, presented to [facility] emergency department with chest pain radiating to the left shoulder.  No history of coronary artery disease.  Patient did have previous echocardiogram in 2020 with preserved LV function.  Patient does have history of GERD on Protonix and generalized anxiety disorder on Effexor and as needed Xanax.  In the ED, patient had mildly elevated troponins and cardiology was consulted who recommended observation in the hospital.   Assessment and plan.  Chest Pain likely unstable angina:  Heart score of 4.  Cardiology has seen the patient.  EKG with subtle ST depression in lateral leads.  Patient with high coronary risk factors.  Recommend coronary CT scan.  Mildly elevated flat troponins.   Lipase 79.  UDS pending.  Troponins at 26- 38-44. Continue beta-blocker.Continue Protonix and Xanax from home.   Check 2D echocardiogram..  Plan for cardiac catheterization as per cardiology.   Essential Hypertension: Continue home beta-blocker.     GERD: Continue PPI.   Generalized anxiety disorder:  Continue Effexor as well as prn Xanax.    DVT prophylaxis: SCDs Start: 11/12/22 2138   Code Status:     Code Status: Full Code  Disposition: Home Status is: Observation The patient will require care spanning > 2 midnights and should be moved to inpatient because: Chest pain, unstable angina, need for cardiac catheterization,   Family Communication: None at bedside  Consultants:  Cardiology  Procedures:  None yet  Antimicrobials:  None Anti-infectives (From admission, onward)    None        Subjective: Today, patient was seen and examined at bedside.  Patient had some radiation of the pain on the left  which has lessened at this time.  Denies shortness of breath  Objective: Vitals:   11/13/22 0034 11/13/22 0527 11/13/22 0822 11/13/22 0943  BP: (!) 140/70 124/68 124/61   Pulse: 65 65 62   Resp: 16 16 16    Temp: 98 F (36.7 C) 98 F (36.7 C) 97.6 F (36.4 C)   TempSrc: Oral Oral Axillary   SpO2: 100% 100% 97%   Weight:  73.1 kg  74.8 kg    Intake/Output Summary (Last 24 hours) at 11/13/2022 1130 Last data filed at 11/13/2022 0830 Gross per 24 hour  Intake 760 ml  Output --  Net 760 ml   Filed Weights   11/12/22 2118 11/13/22 0527 11/13/22 0943  Weight: 73.2 kg 73.1 kg 74.8 kg    Physical Examination: Body mass index is 28.31 kg/m.  General:  Average built, not in obvious distress HENT:   No scleral pallor or icterus noted. Oral mucosa is moist.  Chest:  Clear breath sounds.  Diminished breath sounds bilaterally. No crackles or wheezes.  CVS: S1 &S2 heard. No murmur.  Regular rate and rhythm. Abdomen: Soft, nontender, nondistended.  Bowel sounds are heard.   Extremities: No cyanosis, clubbing or edema.  Peripheral pulses are palpable. Psych: Alert, awake and oriented, normal mood CNS:  No cranial nerve deficits.  Power equal in all extremities.   Skin: Warm and dry.  No rashes noted.  Data Reviewed:   CBC: Recent Labs  Lab 11/12/22 0915 11/13/22 0044  WBC 5.5 6.6  NEUTROABS  --  3.2  HGB 12.9 12.6  HCT 34.6* 35.6*  MCV 86.7 89.2  PLT 170 192    Basic Metabolic Panel: Recent Labs  Lab 11/12/22 0915 11/13/22 0632  NA 139 140  K 3.5 3.3*  CL 103 105  CO2 25 27  GLUCOSE 102* 104*  BUN 9 12  CREATININE 0.58 0.64  CALCIUM 9.5 8.9  MG  --  2.0    Liver Function Tests: Recent Labs  Lab 11/13/22 0632  AST 20  ALT 16  ALKPHOS 71  BILITOT 0.6  PROT 6.6  ALBUMIN 3.7     Radiology Studies: US Abdomen Limited RUQ (LIVER/GB)  Result Date: 11/12/2022 CLINICAL DATA:  Right upper quadrant abdominal pain EXAM: ULTRASOUND ABDOMEN LIMITED RIGHT UPPER  QUADRANT COMPARISON:  None Available. FINDINGS: Gallbladder: There is a 2.2 cm gallstone at the gallbladder neck. Gallbladder wall measures up to 4 mm. No sonographic Murphy sign noted by sonographer. Common bile duct: Diameter: 4 mm, which is normal Liver: No focal lesion identified. Within normal limits in parenchymal echogenicity. Portal vein is patent on color Doppler imaging with normal direction of blood flow towards the liver. Other: None. IMPRESSION: Cholelithiasis with equivocal findings for cholecystitis (the sonographic Murphy sign was negative). A HIDA scan could be performed for more definitive characterization. Electronically Signed   By: Lorenza Cambridge M.D.   On: 11/12/2022 12:57   DG Chest 2 View  Result Date: 11/12/2022 CLINICAL DATA:  Chest pain EXAM: CHEST - 2 VIEW COMPARISON:  CXR 10/30/20. FINDINGS: No pleural effusion. No pneumothorax. No focal airspace opacity. Normal cardiac and mediastinal contours. No radiographically apparent displaced rib fractures. Visualized upper abdomen is unremarkable. Surgical clips in the right axilla. Vertebral body heights are maintained. IMPRESSION: No focal airspace opacity Electronically Signed   By: Lorenza Cambridge M.D.   On: 11/12/2022 09:42      LOS: 0 days     Joycelyn Das, MD [facility] Available via Epic secure chat 7am-7pm After these hours, please refer to coverage provider listed on [email or web address] 11/13/2022, 11:30 AM

## 2022-11-13 NOTE — Consult Note (Signed)
Cardiology Consultation:   Patient ID: Claire Guzman MRN: [id number]; DOB: [birth year]  Admit date: 11/12/2022 Date of Consult: 11/13/2022  Primary Care Provider: Medicine, [facility] Primary Cardiologist: None  Primary Electrophysiologist:  None    Patient Profile:   Claire Guzman is a 57 y.o. female with a hx of HTN, right breast cancer s/p mastectomy, connective tissue dz who is being seen today for the evaluation of chest pain at the request of emergency dept.  History of Present Illness:   Claire Guzman is a 57 y.o. female with a hx of HTN, right breast cancer s/p mastectomy, connective tissue dz who began having chest pain yesterday morning. The pain was substernal and central, pressure like that was radiating to left arm and shoulder. Pain was persistent so she presented to the ED for evaluation. By the time she arrived the pain had subsided. She has been having some unusual episodes the last few days of left arm pain, dizziness, and some left jaw pain - all on separate occasions. This was first chest pain episode  She has strong family history of CAD, and risk factors include lupus and prior chemo/radiation. Nonsmoker and does not drink alcohol.  There she was hemodynamically stable. Labs were notable for hsTnI 9->26->28. No ECG changes. CXR unremarkable, she had gallbladder u/s consistent with cholelithiasis and equivocal for cholecystitis.   Cardiology consults for mildly elevated troponin  Past Medical History:  Diagnosis Date   Abdominal pain, unspecified site    Anxiety state, unspecified    Complication of anesthesia    convulsions - when she has appendectomy about 12 years ago at [facility]   Contact dermatitis and other eczema, due to unspecified cause    Essential hypertension, benign    Family history of colon cancer    Family history of melanoma    Family history of prostate cancer    GERD (gastroesophageal reflux disease)     Hematuria, unspecified    Hypertension    Lower back pain    Mixed hyperlipidemia    Rosacea    rt breast ca dx'd 01/2018   R breast cancer   Unspecified symptom associated with female genital organs     Past Surgical History:  Procedure Laterality Date   BREAST RECONSTRUCTION WITH PLACEMENT OF TISSUE EXPANDER AND ALLODERM Right 09/14/2018   Procedure: BREAST RECONSTRUCTION WITH PLACEMENT OF TISSUE EXPANDER AND ALLODERM;  Surgeon: Glenna Fellows, MD;  Location: [facility] OR;  Service: Plastics;  Laterality: Right;   CESAREAN SECTION     DIAGNOSTIC LAPAROSCOPY     EYE SURGERY     Lasik   LAPAROSCOPIC APPENDECTOMY     LIPOSUCTION WITH LIPOFILLING Right 07/26/2019   Procedure: LIPOFILLING from abdomen to R chest;  Surgeon: Glenna Fellows, MD;  Location: [facility];  Service: Plastics;  Laterality: Right;   MASTECTOMY WITH RADIOACTIVE SEED GUIDED EXCISION AND AXILLARY SENTINEL LYMPH NODE BIOPSY Right 09/14/2018   Procedure: RIGHT NIPPLE SPARING MASTECTOMY WITH TARGETED DEEP RIGHT AXILLARY LYMPH NODE BIOPSY WITH RADIOACTIVE SEED LOCALIZATION AND RIGHT AXILLARY SENTINEL LYMPH NODE BIOPSY, FROZEN SECTION AND POSSIBLE COMPLETION AXILLARY LYMPH NODE DISSECTION INJECT BLUE DYE RIGHT BREAST;  Surgeon: Claud Kelp, MD;  Location: [facility] OR;  Service: General;  Laterality: Right;   PLANTAR FASCIA SURGERY Left 04/17/2022   PORT-A-CATH REMOVAL Left 07/26/2019   Procedure: REMOVAL PORT-A-CATH;  Surgeon: Glenna Fellows, MD;  Location: [facility];  Service: Plastics;  Laterality: Left;   PORTACATH PLACEMENT Left  04/13/2018   Procedure: INSERTION PORT-A-CATH WITH [place];  Surgeon: Claud Kelp, MD;  Location: [facility];  Service: General;  Laterality: Left;   REMOVAL OF TISSUE EXPANDER AND PLACEMENT OF IMPLANT Right 07/26/2019   Procedure: REMOVAL OF TISSUE EXPANDER AND PLACEMENT OF IMPLANT;  Surgeon: Glenna Fellows, MD;  Location: [facility];   Service: Plastics;  Laterality: Right;   UTERINE FIBROID SURGERY       Home Medications:  Prior to Admission medications   Medication Sig Start Date End Date Taking? Authorizing Provider  potassium chloride SA (KLOR-CON M) 20 MEQ tablet TAKE 1 TABLET(20 MEQ) BY MOUTH DAILY 10/27/22   Pollyann Samples, NP  ALPRAZolam Prudy Feeler) 0.5 MG tablet TAKE 1 TABLET(0.5 MG) BY MOUTH TWICE DAILY AS NEEDED FOR ANXIETY 09/27/21   Malachy Mood, MD  Ascorbic Acid (VITAMIN C) 1000 MG tablet Take 2,000 mg by mouth daily. 08/28/22   [provider]  atenolol (TENORMIN) 50 MG tablet Take 50 mg by mouth at bedtime.    [provider]  Cholecalciferol (VITAMIN D3) 50 MCG (2000 UT) capsule Take 2,000 Units by mouth daily. 05/13/20   [provider]  hydroxychloroquine (PLAQUENIL) 200 MG tablet Take 1 tablet 200 mg BID Monday-Friday 09/03/22   Gearldine Bienenstock, PA-C  ibuprofen (ADVIL) 800 MG tablet Take 1 tablet (800 mg total) by mouth every 8 (eight) hours as needed. 08/02/21   Malachy Mood, MD  lactulose (CHRONULAC) 10 GM/15ML solution TAKE 20 MLS BY MOUTH EVERY 4 HOURS AS NEEDED FOR CONSTIPATION Patient not taking: Reported on 10/22/2022 03/30/21   Malachy Mood, MD  methocarbamol (ROBAXIN) 750 MG tablet Take 1 tablet (750 mg total) by mouth 3 (three) times daily as needed (muscle spasm/pain). Patient not taking: Reported on 10/22/2022 02/06/22   Cathren Laine, MD  mupirocin ointment (BACTROBAN) 2 % Apply 1 Application topically 2 (two) times daily. 10/22/22   Gearldine Bienenstock, PA-C  pantoprazole (PROTONIX) 40 MG tablet Take by mouth. 09/03/22   [provider]  traMADol (ULTRAM) 50 MG tablet TAKE 1 TABLET BY MOUTH EVERY 12 HOURS AS NEEDED Patient not taking: Reported on 08/19/2022 12/17/21   Vivi Barrack, DPM  valACYclovir (VALTREX) 500 MG tablet valacyclovir 500 mg tablet  TAKE 1 TABLET BY MOUTH TWICE DAILY    [provider]  venlafaxine XR (EFFEXOR-XR) 150 MG 24 hr capsule TAKE 1 CAPSULE(150 MG)  BY MOUTH DAILY WITH BREAKFAST 02/24/22   Malachy Mood, MD  omeprazole (PRILOSEC) 20 MG capsule Take 1 capsule (20 mg total) by mouth 2 (two) times daily before a meal. Patient not taking: Reported on 01/03/2020 05/03/18 10/30/20  Malachy Mood, MD  prochlorperazine (COMPAZINE) 10 MG tablet TAKE 1 TABLET BY MOUTH EVERY 6 HOURS AS NEEDED FOR NAUSEA OR VOMITING 08/10/18 08/12/18  Malachy Mood, MD    Inpatient Medications: Scheduled Meds:  Continuous Infusions:  PRN Meds: acetaminophen **OR** acetaminophen, ALPRAZolam, melatonin, nitroGLYCERIN, ondansetron (ZOFRAN) IV  Allergies:    Allergies  Allergen Reactions   Trazodone And Nefazodone Other (See Comments)    Facial swelling and feet swelling, nasal congestion   Metrogel [Metronidazole]     Burned her face   Other Other (See Comments)    Dental Cement--tongue and cheek burning   Paxil [Paroxetine Hcl]     Lethargic/felt bad    Sulfamethoxazole Swelling   Zomig [Zolmitriptan]     Elevated blood pressure    Prednisone     Turns skin red, heart racing- side  effect    Social History:   Social History   Socioeconomic History   Marital status: Married    Spouse name: Not on file   Number of children: 1   Years of education: Not on file   Highest education level: Not on file  Occupational History   Not on file  Tobacco Use   Smoking status: Never    Passive exposure: Never   Smokeless tobacco: Never  Vaping Use   Vaping Use: Never used  Substance and Sexual Activity   Alcohol use: No   Drug use: Never   Sexual activity: Not on file  Other Topics Concern   Not on file  Social History Narrative   Not on file   Social Determinants of Health   Financial Resource Strain: Not on file  Food Insecurity: Not on file  Transportation Needs: No Transportation Needs (10/27/2018)   PRAPARE - Administrator, Civil Service (Medical): No    Lack of Transportation (Non-Medical): No  Physical Activity: Not on file  Stress: Not on  file  Social Connections: Not on file  Intimate Partner Violence: Not At Risk (10/27/2018)   Humiliation, Afraid, Rape, and Kick questionnaire    Fear of Current or Ex-Partner: No    Emotionally Abused: No    Physically Abused: No    Sexually Abused: No    Family History:    Family History  Problem Relation Age of Onset   Heart failure Mother    Melanoma Mother 26       has had several removed over the past ten years   Atrial fibrillation Mother    Prostate cancer Father 21   Stroke Father    Throat cancer Father    Heart Problems Maternal Aunt        d.60   Heart Problems Maternal Uncle        d.60   Colon cancer Paternal Aunt        dx 3s   Colon cancer Paternal Uncle        dx 73s   Heart attack Maternal Grandmother    Heart attack Maternal Grandfather        possible cancer, unknown type   Stroke Paternal Grandmother    Colon cancer Paternal Grandfather        dx upper 15s   Healthy Daughter      Review of Systems: 12 point review of systems negative unless otherwise noted in the HPI  Physical Exam/Data:   Vitals:   11/12/22 1600 11/12/22 1950 11/12/22 2118 11/13/22 0034  BP: (!) 159/76 (!) 145/90 (!) 144/89 (!) 140/70  Pulse:  68 65 65  Resp: Temp:  98.7 F (37.1 C) 98 F (36.7 C) 98 F (36.7 C)  TempSrc:  Oral Oral Oral  SpO2:  100%    Weight:   73.2 kg     Intake/Output Summary (Last 24 hours) at 11/13/2022 0107 Last data filed at 11/13/2022 0020 Gross per 24 hour  Intake 520 ml  Output --  Net 520 ml   Filed Weights   11/12/22 2118  Weight: 73.2 kg   Body mass index is 27.69 kg/m.  General:  Well nourished, well developed, in no acute distress HEENT: normal Lymph: no adenopathy Neck: no JVD Vascular: No carotid bruits; FA pulses 2+ bilaterally without bruits  Cardiac:  normal S1, S2; RRR; no murmur  Lungs:  clear to auscultation bilaterally, no wheezing, rhonchi or  rales  Abd: soft, nontender, no hepatomegaly  Ext: no  edema Musculoskeletal:  No deformities, BUE and BLE strength normal and equal Skin: warm and dry  Neuro:  CNs 2-12 intact, no focal abnormalities noted Psych:  Normal affect   EKG:  The EKG was personally reviewed and demonstrates:  normal sinus rhythm with no st or t wave changes  Telemetry:  Telemetry was personally reviewed and demonstrates:  normal sinus rhythm  Relevant CV Studies: none  Laboratory Data:  Chemistry Recent Labs  Lab 11/12/22 0915  NA 139  K 3.5  CL 103  CO2 25  GLUCOSE 102*  BUN 9  CREATININE 0.58  CALCIUM 9.5  GFRNONAA >60  ANIONGAP 11    No results for input(s): "PROT", "ALBUMIN", "AST", "ALT", "ALKPHOS", "BILITOT" in the last 168 hours. Hematology Recent Labs  Lab 11/12/22 0915 11/13/22 0044  WBC 5.5 6.6  RBC 3.99 3.99  HGB 12.9 12.6  HCT 34.6* 35.6*  MCV 86.7 89.2  MCH 32.3 31.6  MCHC 37.3* 35.4  RDW 12.0 12.2  PLT 170 192   Cardiac EnzymesNo results for input(s): "TROPONINI" in the last 168 hours. No results for input(s): "TROPIPOC" in the last 168 hours.  BNPNo results for input(s): "BNP", "PROBNP" in the last 168 hours.  DDimer  Recent Labs  Lab 11/12/22 0943  DDIMER 0.43    Radiology/Studies:  US Abdomen Limited RUQ (LIVER/GB)  Result Date: 11/12/2022 CLINICAL DATA:  Right upper quadrant abdominal pain EXAM: ULTRASOUND ABDOMEN LIMITED RIGHT UPPER QUADRANT COMPARISON:  None Available. FINDINGS: Gallbladder: There is a 2.2 cm gallstone at the gallbladder neck. Gallbladder wall measures up to 4 mm. No sonographic Murphy sign noted by sonographer. Common bile duct: Diameter: 4 mm, which is normal Liver: No focal lesion identified. Within normal limits in parenchymal echogenicity. Portal vein is patent on color Doppler imaging with normal direction of blood flow towards the liver. Other: None. IMPRESSION: Cholelithiasis with equivocal findings for cholecystitis (the sonographic Murphy sign was negative). A HIDA scan could be performed  for more definitive characterization. Electronically Signed   By: Lorenza Cambridge M.D.   On: 11/12/2022 12:57   DG Chest 2 View  Result Date: 11/12/2022 CLINICAL DATA:  Chest pain EXAM: CHEST - 2 VIEW COMPARISON:  CXR 10/30/20. FINDINGS: No pleural effusion. No pneumothorax. No focal airspace opacity. Normal cardiac and mediastinal contours. No radiographically apparent displaced rib fractures. Visualized upper abdomen is unremarkable. Surgical clips in the right axilla. Vertebral body heights are maintained. IMPRESSION: No focal airspace opacity Electronically Signed   By: Lorenza Cambridge M.D.   On: 11/12/2022 09:42    Assessment and Plan:   Chest pain with elevated troponin She has risk factors and family history with chest pain episode and mildly positive troponin. Her chest pain has subsided now though. She needs coronary evaluation but would be a good case for coronary Cta to potentially avoid invasive procedure given her symptoms have stopped and trops mild. Would also get an echo.   Recommendations: - please order Coronary CTa - echo ordered  - loaded 325 mg ASA; continue 81 mg daily for now - check lipid panel and LP(a) - continue home atenolol - check CBC, CMP, INR, hemoglobin A1c, tsh/FT4 - sublingual NTG PRN for pain       For questions or updates, please contact [facility] Please consult [email or web address] for contact info under     Signed, Joellen Jersey, MD  11/13/2022 1:07 AM

## 2022-11-13 NOTE — Progress Notes (Signed)
Attempted Echocardiogram, patient went to Cath Lab, will try later.

## 2022-11-13 NOTE — H&P (View-Only) (Signed)
Rounding Note    Patient Name: Claire Guzman Date of Encounter: 11/13/2022  [facility] Cardiologist: New to [place]   Subjective   Pt presents with chest pain, left arm pain  Trops have increased to 44. Presenting ECG showed very mild ST depression in lateral leads  CP and ECG changes resolved within 30 min  Inpatient Medications    Scheduled Meds:  ALPRAZolam  0.5 mg Oral Once   atenolol  50 mg Oral QHS   pantoprazole  40 mg Oral Daily   potassium chloride SA  20 mEq Oral Daily   venlafaxine XR  150 mg Oral Q breakfast   Continuous Infusions:  PRN Meds: acetaminophen **OR** acetaminophen, ALPRAZolam, melatonin, nitroGLYCERIN, ondansetron (ZOFRAN) IV   Vital Signs    Vitals:   11/12/22 2118 11/13/22 0034 11/13/22 0527 11/13/22 0822  BP: (!) 144/89 (!) 140/70 124/68 124/61  Pulse: 65 65 65   Resp: Temp: 98 F (36.7 C) 98 F (36.7 C) 98 F (36.7 C) 97.6 F (36.4 C)  TempSrc: Oral Oral Oral Axillary  SpO2:  100% 100% 97%  Weight: 73.2 kg  73.1 kg     Intake/Output Summary (Last 24 hours) at 11/13/2022 0924 Last data filed at 11/13/2022 0020 Gross per 24 hour  Intake 520 ml  Output --  Net 520 ml      11/13/2022    5:27 AM 11/12/2022    9:18 PM 10/22/2022    9:40 AM  Last 3 Weights  Weight (lbs) 161 lb 3.2 oz 161 lb 4.8 oz 165 lb  Weight (kg) 73.12 kg 73.165 kg 74.844 kg      Telemetry    NSR  - Personally Reviewed  ECG    NSR , minimal ST depression in V5,V6. Next ECG shows resolution of these changes.  - Personally Reviewed  Physical Exam    GEN: No acute distress.   Neck: No JVD Cardiac: RRR, no murmurs, rubs, or gallops.  S/p R mastectomy  Respiratory: Clear to auscultation bilaterally. GI: Soft, nontender, non-distended  MS: No edema; No deformity. Neuro:  Nonfocal  Psych: Normal affect   Labs    High Sensitivity Troponin:   Recent Labs  Lab 11/12/22 0915 11/12/22 1124 11/12/22 1332 11/13/22 0632   TROPONINIHS 9 26* 38* 44*     Chemistry Recent Labs  Lab 11/12/22 0915 11/13/22 0632  NA 139 140  K 3.5 3.3*  CL 103 105  CO2 25 27  GLUCOSE 102* 104*  BUN 9 12  CREATININE 0.58 0.64  CALCIUM 9.5 8.9  MG  --  2.0  PROT  --  6.6  ALBUMIN  --  3.7  AST  --  20  ALT  --  16  ALKPHOS  --  71  BILITOT  --  0.6  GFRNONAA >60 >60  ANIONGAP 11 8    Lipids No results for input(s): "CHOL", "TRIG", "HDL", "LABVLDL", "LDLCALC", "CHOLHDL" in the last 168 hours.  Hematology Recent Labs  Lab 11/12/22 0915 11/13/22 0044  WBC 5.5 6.6  RBC 3.99 3.99  HGB 12.9 12.6  HCT 34.6* 35.6*  MCV 86.7 89.2  MCH 32.3 31.6  MCHC 37.3* 35.4  RDW 12.0 12.2  PLT 170 192   Thyroid No results for input(s): "TSH", "FREET4" in the last 168 hours.  BNPNo results for input(s): "BNP", "PROBNP" in the last 168 hours.  DDimer  Recent Labs  Lab 11/12/22 [phone]  DDIMER 0.43  Radiology    US Abdomen Limited RUQ (LIVER/GB)  Result Date: 11/12/2022 CLINICAL DATA:  Right upper quadrant abdominal pain EXAM: ULTRASOUND ABDOMEN LIMITED RIGHT UPPER QUADRANT COMPARISON:  None Available. FINDINGS: Gallbladder: There is a 2.2 cm gallstone at the gallbladder neck. Gallbladder wall measures up to 4 mm. No sonographic Murphy sign noted by sonographer. Common bile duct: Diameter: 4 mm, which is normal Liver: No focal lesion identified. Within normal limits in parenchymal echogenicity. Portal vein is patent on color Doppler imaging with normal direction of blood flow towards the liver. Other: None. IMPRESSION: Cholelithiasis with equivocal findings for cholecystitis (the sonographic Murphy sign was negative). A HIDA scan could be performed for more definitive characterization. Electronically Signed   By: Lorenza Cambridge M.D.   On: 11/12/2022 12:57   DG Chest 2 View  Result Date: 11/12/2022 CLINICAL DATA:  Chest pain EXAM: CHEST - 2 VIEW COMPARISON:  CXR 10/30/20. FINDINGS: No pleural effusion. No pneumothorax. No focal  airspace opacity. Normal cardiac and mediastinal contours. No radiographically apparent displaced rib fractures. Visualized upper abdomen is unremarkable. Surgical clips in the right axilla. Vertebral body heights are maintained. IMPRESSION: No focal airspace opacity Electronically Signed   By: Lorenza Cambridge M.D.   On: 11/12/2022 09:42    Cardiac Studies      Patient Profile     57 y.o. female    Assessment & Plan     1.  Unstable angina: The patient presents with left arm pain and pressure and indigestion-like sensation.  She is also had some left neck pain.  This was associated with very subtle ST segment depression in the lateral leads.  The chest pain and EKG changes resolved after 30 minutes.  She has a very strong family history of coronary artery disease.  At this point I think heart best option is to proceed with heart catheterization.  We discussed the risk, benefits, options of heart catheterization.  She understands and agrees to proceed.  2.  HLD  her Last LDL was 113.  Starting atorva 80      For questions or updates, please contact [facility] Please consult [email or web address] for contact info under        Signed, Kristeen Miss, MD  11/13/2022, 9:24 AM

## 2022-11-13 NOTE — Progress Notes (Signed)
D/C home by day shift, all paperwork and AVS given to patient and husband by day shift RN : bp 127/69, map 85. Temp 98.2 . SPo2 98

## 2022-11-13 NOTE — Discharge Summary (Signed)
Physician Discharge Summary  Claire Guzman WUJ:[id number] DOB: [birth year] DOA: 11/12/2022  PCP: Medicine, [facility]  Admit date: 11/12/2022 Discharge date: 11/13/2022  Admitted From: Home  Discharge disposition: Home with   Recommendations for Outpatient Follow-Up:   Follow up with your primary care provider in one week.  Check CBC, BMP, magnesium in the next visit   Discharge Diagnosis:   Principal Problem:   Chest pain Active Problems:   Elevated troponin   Essential hypertension   GERD (gastroesophageal reflux disease)   GAD (generalized anxiety disorder)  Discharge Condition: Improved.  Diet recommendation: Low sodium, heart healthy.  .  Wound care: None.  Code status: Full.   History of Present Illness:   Claire Guzman is a 57 y.o. female with medical history significant for essential hypertension, GERD, GAD, presented to [facility] emergency department with chest pain radiating to the left shoulder.  No history of coronary artery disease.  Patient did have previous echocardiogram in 2020 with preserved LV function.  Patient does have history of GERD on Protonix and generalized anxiety disorder on Effexor and as needed Xanax.  In the ED, patient had mildly elevated troponins and cardiology was consulted who recommended observation in the hospital.    Hospital Course:   Following conditions were addressed during hospitalization as listed below,  Chest Pain  Improved at this time.  Heart score of 4.  Cardiology has seen the patient.  EKG with subtle ST depression in lateral leads.  Patient with high coronary risk factors.  Mildly elevated flat troponins.   Lipase 79.   Troponins at 26- 38-44. Continue beta-blocker, Protonix and Xanax from home.   Patient underwent cardiac catheterization with findings of mild coronary artery disease.  Recommendation is to continue aspirin and statins on discharge.    Essential Hypertension:  Continue home beta-blocker.     GERD: Continue PPI.   Generalized anxiety disorder:  Continue Effexor as well as prn Xanax.  Disposition.  At this time, patient is stable for disposition home with outpatient PCP follow-up.  Medical Consultants:   Cardiology  Procedures:    Cardiac catheterization 11/13/2022 Subjective:   Today, patient was seen and examined at bedside.  Denies any nausea vomiting chest pain shortness of breath or diaphoresis.  Seen before cardiac catheterization.  Discharge Exam:   Vitals:   11/13/22 1412 11/13/22 1500  BP:  122/64  Pulse:  68  Resp:  18  Temp:  98.5 F (36.9 C)  SpO2: 98% 97%   Vitals:   11/13/22 0943 11/13/22 1106 11/13/22 1412 11/13/22 1500  BP:  132/69  122/64  Pulse:  68  68  Resp:  18  18  Temp:  98 F (36.7 C)  98.5 F (36.9 C)  TempSrc:  Oral  Oral  SpO2:   98% 97%  Weight: 74.8 kg       General: Alert awake, not in obvious distress HENT: pupils equally reacting to light,  No scleral pallor or icterus noted. Oral mucosa is moist.  Chest:  Clear breath sounds.  Diminished breath sounds bilaterally. No crackles or wheezes.  CVS: S1 &S2 heard. No murmur.  Regular rate and rhythm. Abdomen: Soft, nontender, nondistended.  Bowel sounds are heard.   Extremities: No cyanosis, clubbing or edema.  Peripheral pulses are palpable. Psych: Alert, awake and oriented, normal mood CNS:  No cranial nerve deficits.  Power equal in all extremities.   Skin: Warm and dry.  No rashes noted.  The  results of significant diagnostics from this hospitalization (including imaging, microbiology, ancillary and laboratory) are listed below for reference.     Diagnostic Studies:   CARDIAC CATHETERIZATION  Result Date: 11/13/2022   Suezanne Jacquet Cx to Prox Cx lesion is 20% stenosed.   The left ventricular systolic function is normal.   LV end diastolic pressure is normal.   The left ventricular ejection fraction is 55-65% by visual estimate.   There is no  mitral valve regurgitation. Minimal luminal irregularities in the RCA, Circumflex and LAD No evidence of obstructive CAD No evidence of spontaneous coronary dissection Normal LV systolic function Normal LV filling pressures Recommendations: Medical management of mild CAD. Unclear etiology of mild elevation in troponin and chest pain. Consider coronary vasospasm.   US Abdomen Limited RUQ (LIVER/GB)  Result Date: 11/12/2022 CLINICAL DATA:  Right upper quadrant abdominal pain EXAM: ULTRASOUND ABDOMEN LIMITED RIGHT UPPER QUADRANT COMPARISON:  None Available. FINDINGS: Gallbladder: There is a 2.2 cm gallstone at the gallbladder neck. Gallbladder wall measures up to 4 mm. No sonographic Murphy sign noted by sonographer. Common bile duct: Diameter: 4 mm, which is normal Liver: No focal lesion identified. Within normal limits in parenchymal echogenicity. Portal vein is patent on color Doppler imaging with normal direction of blood flow towards the liver. Other: None. IMPRESSION: Cholelithiasis with equivocal findings for cholecystitis (the sonographic Murphy sign was negative). A HIDA scan could be performed for more definitive characterization. Electronically Signed   By: Lorenza Cambridge M.D.   On: 11/12/2022 12:57   DG Chest 2 View  Result Date: 11/12/2022 CLINICAL DATA:  Chest pain EXAM: CHEST - 2 VIEW COMPARISON:  CXR 10/30/20. FINDINGS: No pleural effusion. No pneumothorax. No focal airspace opacity. Normal cardiac and mediastinal contours. No radiographically apparent displaced rib fractures. Visualized upper abdomen is unremarkable. Surgical clips in the right axilla. Vertebral body heights are maintained. IMPRESSION: No focal airspace opacity Electronically Signed   By: Lorenza Cambridge M.D.   On: 11/12/2022 09:42     Labs:   Basic Metabolic Panel: Recent Labs  Lab 11/12/22 0915 11/13/22 0632  NA 139 140  K 3.5 3.3*  CL 103 105  CO2 25 27  GLUCOSE 102* 104*  BUN 9 12  CREATININE 0.58 0.64  CALCIUM  9.5 8.9  MG  --  2.0   GFR Estimated Creatinine Clearance: 78.6 mL/min (by C-G formula based on SCr of 0.64 mg/dL). Liver Function Tests: Recent Labs  Lab 11/13/22 0632  AST 20  ALT 16  ALKPHOS 71  BILITOT 0.6  PROT 6.6  ALBUMIN 3.7   Recent Labs  Lab 11/13/22 0632  LIPASE 79*   No results for input(s): "AMMONIA" in the last 168 hours. Coagulation profile No results for input(s): "INR", "PROTIME" in the last 168 hours.  CBC: Recent Labs  Lab 11/12/22 0915 11/13/22 0044  WBC 5.5 6.6  NEUTROABS  --  3.2  HGB 12.9 12.6  HCT 34.6* 35.6*  MCV 86.7 89.2  PLT 170 192   Cardiac Enzymes: No results for input(s): "CKTOTAL", "CKMB", "CKMBINDEX", "TROPONINI" in the last 168 hours. BNP: Invalid input(s): "POCBNP" CBG: No results for input(s): "GLUCAP" in the last 168 hours. D-Dimer Recent Labs    11/12/22 0943  DDIMER 0.43   Hgb A1c No results for input(s): "HGBA1C" in the last 72 hours. Lipid Profile No results for input(s): "CHOL", "HDL", "LDLCALC", "TRIG", "CHOLHDL", "LDLDIRECT" in the last 72 hours. Thyroid function studies No results for input(s): "TSH", "T4TOTAL", "T3FREE", "THYROIDAB" in the  last 72 hours.  Invalid input(s): "FREET3" Anemia work up No results for input(s): "VITAMINB12", "FOLATE", "FERRITIN", "TIBC", "IRON", "RETICCTPCT" in the last 72 hours. Microbiology No results found for this or any previous visit (from the past 240 hour(s)).   Discharge Instructions:   Discharge Instructions     Diet - low sodium heart healthy   Complete by: As directed    Discharge instructions   Complete by: As directed    Follow-up with your primary care provider in 1 week.  Seek medical attention for worsening symptoms.  Okay to take Tylenol for mild pain.   Increase activity slowly   Complete by: As directed       Allergies as of 11/13/2022       Reactions   Trazodone And Nefazodone Other (See Comments)   Facial swelling and feet swelling, nasal  congestion   Metrogel [metronidazole]    Burned her face   Other Other (See Comments)   Dental Cement--tongue and cheek burning   Paxil [paroxetine Hcl]    Lethargic/felt bad   Sulfamethoxazole Swelling   Zomig [zolmitriptan]    Elevated blood pressure   Prednisone    Turns skin red, heart racing- side effect        Medication List     STOP taking these medications    lactulose 10 GM/15ML solution Commonly known as: CHRONULAC   methocarbamol 750 MG tablet Commonly known as: ROBAXIN   traMADol 50 MG tablet Commonly known as: ULTRAM       TAKE these medications    ALPRAZolam 0.5 MG tablet Commonly known as: XANAX TAKE 1 TABLET(0.5 MG) BY MOUTH TWICE DAILY AS NEEDED FOR ANXIETY   aspirin EC 81 MG tablet Take 1 tablet (81 mg total) by mouth daily. Swallow whole. Start taking on: November 14, 2022   atenolol 50 MG tablet Commonly known as: TENORMIN Take 50 mg by mouth at bedtime.   atorvastatin 40 MG tablet Commonly known as: LIPITOR Take 1 tablet (40 mg total) by mouth daily. Start taking on: November 14, 2022   hydroxychloroquine 200 MG tablet Commonly known as: Plaquenil Take 1 tablet 200 mg BID Monday-Friday   ibuprofen 800 MG tablet Commonly known as: ADVIL Take 1 tablet (800 mg total) by mouth every 8 (eight) hours as needed.   mupirocin ointment 2 % Commonly known as: BACTROBAN Apply 1 Application topically 2 (two) times daily.   pantoprazole 40 MG tablet Commonly known as: PROTONIX Take by mouth.   potassium chloride SA 20 MEQ tablet Commonly known as: KLOR-CON M TAKE 1 TABLET(20 MEQ) BY MOUTH DAILY   valACYclovir 500 MG tablet Commonly known as: VALTREX valacyclovir 500 mg tablet  TAKE 1 TABLET BY MOUTH TWICE DAILY   venlafaxine XR 150 MG 24 hr capsule Commonly known as: EFFEXOR-XR TAKE 1 CAPSULE(150 MG) BY MOUTH DAILY WITH BREAKFAST   vitamin C 1000 MG tablet Take 2,000 mg by mouth daily.   Vitamin D3 50 MCG (2000 UT) capsule Take  2,000 Units by mouth daily.          Time coordinating discharge: 39 minutes  Signed:  Emmalea Treanor  [facility] 11/13/2022, 3:40 PM

## 2022-11-13 NOTE — Interval H&P Note (Signed)
History and Physical Interval Note:  11/13/2022 2:10 PM  Claire Guzman  has presented today for surgery, with the diagnosis of chest pain.  The various methods of treatment have been discussed with the patient and family. After consideration of risks, benefits and other options for treatment, the patient has consented to  Procedure(s): LEFT HEART CATH AND CORONARY ANGIOGRAPHY (N/A) as a surgical intervention.  The patient's history has been reviewed, patient examined, no change in status, stable for surgery.  I have reviewed the patient's chart and labs.  Questions were answered to the patient's satisfaction.    Cath Lab Visit (complete for each Cath Lab visit)  Clinical Evaluation Leading to the Procedure:   ACS: Yes.    Non-ACS:    Anginal Classification: CCS III  Anti-ischemic medical therapy: Beta blocker  Non-Invasive Test Results: No non-invasive testing performed  Prior CABG: No previous CABG        Verne Carrow

## 2022-11-13 NOTE — Progress Notes (Addendum)
Dr. Clifton James relayed cath result to Dr. Elease Hashimoto and Dr. Tyson Babinski. Per their discussions, plan are for ASA, statin, outpatient echo and f/u. I have arranged echo and follow-up appt and put info on AVS. Also outlined basic post cath instructions on Discharge Instructions section. Spoke with pt regarding to expect the appts outlined on AVS. Dr. Elease Hashimoto also plans to go by her room to discuss cath.

## 2022-11-13 NOTE — Discharge Instructions (Signed)
Post Cath Instructions: No driving for 2 days. No lifting over 5 lbs for 1 week. No sexual activity for 1 week. Keep procedure site clean & dry. If you notice increased pain, swelling, bleeding or pus, call/return!  You may shower, but no soaking baths/hot tubs/pools for 1 week.

## 2022-11-13 NOTE — Progress Notes (Signed)
Rounding Note    Patient Name: Claire Guzman Date of Encounter: 11/13/2022  [facility] Cardiologist: New to [place]   Subjective   Pt presents with chest pain, left arm pain  Trops have increased to 44. Presenting ECG showed very mild ST depression in lateral leads  CP and ECG changes resolved within 30 min  Inpatient Medications    Scheduled Meds:  ALPRAZolam  0.5 mg Oral Once   atenolol  50 mg Oral QHS   pantoprazole  40 mg Oral Daily   potassium chloride SA  20 mEq Oral Daily   venlafaxine XR  150 mg Oral Q breakfast   Continuous Infusions:  PRN Meds: acetaminophen **OR** acetaminophen, ALPRAZolam, melatonin, nitroGLYCERIN, ondansetron (ZOFRAN) IV   Vital Signs    Vitals:   11/12/22 2118 11/13/22 0034 11/13/22 0527 11/13/22 0822  BP: (!) 144/89 (!) 140/70 124/68 124/61  Pulse: 65 65 65   Resp: Temp: 98 F (36.7 C) 98 F (36.7 C) 98 F (36.7 C) 97.6 F (36.4 C)  TempSrc: Oral Oral Oral Axillary  SpO2:  100% 100% 97%  Weight: 73.2 kg  73.1 kg     Intake/Output Summary (Last 24 hours) at 11/13/2022 0924 Last data filed at 11/13/2022 0020 Gross per 24 hour  Intake 520 ml  Output --  Net 520 ml      11/13/2022    5:27 AM 11/12/2022    9:18 PM 10/22/2022    9:40 AM  Last 3 Weights  Weight (lbs) 161 lb 3.2 oz 161 lb 4.8 oz 165 lb  Weight (kg) 73.12 kg 73.165 kg 74.844 kg      Telemetry    NSR  - Personally Reviewed  ECG    NSR , minimal ST depression in V5,V6. Next ECG shows resolution of these changes.  - Personally Reviewed  Physical Exam    GEN: No acute distress.   Neck: No JVD Cardiac: RRR, no murmurs, rubs, or gallops.  S/p R mastectomy  Respiratory: Clear to auscultation bilaterally. GI: Soft, nontender, non-distended  MS: No edema; No deformity. Neuro:  Nonfocal  Psych: Normal affect   Labs    High Sensitivity Troponin:   Recent Labs  Lab 11/12/22 0915 11/12/22 1124 11/12/22 1332 11/13/22 0632   TROPONINIHS 9 26* 38* 44*     Chemistry Recent Labs  Lab 11/12/22 0915 11/13/22 0632  NA 139 140  K 3.5 3.3*  CL 103 105  CO2 25 27  GLUCOSE 102* 104*  BUN 9 12  CREATININE 0.58 0.64  CALCIUM 9.5 8.9  MG  --  2.0  PROT  --  6.6  ALBUMIN  --  3.7  AST  --  20  ALT  --  16  ALKPHOS  --  71  BILITOT  --  0.6  GFRNONAA >60 >60  ANIONGAP 11 8    Lipids No results for input(s): "CHOL", "TRIG", "HDL", "LABVLDL", "LDLCALC", "CHOLHDL" in the last 168 hours.  Hematology Recent Labs  Lab 11/12/22 0915 11/13/22 0044  WBC 5.5 6.6  RBC 3.99 3.99  HGB 12.9 12.6  HCT 34.6* 35.6*  MCV 86.7 89.2  MCH 32.3 31.6  MCHC 37.3* 35.4  RDW 12.0 12.2  PLT 170 192   Thyroid No results for input(s): "TSH", "FREET4" in the last 168 hours.  BNPNo results for input(s): "BNP", "PROBNP" in the last 168 hours.  DDimer  Recent Labs  Lab 11/12/22 [phone]  DDIMER 0.43  Radiology    US Abdomen Limited RUQ (LIVER/GB)  Result Date: 11/12/2022 CLINICAL DATA:  Right upper quadrant abdominal pain EXAM: ULTRASOUND ABDOMEN LIMITED RIGHT UPPER QUADRANT COMPARISON:  None Available. FINDINGS: Gallbladder: There is a 2.2 cm gallstone at the gallbladder neck. Gallbladder wall measures up to 4 mm. No sonographic Murphy sign noted by sonographer. Common bile duct: Diameter: 4 mm, which is normal Liver: No focal lesion identified. Within normal limits in parenchymal echogenicity. Portal vein is patent on color Doppler imaging with normal direction of blood flow towards the liver. Other: None. IMPRESSION: Cholelithiasis with equivocal findings for cholecystitis (the sonographic Murphy sign was negative). A HIDA scan could be performed for more definitive characterization. Electronically Signed   By: Hemant  Desai M.D.   On: 11/12/2022 12:57   DG Chest 2 View  Result Date: 11/12/2022 CLINICAL DATA:  Chest pain EXAM: CHEST - 2 VIEW COMPARISON:  CXR 10/30/20. FINDINGS: No pleural effusion. No pneumothorax. No focal  airspace opacity. Normal cardiac and mediastinal contours. No radiographically apparent displaced rib fractures. Visualized upper abdomen is unremarkable. Surgical clips in the right axilla. Vertebral body heights are maintained. IMPRESSION: No focal airspace opacity Electronically Signed   By: Hemant  Desai M.D.   On: 11/12/2022 09:42    Cardiac Studies      Patient Profile     57 y.o. female    Assessment & Plan     1.  Unstable angina: The patient presents with left arm pain and pressure and indigestion-like sensation.  She is also had some left neck pain.  This was associated with very subtle ST segment depression in the lateral leads.  The chest pain and EKG changes resolved after 30 minutes.  She has a very strong family history of coronary artery disease.  At this point I think heart best option is to proceed with heart catheterization.  We discussed the risk, benefits, options of heart catheterization.  She understands and agrees to proceed.  2.  HLD  her Last LDL was 113.  Starting atorva 80      For questions or updates, please contact [facility] Please consult [email or web address] for contact info under        Signed, Shavaughn Seidl, MD  11/13/2022, 9:24 AM    

## 2022-11-14 ENCOUNTER — Encounter (HOSPITAL_COMMUNITY): Payer: Self-pay | Admitting: Cardiovascular Disease

## 2022-11-17 ENCOUNTER — Other Ambulatory Visit: Payer: Self-pay | Admitting: Physician Assistant

## 2022-11-17 DIAGNOSIS — M359 Systemic involvement of connective tissue, unspecified: Secondary | ICD-10-CM

## 2022-11-17 NOTE — Telephone Encounter (Signed)
Last Fill: 09/03/2022  Eye exam:  07/30/2022 WNL    Labs: 11/13/2022 Hct 35.6, Potassium 3.3. Glucose 104  Next Visit: 01/27/2023  Last Visit: 10/22/2022  DX: Undifferentiated connective tissue disease   Current Dose per office note 10/22/2022: Plaquenil  1 tablet by mouth twice daily Monday through Friday   Okay to refill Plaquenil?

## 2022-11-18 NOTE — Progress Notes (Signed)
Virtual Visit via Video Note   Because of Claire Guzman's co-morbid illnesses, she is at least at moderate risk for complications without adequate follow up.  This format is felt to be most appropriate for this patient at this time.  All issues noted in this document were discussed and addressed.  A limited physical exam was performed with this format.  Please refer to the patient's chart for her consent to telehealth for [facility].       Date:  11/27/2022   ID:  Claire Guzman, DOB 11-05-1965, MRN [id number] The patient was identified using 2 identifiers.  Patient Location: Home Provider Location: Office/Clinic   PCP:  Medicine, [facility] Providers Cardiologist:  Kristeen Miss, MD     Evaluation Performed:  Follow-Up Visit  Chief Complaint:  hospital follow-up  History of Present Illness:    Claire Guzman is a 57 y.o. female with hypertension, right breast cancer s/p mastectomy, connective tissue disease, family history of CAD, generalized anxiety disorder, and GERD.   Presented to [facility] ED on 11/12/2022 with chest pain the morning prior to admission described as substernal and central pressure radiating to left arm and shoulder. No prior cardiac history. Pain persisted through the day but had subsided by the time she arrived in ED. reported recent unusual episodes of left arm pain, dizziness, and left jaw pain although on separate occasions.  Cardiology was consulted and initially Dr. Ayesha Rumpf recommended coronary CTA. A few hours later, patient was assessed by Dr. Elease Hashimoto and due to continuing rise in hs troponin 26 >>38 >>44  and EKG that showed very mild ST depression in lateral leads, he felt symptoms were consistent with unstable angina and coronary catheterization was planned.  She underwent LHC on 11/14/2022 that showed minimal luminal irregularities in the RCA, circumflex, and LAD, no evidence of  obstructive CAD, no evidence of spontaneous coronary dissection, normal LV systolic function, normal LV filling pressures. Advised to start high dose statin, and scheduled for outpatient echocardiogram and follow-up with cardiology.  Today, she is seen for follow-up.  She is at home with her husband who recently fell and broke his wrist. Reports she has been feeling well with occasional shoulder pain.  It is different than the discomfort she felt prior to going to ED on 4/17.  Was told by chiropractor it is a pinched nerve. We discussed the symptoms that prompted her to go to the ED and the results of her cardiac testing.  All questions were answered to her satisfaction.  She denies chest pain, shortness of breath, palpitations, orthopnea, PND, presyncope, syncope.  Past Medical History:  Diagnosis Date   Abdominal pain, unspecified site    Anxiety state, unspecified    Complication of anesthesia    convulsions - when she has appendectomy about 12 years ago at [facility]   Contact dermatitis and other eczema, due to unspecified cause    Essential hypertension, benign    Family history of colon cancer    Family history of melanoma    Family history of prostate cancer    GERD (gastroesophageal reflux disease)    Hematuria, unspecified    Hypertension    Lower back pain    Mixed hyperlipidemia    Rosacea    rt breast ca dx'd 01/2018   R breast cancer   Unspecified symptom associated with female genital organs    Past Surgical History:  Procedure Laterality Date  BREAST RECONSTRUCTION WITH PLACEMENT OF TISSUE EXPANDER AND ALLODERM Right 09/14/2018   Procedure: BREAST RECONSTRUCTION WITH PLACEMENT OF TISSUE EXPANDER AND ALLODERM;  Surgeon: Glenna Fellows, MD;  Location: [facility] OR;  Service: Plastics;  Laterality: Right;   CESAREAN SECTION     DIAGNOSTIC LAPAROSCOPY     EYE SURGERY     Lasik   LAPAROSCOPIC APPENDECTOMY     LEFT HEART CATH AND CORONARY ANGIOGRAPHY N/A 11/13/2022    Procedure: LEFT HEART CATH AND CORONARY ANGIOGRAPHY;  Surgeon: Kathleene Hazel, MD;  Location: [facility] INVASIVE CV [facility];  Service: Cardiovascular;  Laterality: N/A;   LIPOSUCTION WITH LIPOFILLING Right 07/26/2019   Procedure: LIPOFILLING from abdomen to R chest;  Surgeon: Glenna Fellows, MD;  Location: [facility];  Service: Plastics;  Laterality: Right;   MASTECTOMY WITH RADIOACTIVE SEED GUIDED EXCISION AND AXILLARY SENTINEL LYMPH NODE BIOPSY Right 09/14/2018   Procedure: RIGHT NIPPLE SPARING MASTECTOMY WITH TARGETED DEEP RIGHT AXILLARY LYMPH NODE BIOPSY WITH RADIOACTIVE SEED LOCALIZATION AND RIGHT AXILLARY SENTINEL LYMPH NODE BIOPSY, FROZEN SECTION AND POSSIBLE COMPLETION AXILLARY LYMPH NODE DISSECTION INJECT BLUE DYE RIGHT BREAST;  Surgeon: Claud Kelp, MD;  Location: [facility] OR;  Service: General;  Laterality: Right;   PLANTAR FASCIA SURGERY Left 04/17/2022   PORT-A-CATH REMOVAL Left 07/26/2019   Procedure: REMOVAL PORT-A-CATH;  Surgeon: Glenna Fellows, MD;  Location: [facility];  Service: Plastics;  Laterality: Left;   PORTACATH PLACEMENT Left 04/13/2018   Procedure: INSERTION PORT-A-CATH WITH [place];  Surgeon: Claud Kelp, MD;  Location: [facility];  Service: General;  Laterality: Left;   REMOVAL OF TISSUE EXPANDER AND PLACEMENT OF IMPLANT Right 07/26/2019   Procedure: REMOVAL OF TISSUE EXPANDER AND PLACEMENT OF IMPLANT;  Surgeon: Glenna Fellows, MD;  Location:  SURGERY CENTER;  Service: Plastics;  Laterality: Right;   UTERINE FIBROID SURGERY       Current Meds  Medication Sig   ALPRAZolam (XANAX) 0.5 MG tablet TAKE 1 TABLET(0.5 MG) BY MOUTH TWICE DAILY AS NEEDED FOR ANXIETY   aspirin EC 81 MG tablet Take 1 tablet (81 mg total) by mouth daily. Swallow whole.   atenolol (TENORMIN) 50 MG tablet Take 50 mg by mouth at bedtime.   atorvastatin (LIPITOR) 40 MG tablet Take 1 tablet (40 mg total) by mouth daily.   hydroxychloroquine  (PLAQUENIL) 200 MG tablet TAKE 1 TABLET TWICE DAILY MONDAY-FRIDAY   ibuprofen (ADVIL) 800 MG tablet Take 1 tablet (800 mg total) by mouth every 8 (eight) hours as needed.   pantoprazole (PROTONIX) 40 MG tablet Take 40 mg by mouth daily.   potassium chloride SA (KLOR-CON M) 20 MEQ tablet TAKE 1 TABLET(20 MEQ) BY MOUTH DAILY   valACYclovir (VALTREX) 500 MG tablet Take 500 mg by mouth daily as needed (For outbreaks).   venlafaxine XR (EFFEXOR-XR) 150 MG 24 hr capsule TAKE 1 CAPSULE(150 MG) BY MOUTH DAILY WITH BREAKFAST     Allergies:   Trazodone and nefazodone, Metrogel [metronidazole], Other, Paxil [paroxetine hcl], Sulfamethoxazole, Zomig [zolmitriptan], and Prednisone   Social History   Tobacco Use   Smoking status: Never    Passive exposure: Never   Smokeless tobacco: Never  Vaping Use   Vaping Use: Never used  Substance Use Topics   Alcohol use: No   Drug use: Never     Family Hx: The patient's family history includes Atrial fibrillation in her mother; Colon cancer in her paternal aunt, paternal grandfather, and paternal uncle; Healthy in her daughter; Heart Problems in her maternal  aunt and maternal uncle; Heart attack in her maternal grandfather and maternal grandmother; Heart failure in her mother; Melanoma (age of onset: 86) in her mother; Prostate cancer (age of onset: 36) in her father; Stroke in her father and paternal grandmother; Throat cancer in her father.  ROS:   Please see the history of present illness.  All other systems reviewed and are negative.   Prior CV studies:   The following studies were reviewed today:  Echo 11/19/22 1. Left ventricular ejection fraction, by estimation, is 55 to 60%. The  left ventricle has normal function. The left ventricle has no regional  wall motion abnormalities. Left ventricular diastolic parameters are  consistent with Grade I diastolic  dysfunction (impaired relaxation).   2. Right ventricular systolic function is normal. The  right ventricular  size is normal.   3. The mitral valve is normal in structure. Trivial mitral valve  regurgitation. No evidence of mitral stenosis.   4. The aortic valve is tricuspid. Aortic valve regurgitation is not  visualized. Aortic valve sclerosis is present, with no evidence of aortic  valve stenosis.  LHC 11/14/22   Ost Cx to Prox Cx lesion is 20% stenosed.   The left ventricular systolic function is normal.   LV end diastolic pressure is normal.   The left ventricular ejection fraction is 55-65% by visual estimate.   There is no mitral valve regurgitation.   Minimal luminal irregularities in the RCA, Circumflex and LAD No evidence of obstructive CAD No evidence of spontaneous coronary dissection Normal LV systolic function Normal LV filling pressures   Recommendations: Medical management of mild CAD. Unclear etiology of mild elevation in troponin and chest pain. Consider coronary vasospasm.     Labs/Other Tests and Data Reviewed:    EKG:  No ECG reviewed.  Recent Labs: 11/13/2022: ALT 16; BUN 12; Creatinine, Ser 0.64; Hemoglobin 12.6; Magnesium 2.0; Platelets 192; Potassium 3.3; Sodium 140   Recent Lipid Panel No results found for: "CHOL", "TRIG", "HDL", "CHOLHDL", "LDLCALC", "LDLDIRECT"  Wt Readings from Last 3 Encounters:  11/27/22 163 lb (73.9 kg)  11/13/22 164 lb 14.5 oz (74.8 kg)  10/22/22 165 lb (74.8 kg)     Risk Assessment/Calculations:          Objective:    Vital Signs:  Ht 5\' 4"  (1.626 m)   Wt 163 lb (73.9 kg)   LMP 03/28/2018 (Within Days)   BMI 27.98 kg/m    VITAL SIGNS:  reviewed GEN:  no acute distress RESPIRATORY:  normal respiratory effort, symmetric expansion SKIN:  no rash, lesions or ulcers. MUSCULOSKELETAL:  no obvious deformities.  ASSESSMENT & PLAN:    CAD without angina: LHC 11/13/22 revealed minimal luminal irregularities in the RCA, circumflex, and LAD, no evidence of obstructive CAD, no evidence of spontaneous coronary  artery dissection. Consider coronary vasospasm. She has had no further episodes of chest discomfort. Discussed the etiology of vasospasm.  Encouraged her to gradually increase activity to achieve 150 minutes of moderate intensity exercise each week and monitor symptoms. Notify us if symptoms of chest discomfort occur with exertion.  As noted below, goal for LDL cholesterol < 70.  Continue secondary prevention with heart healthy, mostly plant-based diet.  Continue aspirin, atenolol. Starting statin as noted below.   Hyperlipidemia LDL goal < 70: LDL 80 on 08/22/2020.  This was not rechecked during hospitalization. I advised her not to start atorvastatin which was prescribed during hospitalization and to get fasting labs in 4 days. Advised that statin  therapy will be prescribed based on those results. I will also get particle number and lipoprotein a for further risk stratification.   Disposition: 2-3 months with Dr. Elease Hashimoto or me  Time:   Today, I have spent 15 minutes with the patient with telehealth technology discussing the above problems.     Medication Adjustments/Labs and Tests Ordered: Current medicines are reviewed at length with the patient today.  Concerns regarding medicines are outlined above.   Tests Ordered: Orders Placed This Encounter  Procedures   NMR LipoProf + Graph   Lipoprotein A (LPA)    Medication Changes: Meds ordered this encounter  Medications   atorvastatin (LIPITOR) 40 MG tablet    Sig: Take 1 tablet (40 mg total) by mouth daily.    Dispense:  90 tablet    Refill:  3    Order Specific Question:   Supervising Provider    Answer:   Vesta Mixer [[id number]]    Follow Up:  In Person in 2 month(s)  Signed, Levi Aland, NP  11/27/2022 6:00 PM    [facility]

## 2022-11-19 ENCOUNTER — Ambulatory Visit (HOSPITAL_COMMUNITY): Payer: Medicaid Other | Attending: Cardiovascular Disease

## 2022-11-19 DIAGNOSIS — R072 Precordial pain: Secondary | ICD-10-CM | POA: Diagnosis present

## 2022-11-19 LAB — ECHOCARDIOGRAM COMPLETE
Area-P 1/2: 3.56 cm2
S' Lateral: 3.2 cm

## 2022-11-19 MED ORDER — PERFLUTREN LIPID MICROSPHERE
1.0000 mL | INTRAVENOUS | Status: AC | PRN
Start: 2022-11-19 — End: 2022-11-19
  Administered 2022-11-19: 2 mL via INTRAVENOUS

## 2022-11-21 ENCOUNTER — Other Ambulatory Visit (HOSPITAL_BASED_OUTPATIENT_CLINIC_OR_DEPARTMENT_OTHER): Payer: Self-pay

## 2022-11-27 ENCOUNTER — Ambulatory Visit: Payer: Medicaid Other | Attending: Nurse Practitioner | Admitting: Nurse Practitioner

## 2022-11-27 ENCOUNTER — Encounter: Payer: Self-pay | Admitting: Nurse Practitioner

## 2022-11-27 VITALS — Ht 64.0 in | Wt 163.0 lb

## 2022-11-27 DIAGNOSIS — I2510 Atherosclerotic heart disease of native coronary artery without angina pectoris: Secondary | ICD-10-CM

## 2022-11-27 DIAGNOSIS — E785 Hyperlipidemia, unspecified: Secondary | ICD-10-CM

## 2022-11-27 MED ORDER — ATORVASTATIN CALCIUM 40 MG PO TABS: 40.0000 mg | ORAL_TABLET | Freq: Every day | ORAL | 3 refills | Status: AC

## 2022-11-27 NOTE — Patient Instructions (Signed)
Medication Instructions:  Your physician recommends that you continue on your current medications as directed. Please refer to the Current Medication list given to you today.  *If you need a refill on your cardiac medications before your next appointment, please call your pharmacy*   Lab Work: Lpa, NMR at [facility] If you have labs (blood work) drawn today and your tests are completely normal, you will receive your results only by: MyChart Message (if you have MyChart) OR A paper copy in the mail If you have any lab test that is abnormal or we need to change your treatment, we will call you to review the results.   Follow-Up: At [facility], you and your health needs are our priority.  As part of our continuing mission to provide you with exceptional heart care, we have created designated Provider Care Teams.  These Care Teams include your primary Cardiologist (physician) and Advanced Practice Providers (APPs -  Physician Assistants and Nurse Practitioners) who all work together to provide you with the care you need, when you need it.   Your next appointment:   July 2 at 0840   Provider:   Kristeen Miss, MD

## 2022-12-08 ENCOUNTER — Telehealth: Payer: Self-pay | Admitting: Physician Assistant

## 2022-12-08 ENCOUNTER — Other Ambulatory Visit: Payer: Self-pay | Admitting: *Deleted

## 2022-12-08 DIAGNOSIS — G8929 Other chronic pain: Secondary | ICD-10-CM

## 2022-12-08 NOTE — Telephone Encounter (Signed)
Claire Guzman called stating she is experiencing severe pain in her left shoulder and lower back. She is unsure if she should consult her PCP or schedule an appointment with Taylor/Deveshwar since this is related to her Lupus. She would like to discuss her concerns. She can be reached at [phone].

## 2022-12-08 NOTE — Telephone Encounter (Signed)
LMOM for patient, referral placed.

## 2022-12-08 NOTE — Telephone Encounter (Signed)
Patient states that she continues to have left shoulder joint discomfort.  She has nocturnal pain if she sleeps on her left shoulder.  Please refer her to orthopedics for evaluation.

## 2022-12-09 ENCOUNTER — Other Ambulatory Visit (INDEPENDENT_AMBULATORY_CARE_PROVIDER_SITE_OTHER): Payer: Medicaid Other

## 2022-12-09 ENCOUNTER — Encounter: Payer: Self-pay | Admitting: Sports Medicine

## 2022-12-09 ENCOUNTER — Ambulatory Visit (INDEPENDENT_AMBULATORY_CARE_PROVIDER_SITE_OTHER): Payer: Medicaid Other | Admitting: Sports Medicine

## 2022-12-09 DIAGNOSIS — G8929 Other chronic pain: Secondary | ICD-10-CM

## 2022-12-09 DIAGNOSIS — M542 Cervicalgia: Secondary | ICD-10-CM | POA: Diagnosis not present

## 2022-12-09 DIAGNOSIS — M62838 Other muscle spasm: Secondary | ICD-10-CM | POA: Diagnosis not present

## 2022-12-09 DIAGNOSIS — M19012 Primary osteoarthritis, left shoulder: Secondary | ICD-10-CM | POA: Diagnosis not present

## 2022-12-09 DIAGNOSIS — M25512 Pain in left shoulder: Secondary | ICD-10-CM

## 2022-12-09 DIAGNOSIS — M0579 Rheumatoid arthritis with rheumatoid factor of multiple sites without organ or systems involvement: Secondary | ICD-10-CM

## 2022-12-09 MED ORDER — BUPIVACAINE HCL 0.25 % IJ SOLN
2.0000 mL | INTRAMUSCULAR | Status: AC | PRN
Start: 2022-12-09 — End: 2022-12-09
  Administered 2022-12-09: 2 mL via INTRA_ARTICULAR

## 2022-12-09 MED ORDER — DICLOFENAC SODIUM 50 MG PO TBEC: 50.0000 mg | DELAYED_RELEASE_TABLET | Freq: Three times a day (TID) | ORAL | 1 refills | Status: DC

## 2022-12-09 MED ORDER — METHYLPREDNISOLONE ACETATE 40 MG/ML IJ SUSP
40.0000 mg | INTRAMUSCULAR | Status: AC | PRN
Start: 2022-12-09 — End: 2022-12-09
  Administered 2022-12-09: 40 mg via INTRA_ARTICULAR

## 2022-12-09 MED ORDER — LIDOCAINE HCL 1 % IJ SOLN
2.0000 mL | INTRAMUSCULAR | Status: AC | PRN
Start: 2022-12-09 — End: 2022-12-09
  Administered 2022-12-09: 2 mL

## 2022-12-09 NOTE — Progress Notes (Signed)
Claire Guzman - 57 y.o. female MRN [id number]  Date of birth: 1966/07/27  Office Visit Note: Visit Date: 12/09/2022 PCP: Luci Bank, MD Referred by: Pollyann Savoy, MD  Subjective: Chief Complaint  Patient presents with   Left Shoulder - Pain   Neck - Pain   HPI: Claire Guzman is a pleasant 57 y.o. female who presents today for neck pain and left arm radicular pain.  Patient works for [employer], does do a lot of upper extremity activity and repetitive lifting.  She has had 3 to 4 months of left shoulder, trapezius pain.  She gets pain at night when laying on that shoulder that will radiate down her arm to about the level of the wrist.  She denies any numbness or tingling or weakness.  Her pain will worsen with certain reaching motions as well as laying on that shoulder.  She takes ibuprofen 800 mg once daily as needed with some relief.  Review of labs from 05/27/22: + Anti-DNA Ab, + ANA  -> Sees Devesh[facility] for Rheumatology.  She has been diagnosed with rheumatoid arthritis as well as lupus per patient.  Pertinent ROS were reviewed with the patient and found to be negative unless otherwise specified above in HPI.   Assessment & Plan: Visit Diagnoses:  1. Neck pain   2. Chronic left shoulder pain   3. Trapezius muscle spasm   4. Osteoarthritis of left shoulder, unspecified osteoarthritis type   5. Rheumatoid arthritis involving multiple sites with positive rheumatoid factor (HCC)    Plan: Discussed with Victorino Dike the nature of her left neck and shoulder pain.  I do think she has some rotator cuff arthropathy as her provocative examinations to point to this.  She also has rather significant hypertonicity and spasming of the left trapezius muscle which I feel is some of the radiating pain from her neck.  I have a low suspicion for cervical radiculopathy at this time, although cannot rule out.  Her pain is more radiating but denies any numbness or  tingling or weakness.  Discussed all treatment options, shared decision making elected to proceed with subacromial joint injection for the shoulder.  I would also like to start her on oral diclofenac 50 mg that she may take twice daily to 3 times daily scheduled for the first few weeks.  She is interested in therapy, will send referral to formalized physical therapy to work on soft tissue techniques for the left trapezius, postural changes as well as rotator cuff rehab for her chronic left shoulder pain.  I would like to see her back about 1 month from starting physical therapy for reevaluation.  Additional treatment considerations: Trigger point injection therapy, advanced imaging such as MRI of the cervical spine  She did mention she has had an issue with plantar fascia pain status post surgery, she will make an appointment for this at her leisure  Follow-up: Return in about 1 month (around 01/09/2023).   Meds & Orders:  Meds ordered this encounter  Medications   diclofenac (VOLTAREN) 50 MG EC tablet    Sig: Take 1 tablet (50 mg total) by mouth 3 (three) times daily.    Dispense:  60 tablet    Refill:  1    Orders Placed This Encounter  Procedures   Large Joint Inj: L subacromial bursa   XR Cervical Spine 2 or 3 views   XR Shoulder Left     Procedures: Large Joint Inj: L subacromial bursa on  12/09/2022 10:15 AM Indications: pain Details: 22 G 1.5 in needle, posterior approach Medications: 2 mL lidocaine 1 %; 2 mL bupivacaine 0.25 %; 40 mg methylPREDNISolone acetate 40 MG/ML Outcome: tolerated well, no immediate complications  Subacromial Joint Injection, Left Shoulder After discussion on risks/benefits/indications, informed verbal consent was obtained. A timeout was then performed. Patient was seated on table in exam room. The patient's shoulder was prepped with betadine and alcohol swabs and utilizing posterior approach a 22G, 1.5" needle was directed anteriorly and laterally into the  patient's subacromial space was injected with 2:2:1 mixture of lidocaine:bupivicaine:depomedrol with appreciation of free-flowing of the injectate into the bursal space. Patient tolerated the procedure well without immediate complications.   Procedure, treatment alternatives, risks and benefits explained, specific risks discussed. Consent was given by the patient. Immediately prior to procedure a time out was called to verify the correct patient, procedure, equipment, support staff and site/side marked as required. Patient was prepped and draped in the usual sterile fashion.          Clinical History: No specialty comments available.  She reports that she has never smoked. She has never been exposed to tobacco smoke. She has never used smokeless tobacco.  Recent Labs    05/06/22 1010  LABURIC 5.0    Objective:   Vital Signs: LMP 03/28/2018 (Within Days)   Physical Exam  Gen: Well-appearing, in no acute distress; non-toxic CV: Regular Rate. Well-perfused. Warm.  Resp: Breathing unlabored on room air; no wheezing. Psych: Fluid speech in conversation; appropriate affect; normal thought process Neuro: Sensation intact throughout. No gross coordination deficits.   Ortho Exam - Left shoulder: There is some mild pain at Codman's point as well as significant hypertonicity and pain over the left trapezius muscle belly.  There is full range of motion with active and passive range of motion although some pain with endrange abduction.  There is positive pain with resisted abduction and empty can testing.  Some pain with Gerber liftoff test.  Negative O'Brien's, negative speeds test.  - Cervical: No midline spinous process TTP.  There is full range of motion with flexion/extension, rotation and sidebending.  Rotating and side bending to the left does reproduce some of her pain in her trap.  Negative Spurling's test.  5/5 strength of bilateral upper extremities.  There is significant hypertonicity  and tenderness within the mid belly of the trapezius muscle.  Imaging: XR Cervical Spine 2 or 3 views  Result Date: 12/09/2022 2 views of the cervical spine including AP and lateral film were ordered and reviewed by myself.  There is mild flattening of the cervical lordosis.  Very small spur off the anterior aspect of C6.  Some mild facet arthropathy otherwise no acute bony abnormality or significant DDD.  XR Shoulder Left  Result Date: 12/09/2022 3 views of the left shoulder including Grashey, scapular Y and axial view were ordered and reviewed by myself.  X-rays demonstrate mild to moderate osteoarthritic change of the glenohumeral joint with some narrowing.  There is moderate AC joint arthropathy.  No acute fracture or otherwise bony abnormality noted.   Past Medical/Family/Surgical/Social History: Medications & Allergies reviewed per EMR, new medications updated. Patient Active Problem List   Diagnosis Date Noted   Elevated troponin 11/13/2022   Essential hypertension 11/13/2022   GERD (gastroesophageal reflux disease) 11/13/2022   GAD (generalized anxiety disorder) 11/13/2022   Chest pain 11/12/2022   Plantar fasciitis 12/12/2021   Heel spur, left 12/12/2021   Headache, acute 08/08/2021  Low back pain 05/14/2021   Acquired absence of right breast 09/22/2018   Port-A-Cath in place 05/11/2018   Genetic testing 04/12/2018   Family history of prostate cancer    Family history of colon cancer    Family history of melanoma    Malignant neoplasm of upper-inner quadrant of right breast in female, estrogen receptor positive (HCC) 03/10/2018   Past Medical History:  Diagnosis Date   Abdominal pain, unspecified site    Anxiety state, unspecified    Complication of anesthesia    convulsions - when she has appendectomy about 12 years ago at [facility]   Contact dermatitis and other eczema, due to unspecified cause    Essential hypertension, benign    Family history of colon cancer     Family history of melanoma    Family history of prostate cancer    GERD (gastroesophageal reflux disease)    Hematuria, unspecified    Hypertension    Lower back pain    Mixed hyperlipidemia    Rosacea    rt breast ca dx'd 01/2018   R breast cancer   Unspecified symptom associated with female genital organs    Family History  Problem Relation Age of Onset   Heart failure Mother    Melanoma Mother 54       has had several removed over the past ten years   Atrial fibrillation Mother    Prostate cancer Father 14   Stroke Father    Throat cancer Father    Heart Problems Maternal Aunt        d.60   Heart Problems Maternal Uncle        d.60   Colon cancer Paternal Aunt        dx 51s   Colon cancer Paternal Uncle        dx 88s   Heart attack Maternal Grandmother    Heart attack Maternal Grandfather        possible cancer, unknown type   Stroke Paternal Grandmother    Colon cancer Paternal Grandfather        dx upper 1s   Healthy Daughter    Past Surgical History:  Procedure Laterality Date   BREAST RECONSTRUCTION WITH PLACEMENT OF TISSUE EXPANDER AND ALLODERM Right 09/14/2018   Procedure: BREAST RECONSTRUCTION WITH PLACEMENT OF TISSUE EXPANDER AND ALLODERM;  Surgeon: Glenna Fellows, MD;  Location: [facility];  Service: Plastics;  Laterality: Right;   CESAREAN SECTION     DIAGNOSTIC LAPAROSCOPY     EYE SURGERY     Lasik   LAPAROSCOPIC APPENDECTOMY     LEFT HEART CATH AND CORONARY ANGIOGRAPHY N/A 11/13/2022   Procedure: LEFT HEART CATH AND CORONARY ANGIOGRAPHY;  Surgeon: Kathleene Hazel, MD;  Location: [facility];  Service: Cardiovascular;  Laterality: N/A;   LIPOSUCTION WITH LIPOFILLING Right 07/26/2019   Procedure: LIPOFILLING from abdomen to R chest;  Surgeon: Glenna Fellows, MD;  Location: [facility];  Service: Plastics;  Laterality: Right;   MASTECTOMY WITH RADIOACTIVE SEED GUIDED EXCISION AND AXILLARY SENTINEL LYMPH NODE BIOPSY Right  09/14/2018   Procedure: RIGHT NIPPLE SPARING MASTECTOMY WITH TARGETED DEEP RIGHT AXILLARY LYMPH NODE BIOPSY WITH RADIOACTIVE SEED LOCALIZATION AND RIGHT AXILLARY SENTINEL LYMPH NODE BIOPSY, FROZEN SECTION AND POSSIBLE COMPLETION AXILLARY LYMPH NODE DISSECTION INJECT BLUE DYE RIGHT BREAST;  Surgeon: Claud Kelp, MD;  Location: [facility];  Service: General;  Laterality: Right;   PLANTAR FASCIA SURGERY Left 04/17/2022   PORT-A-CATH REMOVAL Left 07/26/2019  Procedure: REMOVAL PORT-A-CATH;  Surgeon: Glenna Fellows, MD;  Location: [facility];  Service: Plastics;  Laterality: Left;   PORTACATH PLACEMENT Left 04/13/2018   Procedure: INSERTION PORT-A-CATH WITH [place];  Surgeon: Claud Kelp, MD;  Location: [facility];  Service: General;  Laterality: Left;   REMOVAL OF TISSUE EXPANDER AND PLACEMENT OF IMPLANT Right 07/26/2019   Procedure: REMOVAL OF TISSUE EXPANDER AND PLACEMENT OF IMPLANT;  Surgeon: Glenna Fellows, MD;  Location: [facility];  Service: Plastics;  Laterality: Right;   UTERINE FIBROID SURGERY     Social History   Occupational History   Not on file  Tobacco Use   Smoking status: Never    Passive exposure: Never   Smokeless tobacco: Never  Vaping Use   Vaping Use: Never used  Substance and Sexual Activity   Alcohol use: No   Drug use: Never   Sexual activity: Not on file

## 2022-12-09 NOTE — Progress Notes (Signed)
3 to 4 months of pain No injury Radiates down her left arm, especially at night Takes ibuprofen for pain- helps some Does have a history of RA

## 2023-01-09 ENCOUNTER — Ambulatory Visit: Payer: Medicaid Other | Admitting: Sports Medicine

## 2023-01-09 ENCOUNTER — Encounter: Payer: Self-pay | Admitting: Sports Medicine

## 2023-01-09 DIAGNOSIS — M62838 Other muscle spasm: Secondary | ICD-10-CM

## 2023-01-09 DIAGNOSIS — M0579 Rheumatoid arthritis with rheumatoid factor of multiple sites without organ or systems involvement: Secondary | ICD-10-CM

## 2023-01-09 DIAGNOSIS — G8929 Other chronic pain: Secondary | ICD-10-CM

## 2023-01-09 DIAGNOSIS — M79672 Pain in left foot: Secondary | ICD-10-CM | POA: Diagnosis not present

## 2023-01-09 DIAGNOSIS — M25512 Pain in left shoulder: Secondary | ICD-10-CM | POA: Diagnosis not present

## 2023-01-09 NOTE — Progress Notes (Signed)
Left shoulder follow up States the pain is still intermittent, some days worse than others She is doing home exercises; does not have time for formal PT Diclofenac for pain- minimal relief  Injection did help; but feels like it is wearing off

## 2023-01-09 NOTE — Progress Notes (Signed)
Claire Guzman - 57 y.o. female MRN [id number]  Date of birth: 05-Jul-1966  Office Visit Note: Visit Date: 01/09/2023 PCP: Luci Bank, MD Referred by: Luci Bank, MD  Subjective: Chief Complaint  Patient presents with   Left Shoulder - Pain   HPI: Claire Guzman is a pleasant 57 y.o. female who presents today for chronic left shoulder pain.  Had SAJ injection on 12/09/22.  Did receive excellent relief of this, feels like she had 100% pain improvement for the first few weeks.  Still clear is better than it did previously, but her pain is starting to return.  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 but feels like it is now starting to wear off.  She continues her home exercises for the shoulder.  Does feel like the left trapezius and neck continues to be very tight for her.  She is taking diclofenac 50 mg twice daily.  Tolerating this well.  Having some soreness and pain over the dorsum of the left foot and arch, worse after being on her feet for long peers of time.  Does have some mild swelling at the end of the day.  Not as muscular pain or pain with first steps.  Overall this is improving slightly.  Pertinent ROS were reviewed with the patient and found to be negative unless otherwise specified above in HPI.   Assessment & Plan: Visit Diagnoses:  1. Chronic left shoulder pain   2. Trapezius muscle spasm   3. Pain in left foot   4. Rheumatoid arthritis involving multiple sites with positive rheumatoid factor (HCC)    Plan: [name] tells me today she had 100% relief of her pain for the first few weeks after the subacromial joint injection which does point to the pathology being her rotator cuff and shoulder and not the neck or radicular symptoms.  She also has rather significant hypertonicity of the trapezius muscles, left greater than right.  She will continue her home rotator cuff exercises, we will send  her to formalized physical therapy to work on soft tissue techniques such as dry needling, cupping, TENS unit and other modalities as needed to treat her trapezius and neck tightness; also appreciate recs on continuing rotator cuff HEP.  She will continue her diclofenac 50 mg twice daily with food.  Discussed with her over the coming weeks transitioning this to only once weekly and then eventually as needed.  She does not seem to have plantar fascia pain as opposed to more soreness and some swelling at the end of the day with the dorsum of her foot, discussed importance of supportive shoe wear.  We could consider a shoe insert/orthotic with a scaphoid pad to help support the arch given that she had prior surgery to the PT tendon in the past.  She will bring in her tennis shoes at future appointments if she wishes to proceed with this. F/u as needed.  Follow-up: Return if symptoms worsen or fail to improve.   Meds & Orders: No orders of the defined types were placed in this encounter.   Orders Placed This Encounter  Procedures   Ambulatory referral to Physical Therapy     Procedures: No procedures performed      Clinical History: No specialty comments available.  She reports that she has never smoked. She has never been exposed to tobacco smoke. She has never used smokeless tobacco.  Recent Labs  05/06/22 1010  LABURIC 5.0    Objective:   Vital Signs: LMP 03/28/2018 (Within Days)   Physical Exam  Gen: Well-appearing, in no acute distress; non-toxic CV:  Well-perfused. Warm.  Resp: Breathing unlabored on room air; no wheezing. Psych: Fluid speech in conversation; appropriate affect; normal thought process Neuro: Sensation intact throughout. No gross coordination deficits.   Ortho Exam - Left shoulder/trapezius: + No bony TTP or AC joint TTP, mild pain at Codman's point.  Positive drop arm and empty can testing.  Rotator cuff strength is intact and full however.  Full range of  motion.  Negative O'Brien's testing and speeds testing.  There is significant bilateral paraspinal hypertonicity of the trapezius muscles, left greater than right.  Imaging: No results found.  Past Medical/Family/Surgical/Social History: Medications & Allergies reviewed per EMR, new medications updated. Patient Active Problem List   Diagnosis Date Noted   Elevated troponin 11/13/2022   Essential hypertension 11/13/2022   GERD (gastroesophageal reflux disease) 11/13/2022   GAD (generalized anxiety disorder) 11/13/2022   Chest pain 11/12/2022   Plantar fasciitis 12/12/2021   Heel spur, left 12/12/2021   Headache, acute 08/08/2021   Low back pain 05/14/2021   Acquired absence of right breast 09/22/2018   Port-A-Cath in place 05/11/2018   Genetic testing 04/12/2018   Family history of prostate cancer    Family history of colon cancer    Family history of melanoma    Malignant neoplasm of upper-inner quadrant of right breast in female, estrogen receptor positive (HCC) 03/10/2018   Past Medical History:  Diagnosis Date   Abdominal pain, unspecified site    Anxiety state, unspecified    Complication of anesthesia    convulsions - when she has appendectomy about 12 years ago [facility]   Contact dermatitis and other eczema, due to unspecified cause    Essential hypertension, benign    Family history of colon cancer    Family history of melanoma    Family history of prostate cancer    GERD (gastroesophageal reflux disease)    Hematuria, unspecified    Hypertension    Lower back pain    Mixed hyperlipidemia    Rosacea    rt breast ca dx'd 01/2018   R breast cancer   Unspecified symptom associated with female genital organs    Family History  Problem Relation Age of Onset   Heart failure Mother    Melanoma Mother 71       has had several removed over the past ten years   Atrial fibrillation Mother    Prostate cancer Father 63   Stroke Father    Throat cancer Father     Heart Problems Maternal Aunt        d.60   Heart Problems Maternal Uncle        d.60   Colon cancer Paternal Aunt        dx 35s   Colon cancer Paternal Uncle        dx 60s   Heart attack Maternal Grandmother    Heart attack Maternal Grandfather        possible cancer, unknown type   Stroke Paternal Grandmother    Colon cancer Paternal Grandfather        dx upper 7s   Healthy Daughter    Past Surgical History:  Procedure Laterality Date   BREAST RECONSTRUCTION WITH PLACEMENT OF TISSUE EXPANDER AND ALLODERM Right 09/14/2018   Procedure: BREAST RECONSTRUCTION WITH PLACEMENT OF TISSUE EXPANDER AND  ALLODERM;  Surgeon: Glenna Fellows, MD;  Location: [facility];  Service: Plastics;  Laterality: Right;   CESAREAN SECTION     DIAGNOSTIC LAPAROSCOPY     EYE SURGERY     Lasik   LAPAROSCOPIC APPENDECTOMY     LEFT HEART CATH AND CORONARY ANGIOGRAPHY N/A 11/13/2022   Procedure: LEFT HEART CATH AND CORONARY ANGIOGRAPHY;  Surgeon: Kathleene Hazel, MD;  Location: [facility];  Service: Cardiovascular;  Laterality: N/A;   LIPOSUCTION WITH LIPOFILLING Right 07/26/2019   Procedure: LIPOFILLING from abdomen to R chest;  Surgeon: Glenna Fellows, MD;  Location: [facility];  Service: Plastics;  Laterality: Right;   MASTECTOMY WITH RADIOACTIVE SEED GUIDED EXCISION AND AXILLARY SENTINEL LYMPH NODE BIOPSY Right 09/14/2018   Procedure: RIGHT NIPPLE SPARING MASTECTOMY WITH TARGETED DEEP RIGHT AXILLARY LYMPH NODE BIOPSY WITH RADIOACTIVE SEED LOCALIZATION AND RIGHT AXILLARY SENTINEL LYMPH NODE BIOPSY, FROZEN SECTION AND POSSIBLE COMPLETION AXILLARY LYMPH NODE DISSECTION INJECT BLUE DYE RIGHT BREAST;  Surgeon: Claud Kelp, MD;  Location: [facility];  Service: General;  Laterality: Right;   PLANTAR FASCIA SURGERY Left 04/17/2022   PORT-A-CATH REMOVAL Left 07/26/2019   Procedure: REMOVAL PORT-A-CATH;  Surgeon: Glenna Fellows, MD;  Location: [facility];  Service:  Plastics;  Laterality: Left;   PORTACATH PLACEMENT Left 04/13/2018   Procedure: INSERTION PORT-A-CATH WITH [place];  Surgeon: Claud Kelp, MD;  Location: [facility];  Service: General;  Laterality: Left;   REMOVAL OF TISSUE EXPANDER AND PLACEMENT OF IMPLANT Right 07/26/2019   Procedure: REMOVAL OF TISSUE EXPANDER AND PLACEMENT OF IMPLANT;  Surgeon: Glenna Fellows, MD;  Location: [facility];  Service: Plastics;  Laterality: Right;   UTERINE FIBROID SURGERY     Social History   Occupational History   Not on file  Tobacco Use   Smoking status: Never    Passive exposure: Never   Smokeless tobacco: Never  Vaping Use   Vaping Use: Never used  Substance and Sexual Activity   Alcohol use: No   Drug use: Never   Sexual activity: Not on file

## 2023-01-20 NOTE — Progress Notes (Unsigned)
Office Visit Note  Patient: Claire Guzman             Date of Birth: 1965/12/21           MRN: [id number]             PCP: Luci Bank, MD Referring: Darrow Bussing, MD Visit Date: 02/03/2023 Occupation: @GUAROCC @  Subjective:  Increased joint pain   History of Present Illness: Claire Guzman is a 57 y.o. female with history of undifferentiated connective tissue disease.  Patient remains on plaquenil 200 mg 1 tablet by mouth twice daily Monday through Friday.  She is tolerating Plaquenil without any side effects.  She has not missed any doses of Plaquenil recently.  Patient presents today with increased pain involving multiple joints.  She has been having jaw pain for the past 3 weeks as well as ongoing discomfort in her left shoulder as well as new onset pain in her right knee.  She has been having to sleep with her right knee fully extended and has noticed popping throughout the day.  She is also been experiencing pain in the left shoulder as well as limitation with crossover.  Patient continues to have chronic sicca symptoms.  She also had a recent episode of oral ulcers.  She has not had any nasal ulcers.  She denies any symptoms of Raynaud's phenomenon.  She experiences an intermittent rash on her scalp which involves lesions that become very sore and eventually self resolved.  She has been trying to wear sunscreen on a daily basis.  Activities of Daily Living:  Patient reports morning stiffness for 15 minutes.   Patient Reports nocturnal pain.  Difficulty dressing/grooming: Denies Difficulty climbing stairs: Denies Difficulty getting out of chair: Denies Difficulty using hands for taps, buttons, cutlery, and/or writing: Denies  Review of Systems  Constitutional:  Positive for fatigue.  HENT:  Positive for mouth sores and mouth dryness.   Eyes:  Positive for dryness.  Respiratory:  Negative for shortness of breath.   Cardiovascular:  Negative for chest pain  and palpitations.  Gastrointestinal:  Positive for constipation. Negative for blood in stool and diarrhea.  Endocrine: Positive for increased urination.  Genitourinary:  Negative for involuntary urination.  Musculoskeletal:  Positive for joint pain, joint pain, joint swelling, myalgias, morning stiffness, muscle tenderness and myalgias. Negative for gait problem and muscle weakness.  Skin:  Positive for sensitivity to sunlight. Negative for color change, rash and hair loss.  Allergic/Immunologic: Negative for susceptible to infections.  Neurological:  Negative for dizziness and headaches.  Hematological:  Negative for swollen glands.  Psychiatric/Behavioral:  Positive for sleep disturbance. Negative for depressed mood. The patient is nervous/anxious.     PMFS History:  Patient Active Problem List   Diagnosis Date Noted   Elevated troponin 11/13/2022   Essential hypertension 11/13/2022   GERD (gastroesophageal reflux disease) 11/13/2022   GAD (generalized anxiety disorder) 11/13/2022   Chest pain 11/12/2022   Plantar fasciitis 12/12/2021   Heel spur, left 12/12/2021   Headache, acute 08/08/2021   Low back pain 05/14/2021   Acquired absence of right breast 09/22/2018   Port-A-Cath in place 05/11/2018   Genetic testing 04/12/2018   Family history of prostate cancer    Family history of colon cancer    Family history of melanoma    Malignant neoplasm of upper-inner quadrant of right breast in female, estrogen receptor positive (HCC) 03/10/2018    Past Medical History:  Diagnosis Date   Abdominal  pain, unspecified site    Anxiety state, unspecified    Complication of anesthesia    convulsions - when she has appendectomy about 12 years ago at [facility]   Contact dermatitis and other eczema, due to unspecified cause    Essential hypertension, benign    Family history of colon cancer    Family history of melanoma    Family history of prostate cancer    GERD (gastroesophageal  reflux disease)    Hematuria, unspecified    High cholesterol    Hypertension    Lower back pain    Mixed hyperlipidemia    Rosacea    rt breast ca dx'd 01/2018   R breast cancer   Unspecified symptom associated with female genital organs     Family History  Problem Relation Age of Onset   Heart failure Mother    Melanoma Mother 12       has had several removed over the past ten years   Atrial fibrillation Mother    Prostate cancer Father 22   Stroke Father    Throat cancer Father    Heart Problems Maternal Aunt        d.60   Heart Problems Maternal Uncle        d.60   Colon cancer Paternal Aunt        dx 70s   Colon cancer Paternal Uncle        dx 44s   Heart attack Maternal Grandmother    Heart attack Maternal Grandfather        possible cancer, unknown type   Stroke Paternal Grandmother    Colon cancer Paternal Grandfather        dx upper 88s   Healthy Daughter    Past Surgical History:  Procedure Laterality Date   BREAST RECONSTRUCTION WITH PLACEMENT OF TISSUE EXPANDER AND ALLODERM Right 09/14/2018   Procedure: BREAST RECONSTRUCTION WITH PLACEMENT OF TISSUE EXPANDER AND ALLODERM;  Surgeon: Glenna Fellows, MD;  Location: [facility];  Service: Plastics;  Laterality: Right;   CESAREAN SECTION     DIAGNOSTIC LAPAROSCOPY     EYE SURGERY     Lasik   LAPAROSCOPIC APPENDECTOMY     LEFT HEART CATH AND CORONARY ANGIOGRAPHY N/A 11/13/2022   Procedure: LEFT HEART CATH AND CORONARY ANGIOGRAPHY;  Surgeon: Kathleene Hazel, MD;  Location: [facility];  Service: Cardiovascular;  Laterality: N/A;   LIPOSUCTION WITH LIPOFILLING Right 07/26/2019   Procedure: LIPOFILLING from abdomen to R chest;  Surgeon: Glenna Fellows, MD;  Location: [facility];  Service: Plastics;  Laterality: Right;   MASTECTOMY WITH RADIOACTIVE SEED GUIDED EXCISION AND AXILLARY SENTINEL LYMPH NODE BIOPSY Right 09/14/2018   Procedure: RIGHT NIPPLE SPARING MASTECTOMY WITH TARGETED DEEP  RIGHT AXILLARY LYMPH NODE BIOPSY WITH RADIOACTIVE SEED LOCALIZATION AND RIGHT AXILLARY SENTINEL LYMPH NODE BIOPSY, FROZEN SECTION AND POSSIBLE COMPLETION AXILLARY LYMPH NODE DISSECTION INJECT BLUE DYE RIGHT BREAST;  Surgeon: Claud Kelp, MD;  Location: [facility];  Service: General;  Laterality: Right;   PLANTAR FASCIA SURGERY Left 04/17/2022   PORT-A-CATH REMOVAL Left 07/26/2019   Procedure: REMOVAL PORT-A-CATH;  Surgeon: Glenna Fellows, MD;  Location: [facility];  Service: Plastics;  Laterality: Left;   PORTACATH PLACEMENT Left 04/13/2018   Procedure: INSERTION PORT-A-CATH WITH [place];  Surgeon: Claud Kelp, MD;  Location: [facility];  Service: General;  Laterality: Left;   REMOVAL OF TISSUE EXPANDER AND PLACEMENT OF IMPLANT Right 07/26/2019   Procedure: REMOVAL OF  TISSUE EXPANDER AND PLACEMENT OF IMPLANT;  Surgeon: Glenna Fellows, MD;  Location: [facility];  Service: Plastics;  Laterality: Right;   UTERINE FIBROID SURGERY     Social History   Social History Narrative   Not on file   Immunization History  Administered Date(s) Administered   Influenza,inj,Quad PF,6+ Mos 05/11/2018, 05/16/2019, 05/04/2020   Moderna Sars-Covid-2 Vaccination 10/13/2019, 11/10/2019, 08/14/2020   Tdap 02/06/2022     Objective: Vital Signs: BP 132/82 (BP Location: Left Arm, Patient Position: Sitting, Cuff Size: Normal)   Pulse 60   Resp 15   Ht 5\' 4"  (1.626 m)   Wt 166 lb (75.3 kg)   LMP 03/28/2018 (Within Days)   BMI 28.49 kg/m    Physical Exam Vitals and nursing note reviewed.  Constitutional:      Appearance: She is well-developed.  HENT:     Head: Normocephalic and atraumatic.  Eyes:     Conjunctiva/sclera: Conjunctivae normal.  Cardiovascular:     Rate and Rhythm: Normal rate and regular rhythm.     Heart sounds: Normal heart sounds.  Pulmonary:     Effort: Pulmonary effort is normal.     Breath sounds: Normal breath sounds.  Abdominal:      General: Bowel sounds are normal.     Palpations: Abdomen is soft.  Musculoskeletal:     Cervical back: Normal range of motion.  Lymphadenopathy:     Cervical: No cervical adenopathy.  Skin:    General: Skin is warm and dry.     Capillary Refill: Capillary refill takes less than 2 seconds.     Comments: No malar rash  Neurological:     Mental Status: She is alert and oriented to person, place, and time.  Psychiatric:        Behavior: Behavior normal.      Musculoskeletal Exam: C-spine slightly limited ROM. Thoracic spine and lumbar spine have good range of motion with discomfort.  Trapezius muscle tension and tenderness bilaterally.  Tenderness over the TMJ bilaterally.  Shoulder joints have good range of motion with some discomfort in the left shoulder.  Elbow joints, wrist joints, MCPs, PIPs, DIPs have good range of motion with no synovitis.  PIP and DIP thickening noted.  Hip joints have good range of motion with no groin pain.  Painful range of motion of the right knee with crepitus.  Mild warmth noted in the right knee but no effusion noted.  Ankle joints have good range of motion with no tenderness or synovitis.  CDAI Exam: CDAI Score: -- Patient Global: --; Provider Global: -- Swollen: 0 ; Tender: 4  Joint Exam 02/03/2023      Right  Left  TMJ   Tender   Tender  Glenohumeral      Tender  Knee   Tender        Investigation: No additional findings.  Imaging: No results found.  Recent Labs: Lab Results  Component Value Date   WBC 6.6 11/13/2022   HGB 12.6 11/13/2022   PLT 192 11/13/2022   NA 140 11/13/2022   K 3.3 (L) 11/13/2022   CL 105 11/13/2022   CO2 27 11/13/2022   GLUCOSE 104 (H) 11/13/2022   BUN 12 11/13/2022   CREATININE 0.64 11/13/2022   BILITOT 0.6 11/13/2022   ALKPHOS 71 11/13/2022   AST 20 11/13/2022   ALT 16 11/13/2022   PROT 6.6 11/13/2022   ALBUMIN 3.7 11/13/2022   CALCIUM 8.9 11/13/2022   GFRAA >60 03/02/2020  Speciality  Comments: PLQ Eye Exam: 07/30/2022 WNL @ [facility] Follow up 1 year.   Procedures:  No procedures performed Allergies: Trazodone and nefazodone, Metrogel [metronidazole], Other, Paxil [paroxetine hcl], Sulfamethoxazole, Zomig [zolmitriptan], and Prednisone     Assessment / Plan:     Visit Diagnoses: Undifferentiated connective tissue disease (HCC) - Positive ANA, positive double-stranded DNA, dry mouth, polyarthralgia: Patient presents today with increased joint pain involving multiple joints.  She has been experiencing TMJ discomfort bilaterally for the past 3 weeks as well as ongoing discomfort in her left shoulder as well as new onset pain in her right knee joint.  She had a left subacromial bursa cortisone injection performed on 12/09/2022.  She has warmth in the right knee joint on examination today.  She continues to take Plaquenil 200 mg 1 tablet by mouth twice daily Monday through Friday.  She has been tolerating Plaquenil without any side effects.  Lab work was updated on 10/22/2022 at which time ANA has remained positive: 1: 1280 NS, 1: 1280 nuclear, discrete nuclear dots, anticardiolipin antibodies negative, beta-2 glycoprotein antibodies negative, ESR within normal limits, complements within normal limits, no proteinuria, and double-stranded DNA-140--trending up.  Double-stranded DNA has continued to trend up despite initiating Plaquenil in November 2023. Different treatment options were discussed today for better management of her arthralgias, joint stiffness, joint swelling.  Indications, contraindications, and potential side effects of methotrexate were discussed today in detail.  Plan to obtain baseline immunosuppressive lab work today.  A prescription for methotrexate and folic acid will be sent to the pharmacy pending lab results.  She will initiate methotrexate 6 tablets once weekly x 2 weeks and if labs are stable she will increase to 8 tablets once weekly.  She will take  folic acid 2 mg daily.  She will remain on Plaquenil as combination therapy.  She will notify us if she cannot tolerate taking methotrexate.  She will follow-up in the office in 8 weeks to assess her response. - Plan: CBC with Differential/Platelet, COMPLETE METABOLIC PANEL WITH GFR, Protein / creatinine ratio, urine, Anti-DNA antibody, double-stranded, C3 and C4, Sedimentation rate, hydroxychloroquine (PLAQUENIL) 200 MG tablet  Drug Counseling TB Gold: Pending  Hepatitis panel: pending  Chest-xray:  11/12/22  Contraception: Postmenopausal.  Alcohol use: Patient does not drink alcohol.   Patient was counseled on the purpose, proper use, and adverse effects of methotrexate including nausea, infection, and signs and symptoms of pneumonitis.  Reviewed instructions with patient to take methotrexate weekly along with folic acid daily.  Discussed the importance of frequent monitoring of kidney and liver function and blood counts, and provided patient with standing lab instructions.  Counseled patient to avoid NSAIDs and alcohol while on methotrexate.  Provided patient with educational materials on methotrexate and answered all questions.  Advised patient to get annual influenza vaccine and to get a pneumococcal vaccine if patient has not already had one.  Patient voiced understanding.  Patient consented to methotrexate use.  Will upload into chart.    High risk medication use - Plaquenil 200mg  1 tablet by mouth twice daily Monday through Friday.  Plaquenil was initiated in November 2023. Plan to initiate methotrexate 6 tablets by mouth once weekly x 2 weeks and if labs are stable she will increase to 8 tablets once weekly.  She will take folic acid 2 mg daily. PLQ Eye Exam: 07/30/2022 WNL @ [facility] Follow up 1 year.  CBC and CMP updated on 11/13/22.  Orders for CBC and  CMP were released today.  The following baseline immunosuppressive labs were obtained today. - Plan: CBC with  Differential/Platelet, COMPLETE METABOLIC PANEL WITH GFR, QuantiFERON-TB Gold Plus, Serum protein electrophoresis with reflex, IgG, IgA, IgM, Thiopurine methyltransferase(tpmt)rbc, Hepatitis B core antibody, IgM, Hepatitis B surface antigen, Hepatitis C antibody, CBC with Differential/Platelet, COMPLETE METABOLIC PANEL WITH GFR  Primary osteoarthritis of both hands: She has PIP and DIP thickening consistent with osteoarthritis of both hands.  No synovitis was noted on examination today.  Chondromalacia patellae, right knee: Patient presents today with new onset pain in her right knee joint.  No recent injury or fall.  No mechanical symptoms.  She is been experiencing nocturnal pain in her right knee and has to sleep with the knee fully extended.  On examination today warmth in the right knee was noted.  Patient declined a right knee joint cortisone injection at this time.  She will notify us if her symptoms persist or worsen.  Plantar fasciitis - Recent surgery for Planter fasciitis by Dr. Ardelle Anton on April 17, 2022.  Surgical scar is healed.  Arthropathy of cervical facet joint: Slightly limited range of motion with lateral rotation.  Other secondary scoliosis, lumbar region: Chronic pain.  DDD (degenerative disc disease), lumbar: Chronic pain.  Age-related osteoporosis without current pathological fracture: On Zometa infusions by her oncologist. Last infusion September 19, 2021.   Other medical conditions are listed as follows:  Malignant neoplasm of upper-inner quadrant of right breast in female, estrogen receptor positive (HCC)  Acquired absence of right breast  Basal cell carcinoma (BCC) of skin of other part of torso  Family history of melanoma-mother  Family history of colon cancer-paternal grandfather, paternal aunt, paternal uncle  Family history of prostate cancer-father   Orders: Orders Placed This Encounter  Procedures   CBC with Differential/Platelet   COMPLETE  METABOLIC PANEL WITH GFR   Protein / creatinine ratio, urine   Anti-DNA antibody, double-stranded   C3 and C4   Sedimentation rate   QuantiFERON-TB Gold Plus   Serum protein electrophoresis with reflex   IgG, IgA, IgM   Thiopurine methyltransferase(tpmt)rbc   Hepatitis B core antibody, IgM   Hepatitis B surface antigen   Hepatitis C antibody   CBC with Differential/Platelet   COMPLETE METABOLIC PANEL WITH GFR   Meds ordered this encounter  Medications   hydroxychloroquine (PLAQUENIL) 200 MG tablet    Sig: TAKE 1 TABLET TWICE DAILY MONDAY-FRIDAY    Dispense:  120 tablet    Refill:  0    Follow-Up Instructions: Return in about 8 weeks (around 03/31/2023) for UCTD.   Gearldine Bienenstock, PA-C  Note - This record has been created using Dragon software.  Chart creation errors have been sought, but may not always  have been located. Such creation errors do not reflect on  the standard of medical care.

## 2023-01-22 ENCOUNTER — Other Ambulatory Visit: Payer: Self-pay | Admitting: Nurse Practitioner

## 2023-01-22 DIAGNOSIS — C50211 Malignant neoplasm of upper-inner quadrant of right female breast: Secondary | ICD-10-CM

## 2023-01-26 ENCOUNTER — Other Ambulatory Visit: Payer: Self-pay | Admitting: Hematology

## 2023-01-26 ENCOUNTER — Encounter: Payer: Self-pay | Admitting: Cardiovascular Disease

## 2023-01-26 DIAGNOSIS — F419 Anxiety disorder, unspecified: Secondary | ICD-10-CM
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
## 2023-01-27 ENCOUNTER — Ambulatory Visit: Payer: Medicaid Other | Attending: Cardiovascular Disease | Admitting: Cardiovascular Disease

## 2023-01-27 ENCOUNTER — Ambulatory Visit: Payer: Medicaid Other | Admitting: Physician Assistant

## 2023-01-27 ENCOUNTER — Encounter: Payer: Self-pay | Admitting: Cardiovascular Disease

## 2023-01-27 VITALS — BP 118/60 | HR 71 | Ht 64.0 in | Wt 165.8 lb

## 2023-01-27 DIAGNOSIS — I259 Chronic ischemic heart disease, unspecified: Secondary | ICD-10-CM | POA: Diagnosis not present

## 2023-01-27 DIAGNOSIS — I10 Essential (primary) hypertension: Secondary | ICD-10-CM

## 2023-01-27 LAB — LIPID PANEL
Chol/HDL Ratio: 3.4 ratio (ref 0.0–4.4)
Cholesterol, Total: 165 mg/dL (ref 100–199)
HDL: 49 mg/dL (ref >39)
LDL Chol Calc (NIH): 95 mg/dL (ref 0–99)
Triglycerides: 119 mg/dL (ref 0–149)
VLDL Cholesterol Cal: 21 mg/dL (ref 5–40)

## 2023-01-27 MED ORDER — POTASSIUM CHLORIDE ER 10 MEQ PO TBCR: 10.0000 meq | EXTENDED_RELEASE_TABLET | Freq: Two times a day (BID) | ORAL | 3 refills | Status: AC

## 2023-01-27 MED ORDER — NITROGLYCERIN 0.4 MG SL SUBL: 0.4000 mg | SUBLINGUAL_TABLET | SUBLINGUAL | 3 refills | Status: AC | PRN

## 2023-01-27 NOTE — Patient Instructions (Signed)
Medication Instructions:  START Nitroglycerin 0.4 mg sublingual tablets - as needed for chest pain  Change Potassium to 10 meq twice daily  *If you need a refill on your cardiac medications before your next appointment, please call your pharmacy*   Lab Work: Lipid panel TODAY If you have labs (blood work) drawn today and your tests are completely normal, you will receive your results only by: MyChart Message (if you have MyChart) OR A paper copy in the mail If you have any lab test that is abnormal or we need to change your treatment, we will call you to review the results.   Follow-Up: At [facility], you and your health needs are our priority.  As part of our continuing mission to provide you with exceptional heart care, we have created designated Provider Care Teams.  These Care Teams include your primary Cardiologist (physician) and Advanced Practice Providers (APPs -  Physician Assistants and Nurse Practitioners) who all work together to provide you with the care you need, when you need it.  We recommend signing up for the patient portal called "MyChart".  Sign up information is provided on this After Visit Summary.  MyChart is used to connect with patients for Virtual Visits (Telemedicine).  Patients are able to view lab/test results, encounter notes, upcoming appointments, etc.  Non-urgent messages can be sent to your provider as well.   To learn more about what you can do with MyChart, go to [email or web address].    Your next appointment:   6 month(s)  Provider:   Kristeen Miss, MD

## 2023-01-30 ENCOUNTER — Encounter: Payer: Self-pay | Admitting: Hematology

## 2023-02-02 ENCOUNTER — Telehealth: Payer: Self-pay

## 2023-02-02 DIAGNOSIS — Z79899 Other long term (current) drug therapy: Secondary | ICD-10-CM

## 2023-02-02 DIAGNOSIS — E785 Hyperlipidemia, unspecified: Secondary | ICD-10-CM

## 2023-02-02 MED ORDER — EZETIMIBE 10 MG PO TABS
10.0000 mg | ORAL_TABLET | Freq: Every day | ORAL | 3 refills | Status: DC
Start: 2023-02-02 — End: 2023-12-07

## 2023-02-02 NOTE — Telephone Encounter (Signed)
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

## 2023-02-02 NOTE — Telephone Encounter (Signed)
Called and spoke with patient who agrees to plan. Zetia sent to pharmacy on file. Labs entered and scheduled for 05/05/23.

## 2023-02-03 ENCOUNTER — Encounter: Payer: Self-pay | Admitting: Physician Assistant

## 2023-02-03 ENCOUNTER — Ambulatory Visit: Payer: Medicaid Other | Attending: Physician Assistant | Admitting: Physician Assistant

## 2023-02-03 VITALS — BP 132/82 | HR 60 | Resp 15 | Ht 64.0 in | Wt 166.0 lb

## 2023-02-03 DIAGNOSIS — M722 Plantar fascial fibromatosis: Secondary | ICD-10-CM

## 2023-02-03 DIAGNOSIS — C50211 Malignant neoplasm of upper-inner quadrant of right female breast: Secondary | ICD-10-CM

## 2023-02-03 DIAGNOSIS — Z800 Family history of malignant neoplasm of digestive organs: Secondary | ICD-10-CM

## 2023-02-03 DIAGNOSIS — M359 Systemic involvement of connective tissue, unspecified: Secondary | ICD-10-CM

## 2023-02-03 DIAGNOSIS — C44519 Basal cell carcinoma of skin of other part of trunk: Secondary | ICD-10-CM

## 2023-02-03 DIAGNOSIS — M47812 Spondylosis without myelopathy or radiculopathy, cervical region: Secondary | ICD-10-CM

## 2023-02-03 DIAGNOSIS — Z79899 Other long term (current) drug therapy: Secondary | ICD-10-CM

## 2023-02-03 DIAGNOSIS — Z808 Family history of malignant neoplasm of other organs or systems: Secondary | ICD-10-CM

## 2023-02-03 DIAGNOSIS — M810 Age-related osteoporosis without current pathological fracture: Secondary | ICD-10-CM

## 2023-02-03 DIAGNOSIS — Z170 Estrogen receptor positive status [ER+]: Secondary | ICD-10-CM

## 2023-02-03 DIAGNOSIS — Z9011 Acquired absence of right breast and nipple: Secondary | ICD-10-CM

## 2023-02-03 DIAGNOSIS — M19041 Primary osteoarthritis, right hand: Secondary | ICD-10-CM

## 2023-02-03 DIAGNOSIS — M19042 Primary osteoarthritis, left hand: Secondary | ICD-10-CM

## 2023-02-03 DIAGNOSIS — M2241 Chondromalacia patellae, right knee: Secondary | ICD-10-CM | POA: Diagnosis not present

## 2023-02-03 DIAGNOSIS — M4156 Other secondary scoliosis, lumbar region: Secondary | ICD-10-CM

## 2023-02-03 DIAGNOSIS — Z8042 Family history of malignant neoplasm of prostate: Secondary | ICD-10-CM

## 2023-02-03 DIAGNOSIS — M51369 Other intervertebral disc degeneration, lumbar region without mention of lumbar back pain or lower extremity pain: Secondary | ICD-10-CM

## 2023-02-03 DIAGNOSIS — M5136 Other intervertebral disc degeneration, lumbar region: Secondary | ICD-10-CM

## 2023-02-03 MED ORDER — HYDROXYCHLOROQUINE SULFATE 200 MG PO TABS: ORAL_TABLET | ORAL | 0 refills | Status: DC

## 2023-02-03 NOTE — Progress Notes (Signed)
Pharmacy Note  Subjective: Patient presents today to [facility] for follow up office visit. Patient seen by the pharmacist for counseling on methotrexate for  CTD . Prior therapy includes: hydroxychloroquine.  Objective: CBC    Component Value Date/Time   WBC 6.6 11/13/2022 0044   RBC 3.99 11/13/2022 0044   HGB 12.6 11/13/2022 0044   HGB 13.1 08/19/2022 0911   HCT 35.6 (L) 11/13/2022 0044   PLT 192 11/13/2022 0044   PLT 176 08/19/2022 0911   MCV 89.2 11/13/2022 0044   MCH 31.6 11/13/2022 0044   MCHC 35.4 11/13/2022 0044   RDW 12.2 11/13/2022 0044   LYMPHSABS 2.8 11/13/2022 0044   MONOABS 0.4 11/13/2022 0044   EOSABS 0.2 11/13/2022 0044   BASOSABS 0.0 11/13/2022 0044    CMP     Component Value Date/Time   NA 140 11/13/2022 0632   K 3.3 (L) 11/13/2022 0632   CL 105 11/13/2022 0632   CO2 27 11/13/2022 0632   GLUCOSE 104 (H) 11/13/2022 0632   BUN 12 11/13/2022 0632   CREATININE 0.64 11/13/2022 0632   CREATININE 0.58 10/22/2022 0930   CALCIUM 8.9 11/13/2022 0632   PROT 6.6 11/13/2022 0632   ALBUMIN 3.7 11/13/2022 0632   AST 20 11/13/2022 0632   AST 15 08/19/2022 0911   ALT 16 11/13/2022 0632   ALT 10 08/19/2022 0911   ALKPHOS 71 11/13/2022 0632   BILITOT 0.6 11/13/2022 0632   BILITOT 0.5 08/19/2022 0911   GFRNONAA >60 11/13/2022 0632   GFRNONAA >60 08/19/2022 0911   GFRAA >60 03/02/2020 1007    Baseline Immunosuppressant Therapy Labs TB GOLD   Hepatitis Panel   HIV No results found for: "HIV" Immunoglobulins   SPEP    Latest Ref Rng & Units 11/13/2022    6:32 AM  Serum Protein Electrophoresis  Total Protein 6.5 - 8.1 g/dL 6.6    [id number] Lab Results  Component Value Date   G6PDH 4.7 (L) 05/27/2022   TPMT No results found for: "TPMT"   Chest-xray:  11/12/22 - No focal airspace opacity  Contraception: post-menopausal  Alcohol use: none  Assessment/Plan:   Patient was counseled on the purpose, proper use, and adverse effects of  methotrexate including nausea, infection, and signs and symptoms of pneumonitis. Discussed that there is the possibility of an increased risk of malignancy, specifically lymphomas, but it is not well understood if this increased risk is due to the medication or the disease state.  Instructed patient that medication should be held for infection and prior to surgery.  Advised patient to avoid live vaccines. Recommend annual influenza, Pneumovax 23, Prevnar 13, and Shingrix as indicated.   Reviewed instructions with patient to take methotrexate weekly along with folic acid daily.  Discussed the importance of frequent monitoring of kidney and liver function and blood counts, and provided patient with standing lab instructions.  Counseled patient to avoid NSAIDs and alcohol while on methotrexate.  Provided patient with educational materials on methotrexate and answered all questions.   Patient voiced understanding.  Patient consented to methotrexate use.  Will upload into chart.    Dose of methotrexate will be 15mg  po once weekly x 2 weeks with folic acid 2mg  po daily. Repeat labs. If stable, increase to 20mg  po weekly along with folic acid 2mg  daily. Prescription pending baseline labs from today.  She will continue hydroxychloroquine 200mg  twice daily Monday through Friday.  Chesley Mires, PharmD, MPH, BCPS, CPP Clinical Pharmacist (Rheumatology and Pulmonology)

## 2023-02-03 NOTE — Patient Instructions (Signed)
Standing Labs We placed an order today for your standing lab work.   Please have your standing labs drawn in 2 weeks x 2, then every 3 months  Please have your labs drawn 2 weeks prior to your appointment so that the provider can discuss your lab results at your appointment, if possible.  Please note that you may see your imaging and lab results in MyChart before we have reviewed them. We will contact you once all results are reviewed. Please allow our office up to 72 hours to thoroughly review all of the results before contacting the office for clarification of your results.  WALK-IN LAB HOURS  Monday through Thursday from 8:00 am -12:30 pm and 1:00 pm-5:00 pm and Friday from 8:00 am-12:00 pm.  Patients with office visits requiring labs will be seen before walk-in labs.  You may encounter longer than normal wait times. Please allow additional time. Wait times may be shorter on  Monday and Thursday afternoons.  We do not book appointments for walk-in labs. We appreciate your patience and understanding with our staff.   Labs are drawn by [facility]. Please bring your co-pay at the time of your lab draw.  You may receive a bill from [facility] for your lab work.  Please note if you are on Hydroxychloroquine and and an order has been placed for a Hydroxychloroquine level,  you will need to have it drawn 4 hours or more after your last dose.  If you wish to have your labs drawn at another location, please call the office 24 hours in advance so we can fax the orders.  The office is located at [address], [address], [place], [place] [address]   If you have any questions regarding directions or hours of operation,  please call [phone].   As a reminder, please drink plenty of water prior to coming for your lab work. Thanks!   Methotrexate Tablets What is this medication? METHOTREXATE (METH oh TREX ate) treats autoimmune conditions, such as arthritis and psoriasis. It works by decreasing  inflammation, which can reduce pain and prevent long-term injury to the joints and skin. It may also be used to treat some types of cancer. It works by slowing down the growth of cancer cells. This medicine may be used for other purposes; ask your health care provider or pharmacist if you have questions. COMMON BRAND NAME(S): Rheumatrex, Trexall What should I tell my care team before I take this medication? They need to know if you have any of these conditions: Dehydration Diabetes Fluid in the stomach area or lungs Frequently drink alcohol Having surgery, including dental surgery High cholesterol Immune system problems Inflammatory bowel disease, such as ulcerative colitis Kidney disease Liver disease Low blood cell levels (white cells, red cells, and platelets) Lung disease Recent or ongoing radiation Recent or upcoming vaccine Stomach ulcers, other stomach or intestine problems An unusual or allergic reaction to methotrexate, other medications, foods, dyes, or preservatives Pregnant or trying to get pregnant Breastfeeding How should I use this medication? Take this medication by mouth with water. Take it as directed on the prescription label. Do not take extra. Keep taking this medication until your care team tells you to stop. Know why you are taking this medication and how you should take it. To treat conditions such as arthritis and psoriasis, this medication is taken ONCE A WEEK as a single dose or divided into 3 smaller doses taken 12 hours apart (do not take more than 3 doses 12  hours apart each week). This medication is NEVER taken daily to treat conditions other than cancer. Taking this medication more often than directed can cause serious side effects, even death. Talk to your care team about why you are taking this medication, how often you will take it, and what your dose is. Ask your care team to put the reason you take this medication on the prescription. If you take this  medication ONCE A WEEK, choose a day of the week before you start. Ask your pharmacist to include the day of the week on the label. Avoid "Monday", which could be misread as "Morning". Handling this medication may be harmful. Talk to your care team about how to handle this medication. Special instructions may apply. Talk to your care team about the use of this medication in children. While it may be prescribed for selected conditions, precautions do apply. Overdosage: If you think you have taken too much of this medicine contact a poison control center or emergency room at once. NOTE: This medicine is only for you. Do not share this medicine with others. What if I miss a dose? If you miss a dose, talk with your care team. Do not take double or extra doses. What may interact with this medication? Do not take this medication with any of the following: Acitretin Live virus vaccines Probenecid This medication may also interact with the following: Alcohol Aspirin and aspirin-like medications Certain antibiotics, such as penicillin, neomycin, sulfamethoxazole; trimethoprim Certain medications for stomach problems, such as lansoprazole, omeprazole, pantoprazole Clozapine Cyclosporine Dapsone Folic acid [place]et NSAIDs, medications for pain and inflammation, such as ibuprofen or naproxen Phenytoin Pyrimethamine Steroid medications, such as prednisone or cortisone Tacrolimus Theophylline This list may not describe all possible interactions. Give your health care provider a list of all the medicines, herbs, non-prescription drugs, or dietary supplements you use. Also tell them if you smoke, drink alcohol, or use illegal drugs. Some items may interact with your medicine. What should I watch for while using this medication? Visit your care team for regular checks on your progress. It may be some time before you see the benefit from this medication. You may need blood work done while you are  taking this medication. If your care team has also prescribed folic acid, they may instruct you to skip your folic acid dose on the day you take methotrexate. This medication can make you more sensitive to the sun. Keep out of the sun. If you cannot avoid being in the sun, wear protective clothing and sunscreen. Do not use sun lamps, tanning beds, or tanning booths. Check with your care team if you have severe diarrhea, nausea, and vomiting, or if you sweat a lot. The loss of too much body fluid may make it dangerous for you to take this medication. This medication may increase your risk of getting an infection. Call your care team for advice if you get a fever, chills, sore throat, or other symptoms of a cold or flu. Do not treat yourself. Try to avoid being around people who are sick. Talk to your care team about your risk of cancer. You may be more at risk for certain types of cancers if you take this medication. Talk to your care team if you or your partner may be pregnant. Serious birth defects can occur if you take this medication during pregnancy and for 6 months after the last dose. You will need a negative pregnancy test before starting this medication. Contraception is recommended while  taking this medication and for 6 months after the last dose. Your care team can help you find the option that works for you. If your partner can get pregnant, use a condom during sex while taking this medication and for 3 months after the last dose. Do not breastfeed while taking this medication and for 1 week after the last dose. This medication may cause infertility. Talk to your care team if you are concerned about your fertility. What side effects may I notice from receiving this medication? Side effects that you should report to your care team as soon as possible: Allergic reactions--skin rash, itching, hives, swelling of the face, lips, tongue, or throat Dry cough, shortness of breath or trouble  breathing Infection--fever, chills, cough, sore throat, wounds that don't heal, pain or trouble when passing urine, general feeling of discomfort or being unwell Kidney injury--decrease in the amount of urine, swelling of the ankles, hands, or feet Liver injury--right upper belly pain, loss of appetite, nausea, light-colored stool, dark yellow or brown urine, yellowing skin or eyes, unusual weakness or fatigue Low red blood cell level--unusual weakness or fatigue, dizziness, headache, trouble breathing Pain, tingling, or numbness in the hands or feet, muscle weakness, change in vision, confusion or trouble speaking, loss of balance or coordination, trouble walking, seizures Redness, blistering, peeling, or loosening of the skin, including inside the mouth Stomach bleeding--bloody or black, tar-like stools, vomiting blood or brown material that looks like coffee grounds Stomach pain that is severe, does not away, or gets worse Unusual bruising or bleeding Side effects that usually do not require medical attention (report these to your care team if they continue or are bothersome): Diarrhea Dizziness Hair loss Nausea Pain, redness, or swelling with sores inside the mouth or throat Skin reactions on sun-exposed areas Vomiting This list may not describe all possible side effects. Call your doctor for medical advice about side effects. You may report side effects to FDA at 1-800-FDA-1088. Where should I keep my medication? Keep out of the reach of children and pets. Store at room temperature between 20 and 25 degrees C (68 and 77 degrees F). Protect from light. Keep the container tightly closed. Get rid of any unused medication after the expiration date. To get rid of medications that are no longer needed or have expired: Take the medication to a medication take-back program. Check with your pharmacy or law enforcement to find a location. If you cannot return the medication, ask your pharmacist or  care team how to get rid of this medication safely. NOTE: This sheet is a summary. It may not cover all possible information. If you have questions about this medicine, talk to your doctor, pharmacist, or health care provider.  2024 [employer] (2022-09-21 00:00:00)

## 2023-02-06 ENCOUNTER — Other Ambulatory Visit: Payer: Self-pay | Admitting: *Deleted

## 2023-02-06 MED ORDER — FOLIC ACID 1 MG PO TABS: 2.0000 mg | ORAL_TABLET | Freq: Every day | ORAL | 3 refills | Status: AC

## 2023-02-06 MED ORDER — METHOTREXATE SODIUM 2.5 MG PO TABS: ORAL_TABLET | ORAL | 0 refills | Status: DC

## 2023-02-06 NOTE — Telephone Encounter (Signed)
-----   Message from Gearldine Bienenstock sent at 02/06/2023  7:28 AM EDT ----- CBC and CMP WNL  TB gold negative  Hepatitis B and C negative  Immunoglobulins WNL SPEP did not reveal any abnormal protein bands   No proteinuria.  Complements WNL ESR WNL  dsDNA remains positive.    Ok to initiate methotrexate as discussed

## 2023-02-06 NOTE — Progress Notes (Signed)
CBC and CMP WNL  TB gold negative  Hepatitis B and C negative  Immunoglobulins WNL SPEP did not reveal any abnormal protein bands   No proteinuria.  Complements WNL ESR WNL  dsDNA remains positive.    Ok to initiate methotrexate as discussed

## 2023-02-06 NOTE — Telephone Encounter (Signed)
She will initiate methotrexate 6 tablets once weekly x 2 weeks and if labs are stable she will increase to 8 tablets once weekly. She will take folic acid 2 mg daily.

## 2023-02-12 LAB — QUANTIFERON-TB GOLD PLUS
Mitogen-NIL: 8.85 IU/mL
NIL: 0.03 IU/mL
QuantiFERON-TB Gold Plus: NEGATIVE
TB1-NIL: 0 IU/mL
TB2-NIL: 0 IU/mL

## 2023-02-12 LAB — CBC WITH DIFFERENTIAL/PLATELET
Absolute Monocytes: 354 cells/uL (ref 200–950)
Basophils Absolute: 32 cells/uL (ref 0–200)
Basophils Relative: 0.7 %
Eosinophils Absolute: 143 cells/uL (ref 15–500)
Eosinophils Relative: 3.1 %
HCT: 37.1 % (ref 35.0–45.0)
Hemoglobin: 12.8 g/dL (ref 11.7–15.5)
Lymphs Abs: 1633 cells/uL (ref 850–3900)
MCH: 32 pg (ref 27.0–33.0)
MCHC: 34.5 g/dL (ref 32.0–36.0)
MCV: 92.8 fL (ref 80.0–100.0)
MPV: 10.7 fL (ref 7.5–12.5)
Monocytes Relative: 7.7 %
Neutro Abs: 2438 cells/uL (ref 1500–7800)
Neutrophils Relative %: 53 %
Platelets: 205 10*3/uL (ref 140–400)
RBC: 4 10*6/uL (ref 3.80–5.10)
RDW: 12.2 % (ref 11.0–15.0)
Total Lymphocyte: 35.5 %
WBC: 4.6 10*3/uL (ref 3.8–10.8)

## 2023-02-12 LAB — PROTEIN / CREATININE RATIO, URINE
Creatinine, Urine: 30 mg/dL (ref 20–275)
Total Protein, Urine: <4 mg/dL — ABNORMAL LOW (ref 5–24)

## 2023-02-12 LAB — PROTEIN ELECTROPHORESIS, SERUM, WITH REFLEX
Albumin ELP: 4.4 g/dL (ref 3.8–4.8)
Alpha 1: 0.2 g/dL (ref 0.2–0.3)
Alpha 2: 0.6 g/dL (ref 0.5–0.9)
Beta 2: 0.3 g/dL (ref 0.2–0.5)
Beta Globulin: 0.4 g/dL (ref 0.4–0.6)
Gamma Globulin: 1.1 g/dL (ref 0.8–1.7)
Total Protein: 7.1 g/dL (ref 6.1–8.1)

## 2023-02-12 LAB — COMPLETE METABOLIC PANEL WITH GFR
AG Ratio: 2 (calc) (ref 1.0–2.5)
ALT: 11 U/L (ref 6–29)
AST: 18 U/L (ref 10–35)
Albumin: 4.7 g/dL (ref 3.6–5.1)
Alkaline phosphatase (APISO): 85 U/L (ref 37–153)
BUN: 11 mg/dL (ref 7–25)
CO2: 26 mmol/L (ref 20–32)
Calcium: 9.3 mg/dL (ref 8.6–10.4)
Chloride: 104 mmol/L (ref 98–110)
Creat: 0.62 mg/dL (ref 0.50–1.03)
Globulin: 2.4 g/dL (calc) (ref 1.9–3.7)
Glucose, Bld: 90 mg/dL (ref 65–99)
Potassium: 3.7 mmol/L (ref 3.5–5.3)
Sodium: 140 mmol/L (ref 135–146)
Total Bilirubin: 0.4 mg/dL (ref 0.2–1.2)
Total Protein: 7.1 g/dL (ref 6.1–8.1)
eGFR: 105 mL/min/{1.73_m2} (ref >=60)

## 2023-02-12 LAB — HEPATITIS B CORE ANTIBODY, IGM: Hep B C IgM: NONREACTIVE

## 2023-02-12 LAB — ANTI-DNA ANTIBODY, DOUBLE-STRANDED: ds DNA Ab: 122 IU/mL — ABNORMAL HIGH

## 2023-02-12 LAB — SEDIMENTATION RATE: Sed Rate: 14 mm/h (ref 0–30)

## 2023-02-12 LAB — HEPATITIS C ANTIBODY: Hepatitis C Ab: NONREACTIVE

## 2023-02-12 LAB — C3 AND C4
C3 Complement: 156 mg/dL (ref 83–193)
C4 Complement: 20 mg/dL (ref 15–57)

## 2023-02-12 LAB — IGG, IGA, IGM
IgG (Immunoglobin G), Serum: 1104 mg/dL (ref 600–1640)
IgM, Serum: 186 mg/dL (ref 50–300)
Immunoglobulin A: 138 mg/dL (ref 47–310)

## 2023-02-12 LAB — THIOPURINE METHYLTRANSFERASE (TPMT), RBC: Thiopurine Methyltransferase, RBC: 19 nmol/hr/mL RBC

## 2023-02-12 LAB — HEPATITIS B SURFACE ANTIGEN: Hepatitis B Surface Ag: NONREACTIVE

## 2023-02-18 ENCOUNTER — Encounter: Payer: Self-pay | Admitting: Nurse Practitioner

## 2023-02-18 ENCOUNTER — Inpatient Hospital Stay: Payer: Medicaid Other | Admitting: Nurse Practitioner

## 2023-02-18 ENCOUNTER — Other Ambulatory Visit: Payer: Self-pay

## 2023-02-18 ENCOUNTER — Inpatient Hospital Stay: Payer: Medicaid Other | Attending: Nurse Practitioner

## 2023-02-18 ENCOUNTER — Telehealth: Payer: Self-pay | Admitting: *Deleted

## 2023-02-18 VITALS — BP 129/51 | HR 62 | Temp 98.2°F | Resp 17 | Wt 166.5 lb

## 2023-02-18 DIAGNOSIS — Z1231 Encounter for screening mammogram for malignant neoplasm of breast: Secondary | ICD-10-CM

## 2023-02-18 DIAGNOSIS — Z9011 Acquired absence of right breast and nipple: Secondary | ICD-10-CM | POA: Insufficient documentation

## 2023-02-18 DIAGNOSIS — Z923 Personal history of irradiation: Secondary | ICD-10-CM | POA: Diagnosis not present

## 2023-02-18 DIAGNOSIS — C50211 Malignant neoplasm of upper-inner quadrant of right female breast: Secondary | ICD-10-CM

## 2023-02-18 DIAGNOSIS — L949 Localized connective tissue disorder, unspecified: Secondary | ICD-10-CM | POA: Diagnosis not present

## 2023-02-18 DIAGNOSIS — G4700 Insomnia, unspecified: Secondary | ICD-10-CM | POA: Insufficient documentation

## 2023-02-18 DIAGNOSIS — Z79899 Other long term (current) drug therapy: Secondary | ICD-10-CM | POA: Insufficient documentation

## 2023-02-18 DIAGNOSIS — M069 Rheumatoid arthritis, unspecified: Secondary | ICD-10-CM | POA: Diagnosis not present

## 2023-02-18 DIAGNOSIS — M8580 Other specified disorders of bone density and structure, unspecified site: Secondary | ICD-10-CM | POA: Diagnosis not present

## 2023-02-18 DIAGNOSIS — Z853 Personal history of malignant neoplasm of breast: Secondary | ICD-10-CM | POA: Diagnosis not present

## 2023-02-18 DIAGNOSIS — Z170 Estrogen receptor positive status [ER+]: Secondary | ICD-10-CM | POA: Diagnosis not present

## 2023-02-18 DIAGNOSIS — F419 Anxiety disorder, unspecified: Secondary | ICD-10-CM | POA: Insufficient documentation

## 2023-02-18 LAB — CMP (CANCER CENTER ONLY)
ALT: 14 U/L (ref 0–44)
AST: 17 U/L (ref 15–41)
Albumin: 4.5 g/dL (ref 3.5–5.0)
Alkaline Phosphatase: 95 U/L (ref 38–126)
Anion gap: 4 — ABNORMAL LOW (ref 5–15)
BUN: 10 mg/dL (ref 6–20)
CO2: 31 mmol/L (ref 22–32)
Calcium: 9.6 mg/dL (ref 8.9–10.3)
Chloride: 108 mmol/L (ref 98–111)
Creatinine: 0.6 mg/dL (ref 0.44–1.00)
GFR, Estimated: >60 mL/min (ref >60)
Glucose, Bld: 78 mg/dL (ref 70–99)
Potassium: 4.2 mmol/L (ref 3.5–5.1)
Sodium: 143 mmol/L (ref 135–145)
Total Bilirubin: 0.7 mg/dL (ref 0.3–1.2)
Total Protein: 6.8 g/dL (ref 6.5–8.1)

## 2023-02-18 LAB — CBC WITH DIFFERENTIAL (CANCER CENTER ONLY)
Abs Immature Granulocytes: 0.01 10*3/uL (ref 0.00–0.07)
Basophils Absolute: 0 10*3/uL (ref 0.0–0.1)
Basophils Relative: 0 %
Eosinophils Absolute: 0.1 10*3/uL (ref 0.0–0.5)
Eosinophils Relative: 2 %
HCT: 35.2 % — ABNORMAL LOW (ref 36.0–46.0)
Hemoglobin: 12.8 g/dL (ref 12.0–15.0)
Immature Granulocytes: 0 %
Lymphocytes Relative: 25 %
Lymphs Abs: 1.4 10*3/uL (ref 0.7–4.0)
MCH: 32.6 pg (ref 26.0–34.0)
MCHC: 36.4 g/dL — ABNORMAL HIGH (ref 30.0–36.0)
MCV: 89.6 fL (ref 80.0–100.0)
Monocytes Absolute: 0.4 10*3/uL (ref 0.1–1.0)
Monocytes Relative: 7 %
Neutro Abs: 3.7 10*3/uL (ref 1.7–7.7)
Neutrophils Relative %: 66 %
Platelet Count: 189 10*3/uL (ref 150–400)
RBC: 3.93 MIL/uL (ref 3.87–5.11)
RDW: 12 % (ref 11.5–15.5)
WBC Count: 5.6 10*3/uL (ref 4.0–10.5)
nRBC: 0 % (ref 0.0–0.2)

## 2023-02-18 NOTE — Telephone Encounter (Signed)
Please advise patient to see her PCP to rule out infection.

## 2023-02-18 NOTE — Progress Notes (Signed)
Patient Care Team: Luci Bank, MD as PCP - General (Family Medicine) Nahser, Deloris Ping, MD as PCP - Cardiology (Cardiology) Claud Kelp, MD as Consulting Physician (General Surgery) Malachy Mood, MD as Consulting Physician (Hematology) Lonie Peak, MD as Attending Physician (Radiation Oncology) Pershing Proud, RN as Oncology Nurse Navigator Donnelly Angelica, RN as Oncology Nurse Navigator   CHIEF COMPLAINT: Follow up right breast cancer   Oncology History Overview Note  Cancer Staging Malignant neoplasm of upper-inner quadrant of right breast in female, estrogen receptor positive [facility]) Staging form: Breast, AJCC 8th Edition - Clinical stage from 03/08/2018: Stage IB (cT2, cN1, cM0, G3, ER+, PR+, HER2+) - Signed by Malachy Mood, MD on 03/16/2018     Malignant neoplasm of upper-inner quadrant of right breast in female, estrogen receptor positive (HCC)  02/24/2018 Mammogram   screening mammogram on 02/24/2018 that was benign   03/04/2018 Mammogram   diagnostic mammogram on 03/04/2018 due to a palpable mass on her right breast. Diagnostic mammogram, breast US revealed and biopsy revealed suspicious mass in the right breast at 2:30 at the palpable site of concern and one suspicious right axillary LN.   03/08/2018 Cancer Staging   Staging form: Breast, AJCC 8th Edition - Clinical stage from 03/08/2018: Stage IB (cT2, cN1, cM0, G3, ER+, PR+, HER2+) - Signed by Malachy Mood, MD on 03/16/2018   03/08/2018 Receptors her2   Her2 positive, ER 90% PR 5% and Ki67 60%   03/08/2018 Pathology Results   Diagnosis 1. Breast, right, needle core biopsy, 2:30 - INVASIVE DUCTAL CARCINOMA, GRADE 3. - LYMPHOVASCULAR SPACE INVOLVEMENT BY TUMOR. 2. Lymph node, needle/core biopsy, right axilla - METASTATIC CARCINOMA INVOLVING SCANT LYMPHOID TISSUE.   03/10/2018 Initial Diagnosis   Malignant neoplasm of upper-inner quadrant of right breast in female, estrogen receptor positive (HCC)   03/20/2018 Imaging    MRI brain 03/20/18 IMPRESSION: No evidence of metastatic disease.  Normal appearance of brain. Slightly heterogeneous marrow pattern of the upper cervical spine, nonspecific but likely benign, especially in the setting of early stage disease.    03/24/2018 Echocardiogram   Baseline ECHO 03/24/18 Study Conclusions - Left ventricle: The cavity size was normal. Wall thickness was   increased in a pattern of mild LVH. Systolic function was normal.   The estimated ejection fraction was in the range of 60% to 65%.   Wall motion was normal; there were no regional wall motion   abnormalities. Doppler parameters are consistent with abnormal   left ventricular relaxation (grade 1 diastolic dysfunction). - Right ventricle: The cavity size was mildly dilated. Wall   thickness was normal.   03/25/2018 Imaging   MRI Breast B/l 03/25/18 IMPRESSION: 1. The patient's primary malignancy in the posterior right breast measures 3.2 x 2 x 2.6 cm with apparent invasion of the underlying pectoralis muscle. Numerous surrounding abnormal enhancing masses, consistent with satellite lesions, extend from 11 o'clock in the right breast into the inferolateral right breast with a total span of suspected disease measuring 4.5 x 4.6 x 6.4 cm in AP, transverse, and craniocaudal dimension. The suspected extent of disease involves the superior and inferolateral quadrants. The primary malignancy also extends just across midline into the superior medial quadrant. 2. No MRI evidence of malignancy in the left breast. 3. Known metastatic noted in the right axilla.   03/25/2018 Imaging   Whole body bone scan 03/25/18 IMPRESSION: No scintigraphic evidence skeletal metastasis.   03/25/2018 Imaging   CT CAP W Contrast 03/25/18 IMPRESSION: 1.  Mass within the deep aspect of the right breast along the pectoralis muscle compatible with primary breast malignancy. 2. There is suggestion of two additional possible masses within  the lateral aspect of the right breast versus nodular appearing breast tissue. Recommend dedicated evaluation with bilateral breast MRI. 3. Mildly thickened right axillary lymph node compatible with metastatic adenopathy, recently biopsied. 4. Multiple bilateral pulmonary nodules are indeterminate, potentially sequelae of prior infectious/inflammatory process. Recommend attention on follow-up. Consider follow-up CT in 6 months. 5. Portal venous gas is demonstrated within the left hepatic lobe. Additionally, there is wall thickening of the cecum and ascending colon with small amount of gas within the pericolonic vasculature. Constellation of findings is indeterminate in etiology however may be secondary to colitis at this location with considerations including infectious, inflammatory or ischemic etiologies. Correlate for recent procedure. If patient is not up-to-date for colonic screening, recommend further evaluation with colonoscopy to exclude the possibility of colonic mass within the cecum/ascending colon. 6. These results were called by telephone at the time of interpretation on 03/25/2018 at 9:31 am to Dr. Malachy Mood , who verbally acknowledged these results.   03/30/2018 - 04/08/2019 Chemotherapy   Neoadjuvant TCHP every 3 weeks starting 03/30/18. Completed 6 cycles of TCHP on 07/23/18.  Continued with maintenance Herceptin/Perjeta q3weeks to complete 1 year.    04/09/2018 Genetic Testing   Negative genetic testing on the [employer] Common Hereditary Cancers Panel. The Common Hereditary Cancers Panel offered by [employer] includes sequencing and/or deletion duplication testing of the following 47 genes: APC, ATM, AXIN2, BARD1, BMPR1A, BRCA1, BRCA2, BRIP1, CDH1, CDKN2A (p14ARF), CDKN2A (p16INK4a), CKD4, CHEK2, CTNNA1, DICER1, EPCAM (Deletion/duplication testing only), GREM1 (promoter region deletion/duplication testing only), KIT, MEN1, MLH1, MSH2, MSH3, MSH6, MUTYH, NBN, NF1, NHTL1, PALB2, PDGFRA,  PMS2, POLD1, POLE, PTEN, RAD50, RAD51C, RAD51D, SDHB, SDHC, SDHD, SMAD4, SMARCA4. STK11, TP53, TSC1, TSC2, and VHL.  The following genes were evaluated for sequence changes only: SDHA and HOXB13 c.251G>A variant only.   Genetic testing did detect a Variant of Unknown Significance (VUS) in the POLD1 gene called c.985C>T (p.Pro329Ser). At this time, it is unknown if this variant is associated with increased cancer risk or if this is a normal finding, but most variants such as this get reclassified to being inconsequential. It should not be used to make medical management decisions.   The report date is 04/09/2018.   07/24/2018 Breast MRI   IMPRESSION: 1. Resolution of the enhancing mass (known cancer) and small satellite lesions previously seen in the right breast following chemotherapy.   2. The metastatic lymph node in the right axilla has decreased in size.   3.  No MRI evidence of left breast malignancy.   09/14/2018 Surgery   RIGHT NIPPLE SPARING MASTECTOMY WITH TARGETED DEEP RIGHT AXILLARY LYMPH NODE BIOPSY WITH RADIOACTIVE SEED LOCALIZATION AND RIGHT AXILLARY SENTINEL LYMPH NODE BIOPSY by Dr. Derrell Lolling  09/14/18    09/14/2018 Pathology Results   Diagnosis 1. Lymph node, sentinel, biopsy, right axillary with radioactive seed - ONE OF ONE LYMPH NODES NEGATIVE FOR CARCINOMA (0/1). - MILD TREATMENT EFFECT. - BIOPSY SITE. 2. Lymph node, sentinel, biopsy, right axillary - ONE OF ONE LYMPH NODES NEGATIVE FOR CARCINOMA (0/1). 3. Lymph node, sentinel, biopsy, right - ONE OF ONE LYMPH NODES NEGATIVE FOR CARCINOMA (0/1). 4. Lymph node, sentinel, biopsy, right - ONE OF ONE LYMPH NODES NEGATIVE FOR CARCINOMA (0/1). 5. Lymph node, sentinel, biopsy, right - ONE OF ONE LYMPH NODES NEGATIVE FOR CARCINOMA (0/1). 6. Lymph node, sentinel, biopsy,  right - ONE OF ONE LYMPH NODES NEGATIVE FOR CARCINOMA (0/1). 7. Lymph node, sentinel, biopsy, right - ONE OF ONE LYMPH NODES NEGATIVE FOR CARCINOMA  (0/1). 8. Lymph node, sentinel, biopsy, right - ONE OF ONE LYMPH NODES NEGATIVE FOR CARCINOMA (0/1). 9. Lymph node, sentinel, biopsy, right - ONE OF ONE LYMPH NODES NEGATIVE FOR CARCINOMA (0/1). 10. Lymph node, sentinel, biopsy, right - ONE OF ONE LYMPH NODES NEGATIVE FOR CARCINOMA (0/1). 11. Lymph node, sentinel, biopsy, right - ONE OF ONE LYMPH NODES NEGATIVE FOR CARCINOMA (0/1). 12. Lymph node, sentinel, biopsy, right - ONE OF ONE LYMPH NODES NEGATIVE FOR CARCINOMA (0/1). 13. Nipple Biopsy, right - BENIGN NIPPLE TISSUE. 14. Breast, simple mastectomy, right - NO RESIDUAL CARCINOMA IDENTIFIED. - TREATMENT EFFECT. - FIBROCYSTIC CHANGE. - ONE OF ONE LYMPH NODES NEGATIVE FOR CARCINOMA (0/1). - SEE ONCOLOGY TABLE.   09/14/2018 Cancer Staging   Staging form: Breast, AJCC 8th Edition - Pathologic stage from 09/14/2018: No Stage Recommended (ypT0, pN0, cM0, GX, ER: Not Assessed, PR: Not Assessed, HER2: Not Assessed) - Signed by Malachy Mood, MD on 09/23/2018   11/03/2018 - 12/10/2018 Radiation Therapy   Adjuvant Radiation with Dr. Basilio Cairo    12/27/2018 -  Anti-estrogen oral therapy   Start Tamoxifen 20mg  daily in 12/27/2018 stopped after 1 week due to poor toleration. She started anastrozole on 02/04/19 but stopped on 02/14/19 due to worsening muscle aches. She tried Letrozole but did not tolerate. She was switched to Exemestane in late 04/2019.    01/04/2019 Imaging   CT chest  IMPRESSION: 1. Stable tiny subpleural and perifissural nodules bilaterally. No suspicious pulmonary nodule or mass. 2. No focal airspace disease in the right lung. 3.  Aortic Atherosclerois (ICD10-170.0)     03/08/2019 Imaging   CT AP W Contrast 03/08/19  IMPRESSION: 1. No metastatic disease in the abdomen pelvis. 2. No evidence adverse immunotherapy therapy reaction.   03/08/2019 Imaging   Whole body bone scan 03/08/19  IMPRESSION: No definite scintigraphic evidence of osseous metastases.   08/29/2019 - 08/29/2020  Chemotherapy   Started Nerlynx 240mg  daily with budesonide on 08/29/19. Reduced to 200mg  in late 08/2019. Completed on 08/29/20        CURRENT THERAPY:  -Breast cancer surveillance alone, due to AI intolerance  INTERVAL HISTORY Ms. Dermody returns for follow up as scheduled. Last seen by me 08/19/22. L mammo 07/02/22 was negative.  She is struggling to manage lupus/RA, recently started methotrexate last week.  She has some stomach upset a few days after but that is feeling better.  She continues to feel tired. Left shoulder is bothering her. She gets periodic "lumps" on her scalp which can be tender.  Working with rheumatologist.  She denies breast concerns such as new lump/mass, nipple discharge or inversion, or skin change.  ROS  All other systems reviewed and negative  Past Medical History:  Diagnosis Date   Abdominal pain, unspecified site    Anxiety state, unspecified    Complication of anesthesia    convulsions - when she has appendectomy about 12 years ago at [facility]   Contact dermatitis and other eczema, due to unspecified cause    Essential hypertension, benign    Family history of colon cancer    Family history of melanoma    Family history of prostate cancer    GERD (gastroesophageal reflux disease)    Hematuria, unspecified    High cholesterol    Hypertension    Lower back pain    Mixed  hyperlipidemia    Rosacea    rt breast ca dx'd 01/2018   R breast cancer   Unspecified symptom associated with female genital organs      Past Surgical History:  Procedure Laterality Date   BREAST RECONSTRUCTION WITH PLACEMENT OF TISSUE EXPANDER AND ALLODERM Right 09/14/2018   Procedure: BREAST RECONSTRUCTION WITH PLACEMENT OF TISSUE EXPANDER AND ALLODERM;  Surgeon: Glenna Fellows, MD;  Location: [facility] OR;  Service: Plastics;  Laterality: Right;   CESAREAN SECTION     DIAGNOSTIC LAPAROSCOPY     EYE SURGERY     Lasik   LAPAROSCOPIC APPENDECTOMY     LEFT HEART CATH AND CORONARY  ANGIOGRAPHY N/A 11/13/2022   Procedure: LEFT HEART CATH AND CORONARY ANGIOGRAPHY;  Surgeon: Kathleene Hazel, MD;  Location: [facility];  Service: Cardiovascular;  Laterality: N/A;   LIPOSUCTION WITH LIPOFILLING Right 07/26/2019   Procedure: LIPOFILLING from abdomen to R chest;  Surgeon: Glenna Fellows, MD;  Location: [facility];  Service: Plastics;  Laterality: Right;   MASTECTOMY WITH RADIOACTIVE SEED GUIDED EXCISION AND AXILLARY SENTINEL LYMPH NODE BIOPSY Right 09/14/2018   Procedure: RIGHT NIPPLE SPARING MASTECTOMY WITH TARGETED DEEP RIGHT AXILLARY LYMPH NODE BIOPSY WITH RADIOACTIVE SEED LOCALIZATION AND RIGHT AXILLARY SENTINEL LYMPH NODE BIOPSY, FROZEN SECTION AND POSSIBLE COMPLETION AXILLARY LYMPH NODE DISSECTION INJECT BLUE DYE RIGHT BREAST;  Surgeon: Claud Kelp, MD;  Location: [facility] OR;  Service: General;  Laterality: Right;   PLANTAR FASCIA SURGERY Left 04/17/2022   PORT-A-CATH REMOVAL Left 07/26/2019   Procedure: REMOVAL PORT-A-CATH;  Surgeon: Glenna Fellows, MD;  Location: [facility];  Service: Plastics;  Laterality: Left;   PORTACATH PLACEMENT Left 04/13/2018   Procedure: INSERTION PORT-A-CATH WITH [place];  Surgeon: Claud Kelp, MD;  Location: [facility];  Service: General;  Laterality: Left;   REMOVAL OF TISSUE EXPANDER AND PLACEMENT OF IMPLANT Right 07/26/2019   Procedure: REMOVAL OF TISSUE EXPANDER AND PLACEMENT OF IMPLANT;  Surgeon: Glenna Fellows, MD;  Location: [facility];  Service: Plastics;  Laterality: Right;   UTERINE FIBROID SURGERY       Outpatient Encounter Medications as of 02/18/2023  Medication Sig   ALPRAZolam (XANAX) 0.5 MG tablet TAKE 1 TABLET(0.5 MG) BY MOUTH TWICE DAILY AS NEEDED FOR ANXIETY   aspirin EC 81 MG tablet Take 1 tablet (81 mg total) by mouth daily. Swallow whole.   atenolol (TENORMIN) 50 MG tablet Take 50 mg by mouth at bedtime.   atorvastatin (LIPITOR) 40 MG tablet  Take 1 tablet (40 mg total) by mouth daily.   Cholecalciferol (VITAMIN D3) 50 MCG (2000 UT) capsule Take 2,000 Units by mouth daily.   diclofenac (VOLTAREN) 50 MG EC tablet Take 1 tablet (50 mg total) by mouth 3 (three) times daily.   ezetimibe (ZETIA) 10 MG tablet Take 1 tablet (10 mg total) by mouth daily.   folic acid (FOLVITE) 1 MG tablet Take 2 tablets (2 mg total) by mouth daily.   hydroxychloroquine (PLAQUENIL) 200 MG tablet TAKE 1 TABLET TWICE DAILY MONDAY-FRIDAY   ibuprofen (ADVIL) 800 MG tablet Take 1 tablet (800 mg total) by mouth every 8 (eight) hours as needed.   methotrexate (RHEUMATREX) 2.5 MG tablet Take 6 tabs po weekly x 2 weeks. If labs are stable increase to 8 tabs po weekly.  Caution:Chemotherapy. Protect from light.   nitroGLYCERIN (NITROSTAT) 0.4 MG SL tablet Place 1 tablet (0.4 mg total) under the tongue every 5 (five) minutes as needed for chest pain.  pantoprazole (PROTONIX) 40 MG tablet Take 40 mg by mouth daily.   potassium chloride (KLOR-CON) 10 MEQ tablet Take 1 tablet (10 mEq total) by mouth 2 (two) times daily.   valACYclovir (VALTREX) 500 MG tablet Take 500 mg by mouth daily as needed (For outbreaks).   venlafaxine XR (EFFEXOR-XR) 150 MG 24 hr capsule TAKE 1 CAPSULE(150 MG) BY MOUTH DAILY WITH BREAKFAST   [DISCONTINUED] Ascorbic Acid (VITAMIN C) 1000 MG tablet Take 2,000 mg by mouth daily. (Patient not taking: Reported on 11/27/2022)   [DISCONTINUED] omeprazole (PRILOSEC) 20 MG capsule Take 1 capsule (20 mg total) by mouth 2 (two) times daily before a meal. (Patient not taking: Reported on 01/03/2020)   [DISCONTINUED] prochlorperazine (COMPAZINE) 10 MG tablet TAKE 1 TABLET BY MOUTH EVERY 6 HOURS AS NEEDED FOR NAUSEA OR VOMITING   No facility-administered encounter medications on file as of 02/18/2023.     Today's Vitals   02/18/23 1117  BP: (!) 129/51  Pulse: 62  Resp: 17  Temp: 98.2 F (36.8 C)  TempSrc: Oral  SpO2: 99%  Weight: 166 lb 8 oz (75.5 kg)    Body mass index is 28.58 kg/m.   PHYSICAL EXAM GENERAL:alert, no distress and comfortable SKIN: no rash  EYES: sclera clear NECK: without mass LYMPH:  no palpable cervical or supraclavicular lymphadenopathy  LUNGS: normal breathing effort HEART: regular rate & rhythm, no lower extremity edema ABDOMEN: abdomen soft, non-tender and normal bowel sounds NEURO: alert & oriented x 3 with fluent speech, no focal motor/sensory deficits Breast exam: No nipple discharge or inversion.  S/p right mastectomy and reconstruction, incisions completely healed.  Very small subq nodule at the right upper outer implant, unchanged.  No other palpable mass or nodularity in either breast or axilla that I could appreciate.   CBC    Component Value Date/Time   WBC 5.6 02/18/2023 1043   WBC 4.6 02/03/2023 1028   RBC 3.93 02/18/2023 1043   HGB 12.8 02/18/2023 1043   HCT 35.2 (L) 02/18/2023 1043   PLT 189 02/18/2023 1043   MCV 89.6 02/18/2023 1043   MCH 32.6 02/18/2023 1043   MCHC 36.4 (H) 02/18/2023 1043   RDW 12.0 02/18/2023 1043   LYMPHSABS 1.4 02/18/2023 1043   MONOABS 0.4 02/18/2023 1043   EOSABS 0.1 02/18/2023 1043   BASOSABS 0.0 02/18/2023 1043     CMP     Component Value Date/Time   NA 143 02/18/2023 1043   K 4.2 02/18/2023 1043   CL 108 02/18/2023 1043   CO2 31 02/18/2023 1043   GLUCOSE 78 02/18/2023 1043   BUN 10 02/18/2023 1043   CREATININE 0.60 02/18/2023 1043   CREATININE 0.62 02/03/2023 1028   CALCIUM 9.6 02/18/2023 1043   PROT 6.8 02/18/2023 1043   ALBUMIN 4.5 02/18/2023 1043   AST 17 02/18/2023 1043   ALT 14 02/18/2023 1043   ALKPHOS 95 02/18/2023 1043   BILITOT 0.7 02/18/2023 1043   GFRNONAA >60 02/18/2023 1043   GFRAA >60 03/02/2020 1007     ASSESSMENT & PLAN:56 yo post-menpausal female with      1.Malignant neoplasm of upper-inner quadrant of right breast in female, invasive ductal carcinoma, cT2N1M0 stage IB, ER+/PR+/HER2+, G3  -Diagnosed 02/2018, s/p  neoadjuvant TCHP, R mastectomy/ALND, and adjuvant radiation. She completed 1 year maintenance herceptin/perjeta in 03/2019 and Neratinib 08/2019 - 2/022. -she started anti-estrogen therapy 12/2018, did not tolerate tamoxifen, anastrozole, letrozole, or exemestane has been off since 06/24/2021 -She has a small nodule in the upper  outer quadrant reconstructed right breast, ultrasound showed a benign cyst -last mammogram 06/2022 was beign.  -Ms. Gruetzmacher is clinically doing well from a breast cancer standpoint.  Exam is benign, labs are unremarkable.  Overall no clinical concern for recurrence. -She is 5 years from initial diagnosis, the recurrence risk has decreased.  Continue surveillance. -Next mammogram 06/2023 -Follow-up in 1 year, or sooner if needed  2.  Undifferentiated connective tissue disorder/RA/Lupus -on Plaquenil, recently started methotrexate for uncontrolled symptoms  -per rheumatology   3. Anxiety, insomnia -Continue Effexor daily and Xanax as needed -Per PCP   4. Osteoporosis -S/p zometa 02/2020 - 08/2021 -DEXA 09/2021 showed improvement from 2021 except slight decrease in BMD at the LFN -off AI since 2022 -Continue calcium and vitamin D    PLAN: -Labs reviewed -Continue breast cancer surveillance -Mammogram 06/2023 -Follow-up in 1 year, or sooner if needed  Orders Placed This Encounter  Procedures   MM 3D SCREENING MAMMOGRAM UNILATERAL LEFT BREAST    Standing Status:   Future    Standing Expiration Date:   02/18/2024    Order Specific Question:   Reason for Exam (SYMPTOM  OR DIAGNOSIS REQUIRED)    Answer:   h/o R breast cancer 2019 s/p mastectomy    Order Specific Question:   Is the patient pregnant?    Answer:   No    Order Specific Question:   Preferred imaging location?    Answer:   [facility]      All questions were answered. The patient knows to call the clinic with any problems, questions or concerns. No barriers to learning were detected.  Santiago Glad, NP-C 02/18/2023

## 2023-02-18 NOTE — Telephone Encounter (Signed)
Patient advised per Dr. Corliss Skains to see PCP or urgent care to rule out infection. Patient advised to call to follow up after she has had her appointment.

## 2023-02-18 NOTE — Telephone Encounter (Signed)
Patient contacted the office stating she recently started MTX. Patient states she took her first dose on 02/15/2023. Patient states she is having joint pain, fatigue, upset stomach and drainage. Patient states the drainage is very little and just started last night. Patient states she has not seen her PCP to rule out infection. Please advise.

## 2023-02-19 ENCOUNTER — Telehealth: Payer: Self-pay | Admitting: Hematology

## 2023-02-28 ENCOUNTER — Ambulatory Visit: Payer: Medicaid Other | Attending: Sports Medicine

## 2023-03-10 NOTE — Progress Notes (Unsigned)
Office Visit Note  Patient: Claire Guzman             Date of Birth: [birth year]           MRN: [id number]             PCP: Luci Bank, MD Referring: Luci Bank, MD Visit Date: 03/24/2023 Occupation: @GUAROCC @  Subjective:  Medication monitoring-methotrexate   History of Present Illness: Claire Guzman is a 57 y.o. female with history of undifferentiated connective tissue disease.  Patient remains on Plaquenil 200mg  1 tablet by mouth twice daily Monday through Friday.   CBC and CMP updated on 02/18/23  Discussed the importance of holding methotrexate if she develops signs or symptoms of an infection and to resume once the infection has completely cleared.   Activities of Daily Living:  Patient reports morning stiffness for *** {minute/hour:19697}.   Patient {ACTIONS;DENIES/REPORTS:21021675::"Denies"} nocturnal pain.  Difficulty dressing/grooming: {ACTIONS;DENIES/REPORTS:21021675::"Denies"} Difficulty climbing stairs: {ACTIONS;DENIES/REPORTS:21021675::"Denies"} Difficulty getting out of chair: {ACTIONS;DENIES/REPORTS:21021675::"Denies"} Difficulty using hands for taps, buttons, cutlery, and/or writing: {ACTIONS;DENIES/REPORTS:21021675::"Denies"}  No Rheumatology ROS completed.   PMFS History:  Patient Active Problem List   Diagnosis Date Noted   Elevated troponin 11/13/2022   Essential hypertension 11/13/2022   GERD (gastroesophageal reflux disease) 11/13/2022   GAD (generalized anxiety disorder) 11/13/2022   Chest pain 11/12/2022   Plantar fasciitis 12/12/2021   Heel spur, left 12/12/2021   Headache, acute 08/08/2021   Low back pain 05/14/2021   Acquired absence of right breast 09/22/2018   Port-A-Cath in place 05/11/2018   Genetic testing 04/12/2018   Family history of prostate cancer    Family history of colon cancer    Family history of melanoma    Malignant neoplasm of upper-inner quadrant of right breast in female, estrogen receptor  positive (HCC) 03/10/2018    Past Medical History:  Diagnosis Date   Abdominal pain, unspecified site    Anxiety state, unspecified    Complication of anesthesia    convulsions - when she has appendectomy about 12 years ago [facility]   Contact dermatitis and other eczema, due to unspecified cause    Essential hypertension, benign    Family history of colon cancer    Family history of melanoma    Family history of prostate cancer    GERD (gastroesophageal reflux disease)    Hematuria, unspecified    High cholesterol    Hypertension    Lower back pain    Mixed hyperlipidemia    Rosacea    rt breast ca dx'd 01/2018   R breast cancer   Unspecified symptom associated with female genital organs     Family History  Problem Relation Age of Onset   Heart failure Mother    Melanoma Mother 102       has had several removed over the past ten years   Atrial fibrillation Mother    Prostate cancer Father 65   Stroke Father    Throat cancer Father    Heart Problems Maternal Aunt        d.60   Heart Problems Maternal Uncle        d.60   Colon cancer Paternal Aunt        dx 39s   Colon cancer Paternal Uncle        dx 66s   Heart attack Maternal Grandmother    Heart attack Maternal Grandfather        possible cancer, unknown type   Stroke Paternal  Grandmother    Colon cancer Paternal Grandfather        dx upper 4s   Healthy Daughter    Past Surgical History:  Procedure Laterality Date   BREAST RECONSTRUCTION WITH PLACEMENT OF TISSUE EXPANDER AND ALLODERM Right 09/14/2018   Procedure: BREAST RECONSTRUCTION WITH PLACEMENT OF TISSUE EXPANDER AND ALLODERM;  Surgeon: Glenna Fellows, MD;  Location: [facility];  Service: Plastics;  Laterality: Right;   CESAREAN SECTION     DIAGNOSTIC LAPAROSCOPY     EYE SURGERY     Lasik   LAPAROSCOPIC APPENDECTOMY     LEFT HEART CATH AND CORONARY ANGIOGRAPHY N/A 11/13/2022   Procedure: LEFT HEART CATH AND CORONARY ANGIOGRAPHY;  Surgeon: Kathleene Hazel, MD;  [facility]: [facility];  Service: Cardiovascular;  Laterality: N/A;   LIPOSUCTION WITH LIPOFILLING Right 07/26/2019   Procedure: LIPOFILLING from abdomen to R chest;  Surgeon: Glenna Fellows, MD;  Location: [facility];  Service: Plastics;  Laterality: Right;   MASTECTOMY WITH RADIOACTIVE SEED GUIDED EXCISION AND AXILLARY SENTINEL LYMPH NODE BIOPSY Right 09/14/2018   Procedure: RIGHT NIPPLE SPARING MASTECTOMY WITH TARGETED DEEP RIGHT AXILLARY LYMPH NODE BIOPSY WITH RADIOACTIVE SEED LOCALIZATION AND RIGHT AXILLARY SENTINEL LYMPH NODE BIOPSY, FROZEN SECTION AND POSSIBLE COMPLETION AXILLARY LYMPH NODE DISSECTION INJECT BLUE DYE RIGHT BREAST;  Surgeon: Claud Kelp, MD;  Location: [facility];  Service: General;  Laterality: Right;   PLANTAR FASCIA SURGERY Left 04/17/2022   PORT-A-CATH REMOVAL Left 07/26/2019   Procedure: REMOVAL PORT-A-CATH;  Surgeon: Glenna Fellows, MD;  Location: [facility];  Service: Plastics;  Laterality: Left;   PORTACATH PLACEMENT Left 04/13/2018   Procedure: INSERTION PORT-A-CATH WITH [place];  Surgeon: Claud Kelp, MD;  Location: [facility];  Service: General;  Laterality: Left;   REMOVAL OF TISSUE EXPANDER AND PLACEMENT OF IMPLANT Right 07/26/2019   Procedure: REMOVAL OF TISSUE EXPANDER AND PLACEMENT OF IMPLANT;  Surgeon: Glenna Fellows, MD;  Location: [facility];  Service: Plastics;  Laterality: Right;   UTERINE FIBROID SURGERY     Social History   Social History Narrative   Not on file   Immunization History  Administered Date(s) Administered   Influenza,inj,Quad PF,6+ Mos 05/11/2018, 05/16/2019, 05/04/2020   Moderna Sars-Covid-2 Vaccination 10/13/2019, 11/10/2019, 08/14/2020   Tdap 02/06/2022     Objective: Vital Signs: LMP 03/28/2018 (Within Days)    Physical Exam Vitals and nursing note reviewed.  Constitutional:      Appearance: She is well-developed.  HENT:      Head: Normocephalic and atraumatic.  Eyes:     Conjunctiva/sclera: Conjunctivae normal.  Cardiovascular:     Rate and Rhythm: Normal rate and regular rhythm.     Heart sounds: Normal heart sounds.  Pulmonary:     Effort: Pulmonary effort is normal.     Breath sounds: Normal breath sounds.  Abdominal:     General: Bowel sounds are normal.     Palpations: Abdomen is soft.  Musculoskeletal:     Cervical back: Normal range of motion.  Lymphadenopathy:     Cervical: No cervical adenopathy.  Skin:    General: Skin is warm and dry.     Capillary Refill: Capillary refill takes less than 2 seconds.  Neurological:     Mental Status: She is alert and oriented to person, place, and time.  Psychiatric:        Behavior: Behavior normal.      Musculoskeletal Exam: ***  CDAI Exam: CDAI Score: -- Patient  Global: --; Provider Global: -- Swollen: --; Tender: -- Joint Exam 03/24/2023   No joint exam has been documented for this visit   There is currently no information documented on the homunculus. Go to the Rheumatology activity and complete the homunculus joint exam.  Investigation: No additional findings.  Imaging: No results found.  Recent Labs: Lab Results  Component Value Date   WBC 5.6 02/18/2023   HGB 12.8 02/18/2023   PLT 189 02/18/2023   NA 143 02/18/2023   K 4.2 02/18/2023   CL 108 02/18/2023   CO2 31 02/18/2023   GLUCOSE 78 02/18/2023   BUN 10 02/18/2023   CREATININE 0.60 02/18/2023   BILITOT 0.7 02/18/2023   ALKPHOS 95 02/18/2023   AST 17 02/18/2023   ALT 14 02/18/2023   PROT 6.8 02/18/2023   ALBUMIN 4.5 02/18/2023   CALCIUM 9.6 02/18/2023   GFRAA >60 03/02/2020   QFTBGOLDPLUS NEGATIVE 02/03/2023    Speciality Comments: PLQ Eye Exam: 07/30/2022 WNL @ [facility] Follow up 1 year.   Procedures:  No procedures performed Allergies: Trazodone and nefazodone, Metrogel [metronidazole], Other, Paxil [paroxetine hcl], Sulfamethoxazole, Zomig  [zolmitriptan], and Prednisone   Assessment / Plan:     Visit Diagnoses: Undifferentiated connective tissue disease (HCC)  High risk medication use  Primary osteoarthritis of both hands  Chondromalacia patellae, right knee  Plantar fasciitis  Arthropathy of cervical facet joint  Other secondary scoliosis, lumbar region  DDD (degenerative disc disease), lumbar  Age-related osteoporosis without current pathological fracture  Malignant neoplasm of upper-inner quadrant of right breast in female, estrogen receptor positive (HCC)  Acquired absence of right breast  Basal cell carcinoma (BCC) of skin of other part of torso  Family history of melanoma-mother  Family history of colon cancer-paternal grandfather, paternal aunt, paternal uncle  Family history of prostate cancer-father  Chronic left shoulder pain  Orders: No orders of the defined types were placed in this encounter.  No orders of the defined types were placed in this encounter.   Face-to-face time spent with patient was *** minutes. Greater than 50% of time was spent in counseling and coordination of care.  Follow-Up Instructions: No follow-ups on file.   Gearldine Bienenstock, PA-C  Note - This record has been created using Dragon software.  Chart creation errors have been sought, but may not always  have been located. Such creation errors do not reflect on  the standard of medical care.

## 2023-03-24 ENCOUNTER — Ambulatory Visit: Payer: Medicaid Other | Attending: Physician Assistant | Admitting: Physician Assistant

## 2023-03-24 ENCOUNTER — Encounter: Payer: Self-pay | Admitting: Physician Assistant

## 2023-03-24 VITALS — BP 131/83 | HR 58 | Resp 15 | Ht 64.0 in | Wt 167.4 lb

## 2023-03-24 DIAGNOSIS — Z808 Family history of malignant neoplasm of other organs or systems: Secondary | ICD-10-CM

## 2023-03-24 DIAGNOSIS — M5136 Other intervertebral disc degeneration, lumbar region: Secondary | ICD-10-CM

## 2023-03-24 DIAGNOSIS — C50211 Malignant neoplasm of upper-inner quadrant of right female breast: Secondary | ICD-10-CM

## 2023-03-24 DIAGNOSIS — M25512 Pain in left shoulder: Secondary | ICD-10-CM

## 2023-03-24 DIAGNOSIS — Z8042 Family history of malignant neoplasm of prostate: Secondary | ICD-10-CM

## 2023-03-24 DIAGNOSIS — Z79899 Other long term (current) drug therapy: Secondary | ICD-10-CM

## 2023-03-24 DIAGNOSIS — M810 Age-related osteoporosis without current pathological fracture: Secondary | ICD-10-CM

## 2023-03-24 DIAGNOSIS — C44519 Basal cell carcinoma of skin of other part of trunk: Secondary | ICD-10-CM

## 2023-03-24 DIAGNOSIS — M359 Systemic involvement of connective tissue, unspecified: Secondary | ICD-10-CM | POA: Diagnosis not present

## 2023-03-24 DIAGNOSIS — M2241 Chondromalacia patellae, right knee: Secondary | ICD-10-CM | POA: Diagnosis not present

## 2023-03-24 DIAGNOSIS — Z800 Family history of malignant neoplasm of digestive organs: Secondary | ICD-10-CM

## 2023-03-24 DIAGNOSIS — M19042 Primary osteoarthritis, left hand: Secondary | ICD-10-CM

## 2023-03-24 DIAGNOSIS — M19041 Primary osteoarthritis, right hand: Secondary | ICD-10-CM | POA: Diagnosis not present

## 2023-03-24 DIAGNOSIS — M4156 Other secondary scoliosis, lumbar region: Secondary | ICD-10-CM

## 2023-03-24 DIAGNOSIS — Z9011 Acquired absence of right breast and nipple: Secondary | ICD-10-CM

## 2023-03-24 DIAGNOSIS — Z170 Estrogen receptor positive status [ER+]: Secondary | ICD-10-CM

## 2023-03-24 DIAGNOSIS — G8929 Other chronic pain: Secondary | ICD-10-CM

## 2023-03-24 DIAGNOSIS — M47812 Spondylosis without myelopathy or radiculopathy, cervical region: Secondary | ICD-10-CM

## 2023-03-24 DIAGNOSIS — M722 Plantar fascial fibromatosis: Secondary | ICD-10-CM

## 2023-04-13 ENCOUNTER — Ambulatory Visit: Payer: Medicaid Other | Admitting: Rheumatology

## 2023-04-17 ENCOUNTER — Other Ambulatory Visit: Payer: Self-pay | Admitting: Hematology

## 2023-04-17 DIAGNOSIS — F419 Anxiety disorder, unspecified: Secondary | ICD-10-CM

## 2023-04-20 ENCOUNTER — Other Ambulatory Visit: Payer: Self-pay | Admitting: *Deleted

## 2023-04-20 ENCOUNTER — Encounter: Payer: Self-pay | Admitting: Hematology

## 2023-04-20 DIAGNOSIS — M359 Systemic involvement of connective tissue, unspecified: Secondary | ICD-10-CM

## 2023-04-20 MED ORDER — HYDROXYCHLOROQUINE SULFATE 200 MG PO TABS
ORAL_TABLET | ORAL | 0 refills | Status: DC
Start: 2023-04-20 — End: 2023-09-03

## 2023-04-20 NOTE — Telephone Encounter (Signed)
Refill request received via fax from [facility]  for Plaquenil  Last Fill: 02/03/2023  Eye exam: 07/30/2022 WNL   Labs: 02/18/2023 Anion gap 4, Hct 35.2, MCHC 36.4  Next Visit: 06/24/2023  Last Visit: 03/24/2023  DX: Undifferentiated connective tissue disease   Current Dose per office note 03/24/2023: Plaquenil 200 mg 1 tablet by mouth twice daily Monday through Friday.   Okay to refill Plaquenil?

## 2023-04-22 ENCOUNTER — Other Ambulatory Visit: Payer: Self-pay | Admitting: Nurse Practitioner

## 2023-04-22 ENCOUNTER — Other Ambulatory Visit: Payer: Self-pay

## 2023-04-22 DIAGNOSIS — Z170 Estrogen receptor positive status [ER+]: Secondary | ICD-10-CM

## 2023-05-05 ENCOUNTER — Ambulatory Visit: Payer: Medicaid Other | Attending: Nurse Practitioner

## 2023-05-05 DIAGNOSIS — Z79899 Other long term (current) drug therapy: Secondary | ICD-10-CM

## 2023-05-05 DIAGNOSIS — E785 Hyperlipidemia, unspecified: Secondary | ICD-10-CM

## 2023-05-06 LAB — LIPID PANEL
Chol/HDL Ratio: 2.7 {ratio} (ref 0.0–4.4)
Cholesterol, Total: 126 mg/dL (ref 100–199)
HDL: 46 mg/dL (ref >39)
LDL Chol Calc (NIH): 56 mg/dL (ref 0–99)
Triglycerides: 135 mg/dL (ref 0–149)
VLDL Cholesterol Cal: 24 mg/dL (ref 5–40)

## 2023-05-06 LAB — ALT: ALT: 10 [IU]/L (ref 0–32)

## 2023-06-02 ENCOUNTER — Other Ambulatory Visit: Payer: Self-pay | Admitting: Hematology

## 2023-06-02 DIAGNOSIS — F419 Anxiety disorder, unspecified: Secondary | ICD-10-CM

## 2023-06-03 ENCOUNTER — Encounter: Payer: Self-pay | Admitting: Hematology

## 2023-06-10 NOTE — Progress Notes (Unsigned)
Office Visit Note  Patient: Claire Guzman             Date of Birth: Sep 08, 1965           MRN: [id number]             PCP: Luci Bank, MD Referring: Luci Bank, MD Visit Date: 06/24/2023 Occupation: @GUAROCC @  Subjective:  Arthralgias  History of Present Illness: Claire Guzman is a 57 y.o. female with history of undifferentiated connective tissue disease.  Patient remains on  Plaquenil 200 mg 1 tablet by mouth twice daily Monday through Friday.  She is tolerating Plaquenil without any side effects.  She denies any recent gaps in therapy.  She denies any signs or symptoms of a flare and continues to find Plaquenil to be effective at managing her symptoms.  Patient states that she continues to experience intermittent arthralgias primarily in her left shoulder and both knee joints.  Patient states that she was unable to go to physical therapy due to her busy schedule.  Patient states that she has occasional discomfort in her shoulder at night.  She states for the past week she has had swelling in her left ring finger.  She denies any locking or joint tenderness. She has not had any recent rashes or Raynaud's phenomenon.  She has noticed increased mouth dryness recently.  She has not been using any over-the-counter products for symptomatic relief.  She denies any sores in her mouth or nose.   Activities of Daily Living:  Patient reports morning stiffness for 10-15 minutes.   Patient Reports nocturnal pain.  Difficulty dressing/grooming: Denies Difficulty climbing stairs: Denies Difficulty getting out of chair: Denies Difficulty using hands for taps, buttons, cutlery, and/or writing: Denies  Review of Systems  Constitutional:  Positive for fatigue.  HENT:  Positive for mouth dryness. Negative for mouth sores.   Eyes:  Positive for dryness.  Respiratory:  Negative for shortness of breath.   Cardiovascular:  Negative for chest pain and palpitations.   Gastrointestinal:  Positive for constipation. Negative for blood in stool and diarrhea.  Endocrine: Negative for increased urination.  Genitourinary:  Negative for involuntary urination.  Musculoskeletal:  Positive for joint pain, joint pain, joint swelling, muscle weakness and morning stiffness. Negative for gait problem, myalgias, muscle tenderness and myalgias.  Skin:  Negative for color change, rash, hair loss and sensitivity to sunlight.  Allergic/Immunologic: Negative for susceptible to infections.  Neurological:  Negative for dizziness and headaches.  Hematological:  Positive for swollen glands.  Psychiatric/Behavioral:  Positive for sleep disturbance. Negative for depressed mood. The patient is nervous/anxious.     PMFS History:  Patient Active Problem List   Diagnosis Date Noted   Elevated troponin 11/13/2022   Essential hypertension 11/13/2022   GERD (gastroesophageal reflux disease) 11/13/2022   GAD (generalized anxiety disorder) 11/13/2022   Chest pain 11/12/2022   Plantar fasciitis 12/12/2021   Heel spur, left 12/12/2021   Headache, acute 08/08/2021   Low back pain 05/14/2021   Acquired absence of right breast 09/22/2018   Port-A-Cath in place 05/11/2018   Genetic testing 04/12/2018   Family history of prostate cancer    Family history of colon cancer    Family history of melanoma    Malignant neoplasm of upper-inner quadrant of right breast in female, estrogen receptor positive (HCC) 03/10/2018    Past Medical History:  Diagnosis Date   Abdominal pain, unspecified site    Anxiety state, unspecified  Complication of anesthesia    convulsions - when she has appendectomy about 12 years ago at [facility]   Contact dermatitis and other eczema, due to unspecified cause    Essential hypertension, benign    Family history of colon cancer    Family history of melanoma    Family history of prostate cancer    GERD (gastroesophageal reflux disease)    Hematuria,  unspecified    High cholesterol    Hypertension    Lower back pain    Mixed hyperlipidemia    Rosacea    rt breast ca dx'd 01/2018   R breast cancer   Unspecified symptom associated with female genital organs     Family History  Problem Relation Age of Onset   Heart failure Mother    Melanoma Mother 82       has had several removed over the past ten years   Atrial fibrillation Mother    Prostate cancer Father 77   Stroke Father    Throat cancer Father    Heart Problems Maternal Aunt        d.60   Heart Problems Maternal Uncle        d.60   Colon cancer Paternal Aunt        dx 31s   Colon cancer Paternal Uncle        dx 79s   Heart attack Maternal Grandmother    Heart attack Maternal Grandfather        possible cancer, unknown type   Stroke Paternal Grandmother    Colon cancer Paternal Grandfather        dx upper 17s   Healthy Daughter    Past Surgical History:  Procedure Laterality Date   BREAST RECONSTRUCTION WITH PLACEMENT OF TISSUE EXPANDER AND ALLODERM Right 09/14/2018   Procedure: BREAST RECONSTRUCTION WITH PLACEMENT OF TISSUE EXPANDER AND ALLODERM;  Surgeon: Glenna Fellows, MD;  Location: [facility];  Service: Plastics;  Laterality: Right;   CESAREAN SECTION     DIAGNOSTIC LAPAROSCOPY     EYE SURGERY     Lasik   LAPAROSCOPIC APPENDECTOMY     LEFT HEART CATH AND CORONARY ANGIOGRAPHY N/A 11/13/2022   Procedure: LEFT HEART CATH AND CORONARY ANGIOGRAPHY;  Surgeon: Kathleene Hazel, MD;  [facility]: [facility];  Service: Cardiovascular;  Laterality: N/A;   LIPOSUCTION WITH LIPOFILLING Right 07/26/2019   Procedure: LIPOFILLING from abdomen to R chest;  Surgeon: Glenna Fellows, MD;  Location: [facility];  Service: Plastics;  Laterality: Right;   MASTECTOMY WITH RADIOACTIVE SEED GUIDED EXCISION AND AXILLARY SENTINEL LYMPH NODE BIOPSY Right 09/14/2018   Procedure: RIGHT NIPPLE SPARING MASTECTOMY WITH TARGETED DEEP RIGHT AXILLARY LYMPH NODE  BIOPSY WITH RADIOACTIVE SEED LOCALIZATION AND RIGHT AXILLARY SENTINEL LYMPH NODE BIOPSY, FROZEN SECTION AND POSSIBLE COMPLETION AXILLARY LYMPH NODE DISSECTION INJECT BLUE DYE RIGHT BREAST;  Surgeon: Claud Kelp, MD;  Location: [facility];  Service: General;  Laterality: Right;   PLANTAR FASCIA SURGERY Left 04/17/2022   PORT-A-CATH REMOVAL Left 07/26/2019   Procedure: REMOVAL PORT-A-CATH;  Surgeon: Glenna Fellows, MD;  Location: [facility];  Service: Plastics;  Laterality: Left;   PORTACATH PLACEMENT Left 04/13/2018   Procedure: INSERTION PORT-A-CATH WITH [place];  Surgeon: Claud Kelp, MD;  Location: [facility];  Service: General;  Laterality: Left;   REMOVAL OF TISSUE EXPANDER AND PLACEMENT OF IMPLANT Right 07/26/2019   Procedure: REMOVAL OF TISSUE EXPANDER AND PLACEMENT OF IMPLANT;  Surgeon: Glenna Fellows, MD;  Location: [facility];  Service: Plastics;  Laterality: Right;   UTERINE FIBROID SURGERY     Social History   Social History Narrative   Not on file   Immunization History  Administered Date(s) Administered   Influenza,inj,Quad PF,6+ Mos 05/11/2018, 05/16/2019, 05/04/2020   Moderna Sars-Covid-2 Vaccination 10/13/2019, 11/10/2019, 08/14/2020   Tdap 02/06/2022     Objective: Vital Signs: BP 118/80 (BP Location: Left Arm, Patient Position: Sitting, Cuff Size: Normal)   Pulse 73   Resp 15   Ht 5\' 4"  (1.626 m)   Wt 169 lb 6.4 oz (76.8 kg)   LMP 03/28/2018 (Within Days)   BMI 29.08 kg/m    Physical Exam Vitals and nursing note reviewed.  Constitutional:      Appearance: She is well-developed.  HENT:     Head: Normocephalic and atraumatic.  Eyes:     Conjunctiva/sclera: Conjunctivae normal.  Cardiovascular:     Rate and Rhythm: Normal rate and regular rhythm.     Heart sounds: Normal heart sounds.  Pulmonary:     Effort: Pulmonary effort is normal.     Breath sounds: Normal breath sounds.  Abdominal:     General: Bowel  sounds are normal.     Palpations: Abdomen is soft.  Musculoskeletal:     Cervical back: Normal range of motion.  Lymphadenopathy:     Cervical: No cervical adenopathy.  Skin:    General: Skin is warm and dry.     Capillary Refill: Capillary refill takes less than 2 seconds.  Neurological:     Mental Status: She is alert and oriented to person, place, and time.  Psychiatric:        Behavior: Behavior normal.      Musculoskeletal Exam: C-spine, thoracic spine, and lumbar spine have good ROM.  Discomfort with range of motion of the left shoulder.  Right shoulder has full ROM.  Elbow joints, wrist joints, Mcps, PIPs, and DIPs good ROM with no synovitis.  Complete fist formation bilaterally.  Hip joints have good ROM with groin pain.  Knee joints have good range of motion with some discomfort bilaterally.  Bilateral knee crepitus noted.  No warmth or effusion noted.  Ankle joints have good range of motion with no tenderness or joint swelling.  CDAI Exam: CDAI Score: -- Patient Global: --; Provider Global: -- Swollen: --; Tender: -- Joint Exam 06/24/2023   No joint exam has been documented for this visit   There is currently no information documented on the homunculus. Go to the Rheumatology activity and complete the homunculus joint exam.  Investigation: No additional findings.  Imaging: No results found.  Recent Labs: Lab Results  Component Value Date   WBC 5.6 02/18/2023   HGB 12.8 02/18/2023   PLT 189 02/18/2023   NA 143 02/18/2023   K 4.2 02/18/2023   CL 108 02/18/2023   CO2 31 02/18/2023   GLUCOSE 78 02/18/2023   BUN 10 02/18/2023   CREATININE 0.60 02/18/2023   BILITOT 0.7 02/18/2023   ALKPHOS 95 02/18/2023   AST 17 02/18/2023   ALT 10 05/05/2023   PROT 6.8 02/18/2023   ALBUMIN 4.5 02/18/2023   CALCIUM 9.6 02/18/2023   GFRAA >60 03/02/2020   QFTBGOLDPLUS NEGATIVE 02/03/2023    Speciality Comments: PLQ Eye Exam: 07/30/2022 WNL @ [facility] Follow  up 1 year.   Procedures:  No procedures performed Allergies: Trazodone and nefazodone, Metrogel [metronidazole], Other, Paxil [paroxetine hcl], Sulfamethoxazole, Zomig [zolmitriptan], and Prednisone  Assessment / Plan:     Visit Diagnoses: Undifferentiated connective tissue disease (HCC) - Positive ANA, positive double-stranded DNA, dry mouth, polyarthralgia: She has not been experiencing any signs or symptoms of a flare.  Her symptoms have been clinically stable taking Plaquenil 200 mg 1 tablet by mouth twice daily Monday through Friday.  She continues to tolerate Plaquenil without any side effects and has not had any gaps in therapy.  She continues to experience arthralgias primarily in her left shoulder and both knee joints.  No synovitis was noted on examination today.  She has been experiencing increased symptoms of dry mouth but has not been using any over-the-counter products recently.  Discussed the use of over-the-counter products.  Also discussed the importance of seeing her dentist every 6 months.  Her symptoms of dry eyes have been manageable.  She has an upcoming appointment with ophthalmology scheduled in January 2025.  She has not had any oral or nasal ulcerations.  No symptoms of Raynaud's phenomenon.  Patient has an intermittent rash on her scalp which she describes as acne like lesions.  Patient was encouraged to call dermatology to schedule a follow-up visit to confirm the diagnosis of the rash. She will remain on Plaquenil as prescribed.  She is vies notify us if she develops signs or symptoms of a flare.  The following lab work will be obtained today for further evaluation.  She will follow up in 5 months or sooner if needed.   - Plan: Protein / creatinine ratio, urine, CBC with Differential/Platelet, COMPLETE METABOLIC PANEL WITH GFR, C3 and C4, Anti-DNA antibody, double-stranded, Sedimentation rate  High risk medication use - Plaquenil 200 mg 1 tablet by mouth twice daily Monday  through Friday.  Plaquenil was initiated in November 2023. PLQ Eye Exam: 07/30/2022 WNL @ [facility] Follow up 1 year.  She has an upcoming office visit scheduled. CBC and CMP updated on 02/18/23.  Orders for CBC and CMP released today.   - Plan: CBC with Differential/Platelet, COMPLETE METABOLIC PANEL WITH GFR  Primary osteoarthritis of both hands: PIP and DIP thickening consistent with osteoarthritis of both hands.  No synovitis or dactylitis noted.  Chondromalacia patellae, right knee: She continues to experience intermittent discomfort in both knee joints primarily the right knee.  She has mild crepitus noted bilaterally but no warmth or effusion.  Plantar fasciitis -S/p surgical intervention for Plantar fasciitis by Dr. Ardelle Anton on April 17, 2022.  Surgical scar is healed.  Arthropathy of cervical facet joint: She has good range of motion of the C-spine with some discomfort and stiffness intermittently.  No symptoms of radiculopathy at this time.   Degeneration of intervertebral disc of lumbar region without discogenic back pain or lower extremity pain: Chronic pain   Other secondary scoliosis, lumbar region: Chronic pain   Age-related osteoporosis without current pathological fracture: On Zometa infusions by her oncologist.   Other medical conditions are listed as follows:   Malignant neoplasm of upper-inner quadrant of right breast in female, estrogen receptor positive (HCC)  Acquired absence of right breast  Basal cell carcinoma (BCC) of skin of other part of torso  Family history of melanoma-mother  Family history of colon cancer-paternal grandfather, paternal aunt, paternal uncle  Family history of prostate cancer-father  Orders: Orders Placed This Encounter  Procedures   Protein / creatinine ratio, urine   CBC with Differential/Platelet   COMPLETE METABOLIC PANEL WITH GFR   C3 and C4   Anti-DNA antibody, double-stranded   Sedimentation  rate   No  orders of the defined types were placed in this encounter.   Follow-Up Instructions: Return in about 5 months (around 11/22/2023) for UCTD.   Gearldine Bienenstock, PA-C  Note - This record has been created using Dragon software.  Chart creation errors have been sought, but may not always  have been located. Such creation errors do not reflect on  the standard of medical care.

## 2023-06-24 ENCOUNTER — Encounter: Payer: Self-pay | Admitting: Physician Assistant

## 2023-06-24 ENCOUNTER — Ambulatory Visit: Payer: Medicaid Other | Attending: Physician Assistant | Admitting: Physician Assistant

## 2023-06-24 VITALS — BP 118/80 | HR 73 | Resp 15 | Ht 64.0 in | Wt 169.4 lb

## 2023-06-24 DIAGNOSIS — Z800 Family history of malignant neoplasm of digestive organs: Secondary | ICD-10-CM

## 2023-06-24 DIAGNOSIS — C50211 Malignant neoplasm of upper-inner quadrant of right female breast: Secondary | ICD-10-CM

## 2023-06-24 DIAGNOSIS — Z9011 Acquired absence of right breast and nipple: Secondary | ICD-10-CM

## 2023-06-24 DIAGNOSIS — M19042 Primary osteoarthritis, left hand: Secondary | ICD-10-CM

## 2023-06-24 DIAGNOSIS — Z808 Family history of malignant neoplasm of other organs or systems: Secondary | ICD-10-CM

## 2023-06-24 DIAGNOSIS — Z79899 Other long term (current) drug therapy: Secondary | ICD-10-CM | POA: Diagnosis not present

## 2023-06-24 DIAGNOSIS — M51369 Other intervertebral disc degeneration, lumbar region without mention of lumbar back pain or lower extremity pain: Secondary | ICD-10-CM

## 2023-06-24 DIAGNOSIS — M2241 Chondromalacia patellae, right knee: Secondary | ICD-10-CM

## 2023-06-24 DIAGNOSIS — M47812 Spondylosis without myelopathy or radiculopathy, cervical region: Secondary | ICD-10-CM

## 2023-06-24 DIAGNOSIS — Z8042 Family history of malignant neoplasm of prostate: Secondary | ICD-10-CM

## 2023-06-24 DIAGNOSIS — M359 Systemic involvement of connective tissue, unspecified: Secondary | ICD-10-CM

## 2023-06-24 DIAGNOSIS — M722 Plantar fascial fibromatosis: Secondary | ICD-10-CM

## 2023-06-24 DIAGNOSIS — M19041 Primary osteoarthritis, right hand: Secondary | ICD-10-CM | POA: Diagnosis not present

## 2023-06-24 DIAGNOSIS — M4156 Other secondary scoliosis, lumbar region: Secondary | ICD-10-CM

## 2023-06-24 DIAGNOSIS — M810 Age-related osteoporosis without current pathological fracture: Secondary | ICD-10-CM

## 2023-06-24 DIAGNOSIS — Z170 Estrogen receptor positive status [ER+]: Secondary | ICD-10-CM

## 2023-06-24 DIAGNOSIS — C44519 Basal cell carcinoma of skin of other part of trunk: Secondary | ICD-10-CM

## 2023-06-26 LAB — PROTEIN / CREATININE RATIO, URINE
Creatinine, Urine: 37 mg/dL (ref 20–275)
Protein/Creat Ratio: 108 mg/g{creat} (ref 24–184)
Protein/Creatinine Ratio: 0.108 mg/mg{creat} (ref 0.024–0.184)
Total Protein, Urine: 4 mg/dL — ABNORMAL LOW (ref 5–24)

## 2023-06-26 LAB — COMPLETE METABOLIC PANEL WITH GFR
AG Ratio: 2 (calc) (ref 1.0–2.5)
ALT: 10 U/L (ref 6–29)
AST: 17 U/L (ref 10–35)
Albumin: 4.6 g/dL (ref 3.6–5.1)
Alkaline phosphatase (APISO): 87 U/L (ref 37–153)
BUN: 12 mg/dL (ref 7–25)
CO2: 28 mmol/L (ref 20–32)
Calcium: 9.7 mg/dL (ref 8.6–10.4)
Chloride: 103 mmol/L (ref 98–110)
Creat: 0.62 mg/dL (ref 0.50–1.03)
Globulin: 2.3 g/dL (ref 1.9–3.7)
Glucose, Bld: 86 mg/dL (ref 65–99)
Potassium: 3.9 mmol/L (ref 3.5–5.3)
Sodium: 141 mmol/L (ref 135–146)
Total Bilirubin: 0.4 mg/dL (ref 0.2–1.2)
Total Protein: 6.9 g/dL (ref 6.1–8.1)
eGFR: 104 mL/min/{1.73_m2} (ref >=60)

## 2023-06-26 LAB — CBC WITH DIFFERENTIAL/PLATELET
Absolute Lymphocytes: 2240 {cells}/uL (ref 850–3900)
Absolute Monocytes: 350 {cells}/uL (ref 200–950)
Basophils Absolute: 9 {cells}/uL (ref 0–200)
Basophils Relative: 0.2 %
Eosinophils Absolute: 101 {cells}/uL (ref 15–500)
Eosinophils Relative: 2.2 %
HCT: 37.9 % (ref 35.0–45.0)
Hemoglobin: 13.2 g/dL (ref 11.7–15.5)
MCH: 32.3 pg (ref 27.0–33.0)
MCHC: 34.8 g/dL (ref 32.0–36.0)
MCV: 92.7 fL (ref 80.0–100.0)
MPV: 10.9 fL (ref 7.5–12.5)
Monocytes Relative: 7.6 %
Neutro Abs: 1900 {cells}/uL (ref 1500–7800)
Neutrophils Relative %: 41.3 %
Platelets: 204 10*3/uL (ref 140–400)
RBC: 4.09 10*6/uL (ref 3.80–5.10)
RDW: 12.2 % (ref 11.0–15.0)
Total Lymphocyte: 48.7 %
WBC: 4.6 10*3/uL (ref 3.8–10.8)

## 2023-06-26 LAB — C3 AND C4
C3 Complement: 144 mg/dL (ref 83–193)
C4 Complement: 18 mg/dL (ref 15–57)

## 2023-06-26 LAB — ANTI-DNA ANTIBODY, DOUBLE-STRANDED: ds DNA Ab: <1 [IU]/mL

## 2023-06-26 LAB — SEDIMENTATION RATE: Sed Rate: 11 mm/h (ref 0–30)

## 2023-06-26 NOTE — Progress Notes (Signed)
No proteinuria.  CBC and CMP WNL ESR WNL Complements WNL

## 2023-06-28 NOTE — Progress Notes (Signed)
dsDNA is negative.  Labs are not consistent with a flare/active disease.  No change in therapy recommended at this time.

## 2023-07-05 ENCOUNTER — Other Ambulatory Visit: Payer: Self-pay | Admitting: Nurse Practitioner

## 2023-07-05 DIAGNOSIS — F419 Anxiety disorder, unspecified: Secondary | ICD-10-CM

## 2023-07-06 ENCOUNTER — Other Ambulatory Visit: Payer: Self-pay

## 2023-07-06 ENCOUNTER — Encounter: Payer: Self-pay | Admitting: Hematology

## 2023-07-07 ENCOUNTER — Ambulatory Visit: Payer: Medicaid Other

## 2023-07-09 ENCOUNTER — Ambulatory Visit
Admission: RE | Admit: 2023-07-09 | Discharge: 2023-07-09 | Disposition: A | Discharge status: Home / Self Care | Payer: Medicaid Other | Source: Ambulatory Visit | Attending: Nurse Practitioner | Admitting: Nurse Practitioner

## 2023-07-09 DIAGNOSIS — Z1231 Encounter for screening mammogram for malignant neoplasm of breast: Secondary | ICD-10-CM

## 2023-07-09 HISTORY — DX: Personal history of antineoplastic chemotherapy: Z92.21

## 2023-07-09 HISTORY — DX: Personal history of irradiation: Z92.3

## 2023-08-17 ENCOUNTER — Telehealth: Payer: Self-pay | Admitting: Rheumatology

## 2023-08-17 DIAGNOSIS — J329 Chronic sinusitis, unspecified: Secondary | ICD-10-CM

## 2023-08-17 NOTE — Telephone Encounter (Signed)
Referral placed for patient.  

## 2023-08-17 NOTE — Telephone Encounter (Signed)
Ok to place referral to ENT as requested.

## 2023-08-17 NOTE — Telephone Encounter (Signed)
Patient contacted the office requesting a referral to ENT.  Patient request referral for her sinuses. Pt thinks its due to lupus.  Patient's preferred area for referral is: [facility]

## 2023-08-19 ENCOUNTER — Encounter: Payer: Self-pay | Admitting: Nurse Practitioner

## 2023-08-19 ENCOUNTER — Inpatient Hospital Stay: Payer: Medicaid Other | Attending: Hematology

## 2023-08-19 DIAGNOSIS — Z170 Estrogen receptor positive status [ER+]: Secondary | ICD-10-CM

## 2023-08-19 DIAGNOSIS — Z853 Personal history of malignant neoplasm of breast: Secondary | ICD-10-CM | POA: Diagnosis present

## 2023-08-19 LAB — CMP (CANCER CENTER ONLY)
ALT: 13 U/L (ref 0–44)
AST: 21 U/L (ref 15–41)
Albumin: 4.5 g/dL (ref 3.5–5.0)
Alkaline Phosphatase: 83 U/L (ref 38–126)
Anion gap: 5 (ref 5–15)
BUN: 12 mg/dL (ref 6–20)
CO2: 30 mmol/L (ref 22–32)
Calcium: 9.6 mg/dL (ref 8.9–10.3)
Chloride: 105 mmol/L (ref 98–111)
Creatinine: 0.61 mg/dL (ref 0.44–1.00)
GFR, Estimated: >60 mL/min (ref >60)
Glucose, Bld: 92 mg/dL (ref 70–99)
Potassium: 3.6 mmol/L (ref 3.5–5.1)
Sodium: 140 mmol/L (ref 135–145)
Total Bilirubin: 0.5 mg/dL (ref 0.0–1.2)
Total Protein: 7.4 g/dL (ref 6.5–8.1)

## 2023-08-19 LAB — CBC WITH DIFFERENTIAL (CANCER CENTER ONLY)
Abs Immature Granulocytes: 0.01 10*3/uL (ref 0.00–0.07)
Basophils Absolute: 0 10*3/uL (ref 0.0–0.1)
Basophils Relative: 0 %
Eosinophils Absolute: 0.1 10*3/uL (ref 0.0–0.5)
Eosinophils Relative: 2 %
HCT: 36.2 % (ref 36.0–46.0)
Hemoglobin: 13.1 g/dL (ref 12.0–15.0)
Immature Granulocytes: 0 %
Lymphocytes Relative: 32 %
Lymphs Abs: 1.6 10*3/uL (ref 0.7–4.0)
MCH: 31.9 pg (ref 26.0–34.0)
MCHC: 36.2 g/dL — ABNORMAL HIGH (ref 30.0–36.0)
MCV: 88.1 fL (ref 80.0–100.0)
Monocytes Absolute: 0.3 10*3/uL (ref 0.1–1.0)
Monocytes Relative: 7 %
Neutro Abs: 3 10*3/uL (ref 1.7–7.7)
Neutrophils Relative %: 59 %
Platelet Count: 221 10*3/uL (ref 150–400)
RBC: 4.11 MIL/uL (ref 3.87–5.11)
RDW: 12.3 % (ref 11.5–15.5)
WBC Count: 5.1 10*3/uL (ref 4.0–10.5)
nRBC: 0 % (ref 0.0–0.2)

## 2023-09-03 ENCOUNTER — Other Ambulatory Visit: Payer: Self-pay | Admitting: *Deleted

## 2023-09-03 DIAGNOSIS — M359 Systemic involvement of connective tissue, unspecified: Secondary | ICD-10-CM

## 2023-09-03 MED ORDER — HYDROXYCHLOROQUINE SULFATE 200 MG PO TABS: ORAL_TABLET | ORAL | 0 refills | Status: AC

## 2023-09-03 NOTE — Telephone Encounter (Signed)
 Patient contacted the office and request refill on PLQ to be sent to [facility].   Last Fill: 04/20/2023  Eye exam: 07/30/2022 WNL (Patient has her eye exam scheduled for the end of February 2025/early March 2025.)   Labs: 08/19/2023 MCHC 36.2  Next Visit: 12/08/2023  Last Visit: 06/24/2023  IK:Lwipqqzmzwupjuzi connective tissue disease   Current Dose per office note 06/24/2023: Plaquenil  200 mg 1 tablet by mouth twice daily Monday through Friday.   Okay to refill Plaquenil ?

## 2023-11-01 ENCOUNTER — Other Ambulatory Visit: Payer: Self-pay | Admitting: Nurse Practitioner

## 2023-11-01 DIAGNOSIS — F419 Anxiety disorder, unspecified: Secondary | ICD-10-CM

## 2023-11-02 ENCOUNTER — Encounter: Payer: Self-pay | Admitting: Hematology

## 2023-11-09 ENCOUNTER — Encounter: Payer: Self-pay | Admitting: *Deleted

## 2023-11-26 ENCOUNTER — Encounter: Payer: Self-pay | Admitting: Hematology

## 2023-11-26 ENCOUNTER — Telehealth: Payer: Self-pay | Admitting: Hematology

## 2023-11-26 ENCOUNTER — Other Ambulatory Visit: Payer: Self-pay

## 2023-11-26 ENCOUNTER — Telehealth: Payer: Self-pay

## 2023-11-26 ENCOUNTER — Ambulatory Visit (HOSPITAL_COMMUNITY)
Admission: EM | Admit: 2023-11-26 | Discharge: 2023-11-26 | Disposition: A | Discharge status: Home / Self Care | Attending: Family Medicine | Admitting: Family Medicine

## 2023-11-26 ENCOUNTER — Encounter (HOSPITAL_COMMUNITY): Payer: Self-pay

## 2023-11-26 DIAGNOSIS — M62830 Muscle spasm of back: Secondary | ICD-10-CM | POA: Diagnosis not present

## 2023-11-26 MED ORDER — KETOROLAC TROMETHAMINE 60 MG/2ML IM SOLN
60.0000 mg | Freq: Once | INTRAMUSCULAR | Status: AC
  Administered 2023-11-26: 60 mg via INTRAMUSCULAR

## 2023-11-26 MED ORDER — KETOROLAC TROMETHAMINE 30 MG/ML IJ SOLN
INTRAMUSCULAR | Status: AC
  Filled 2023-11-26: qty 2

## 2023-11-26 NOTE — Telephone Encounter (Signed)
 Received telephone call from patient c/o of back pain, R-shoulder pain, and shoulder blade pain ~ same side as masectomy. Patient stated she is supposed to go out of town tomorrow.  Spoke with Abbie Abbey ~ Let patient know that she should schedule office visit or report to ED.  Patient verbalized understanding ~ Transferred patient call to scheduler.

## 2023-11-26 NOTE — ED Provider Notes (Signed)
 [facility]    CSN: [id number] Arrival date & time: 11/26/23  4098      History   Chief Complaint Chief Complaint  Patient presents with   Shoulder Pain    HPI Claire Guzman is a 58 y.o. female.   The patient reports waking up with pain medial to her right shoulder blade.  She has been doing a lot of repetitive lifting recently, is very stressed, and has been having to work on phones more.  The pain is worsened with flexion of the neck and has not responded well to heat or Tylenol . She does have intermittent spasms but this has lasted longer than usual.  She denies any chest pain, trouble breathing, numbness or weakness.  She does have a history of mastectomy from breast cancer which gives her concern for any muscular symptoms  The history is provided by the patient.  Shoulder Pain Associated symptoms: back pain   Associated symptoms: no fever     Past Medical History:  Diagnosis Date   Abdominal pain, unspecified site    Anxiety state, unspecified    Complication of anesthesia    convulsions - when she has appendectomy about 12 years ago at [facility]   Contact dermatitis and other eczema, due to unspecified cause    Essential hypertension, benign    Family history of colon cancer    Family history of melanoma    Family history of prostate cancer    GERD (gastroesophageal reflux disease)    Hematuria, unspecified    High cholesterol    Hypertension    Lower back pain    Mixed hyperlipidemia    Personal history of chemotherapy    Personal history of radiation therapy    Rosacea    rt breast ca dx'd 01/2018   R breast cancer   Unspecified symptom associated with female genital organs     Patient Active Problem List   Diagnosis Date Noted   Elevated troponin 11/13/2022   Essential hypertension 11/13/2022   GERD (gastroesophageal reflux disease) 11/13/2022   GAD (generalized anxiety disorder) 11/13/2022   Chest pain 11/12/2022   Plantar  fasciitis 12/12/2021   Heel spur, left 12/12/2021   Headache, acute 08/08/2021   Low back pain 05/14/2021   Acquired absence of right breast 09/22/2018   Port-A-Cath in place 05/11/2018   Genetic testing 04/12/2018   Family history of prostate cancer    Family history of colon cancer    Family history of melanoma    Malignant neoplasm of upper-inner quadrant of right breast in female, estrogen receptor positive (HCC) 03/10/2018    Past Surgical History:  Procedure Laterality Date   BREAST RECONSTRUCTION WITH PLACEMENT OF TISSUE EXPANDER AND ALLODERM Right 09/14/2018   Procedure: BREAST RECONSTRUCTION WITH PLACEMENT OF TISSUE EXPANDER AND ALLODERM;  Surgeon: Alger Infield, MD;  Location: [facility];  Service: Plastics;  Laterality: Right;   CESAREAN SECTION     DIAGNOSTIC LAPAROSCOPY     EYE SURGERY     Lasik   LAPAROSCOPIC APPENDECTOMY     LEFT HEART CATH AND CORONARY ANGIOGRAPHY N/A 11/13/2022   Procedure: LEFT HEART CATH AND CORONARY ANGIOGRAPHY;  Surgeon: Odie Benne, MD;  Location: [facility];  Service: Cardiovascular;  Laterality: N/A;   LIPOSUCTION WITH LIPOFILLING Right 07/26/2019   Procedure: LIPOFILLING from abdomen to R chest;  Surgeon: Alger Infield, MD;  Location: [facility];  Service: Plastics;  Laterality: Right;   MASTECTOMY Right  2019   MASTECTOMY WITH RADIOACTIVE SEED GUIDED EXCISION AND AXILLARY SENTINEL LYMPH NODE BIOPSY Right 09/14/2018   Procedure: RIGHT NIPPLE SPARING MASTECTOMY WITH TARGETED DEEP RIGHT AXILLARY LYMPH NODE BIOPSY WITH RADIOACTIVE SEED LOCALIZATION AND RIGHT AXILLARY SENTINEL LYMPH NODE BIOPSY, FROZEN SECTION AND POSSIBLE COMPLETION AXILLARY LYMPH NODE DISSECTION INJECT BLUE DYE RIGHT BREAST;  Surgeon: Boyce Byes, MD;  Location: [facility];  Service: General;  Laterality: Right;   PLANTAR FASCIA SURGERY Left 04/17/2022   PORT-A-CATH REMOVAL Left 07/26/2019   Procedure: REMOVAL PORT-A-CATH;  Surgeon:  Alger Infield, MD;  Location: [facility];  Service: Plastics;  Laterality: Left;   PORTACATH PLACEMENT Left 04/13/2018   Procedure: INSERTION PORT-A-CATH WITH US ;  Surgeon: Boyce Byes, MD;  Location: [facility];  Service: General;  Laterality: Left;   REMOVAL OF TISSUE EXPANDER AND PLACEMENT OF IMPLANT Right 07/26/2019   Procedure: REMOVAL OF TISSUE EXPANDER AND PLACEMENT OF IMPLANT;  Surgeon: Alger Infield, MD;  Location: [facility];  Service: Plastics;  Laterality: Right;   UTERINE FIBROID SURGERY      OB History   No obstetric history on file.      Home Medications    Prior to Admission medications   Medication Sig Start Date End Date Taking? Authorizing Provider  busPIRone (BUSPAR) 5 MG tablet Take 1 tablet by mouth 2 (two) times daily. 11/09/23  Yes [provider]  ALPRAZolam  (XANAX ) 0.5 MG tablet TAKE 1 TABLET(0.5 MG) BY MOUTH TWICE DAILY AS NEEDED FOR ANXIETY 11/02/23   Sharyon Deis, NP  aspirin  EC 81 MG tablet Take 1 tablet (81 mg total) by mouth daily. Swallow whole. Patient taking differently: Take 81 mg by mouth as needed. Swallow whole. 11/14/22   Pokhrel, Amador Bad, MD  atenolol  (TENORMIN ) 50 MG tablet Take 50 mg by mouth at bedtime.    [provider]  atorvastatin  (LIPITOR ) 40 MG tablet Take 1 tablet (40 mg total) by mouth daily. Patient not taking: Reported on 06/24/2023 11/27/22   Swinyer, Leilani Punter, NP  ezetimibe  (ZETIA ) 10 MG tablet Take 1 tablet (10 mg total) by mouth daily. Patient not taking: Reported on 06/24/2023 02/02/23 06/24/23  Nahser, Lela Purple, MD  folic acid  (FOLVITE ) 1 MG tablet Take 2 tablets (2 mg total) by mouth daily. Patient not taking: Reported on 06/24/2023 02/06/23   Romayne Clubs, PA-C  hydroxychloroquine  (PLAQUENIL ) 200 MG tablet TAKE 1 TABLET TWICE DAILY MONDAY-FRIDAY 09/03/23   Romayne Clubs, PA-C  nitroGLYCERIN  (NITROSTAT ) 0.4 MG SL tablet Place 1 tablet (0.4 mg total)  under the tongue every 5 (five) minutes as needed for chest pain. 01/27/23 06/24/23  Nahser, Lela Purple, MD  pantoprazole  (PROTONIX ) 40 MG tablet Take 40 mg by mouth daily. 09/03/22   [provider]  potassium chloride  (KLOR-CON ) 10 MEQ tablet Take 1 tablet (10 mEq total) by mouth 2 (two) times daily. 01/27/23 06/24/23  Nahser, Lela Purple, MD  valACYclovir  (VALTREX ) 500 MG tablet Take 500 mg by mouth daily as needed (For outbreaks).    [provider]  venlafaxine  XR (EFFEXOR -XR) 150 MG 24 hr capsule TAKE 1 CAPSULE(150 MG) BY MOUTH DAILY WITH BREAKFAST 02/24/22   Sonja Jamul, MD  omeprazole  (PRILOSEC) 20 MG capsule Take 1 capsule (20 mg total) by mouth 2 (two) times daily before a meal. Patient not taking: Reported on 01/03/2020 05/03/18 10/30/20  Sonja [place], MD  prochlorperazine  (COMPAZINE ) 10 MG tablet TAKE 1 TABLET BY MOUTH EVERY 6 HOURS AS NEEDED FOR NAUSEA  OR VOMITING 08/10/18 08/12/18  Sonja [place], MD    Family History Family History  Problem Relation Age of Onset   Heart failure Mother    Melanoma Mother 52       has had several removed over the past ten years   Atrial fibrillation Mother    Prostate cancer Father 21   Stroke Father    Throat cancer Father    Healthy Daughter    Heart Problems Maternal Aunt        d.60   Heart Problems Maternal Uncle        d.60   Colon cancer Paternal Aunt        dx 51s   Colon cancer Paternal Uncle        dx 34s   Heart attack Maternal Grandmother    Heart attack Maternal Grandfather        possible cancer, unknown type   Stroke Paternal Grandmother    Colon cancer Paternal Grandfather        dx upper 5s   Breast cancer Neg Hx    BRCA 1/2 Neg Hx     Social History Social History   Tobacco Use   Smoking status: Never    Passive exposure: Never   Smokeless tobacco: Never  Vaping Use   Vaping status: Never Used  Substance Use Topics   Alcohol use: No   Drug use: Never     Allergies   Trazodone  and nefazodone, Metrogel  [metronidazole], Other, Paxil [paroxetine hcl], Sulfamethoxazole , Zomig [zolmitriptan], and Prednisone    Review of Systems Review of Systems  Constitutional:  Positive for activity change. Negative for appetite change, chills, fever and unexpected weight change.  Respiratory:  Negative for shortness of breath.   Cardiovascular:  Negative for chest pain and palpitations.  Musculoskeletal:  Positive for back pain and neck stiffness. Negative for joint swelling.  Skin:  Negative for color change and rash.  Neurological:  Negative for weakness and numbness.     Physical Exam Triage Vital Signs ED Triage Vitals  Encounter Vitals Group     BP 11/26/23 1132 (!) 145/85     Systolic BP Percentile --      Diastolic BP Percentile --      Pulse Rate 11/26/23 1132 70     Resp 11/26/23 1132 16     Temp 11/26/23 1132 99 F (37.2 C)     Temp Source 11/26/23 1132 Oral     SpO2 11/26/23 1132 96 %     Weight 11/26/23 1133 165 lb (74.8 kg)     Height 11/26/23 1133 5\' 4"  (1.626 m)     Head Circumference --      Peak Flow --      Pain Score 11/26/23 1133 6     Pain Loc --      Pain Education --      Exclude from Growth Chart --    No data found.  Updated Vital Signs BP (!) 145/85 (BP Location: Left Arm)   Pulse 70   Temp 99 F (37.2 C) (Oral)   Resp 16   Ht 5\' 4"  (1.626 m)   Wt 74.8 kg   LMP 03/28/2018 (Within Days)   SpO2 96%   BMI 28.32 kg/m   Visual Acuity Right Eye Distance:   Left Eye Distance:   Bilateral Distance:    Right Eye Near:   Left Eye Near:    Bilateral Near:     Physical Exam Vitals reviewed.  Constitutional:      General: She is not in acute distress.    Appearance: Normal appearance. She is normal weight. She is not ill-appearing, toxic-appearing or diaphoretic.  HENT:     Head: Normocephalic and atraumatic.  Eyes:     Extraocular Movements: Extraocular movements intact.     Pupils: Pupils are equal, round, and reactive to light.  Neck:      Comments: Peak neck flexion causes discomfort in the rhomboid musculature Cardiovascular:     Pulses: Normal pulses.  Pulmonary:     Effort: Pulmonary effort is normal.  Musculoskeletal:     Cervical back: Normal range of motion.     Comments: There is tenderness palpation to the of the inferior rhomboids Eccentric abduction of the shoulder causes shimmering of the rhomboid musculature There is some mild inferior medial scapular winging at rest No tenderness over the spinal processes Tenderness of the trapezius and levator scapula with spasticity Right shoulder range of motion and strength is full  Skin:    General: Skin is warm.     Capillary Refill: Capillary refill takes less than 2 seconds.     Findings: No bruising or erythema.  Neurological:     General: No focal deficit present.     Mental Status: She is alert.     Sensory: No sensory deficit.  Psychiatric:        Mood and Affect: Mood normal.        Behavior: Behavior normal.        Thought Content: Thought content normal.        Judgment: Judgment normal.      UC Treatments / Results  Labs (all labs ordered are listed, but only abnormal results are displayed) Labs Reviewed - No data to display  EKG   Radiology No results found.  Procedures Procedures (including critical care time)  Medications Ordered in UC Medications  ketorolac  (TORADOL ) injection 60 mg (60 mg Intramuscular Given 11/26/23 1248)    Initial Impression / Assessment and Plan / UC Course  I have reviewed the triage vital signs and the nursing notes.  Pertinent labs & imaging results that were available during my care of the patient were reviewed by me and considered in my medical decision making (see chart for details).     Muscular spasm and pain of the right parascapular musculature - The patient's history, exam are not concerning for any red flag symptoms.  She does have some palpable spasticity of her upper rhomboids and levator  scapula on exam.  There is also some weakness of her inferior rhomboids which is likely leading to scapular dysfunction - Toradol  injection was given for some pain relief - Lidocaine  trigger point injections as below. - I also discussed the use of heat/ice, topical lidocaine  patches, and as needed anti-inflammatories for pain - I do think she would benefit from a course of physical therapy for scapular stabilizing muscular strengthening. - The patient voiced agreement and understanding of the plan.  Trigger Point Injection of the right rhomboid muscles Consent obtained and verified. Time-out conducted. Noted no overlying erythema, induration, or other signs of local infection. Skin was prepped in a sterile fashion. Topical analgesic spray: Ethyl chloride Completed without difficulty and patient tolerated well.  I injected 3 trigger points along the right rhomboid muscles  Meds: 1% Xylocaine  a total of 3.3 cc was injected into each trigger point for 10 cc total Patient reported some immediate relief. Postprocedural care was discussed. Advised to  call if fevers/chills, erythema, induration, drainage, or persistent bleeding.  Final Clinical Impressions(s) / UC Diagnoses   Final diagnoses:  Muscle spasm of back     Discharge Instructions      You can apply lidocaine  patches to the area of pain You can also try warmth and ice for relief When able I would recommend following up with your PCP and starting physical therapy for some weakness in your lower rhomboid muscles which is likely adding to the dysfunction and recurrent muscle spasms      ED Prescriptions   None    PDMP not reviewed this encounter.   Claybon Cuna, MD 11/27/23 [id number]

## 2023-11-26 NOTE — Discharge Instructions (Addendum)
 You can apply lidocaine  patches to the area of pain You can also try warmth and ice for relief When able I would recommend following up with your PCP and starting physical therapy for some weakness in your lower rhomboid muscles which is likely adding to the dysfunction and recurrent muscle spasms

## 2023-11-26 NOTE — ED Triage Notes (Signed)
 Patient here today with c/o right posterior shoulder pain since 6:30 am today. Patient states that she had the same pain on 11/16/2023. Heat helps. Patient states that she has a h/o right side breast cancer and has had a right mastectomy.

## 2023-11-30 ENCOUNTER — Other Ambulatory Visit: Payer: Self-pay

## 2023-11-30 DIAGNOSIS — M810 Age-related osteoporosis without current pathological fracture: Secondary | ICD-10-CM | POA: Insufficient documentation

## 2023-11-30 DIAGNOSIS — Z170 Estrogen receptor positive status [ER+]: Secondary | ICD-10-CM

## 2023-11-30 NOTE — Assessment & Plan Note (Deleted)
 invasive ductal carcinoma, cT2N1M0 stage IB, ER+/PR+/HER2+, G3  -Diagnosed 02/2018, s/p neoadjuvant TCHP, R mastectomy/ALND, and adjuvant radiation. She completed 1 year maintenance herceptin /perjeta  in 03/2019 and Neratinib  08/2019 - 08/2020. -s/p reconstruction by Dr. Marieta Shorten 07/26/19 -she started anti-estrogen therapy 12/2018, did not tolerate tamoxifen , anastrozole , letrozole , or exemestane  (including low dose). She has been off since 06/24/21. -most recent left mammogram on 06/2023 was negative.

## 2023-11-30 NOTE — Assessment & Plan Note (Deleted)
-  DEXA on 08/26/19 showed osteoporosis of left femur (T-score of -2.7) -she completed 2 years Zometa  02/2020 - 08/2021 -repeat DEXA 10/03/21 showed slight improvement to -2.6 in left hip.

## 2023-12-01 ENCOUNTER — Inpatient Hospital Stay: Admitting: Hematology

## 2023-12-01 ENCOUNTER — Inpatient Hospital Stay

## 2023-12-01 ENCOUNTER — Telehealth: Payer: Self-pay

## 2023-12-01 DIAGNOSIS — Z170 Estrogen receptor positive status [ER+]: Secondary | ICD-10-CM

## 2023-12-01 DIAGNOSIS — M816 Localized osteoporosis [Lequesne]: Secondary | ICD-10-CM

## 2023-12-01 NOTE — Telephone Encounter (Signed)
 Patient did not arrive for her scheduled Lab / office visit. Reached out to the patient via telephone call to inquire whereabouts. Unable to reach the patient. Left voicemail stating to contact the facility.

## 2023-12-02 ENCOUNTER — Telehealth: Payer: Self-pay | Admitting: Hematology

## 2023-12-03 ENCOUNTER — Telehealth: Payer: Self-pay | Admitting: Hematology

## 2023-12-03 NOTE — Progress Notes (Signed)
 Office Visit Note  Patient: Claire Guzman             Date of Birth: 03-14-1966           MRN: [id number]             PCP: Rito Chess, MD Referring: Rito Chess, MD Visit Date: 12/08/2023 Occupation: @GUAROCC @  Subjective:  Rash and fatigue   History of Present Illness: Claire Guzman is a 58 y.o. 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  She returns today for follow-up visit after her last visit on June 24, 2023.  She states she developed a rash on her scalp and her extremities last week which are gradually getting better.  She has an appointment coming up with the dermatologist.  She states she had some sun exposure but did not get real sunburn.  She denies any oral ulcers.  She continues to have dry mouth and dry eyes.  She complains of pain in the bilateral trapezius region and bilateral shoulders.  She states her left shoulder hurts at night.  She has some discomfort in her knee joints. On Nov 26, 2023 she developed some scapular and trapezius pain.  She was seen at the [facility] urgent care.  She was given Toradol  injection and trigger point injections x 3.  It helped after a while.    Activities of Daily Living:  Patient reports morning stiffness for a few minutes.   Patient Reports nocturnal pain.  Difficulty dressing/grooming: Denies Difficulty climbing stairs: Reports Difficulty getting out of chair: Denies Difficulty using hands for taps, buttons, cutlery, and/or writing: Denies  Review of Systems  Constitutional:  Positive for fatigue.  HENT:  Positive for mouth dryness. Negative for mouth sores.   Eyes:  Positive for dryness.  Respiratory:  Negative for shortness of breath.   Cardiovascular:  Negative for chest pain and palpitations.  Gastrointestinal:  Positive for constipation. Negative for blood in stool and diarrhea.  Endocrine: Positive for increased urination.  Genitourinary:  Negative for involuntary urination.   Musculoskeletal:  Positive for joint pain, joint pain, joint swelling, morning stiffness and muscle tenderness. Negative for gait problem, myalgias, muscle weakness and myalgias.  Skin:  Positive for hair loss and sensitivity to sunlight. Negative for color change and rash.  Allergic/Immunologic: Negative for susceptible to infections.  Neurological:  Positive for dizziness. Negative for headaches.  Hematological:  Negative for swollen glands.  Psychiatric/Behavioral:  Positive for depressed mood and sleep disturbance. The patient is nervous/anxious.     PMFS History:  Patient Active Problem List   Diagnosis Date Noted   Osteoporosis 11/30/2023   Elevated troponin 11/13/2022   Essential hypertension 11/13/2022   GERD (gastroesophageal reflux disease) 11/13/2022   GAD (generalized anxiety disorder) 11/13/2022   Chest pain 11/12/2022   Plantar fasciitis 12/12/2021   Heel spur, left 12/12/2021   Headache, acute 08/08/2021   Low back pain 05/14/2021   Acquired absence of right breast 09/22/2018   Port-A-Cath in place 05/11/2018   Genetic testing 04/12/2018   Family history of prostate cancer    Family history of colon cancer    Family history of melanoma    Malignant neoplasm of upper-inner quadrant of right breast in female, estrogen receptor positive (HCC) 03/10/2018    Past Medical History:  Diagnosis Date   Abdominal pain, unspecified site    Anxiety state, unspecified    Complication of anesthesia    convulsions - when she has appendectomy about 12 years  ago at [facility]   Contact dermatitis and other eczema, due to unspecified cause    Essential hypertension, benign    Family history of colon cancer    Family history of melanoma    Family history of prostate cancer    GERD (gastroesophageal reflux disease)    Hematuria, unspecified    High cholesterol    Hypertension    Lower back pain    Mixed hyperlipidemia    Personal history of chemotherapy    Personal history  of radiation therapy    Rosacea    rt breast ca dx'd 01/2018   R breast cancer   Unspecified symptom associated with female genital organs     Family History  Problem Relation Age of Onset   Heart failure Mother    Melanoma Mother 37       has had several removed over the past ten years   Atrial fibrillation Mother    Prostate cancer Father 35   Stroke Father    Throat cancer Father    Healthy Daughter    Heart Problems Maternal Aunt        d.60   Heart Problems Maternal Uncle        d.60   Colon cancer Paternal Aunt        dx 46s   Colon cancer Paternal Uncle        dx 34s   Heart attack Maternal Grandmother    Heart attack Maternal Grandfather        possible cancer, unknown type   Stroke Paternal Grandmother    Colon cancer Paternal Grandfather        dx upper 67s   Breast cancer Neg Hx    BRCA 1/2 Neg Hx    Past Surgical History:  Procedure Laterality Date   BREAST RECONSTRUCTION WITH PLACEMENT OF TISSUE EXPANDER AND ALLODERM Right 09/14/2018   Procedure: BREAST RECONSTRUCTION WITH PLACEMENT OF TISSUE EXPANDER AND ALLODERM;  Surgeon: Alger Infield, MD;  Location: [facility] OR;  Service: Plastics;  Laterality: Right;   CESAREAN SECTION     DIAGNOSTIC LAPAROSCOPY     EYE SURGERY     Lasik   LAPAROSCOPIC APPENDECTOMY     LEFT HEART CATH AND CORONARY ANGIOGRAPHY N/A 11/13/2022   Procedure: LEFT HEART CATH AND CORONARY ANGIOGRAPHY;  Surgeon: Odie Benne, MD;  Location: [facility];  Service: Cardiovascular;  Laterality: N/A;   LIPOSUCTION WITH LIPOFILLING Right 07/26/2019   Procedure: LIPOFILLING from abdomen to R chest;  Surgeon: Alger Infield, MD;  Location: [facility];  Service: Plastics;  Laterality: Right;   MASTECTOMY Right    2019   MASTECTOMY WITH RADIOACTIVE SEED GUIDED EXCISION AND AXILLARY SENTINEL LYMPH NODE BIOPSY Right 09/14/2018   Procedure: RIGHT NIPPLE SPARING MASTECTOMY WITH TARGETED DEEP RIGHT AXILLARY LYMPH NODE  BIOPSY WITH RADIOACTIVE SEED LOCALIZATION AND RIGHT AXILLARY SENTINEL LYMPH NODE BIOPSY, FROZEN SECTION AND POSSIBLE COMPLETION AXILLARY LYMPH NODE DISSECTION INJECT BLUE DYE RIGHT BREAST;  Surgeon: Boyce Byes, MD;  Location: [facility];  Service: General;  Laterality: Right;   PLANTAR FASCIA SURGERY Left 04/17/2022   PORT-A-CATH REMOVAL Left 07/26/2019   Procedure: REMOVAL PORT-A-CATH;  Surgeon: Alger Infield, MD;  Location: [facility];  Service: Plastics;  Laterality: Left;   PORTACATH PLACEMENT Left 04/13/2018   Procedure: INSERTION PORT-A-CATH WITH US ;  Surgeon: Boyce Byes, MD;  Location: [facility];  Service: General;  Laterality: Left;   REMOVAL OF TISSUE EXPANDER AND  PLACEMENT OF IMPLANT Right 07/26/2019   Procedure: REMOVAL OF TISSUE EXPANDER AND PLACEMENT OF IMPLANT;  Surgeon: Alger Infield, MD;  Location: [facility];  Service: Plastics;  Laterality: Right;   UTERINE FIBROID SURGERY     Social History   Social History Narrative   Not on file   Immunization History  Administered Date(s) Administered   Influenza,inj,Quad PF,6+ Mos 05/11/2018, 05/16/2019, 05/04/2020   Moderna Sars-Covid-2 Vaccination 10/13/2019, 11/10/2019, 08/14/2020   Tdap 02/06/2022     Objective: Vital Signs: BP 139/82 (BP Location: Left Arm, Patient Position: Sitting, Cuff Size: Normal)   Pulse 79   Resp 14   Ht 5\' 4"  (1.626 m)   Wt 168 lb (76.2 kg)   LMP 03/28/2018 (Within Days)   BMI 28.84 kg/m    Physical Exam Vitals and nursing note reviewed.  Constitutional:      Appearance: She is well-developed.  HENT:     Head: Normocephalic and atraumatic.  Eyes:     Conjunctiva/sclera: Conjunctivae normal.  Cardiovascular:     Rate and Rhythm: Normal rate and regular rhythm.     Heart sounds: Normal heart sounds.  Pulmonary:     Effort: Pulmonary effort is normal.     Breath sounds: Normal breath sounds.  Abdominal:     General: Bowel sounds  are normal.     Palpations: Abdomen is soft.  Musculoskeletal:     Cervical back: Normal range of motion.  Lymphadenopathy:     Cervical: No cervical adenopathy.  Skin:    General: Skin is warm and dry.     Capillary Refill: Capillary refill takes less than 2 seconds.     Comments: Erythema was noted over bilateral arms and upper back.  Excoriation marks were noted on her left forearm.  Neurological:     Mental Status: She is alert and oriented to person, place, and time.  Psychiatric:        Behavior: Behavior normal.      Musculoskeletal Exam: Cervical, thoracic and lumbar spine with good range of motion.  Shoulders, elbows, wrist joints with good range of motion.  She had bilateral PIP and DIP thickening with no synovitis.  Hip joints and knee joints with good range of motion without any warmth swelling or effusion.  There was no tenderness over ankles or MTPs.  CDAI Exam: CDAI Score: -- Patient Global: --; Provider Global: -- Swollen: --; Tender: -- Joint Exam 12/08/2023   No joint exam has been documented for this visit   There is currently no information documented on the homunculus. Go to the Rheumatology activity and complete the homunculus joint exam.  Investigation: No additional findings.  Imaging: No results found.  Recent Labs: Lab Results  Component Value Date   WBC 4.7 12/07/2023   HGB 13.6 12/07/2023   PLT 196 12/07/2023   NA 142 12/07/2023   K 3.8 12/07/2023   CL 105 12/07/2023   CO2 31 12/07/2023   GLUCOSE 80 12/07/2023   BUN 13 12/07/2023   CREATININE 0.63 12/07/2023   BILITOT 0.6 12/07/2023   ALKPHOS 72 12/07/2023   AST 17 12/07/2023   ALT 10 12/07/2023   PROT 7.3 12/07/2023   ALBUMIN 4.6 12/07/2023   CALCIUM  9.5 12/07/2023   GFRAA >60 03/02/2020   QFTBGOLDPLUS NEGATIVE 02/03/2023    Dec 07, 2023 CBC with differential and CMP were normal.  Speciality Comments: PLQ Eye Exam: 07/30/2022 WNL @ [facility] Follow up 1 year.    Procedures:  No procedures performed Allergies: Trazodone  and nefazodone, Metrogel [metronidazole], Other, Paxil [paroxetine hcl], Sulfamethoxazole , Zomig [zolmitriptan], and Prednisone    Assessment / Plan:     Visit Diagnoses: Undifferentiated connective tissue disease (HCC) - Positive ANA, positive double-stranded DNA, dry mouth, polyarthralgia: -Repeat double-stranded ENA was negative.  She continues to have dry mouth, dry eyes, photosensitivity, arthralgias and fatigue.  Patient went on vacation about 3 weeks ago and was exposed to sun.  She states she developed a sunburn on the nape of her neck and also some redness on her arms.  She has noticed some rash on her scalp and lower extremities.  Which is gradually getting better.  She states she has been using topical agents.  Plan: Protein / creatinine ratio, urine, Anti-DNA antibody, double-stranded, C3 and C4, Sedimentation rate  High risk medication use - Plaquenil  200 mg 1 tablet by mouth twice daily Monday through Friday.  Plaquenil  was initiated in November 2023. PLQ Eye Exam: 07/30/2022.  Patient was advised to get repeat eye examination.  CBC and CMP were normal on Dec 07, 2023.  Rash-patient reports rash on his scalp and lower extremities.  Excoriation marks were noted on her left forearm.  Erythema was noted on her nape of the neck and bilateral arms most likely due to photosensitivity.  Patient states the rash is improving.  She has an appointment coming up with the dermatologist.  Use of sunscreen and sun protection was discussed at length.  Increased risk of autoimmune disease flare with sun exposure was also discussed.  Chronic pain of both shoulders-she had discomfort with forward flexion and abduction.  Previous x-rays were reviewed which showed acromioclavicular joint arthritis.  A handout on shoulder joint exercises was given.  Primary osteoarthritis of both hands-she had bilateral PIP and DIP thickening without  discomfort.  Chondromalacia patellae, right knee-she has intermittent discomfort in her knee joint.  Plantar fasciitis -currently not symptomatic.  S/p surgical intervention for Plantar fasciitis by Dr. Clydia Dart on April 17, 2022.  Surgical scar is healed.  Arthropathy of cervical facet joint-she continues to have some discomfort in her cervical region.  She had recent trigger point injections at urgent care.  She was also given Toradol  injection followed cervical and thoracic muscle spasm.  Degeneration of intervertebral disc of lumbar region without discogenic back pain or lower extremity pain-she is lower back pain.  Other secondary scoliosis, lumbar region  Other medical problems are listed as follows:  Age-related osteoporosis without current pathological fracture - On Zometa  infusions by her oncologist.  Malignant neoplasm of upper-inner quadrant of right breast in female, estrogen receptor positive (HCC)  Acquired absence of right breast  Basal cell carcinoma (BCC) of skin of other part of torso  Family history of melanoma-mother  Family history of colon cancer-paternal grandfather, paternal aunt, paternal uncle  Family history of prostate cancer-father  Orders: Orders Placed This Encounter  Procedures   Protein / creatinine ratio, urine   Anti-DNA antibody, double-stranded   C3 and C4   Sedimentation rate   No orders of the defined types were placed in this encounter.    Follow-Up Instructions: Return in about 5 months (around 05/09/2024) for UCTD.   Nicholas Bari, MD  Note - This record has been created using Animal nutritionist.  Chart creation errors have been sought, but may not always  have been located. Such creation errors do not reflect on  the standard of medical care.

## 2023-12-07 ENCOUNTER — Inpatient Hospital Stay (HOSPITAL_BASED_OUTPATIENT_CLINIC_OR_DEPARTMENT_OTHER): Admitting: Hematology

## 2023-12-07 ENCOUNTER — Inpatient Hospital Stay: Attending: Hematology

## 2023-12-07 VITALS — BP 120/74 | HR 72 | Temp 98.3°F | Resp 21 | Ht 64.0 in | Wt 169.2 lb

## 2023-12-07 DIAGNOSIS — Z79899 Other long term (current) drug therapy: Secondary | ICD-10-CM | POA: Insufficient documentation

## 2023-12-07 DIAGNOSIS — R221 Localized swelling, mass and lump, neck: Secondary | ICD-10-CM | POA: Diagnosis not present

## 2023-12-07 DIAGNOSIS — Z9011 Acquired absence of right breast and nipple: Secondary | ICD-10-CM | POA: Diagnosis not present

## 2023-12-07 DIAGNOSIS — M25511 Pain in right shoulder: Secondary | ICD-10-CM | POA: Insufficient documentation

## 2023-12-07 DIAGNOSIS — C50211 Malignant neoplasm of upper-inner quadrant of right female breast: Secondary | ICD-10-CM

## 2023-12-07 DIAGNOSIS — M329 Systemic lupus erythematosus, unspecified: Secondary | ICD-10-CM | POA: Insufficient documentation

## 2023-12-07 DIAGNOSIS — Z853 Personal history of malignant neoplasm of breast: Secondary | ICD-10-CM | POA: Insufficient documentation

## 2023-12-07 DIAGNOSIS — Z800 Family history of malignant neoplasm of digestive organs: Secondary | ICD-10-CM | POA: Diagnosis not present

## 2023-12-07 DIAGNOSIS — Z170 Estrogen receptor positive status [ER+]: Secondary | ICD-10-CM | POA: Diagnosis not present

## 2023-12-07 DIAGNOSIS — Z923 Personal history of irradiation: Secondary | ICD-10-CM | POA: Insufficient documentation

## 2023-12-07 DIAGNOSIS — Z808 Family history of malignant neoplasm of other organs or systems: Secondary | ICD-10-CM | POA: Diagnosis not present

## 2023-12-07 DIAGNOSIS — Z9221 Personal history of antineoplastic chemotherapy: Secondary | ICD-10-CM | POA: Diagnosis not present

## 2023-12-07 DIAGNOSIS — G2401 Drug induced subacute dyskinesia: Secondary | ICD-10-CM | POA: Diagnosis not present

## 2023-12-07 DIAGNOSIS — M359 Systemic involvement of connective tissue, unspecified: Secondary | ICD-10-CM | POA: Insufficient documentation

## 2023-12-07 LAB — CMP (CANCER CENTER ONLY)
ALT: 10 U/L (ref 0–44)
AST: 17 U/L (ref 15–41)
Albumin: 4.6 g/dL (ref 3.5–5.0)
Alkaline Phosphatase: 72 U/L (ref 38–126)
Anion gap: 6 (ref 5–15)
BUN: 13 mg/dL (ref 6–20)
CO2: 31 mmol/L (ref 22–32)
Calcium: 9.5 mg/dL (ref 8.9–10.3)
Chloride: 105 mmol/L (ref 98–111)
Creatinine: 0.63 mg/dL (ref 0.44–1.00)
GFR, Estimated: >60 mL/min (ref >60)
Glucose, Bld: 80 mg/dL (ref 70–99)
Potassium: 3.8 mmol/L (ref 3.5–5.1)
Sodium: 142 mmol/L (ref 135–145)
Total Bilirubin: 0.6 mg/dL (ref 0.0–1.2)
Total Protein: 7.3 g/dL (ref 6.5–8.1)

## 2023-12-07 LAB — CBC WITH DIFFERENTIAL (CANCER CENTER ONLY)
Abs Immature Granulocytes: 0.01 10*3/uL (ref 0.00–0.07)
Basophils Absolute: 0 10*3/uL (ref 0.0–0.1)
Basophils Relative: 0 %
Eosinophils Absolute: 0.1 10*3/uL (ref 0.0–0.5)
Eosinophils Relative: 3 %
HCT: 36.9 % (ref 36.0–46.0)
Hemoglobin: 13.6 g/dL (ref 12.0–15.0)
Immature Granulocytes: 0 %
Lymphocytes Relative: 37 %
Lymphs Abs: 1.7 10*3/uL (ref 0.7–4.0)
MCH: 31.9 pg (ref 26.0–34.0)
MCHC: 36.9 g/dL — ABNORMAL HIGH (ref 30.0–36.0)
MCV: 86.4 fL (ref 80.0–100.0)
Monocytes Absolute: 0.3 10*3/uL (ref 0.1–1.0)
Monocytes Relative: 7 %
Neutro Abs: 2.5 10*3/uL (ref 1.7–7.7)
Neutrophils Relative %: 53 %
Platelet Count: 196 10*3/uL (ref 150–400)
RBC: 4.27 MIL/uL (ref 3.87–5.11)
RDW: 11.9 % (ref 11.5–15.5)
WBC Count: 4.7 10*3/uL (ref 4.0–10.5)
nRBC: 0 % (ref 0.0–0.2)

## 2023-12-07 NOTE — Progress Notes (Signed)
 [facility]   [phone] [phone]   Clinic Follow up Note   Patient Care Team: Rito Chess, MD as PCP - General (Family Medicine) Nahser, Lela Purple, MD as PCP - Cardiology (Cardiology) Boyce Byes, MD as Consulting Physician (General Surgery) Sonja [place], MD as Consulting Physician (Hematology) Colie Dawes, MD as Attending Physician (Radiation Oncology) Auther Bo, RN as Oncology Nurse Navigator Alane Hsu, RN as Oncology Nurse Navigator  Date of Service:  12/07/2023  CHIEF COMPLAINT: f/u of right breast cancer  CURRENT THERAPY:  Cancer surveillance  Oncology History   Malignant neoplasm of upper-inner quadrant of right breast in female, 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 She completed 1 year maintenance herceptin /perjeta  in 03/2019 and Neratinib  08/2019 - 08/2020. -s/p reconstruction by Dr. Marieta Shorten 07/26/19 -she started anti-estrogen therapy 12/2018, did not tolerate tamoxifen , anastrozole , letrozole , or exemestane  (including low dose). She has been off since 06/24/21. -most recent left mammogram on 06/2023 was negative.  Assessment & Plan Breast cancer follow-up Breast cancer with mastectomy and reconstruction on the right side in 2010. A lump on the right side of the neck and discomfort in the breast area were identified. No high suspicion for cancer recurrence, but discussed the possibility of late recurrence even after five or ten years. Mammogram in December last year was negative, but she expressed distrust as it did not initially detect the cancer. Discussed blood tests like Guardian Review and Signatera to detect minimal residual disease. - Order ultrasound of the neck - Order Guardian Review blood test to detect minimal residual disease - Schedule follow-up appointment  for ultrasound and blood test results  Undifferentiated connective tissue disease Diagnosed two years ago. Currently on hydroxychloroquine . Reports blisters and itching, and mild shoulder pain,  possibly related to her autoimmune disease. Follow-up with rheumatology scheduled for tomorrow.  Tardive dyskinesia Reports involuntary mouth movements, possibly related to tardive dyskinesia.  Plan - Lab reviewed, exam was unremarkable, I encouraged her to follow-up with her vascular surgeon regarding her concern of right breat implant  -US  of neck to evaluate her right side node  - Lab and follow-up in 1 year -will obtain GuardvantReveal next week    SUMMARY OF ONCOLOGIC HISTORY: Oncology History Overview Note  Cancer Staging Malignant neoplasm of upper-inner quadrant of right breast in female, estrogen receptor positive (HCC) Staging form: Breast, AJCC 8th Edition - Clinical stage from 03/08/2018: Stage IB (cT2, cN1, cM0, G3, ER+, PR+, HER2+) - Signed by Sonja [place], MD on 03/16/2018     Malignant neoplasm of upper-inner quadrant of right breast in female, estrogen receptor positive (HCC)  02/24/2018 Mammogram   screening mammogram on 02/24/2018 that was benign   03/04/2018 Mammogram   diagnostic mammogram on 03/04/2018 due to a palpable mass on her right breast. Diagnostic mammogram, breast US  revealed and biopsy revealed suspicious mass in the right breast at 2:30 at the palpable site of concern and one suspicious right axillary LN.   03/08/2018 Cancer Staging   Staging form: Breast, AJCC 8th Edition - Clinical stage from 03/08/2018: Stage IB (cT2, cN1, cM0, G3, ER+, PR+, HER2+) - Signed by Sonja [place], MD on 03/16/2018   03/08/2018 Receptors her2   Her2 positive, ER 90% PR 5% and Ki67 60%   03/08/2018 Pathology Results   Diagnosis 1. Breast, right, needle core biopsy, 2:30 - INVASIVE DUCTAL CARCINOMA, GRADE 3. - LYMPHOVASCULAR  SPACE INVOLVEMENT BY TUMOR. 2. Lymph node, needle/core biopsy,  right axilla - METASTATIC CARCINOMA INVOLVING SCANT LYMPHOID TISSUE.   03/10/2018 Initial Diagnosis   Malignant neoplasm of upper-inner quadrant of right breast in female, estrogen receptor positive (HCC)   03/20/2018 Imaging   MRI brain 03/20/18 IMPRESSION: No evidence of metastatic disease.  Normal appearance of brain. Slightly heterogeneous marrow pattern of the upper cervical spine, nonspecific but likely benign, especially in the setting of early stage disease.    03/24/2018 Echocardiogram   Baseline ECHO 03/24/18 Study Conclusions - Left ventricle: The cavity size was normal. Wall thickness was   increased in a pattern of mild LVH. Systolic function was normal.   The estimated ejection fraction was in the range of 60% to 65%.   Wall motion was normal; there were no regional wall motion   abnormalities. Doppler parameters are consistent with abnormal   left ventricular relaxation (grade 1 diastolic dysfunction). - Right ventricle: The cavity size was mildly dilated. Wall   thickness was normal.   03/25/2018 Imaging   MRI Breast B/l 03/25/18 IMPRESSION: 1. The patient's primary malignancy in the posterior right breast measures 3.2 x 2 x 2.6 cm with apparent invasion of the underlying pectoralis muscle. Numerous surrounding abnormal enhancing masses, consistent with satellite lesions, extend from 11 o'clock in the right breast into the inferolateral right breast with a total span of suspected disease measuring 4.5 x 4.6 x 6.4 cm in AP, transverse, and craniocaudal dimension. The suspected extent of disease involves the superior and inferolateral quadrants. The primary malignancy also extends just across midline into the superior medial quadrant. 2. No MRI evidence of malignancy in the left breast. 3. Known metastatic noted in the right axilla.   03/25/2018 Imaging   Whole body bone scan 03/25/18 IMPRESSION: No scintigraphic evidence skeletal metastasis.   03/25/2018 Imaging    CT CAP W Contrast 03/25/18 IMPRESSION: 1. Mass within the deep aspect of the right breast along the pectoralis muscle compatible with primary breast malignancy. 2. There is suggestion of two additional possible masses within the lateral aspect of the right breast versus nodular appearing breast tissue. Recommend dedicated evaluation with bilateral breast MRI. 3. Mildly thickened right axillary lymph node compatible with metastatic adenopathy, recently biopsied. 4. Multiple bilateral pulmonary nodules are indeterminate, potentially sequelae of prior infectious/inflammatory process. Recommend attention on follow-up. Consider follow-up CT in 6 months. 5. Portal venous gas is demonstrated within the left hepatic lobe. Additionally, there is wall thickening of the cecum and ascending colon with small amount of gas within the pericolonic vasculature. Constellation of findings is indeterminate in etiology however may be secondary to colitis at this location with considerations including infectious, inflammatory or ischemic etiologies. Correlate for recent procedure. If patient is not up-to-date for colonic screening, recommend further evaluation with colonoscopy to exclude the possibility of colonic mass within the cecum/ascending colon. 6. These results were called by telephone at the time of interpretation on 03/25/2018 at 9:31 am to Dr. Sonja Waucoma , who verbally acknowledged these results.   03/30/2018 - 04/08/2019 Chemotherapy   Neoadjuvant TCHP every 3 weeks starting 03/30/18. Completed 6 cycles of TCHP on 07/23/18.  Continued with maintenance Herceptin /Perjeta  q3weeks to complete 1 year.    04/09/2018 Genetic Testing   Negative genetic testing on the [employer] Common Hereditary Cancers Panel. The Common Hereditary Cancers Panel offered by [facility] includes sequencing and/or deletion duplication testing of the following 47 genes: APC, ATM, AXIN2, BARD1, BMPR1A, BRCA1, BRCA2, BRIP1, CDH1, CDKN2A  (  p14ARF), CDKN2A (p16INK4a), CKD4, CHEK2, CTNNA1, DICER1, EPCAM (Deletion/duplication testing only), GREM1 (promoter region deletion/duplication testing only), KIT, MEN1, MLH1, MSH2, MSH3, MSH6, MUTYH, NBN, NF1, NHTL1, PALB2, PDGFRA, PMS2, POLD1, POLE, PTEN, RAD50, RAD51C, RAD51D, SDHB, SDHC, SDHD, SMAD4, SMARCA4. STK11, TP53, TSC1, TSC2, and VHL.  The following genes were evaluated for sequence changes only: SDHA and HOXB13 c.251G>A variant only.   Genetic testing did detect a Variant of Unknown Significance (VUS) in the POLD1 gene called c.985C>T (p.Pro329Ser). At this time, it is unknown if this variant is associated with increased cancer risk or if this is a normal finding, but most variants such as this get reclassified to being inconsequential. It should not be used to make medical management decisions.   The report date is 04/09/2018.   07/24/2018 Breast MRI   IMPRESSION: 1. Resolution of the enhancing mass (known cancer) and small satellite lesions previously seen in the right breast following chemotherapy.   2. The metastatic lymph node in the right axilla has decreased in size.   3.  No MRI evidence of left breast malignancy.   09/14/2018 Surgery   RIGHT NIPPLE SPARING MASTECTOMY WITH TARGETED DEEP RIGHT AXILLARY LYMPH NODE BIOPSY WITH RADIOACTIVE SEED LOCALIZATION AND RIGHT AXILLARY SENTINEL LYMPH NODE BIOPSY by Dr. Lauralee Poll  09/14/18    09/14/2018 Pathology Results   Diagnosis 1. Lymph node, sentinel, biopsy, right axillary with radioactive seed - ONE OF ONE LYMPH NODES NEGATIVE FOR CARCINOMA (0/1). - MILD TREATMENT EFFECT. - BIOPSY SITE. 2. Lymph node, sentinel, biopsy, right axillary - ONE OF ONE LYMPH NODES NEGATIVE FOR CARCINOMA (0/1). 3. Lymph node, sentinel, biopsy, right - ONE OF ONE LYMPH NODES NEGATIVE FOR CARCINOMA (0/1). 4. Lymph node, sentinel, biopsy, right - ONE OF ONE LYMPH NODES NEGATIVE FOR CARCINOMA (0/1). 5. Lymph node, sentinel, biopsy, right - ONE OF ONE  LYMPH NODES NEGATIVE FOR CARCINOMA (0/1). 6. Lymph node, sentinel, biopsy, right - ONE OF ONE LYMPH NODES NEGATIVE FOR CARCINOMA (0/1). 7. Lymph node, sentinel, biopsy, right - ONE OF ONE LYMPH NODES NEGATIVE FOR CARCINOMA (0/1). 8. Lymph node, sentinel, biopsy, right - ONE OF ONE LYMPH NODES NEGATIVE FOR CARCINOMA (0/1). 9. Lymph node, sentinel, biopsy, right - ONE OF ONE LYMPH NODES NEGATIVE FOR CARCINOMA (0/1). 10. Lymph node, sentinel, biopsy, right - ONE OF ONE LYMPH NODES NEGATIVE FOR CARCINOMA (0/1). 11. Lymph node, sentinel, biopsy, right - ONE OF ONE LYMPH NODES NEGATIVE FOR CARCINOMA (0/1). 12. Lymph node, sentinel, biopsy, right - ONE OF ONE LYMPH NODES NEGATIVE FOR CARCINOMA (0/1). 13. Nipple Biopsy, right - BENIGN NIPPLE TISSUE. 14. Breast, simple mastectomy, right - NO RESIDUAL CARCINOMA IDENTIFIED. - TREATMENT EFFECT. - FIBROCYSTIC CHANGE. - ONE OF ONE LYMPH NODES NEGATIVE FOR CARCINOMA (0/1). - SEE ONCOLOGY TABLE.   09/14/2018 Cancer Staging   Staging form: Breast, AJCC 8th Edition - Pathologic stage from 09/14/2018: No Stage Recommended (ypT0, pN0, cM0, GX, ER: Not Assessed, PR: Not Assessed, HER2: Not Assessed) - Signed by Sonja Santel, MD on 09/23/2018   11/03/2018 - 12/10/2018 Radiation Therapy   Adjuvant Radiation with Dr. Lurena Sally    12/27/2018 -  Anti-estrogen oral therapy   Start Tamoxifen  20mg  daily in 12/27/2018 stopped after 1 week due to poor toleration. She started anastrozole  on 02/04/19 but stopped on 02/14/19 due to worsening muscle aches. She tried Letrozole  but did not tolerate. She was switched to Exemestane  in late 04/2019.    01/04/2019 Imaging   CT chest  IMPRESSION: 1. Stable tiny subpleural and perifissural nodules  bilaterally. No suspicious pulmonary nodule or mass. 2. No focal airspace disease in the right lung. 3.  Aortic Atherosclerois (ICD10-170.0)     03/08/2019 Imaging   CT AP W Contrast 03/08/19  IMPRESSION: 1. No metastatic disease in the  abdomen pelvis. 2. No evidence adverse immunotherapy therapy reaction.   03/08/2019 Imaging   Whole body bone scan 03/08/19  IMPRESSION: No definite scintigraphic evidence of osseous metastases.   08/29/2019 - 08/29/2020 Chemotherapy   Started Nerlynx  240mg  daily with budesonide  on 08/29/19. Reduced to 200mg  in late 08/2019. Completed on 08/29/20        Discussed the use of AI scribe software for clinical note transcription with the patient, who gave verbal consent to proceed.  History of Present Illness Claire Guzman is a 58 year old female with breast cancer who presents for follow-up.  She has a hard lump on the right side of her neck, possibly a lymph node, present for several months, with associated numbness. She also notices some areas in her breast. Severe pain occurs behind her right shoulder blade, initially thought to be back pain or muscle spasm, with two episodes, the latest occurring a week or two before a trip to the beach. She received injections for pain relief at urgent care.  Her medical history includes breast cancer diagnosed in 2010, with surgery and reconstruction completed in 2020, including a mastectomy and saline implant on the right side. She has lupus, diagnosed two years ago, and is on hydroxychloroquine . She also has rheumatoid arthritis, with blisters and itching after sun exposure.  Current medications include Buspar, Xanax , hydroxychloroquine , curcumin, folic acid , nitroglycerin , potassium, atorvastatin , baby aspirin  as needed, Protonix , Effexor , and atenolol . She has stopped taking Zetia . Hair thinning has slowed recently, attributed to stopping anti-estrogen therapy. Tenderness in her neck and shoulder is associated with rheumatoid arthritis. No ear infections, allergies, mouth infections, or sore throat.     All other systems were reviewed with the patient and are negative.  MEDICAL HISTORY:  Past Medical History:  Diagnosis Date   Abdominal pain,  unspecified site    Anxiety state, unspecified    Complication of anesthesia    convulsions - when she has appendectomy about 12 years ago at [facility]   Contact dermatitis and other eczema, due to unspecified cause    Essential hypertension, benign    Family history of colon cancer    Family history of melanoma    Family history of prostate cancer    GERD (gastroesophageal reflux disease)    Hematuria, unspecified    High cholesterol    Hypertension    Lower back pain    Mixed hyperlipidemia    Personal history of chemotherapy    Personal history of radiation therapy    Rosacea    rt breast ca dx'd 01/2018   R breast cancer   Unspecified symptom associated with female genital organs     SURGICAL HISTORY: Past Surgical History:  Procedure Laterality Date   BREAST RECONSTRUCTION WITH PLACEMENT OF TISSUE EXPANDER AND ALLODERM Right 09/14/2018   Procedure: BREAST RECONSTRUCTION WITH PLACEMENT OF TISSUE EXPANDER AND ALLODERM;  Surgeon: Alger Infield, MD;  Location: [facility] OR;  Service: Plastics;  Laterality: Right;   CESAREAN SECTION     DIAGNOSTIC LAPAROSCOPY     EYE SURGERY     Lasik   LAPAROSCOPIC APPENDECTOMY     LEFT HEART CATH AND CORONARY ANGIOGRAPHY N/A 11/13/2022   Procedure: LEFT HEART CATH AND CORONARY ANGIOGRAPHY;  Surgeon: Abel Hoe,  Coy Ditty, MD;  Location: [facility];  Service: Cardiovascular;  Laterality: N/A;   LIPOSUCTION WITH LIPOFILLING Right 07/26/2019   Procedure: LIPOFILLING from abdomen to R chest;  Surgeon: Alger Infield, MD;  Location: [facility];  Service: Plastics;  Laterality: Right;   MASTECTOMY Right    2019   MASTECTOMY WITH RADIOACTIVE SEED GUIDED EXCISION AND AXILLARY SENTINEL LYMPH NODE BIOPSY Right 09/14/2018   Procedure: RIGHT NIPPLE SPARING MASTECTOMY WITH TARGETED DEEP RIGHT AXILLARY LYMPH NODE BIOPSY WITH RADIOACTIVE SEED LOCALIZATION AND RIGHT AXILLARY SENTINEL LYMPH NODE BIOPSY, FROZEN SECTION AND POSSIBLE  COMPLETION AXILLARY LYMPH NODE DISSECTION INJECT BLUE DYE RIGHT BREAST;  Surgeon: Boyce Byes, MD;  Location: [facility] OR;  Service: General;  Laterality: Right;   PLANTAR FASCIA SURGERY Left 04/17/2022   PORT-A-CATH REMOVAL Left 07/26/2019   Procedure: REMOVAL PORT-A-CATH;  Surgeon: Alger Infield, MD;  Location: [facility];  Service: Plastics;  Laterality: Left;   PORTACATH PLACEMENT Left 04/13/2018   Procedure: INSERTION PORT-A-CATH WITH US ;  Surgeon: Boyce Byes, MD;  Location: [facility];  Service: General;  Laterality: Left;   REMOVAL OF TISSUE EXPANDER AND PLACEMENT OF IMPLANT Right 07/26/2019   Procedure: REMOVAL OF TISSUE EXPANDER AND PLACEMENT OF IMPLANT;  Surgeon: Alger Infield, MD;  Location: [facility];  Service: Plastics;  Laterality: Right;   UTERINE FIBROID SURGERY      I have reviewed the social history and family history with the patient and they are unchanged from previous note.  ALLERGIES:  is allergic to trazodone  and nefazodone, metrogel [metronidazole], other, paxil [paroxetine hcl], sulfamethoxazole , zomig [zolmitriptan], and prednisone .  MEDICATIONS:  Current Outpatient Medications  Medication Sig Dispense Refill   ALPRAZolam  (XANAX ) 0.5 MG tablet TAKE 1 TABLET(0.5 MG) BY MOUTH TWICE DAILY AS NEEDED FOR ANXIETY 30 tablet 1   aspirin  EC 81 MG tablet Take 1 tablet (81 mg total) by mouth daily. Swallow whole. (Patient taking differently: Take 81 mg by mouth as needed. Swallow whole.) 30 tablet 12   atenolol  (TENORMIN ) 50 MG tablet Take 50 mg by mouth at bedtime.     atorvastatin  (LIPITOR ) 40 MG tablet Take 1 tablet (40 mg total) by mouth daily. 90 tablet 3   busPIRone (BUSPAR) 5 MG tablet Take 1 tablet by mouth 2 (two) times daily.     folic acid  (FOLVITE ) 1 MG tablet Take 2 tablets (2 mg total) by mouth daily. 180 tablet 3   hydroxychloroquine  (PLAQUENIL ) 200 MG tablet TAKE 1 TABLET TWICE DAILY MONDAY-FRIDAY 120  tablet 0   pantoprazole  (PROTONIX ) 40 MG tablet Take 40 mg by mouth daily.     valACYclovir  (VALTREX ) 500 MG tablet Take 500 mg by mouth daily as needed (For outbreaks).     venlafaxine  XR (EFFEXOR -XR) 150 MG 24 hr capsule TAKE 1 CAPSULE(150 MG) BY MOUTH DAILY WITH BREAKFAST 30 capsule 5   nitroGLYCERIN  (NITROSTAT ) 0.4 MG SL tablet Place 1 tablet (0.4 mg total) under the tongue every 5 (five) minutes as needed for chest pain. 100 tablet 3   potassium chloride  (KLOR-CON ) 10 MEQ tablet Take 1 tablet (10 mEq total) by mouth 2 (two) times daily. 180 tablet 3   No current facility-administered medications for this visit.    PHYSICAL EXAMINATION: ECOG PERFORMANCE STATUS: 1 - Symptomatic but completely ambulatory  Vitals:   12/07/23 1408  BP: 120/74  Pulse: 72  Resp: (!) 21  Temp: 98.3 F (36.8 C)  SpO2: 99%   Wt Readings from Last  3 Encounters:  12/07/23 169 lb 3.2 oz (76.7 kg)  11/26/23 165 lb (74.8 kg)  06/24/23 169 lb 6.4 oz (76.8 kg)     GENERAL:alert, no distress and comfortable SKIN: skin color, texture, turgor are normal, no rashes or significant lesions EYES: normal, Conjunctiva are pink and non-injected, sclera clear NECK: supple, thyroid  normal size, non-tender, without nodularity except a small node in upper right lateral neck  LYMPH:  no other palpable lymphadenopathy in the cervical, axillary  LUNGS: clear to auscultation and percussion with normal breathing effort HEART: regular rate & rhythm and no murmurs and no lower extremity edema ABDOMEN:abdomen soft, non-tender and normal bowel sounds Musculoskeletal:no cyanosis of digits and no clubbing  NEURO: alert & oriented x 3 with fluent speech, no focal motor/sensory deficits BREAST: Status post right mastectomy and implant placement.  No palpable masses in the right breast implant or left breast, no axillary adenopathy.  Physical Exam   LABORATORY DATA:  I have reviewed the data as listed    Latest Ref Rng & Units  12/07/2023    1:50 PM 08/19/2023   12:23 PM 06/24/2023    1:27 PM  CBC  WBC 4.0 - 10.5 K/uL 4.7  5.1  4.6   Hemoglobin 12.0 - 15.0 g/dL 09.8  11.9  14.7   Hematocrit 36.0 - 46.0 % 36.9  36.2  37.9   Platelets 150 - 400 K/uL 196  221  204         Latest Ref Rng & Units 12/07/2023    1:50 PM 08/19/2023   12:23 PM 06/24/2023    1:27 PM  CMP  Glucose 70 - 99 mg/dL 80  92  86   BUN 6 - 20 mg/dL 13  12  12    Creatinine 0.44 - 1.00 mg/dL 8.29  5.62  1.30   Sodium 135 - 145 mmol/L 142  140  141   Potassium 3.5 - 5.1 mmol/L 3.8  3.6  3.9   Chloride 98 - 111 mmol/L 105  105  103   CO2 22 - 32 mmol/L 31  30  28    Calcium  8.9 - 10.3 mg/dL 9.5  9.6  9.7   Total Protein 6.5 - 8.1 g/dL 7.3  7.4  6.9   Total Bilirubin 0.0 - 1.2 mg/dL 0.6  0.5  0.4   Alkaline Phos 38 - 126 U/L 72  83    AST 15 - 41 U/L 17  21  17    ALT 0 - 44 U/L 10  13  10        RADIOGRAPHIC STUDIES: I have personally reviewed the radiological images as listed and agreed with the findings in the report. No results found.    Orders Placed This Encounter  Procedures   US  Soft Tissue Head/Neck    Standing Status:   Future    Expected Date:   12/14/2023    Expiration Date:   12/06/2024    Reason for Exam (SYMPTOM  OR DIAGNOSIS REQUIRED):   right lateral upper neck enlarged node    Preferred imaging location?:   [facility]   All questions were answered. The patient knows to call the clinic with any problems, questions or concerns. No barriers to learning was detected. The total time spent in the appointment was 30 minutes, including review of chart and various tests results, discussions about plan of care and coordination of care plan     Sonja [place], MD 12/07/2023

## 2023-12-07 NOTE — Assessment & Plan Note (Signed)
 invasive ductal carcinoma, cT2N1M0 stage IB, ER+/PR+/HER2+, G3  -Diagnosed 02/2018, s/p neoadjuvant TCHP, R mastectomy/ALND, and adjuvant radiation. She completed 1 year maintenance herceptin /perjeta  in 03/2019 and Neratinib  08/2019 - 08/2020. -s/p reconstruction by Dr. Marieta Shorten 07/26/19 -she started anti-estrogen therapy 12/2018, did not tolerate tamoxifen , anastrozole , letrozole , or exemestane  (including low dose). She has been off since 06/24/21. -most recent left mammogram on 06/2023 was negative.

## 2023-12-08 ENCOUNTER — Encounter: Payer: Self-pay | Admitting: Rheumatology

## 2023-12-08 ENCOUNTER — Ambulatory Visit: Payer: Medicaid Other | Attending: Rheumatology | Admitting: Rheumatology

## 2023-12-08 VITALS — BP 139/82 | HR 79 | Resp 14 | Ht 64.0 in | Wt 168.0 lb

## 2023-12-08 DIAGNOSIS — M51369 Other intervertebral disc degeneration, lumbar region without mention of lumbar back pain or lower extremity pain: Secondary | ICD-10-CM

## 2023-12-08 DIAGNOSIS — Z800 Family history of malignant neoplasm of digestive organs: Secondary | ICD-10-CM

## 2023-12-08 DIAGNOSIS — M25511 Pain in right shoulder: Secondary | ICD-10-CM

## 2023-12-08 DIAGNOSIS — Z9011 Acquired absence of right breast and nipple: Secondary | ICD-10-CM

## 2023-12-08 DIAGNOSIS — Z79899 Other long term (current) drug therapy: Secondary | ICD-10-CM

## 2023-12-08 DIAGNOSIS — G8929 Other chronic pain: Secondary | ICD-10-CM

## 2023-12-08 DIAGNOSIS — M19042 Primary osteoarthritis, left hand: Secondary | ICD-10-CM

## 2023-12-08 DIAGNOSIS — M810 Age-related osteoporosis without current pathological fracture: Secondary | ICD-10-CM

## 2023-12-08 DIAGNOSIS — M359 Systemic involvement of connective tissue, unspecified: Secondary | ICD-10-CM

## 2023-12-08 DIAGNOSIS — R21 Rash and other nonspecific skin eruption: Secondary | ICD-10-CM

## 2023-12-08 DIAGNOSIS — C50211 Malignant neoplasm of upper-inner quadrant of right female breast: Secondary | ICD-10-CM

## 2023-12-08 DIAGNOSIS — M47812 Spondylosis without myelopathy or radiculopathy, cervical region: Secondary | ICD-10-CM

## 2023-12-08 DIAGNOSIS — M25512 Pain in left shoulder: Secondary | ICD-10-CM

## 2023-12-08 DIAGNOSIS — M19041 Primary osteoarthritis, right hand: Secondary | ICD-10-CM | POA: Diagnosis not present

## 2023-12-08 DIAGNOSIS — C44519 Basal cell carcinoma of skin of other part of trunk: Secondary | ICD-10-CM

## 2023-12-08 DIAGNOSIS — Z8042 Family history of malignant neoplasm of prostate: Secondary | ICD-10-CM

## 2023-12-08 DIAGNOSIS — M722 Plantar fascial fibromatosis: Secondary | ICD-10-CM

## 2023-12-08 DIAGNOSIS — Z170 Estrogen receptor positive status [ER+]: Secondary | ICD-10-CM

## 2023-12-08 DIAGNOSIS — M4156 Other secondary scoliosis, lumbar region: Secondary | ICD-10-CM

## 2023-12-08 DIAGNOSIS — Z808 Family history of malignant neoplasm of other organs or systems: Secondary | ICD-10-CM

## 2023-12-08 DIAGNOSIS — M2241 Chondromalacia patellae, right knee: Secondary | ICD-10-CM

## 2023-12-08 NOTE — Patient Instructions (Signed)

## 2023-12-09 ENCOUNTER — Other Ambulatory Visit: Payer: Self-pay

## 2023-12-09 DIAGNOSIS — C50211 Malignant neoplasm of upper-inner quadrant of right female breast: Secondary | ICD-10-CM

## 2023-12-09 NOTE — Progress Notes (Signed)
 Order given by Dr. Linden Revels Reveal.  Order placed and paperwork done online.  Test Requisition form given to Winda Hastings in [facility] lab.

## 2023-12-10 LAB — PROTEIN / CREATININE RATIO, URINE
Creatinine, Urine: 22 mg/dL (ref 20–275)
Total Protein, Urine: <4 mg/dL — ABNORMAL LOW (ref 5–24)

## 2023-12-10 LAB — C3 AND C4
C3 Complement: 147 mg/dL (ref 83–193)
C4 Complement: 17 mg/dL (ref 15–57)

## 2023-12-10 LAB — ANTI-DNA ANTIBODY, DOUBLE-STRANDED: ds DNA Ab: 105 [IU]/mL — ABNORMAL HIGH

## 2023-12-10 LAB — SEDIMENTATION RATE: Sed Rate: 6 mm/h (ref 0–30)

## 2023-12-11 ENCOUNTER — Ambulatory Visit: Payer: Self-pay | Admitting: Rheumatology

## 2023-12-11 ENCOUNTER — Other Ambulatory Visit: Payer: Self-pay

## 2023-12-11 DIAGNOSIS — C50211 Malignant neoplasm of upper-inner quadrant of right female breast: Secondary | ICD-10-CM

## 2023-12-11 NOTE — Progress Notes (Signed)
 Urine protein creatinine ratio is normal, double-stranded DNA high positive, C3-C4 normal, sed rate normal.  Labs do not indicate an autoimmune disease flare.  Will continue to monitor labs.

## 2023-12-13 ENCOUNTER — Encounter: Payer: Self-pay | Admitting: Hematology

## 2023-12-14 ENCOUNTER — Ambulatory Visit (HOSPITAL_COMMUNITY)
Admission: RE | Admit: 2023-12-14 | Discharge: 2023-12-14 | Disposition: A | Discharge status: Home / Self Care | Source: Ambulatory Visit | Attending: Hematology | Admitting: Hematology

## 2023-12-14 ENCOUNTER — Telehealth: Payer: Self-pay

## 2023-12-14 ENCOUNTER — Inpatient Hospital Stay

## 2023-12-14 DIAGNOSIS — Z170 Estrogen receptor positive status [ER+]: Secondary | ICD-10-CM | POA: Insufficient documentation

## 2023-12-14 DIAGNOSIS — C50211 Malignant neoplasm of upper-inner quadrant of right female breast: Secondary | ICD-10-CM

## 2023-12-14 DIAGNOSIS — Z853 Personal history of malignant neoplasm of breast: Secondary | ICD-10-CM | POA: Diagnosis not present

## 2023-12-14 LAB — CBC WITH DIFFERENTIAL (CANCER CENTER ONLY)
Abs Immature Granulocytes: 0.01 10*3/uL (ref 0.00–0.07)
Basophils Absolute: 0 10*3/uL (ref 0.0–0.1)
Basophils Relative: 0 %
Eosinophils Absolute: 0.1 10*3/uL (ref 0.0–0.5)
Eosinophils Relative: 2 %
HCT: 36.6 % (ref 36.0–46.0)
Hemoglobin: 13.7 g/dL (ref 12.0–15.0)
Immature Granulocytes: 0 %
Lymphocytes Relative: 44 %
Lymphs Abs: 2 10*3/uL (ref 0.7–4.0)
MCH: 32 pg (ref 26.0–34.0)
MCHC: 37.4 g/dL — ABNORMAL HIGH (ref 30.0–36.0)
MCV: 85.5 fL (ref 80.0–100.0)
Monocytes Absolute: 0.3 10*3/uL (ref 0.1–1.0)
Monocytes Relative: 7 %
Neutro Abs: 2.1 10*3/uL (ref 1.7–7.7)
Neutrophils Relative %: 47 %
Platelet Count: 196 10*3/uL (ref 150–400)
RBC: 4.28 MIL/uL (ref 3.87–5.11)
RDW: 12 % (ref 11.5–15.5)
WBC Count: 4.5 10*3/uL (ref 4.0–10.5)
nRBC: 0 % (ref 0.0–0.2)

## 2023-12-14 LAB — CMP (CANCER CENTER ONLY)
ALT: 10 U/L (ref 0–44)
AST: 17 U/L (ref 15–41)
Albumin: 4.7 g/dL (ref 3.5–5.0)
Alkaline Phosphatase: 79 U/L (ref 38–126)
Anion gap: 7 (ref 5–15)
BUN: 11 mg/dL (ref 6–20)
CO2: 28 mmol/L (ref 22–32)
Calcium: 9.4 mg/dL (ref 8.9–10.3)
Chloride: 107 mmol/L (ref 98–111)
Creatinine: 0.6 mg/dL (ref 0.44–1.00)
GFR, Estimated: >60 mL/min (ref >60)
Glucose, Bld: 90 mg/dL (ref 70–99)
Potassium: 3.6 mmol/L (ref 3.5–5.1)
Sodium: 142 mmol/L (ref 135–145)
Total Bilirubin: 0.6 mg/dL (ref 0.0–1.2)
Total Protein: 7.4 g/dL (ref 6.5–8.1)

## 2023-12-14 NOTE — Telephone Encounter (Signed)
 Pt called regarding her MyChart message sent to Dr. Candise Chambers office.  Pt would like to speak with one of the providers before her Ultrasound scheduled for today at 1500.  Notified Dr. Maryalice Smaller and her team of the pt's call regarding her MyChart message.

## 2023-12-21 ENCOUNTER — Ambulatory Visit: Payer: Self-pay | Admitting: Hematology

## 2023-12-22 ENCOUNTER — Telehealth: Payer: Self-pay

## 2023-12-22 ENCOUNTER — Other Ambulatory Visit: Payer: Self-pay

## 2023-12-22 ENCOUNTER — Encounter: Payer: Self-pay | Admitting: Hematology

## 2023-12-22 NOTE — Telephone Encounter (Addendum)
 Contacted patient via telephone call, per Dr. Maryalice Smaller.  Let patient know provider's comments from below. Patient verbalized understanding, agreed to plan, and did not have any further concerns or questions.   ----- Message from Sonja Blaine sent at 12/21/2023 11:14 PM EDT ----- Please let pt know her US  neck results. It showed a normal appearing lymph node. If Guardant Reveal is negative (result should be back this week) then no additional work up is needed. Let me know when her GR result is back, thx  Sonja Los Ybanez

## 2023-12-22 NOTE — Telephone Encounter (Signed)
 Contacted patient via telephone call.  Let patient know her Guardant Reveal result was negative.  Patient voiced understanding and did not have any further concerns or questions.

## 2023-12-25 ENCOUNTER — Encounter: Payer: Self-pay | Admitting: Hematology

## 2023-12-29 LAB — GUARDANT REVEAL

## 2024-01-07 ENCOUNTER — Other Ambulatory Visit: Payer: Self-pay | Admitting: Nurse Practitioner

## 2024-01-07 DIAGNOSIS — F419 Anxiety disorder, unspecified: Secondary | ICD-10-CM

## 2024-01-08 ENCOUNTER — Encounter: Payer: Self-pay | Admitting: Hematology

## 2024-02-18 ENCOUNTER — Other Ambulatory Visit: Payer: Medicaid Other

## 2024-02-18 ENCOUNTER — Ambulatory Visit: Payer: Medicaid Other | Admitting: Nurse Practitioner

## 2024-03-10 ENCOUNTER — Encounter: Payer: Self-pay | Admitting: Physician Assistant

## 2024-03-10 ENCOUNTER — Ambulatory Visit: Attending: Physician Assistant | Admitting: Physician Assistant

## 2024-03-10 ENCOUNTER — Other Ambulatory Visit: Payer: Self-pay

## 2024-03-10 VITALS — BP 158/83 | HR 65 | Resp 14 | Ht 64.0 in | Wt 169.0 lb

## 2024-03-10 DIAGNOSIS — M47812 Spondylosis without myelopathy or radiculopathy, cervical region: Secondary | ICD-10-CM

## 2024-03-10 DIAGNOSIS — M19041 Primary osteoarthritis, right hand: Secondary | ICD-10-CM

## 2024-03-10 DIAGNOSIS — Z9011 Acquired absence of right breast and nipple: Secondary | ICD-10-CM

## 2024-03-10 DIAGNOSIS — M2241 Chondromalacia patellae, right knee: Secondary | ICD-10-CM

## 2024-03-10 DIAGNOSIS — Z8042 Family history of malignant neoplasm of prostate: Secondary | ICD-10-CM

## 2024-03-10 DIAGNOSIS — Z800 Family history of malignant neoplasm of digestive organs: Secondary | ICD-10-CM

## 2024-03-10 DIAGNOSIS — M19042 Primary osteoarthritis, left hand: Secondary | ICD-10-CM

## 2024-03-10 DIAGNOSIS — M810 Age-related osteoporosis without current pathological fracture: Secondary | ICD-10-CM

## 2024-03-10 DIAGNOSIS — Z808 Family history of malignant neoplasm of other organs or systems: Secondary | ICD-10-CM

## 2024-03-10 DIAGNOSIS — M359 Systemic involvement of connective tissue, unspecified: Secondary | ICD-10-CM

## 2024-03-10 DIAGNOSIS — M25511 Pain in right shoulder: Secondary | ICD-10-CM | POA: Diagnosis not present

## 2024-03-10 DIAGNOSIS — M51369 Other intervertebral disc degeneration, lumbar region without mention of lumbar back pain or lower extremity pain: Secondary | ICD-10-CM

## 2024-03-10 DIAGNOSIS — G8929 Other chronic pain: Secondary | ICD-10-CM

## 2024-03-10 DIAGNOSIS — M722 Plantar fascial fibromatosis: Secondary | ICD-10-CM

## 2024-03-10 DIAGNOSIS — Z170 Estrogen receptor positive status [ER+]: Secondary | ICD-10-CM

## 2024-03-10 DIAGNOSIS — Z79899 Other long term (current) drug therapy: Secondary | ICD-10-CM | POA: Diagnosis not present

## 2024-03-10 DIAGNOSIS — M25512 Pain in left shoulder: Secondary | ICD-10-CM

## 2024-03-10 DIAGNOSIS — C50211 Malignant neoplasm of upper-inner quadrant of right female breast: Secondary | ICD-10-CM

## 2024-03-10 DIAGNOSIS — M4156 Other secondary scoliosis, lumbar region: Secondary | ICD-10-CM

## 2024-03-10 DIAGNOSIS — C44519 Basal cell carcinoma of skin of other part of trunk: Secondary | ICD-10-CM

## 2024-03-10 NOTE — Telephone Encounter (Signed)
 Per [place], Patient to start methotrexate  tablets pending lab results.

## 2024-03-10 NOTE — Patient Instructions (Signed)
 Standing Labs We placed an order today for your standing lab work.   Please have your standing labs drawn in 1 month after starting the medication and then every 3 months.   Please have your labs drawn 2 weeks prior to your appointment so that the provider can discuss your lab results at your appointment, if possible.  Please note that you may see your imaging and lab results in MyChart before we have reviewed them. We will contact you once all results are reviewed. Please allow our office up to 72 hours to thoroughly review all of the results before contacting the office for clarification of your results.  WALK-IN LAB HOURS  Monday through Thursday from 8:00 am -12:30 pm and 1:00 pm-4:30 pm and Friday from 8:00 am-12:00 pm.  Patients with office visits requiring labs will be seen before walk-in labs.  You may encounter longer than normal wait times. Please allow additional time. Wait times may be shorter on  Monday and Thursday afternoons.  We do not book appointments for walk-in labs. We appreciate your patience and understanding with our staff.   Labs are drawn by [facility]. Please bring your co-pay at the time of your lab draw.  You may receive a bill from [facility] for your lab work.  Please note if you are on Hydroxychloroquine  and and an order has been placed for a Hydroxychloroquine  level,  you will need to have it drawn 4 hours or more after your last dose.  If you wish to have your labs drawn at another location, please call the office 24 hours in advance so we can fax the orders.  The office is located at [address], [place], [place] [address]   If you have any questions regarding directions or hours of operation,  please call [phone].   As a reminder, please drink plenty of water prior to coming for your lab work. Thanks!     Methotrexate  Tablets What is this medication? METHOTREXATE  (METH oh TREX ate) treats autoimmune conditions, such as arthritis and  psoriasis. It works by decreasing inflammation, which can reduce pain and prevent long-term injury to the joints and skin. It may also be used to treat some types of cancer. It works by slowing down the growth of cancer cells. This medicine may be used for other purposes; ask your health care provider or pharmacist if you have questions. COMMON BRAND NAME(S): Rheumatrex, Trexall  What should I tell my care team before I take this medication? They need to know if you have any of these conditions: Dehydration Diabetes Fluid in the stomach area or lungs Frequently drink alcohol Having surgery, including dental surgery High cholesterol Immune system problems Inflammatory bowel disease, such as ulcerative colitis Kidney disease Liver disease Low blood cell levels (white cells, red cells, and platelets) Lung disease Recent or ongoing radiation Recent or upcoming vaccine Stomach ulcers, other stomach or intestine problems An unusual or allergic reaction to methotrexate , other medications, foods, dyes, or preservatives Pregnant or trying to get pregnant Breastfeeding How should I use this medication? Take this medication by mouth with water. Take it as directed on the prescription label. Do not take extra. Keep taking this medication until your care team tells you to stop. Know why you are taking this medication and how you should take it. To treat conditions such as arthritis and psoriasis, this medication is taken ONCE A WEEK as a single dose or divided into 3 smaller doses taken 12 hours apart (do not  take more than 3 doses 12 hours apart each week). This medication is NEVER taken daily to treat conditions other than cancer. Taking this medication more often than directed can cause serious side effects, even death. Talk to your care team about why you are taking this medication, how often you will take it, and what your dose is. Ask your care team to put the reason you take this medication on the  prescription. If you take this medication ONCE A WEEK, choose a day of the week before you start. Ask your pharmacist to include the day of the week on the label. Avoid Monday, which could be misread as Morning. Handling this medication may be harmful. Talk to your care team about how to handle this medication. Special instructions may apply. Talk to your care team about the use of this medication in children. While it may be prescribed for selected conditions, precautions do apply. Overdosage: If you think you have taken too much of this medicine contact a poison control center or emergency room at once. NOTE: This medicine is only for you. Do not share this medicine with others. What if I miss a dose? If you miss a dose, talk with your care team. Do not take double or extra doses. What may interact with this medication? Do not take this medication with any of the following: Acitretin Live virus vaccines Probenecid This medication may also interact with the following: Alcohol Aspirin  and aspirin -like medications Certain antibiotics, such as penicillin, neomycin, sulfamethoxazole ; trimethoprim  Certain medications for stomach problems, such as lansoprazole, omeprazole , pantoprazole  Clozapine Cyclosporine Dapsone Folic acid  Foscarnet NSAIDs, medications for pain and inflammation, such as ibuprofen  or naproxen  Phenytoin Pyrimethamine Steroid medications, such as prednisone  or cortisone Tacrolimus Theophylline This list may not describe all possible interactions. Give your health care provider a list of all the medicines, herbs, non-prescription drugs, or dietary supplements you use. Also tell them if you smoke, drink alcohol, or use illegal drugs. Some items may interact with your medicine. What should I watch for while using this medication? Visit your care team for regular checks on your progress. It may be some time before you see the benefit from this medication. You may need  blood work done while you are taking this medication. If your care team has also prescribed folic acid , they may instruct you to skip your folic acid  dose on the day you take methotrexate . This medication can make you more sensitive to the sun. Keep out of the sun. If you cannot avoid being in the sun, wear protective clothing and sunscreen. Do not use sun lamps, tanning beds, or tanning booths. Check with your care team if you have severe diarrhea, nausea, and vomiting, or if you sweat a lot. The loss of too much body fluid may make it dangerous for you to take this medication. This medication may increase your risk of getting an infection. Call your care team for advice if you get a fever, chills, sore throat, or other symptoms of a cold or flu. Do not treat yourself. Try to avoid being around people who are sick. Talk to your care team about your risk of cancer. You may be more at risk for certain types of cancers if you take this medication. Talk to your care team if you or your partner may be pregnant. Serious birth defects can occur if you take this medication during pregnancy and for 6 months after the last dose. You will need a negative pregnancy test before starting  this medication. Contraception is recommended while taking this medication and for 6 months after the last dose. Your care team can help you find the option that works for you. If your partner can get pregnant, use a condom during sex while taking this medication and for 3 months after the last dose. Do not breastfeed while taking this medication and for 1 week after the last dose. This medication may cause infertility. Talk to your care team if you are concerned about your fertility. What side effects may I notice from receiving this medication? Side effects that you should report to your care team as soon as possible: Allergic reactions--skin rash, itching, hives, swelling of the face, lips, tongue, or throat Dry cough, shortness  of breath or trouble breathing Infection--fever, chills, cough, sore throat, wounds that don't heal, pain or trouble when passing urine, general feeling of discomfort or being unwell Kidney injury--decrease in the amount of urine, swelling of the ankles, hands, or feet Liver injury--right upper belly pain, loss of appetite, nausea, light-colored stool, dark yellow or brown urine, yellowing skin or eyes, unusual weakness or fatigue Low red blood cell level--unusual weakness or fatigue, dizziness, headache, trouble breathing Pain, tingling, or numbness in the hands or feet, muscle weakness, change in vision, confusion or trouble speaking, loss of balance or coordination, trouble walking, seizures Redness, blistering, peeling, or loosening of the skin, including inside the mouth Stomach bleeding--bloody or black, tar-like stools, vomiting blood or brown material that looks like coffee grounds Stomach pain that is severe, does not go away, or gets worse Unusual bruising or bleeding Side effects that usually do not require medical attention (report these to your care team if they continue or are bothersome): Diarrhea Dizziness Hair loss Nausea Pain, redness, or swelling with sores inside the mouth or throat Skin reactions on sun-exposed areas Vomiting This list may not describe all possible side effects. Call your doctor for medical advice about side effects. You may report side effects to FDA at 1-800-FDA-1088. Where should I keep my medication? Keep out of the reach of children and pets. Store at room temperature between 20 and 25 degrees C (68 and 77 degrees F). Protect from light. Keep the container tightly closed. Get rid of any unused medication after the expiration date. To get rid of medications that are no longer needed or have expired: Take the medication to a medication take-back program. Check with your pharmacy or law enforcement to find a location. If you cannot return the  medication, ask your pharmacist or care team how to get rid of this medication safely. NOTE: This sheet is a summary. It may not cover all possible information. If you have questions about this medicine, talk to your doctor, pharmacist, or health care provider.  2024 [employer] Standard (2023-06-26 00:00:00)

## 2024-03-10 NOTE — Progress Notes (Signed)
 Office Visit Note  Patient: Claire Guzman             Date of Birth: 08/25/1965           MRN: [id number]             PCP: Adine Duwaine Credit, MD Referring: Adine Duwaine Credit, MD Visit Date: 03/10/2024 Occupation: @GUAROCC @  Subjective:  Flare   History of Present Illness: Claire Guzman is a 58 y.o. female with history of undifferentiated connective tissue disease.  Patient is been taking Plaquenil  200 mg 1 tablet by mouth twice daily.  Patient states that since June she has been taking Plaquenil  twice daily every day instead of Monday through Friday due to experiencing new symptoms.  Patient states that she has had increased fatigue, photosensitivity, intermittent rashes, and increased arthralgias.  Patient states that this morning she woke up with a burning aching pain in the left knee.  Patient denies any change in activity prior to the onset of symptoms.  She has been taking Advil  as needed for symptomatic relief.  Patient states she is also been experiencing increased pain and stiffness in the PIP joints of both hands.    Component     Latest Ref Rng 12/08/2023  Creatinine, Urine     20 - 275 mg/dL 22   Protein/Creat Ratio     24 - 184 mg/g creat NOTE   Protein/Creatinine Ratio     0.024 - 0.184 mg/mg creat NOTE   Total Protein, Urine     5 - 24 mg/dL <4 (L)   C3 Complement     83 - 193 mg/dL 852   C4 Complement     15 - 57 mg/dL 17   ds DNA Ab     IU/mL 105 (H)   Sed Rate     0 - 30 mm/h 6     Activities of Daily Living:  Patient reports morning stiffness for 30-40 minutes.   Patient Reports nocturnal pain.  Difficulty dressing/grooming: Denies Difficulty climbing stairs: Denies Difficulty getting out of chair: Denies Difficulty using hands for taps, buttons, cutlery, and/or writing: Denies  Review of Systems  Constitutional:  Positive for fatigue.  HENT:  Positive for mouth dryness. Negative for mouth sores.   Eyes:  Positive for dryness.   Respiratory:  Negative for shortness of breath.   Cardiovascular:  Negative for chest pain and palpitations.  Gastrointestinal:  Negative for blood in stool, constipation and diarrhea.  Endocrine: Positive for increased urination.  Genitourinary:  Negative for involuntary urination.  Musculoskeletal:  Positive for joint pain, joint pain, joint swelling, myalgias, muscle weakness, morning stiffness and myalgias. Negative for gait problem and muscle tenderness.  Skin:  Positive for rash, hair loss and sensitivity to sunlight. Negative for color change.  Allergic/Immunologic: Negative for susceptible to infections.  Neurological:  Positive for dizziness. Negative for headaches.  Hematological:  Negative for swollen glands.  Psychiatric/Behavioral:  Positive for depressed mood. Negative for sleep disturbance. The patient is nervous/anxious.     PMFS History:  Patient Active Problem List   Diagnosis Date Noted   Osteoporosis 11/30/2023   Elevated troponin 11/13/2022   Essential hypertension 11/13/2022   GERD (gastroesophageal reflux disease) 11/13/2022   GAD (generalized anxiety disorder) 11/13/2022   Chest pain 11/12/2022   Plantar fasciitis 12/12/2021   Heel spur, left 12/12/2021   Headache, acute 08/08/2021   Low back pain 05/14/2021   Acquired absence of right breast 09/22/2018   Port-A-Cath  in place 05/11/2018   Genetic testing 04/12/2018   Family history of prostate cancer    Family history of colon cancer    Family history of melanoma    Malignant neoplasm of upper-inner quadrant of right breast in female, estrogen receptor positive (HCC) 03/10/2018    Past Medical History:  Diagnosis Date   Abdominal pain, unspecified site    Anxiety state, unspecified    Complication of anesthesia    convulsions - when she has appendectomy about 12 years ago at [facility]   Contact dermatitis and other eczema, due to unspecified cause    Essential hypertension, benign    Family  history of colon cancer    Family history of melanoma    Family history of prostate cancer    GERD (gastroesophageal reflux disease)    Hematuria, unspecified    High cholesterol    Hypertension    Lower back pain    Mixed hyperlipidemia    Personal history of chemotherapy    Personal history of radiation therapy    Rosacea    rt breast ca dx'd 01/2018   R breast cancer   Unspecified symptom associated with female genital organs     Family History  Problem Relation Age of Onset   Heart failure Mother    Melanoma Mother 76       has had several removed over the past ten years   Atrial fibrillation Mother    Prostate cancer Father 66   Stroke Father    Throat cancer Father    Healthy Daughter    Heart Problems Maternal Aunt        d.60   Heart Problems Maternal Uncle        d.60   Colon cancer Paternal Aunt        dx 40s   Colon cancer Paternal Uncle        dx 93s   Heart attack Maternal Grandmother    Heart attack Maternal Grandfather        possible cancer, unknown type   Stroke Paternal Grandmother    Colon cancer Paternal Grandfather        dx upper 27s   Breast cancer Neg Hx    BRCA 1/2 Neg Hx    Past Surgical History:  Procedure Laterality Date   BREAST RECONSTRUCTION WITH PLACEMENT OF TISSUE EXPANDER AND ALLODERM Right 09/14/2018   Procedure: BREAST RECONSTRUCTION WITH PLACEMENT OF TISSUE EXPANDER AND ALLODERM;  Surgeon: Arelia Filippo, MD;  Location: [facility] OR;  Service: Plastics;  Laterality: Right;   CESAREAN SECTION     DIAGNOSTIC LAPAROSCOPY     EYE SURGERY     Lasik   LAPAROSCOPIC APPENDECTOMY     LEFT HEART CATH AND CORONARY ANGIOGRAPHY N/A 11/13/2022   Procedure: LEFT HEART CATH AND CORONARY ANGIOGRAPHY;  Surgeon: Verlin Lonni BIRCH, MD;  Location: [facility];  Service: Cardiovascular;  Laterality: N/A;   LIPOSUCTION WITH LIPOFILLING Right 07/26/2019   Procedure: LIPOFILLING from abdomen to R chest;  Surgeon: Arelia Filippo, MD;   Location: [facility];  Service: Plastics;  Laterality: Right;   MASTECTOMY Right    2019   MASTECTOMY WITH RADIOACTIVE SEED GUIDED EXCISION AND AXILLARY SENTINEL LYMPH NODE BIOPSY Right 09/14/2018   Procedure: RIGHT NIPPLE SPARING MASTECTOMY WITH TARGETED DEEP RIGHT AXILLARY LYMPH NODE BIOPSY WITH RADIOACTIVE SEED LOCALIZATION AND RIGHT AXILLARY SENTINEL LYMPH NODE BIOPSY, FROZEN SECTION AND POSSIBLE COMPLETION AXILLARY LYMPH NODE DISSECTION INJECT BLUE DYE RIGHT BREAST;  Surgeon: Gail Favorite, MD;  Location: [facility] OR;  Service: General;  Laterality: Right;   PLANTAR FASCIA SURGERY Left 04/17/2022   PORT-A-CATH REMOVAL Left 07/26/2019   Procedure: REMOVAL PORT-A-CATH;  Surgeon: Arelia Filippo, MD;  Location: [facility];  Service: Plastics;  Laterality: Left;   PORTACATH PLACEMENT Left 04/13/2018   Procedure: INSERTION PORT-A-CATH WITH US ;  Surgeon: Gail Favorite, MD;  Location: [facility];  Service: General;  Laterality: Left;   REMOVAL OF TISSUE EXPANDER AND PLACEMENT OF IMPLANT Right 07/26/2019   Procedure: REMOVAL OF TISSUE EXPANDER AND PLACEMENT OF IMPLANT;  Surgeon: Arelia Filippo, MD;  Location: [facility];  Service: Plastics;  Laterality: Right;   UTERINE FIBROID SURGERY     Social History   Social History Narrative   Not on file   Immunization History  Administered Date(s) Administered   Influenza,inj,Quad PF,6+ Mos 05/11/2018, 05/16/2019, 05/04/2020   Moderna Sars-Covid-2 Vaccination 10/13/2019, 11/10/2019, 08/14/2020   Tdap 02/06/2022     Objective: Vital Signs: BP (!) 158/83 (BP Location: Left Arm, Patient Position: Sitting, Cuff Size: Normal)   Pulse 65   Resp 14   Ht 5' 4 (1.626 m)   Wt 169 lb (76.7 kg)   LMP 03/28/2018 (Within Days)   BMI 29.01 kg/m    Physical Exam Vitals and nursing note reviewed.  Constitutional:      Appearance: She is well-developed.  HENT:     Head: Normocephalic and  atraumatic.  Eyes:     Conjunctiva/sclera: Conjunctivae normal.  Cardiovascular:     Rate and Rhythm: Normal rate and regular rhythm.     Heart sounds: Normal heart sounds.  Pulmonary:     Effort: Pulmonary effort is normal.     Breath sounds: Normal breath sounds.  Abdominal:     General: Bowel sounds are normal.     Palpations: Abdomen is soft.  Musculoskeletal:     Cervical back: Normal range of motion.  Lymphadenopathy:     Cervical: No cervical adenopathy.  Skin:    General: Skin is warm and dry.     Capillary Refill: Capillary refill takes less than 2 seconds.  Neurological:     Mental Status: She is alert and oriented to person, place, and time.  Psychiatric:        Behavior: Behavior normal.      Musculoskeletal Exam: C-spine has limited range of motion without rotation.  Shoulder joints, elbow joints, wrist joints, MCPs, PIPs, DIPs have good range of motion with no synovitis.  Tenderness of PIP joints.  Complete fist formation bilaterally.  Hip joints have good range of motion with no groin pain.  Knee joints have good range of motion no warmth or effusion.  Discomfort with range of motion of the left knee. Ankle joints have good ROM with no tenderness or joint swelling.  CDAI Exam: CDAI Score: -- Patient Global: --; Provider Global: -- Swollen: --; Tender: -- Joint Exam 03/10/2024   No joint exam has been documented for this visit   There is currently no information documented on the homunculus. Go to the Rheumatology activity and complete the homunculus joint exam.  Investigation: No additional findings.  Imaging: No results found.  Recent Labs: Lab Results  Component Value Date   WBC 4.5 12/14/2023   HGB 13.7 12/14/2023   PLT 196 12/14/2023   NA 142 12/14/2023   K 3.6 12/14/2023   CL 107 12/14/2023   CO2 28 12/14/2023   GLUCOSE 90 12/14/2023  BUN 11 12/14/2023   CREATININE 0.60 12/14/2023   BILITOT 0.6 12/14/2023   ALKPHOS 79 12/14/2023   AST 17  12/14/2023   ALT 10 12/14/2023   PROT 7.4 12/14/2023   ALBUMIN 4.7 12/14/2023   CALCIUM  9.4 12/14/2023   GFRAA >60 03/02/2020   QFTBGOLDPLUS NEGATIVE 02/03/2023    Speciality Comments: PLQ Eye Exam: 07/30/2022 WNL @ [facility] Follow up 1 year.   Patient to call and schedule an exam and call us  back with the date.  Procedures:  No procedures performed Allergies: Trazodone  and nefazodone, Metronidazole, Other, Paxil [paroxetine hcl], Sulfamethoxazole , Zolmitriptan, and Prednisone    Assessment / Plan:     Visit Diagnoses: Undifferentiated connective tissue disease (HCC) - Positive ANA, positive double-stranded DNA, dry mouth, polyarthralgia: -Repeat double-stranded DNA was negative: Patient presents today with several new and worsening concerns.  She has been having increased fatigue, arthralgias, joint stiffness, photosensitivity, and intermittent rashes.  She is been taking Plaquenil  200 mg 1 tablet twice daily every day-she increased the dose of Plaquenil  from twice daily Monday through Friday to twice daily every day since June 2025 due to the increase of symptoms.  Lab work from 12/08/2023 was reviewed today in the office: ESR 6, double-stranded DNA 105, complements within normal limits, and no proteinuria.  Plan to obtain the following lab work today for further evaluation. She presents today to discuss treatment alternatives.  She does not tolerate prednisone .  Discussed the option of reinitiating methotrexate  versus trying Imuran.  Reviewed indications, contraindications, potential side effects of methotrexate  today in detail.  All questions were addressed and consent was updated.  Patient previously took methotrexate  but discontinued due to the concern for immunosuppression.  Plan to reinitiate a reduced dose of methotrexate  4 tablets by mouth once weekly x 2 weeks and if labs are stable she will increase to 6 tablets weekly.  She will remain on Plaquenil  as combination therapy.   She was advised to notify us  if she develops any new or worsening symptoms.  She will follow-up in the office in 8 weeks or sooner if needed.  Plan: CBC with Differential/Platelet, Protein / creatinine ratio, urine, ANA, Anti-DNA antibody, double-stranded, Sedimentation rate, C3 and C4, Comprehensive metabolic panel with GFR, Thiopurine methyltransferase(tpmt)rbc, C-reactive protein  Drug Counseling TB Gold: TB Gold negative on 02/03/2023 Hepatitis panel: Hepatitis B and C- on 02/03/2023.  Chest-xray: Chest x-ray negative on 11/12/2022.  Alcohol use: Discussed the importance of avoiding alcohol use.  Patient was counseled on the purpose, proper use, and adverse effects of methotrexate  including nausea, infection, and signs and symptoms of pneumonitis.  Reviewed instructions with patient to take methotrexate  weekly along with folic acid  daily.  Discussed the importance of frequent monitoring of kidney and liver function and blood counts, and provided patient with standing lab instructions.  Counseled patient to avoid NSAIDs and alcohol while on methotrexate .  Provided patient with educational materials on methotrexate  and answered all questions.  Advised patient to get annual influenza vaccine and to get a pneumococcal vaccine if patient has not already had one.  Patient voiced understanding.  Patient consented to methotrexate  use.  Will upload into chart.    High risk medication use -Plan to add methotrexate  4 tablets by mouth once weekly x 2 weeks and if labs are stable she will increase to 6 tablets weekly.  She will take folic acid  1 mg daily.   Plaquenil  200 mg 1 tablet by mouth twice daily Monday through Friday.  Plaquenil  was initiated  in November 2023. PLQ Eye Exam: 07/30/2022.  TPMT updated today. CBC and CMP were updated today.  She will require updated lab work in 2 weeks x 2, 2 months, then every 3 months.  Standing orders for CBC and CMP were placed today. No recent or recurrent infections.   Discussed the importance of holding methotrexate  if she develops signs or symptoms of infection and to resume once the infection has completely cleared. - Plan: CBC with Differential/Platelet, Comprehensive metabolic panel with GFR, Thiopurine methyltransferase(tpmt)rbc  Chronic pain of both shoulders: Intermittent discomfort and stiffness.  Primary osteoarthritis of both hands: Patient presents today with increased pain and stiffness in both hands especially in the PIP joints.  Chondromalacia patellae, right knee: No warmth or effusion.  Plantar fasciitis - S/p surgical intervention for Plantar fasciitis by Dr. Gershon on April 17, 2022.  Surgical scar is healed.  Arthropathy of cervical facet joint: Intermittent discomfort and stiffness.  Degeneration of intervertebral disc of lumbar region without discogenic back pain or lower extremity pain: Intermittent discomfort.  Other secondary scoliosis, lumbar region  Age-related osteoporosis without current pathological fracture - On Zometa  infusions by her oncologist.  Other medical conditions are listed as follows:  Malignant neoplasm of upper-inner quadrant of right breast in female, estrogen receptor positive (HCC)  Acquired absence of right breast  Basal cell carcinoma (BCC) of skin of other part of torso  Family history of melanoma-mother  Family history of colon cancer-paternal grandfather, paternal aunt, paternal uncle  Family history of prostate cancer-father  Orders: Orders Placed This Encounter  Procedures   CBC with Differential/Platelet   Protein / creatinine ratio, urine   ANA   Anti-DNA antibody, double-stranded   Sedimentation rate   C3 and C4   Comprehensive metabolic panel with GFR   Thiopurine methyltransferase(tpmt)rbc   C-reactive protein   CBC with Differential/Platelet   Comprehensive metabolic panel with GFR   No orders of the defined types were placed in this encounter.    Follow-Up  Instructions: Return in about 8 weeks (around 05/05/2024) for UCTD .   Waddell CHRISTELLA Craze, PA-C  Note - This record has been created using Dragon software.  Chart creation errors have been sought, but may not always  have been located. Such creation errors do not reflect on  the standard of medical care.

## 2024-03-11 ENCOUNTER — Ambulatory Visit: Payer: Self-pay | Admitting: Physician Assistant

## 2024-03-11 NOTE — Progress Notes (Signed)
 Patient can be evaluated at urgent care or the ED.

## 2024-03-11 NOTE — Progress Notes (Signed)
 CBC and CMP WNL ESR WNL Urine protein creatinine ratio WNL

## 2024-03-14 MED ORDER — FOLIC ACID 1 MG PO TABS: 1.0000 mg | ORAL_TABLET | Freq: Every day | ORAL | 3 refills | Status: AC

## 2024-03-14 MED ORDER — METHOTREXATE SODIUM 2.5 MG PO TABS
ORAL_TABLET | ORAL | 0 refills | Status: DC
Start: 2024-03-14 — End: 2024-05-10

## 2024-03-14 NOTE — Addendum Note (Signed)
 Addended by: CENA ALFONSO CROME on: 03/14/2024 11:06 AM   Modules accepted: Orders

## 2024-03-14 NOTE — Telephone Encounter (Signed)
 CBC and CMP WNL ESR WNL Urine protein creatinine ratio WNL. dsDNA remains positive but has improved slightly.   ANA remains positive-titer unchanged.   CRP WNL Complements WNL  Plan to reinitiate a reduced dose of methotrexate  4 tablets by mouth once weekly x 2 weeks and if labs are stable she will increase to 6 tablets weekly. She will take folic acid  1 mg daily.

## 2024-03-14 NOTE — Progress Notes (Signed)
 dsDNA remains positive but has improved slightly.   ANA remains positive-titer unchanged.   CRP WNL Complements WNL

## 2024-03-17 LAB — COMPREHENSIVE METABOLIC PANEL WITH GFR
AG Ratio: 1.9 (calc) (ref 1.0–2.5)
ALT: 10 U/L (ref 6–29)
AST: 17 U/L (ref 10–35)
Albumin: 4.6 g/dL (ref 3.6–5.1)
Alkaline phosphatase (APISO): 80 U/L (ref 37–153)
BUN: 13 mg/dL (ref 7–25)
CO2: 29 mmol/L (ref 20–32)
Calcium: 9.8 mg/dL (ref 8.6–10.4)
Chloride: 105 mmol/L (ref 98–110)
Creat: 0.5 mg/dL (ref 0.50–1.03)
Globulin: 2.4 g/dL (ref 1.9–3.7)
Glucose, Bld: 90 mg/dL (ref 65–99)
Potassium: 3.8 mmol/L (ref 3.5–5.3)
Sodium: 142 mmol/L (ref 135–146)
Total Bilirubin: 0.4 mg/dL (ref 0.2–1.2)
Total Protein: 7 g/dL (ref 6.1–8.1)
eGFR: 110 mL/min/1.73m2 (ref >=60)

## 2024-03-17 LAB — CBC WITH DIFFERENTIAL/PLATELET
Absolute Lymphocytes: 1894 {cells}/uL (ref 850–3900)
Absolute Monocytes: 390 {cells}/uL (ref 200–950)
Basophils Absolute: 28 {cells}/uL (ref 0–200)
Basophils Relative: 0.6 %
Eosinophils Absolute: 141 {cells}/uL (ref 15–500)
Eosinophils Relative: 3 %
HCT: 37.6 % (ref 35.0–45.0)
Hemoglobin: 12.9 g/dL (ref 11.7–15.5)
MCH: 31.6 pg (ref 27.0–33.0)
MCHC: 34.3 g/dL (ref 32.0–36.0)
MCV: 92.2 fL (ref 80.0–100.0)
MPV: 10.6 fL (ref 7.5–12.5)
Monocytes Relative: 8.3 %
Neutro Abs: 2247 {cells}/uL (ref 1500–7800)
Neutrophils Relative %: 47.8 %
Platelets: 196 Thousand/uL (ref 140–400)
RBC: 4.08 Million/uL (ref 3.80–5.10)
RDW: 12.5 % (ref 11.0–15.0)
Total Lymphocyte: 40.3 %
WBC: 4.7 Thousand/uL (ref 3.8–10.8)

## 2024-03-17 LAB — PROTEIN / CREATININE RATIO, URINE
Creatinine, Urine: 41 mg/dL (ref 20–275)
Protein/Creat Ratio: 146 mg/g{creat} (ref 24–184)
Protein/Creatinine Ratio: 0.146 mg/mg{creat} (ref 0.024–0.184)
Total Protein, Urine: 6 mg/dL (ref 5–24)

## 2024-03-17 LAB — THIOPURINE METHYLTRANSFERASE (TPMT), RBC: Thiopurine Methyltransferase, RBC: 15 nmol/h/mL

## 2024-03-17 LAB — C3 AND C4
C3 Complement: 147 mg/dL (ref 83–193)
C4 Complement: 18 mg/dL (ref 15–57)

## 2024-03-17 LAB — ANTI-DNA ANTIBODY, DOUBLE-STRANDED: ds DNA Ab: 97 [IU]/mL — ABNORMAL HIGH

## 2024-03-17 LAB — ANA: Anti Nuclear Antibody (ANA): POSITIVE — AB

## 2024-03-17 LAB — C-REACTIVE PROTEIN: CRP: <3 mg/L (ref <8.0)

## 2024-03-17 LAB — ANTI-NUCLEAR AB-TITER (ANA TITER): ANA Titer 1: 1:1280 {titer} — ABNORMAL HIGH

## 2024-03-17 LAB — SEDIMENTATION RATE: Sed Rate: 9 mm/h (ref 0–30)

## 2024-03-17 NOTE — Progress Notes (Signed)
 TPMT WNL--not planning to start imuran at this time.   Please clarify how she is doing since restarting methotrexate ?

## 2024-03-31 ENCOUNTER — Other Ambulatory Visit: Payer: Self-pay

## 2024-04-15 LAB — OPHTHALMOLOGY REPORT-SCANNED: A Comment: NORMAL

## 2024-04-25 NOTE — Progress Notes (Unsigned)
 Office Visit Note  Patient: Claire Guzman             Date of Birth: 1966-01-17           MRN: [id number]             PCP: Adine Duwaine Credit, MD Referring: Adine Duwaine Credit, MD Visit Date: 05/09/2024 Occupation: Data Unavailable  Subjective:  Eye pain   History of Present Illness: Claire Guzman is a 58 y.o. female with history of undifferentiated connective tissue disease. She remains on Plaquenil  200 mg 1 tablet by mouth twice daily.  She was started on methotrexate  after her last office visit and was taking 4 tablets by mouth once weekly.  Patient has not had an updated lab work due to loss of insurance.  Patient is currently self-pay but would like to have updated lab work in order to have a refill of methotrexate .  Patient states that she was tolerating methotrexate  without any side effects and finished the prescription last week.  Patient states that she was unable to notice any benefit after starting methotrexate  but is open to increasing the dose.  Patient states that since her last office visit she has been experiencing increased pain and redness in both eyes.  Patient was evaluated by [facility] and was told that she has inflammation in both eyes, which is due to underlying lupus.she is unsure of the exact diagnosis. She has been using a steroid drop daily but she continues to have intermittent symptoms.   She continues to have intermittent pain and stiffness in both hands and both knee joints.  Her knee joint pain is exacerbated by climbing steps.  She also has intermittent discomfort and stiffness in the cervical spine.    Activities of Daily Living:  Patient reports morning stiffness for 1 hour.   Patient Denies nocturnal pain.  Difficulty dressing/grooming: Denies Difficulty climbing stairs: Reports Difficulty getting out of chair: Denies Difficulty using hands for taps, buttons, cutlery, and/or writing: Denies  Review of Systems  Constitutional:   Positive for fatigue.  HENT:  Negative for mouth sores and mouth dryness.   Eyes:  Negative for dryness.  Respiratory:  Negative for shortness of breath.   Cardiovascular:  Positive for palpitations. Negative for chest pain.  Gastrointestinal:  Negative for blood in stool, constipation and diarrhea.  Endocrine: Negative for increased urination.  Genitourinary:  Negative for involuntary urination.  Musculoskeletal:  Positive for joint pain, joint pain, joint swelling, morning stiffness and muscle tenderness. Negative for gait problem, myalgias, muscle weakness and myalgias.  Skin:  Negative for color change, rash, hair loss and sensitivity to sunlight.  Allergic/Immunologic: Negative for susceptible to infections.  Neurological:  Positive for headaches. Negative for dizziness.  Hematological:  Negative for swollen glands.  Psychiatric/Behavioral:  Positive for depressed mood. Negative for sleep disturbance. The patient is nervous/anxious.     PMFS History:  Patient Active Problem List   Diagnosis Date Noted   Osteoporosis 11/30/2023   Elevated troponin 11/13/2022   Essential hypertension 11/13/2022   GERD (gastroesophageal reflux disease) 11/13/2022   GAD (generalized anxiety disorder) 11/13/2022   Chest pain 11/12/2022   Plantar fasciitis 12/12/2021   Heel spur, left 12/12/2021   Headache, acute 08/08/2021   Low back pain 05/14/2021   Acquired absence of right breast 09/22/2018   Port-A-Cath in place 05/11/2018   Genetic testing 04/12/2018   Family history of prostate cancer    Family history of colon cancer  Family history of melanoma    Malignant neoplasm of upper-inner quadrant of right breast in female, estrogen receptor positive (HCC) 03/10/2018    Past Medical History:  Diagnosis Date   Abdominal pain, unspecified site    Anxiety state, unspecified    Complication of anesthesia    convulsions - when she has appendectomy about 12 years ago at [facility]   Contact  dermatitis and other eczema, due to unspecified cause    Essential hypertension, benign    Family history of colon cancer    Family history of melanoma    Family history of prostate cancer    GERD (gastroesophageal reflux disease)    Hematuria, unspecified    High cholesterol    Hypertension    Lower back pain    Mixed hyperlipidemia    Personal history of chemotherapy    Personal history of radiation therapy    Rosacea    rt breast ca dx'd 01/2018   R breast cancer   Unspecified symptom associated with female genital organs     Family History  Problem Relation Age of Onset   Heart failure Mother    Melanoma Mother 80       has had several removed over the past ten years   Atrial fibrillation Mother    Prostate cancer Father 14   Stroke Father    Throat cancer Father    Healthy Daughter    Heart Problems Maternal Aunt        d.60   Heart Problems Maternal Uncle        d.60   Colon cancer Paternal Aunt        dx 8s   Colon cancer Paternal Uncle        dx 67s   Heart attack Maternal Grandmother    Heart attack Maternal Grandfather        possible cancer, unknown type   Stroke Paternal Grandmother    Colon cancer Paternal Grandfather        dx upper 88s   Breast cancer Neg Hx    BRCA 1/2 Neg Hx    Past Surgical History:  Procedure Laterality Date   BREAST RECONSTRUCTION WITH PLACEMENT OF TISSUE EXPANDER AND ALLODERM Right 09/14/2018   Procedure: BREAST RECONSTRUCTION WITH PLACEMENT OF TISSUE EXPANDER AND ALLODERM;  Surgeon: Arelia Filippo, MD;  Location: [facility] OR;  Service: Plastics;  Laterality: Right;   CESAREAN SECTION     DIAGNOSTIC LAPAROSCOPY     EYE SURGERY     Lasik   LAPAROSCOPIC APPENDECTOMY     LEFT HEART CATH AND CORONARY ANGIOGRAPHY N/A 11/13/2022   Procedure: LEFT HEART CATH AND CORONARY ANGIOGRAPHY;  Surgeon: Verlin Lonni BIRCH, MD;  Location: [facility];  Service: Cardiovascular;  Laterality: N/A;   LIPOSUCTION WITH LIPOFILLING Right  07/26/2019   Procedure: LIPOFILLING from abdomen to R chest;  Surgeon: Arelia Filippo, MD;  Location: [facility];  Service: Plastics;  Laterality: Right;   MASTECTOMY Right    2019   MASTECTOMY WITH RADIOACTIVE SEED GUIDED EXCISION AND AXILLARY SENTINEL LYMPH NODE BIOPSY Right 09/14/2018   Procedure: RIGHT NIPPLE SPARING MASTECTOMY WITH TARGETED DEEP RIGHT AXILLARY LYMPH NODE BIOPSY WITH RADIOACTIVE SEED LOCALIZATION AND RIGHT AXILLARY SENTINEL LYMPH NODE BIOPSY, FROZEN SECTION AND POSSIBLE COMPLETION AXILLARY LYMPH NODE DISSECTION INJECT BLUE DYE RIGHT BREAST;  Surgeon: Gail Favorite, MD;  Location: [facility];  Service: General;  Laterality: Right;   PLANTAR FASCIA SURGERY Left 04/17/2022   PORT-A-CATH REMOVAL  Left 07/26/2019   Procedure: REMOVAL PORT-A-CATH;  Surgeon: Arelia Filippo, MD;  Location: [facility];  Service: Plastics;  Laterality: Left;   PORTACATH PLACEMENT Left 04/13/2018   Procedure: INSERTION PORT-A-CATH WITH US ;  Surgeon: Gail Favorite, MD;  Location: [facility];  Service: General;  Laterality: Left;   REMOVAL OF TISSUE EXPANDER AND PLACEMENT OF IMPLANT Right 07/26/2019   Procedure: REMOVAL OF TISSUE EXPANDER AND PLACEMENT OF IMPLANT;  Surgeon: Arelia Filippo, MD;  Location:  SURGERY CENTER;  Service: Plastics;  Laterality: Right;   UTERINE FIBROID SURGERY     Social History   Tobacco Use   Smoking status: Never    Passive exposure: Never   Smokeless tobacco: Never  Vaping Use   Vaping status: Never Used  Substance Use Topics   Alcohol use: No   Drug use: Never   Social History   Social History Narrative   Not on file     Immunization History  Administered Date(s) Administered   Influenza,inj,Quad PF,6+ Mos 05/11/2018, 05/16/2019, 05/04/2020   Moderna Sars-Covid-2 Vaccination 10/13/2019, 11/10/2019, 08/14/2020   Tdap 02/06/2022     Objective: Vital Signs: BP 111/65   Pulse (!) 54   Temp (!)  96.8 F (36 C)   Resp 16   Ht 5' 4 (1.626 m)   Wt 168 lb 9.6 oz (76.5 kg)   LMP 03/28/2018 (Within Days)   BMI 28.94 kg/m    Physical Exam Vitals and nursing note reviewed.  Constitutional:      Appearance: She is well-developed.  HENT:     Head: Normocephalic and atraumatic.  Eyes:     Conjunctiva/sclera: Conjunctivae normal.  Cardiovascular:     Rate and Rhythm: Normal rate and regular rhythm.     Heart sounds: Normal heart sounds.  Pulmonary:     Effort: Pulmonary effort is normal.     Breath sounds: Normal breath sounds.  Abdominal:     General: Bowel sounds are normal.     Palpations: Abdomen is soft.  Musculoskeletal:     Cervical back: Normal range of motion.  Lymphadenopathy:     Cervical: No cervical adenopathy.  Skin:    General: Skin is warm and dry.     Capillary Refill: Capillary refill takes less than 2 seconds.  Neurological:     Mental Status: She is alert and oriented to person, place, and time.  Psychiatric:        Behavior: Behavior normal.      Musculoskeletal Exam: C-spine, thoracic spine, lumbar spine have good range of motion.  No midline spinal tenderness.  No SI joint tenderness.  Shoulder joints, elbow joints, wrist joints, MCPs, PIPs, DIPs have good range of motion with no synovitis.  Complete fist formation bilaterally.  Hip joints have good range of motion with no groin pain.  Knee joints have good range of motion no warmth or effusion.  Ankle joints have good range of motion no tenderness or joint swelling.  No evidence of Achilles tendinitis or plantar fasciitis.   CDAI Exam: CDAI Score: -- Patient Global: --; Provider Global: -- Swollen: --; Tender: -- Joint Exam 05/09/2024   No joint exam has been documented for this visit   There is currently no information documented on the homunculus. Go to the Rheumatology activity and complete the homunculus joint exam.  Investigation: No additional findings.  Imaging: No results  found.  Recent Labs: Lab Results  Component Value Date   WBC 4.7 03/10/2024  HGB 12.9 03/10/2024   PLT 196 03/10/2024   NA 142 03/10/2024   K 3.8 03/10/2024   CL 105 03/10/2024   CO2 29 03/10/2024   GLUCOSE 90 03/10/2024   BUN 13 03/10/2024   CREATININE 0.50 03/10/2024   BILITOT 0.4 03/10/2024   ALKPHOS 79 12/14/2023   AST 17 03/10/2024   ALT 10 03/10/2024   PROT 7.0 03/10/2024   ALBUMIN 4.7 12/14/2023   CALCIUM  9.8 03/10/2024   GFRAA >60 03/02/2020   QFTBGOLDPLUS NEGATIVE 02/03/2023    Speciality Comments: PLQ Eye Exam: 07/30/2022 WNL @ [facility] Follow up 1 year.   Patient to call and schedule an exam and call us  back with the date.  Procedures:  No procedures performed Allergies: Trazodone  and nefazodone, Metronidazole, Other, Paxil [paroxetine hcl], Sulfamethoxazole , Zolmitriptan, and Prednisone      Assessment / Plan:     Visit Diagnoses: Undifferentiated connective tissue disease - Positive ANA, positive double-stranded DNA, dry mouth, polyarthralgia: -Repeat double-stranded DNA was negative: Patient is currently taking Plaquenil  200 mg 1 tablet by mouth twice daily.  She was started on methotrexate  after her last office visit and was taking 4 tablets by mouth once weekly and folic acid  1 mg daily.  She ran out of the prescription 1 week ago due to requiring updated lab work.  Patient does not currently have insurance so she has self-pay--CBC and CMP will be obtained today.  If labs are stable she will increase methotrexate  to 6 tablets weekly with close monitoring. She has not yet noticed any clinical benefit since initiating methotrexate  but is willing to try increasing the dose as discussed above. Plan to obtain records from [facility]-she has been experiencing eye pain, redness, and irritation off-and-on for the past 1 month-discussed the concern for uveitis or scleritis. Lab work on 03/10/24 was reviewed today in the office: dsDNA 97, complements  WNL, ESR WNL, ANA 1:1280NS, ESR and CRP WNL.  Plan to update CBC and CMP today.  Plan to hold off on obtaining additional autoimmune lab work until she has insurance.  She will remain on Plaquenil  as prescribed.  She was advised to notify us  if she develops any new or worsening symptoms. - Plan: CBC with Differential/Platelet, Comprehensive metabolic panel with GFR  High risk medication use Plaquenil  200 mg 1 tablet by mouth twice daily. Plaquenil  was initiated in November 2023.  PLQ Eye Exam: 07/30/2022.  Plan to increase methotrexate  to 6 tablets weekly pending lab results. CBC and CMP updated on 03/10/24.  Orders for CBC and CMP released today.  Her next lab work will be due in 2 weeks after increasing the dose of methotrexate . No recurrent infections. - Plan: CBC with Differential/Platelet, Comprehensive metabolic panel with GFR  Eye pain, bilateral: Patient reports for the past 1 month she has been experiencing increased eye redness, irritation, and eye pain.  Patient was evaluated at [facility] and was told that she had inflammation in both eyes likely due to underlying lupus.  Plan to obtain records to review.  She was unsure if she has been diagnosed with uveitis or scleritis.  Discussed that if so the treatment is methotrexate .  She will be resuming methotrexate  as discussed above.  Chronic pain of both shoulders: She has good ROM of both shoulders.  She continues to experience intermittent discomfort in both shoulders.  Primary osteoarthritis of both hands: PIP and DIP thickening consistent with osteoarthritis of both hands.  No synovitis noted.  She continues to  have pain and stiffness in both hands.  Chondromalacia patellae, right knee: No warmth or effusion.  She has intermittent discomfort in both knees especially when climbing steps.  Plantar fasciitis - S/p surgical intervention for Plantar fasciitis by Dr. Gershon on April 17, 2022.  Surgical scar healed.  Arthropathy of  cervical facet joint:  Intermittent neck pain and stiffness.    Degeneration of intervertebral disc of lumbar region without discogenic back pain or lower extremity pain: No symptoms of radiculopathy.  Other secondary scoliosis, lumbar region  Age-related osteoporosis without current pathological fracture - On Zometa  infusions by her oncologist.  Other medical conditions are listed as follows:   Malignant neoplasm of upper-inner quadrant of right breast in female, estrogen receptor positive (HCC)  Acquired absence of right breast  Basal cell carcinoma (BCC) of skin of other part of torso  Family history of melanoma-mother  Family history of colon cancer-paternal grandfather, paternal aunt, paternal uncle  Family history of prostate cancer-father  Orders: Orders Placed This Encounter  Procedures   CBC with Differential/Platelet   Comprehensive metabolic panel with GFR   No orders of the defined types were placed in this encounter.    Follow-Up Instructions: Return in about 2 months (around 07/09/2024).   Waddell CHRISTELLA Craze, PA-C  Note - This record has been created using Dragon software.  Chart creation errors have been sought, but may not always  have been located. Such creation errors do not reflect on  the standard of medical care.

## 2024-05-02 ENCOUNTER — Encounter: Payer: Self-pay | Admitting: Hematology

## 2024-05-09 ENCOUNTER — Ambulatory Visit: Payer: Self-pay | Attending: Physician Assistant | Admitting: Physician Assistant

## 2024-05-09 ENCOUNTER — Encounter: Payer: Self-pay | Admitting: Physician Assistant

## 2024-05-09 VITALS — BP 111/65 | HR 54 | Temp 96.8°F | Resp 16 | Ht 64.0 in | Wt 168.6 lb

## 2024-05-09 DIAGNOSIS — M25512 Pain in left shoulder: Secondary | ICD-10-CM

## 2024-05-09 DIAGNOSIS — C50211 Malignant neoplasm of upper-inner quadrant of right female breast: Secondary | ICD-10-CM

## 2024-05-09 DIAGNOSIS — M359 Systemic involvement of connective tissue, unspecified: Secondary | ICD-10-CM

## 2024-05-09 DIAGNOSIS — Z800 Family history of malignant neoplasm of digestive organs: Secondary | ICD-10-CM

## 2024-05-09 DIAGNOSIS — M2241 Chondromalacia patellae, right knee: Secondary | ICD-10-CM

## 2024-05-09 DIAGNOSIS — M722 Plantar fascial fibromatosis: Secondary | ICD-10-CM

## 2024-05-09 DIAGNOSIS — Z808 Family history of malignant neoplasm of other organs or systems: Secondary | ICD-10-CM

## 2024-05-09 DIAGNOSIS — M810 Age-related osteoporosis without current pathological fracture: Secondary | ICD-10-CM

## 2024-05-09 DIAGNOSIS — M19042 Primary osteoarthritis, left hand: Secondary | ICD-10-CM

## 2024-05-09 DIAGNOSIS — G8929 Other chronic pain: Secondary | ICD-10-CM

## 2024-05-09 DIAGNOSIS — M4156 Other secondary scoliosis, lumbar region: Secondary | ICD-10-CM

## 2024-05-09 DIAGNOSIS — H5713 Ocular pain, bilateral: Secondary | ICD-10-CM

## 2024-05-09 DIAGNOSIS — Z170 Estrogen receptor positive status [ER+]: Secondary | ICD-10-CM

## 2024-05-09 DIAGNOSIS — M25511 Pain in right shoulder: Secondary | ICD-10-CM

## 2024-05-09 DIAGNOSIS — M19041 Primary osteoarthritis, right hand: Secondary | ICD-10-CM

## 2024-05-09 DIAGNOSIS — Z9011 Acquired absence of right breast and nipple: Secondary | ICD-10-CM

## 2024-05-09 DIAGNOSIS — C44519 Basal cell carcinoma of skin of other part of trunk: Secondary | ICD-10-CM

## 2024-05-09 DIAGNOSIS — M51369 Other intervertebral disc degeneration, lumbar region without mention of lumbar back pain or lower extremity pain: Secondary | ICD-10-CM

## 2024-05-09 DIAGNOSIS — Z8042 Family history of malignant neoplasm of prostate: Secondary | ICD-10-CM

## 2024-05-09 DIAGNOSIS — M47812 Spondylosis without myelopathy or radiculopathy, cervical region: Secondary | ICD-10-CM

## 2024-05-09 DIAGNOSIS — Z79899 Other long term (current) drug therapy: Secondary | ICD-10-CM

## 2024-05-09 NOTE — Patient Instructions (Addendum)
 Standing Labs We placed an order today for your standing lab work.   Please have your standing labs drawn in 2 weeks extremity  Please have your labs drawn 2 weeks prior to your appointment so that the provider can discuss your lab results at your appointment, if possible.  Please note that you may see your imaging and lab results in MyChart before we have reviewed them. We will contact you once all results are reviewed. Please allow our office up to 72 hours to thoroughly review all of the results before contacting the office for clarification of your results.  WALK-IN LAB HOURS  Monday through Thursday from 8:00 am -12:30 pm and 1:00 pm-4:30 pm and Friday from 8:00 am-12:00 pm.  Patients with office visits requiring labs will be seen before walk-in labs.  You may encounter longer than normal wait times. Please allow additional time. Wait times may be shorter on  Monday and Thursday afternoons.  We do not book appointments for walk-in labs. We appreciate your patience and understanding with our staff.   Labs are drawn by [facility]. Please bring your co-pay at the time of your lab draw.  You may receive a bill from [facility] for your lab work.  Please note if you are on Hydroxychloroquine  and and an order has been placed for a Hydroxychloroquine  level,  you will need to have it drawn 4 hours or more after your last dose.  If you wish to have your labs drawn at another location, please call the office 24 hours in advance so we can fax the orders.  The office is located at [address], [address], [place], [place] [address]   If you have any questions regarding directions or hours of operation,  please call [phone].   As a reminder, please drink plenty of water prior to coming for your lab work. Thanks!

## 2024-05-10 ENCOUNTER — Ambulatory Visit: Payer: Self-pay | Admitting: Physician Assistant

## 2024-05-10 LAB — COMPREHENSIVE METABOLIC PANEL WITH GFR
AG Ratio: 2 (calc) (ref 1.0–2.5)
ALT: 12 U/L (ref 6–29)
AST: 16 U/L (ref 10–35)
Albumin: 4.7 g/dL (ref 3.6–5.1)
Alkaline phosphatase (APISO): 80 U/L (ref 37–153)
BUN: 14 mg/dL (ref 7–25)
CO2: 30 mmol/L (ref 20–32)
Calcium: 9.7 mg/dL (ref 8.6–10.4)
Chloride: 103 mmol/L (ref 98–110)
Creat: 0.61 mg/dL (ref 0.50–1.03)
Globulin: 2.3 g/dL (ref 1.9–3.7)
Glucose, Bld: 95 mg/dL (ref 65–99)
Potassium: 3.8 mmol/L (ref 3.5–5.3)
Sodium: 140 mmol/L (ref 135–146)
Total Bilirubin: 0.5 mg/dL (ref 0.2–1.2)
Total Protein: 7 g/dL (ref 6.1–8.1)
eGFR: 104 mL/min/1.73m2 (ref >=60)

## 2024-05-10 LAB — CBC WITH DIFFERENTIAL/PLATELET
Absolute Lymphocytes: 2049 {cells}/uL (ref 850–3900)
Absolute Monocytes: 406 {cells}/uL (ref 200–950)
Basophils Absolute: 21 {cells}/uL (ref 0–200)
Basophils Relative: 0.4 %
Eosinophils Absolute: 99 {cells}/uL (ref 15–500)
Eosinophils Relative: 1.9 %
HCT: 36.7 % (ref 35.0–45.0)
Hemoglobin: 13.1 g/dL (ref 11.7–15.5)
MCH: 33.1 pg — ABNORMAL HIGH (ref 27.0–33.0)
MCHC: 35.7 g/dL (ref 32.0–36.0)
MCV: 92.7 fL (ref 80.0–100.0)
MPV: 10.5 fL (ref 7.5–12.5)
Monocytes Relative: 7.8 %
Neutro Abs: 2626 {cells}/uL (ref 1500–7800)
Neutrophils Relative %: 50.5 %
Platelets: 204 Thousand/uL (ref 140–400)
RBC: 3.96 Million/uL (ref 3.80–5.10)
RDW: 13.1 % (ref 11.0–15.0)
Total Lymphocyte: 39.4 %
WBC: 5.2 Thousand/uL (ref 3.8–10.8)

## 2024-05-10 MED ORDER — METHOTREXATE SODIUM 2.5 MG PO TABS: 15.0000 mg | ORAL_TABLET | ORAL | 0 refills | Status: AC

## 2024-05-10 MED ORDER — FOLIC ACID 1 MG PO TABS: 2.0000 mg | ORAL_TABLET | Freq: Every day | ORAL | 3 refills | Status: AC

## 2024-05-10 NOTE — Progress Notes (Signed)
 CMP WNL CBC WNL. Ok to increase methotrexate  to 6 tablets weekly and folic acid  2 mg daily.  Please send refill of methotrexate 

## 2024-05-30 ENCOUNTER — Encounter: Payer: Self-pay | Admitting: Hematology

## 2024-06-22 ENCOUNTER — Telehealth: Payer: Self-pay | Admitting: Rheumatology

## 2024-06-22 NOTE — Telephone Encounter (Signed)
 Pt stating that the medication (methotrexate ) has been making her vision blurry especially at night. Pt stated she is going to stop taking the medication and wanted Dr. Dolphus to know.

## 2024-06-27 NOTE — Progress Notes (Unsigned)
 Office Visit Note  Patient: Claire Guzman             Date of Birth: August 01, 1965           MRN: [id number]             PCP: Adine Duwaine Credit, MD Referring: Adine Duwaine Credit, MD Visit Date: 07/11/2024 Occupation: Data Unavailable  Subjective:  No chief complaint on file.   History of Present Illness: Claire Guzman is a 58 y.o. female ***     Activities of Daily Living:  Patient reports morning stiffness for *** {minute/hour:19697}.   Patient {ACTIONS;DENIES/REPORTS:21021675::Denies} nocturnal pain.  Difficulty dressing/grooming: {ACTIONS;DENIES/REPORTS:21021675::Denies} Difficulty climbing stairs: {ACTIONS;DENIES/REPORTS:21021675::Denies} Difficulty getting out of chair: {ACTIONS;DENIES/REPORTS:21021675::Denies} Difficulty using hands for taps, buttons, cutlery, and/or writing: {ACTIONS;DENIES/REPORTS:21021675::Denies}  No Rheumatology ROS completed.   PMFS History:  Patient Active Problem List   Diagnosis Date Noted   Osteoporosis 11/30/2023   Elevated troponin 11/13/2022   Essential hypertension 11/13/2022   GERD (gastroesophageal reflux disease) 11/13/2022   GAD (generalized anxiety disorder) 11/13/2022   Chest pain 11/12/2022   Plantar fasciitis 12/12/2021   Heel spur, left 12/12/2021   Headache, acute 08/08/2021   Low back pain 05/14/2021   Acquired absence of right breast 09/22/2018   Port-A-Cath in place 05/11/2018   Genetic testing 04/12/2018   Family history of prostate cancer    Family history of colon cancer    Family history of melanoma    Malignant neoplasm of upper-inner quadrant of right breast in female, estrogen receptor positive (HCC) 03/10/2018    Past Medical History:  Diagnosis Date   Abdominal pain, unspecified site    Anxiety state, unspecified    Complication of anesthesia    convulsions - when she has appendectomy about 12 years ago at [facility]   Contact dermatitis and other eczema, due to unspecified  cause    Essential hypertension, benign    Family history of colon cancer    Family history of melanoma    Family history of prostate cancer    GERD (gastroesophageal reflux disease)    Hematuria, unspecified    High cholesterol    Hypertension    Lower back pain    Mixed hyperlipidemia    Personal history of chemotherapy    Personal history of radiation therapy    Rosacea    rt breast ca dx'd 01/2018   R breast cancer   Unspecified symptom associated with female genital organs     Family History  Problem Relation Age of Onset   Heart failure Mother    Melanoma Mother 6       has had several removed over the past ten years   Atrial fibrillation Mother    Prostate cancer Father 29   Stroke Father    Throat cancer Father    Healthy Daughter    Heart Problems Maternal Aunt        d.60   Heart Problems Maternal Uncle        d.60   Colon cancer Paternal Aunt        dx 69s   Colon cancer Paternal Uncle        dx 70s   Heart attack Maternal Grandmother    Heart attack Maternal Grandfather        possible cancer, unknown type   Stroke Paternal Grandmother    Colon cancer Paternal Grandfather        dx upper 56s   Breast cancer Neg Hx  BRCA 1/2 Neg Hx    Past Surgical History:  Procedure Laterality Date   BREAST RECONSTRUCTION WITH PLACEMENT OF TISSUE EXPANDER AND ALLODERM Right 09/14/2018   Procedure: BREAST RECONSTRUCTION WITH PLACEMENT OF TISSUE EXPANDER AND ALLODERM;  Surgeon: Arelia Filippo, MD;  Location: [facility];  Service: Plastics;  Laterality: Right;   CESAREAN SECTION     DIAGNOSTIC LAPAROSCOPY     EYE SURGERY     Lasik   LAPAROSCOPIC APPENDECTOMY     LEFT HEART CATH AND CORONARY ANGIOGRAPHY N/A 11/13/2022   Procedure: LEFT HEART CATH AND CORONARY ANGIOGRAPHY;  Surgeon: Verlin Lonni BIRCH, MD;  Location: [facility];  Service: Cardiovascular;  Laterality: N/A;   LIPOSUCTION WITH LIPOFILLING Right 07/26/2019   Procedure: LIPOFILLING from abdomen  to R chest;  Surgeon: Arelia Filippo, MD;  Location: [facility];  Service: Plastics;  Laterality: Right;   MASTECTOMY Right    2019   MASTECTOMY WITH RADIOACTIVE SEED GUIDED EXCISION AND AXILLARY SENTINEL LYMPH NODE BIOPSY Right 09/14/2018   Procedure: RIGHT NIPPLE SPARING MASTECTOMY WITH TARGETED DEEP RIGHT AXILLARY LYMPH NODE BIOPSY WITH RADIOACTIVE SEED LOCALIZATION AND RIGHT AXILLARY SENTINEL LYMPH NODE BIOPSY, FROZEN SECTION AND POSSIBLE COMPLETION AXILLARY LYMPH NODE DISSECTION INJECT BLUE DYE RIGHT BREAST;  Surgeon: Gail Favorite, MD;  Location: [facility];  Service: General;  Laterality: Right;   PLANTAR FASCIA SURGERY Left 04/17/2022   PORT-A-CATH REMOVAL Left 07/26/2019   Procedure: REMOVAL PORT-A-CATH;  Surgeon: Arelia Filippo, MD;  Location: [facility];  Service: Plastics;  Laterality: Left;   PORTACATH PLACEMENT Left 04/13/2018   Procedure: INSERTION PORT-A-CATH WITH US ;  Surgeon: Gail Favorite, MD;  Location: [facility];  Service: General;  Laterality: Left;   REMOVAL OF TISSUE EXPANDER AND PLACEMENT OF IMPLANT Right 07/26/2019   Procedure: REMOVAL OF TISSUE EXPANDER AND PLACEMENT OF IMPLANT;  Surgeon: Arelia Filippo, MD;  Location: [facility];  Service: Plastics;  Laterality: Right;   UTERINE FIBROID SURGERY     Social History   Tobacco Use   Smoking status: Never    Passive exposure: Never   Smokeless tobacco: Never  Vaping Use   Vaping status: Never Used  Substance Use Topics   Alcohol use: No   Drug use: Never   Social History   Social History Narrative   Not on file     Immunization History  Administered Date(s) Administered   Influenza,inj,Quad PF,6+ Mos 05/11/2018, 05/16/2019, 05/04/2020   Moderna Sars-Covid-2 Vaccination 10/13/2019, 11/10/2019, 08/14/2020   Tdap 02/06/2022     Objective: Vital Signs: LMP 03/28/2018 (Within Days)    Physical Exam   Musculoskeletal Exam: ***  CDAI  Exam: CDAI Score: -- Patient Global: --; Provider Global: -- Swollen: --; Tender: -- Joint Exam 07/11/2024   No joint exam has been documented for this visit   There is currently no information documented on the homunculus. Go to the Rheumatology activity and complete the homunculus joint exam.  Investigation: No additional findings.  Imaging: No results found.  Recent Labs: Lab Results  Component Value Date   WBC 5.2 05/09/2024   HGB 13.1 05/09/2024   PLT 204 05/09/2024   NA 140 05/09/2024   K 3.8 05/09/2024   CL 103 05/09/2024   CO2 30 05/09/2024   GLUCOSE 95 05/09/2024   BUN 14 05/09/2024   CREATININE 0.61 05/09/2024   BILITOT 0.5 05/09/2024   ALKPHOS 79 12/14/2023   AST 16 05/09/2024   ALT 12 05/09/2024   PROT  7.0 05/09/2024   ALBUMIN 4.7 12/14/2023   CALCIUM  9.7 05/09/2024   GFRAA >60 03/02/2020   QFTBGOLDPLUS NEGATIVE 02/03/2023    Speciality Comments: PLQ Eye Exam: 05/23/2024 WNL @ [facility] Follow up 1 year.   Procedures:  No procedures performed Allergies: Trazodone  and nefazodone, Metronidazole, Other, Paxil [paroxetine hcl], Sulfamethoxazole , Zolmitriptan, and Prednisone    Assessment / Plan:     Visit Diagnoses: Undifferentiated connective tissue disease  High risk medication use  Chronic pain of both shoulders  Primary osteoarthritis of both hands  Chondromalacia patellae, right knee  Plantar fasciitis  Arthropathy of cervical facet joint  Degeneration of intervertebral disc of lumbar region without discogenic back pain or lower extremity pain  Other secondary scoliosis, lumbar region  Age-related osteoporosis without current pathological fracture  Malignant neoplasm of upper-inner quadrant of right breast in female, estrogen receptor positive (HCC)  Acquired absence of right breast  Basal cell carcinoma (BCC) of skin of other part of torso  Family history of melanoma-mother  Family history of colon cancer-paternal  grandfather, paternal aunt, paternal uncle  Family history of prostate cancer-father  Orders: No orders of the defined types were placed in this encounter.  No orders of the defined types were placed in this encounter.   Face-to-face time spent with patient was *** minutes. Greater than 50% of time was spent in counseling and coordination of care.  Follow-Up Instructions: No follow-ups on file.   Waddell CHRISTELLA Craze, PA-C  Note - This record has been created using Dragon software.  Chart creation errors have been sought, but may not always  have been located. Such creation errors do not reflect on  the standard of medical care.

## 2024-06-29 ENCOUNTER — Telehealth: Payer: Self-pay | Admitting: Rheumatology

## 2024-06-29 NOTE — Telephone Encounter (Signed)
 Attempted to contact patient and left message on machine to advise patient that [employer] evaluation by ophthalmologist

## 2024-06-29 NOTE — Telephone Encounter (Signed)
 Patient called to let Waddell know that she has stopped taking the Methotrexate  medication.  Patient states ever since she increased her dosage to 6 tablets her eyesight has gotten worse.  Patient states her eyes are very dry and is having difficulty driving at night.

## 2024-06-29 NOTE — Telephone Encounter (Signed)
 Noted. Recommend evaluation by ophthalmologist

## 2024-07-11 ENCOUNTER — Encounter: Payer: Self-pay | Admitting: Physician Assistant

## 2024-07-11 ENCOUNTER — Encounter: Payer: Self-pay | Admitting: Hematology

## 2024-07-11 ENCOUNTER — Ambulatory Visit: Payer: Self-pay | Attending: Physician Assistant | Admitting: Physician Assistant

## 2024-07-11 VITALS — BP 145/91 | HR 64 | Temp 97.2°F | Resp 16 | Ht 64.0 in | Wt 167.3 lb

## 2024-07-11 DIAGNOSIS — M51369 Other intervertebral disc degeneration, lumbar region without mention of lumbar back pain or lower extremity pain: Secondary | ICD-10-CM | POA: Diagnosis not present

## 2024-07-11 DIAGNOSIS — M722 Plantar fascial fibromatosis: Secondary | ICD-10-CM | POA: Diagnosis not present

## 2024-07-11 DIAGNOSIS — Z808 Family history of malignant neoplasm of other organs or systems: Secondary | ICD-10-CM

## 2024-07-11 DIAGNOSIS — M19041 Primary osteoarthritis, right hand: Secondary | ICD-10-CM

## 2024-07-11 DIAGNOSIS — C50211 Malignant neoplasm of upper-inner quadrant of right female breast: Secondary | ICD-10-CM

## 2024-07-11 DIAGNOSIS — Z9011 Acquired absence of right breast and nipple: Secondary | ICD-10-CM

## 2024-07-11 DIAGNOSIS — G8929 Other chronic pain: Secondary | ICD-10-CM

## 2024-07-11 DIAGNOSIS — M19042 Primary osteoarthritis, left hand: Secondary | ICD-10-CM

## 2024-07-11 DIAGNOSIS — M359 Systemic involvement of connective tissue, unspecified: Secondary | ICD-10-CM

## 2024-07-11 DIAGNOSIS — M25511 Pain in right shoulder: Secondary | ICD-10-CM

## 2024-07-11 DIAGNOSIS — M2241 Chondromalacia patellae, right knee: Secondary | ICD-10-CM | POA: Diagnosis not present

## 2024-07-11 DIAGNOSIS — M4156 Other secondary scoliosis, lumbar region: Secondary | ICD-10-CM

## 2024-07-11 DIAGNOSIS — Z800 Family history of malignant neoplasm of digestive organs: Secondary | ICD-10-CM

## 2024-07-11 DIAGNOSIS — Z79899 Other long term (current) drug therapy: Secondary | ICD-10-CM | POA: Diagnosis not present

## 2024-07-11 DIAGNOSIS — Z8042 Family history of malignant neoplasm of prostate: Secondary | ICD-10-CM

## 2024-07-11 DIAGNOSIS — M47812 Spondylosis without myelopathy or radiculopathy, cervical region: Secondary | ICD-10-CM

## 2024-07-11 DIAGNOSIS — C44519 Basal cell carcinoma of skin of other part of trunk: Secondary | ICD-10-CM

## 2024-07-11 DIAGNOSIS — M810 Age-related osteoporosis without current pathological fracture: Secondary | ICD-10-CM

## 2024-07-11 DIAGNOSIS — Z170 Estrogen receptor positive status [ER+]: Secondary | ICD-10-CM

## 2024-07-11 DIAGNOSIS — M25512 Pain in left shoulder: Secondary | ICD-10-CM

## 2024-07-11 NOTE — Patient Instructions (Signed)
 Standing Labs We placed an order today for your standing lab work.   Please have your standing labs drawn in March   Please have your labs drawn 2 weeks prior to your appointment so that the provider can discuss your lab results at your appointment, if possible.  Please note that you may see your imaging and lab results in MyChart before we have reviewed them. We will contact you once all results are reviewed. Please allow our office up to 72 hours to thoroughly review all of the results before contacting the office for clarification of your results.  WALK-IN LAB HOURS  Monday through Thursday from 8:00 am - 4:30 pm and Friday from 8:00 am-12:00 pm.  Patients with office visits requiring labs will be seen before walk-in labs.  You may encounter longer than normal wait times. Please allow additional time. Wait times may be shorter on  Monday and Thursday afternoons.  We do not book appointments for walk-in labs. We appreciate your patience and understanding with our staff.   Labs are drawn by [facility]. Please bring your co-pay at the time of your lab draw.  You may receive a bill from [facility] for your lab work.  Please note if you are on Hydroxychloroquine  and and an order has been placed for a Hydroxychloroquine  level,  you will need to have it drawn 4 hours or more after your last dose.  If you wish to have your labs drawn at another location, please call the office 24 hours in advance so we can fax the orders.  The office is located at [address], [place], [place] [address]   If you have any questions regarding directions or hours of operation,  please call [phone].   As a reminder, please drink plenty of water prior to coming for your lab work. Thanks!

## 2024-12-12 ENCOUNTER — Inpatient Hospital Stay

## 2024-12-12 ENCOUNTER — Inpatient Hospital Stay: Admitting: Hematology

## 2024-12-14 ENCOUNTER — Ambulatory Visit: Admitting: Rheumatology
